# Patient Record
Sex: Female | Born: 1935
Health system: Southern US, Community
[De-identification: ages and names within clinical notes are randomized; demographics above are authoritative.]

## PROBLEM LIST (undated history)

## (undated) DIAGNOSIS — K8020 Calculus of gallbladder without cholecystitis without obstruction: Secondary | ICD-10-CM

## (undated) DIAGNOSIS — I75029 Atheroembolism of unspecified lower extremity: Secondary | ICD-10-CM

## (undated) DIAGNOSIS — F329 Major depressive disorder, single episode, unspecified: Secondary | ICD-10-CM

## (undated) DIAGNOSIS — I639 Cerebral infarction, unspecified: Secondary | ICD-10-CM

## (undated) DIAGNOSIS — E785 Hyperlipidemia, unspecified: Secondary | ICD-10-CM

## (undated) DIAGNOSIS — M1990 Unspecified osteoarthritis, unspecified site: Secondary | ICD-10-CM

## (undated) DIAGNOSIS — C169 Malignant neoplasm of stomach, unspecified: Secondary | ICD-10-CM

## (undated) DIAGNOSIS — I6529 Occlusion and stenosis of unspecified carotid artery: Secondary | ICD-10-CM

## (undated) DIAGNOSIS — E041 Nontoxic single thyroid nodule: Secondary | ICD-10-CM

## (undated) DIAGNOSIS — K449 Diaphragmatic hernia without obstruction or gangrene: Secondary | ICD-10-CM

## (undated) DIAGNOSIS — M79606 Pain in leg, unspecified: Secondary | ICD-10-CM

## (undated) DIAGNOSIS — R011 Cardiac murmur, unspecified: Secondary | ICD-10-CM

## (undated) DIAGNOSIS — I6521 Occlusion and stenosis of right carotid artery: Secondary | ICD-10-CM

## (undated) DIAGNOSIS — F419 Anxiety disorder, unspecified: Secondary | ICD-10-CM

## (undated) DIAGNOSIS — K219 Gastro-esophageal reflux disease without esophagitis: Secondary | ICD-10-CM

## (undated) DIAGNOSIS — H539 Unspecified visual disturbance: Secondary | ICD-10-CM

## (undated) DIAGNOSIS — C801 Malignant (primary) neoplasm, unspecified: Secondary | ICD-10-CM

## (undated) DIAGNOSIS — I10 Essential (primary) hypertension: Secondary | ICD-10-CM

## (undated) DIAGNOSIS — N183 Chronic kidney disease, stage 3 (moderate): Secondary | ICD-10-CM

## (undated) DIAGNOSIS — Z8619 Personal history of other infectious and parasitic diseases: Secondary | ICD-10-CM

## (undated) DIAGNOSIS — IMO0002 Reserved for concepts with insufficient information to code with codable children: Secondary | ICD-10-CM

## (undated) DIAGNOSIS — M8580 Other specified disorders of bone density and structure, unspecified site: Secondary | ICD-10-CM

## (undated) DIAGNOSIS — K649 Unspecified hemorrhoids: Secondary | ICD-10-CM

## (undated) DIAGNOSIS — E119 Type 2 diabetes mellitus without complications: Secondary | ICD-10-CM

## (undated) DIAGNOSIS — R32 Unspecified urinary incontinence: Secondary | ICD-10-CM

## (undated) DIAGNOSIS — E042 Nontoxic multinodular goiter: Secondary | ICD-10-CM

## (undated) DIAGNOSIS — I2510 Atherosclerotic heart disease of native coronary artery without angina pectoris: Secondary | ICD-10-CM

## (undated) DIAGNOSIS — I219 Acute myocardial infarction, unspecified: Secondary | ICD-10-CM

## (undated) DIAGNOSIS — K279 Peptic ulcer, site unspecified, unspecified as acute or chronic, without hemorrhage or perforation: Secondary | ICD-10-CM

## (undated) HISTORY — PX: EYE SURGERY: SHX253

## (undated) HISTORY — PX: CATARACT EXTRACTION: SUR2

## (undated) HISTORY — PX: DILATION AND CURETTAGE OF UTERUS: SHX78

## (undated) HISTORY — DX: Cardiac murmur, unspecified: R01.1

## (undated) HISTORY — DX: Atheroembolism of unspecified lower extremity: I75.029

## (undated) HISTORY — DX: Essential (primary) hypertension: I10

## (undated) HISTORY — PX: SQUAMOUS CELL CARCINOMA EXCISION: SHX2433

## (undated) HISTORY — DX: Type 2 diabetes mellitus without complications: E11.9

## (undated) HISTORY — PX: INCISIONAL HERNIA REPAIR: SHX193

## (undated) HISTORY — DX: Nontoxic single thyroid nodule: E04.1

## (undated) HISTORY — PX: OTHER SURGICAL HISTORY: SHX169

## (undated) HISTORY — DX: Diaphragmatic hernia without obstruction or gangrene: K44.9

## (undated) HISTORY — DX: Malignant neoplasm of stomach, unspecified: C16.9

## (undated) HISTORY — PX: PTCA: SHX146

## (undated) HISTORY — DX: Malignant (primary) neoplasm, unspecified: C80.1

## (undated) HISTORY — PX: APPENDECTOMY: SHX54

## (undated) HISTORY — DX: Peptic ulcer, site unspecified, unspecified as acute or chronic, without hemorrhage or perforation: K27.9

## (undated) HISTORY — PX: TONSILLECTOMY: SUR1361

## (undated) HISTORY — DX: Chronic kidney disease, stage 3 (moderate): N18.3

## (undated) HISTORY — DX: Other specified disorders of bone density and structure, unspecified site: M85.80

## (undated) HISTORY — DX: Gastro-esophageal reflux disease without esophagitis: K21.9

## (undated) HISTORY — PX: INGUINAL HERNIA REPAIR: SUR1180

## (undated) HISTORY — DX: Cerebral infarction, unspecified: I63.9

## (undated) HISTORY — DX: Occlusion and stenosis of right carotid artery: I65.21

## (undated) HISTORY — DX: Occlusion and stenosis of unspecified carotid artery: I65.29

## (undated) HISTORY — DX: Personal history of other infectious and parasitic diseases: Z86.19

## (undated) HISTORY — DX: Unspecified visual disturbance: H53.9

## (undated) HISTORY — PX: HEMORRHOID SURGERY: SHX153

## (undated) HISTORY — DX: Acute myocardial infarction, unspecified: I21.9

## (undated) HISTORY — DX: Atherosclerotic heart disease of native coronary artery without angina pectoris: I25.10

## (undated) HISTORY — DX: Calculus of gallbladder without cholecystitis without obstruction: K80.20

## (undated) HISTORY — DX: Unspecified osteoarthritis, unspecified site: M19.90

## (undated) HISTORY — DX: Reserved for concepts with insufficient information to code with codable children: IMO0002

## (undated) HISTORY — DX: Unspecified hemorrhoids: K64.9

## (undated) HISTORY — PX: ENDOMETRIAL BIOPSY: SHX622

## (undated) HISTORY — DX: Hyperlipidemia, unspecified: E78.5

## (undated) HISTORY — DX: Pain in leg, unspecified: M79.606

## (undated) HISTORY — DX: Major depressive disorder, single episode, unspecified: F32.9

## (undated) HISTORY — DX: Nontoxic multinodular goiter: E04.2

---

## 1995-03-15 HISTORY — PX: CORONARY ANGIOPLASTY: SHX604

## 1997-05-14 ENCOUNTER — Encounter (HOSPITAL_COMMUNITY): Admission: RE | Admit: 1997-05-14 | Discharge: 1997-08-12 | Payer: Self-pay | Admitting: Cardiovascular Disease

## 1997-05-23 ENCOUNTER — Ambulatory Visit (HOSPITAL_COMMUNITY): Admission: RE | Admit: 1997-05-23 | Discharge: 1997-05-23 | Payer: Self-pay

## 1997-09-04 ENCOUNTER — Ambulatory Visit (HOSPITAL_COMMUNITY): Admission: RE | Admit: 1997-09-04 | Discharge: 1997-09-04 | Payer: Self-pay | Admitting: Orthopaedic Surgery

## 1997-09-16 ENCOUNTER — Encounter: Admission: RE | Admit: 1997-09-16 | Discharge: 1997-12-15 | Payer: Self-pay | Admitting: Orthopaedic Surgery

## 1997-10-05 ENCOUNTER — Encounter: Admission: RE | Admit: 1997-10-05 | Discharge: 1998-01-03 | Payer: Self-pay | Admitting: Radiation Oncology

## 1998-02-11 HISTORY — PX: CORONARY STENT PLACEMENT: SHX1402

## 1998-05-02 ENCOUNTER — Ambulatory Visit (HOSPITAL_COMMUNITY): Admission: RE | Admit: 1998-05-02 | Discharge: 1998-05-02 | Payer: Self-pay | Admitting: Obstetrics and Gynecology

## 1998-05-02 ENCOUNTER — Encounter: Payer: Self-pay | Admitting: Obstetrics and Gynecology

## 1998-05-04 ENCOUNTER — Other Ambulatory Visit: Admission: RE | Admit: 1998-05-04 | Discharge: 1998-05-04 | Payer: Self-pay | Admitting: Obstetrics and Gynecology

## 1998-05-15 ENCOUNTER — Observation Stay (HOSPITAL_COMMUNITY): Admission: AD | Admit: 1998-05-15 | Discharge: 1998-05-16 | Payer: Self-pay | Admitting: Cardiology

## 1998-05-15 ENCOUNTER — Ambulatory Visit (HOSPITAL_BASED_OUTPATIENT_CLINIC_OR_DEPARTMENT_OTHER): Admission: RE | Admit: 1998-05-15 | Discharge: 1998-05-15 | Payer: Self-pay | Admitting: Plastic Surgery

## 1998-05-19 ENCOUNTER — Observation Stay (HOSPITAL_COMMUNITY): Admission: AD | Admit: 1998-05-19 | Discharge: 1998-05-20 | Payer: Self-pay | Admitting: Cardiology

## 1998-05-19 HISTORY — PX: CORONARY ANGIOPLASTY WITH STENT PLACEMENT: SHX49

## 1998-05-30 ENCOUNTER — Encounter (HOSPITAL_COMMUNITY): Admission: RE | Admit: 1998-05-30 | Discharge: 1998-08-28 | Payer: Self-pay | Admitting: Cardiology

## 1998-09-12 ENCOUNTER — Encounter (HOSPITAL_COMMUNITY): Admission: RE | Admit: 1998-09-12 | Discharge: 1998-12-11 | Payer: Self-pay | Admitting: Cardiology

## 1998-12-15 ENCOUNTER — Encounter (INDEPENDENT_AMBULATORY_CARE_PROVIDER_SITE_OTHER): Payer: Self-pay

## 1998-12-15 ENCOUNTER — Other Ambulatory Visit: Admission: RE | Admit: 1998-12-15 | Discharge: 1998-12-15 | Payer: Self-pay | Admitting: Obstetrics and Gynecology

## 1999-06-11 ENCOUNTER — Encounter: Payer: Self-pay | Admitting: Obstetrics and Gynecology

## 1999-06-11 ENCOUNTER — Ambulatory Visit (HOSPITAL_COMMUNITY): Admission: RE | Admit: 1999-06-11 | Discharge: 1999-06-11 | Payer: Self-pay | Admitting: Obstetrics and Gynecology

## 1999-06-12 ENCOUNTER — Encounter: Admission: RE | Admit: 1999-06-12 | Discharge: 1999-06-12 | Payer: Self-pay | Admitting: Obstetrics and Gynecology

## 1999-06-12 ENCOUNTER — Encounter: Payer: Self-pay | Admitting: Obstetrics and Gynecology

## 1999-06-20 ENCOUNTER — Encounter: Payer: Self-pay | Admitting: Obstetrics and Gynecology

## 1999-06-20 ENCOUNTER — Encounter: Admission: RE | Admit: 1999-06-20 | Discharge: 1999-06-20 | Payer: Self-pay | Admitting: Obstetrics and Gynecology

## 1999-06-21 ENCOUNTER — Other Ambulatory Visit: Admission: RE | Admit: 1999-06-21 | Discharge: 1999-06-21 | Payer: Self-pay | Admitting: Obstetrics and Gynecology

## 1999-07-02 ENCOUNTER — Encounter: Payer: Self-pay | Admitting: Family Medicine

## 1999-07-02 ENCOUNTER — Ambulatory Visit: Admission: RE | Admit: 1999-07-02 | Discharge: 1999-07-02 | Payer: Self-pay | Admitting: Family Medicine

## 1999-08-24 ENCOUNTER — Encounter: Payer: Self-pay | Admitting: Obstetrics and Gynecology

## 1999-08-28 ENCOUNTER — Ambulatory Visit (HOSPITAL_COMMUNITY): Admission: RE | Admit: 1999-08-28 | Discharge: 1999-08-28 | Payer: Self-pay | Admitting: Obstetrics and Gynecology

## 1999-08-28 ENCOUNTER — Encounter (INDEPENDENT_AMBULATORY_CARE_PROVIDER_SITE_OTHER): Payer: Self-pay | Admitting: Specialist

## 1999-10-27 ENCOUNTER — Ambulatory Visit (HOSPITAL_COMMUNITY): Admission: RE | Admit: 1999-10-27 | Discharge: 1999-10-27 | Payer: Self-pay | Admitting: Family Medicine

## 1999-10-27 ENCOUNTER — Encounter: Payer: Self-pay | Admitting: Family Medicine

## 2000-01-16 ENCOUNTER — Encounter: Admission: RE | Admit: 2000-01-16 | Discharge: 2000-01-16 | Payer: Self-pay | Admitting: Neurosurgery

## 2000-01-16 ENCOUNTER — Encounter: Payer: Self-pay | Admitting: Neurosurgery

## 2000-01-18 ENCOUNTER — Encounter: Payer: Self-pay | Admitting: General Surgery

## 2000-01-18 ENCOUNTER — Ambulatory Visit (HOSPITAL_COMMUNITY): Admission: RE | Admit: 2000-01-18 | Discharge: 2000-01-18 | Payer: Self-pay | Admitting: General Surgery

## 2000-02-27 ENCOUNTER — Ambulatory Visit (HOSPITAL_COMMUNITY): Admission: RE | Admit: 2000-02-27 | Discharge: 2000-02-28 | Payer: Self-pay | Admitting: Surgery

## 2000-05-18 ENCOUNTER — Ambulatory Visit (HOSPITAL_COMMUNITY): Admission: RE | Admit: 2000-05-18 | Discharge: 2000-05-18 | Payer: Self-pay | Admitting: Neurosurgery

## 2000-05-18 ENCOUNTER — Encounter: Payer: Self-pay | Admitting: Neurosurgery

## 2000-06-02 ENCOUNTER — Encounter: Admission: RE | Admit: 2000-06-02 | Discharge: 2000-06-02 | Payer: Self-pay | Admitting: Neurosurgery

## 2000-06-02 ENCOUNTER — Encounter: Payer: Self-pay | Admitting: Neurosurgery

## 2000-06-18 ENCOUNTER — Encounter: Payer: Self-pay | Admitting: Neurosurgery

## 2000-06-18 ENCOUNTER — Encounter: Admission: RE | Admit: 2000-06-18 | Discharge: 2000-06-18 | Payer: Self-pay | Admitting: Neurosurgery

## 2000-07-01 ENCOUNTER — Encounter: Payer: Self-pay | Admitting: Neurosurgery

## 2000-07-01 ENCOUNTER — Encounter: Admission: RE | Admit: 2000-07-01 | Discharge: 2000-07-01 | Payer: Self-pay | Admitting: Neurosurgery

## 2000-07-23 ENCOUNTER — Ambulatory Visit (HOSPITAL_COMMUNITY): Admission: RE | Admit: 2000-07-23 | Discharge: 2000-07-23 | Payer: Self-pay | Admitting: Obstetrics and Gynecology

## 2000-07-23 ENCOUNTER — Encounter: Payer: Self-pay | Admitting: Obstetrics and Gynecology

## 2000-07-24 ENCOUNTER — Other Ambulatory Visit: Admission: RE | Admit: 2000-07-24 | Discharge: 2000-07-24 | Payer: Self-pay | Admitting: Obstetrics and Gynecology

## 2000-09-17 ENCOUNTER — Ambulatory Visit (HOSPITAL_COMMUNITY): Admission: RE | Admit: 2000-09-17 | Discharge: 2000-09-17 | Payer: Self-pay | Admitting: Obstetrics and Gynecology

## 2000-09-17 ENCOUNTER — Encounter: Payer: Self-pay | Admitting: Obstetrics and Gynecology

## 2001-07-27 ENCOUNTER — Ambulatory Visit (HOSPITAL_COMMUNITY): Admission: RE | Admit: 2001-07-27 | Discharge: 2001-07-27 | Payer: Self-pay | Admitting: Obstetrics and Gynecology

## 2001-07-27 ENCOUNTER — Encounter: Payer: Self-pay | Admitting: Obstetrics and Gynecology

## 2001-07-28 ENCOUNTER — Encounter: Payer: Self-pay | Admitting: Obstetrics and Gynecology

## 2001-07-28 ENCOUNTER — Encounter: Admission: RE | Admit: 2001-07-28 | Discharge: 2001-07-28 | Payer: Self-pay | Admitting: Obstetrics and Gynecology

## 2001-08-03 ENCOUNTER — Other Ambulatory Visit: Admission: RE | Admit: 2001-08-03 | Discharge: 2001-08-03 | Payer: Self-pay | Admitting: Obstetrics and Gynecology

## 2001-09-23 ENCOUNTER — Encounter: Payer: Self-pay | Admitting: Surgery

## 2001-09-28 ENCOUNTER — Ambulatory Visit (HOSPITAL_COMMUNITY): Admission: RE | Admit: 2001-09-28 | Discharge: 2001-09-29 | Payer: Self-pay | Admitting: Surgery

## 2001-11-19 ENCOUNTER — Encounter: Payer: Self-pay | Admitting: Gastroenterology

## 2002-04-13 ENCOUNTER — Ambulatory Visit (HOSPITAL_COMMUNITY): Admission: RE | Admit: 2002-04-13 | Discharge: 2002-04-13 | Payer: Self-pay | Admitting: *Deleted

## 2002-04-13 ENCOUNTER — Encounter: Payer: Self-pay | Admitting: *Deleted

## 2002-08-19 ENCOUNTER — Encounter: Payer: Self-pay | Admitting: Obstetrics and Gynecology

## 2002-08-19 ENCOUNTER — Encounter: Admission: RE | Admit: 2002-08-19 | Discharge: 2002-08-19 | Payer: Self-pay | Admitting: Obstetrics and Gynecology

## 2002-08-30 ENCOUNTER — Other Ambulatory Visit: Admission: RE | Admit: 2002-08-30 | Discharge: 2002-08-30 | Payer: Self-pay | Admitting: Obstetrics and Gynecology

## 2002-11-02 ENCOUNTER — Encounter: Payer: Self-pay | Admitting: Family Medicine

## 2002-11-02 ENCOUNTER — Ambulatory Visit (HOSPITAL_COMMUNITY): Admission: RE | Admit: 2002-11-02 | Discharge: 2002-11-02 | Payer: Self-pay | Admitting: Family Medicine

## 2003-04-22 ENCOUNTER — Ambulatory Visit (HOSPITAL_COMMUNITY): Admission: RE | Admit: 2003-04-22 | Discharge: 2003-04-22 | Payer: Self-pay | Admitting: Cardiology

## 2003-04-25 ENCOUNTER — Ambulatory Visit (HOSPITAL_COMMUNITY): Admission: RE | Admit: 2003-04-25 | Discharge: 2003-04-25 | Payer: Self-pay | Admitting: Cardiology

## 2003-04-25 ENCOUNTER — Encounter: Payer: Self-pay | Admitting: Cardiology

## 2003-05-27 ENCOUNTER — Ambulatory Visit (HOSPITAL_COMMUNITY): Admission: RE | Admit: 2003-05-27 | Discharge: 2003-05-27 | Payer: Self-pay | Admitting: *Deleted

## 2003-09-08 ENCOUNTER — Other Ambulatory Visit: Admission: RE | Admit: 2003-09-08 | Discharge: 2003-09-08 | Payer: Self-pay | Admitting: Obstetrics and Gynecology

## 2003-10-26 ENCOUNTER — Encounter: Admission: RE | Admit: 2003-10-26 | Discharge: 2003-10-26 | Payer: Self-pay | Admitting: Obstetrics and Gynecology

## 2003-12-15 ENCOUNTER — Ambulatory Visit: Payer: Self-pay | Admitting: Cardiology

## 2003-12-29 HISTORY — PX: CATARACT EXTRACTION: SUR2

## 2004-03-30 ENCOUNTER — Encounter: Admission: AD | Admit: 2004-03-30 | Discharge: 2004-03-30 | Payer: Self-pay | Admitting: Dentistry

## 2004-03-30 ENCOUNTER — Ambulatory Visit: Payer: Self-pay | Admitting: Dentistry

## 2004-05-14 ENCOUNTER — Ambulatory Visit (HOSPITAL_COMMUNITY): Admission: RE | Admit: 2004-05-14 | Discharge: 2004-05-14 | Payer: Self-pay | Admitting: Family Medicine

## 2004-05-30 ENCOUNTER — Ambulatory Visit: Payer: Self-pay | Admitting: Cardiology

## 2004-06-07 ENCOUNTER — Ambulatory Visit: Payer: Self-pay

## 2004-06-08 ENCOUNTER — Ambulatory Visit: Payer: Self-pay | Admitting: Dentistry

## 2004-07-13 ENCOUNTER — Ambulatory Visit: Payer: Self-pay | Admitting: Cardiology

## 2004-07-27 ENCOUNTER — Ambulatory Visit: Payer: Self-pay | Admitting: Dentistry

## 2004-09-11 ENCOUNTER — Other Ambulatory Visit: Admission: RE | Admit: 2004-09-11 | Discharge: 2004-09-11 | Payer: Self-pay | Admitting: Obstetrics and Gynecology

## 2004-11-12 ENCOUNTER — Encounter: Admission: RE | Admit: 2004-11-12 | Discharge: 2004-11-12 | Payer: Self-pay | Admitting: Obstetrics and Gynecology

## 2004-12-11 ENCOUNTER — Ambulatory Visit: Payer: Self-pay | Admitting: Cardiology

## 2005-01-11 ENCOUNTER — Ambulatory Visit: Payer: Self-pay | Admitting: Dentistry

## 2005-01-18 ENCOUNTER — Ambulatory Visit: Payer: Self-pay | Admitting: Cardiology

## 2005-02-20 ENCOUNTER — Ambulatory Visit: Payer: Self-pay | Admitting: Dentistry

## 2005-03-20 ENCOUNTER — Ambulatory Visit: Payer: Self-pay | Admitting: Dentistry

## 2005-03-25 ENCOUNTER — Ambulatory Visit (HOSPITAL_COMMUNITY): Admission: RE | Admit: 2005-03-25 | Discharge: 2005-03-25 | Payer: Self-pay

## 2005-07-15 ENCOUNTER — Ambulatory Visit: Payer: Self-pay | Admitting: Cardiology

## 2005-08-09 ENCOUNTER — Ambulatory Visit: Payer: Self-pay | Admitting: Dentistry

## 2005-09-25 ENCOUNTER — Other Ambulatory Visit: Admission: RE | Admit: 2005-09-25 | Discharge: 2005-09-25 | Payer: Self-pay | Admitting: Obstetrics and Gynecology

## 2005-09-26 ENCOUNTER — Ambulatory Visit (HOSPITAL_COMMUNITY): Admission: RE | Admit: 2005-09-26 | Discharge: 2005-09-26 | Payer: Self-pay | Admitting: Obstetrics and Gynecology

## 2005-10-07 ENCOUNTER — Ambulatory Visit: Payer: Self-pay | Admitting: Dentistry

## 2005-11-26 ENCOUNTER — Ambulatory Visit (HOSPITAL_COMMUNITY): Admission: RE | Admit: 2005-11-26 | Discharge: 2005-11-26 | Payer: Self-pay | Admitting: Family Medicine

## 2006-01-01 ENCOUNTER — Encounter: Admission: RE | Admit: 2006-01-01 | Discharge: 2006-01-01 | Payer: Self-pay | Admitting: Obstetrics and Gynecology

## 2006-01-20 ENCOUNTER — Ambulatory Visit: Payer: Self-pay | Admitting: Cardiology

## 2006-01-31 ENCOUNTER — Ambulatory Visit: Payer: Self-pay | Admitting: Cardiovascular Disease

## 2006-01-31 ENCOUNTER — Ambulatory Visit: Payer: Self-pay

## 2006-04-15 ENCOUNTER — Ambulatory Visit: Payer: Self-pay | Admitting: Cardiology

## 2006-04-15 ENCOUNTER — Inpatient Hospital Stay (HOSPITAL_COMMUNITY): Admission: AD | Admit: 2006-04-15 | Discharge: 2006-04-18 | Payer: Self-pay | Admitting: Cardiology

## 2006-04-16 ENCOUNTER — Encounter: Payer: Self-pay | Admitting: Cardiology

## 2006-05-05 ENCOUNTER — Ambulatory Visit: Payer: Self-pay | Admitting: Cardiology

## 2006-05-08 ENCOUNTER — Ambulatory Visit: Payer: Self-pay | Admitting: *Deleted

## 2006-06-06 ENCOUNTER — Ambulatory Visit: Payer: Self-pay | Admitting: Dentistry

## 2006-08-06 ENCOUNTER — Ambulatory Visit: Payer: Self-pay | Admitting: Cardiology

## 2006-08-06 LAB — CONVERTED CEMR LAB
CO2: 32 meq/L (ref 19–32)
Calcium: 9.1 mg/dL (ref 8.4–10.5)
Chloride: 105 meq/L (ref 96–112)
Cholesterol: 122 mg/dL (ref 0–200)
Creatinine, Ser: 0.7 mg/dL (ref 0.4–1.2)
GFR calc non Af Amer: 86 mL/min
Glucose, Bld: 78 mg/dL (ref 70–99)
HDL: 47.3 mg/dL (ref >39.0)
LDL Cholesterol: 52 mg/dL (ref 0–99)

## 2006-09-30 ENCOUNTER — Ambulatory Visit: Payer: Self-pay | Admitting: Gastroenterology

## 2006-10-07 ENCOUNTER — Ambulatory Visit (HOSPITAL_COMMUNITY): Admission: RE | Admit: 2006-10-07 | Discharge: 2006-10-07 | Payer: Self-pay | Admitting: Gastroenterology

## 2006-10-14 ENCOUNTER — Ambulatory Visit: Payer: Self-pay | Admitting: Vascular Surgery

## 2006-11-18 ENCOUNTER — Ambulatory Visit: Payer: Self-pay | Admitting: Gastroenterology

## 2006-11-18 DIAGNOSIS — K649 Unspecified hemorrhoids: Secondary | ICD-10-CM | POA: Insufficient documentation

## 2006-12-04 ENCOUNTER — Ambulatory Visit: Payer: Self-pay | Admitting: Cardiology

## 2007-01-07 ENCOUNTER — Encounter: Admission: RE | Admit: 2007-01-07 | Discharge: 2007-01-07 | Payer: Self-pay | Admitting: Obstetrics and Gynecology

## 2007-02-02 ENCOUNTER — Ambulatory Visit: Payer: Self-pay

## 2007-03-18 ENCOUNTER — Ambulatory Visit: Payer: Self-pay | Admitting: Dentistry

## 2007-04-30 ENCOUNTER — Ambulatory Visit: Payer: Self-pay | Admitting: *Deleted

## 2007-05-16 DIAGNOSIS — I10 Essential (primary) hypertension: Secondary | ICD-10-CM | POA: Insufficient documentation

## 2007-05-16 DIAGNOSIS — M949 Disorder of cartilage, unspecified: Secondary | ICD-10-CM

## 2007-05-16 DIAGNOSIS — M899 Disorder of bone, unspecified: Secondary | ICD-10-CM | POA: Insufficient documentation

## 2007-05-16 DIAGNOSIS — I252 Old myocardial infarction: Secondary | ICD-10-CM

## 2007-05-16 DIAGNOSIS — M129 Arthropathy, unspecified: Secondary | ICD-10-CM | POA: Insufficient documentation

## 2007-05-16 DIAGNOSIS — I2510 Atherosclerotic heart disease of native coronary artery without angina pectoris: Secondary | ICD-10-CM | POA: Insufficient documentation

## 2007-05-16 DIAGNOSIS — E119 Type 2 diabetes mellitus without complications: Secondary | ICD-10-CM

## 2007-05-16 DIAGNOSIS — I6529 Occlusion and stenosis of unspecified carotid artery: Secondary | ICD-10-CM | POA: Insufficient documentation

## 2007-05-18 ENCOUNTER — Ambulatory Visit (HOSPITAL_COMMUNITY): Admission: RE | Admit: 2007-05-18 | Discharge: 2007-05-18 | Payer: Self-pay | Admitting: Family Medicine

## 2007-06-03 ENCOUNTER — Ambulatory Visit: Payer: Self-pay | Admitting: Cardiology

## 2007-10-22 ENCOUNTER — Ambulatory Visit: Payer: Self-pay | Admitting: *Deleted

## 2007-12-28 ENCOUNTER — Ambulatory Visit: Payer: Self-pay | Admitting: Cardiology

## 2008-01-13 ENCOUNTER — Ambulatory Visit (HOSPITAL_COMMUNITY): Admission: RE | Admit: 2008-01-13 | Discharge: 2008-01-13 | Payer: Self-pay | Admitting: Family Medicine

## 2008-01-19 ENCOUNTER — Ambulatory Visit: Payer: Self-pay

## 2008-03-08 ENCOUNTER — Encounter: Admission: RE | Admit: 2008-03-08 | Discharge: 2008-03-08 | Payer: Self-pay | Admitting: Obstetrics and Gynecology

## 2008-03-22 ENCOUNTER — Other Ambulatory Visit: Admission: RE | Admit: 2008-03-22 | Discharge: 2008-03-22 | Payer: Self-pay | Admitting: Obstetrics and Gynecology

## 2008-03-30 ENCOUNTER — Ambulatory Visit: Payer: Self-pay | Admitting: Dentistry

## 2008-06-09 ENCOUNTER — Ambulatory Visit: Payer: Self-pay | Admitting: *Deleted

## 2008-06-17 ENCOUNTER — Encounter: Payer: Self-pay | Admitting: Cardiology

## 2008-06-18 DIAGNOSIS — Z9849 Cataract extraction status, unspecified eye: Secondary | ICD-10-CM

## 2008-06-18 DIAGNOSIS — I701 Atherosclerosis of renal artery: Secondary | ICD-10-CM | POA: Insufficient documentation

## 2008-06-18 DIAGNOSIS — Z9861 Coronary angioplasty status: Secondary | ICD-10-CM

## 2008-06-18 DIAGNOSIS — E785 Hyperlipidemia, unspecified: Secondary | ICD-10-CM | POA: Insufficient documentation

## 2008-06-18 DIAGNOSIS — Z8711 Personal history of peptic ulcer disease: Secondary | ICD-10-CM

## 2008-06-18 DIAGNOSIS — K219 Gastro-esophageal reflux disease without esophagitis: Secondary | ICD-10-CM

## 2008-06-18 DIAGNOSIS — Z9889 Other specified postprocedural states: Secondary | ICD-10-CM

## 2008-06-18 DIAGNOSIS — Z9089 Acquired absence of other organs: Secondary | ICD-10-CM

## 2008-06-20 ENCOUNTER — Ambulatory Visit: Payer: Self-pay | Admitting: Cardiology

## 2008-10-04 ENCOUNTER — Encounter: Payer: Self-pay | Admitting: Cardiology

## 2008-10-10 ENCOUNTER — Ambulatory Visit (HOSPITAL_COMMUNITY): Admission: RE | Admit: 2008-10-10 | Discharge: 2008-10-10 | Payer: Self-pay | Admitting: Family Medicine

## 2008-10-10 ENCOUNTER — Encounter: Payer: Self-pay | Admitting: Cardiology

## 2008-11-07 ENCOUNTER — Ambulatory Visit: Payer: Self-pay | Admitting: Surgery

## 2008-11-07 ENCOUNTER — Encounter: Payer: Self-pay | Admitting: Cardiology

## 2008-12-26 ENCOUNTER — Encounter (INDEPENDENT_AMBULATORY_CARE_PROVIDER_SITE_OTHER): Payer: Self-pay | Admitting: *Deleted

## 2009-01-03 ENCOUNTER — Ambulatory Visit: Payer: Self-pay | Admitting: Cardiology

## 2009-01-25 ENCOUNTER — Ambulatory Visit: Payer: Self-pay

## 2009-01-25 ENCOUNTER — Encounter: Payer: Self-pay | Admitting: Cardiology

## 2009-03-20 ENCOUNTER — Encounter: Admission: RE | Admit: 2009-03-20 | Discharge: 2009-03-20 | Payer: Self-pay | Admitting: Obstetrics and Gynecology

## 2009-03-23 ENCOUNTER — Other Ambulatory Visit: Admission: RE | Admit: 2009-03-23 | Discharge: 2009-03-23 | Payer: Self-pay | Admitting: Obstetrics and Gynecology

## 2009-05-01 ENCOUNTER — Ambulatory Visit: Payer: Self-pay | Admitting: Surgery

## 2009-09-14 ENCOUNTER — Ambulatory Visit (HOSPITAL_COMMUNITY): Admission: RE | Admit: 2009-09-14 | Discharge: 2009-09-14 | Payer: Self-pay | Admitting: Legal Medicine

## 2009-09-14 ENCOUNTER — Encounter (INDEPENDENT_AMBULATORY_CARE_PROVIDER_SITE_OTHER): Payer: Self-pay | Admitting: Family Medicine

## 2009-09-14 ENCOUNTER — Ambulatory Visit: Payer: Self-pay | Admitting: Surgery

## 2009-10-02 ENCOUNTER — Telehealth: Payer: Self-pay | Admitting: Cardiology

## 2009-10-03 ENCOUNTER — Ambulatory Visit (HOSPITAL_COMMUNITY): Admission: RE | Admit: 2009-10-03 | Discharge: 2009-10-03 | Payer: Self-pay | Admitting: Legal Medicine

## 2009-10-03 ENCOUNTER — Telehealth (INDEPENDENT_AMBULATORY_CARE_PROVIDER_SITE_OTHER): Payer: Self-pay | Admitting: *Deleted

## 2009-10-19 ENCOUNTER — Ambulatory Visit (HOSPITAL_COMMUNITY)
Admission: RE | Admit: 2009-10-19 | Discharge: 2009-10-19 | Payer: Self-pay | Source: Home / Self Care | Admitting: Legal Medicine

## 2009-10-23 ENCOUNTER — Ambulatory Visit: Payer: Self-pay | Admitting: Cardiology

## 2009-11-27 ENCOUNTER — Ambulatory Visit: Payer: Self-pay | Admitting: Surgery

## 2009-11-27 ENCOUNTER — Encounter: Payer: Self-pay | Admitting: Cardiology

## 2010-02-08 ENCOUNTER — Ambulatory Visit (HOSPITAL_COMMUNITY)
Admission: RE | Admit: 2010-02-08 | Discharge: 2010-02-08 | Payer: Self-pay | Source: Home / Self Care | Attending: Legal Medicine | Admitting: Legal Medicine

## 2010-03-15 NOTE — Assessment & Plan Note (Signed)
Summary: f5m   Visit Type:  6  mo f/u Primary Provider:  Reinaldo Meeker  CC:  edema/ankles...no other complaints today.  History of Present Illness: Still volunteering at cardiac rehab.   Staying active, and feeling well, except energy level is low.  No chest pain, or shortness of breath.  Current Medications (verified): 1)  Ramipril 2.5 Mg Caps (Ramipril) .... Take One Capsule By Mouth Twice Daily. 2)  Metoprolol Succinate 25 Mg Xr24h-Tab (Metoprolol Succinate) .... Take One Tablet By Mouth Daily 3)  Crestor 10 Mg Tabs (Rosuvastatin Calcium) .... Take One Tablet By Mouth Daily. 4)  Hydrochlorothiazide 25 Mg Tabs (Hydrochlorothiazide) .... Take One Half Tablet By Mouth Daily. 5)  Plavix 75 Mg Tabs (Clopidogrel Bisulfate) .Marland Kitchen.. 1 Tab Once Daily 6)  Metformin Hcl 500 Mg Tabs (Metformin Hcl) .... Tke Two Tablets Two Times A Day 7)  Cyclobenzaprine Hcl 5 Mg Tabs (Cyclobenzaprine Hcl) .... Take One Tablet Daily Up To 3 Daily As Needed 8)  Zoloft 25 Mg Tabs (Sertraline Hcl) .... Take 1 Tablet By Mouth Once A Day 9)  Nexium 40 Mg Cpdr (Esomeprazole Magnesium) .... Take 1 Capsule By Mouth Once A Day 10)  Premarin 0.625 Mg/gm Crea (Estrogens, Conjugated) .... Apply Weekly 11)  Coq10 100 Mg Caps (Coenzyme Q10) .... Take 1 Capsule By Mouth Once A Day 12)  Aspirin Ec 325 Mg Tbec (Aspirin) .... Take One Tablet By Mouth Daily 13)  Vitamin B-12 500 Mcg  Tabs (Cyanocobalamin) .... Take 1 Tablet By Mouth Two  Times A Day 14)  Centrum Silver  Chew (Multiple Vitamins-Minerals) .... Once Daily 15)  Nitrostat 0.4 Mg Subl (Nitroglycerin) .Marland Kitchen.. 1 Tablet Under Tongue At Onset of Chest Pain; You May Repeat Every 5 Minutes For Up To 3 Doses. 16)  Ciclopirox 8 % Soln (Ciclopirox) .... As Needed 17)  Vitamin D3 400 Unit Tabs (Cholecalciferol) .... Take 1 and 1/2 Tablet Daily  Allergies: 1)  ! Sulfa 2)  ! * Tranalast Study Drug 3)  ! * Contrast Dye  Past History:  Past Medical History: Last updated:  06/18/2008 Current Problems:  ATHEROEMBOLISM, HX OF "BLUE TOES" (ICD-445.89) MYOCARDIAL INFARCTION, ANTERIOR WALL, HX OF (ICD-410.10) PEPTIC ULCER DISEASE, HX OF (ICD-V12.71) GASTROESOPHAGEAL REFLUX DISEASE (ICD-530.81) HYPERLIPIDEMIA (ICD-272.4) RENAL ARTERY STENOSIS (ICD-440.1) HEMORRHOIDS (ICD-455.6) OSTEOPENIA (ICD-733.90) DM (ICD-250.00) CAROTID ARTERY DISEASE (ICD-433.10) ARTHRITIS (ICD-716.90) MYOCARDIAL INFARCTION, HX OF (ICD-412) CAD (ICD-414.00) HYPERTENSION (ICD-401.9)    Past Surgical History: Last updated: 06/18/2008 CATARACT EXTRACTIONS, BILATERAL, HX OF (ICD-V45.61) TONSILLECTOMY, HX OF (ICD-V45.79) PERCUTANEOUS TRANSLUMINAL CORONARY ANGIOPLASTY, HX OF (ICD-V45.82) HEMORRHOIDECTOMY, HX OF (ICD-V45.89) DILATION AND CURETTAGE, HX OF (ICD-V45.89) INGUINAL HERNIORRHAPHY, RIGHT, HX OF (ICD-V45.89) APPENDECTOMY, HX OF (ICD-V45.79)  Family History: Last updated: 06/18/2008 Family History of Diabetes: father Family History of Hypertension:  paternal side of family  Social History: Last updated: 06/18/2008 Retired  Widowed   Vital Signs:  Patient profile:   77 year old female Height:      64 inches Weight:      146.4 pounds BMI:     25.22 Pulse rate:   71 / minute Pulse rhythm:   irregular BP sitting:   108 / 54  (left arm) Cuff size:   large  Vitals Entered By: Julaine Hua, CMA (October 23, 2009 3:24 PM)  Physical Exam  General:  Well developed, well nourished, in no acute distress. Head:  normocephalic and atraumatic Eyes:  PERRLA/EOM intact; conjunctiva and lids normal. Lungs:  Clear bilaterally to auscultation and percussion. Heart:  PMI non  displaced.  Minimal SEM.  No DM.  No rub or significant gallop. Abdomen:  Bowel sounds positive; abdomen soft and non-tender without masses, organomegaly, or hernias noted. No hepatosplenomegaly. Pulses:  pulses normal in all 4 extremities Extremities:  No evidence of atheroemboli. Neurologic:  Alert and  oriented x 3.   EKG  Procedure date:  10/23/2009  Findings:      NSR with sinus arrhythmia.  Low voltage QRS.  Inferior MI, old.  Anterolateral MI, old.  No acute changes.  Impression & Recommendations:  Problem # 1:  CAD (ICD-414.00) Continues to remain stable. Mild fatigue, but no specific symptoms.  Continue to observe, and encourage activity.  Her updated medication list for this problem includes:    Ramipril 2.5 Mg Caps (Ramipril) .Marland Kitchen... Take one capsule by mouth twice daily.    Metoprolol Succinate 25 Mg Xr24h-tab (Metoprolol succinate) .Marland Kitchen... Take one tablet by mouth daily    Plavix 75 Mg Tabs (Clopidogrel bisulfate) .Marland Kitchen... 1 tab once daily    Aspirin Ec 325 Mg Tbec (Aspirin) .Marland Kitchen... Take one tablet by mouth daily    Nitrostat 0.4 Mg Subl (Nitroglycerin) .Marland Kitchen... 1 tablet under tongue at onset of chest pain; you may repeat every 5 minutes for up to 3 doses.  Problem # 2:  ATHEROEMBOLISM, HX OF "BLUE TOES" (ICD-445.89) No evidence of this on exam at this point.   Problem # 3:  HYPERLIPIDEMIA (B2193296.4) Being followed by Dr. Henrene Pastor at this point in time. Labs recently done.   Her updated medication list for this problem includes:    Crestor 10 Mg Tabs (Rosuvastatin calcium) .Marland Kitchen... Take one tablet by mouth daily.  Problem # 4:  RENAL ARTERY STENOSIS (ICD-440.1) No abdominal bruits.  Will follow up at some point.    Other Orders: EKG w/ Interpretation (93000)  Patient Instructions: 1)  Your physician recommends that you schedule a follow-up appointment in: 6 months with Dr. Lia Foyer 2)  Your physician recommends that you continue on your current medications as directed. Please refer to the Current Medication list given to you today.

## 2010-03-15 NOTE — Progress Notes (Signed)
  Pt Signed ROI, Copied doppler gave to Pt  Acadia [place]  October 03, 2009 12:50 PM

## 2010-03-15 NOTE — Letter (Signed)
Summary: [facility] Office Visit   [facility] Visit   Imported By: Sallee Provencal 12/20/2009 16:06:40  _____________________________________________________________________  External Attachment:    Type:   Image     Comment:   External Document

## 2010-03-15 NOTE — Progress Notes (Signed)
Summary: Crestor  Phone Note Call from Patient Call back at [phone]   Caller: Stacy [name] Summary of Call: Pt daughter call regarding medication Initial call taken by: Delsa Sale,  October 02, 2009 11:43 AM  Follow-up for Phone Call        I spoke with the pt's daughter and the pt c/o aching in left leg on 09/20/09.  The pt stopped Crestor 09/22/09 and her ache went away on 09/26/09.  The pt has tried Zetia, Lescol, Lipitor, Pravachol and Zocor.  The pt is having right leg and hip pain and this is due to spinal stenosis.  The pt is having an arterial doppler next week.  I instructed Stacy [name] to have the pt start Crestor 5mg  next week after her arterial doppler.  If the pt tolerates the 5mg  after 1 WEEK she will increase Crestor to 10mg  daily. The pt's PCP checked her cholesterol in July (chol 131, trig 145, HDL 46, LDL 56).  The pt's daughter will call the office and let us know if her symptoms return after restarting Crestor.  Follow-up by: Theodosia Quay, RN, BSN,  October 02, 2009 3:16 PM

## 2010-04-10 ENCOUNTER — Encounter: Payer: Self-pay | Admitting: Cardiology

## 2010-05-04 ENCOUNTER — Telehealth: Payer: Self-pay | Admitting: Cardiology

## 2010-05-04 NOTE — Telephone Encounter (Signed)
Paged daughter at [phone] and put in our number--will wait until we hear from her--nt

## 2010-05-04 NOTE — Telephone Encounter (Signed)
Pt's daughter calling stating pt experienced pain in right side of chest(breast) radiating to right side of neck into shoulder blade--no SOB, not constant,no change in meds,has NTG but did not use,lasted from Wednesday thru Thursday, and has not comeback--has appoint with [place] 05/29/10--advised to keep appoint--if pain comes back try NTG to see if gets relief and go to nearest ED--DAUGHTER AGREES--NT

## 2010-05-04 NOTE — Telephone Encounter (Addendum)
pls page dtr anita at [phone] , 55p wed pt started having pain in right breast that shot through to right shoulder blade off and on for about 24 hrs, no pain now and has appt 4-14, dtr would like to talk with nurse  Pager not working-pls call [phone]

## 2010-05-10 ENCOUNTER — Other Ambulatory Visit (HOSPITAL_COMMUNITY): Payer: Self-pay | Admitting: Legal Medicine

## 2010-05-10 DIAGNOSIS — R1011 Right upper quadrant pain: Secondary | ICD-10-CM

## 2010-05-16 ENCOUNTER — Ambulatory Visit (HOSPITAL_COMMUNITY)
Admission: RE | Admit: 2010-05-16 | Discharge: 2010-05-16 | Disposition: A | Discharge status: Home / Self Care | Payer: Medicare Other | Source: Ambulatory Visit | Attending: Legal Medicine | Admitting: Legal Medicine

## 2010-05-16 ENCOUNTER — Other Ambulatory Visit (HOSPITAL_COMMUNITY): Payer: Self-pay

## 2010-05-16 DIAGNOSIS — K8020 Calculus of gallbladder without cholecystitis without obstruction: Secondary | ICD-10-CM | POA: Insufficient documentation

## 2010-05-16 DIAGNOSIS — R1011 Right upper quadrant pain: Secondary | ICD-10-CM

## 2010-05-16 DIAGNOSIS — K7689 Other specified diseases of liver: Secondary | ICD-10-CM | POA: Insufficient documentation

## 2010-05-21 ENCOUNTER — Other Ambulatory Visit (HOSPITAL_COMMUNITY): Payer: Self-pay | Admitting: Legal Medicine

## 2010-05-21 DIAGNOSIS — K7689 Other specified diseases of liver: Secondary | ICD-10-CM

## 2010-05-25 ENCOUNTER — Other Ambulatory Visit (HOSPITAL_COMMUNITY): Payer: BLUE CROSS/BLUE SHIELD

## 2010-05-26 ENCOUNTER — Encounter: Payer: Self-pay | Admitting: Cardiology

## 2010-05-29 ENCOUNTER — Ambulatory Visit (INDEPENDENT_AMBULATORY_CARE_PROVIDER_SITE_OTHER): Payer: Medicare Other | Admitting: Cardiology

## 2010-05-29 ENCOUNTER — Encounter: Payer: Self-pay | Admitting: Cardiology

## 2010-05-29 VITALS — BP 92/52 | HR 75 | Ht 64.0 in | Wt 139.0 lb

## 2010-05-29 DIAGNOSIS — I252 Old myocardial infarction: Secondary | ICD-10-CM

## 2010-05-29 DIAGNOSIS — I10 Essential (primary) hypertension: Secondary | ICD-10-CM

## 2010-05-29 DIAGNOSIS — E785 Hyperlipidemia, unspecified: Secondary | ICD-10-CM

## 2010-05-29 DIAGNOSIS — I6529 Occlusion and stenosis of unspecified carotid artery: Secondary | ICD-10-CM

## 2010-05-29 DIAGNOSIS — I2510 Atherosclerotic heart disease of native coronary artery without angina pectoris: Secondary | ICD-10-CM

## 2010-05-29 NOTE — Patient Instructions (Signed)
Your physician recommends that you continue on your current medications as directed. Please refer to the Current Medication list given to you today.  Your physician wants you to follow-up in: 6 MONTHS. You will receive a reminder letter in the mail two months in advance. If you don't receive a letter, please call our office to schedule the follow-up appointment.  

## 2010-05-30 ENCOUNTER — Ambulatory Visit (HOSPITAL_COMMUNITY)
Admission: RE | Admit: 2010-05-30 | Discharge: 2010-05-30 | Disposition: A | Discharge status: Home / Self Care | Payer: Medicare Other | Source: Ambulatory Visit | Attending: Legal Medicine | Admitting: Legal Medicine

## 2010-05-30 DIAGNOSIS — K8020 Calculus of gallbladder without cholecystitis without obstruction: Secondary | ICD-10-CM | POA: Insufficient documentation

## 2010-05-30 DIAGNOSIS — K7689 Other specified diseases of liver: Secondary | ICD-10-CM

## 2010-05-30 MED ORDER — GADOBENATE DIMEGLUMINE 529 MG/ML IV SOLN
15.0000 mL | Freq: Once | INTRAVENOUS | Status: AC | PRN
  Administered 2010-05-30: 15 mL via INTRAVENOUS

## 2010-06-07 ENCOUNTER — Telehealth: Payer: Self-pay | Admitting: Cardiology

## 2010-06-07 MED ORDER — CLOPIDOGREL BISULFATE 75 MG PO TABS: 75.0000 mg | ORAL_TABLET | Freq: Every day | ORAL | Status: DC

## 2010-06-07 NOTE — Assessment & Plan Note (Signed)
Controlled and not elevated.  We have done renal US in the past------for RAS.  BP not markedly elevated, and of note Cr was recently normal.

## 2010-06-07 NOTE — Telephone Encounter (Signed)
Pt calling needs her plavix to be called in. Pt is going out of town.  [facility] [place] # [phone]

## 2010-06-07 NOTE — Assessment & Plan Note (Signed)
No definite new symptoms. Therefore continue the same.

## 2010-06-07 NOTE — Progress Notes (Signed)
HPI:  Stacy [name] is seen today in follow up, accompanied by her daughter.  She is an 77 year old female with known CAD with prior PCI.  She perhaps does not have quite as much energy as in the past, but has not had rest or serious exertional symptoms.  She is scheduled for an MRI tomorrow.  Labs done by Dr. Henrene Pastor were ok.  LDL was at target, and HgA1c was 6.5%.  Current Outpatient Prescriptions  Medication Sig Dispense Refill  . ALPRAZolam (NIRAVAM) 0.25 MG dissolvable tablet Take 0.25 mg by mouth daily.        Marland Kitchen aspirin 325 MG tablet Take 325 mg by mouth daily.        . calcium-vitamin D (OSCAL WITH D) 500-200 MG-UNIT per tablet Take 1 tablet by mouth daily.        . cholecalciferol (VITAMIN D) 400 UNITS TABS Take 200 Units by mouth daily.        . Coenzyme Q10 (COQ-10 PO) 1 tab po qd       . cyanocobalamin 500 MCG tablet Take 500 mcg by mouth 2 (two) times daily.        . cyclobenzaprine (FLEXERIL) 5 MG tablet Take 5 mg by mouth as needed.        Marland Kitchen esomeprazole (NEXIUM) 40 MG capsule Take 40 mg by mouth daily before breakfast.        . estrogens, conjugated, (PREMARIN) 0.625 MG tablet Take 0.625 mg by mouth daily. Take daily for 21 days then do not take for 7 days.       Marland Kitchen FLUTICASONE PROPIONATE, INHAL, IN Inhale into the lungs as directed.        . hydrochlorothiazide 25 MG tablet 1/2 tab po qd       . loratadine (CLARITIN) 10 MG tablet Take 10 mg by mouth daily.        . metFORMIN (GLUCOPHAGE) 500 MG tablet Take 500 mg by mouth 2 (two) times daily with a meal.        . metoprolol succinate (TOPROL-XL) 25 MG 24 hr tablet Take 25 mg by mouth daily.        . Multiple Vitamins-Minerals (CENTRUM SILVER PO) 1 tab po qd       . nitroGLYCERIN (NITROSTAT) 0.4 MG SL tablet Place 0.4 mg under the tongue every 5 (five) minutes as needed.        . ramipril (ALTACE) 2.5 MG capsule Take 2.5 mg by mouth 2 (two) times daily.       . rosuvastatin (CRESTOR) 10 MG tablet Take 10 mg by mouth daily.        Marland Kitchen  DISCONTD: clopidogrel (PLAVIX) 75 MG tablet Take 75 mg by mouth daily.        . clopidogrel (PLAVIX) 75 MG tablet Take 1 tablet (75 mg total) by mouth daily.  30 tablet  11    Allergies  Allergen Reactions  . Iohexol      Desc: HIVES- 13 HR PRE-MEDS ARE REQUIRED-ASM- 03/21/05,SULFA,ADHESIVE TAPE   . Sulfonamide Derivatives     REACTION: hives    Past Medical History  Diagnosis Date  . Atheroembolism   . Myocardial infarction   . Peptic ulcer disease   . GERD (gastroesophageal reflux disease)   . Hyperlipidemia   . Renal artery stenosis   . Hemorrhoids   . Osteopenia   . DM (diabetes mellitus)   . CAD (coronary artery disease)   . Arthritis   .  Myocardial infarction   . CAD (coronary artery disease)   . Hypertension     Past Surgical History  Procedure Date  . Cataract extraction   . Tonsillectomy   . Ptca   . Appendectomy     Family History  Problem Relation Age of Onset  . Diabetes    . Hypertension      History   Social History  . Marital Status: Widowed    Spouse Name: N/A    Number of Children: N/A  . Years of Education: N/A   Occupational History  . Not on file.   Social History Main Topics  . Smoking status: Former Research scientist (life sciences)  . Smokeless tobacco: Not on file  . Alcohol Use: Not on file  . Drug Use: Not on file  . Sexually Active: Not on file   Other Topics Concern  . Not on file   Social History Narrative  . No narrative on file    ROS: Please see the HPI.  All other systems reviewed and negative.  PHYSICAL EXAM:  BP 92/52  Pulse 75  Ht 5\' 4"  (1.626 m)  Wt 139 lb (63.05 kg)  BMI 23.86 kg/m2  General: Well developed, well nourished, in no acute distress. Head:  Normocephalic and atraumatic. Neck: no JVD Lungs: Clear to auscultation and percussion. Heart: Normal S1 and S2.  No murmur, rubs or gallops.  Abdomen:  Normal bowel sounds; soft; non tender; no organomegaly Pulses: Pulses normal in all 4 extremities. Extremities: No  clubbing or cyanosis. No edema.  No atheroembolic. Neurologic: Alert and oriented x 3.  EKG:  NSR.  Inferior MI, old.  Anterior MI, old.  Incomplete RBBB.  ASSESSMENT AND PLAN:

## 2010-06-07 NOTE — Assessment & Plan Note (Signed)
Was seen at [facility].  Annual follow up was warranted for AAA, and also carotid.

## 2010-06-07 NOTE — Assessment & Plan Note (Signed)
At target.

## 2010-06-26 NOTE — Assessment & Plan Note (Signed)
OFFICE VISIT   [name], Stacy [name]  DOB:  March 20, 1926                                       06/09/2008  [id number]   The patient returned to the office today for continued carotid  surveillance.  Carotid Doppler carried out today.  This reveals a right  internal carotid artery stenosis in the 60% to 79% range, in the lower  aspect of this range.  Patent left internal carotid artery without  significant stenosis.   Remains on aspirin and Plavix.  No recent hospitalizations.  Denies  sensory, motor or visual deficit.  No speech problems.   PHYSICAL EXAMINATION:  A well-appearing 77 year old female.  Alert and  oriented.  BP is 113/72, pulse 64, respirations 18 per minute.  No  carotid bruits.  Normal heart sounds without murmurs.  Chest:  Clear.  Cranial nerves intact.  Strength equal bilaterally.  Reflexes 1+.   The patient continues to do well without significant progression of  carotid disease.  We will plan follow-up again in 6 months with carotid  Doppler and abdominal ultrasound.   Stacy [name], M.D.  Electronically Signed   PGH/MEDQ  D:  06/09/2008  T:  06/10/2008  Job:  [id number]   cc:   [name], M.D.

## 2010-06-26 NOTE — Procedures (Signed)
CAROTID DUPLEX EXAM   INDICATION:  Follow up carotid artery disease.   HISTORY:  Diabetes:  Yes.  Cardiac:  MI.  Hypertension:  Yes.  Smoking:  Quit in 2000.  Previous Surgery:  No.  CV History:  No.  Amaurosis Fugax No, Paresthesias No, Hemiparesis No.                                       RIGHT             LEFT  Brachial systolic pressure:         120               118  Brachial Doppler waveforms:         WNL               WNL  Vertebral direction of flow:        Antegrade         Antegrade  DUPLEX VELOCITIES (cm/sec)  CCA peak systolic                   108 mid, 153 distal                 88  ECA peak systolic                   166               91  ICA peak systolic                   365               124 distal  ICA end diastolic                   96                24  PLAQUE MORPHOLOGY:                  Heterogenous      Homogenous  PLAQUE AMOUNT:                      Moderate          Mild  PLAQUE LOCATION:                    ICA, ECA          ICA   IMPRESSION:  1. Right internal carotid artery suggests 60-79% stenosis.  2. Left internal carotid artery suggests 40-59% stenosis.  3. Antegrade flow in bilateral vertebrals.   ___________________________________________  V. Leia Alf, MD   CB/MEDQ  D:  05/01/2009  T:  05/02/2009  Job:  [id number]

## 2010-06-26 NOTE — Assessment & Plan Note (Signed)
[facility] OFFICE NOTE   Stacy [name], Stacy [name]                         MRN:          [id number]  DATE:06/03/2007                            DOB:          1927/01/17    Stacy [name] is in for a follow-up visit.  She is doing quite well.  She  denies any ongoing chest pain.  She has not had any further events since  last seen.  Since I last saw her, she did have a total body scan done  March 19, 2007.  She was thought to have mildly reduced left  ventricular systolic function and mild mitral valve regurgitation.  She  apparently had carotids and thyroid as well, and she has seen Dr. Debria Garret  in the interim.  Dopplers of the lower extremities were normal. Venous  extremities were unremarkable as well.  She even had a lung exam and a  pelvic ultrasound, all of which apparently were unremarkable.  She has  had laboratory studies done by Dr. Debria Garret.  This reveals a cholesterol of  130, HDL of 51, and LDL of 50.  Creatinine clearance has been done, and  most recent laboratory studies have been repeated, showing a glucose of  107.  TSHs were normal.  LDL cholesterol was 60, and HDL of 48.  She has  since seen Dr. Amedeo Plenty, and this was on April 30, 2007.  She was thought  to have a 40%-59% right ICA stenosis, no significant left ICA stenosis,  and carotid disease was felt to remain stable.  A thyroid ultrasound was  ordered by Dr. Debria Garret, and this was dated May 18, 2007.b The followup of  the thyroid scan was felt to lead to a recommendation of repeat thyroid  ultrasound in 6 months under the direction of Dr. Debria Garret.  Her overall  activity level has been relatively good.  She does not really have much  limitation, nor does she have much significant shortness of breath.  She  would like to go to [place].  A last stress Myoview study and in 2006  revealed a gated ejection fraction of 59%, with apical akinesis.  That  is better than the  echo study done.  In December 2008, she had an  abdominal study revealing mild 1%-59% bilateral renal stenoses, not  hemodynamically significant.  Overall, the patient remains stable at the  present time.   MEDICATIONS:  1. Hydrochlorothiazide 25 mg 1/2 tablet daily.  2. Aspirin 325 mg daily.  3. Zetia 10 mg daily.  4. Plavix 75 mg daily.  5. Toprol 25 mg daily.  6. Altace 2.5 mg b.i.d.  7. Actonel 35 mg weekly.  8. Nexium 40 mg daily.  9. CoQ 10, 100 mg daily.  10.Crestor 10 mg daily.  11.Metformin 500 mg 2 q.p.m.  12.Zoloft 25 mg daily.  13.Multivitamin.  14.Citrucel.  15.B12.  16.Premarin 0.625 mg vaginal cream weekly.  17.Ciclopirox topical.  18.Cyclobenzaprine hydrochloride 5 mg every other day.   PHYSICAL EXAMINATION:  GENERAL:  She really is alert and oriented, in no  acute  distress.  VITAL SIGNS:  Blood pressure is 118/60, the pulse is 60 and regular.  LUNG FIELDS:  Clear to auscultation and percussion.  CARDIAC:  Rhythm is regular, with an S4 gallop.  EXTREMITIES:  There is no significant extremity edema.     EKG reveals ventricular rate of 69, sinus rhythm, with premature atrial  contractions, septal inferior infarct of indeterminate age, likely  representing her apical findings in the past.   IMPRESSION:  1. Coronary artery disease, status post two-vessel coronary artery      intervention, with prior angioplasty of the left anterior      descending, and stenting of the right coronary artery, with      continued patency demonstrated April 17, 2006.  2. Mild left ventricular dysfunction, with wall motion abnormality, as      previously noted.  3. History of peripheral petechiae or blanching of uncertain etiology.  4. History of mild bilateral renal artery stenosis, 0%-59%.  5. History of diabetes.   PLAN:  1. Return to clinic in 6 months.  2. Continue current medical regimen.  3. Thorough discussion of all of the findings above.     Stacy [name]. Lia Foyer,  MD, [facility]  Electronically Signed    TDS/MedQ  DD: 06/05/2007  DT: 06/05/2007  Job #: [id number]

## 2010-06-26 NOTE — Assessment & Plan Note (Signed)
OFFICE VISIT   CANDELL, Stacy [name]  DOB:  05/27/1926                                       04/30/2007  [id number]   The patient returns today for regular followup of her carotid disease.  She has been doing generally well all without major complaints.  Remains  on aspirin and Plavix.  No recent hospitalizations.  Some complaints of  loss of memory.  No visual disturbance.  No sensory or motor deficit.  No speech problems.   Medications were reviewed today per the chart.   Carotid Doppler evaluation continues to reveal a 40-59% right ICA  stenosis, no significant left ICA stenosis.  This is an unchanged study.  Antegrade vertebral flow bilaterally.   Abdominal ultrasound was also carried out at this time due to a  fullness, an abdominal pulsatile central fullness.  This reveals no  evidence of aneurysm with a maximal aortic diameter measuring 1.38 cm.   The patient appears generally well, alert and oriented.  BP is 110/69,  pulse 74 per minute and regular, respirations 16 per minute.  Carotids  reveal no bruits.  Cranial nerves intact.  Strength equal bilaterally.  Chest is clear without rales or rhonchi.  Normal heart sounds.  Abdomen  soft and nontender.  Central pulsatile fullness present.  Nontender.   Carotid disease remains quite stable and asymptomatic at this time.  We  will plan followup again in 6 months.  No evidence of abdominal aortic  aneurysm.   [name], M.D.  Electronically Signed   PGH/MEDQ  D:  04/30/2007  T:  05/01/2007  Job:  [id number]   cc:   [name], M.D.

## 2010-06-26 NOTE — Procedures (Signed)
DUPLEX ULTRASOUND OF ABDOMINAL AORTA   INDICATION:  Followup of abdomen/abdominal bruit.   HISTORY:  Diabetes:  Yes.  Cardiac:  MI in 1997.  Hypertension:  Yes.  Smoking:  No.  Connective Tissue Disorder:  Family History:  None.  Previous Surgery:   DUPLEX EXAM:         AP (cm)                   TRANSVERSE (cm)  Proximal             1.25 cm                   1.25 cm  Mid                  1.3 cm                    1.24 cm  Distal               1.25 cm                   1.22 cm  Right Iliac          0.94 cm                   1.07 cm  Left Iliac           1.04 cm                   1.02 cm   PREVIOUS:  Date:  AP:  TRANSVERSE:   IMPRESSION:  No evidence of abdominal aortic aneurysm noted.   ___________________________________________  P. Drucie Opitz, M.D.   MG/MEDQ  D:  04/30/2007  T:  05/01/2007  Job:  [id number]

## 2010-06-26 NOTE — Procedures (Signed)
CAROTID DUPLEX EXAM   INDICATION:  Followup of known carotid artery disease.   HISTORY:  Diabetes:  Yes.  Cardiac:  MI.  Hypertension:  No.  Smoking:  No.  Previous Surgery:  No.  CV History:  No.  Amaurosis Fugax No, Paresthesias No, Hemiparesis No                                       RIGHT             LEFT  Brachial systolic pressure:         118               118  Brachial Doppler waveforms:         Biphasic          Biphasic  Vertebral direction of flow:        Antegrade         Antegrade  DUPLEX VELOCITIES (cm/sec)  CCA peak systolic                   103               94  ECA peak systolic                   168               [id number]  ICA peak systolic                   230               [id number]  ICA end diastolic                   74                86  PLAQUE MORPHOLOGY:                  Calcified         None  PLAQUE AMOUNT:                      Moderate          None  PLAQUE LOCATION:                    Bif/ICA           None   IMPRESSION:  1. 60-79% right internal carotid artery stenosis.  2. Increased velocity of 235 cm/s noted at the right bulb.  3. Patent left internal carotid artery with no evidence of stenosis.   ___________________________________________  P. Drucie Opitz, M.D.   AC/MEDQ  D:  06/09/2008  T:  06/09/2008  Job:  [id number]

## 2010-06-26 NOTE — Assessment & Plan Note (Signed)
OFFICE VISIT   BOSER, Stacy [name]  DOB:  12-Jul-1926                                       10/22/2007  [id number]   The patient returns to the office today for continued routine  surveillance of her carotid disease.  She has no major complaints at  this time.  She remains on aspirin and Plavix.  No recent  hospitalizations.  Denies visual disturbance, sensory motor deficit.  No  problems with her memory.  No speech abnormalities.  No gait problems.   MEDICATIONS:  Reviewed and placed on the chart.   Carotid Doppler evaluation was carried out today and reveals mild  progression of velocities in right internal carotid artery measuring  223/61 cm/sec.  This is in the 60-79% stenosis range.  Mild disease  noted on the left side.   PHYSICAL EXAMINATION:  The patient appears generally well.  BP 104/68 in  the left arm, 123/78 in the right arm, pulse is 75 per minute.  Cranial  nerves intact.  Strength equal bilaterally.  Normal reflexes.  Heart  sounds normal without murmurs.  No carotid bruits.   The patient has stable carotid disease with mild progression noted in  the right side.  No further workup required at this time.  Continue  aspirin and Plavix.  Follow up in 6 months.   [name], M.D.  Electronically Signed   PGH/MEDQ  D:  10/22/2007  T:  10/23/2007  Job:  [id number]   cc:   [name], M.D.

## 2010-06-26 NOTE — Assessment & Plan Note (Signed)
Stacy [name] OFFICE NOTE   Stacy [name], Stacy [name]                         MRN:          [id number]  DATE:08/06/2006                            DOB:          December 24, 1926    Stacy [name] is really in for followup.  In general, she is doing well.  She denies any ongoing chest pain or significant shortness of breath.  She has not had any recurrent blue toe syndrome.  She has tolerated all  of this reasonably well.   Of note, she has mildly increased velocities in her renal arteries  bilaterally but less than 60% stenosis with normal renal sizes.  She is  on an ACE inhibitor but tolerates this quite well.  She has no current  symptomatology.  She did have one mildly elevated transaminase.   Her medications include:  1. Hydrochlorothiazide 25 mg daily.  2. Aspirin 325 mg daily.  3. Zetia 10 mg daily.  4. Plavix 75 mg daily.  5. Toprol-XL 25 mg daily.  6. Altace 2.5 mg p.o. b.i.d.  7. Calcium daily.  8. Actonel weekly.  9. Nexium 40 mg daily.  10.CoQ10 daily.  11.Crestor 10 mg daily.  12.Metformin 500 mg.  13.Zoloft 25 mg daily.  14.Multivitamin daily.  15.Citrucel 250 mg daily.  16.B12.   On physical examination, the blood pressure is 110/68, the pulse is 67,  weight is 141 pounds.  The lung fields are clear and the cardiac rhythm  is regular.   Recent laboratory studies included a potassium of 4.6, glucose of 111,  and SGPT of 44.  Recent 2-D echocardiogram was done at [facility] in March.  At that time, her ejection fraction was 55-60% with  hypokinesis of the distal septoapical wall.  There was grade 1 diastolic  dysfunction.  There was moderate tricuspid valve regurgitation and right  atrial dilatation.   Overall, the patient has done well and has not had any recurrent  dizziness.  She is currently doing well.  We will plan to see her back  in followup in approximately 3 months.  Should she  have any problems in  the interim, she is to call us.  Otherwise, she will stay on her current  medications.  We did discuss in some detail the Zetia today.  She has an  eye twitch which her daughter thinks may be related to this, but it is  very difficult to tell.   ADDENDUM:  EKG reveals normal sinus rhythm with anteroseptal and  inferior infarct of indeterminate age.  The Q's and the septal beats are  basically unchanged.     Loretha Brasil. Lia Foyer, MD, [facility]  Electronically Signed    TDS/MedQ  DD: 08/06/2006  DT: 08/07/2006  Job #: [id number]

## 2010-06-26 NOTE — Letter (Signed)
December 28, 2007    [name]  [address] [place] [address]  [place], [place]  [address]   RE:  Stacy [name], Stacy [name]  MRN:  [id number]  /  DOB:  09-24-1926   Dear Abbe Amsterdam:   I had the pleasure of seeing, [name], in the office today in  followup.  In general, she is doing well.  We discussed her  medications.  We reviewed her recent laboratory studies.  These were  compatible with a hemoglobin A1c is 6.9.  Her cholesterol, however, was  under good control with an LDL of 53.  Her vitamin D was at the lower  limits of normal.  She has had mild glucose elevation.  She has had no  chest pain.  She has had a minor degree of swelling in the left leg,  this has been noted in the past.   Today, on examination, she is a well-appearing female in no distress.  Blood pressure 110/64 and pulse 66.  Lung fields clear.  Cardiac rhythm is regular.   Her electrocardiogram demonstrates a normal sinus rhythm with anterior  infarct of indeterminate age and small inferior infarct as well.   Overall, this nice lady has continued to do extremely well.  I did not  change her to carvedilol as she is only on a very minor dose of  metoprolol, and she has a history of previous depression.  Moreover, we will get a renal ultrasound to reassess her renal arteries  and she is on an ACE inhibitor and does have some degree of bilateral  renal artery disease, although it is not appeared to be hemodynamically  significant.  I will see her back in followup in about 6 months.  Thanks  for allowing me to share in the care.    Sincerely,      Loretha Brasil. Lia Foyer, MD, [facility]  Electronically Signed    TDS/MedQ  DD: 12/28/2007  DT: 12/29/2007  Job #: [id number]

## 2010-06-26 NOTE — Procedures (Signed)
DUPLEX ULTRASOUND OF ABDOMINAL AORTA   INDICATION:  Follow up abdominal aortic bruit.   HISTORY:  Diabetes:  Yes.  Cardiac:  MI.  Hypertension:  Yes.  Smoking:  Previous.  Connective Tissue Disorder:  Family History:  No.  Previous Surgery:  No.   DUPLEX EXAM:         AP (cm)                   TRANSVERSE (cm)  Proximal             1.1 cm                    1.0 cm  Mid                  1.1 cm                    1.0 cm  Distal               1.2 cm                    0.98 cm  Right Iliac          0.6 cm                    0.9 cm  Left Iliac           0.9 cm                    0.98 cm   PREVIOUS:  Date:  AP:  TRANSVERSE:   IMPRESSION:  No evidence of abdominal aortic aneurysm noted.   ___________________________________________  V. Leia Alf, MD   CB/MEDQ  D:  05/01/2009  T:  05/02/2009  Job:  [id number]

## 2010-06-26 NOTE — Procedures (Signed)
CAROTID DUPLEX EXAM   INDICATION:  Followup carotid artery disease.   HISTORY:  Diabetes:  Yes.  Cardiac:  MI in 1997.  Hypertension:  Yes.  Smoking:  No.  Previous Surgery:  No.  CV History:  No.  Amaurosis Fugax No, Paresthesias No, Hemiparesis No                                       RIGHT             LEFT  Brachial systolic pressure:         122               120  Brachial Doppler waveforms:         Biphasic          Biphasic  Vertebral direction of flow:        Antegrade         Antegrade  DUPLEX VELOCITIES (cm/sec)  CCA peak systolic                   78                82  ECA peak systolic                   168               [id number]  ICA peak systolic                   186               73  ICA end diastolic                   56                23  PLAQUE MORPHOLOGY:                  Calcified         None  PLAQUE AMOUNT:                      Moderate          None  PLAQUE LOCATION:                    ICA/CCA           None   IMPRESSION:  1. Right ICA stenosis of 40-59%.  2. Left ICA shows no evidence of stenosis.  3. No significant changes from previous study.   ___________________________________________  P. Drucie Opitz, M.D.   AS/MEDQ  D:  04/30/2007  T:  04/30/2007  Job:  [id number]

## 2010-06-26 NOTE — Procedures (Signed)
CAROTID DUPLEX EXAM   INDICATION:  Followup carotid artery disease.   HISTORY:  Diabetes:  Yes.  Cardiac:  MI.  Hypertension:  Yes.  Smoking:  Quit in 2000.  Previous Surgery:  CV History:                                       RIGHT             LEFT  Brachial systolic pressure:         116               118  Brachial Doppler waveforms:         WNL               WNL  Vertebral direction of flow:        Antegrade         Antegrade  DUPLEX VELOCITIES (cm/sec)  CCA peak systolic                   65                70  ECA peak systolic                   140               78  ICA peak systolic                   176               [id number]  ICA end diastolic                   61                41  PLAQUE MORPHOLOGY:                  Heterogeneous     Homogeneous  PLAQUE AMOUNT:                      Moderate          Mild  PLAQUE LOCATION:                    ICA and ECA       ICA   IMPRESSION:  Bilateral 40%-59% stenosis within internal carotid  arteries.  Right internal carotid artery is high end of range.  Antegrade flow within vertebrals bilaterally.   ___________________________________________  V. Leia Alf, MD   OD/MEDQ  D:  11/27/2009  T:  11/27/2009  Job:  [id number]

## 2010-06-26 NOTE — Assessment & Plan Note (Signed)
OFFICE VISIT   [name], Stacy P  DOB:  [birth year]                                       11/27/2009  [id number]   Patient is an 77 year old female who returns today for follow-up.  She  has been treated for a long time by Dr. Amedeo Plenty, who has been following  her abdominal aneurysm and carotid stenosis.  She comes back today  without symptoms, specifically, no numbness or weakness, slurring of  speech, amaurosis fugax.   The patient is medically managed for hypertension and  hypercholesterolemia.  She does have a history of a heart attack in  1997.  She is a diabetic, beginning at age 72.   SOCIAL HISTORY:  She is widowed, retired with 1 child.  Does not smoke,  quit in 2000.  Does not drink alcohol on a regular basis.  There have  been no changes to her family history.   REVIEW OF SYSTEMS:  GENERAL:  Negative for fever, chills, weight gain,  weight loss.  VASCULAR:  Positive for pain in the legs with walking.  CARDIAC:  Positive for a heart murmur.  GI:  Positive for reflux, hiatal hernia.  NEURO:  Negative.  PULMONARY:  Negative.  HEME:  Negative.  GU:  Negative.  ENT:  Negative.  MUSCULOSKELETAL:  Positive for __________ muscle pain.  PSYCH:  Negative.  SKIN:  Negative.   PHYSICAL EXAMINATION:  Heart rate 72, blood pressure 112/73, respiratory  rate 20.  General:  She is well-appearing in no distress.  HEENT:  Within normal limits.  Lungs are clear bilaterally.  No wheezes or  rhonchi.  Cardiovascular:  Regular rate and rhythm.  No carotid bruits.  Abdomen is soft, nontender.  No pulsatile mass.  Musculoskeletal:  Without major deformity.  Neuro:  Without focal deficits.  Skin without  rash.   DIAGNOSTIC STUDIES:  I have ordered and reviewed her ultrasound.  There  was no aneurysmal degeneration of her abdominal aorta.  Carotid  ultrasound reveals bilateral 40% to 59% stenosis.  The right is at the  high end of the scale.   ASSESSMENT/PLAN:  1. Abdominal aortic aneurysm:  I do not see that there is any need to      follow her aorta looking for aneurysmal changes.  This has not      changed significantly from the past several years, and it is not      aneurysmal at this time.  We will consider reimaging it in      approximately 5 years.  2. Carotid disease:  The patient's disease has been stable for several      years.  I think we can continue to follow this on a yearly basis as      opposed to on a biannual basis, as long as she remains      asymptomatic.     Eldridge Abrahams, MD  Electronically Signed   VWB/MEDQ  D:  11/27/2009  T:  11/28/2009  Job:  [id number]   cc:   Loretha Brasil. Lia Foyer, MD, [facility]  [name], M.D.

## 2010-06-26 NOTE — Assessment & Plan Note (Signed)
[place] OFFICE NOTE   NAME:[name], Stacy GUNNER                         MRN:          [id number]  DATE:09/30/2006                            DOB:          May 14, 1926    REASON FOR CONSULT:  Abnormal CT scan of the abdomen.   HISTORY OF PRESENT ILLNESS:  Stacy [name] is a very nice 77 year old white  female who I have evaluated in the past. She is here today with her  daughter. She underwent endoscopy in October of 2003 for GERD. She is  maintained on Nexium longterm for control of her reflux symptoms. She  previously underwent colonoscopy for small volume hematochezia in  January of 2001 which showed internal and external hemorrhoids. The  internal hemorrhoids were injected. The colon was noted to be tortuous.  She relates a history that her father was diagnosed with colon cancer in  his early 62s. No other family members with colon cancer, colon polyps  or inflammatory bowel disease. She was apparently having worsening  problems with chronic right back pain and occasional right lower  quadrant abdominal pain. She underwent a CT scan of the abdomen and  pelvis in October of 2007 which showed a small sliding hiatal hernia, a  left adrenal mass, as well as several benign appearing liver cysts.  There is a short segment of small bowel intussusception noted in the  left upper abdomen it persists on the delayed images. She has had  intermittent problems with mild constipation associated with small  volume hematochezia. She has no other gastrointestinal complaints at  this time.   PAST MEDICAL HISTORY:  Hypertension, coronary artery disease, status  post myocardial infarction, GERD, arthritis, history of skin cancer,  status post hernia repairs, status post hemorrhoidectomy, status post  angioplasty and stent placement, status post appendectomy, carotid  artery disease, diabetes mellitus, osteopenia, status post  bilateral  cataract surgery.   CURRENT MEDICATIONS:  Listed on the chart - updated and reviewed.   MEDICATION ALLERGIES:  SULFA, INTRAVENOUS CONTRAST DYE, AND TRANALAST -  A STUDY DRUG.   Social history and review of systems per the handwritten form.   PHYSICAL EXAMINATION:  Well-developed, well-nourished white female in no  acute distress. Height 5 feet 4 inches, weight 142 pounds, blood  pressure is 96/54, pulse 60 and regular.  HEENT EXAM: Anicteric sclera, oropharynx clear.  CHEST: Clear to auscultation bilaterally.  CARDIAC: Regular rate and rhythm without murmurs appreciated.  ABDOMEN: Soft, nontender, nondistended. Normoactive bowel sounds. No  palpable organomegaly, masses, or hernias.  RECTAL EXAMINATION: Deferred to time of colonoscopy.  EXTREMITIES: Without cyanosis, clubbing, or edema.  NEUROLOGIC: Alert and oriented x3. Grossly nonfocal.   ASSESSMENT/[name]:  1. Abnormal abdominal CT scan showing small bowel intussusception in      the left upper quadrant. This may be induced by the oral contrast      bolus or may indicate an area of small bowel pathology. Proceed      with a small bowel follow through for further evaluation.  2. First degree relative with colon cancer. Rule out  colorectal      neoplasms. Risk, benefits, and alternatives to colonoscopy with      possible biopsy and possible polypectomy discussed with the      patient, and she consents to proceed. This will be scheduled      electively in October per her request and to allow follow up with      Dr. Drucie [name]. We will need to obtain clearance to hold Plavix for      5 to 7 days from Drs. Stacy [name] and Stacy [name]. She may remain on      an aspirin throughout the time of the colonoscopy.     Pricilla Riffle. Stacy Plan, MD, [facility]  Electronically Signed    MTS/MedQ  DD: 09/30/2006  DT: 10/01/2006  Job #: [id number]   cc:   Stacy [name], M.D.  Stacy [name]. Lia Foyer, MD, [facility]  Stacy [name], M.D.

## 2010-06-26 NOTE — Procedures (Signed)
DUPLEX ULTRASOUND OF ABDOMINAL AORTA   INDICATION:  Followup abdominal aorta bruit.   HISTORY:  Diabetes:  Yes.  Cardiac:  MI.  Hypertension:  Yes.  Smoking:  Previous.  Connective Tissue Disorder:  Family History:  No.  Previous Surgery:  No.   DUPLEX EXAM:         AP (cm)                   TRANSVERSE (cm)  Proximal             1.9 cm                    2.2 x 1.8 cm  Mid                  1.0 cm                    1.2 x 1.0 cm  Distal               1.1 cm                    1.5 x 1.2 cm  Right Iliac          0.70 cm                   0.87 x 0.86 cm  Left Iliac           0.70 cm                   1.0 x 0.80 cm   PREVIOUS:  Date:  AP:  TRANSVERSE:   IMPRESSION:  No evidence of abdominal aortic aneurysm although proximal  internal carotid artery is ectatic.  There is mild to moderate  atherosclerosis noted within the aorta.  Note the proximal measurements  are 2.2 x 1.9 x 1.8 cm which is ectatic.   ___________________________________________  V. Leia Alf, MD   OD/MEDQ  D:  11/27/2009  T:  11/27/2009  Job:  [id number]

## 2010-06-26 NOTE — Assessment & Plan Note (Signed)
[place] OFFICE NOTE   Stacy [name], [place]                         MRN:          [id number]  DATE:12/04/2006                            DOB:          1926/05/24    Stacy [name] is in for a followup visit.  She really is doing pretty well.  She had some back discomfort, as well as discomfort bilaterally in her  legs.  She stopped her Zetia per the recommendation of her daughter, and  with this, ended up having marked improvement in her symptoms.  Whether  this was from the [place] is unclear.  She denies any current chest pain.  She actually says she feels quite well.  She also had some laboratory  studies done in Dr. Vivia Budge office.  Hematocrit was 41.7, BUN and  creatinine were normal, the potassium was normal, as well.  LDL  cholesterol was 36 and the triglycerides were 166.   CURRENT MEDICATIONS INCLUDE:  1. Hydrochlorothiazide 25 mg one half tablet daily.  2. Aspirin 325 mg daily.  3. Plavix 75 mg daily.  4. Toprol 25 mg daily.  5. Altace 2.5 b.i.d.  6. Actonel 35 mg weekly.  7. Nexium 40 mg daily.  8. Co-Q10 100 mg daily.  9. Crestor 10 mg daily.  10.Metformin 500 mg two tablets q.p.m.  11.Zoloft 25 mg daily.  12.A multivitamin.  13.Citrucel.  14.Premarin vaginal cream.   PHYSICAL EXAMINATION:  She is alert and oriented, in no acute distress.  She looks quite well.  The blood pressure today is 104/60, the pulse is 56.  The lung fields are clear.  The cardiac rhythm is regular.  There is not a significant murmur noted.   IMPRESSION:  1. Coronary artery disease, status post percutaneous coronary      intervention with prior anterior infarction.  2. Hypercholesterolemia, on lipid-lowering therapy.  3. Advanced age.  4. History of intermittent ischemia in the digits of uncertain      etiology with extensive workup.  5. Mild bilateral renal artery stenosis.   RECOMMENDATIONS:  1. A repeat renal  artery ultrasound will be obtained in December of      this year.  2. She will return to clinic and see me in six weeks.   ADDENDUM:  EKG reveals inferior infarct of indeterminate age and delayed  R-wave progression, compatible with an anterior wall MI.  There is  really not an interval change from previous tracings.     Loretha Brasil. Stacy Foyer, MD, [facility]  Electronically Signed    TDS/MedQ  DD: 12/04/2006  DT: 12/05/2006  Job #: [id number]

## 2010-06-26 NOTE — Assessment & Plan Note (Signed)
OFFICE VISIT   Stacy [name], Stacy [name]  DOB:  February 10, 1927                                       11/07/2008  [id number]   REASON FOR VISIT:  Follow up carotid.   HISTORY:  This is an 77 year old female former patient of Dr. Amedeo Plenty who  he has been following for right carotid stenosis which has been stable  in the 60-79% range, she continues to be without symptoms.  Specifically  she denies any numbness or weakness in either extremity.  She denies  amaurosis fugax or slurring of speech.   The patient continues to be on aspirin and Plavix.  She has not had any  issues since her last visit.   PHYSICAL EXAMINATION:  Vital signs:  Blood pressure 104/62, pulse is 64.  She is well-appearing, in no distress.  Cardiovascular:  Regular rate  and rhythm.  No murmurs.  Neck is soft with no carotid bruits.   DIAGNOSTIC STUDIES:  The patient's right carotid artery shows evidence  of 60-79% stenosis and the left internal carotid artery is without  significant stenosis.  There are no significant changes from previous  study.   ASSESSMENT/PLAN:  1. Carotid disease:  The patient will continue with a 22-month      surveillance, we discussed the signs and symptoms of TIA/stroke and      she was told that if she experienced any of these symptoms to go      straight to emergency department.  2. Abdominal aorta:  The patient had an ultrasound in March 2009 that      did not show significant dilatation of her abdominal aorta. This      was done secondary to a bruit. Dr. Amedeo Plenty recommended evaluating      this every other year.  I will plan on having this done at her next      visit.  If it is normal, we will discontinue abdominal ultrasounds.   Eldridge Abrahams, MD  Electronically Signed   VWB/MEDQ  D:  11/07/2008  T:  11/08/2008  Job:  [id number]   cc:   [name], M.D.  Dr. Lia Foyer

## 2010-06-26 NOTE — Procedures (Signed)
CAROTID DUPLEX EXAM   INDICATION:  Followup carotid artery disease.   HISTORY:  Diabetes:  Yes.  Cardiac:  MI.  Hypertension:  Yes.  Smoking:  No.  Previous Surgery:  No.  CV History:  No.  Amaurosis Fugax No, Paresthesias No, Hemiparesis No                                       RIGHT             LEFT  Brachial systolic pressure:         116               115  Brachial Doppler waveforms:         Biphasic          Biphasic  Vertebral direction of flow:        Antegrade         Antegrade  DUPLEX VELOCITIES (cm/sec)  CCA peak systolic                   M=101, D=157      94  ECA peak systolic                   155               99  ICA peak systolic                   223               93  ICA end diastolic                   61                33  PLAQUE MORPHOLOGY:                  Calcified         None  PLAQUE AMOUNT:                      Moderate          None  PLAQUE LOCATION:                    ICA/CCA           None   IMPRESSION:  1. Right ICA shows evidence of 60-79% stenosis (low end of range), a      slight increase from previous study.  2. Left ICA shows no evidence of stenosis.   ___________________________________________  P. Drucie Opitz, M.D.   AS/MEDQ  D:  10/22/2007  T:  10/22/2007  Job:  [id number]

## 2010-06-26 NOTE — Procedures (Signed)
CAROTID DUPLEX EXAM   INDICATION:  Follow up carotid artery disease.   HISTORY:  Diabetes:  Yes, on oral medication.  Cardiac:  MI in 1997  Hypertension:  Yes  Smoking:  No  Previous Surgery:  No  CV History:  No  Amaurosis Fugax No, Paresthesias No, Hemiparesis No                                       RIGHT             LEFT  Brachial systolic pressure:         98                94  Brachial Doppler waveforms:         Biphasic          Biphasic  Vertebral direction of flow:        Antegrade         Antegrade  DUPLEX VELOCITIES (cm/sec)  CCA peak systolic                   75                66  ECA peak systolic                   165               89  ICA peak systolic                   181               58  ICA end diastolic                   46                24  PLAQUE MORPHOLOGY:                  Calcified         None  PLAQUE AMOUNT:                      Moderate to severe                  None  PLAQUE LOCATION:                    ICA/ECA           None   IMPRESSION:  Right ECA stenosis.  A 40-59% right ICA stenosis.  Study essentially unchanged from previous exam of May 08, 2006.   ___________________________________________  P. Drucie Opitz, M.D.   DP/MEDQ  D:  10/14/2006  T:  10/15/2006  Job:  [id number]

## 2010-06-26 NOTE — Procedures (Signed)
CAROTID DUPLEX EXAM   INDICATION:  Follow up carotid artery disease.   HISTORY:  Diabetes:  Yes.  Cardiac:  MI.  Hypertension:  Yes.  Smoking:  Previous.  Previous Surgery:  No.  CV History:  Asymptomatic.  Amaurosis Fugax No, Paresthesias No, Hemiparesis No.                                       RIGHT               LEFT  Brachial systolic pressure:         125                 124  Brachial Doppler waveforms:         WNL                 WNL  Vertebral direction of flow:        Antegrade           Antegrade  DUPLEX VELOCITIES (cm/sec)  CCA peak systolic                   M = 87, D = 209     86  ECA peak systolic                   165                 [id number]  ICA peak systolic                   267                 [id number]  ICA end diastolic                   70                  33  PLAQUE MORPHOLOGY:                  Calcified  PLAQUE AMOUNT:                      Moderate            None  PLAQUE LOCATION:                    Bifurcation/ICA/CCA   IMPRESSION:  1. Right internal carotid artery shows evidence of 60-79% stenosis.  2. Right distal common carotid artery/bifurcation stenosis with      velocities of 209 cm/s.  3. Left internal carotid artery shows no evidence of stenosis.  4. No significant changes from previous study.   ___________________________________________  V. Leia Alf, MD   AS/MEDQ  D:  11/07/2008  T:  11/07/2008  Job:  [id number]

## 2010-06-29 NOTE — Cardiovascular Report (Signed)
NAMESAIRY, LEWI                  ACCOUNT NO.:  [id number]   MEDICAL RECORD NO.:  [id number]          PATIENT TYPE:  INP   LOCATION:  83                         FACILITY:  [facility]   PHYSICIAN:  Loretha Brasil. Lia Foyer, MD, FACCDATE OF BIRTH:  Apr 09, 1926   DATE OF PROCEDURE:  DATE OF DISCHARGE:                            CARDIAC CATHETERIZATION   The patient has a history of contrast allergy.  She was pretreated with  steroids, dipheniramine, and famotidine.  There were no periprocedural  contrast dye reactions.      Loretha Brasil. Lia Foyer, MD, [facility]  Electronically Signed     TDS/MEDQ  D:  04/17/2006  T:  04/17/2006  Job:  [id number]

## 2010-06-29 NOTE — H&P (Signed)
[facility]  Patient:    Stacy [name], Stacy [name]                           MRN: [id number] Adm. Date:  08/28/99 Attending:  Olivia Canter. Theda Sers, M.D. CC:         Day Surgery @ [facility]             [facility]                         History and Physical  DATE OF BIRTH:                [birth year] ______  HISTORY OF PRESENT ILLNESS:   The patient is a 77 year old, gravida 1, para 1, white female who presented recently with postmenopausal bleeding.  Pelvic ultrasound was performed in May which showed some fundal thickening of the endometrium.  The patient was urged to undergo a D&C hysteroscopy to rule out endometrial cancer.  Risks of surgery including anesthetic complications, hemorrhage, infection, damage to adjacent structures including bladder, bowel, blood vessels or ureter were discussed with the patient.  She was made aware of the risks of uterine perforation which could result in an overwhelming life threatening hemorrhage requiring emergent hysterectomy or uterine perforation which could result in bowel injury requiring colostomy or resulting in overwhelming life threatening peritonitis.  The patient and her daughter expressed understanding of and acceptance of these risks.  The patient did undergo an endometrial biopsy December 15, 1998, for postmenopausal bleeding which showed benign proliferative endometrium.  She is thus admitted for a D&C hysteroscopy to rule out endometrial cancer.  PAST MEDICAL HISTORY:         Significant for elevated cholesterol, coronary artery stents, reflux, hypertension, and osteoporosis.  Peptic ulcer disease diagnosed in 1980 and MI diagnosed in 1997.  MEDICATIONS:                  Actonel, Zocor, Ecotrin, Prempro 0.625/5 mg, Lotensin, Paxil, Prevacid, Lopressor, HCTZ, vitamin E, calcium and multivitamins.  ALLERGIES:                    SULFA causes hives.  X-RAY DYE causes hives.  SOCIAL HISTORY:                The patient is retired.  PAST SURGICAL HISTORY:        Significant for a D&C in 1964 and hemorrhoid surgery in 1961 and 1980, appendectomy in 1980.  Peptic ulcer disease in 1980.  FAMILY HISTORY:               Significant for hypertension in the paternal side of the family.  History of diabetes mellitus in the patients father.  PHYSICAL EXAMINATION:  HEENT:                        Normal.  NECK:                         Supple without thyromegaly.  LUNGS:                        Clear to auscultation.  CARDIAC:                      Regular rate and rhythm.  ABDOMEN:  Soft, nontender.  No hepatosplenomegaly.  BREASTS:                      Without masses, nodes, dimpling, or retraction.  PELVIC:                       BUS normal. Cervix without lesion.  Pap smear performed Jun 22, 1999, was within normal limits.  Uterus is anteverted, normal, mobile.  No adnexal mass palpated.  RECTAL:                       Confirmatory, no mass.  ASSESSMENT AND PLAN:          The patient is a 77 year old, gravida 1, para 1, with continued postmenopausal bleeding despite endometrial biopsy which showed proliferative endometrium with thickening of the endometrial tissue at the fundus admitted for [facility] hysteroscopy to rule out endometrial hyperplasia or carcinoma.  The risks have been explained to the patient and she expresses understanding of and acceptance of these risks.  We did contact Dr. Lucia Gaskins office and per Margarita Sermons, who is Dr. Kristine Royal.  The patient is okay for surgery.  She has undergone a recent colonoscopy without incident for which similar drugs were used. DD:  08/28/99 TD:  08/28/99 Job: [id number]

## 2010-06-29 NOTE — Op Note (Signed)
[name], [name]                            ACCOUNT NO.:  [id number]   MEDICAL RECORD NO.:  [id number]                   PATIENT TYPE:  OIB   LOCATION:  2899                                 FACILITY:  [facility]   PHYSICIAN:  [name], M.D.                 DATE OF BIRTH:  06-Jan-1927   DATE OF PROCEDURE:  05/27/2003  DATE OF DISCHARGE:  05/27/2003                                 OPERATIVE REPORT   SURGEON:  [name], M.D.   PREOPERATIVE DIAGNOSIS:  Blue toe syndrome, right foot.   POSTOPERATIVE DIAGNOSIS:  Blue toe syndrome, right foot.   PROCEDURE:  Abdominal aortogram with bilateral lower extremity run off  arteriography.   ACCESS:  Right common femoral artery 5 French sheath.   CONTRAST:  135 mL Visipaque.   COMPLICATIONS:  None apparent.   CLINICAL NOTE:  Stacy [name] is a 77 year old female who has suffered  recurrent episodes of apparent atheroemboli to the right foot.  She develops  blue discoloration of her toes.  This has most recently involved the right  first and third toes.  She has previously undergone MR angiography of the  thoracic and abdominal aorta which is unremarkable.  TEE was unremarkable.  She was brought to the cath lab at this time for diagnostic arteriographic  workup.   OPERATIVE PROCEDURE:  The patient was brought to the cath lab in stable  condition.  Placed in a supine position.  Both groins were prepped and  draped in a sterile fashion.  1 mg Versed and 25 mcg of Fentanyl were  administered intravenously.  The skin and subcutaneous tissue in the right  groin was instilled with 1% Xylocaine.  A needle was easily introduced into  the right common femoral artery.  A 0.035 Rosen guide-wire was advanced  through the needle into the abdominal aorta.  The needle was removed and a 5  French sheath was advanced over the guide-wire and the sheath flushed with  saline solution.  A standard pigtail catheter was advanced over the guide-  wire to the  mid abdominal aorta.  AP abdominal aortogram was obtained.  This  revealed single bilateral renal arteries.  Mild stenosis on the right main  renal artery noted at approximately 30-40%.  Left main renal artery widely  patent.  The infrarenal aorta was normal in caliber without significant  plaque disease.  The common iliac, internal and external iliac arteries  bilaterally appeared normal without significant stenosis or plaque disease.  Bilateral oblique pelvic arteriograms were obtained revealing no evidence of  significant iliac occlusive disease.  Lower extremity run off arteriography  was obtained.  This revealed normal flow in the external iliac and common  femoral arteries bilaterally.  The superficial femoral arteries were patent  bilaterally.  Normal popliteal arteries bilaterally.  The tibial peroneal  trunk was normal.  Run off to the foot  was normal bilaterally with intact  three vessel run off.  This completed the arteriogram procedure.  The guide-  wire was reinserted and the pigtail catheter was removed.  The femoral  sheath was removed.  No apparent complications.   FINAL IMPRESSION:  1. Normal aortoiliac segment.  2. Mild right renal artery stenosis.  3. Intact infrainguinal run off bilaterally without significant     atherosclerotic peripheral vascular disease.   DISPOSITION:  The results have been reviewed with the patient and family.  The patient will be referred for a rheumatology evaluation to rule out  vasculitis.                                               [name], M.D.    PGH/MEDQ  D:  05/27/2003  T:  05/27/2003  Job:  [id number]   cc:   [name], M.D.  [address]  [place]  [place] [address]  Fax: [place]. Lia Foyer, M.D. [facility]   Lindaann Slough, M.D.

## 2010-06-29 NOTE — Op Note (Signed)
Stacy [name], Stacy [name]                            ACCOUNT NO.:  [id number]   MEDICAL RECORD NO.:  [id number]                   PATIENT TYPE:  OIB   LOCATION:  NA                                   FACILITY:  [facility]   PHYSICIAN:  Earnstine Regal, M.D.                DATE OF BIRTH:  Jun 08, 1926   DATE OF PROCEDURE:  09/28/2001  DATE OF DISCHARGE:                                 OPERATIVE REPORT   PREOPERATIVE DIAGNOSIS:  Incisional hernia.   POSTOPERATIVE DIAGNOSIS:  Incisional hernia.   PROCEDURE:  Repair, incisional hernia, with Prolene mesh.   SURGEON:  Earnstine Regal, M.D.   ANESTHESIA:  General.   ESTIMATED BLOOD LOSS:  Minimal.   PREPARATION:  Betadine.   COMPLICATIONS:  None.   INDICATIONS:  The patient is a 77 year old white female well-known to my  surgical practice.  The patient sustained a fall in 4/03.  She developed  right-sided abdominal pain and later a bulge in the right abdominal wall.  She was seen by her primary care physician and referred to our practice for  evaluation.  She was found on physical examination to have an incisional  hernia at the site of previous appendectomy.  The patient now comes to  surgery for repair.   DESCRIPTION OF PROCEDURE:  The procedure was done in OR #17 at the [place]  [facility].  The patient was brought to the operating room and  placed in the supine position on the operating room table.  Following  administration of general anesthesia, the patient was prepped and draped in  the usual strict aseptic fashion.  After ascertaining that an adequate level  of anesthesia had been obtained, the patient's previous appendectomy scar  was reopened with a #15 blade.  Dissection was carried down through the  subcutaneous tissues.  The hernia sac is identified.  Hernia sac is  dissected out circumferentially down to the fascial defect.  The fascial  defect measures about 3 cm in length.  The hernia sac contains omentum,  which  is reduced back within the peritoneal cavity.  The hernia sac is  completely excised.  Fascial planes are developed around the defect.  The  defect is closed with interrupted 0 Prolene sutures  Next a sheet of Prolene  mesh is cut to the appropriate dimensions.  It is placed over the closure.  It is secured circumferentially to the external oblique fascia with  interrupted 0 Prolene sutures.  Good hemostasis is noted.  The wound is  irrigated copiously with warm saline, which is evacuated.  Subcutaneous  tissues are closed with interrupted 3-0 Vicryl s sutures.  The skin edges  are anesthetized with local anesthetic and the skin edges are reapproximated  with interrupted 4-0 Vicryl subcuticular sutures.  The wound is  washed and dried and Benzoin and steri-strips are applied.  Sterile gauze  dressings are applied.  The patient is awakened from the anesthesia and  brought to the recovery room in stable condition.  The patient tolerated the  procedure well.                                               Earnstine Regal, M.D.    TMG/MEDQ  D:  09/28/2001  T:  09/29/2001  Job:  [id number]   cc:   Cordie Grice, M.D.   Loretha Brasil. Lia Foyer, M.D. [facility]   P. Drucie Opitz, M.D.

## 2010-06-29 NOTE — Assessment & Plan Note (Signed)
[place]                            CARDIOLOGY OFFICE NOTE   DEVINA, KECKLER                         MRN:          [id number]  DATE:01/31/2006                            DOB:          07-23-1926    Ms. Kassa presents to the [facility] office today for an acute  illness visit, as she has developed a recurrence of a blue toe.  Yesterday she noticed discoloration of her right fourth toe.  She and  her daughter report that it has a similar appearance to other discolored  toes that she has had in the past.  She denies any pain.  She has not  had any recent invasive procedures or catheter manipulation.  She is not  anticoagulated with warfarin.  She otherwise feels well with the  exception of a recent upper restriction tract infection.  She has had  some sinus congestion but no other symptoms.  She specifically denies  fevers, chills, or any constitutional symptoms.  She has had no chest  pain, dyspnea or other recent complaints.   Ms. Lallo first noted blue toes back in 1999.  This recurred again in  2004 and 2005, and initially affected the right great toe.  It has also  occurred on the right third toe and on the left foot as well.  Prior to  today, the last episode was in July of 2007.  These episodes have always  resolved spontaneously.  They have never been associated with  generalized symptoms.  She has had an extensive evaluation by Dr.  Lia Foyer and Dr. Drucie Opitz.  Dr. Amedeo Plenty performed an arteriogram back in  2005 that demonstrated mild renal artery stenosis but really no  significant atherosclerotic disease in the aorta or infrainguinal  arteries.  She has undergone an MRA as well as a transesophageal echo  that were also unremarkable for an etiology of atheroemboli.   MEDICATIONS:  1. Hydrochlorothiazide 25 mg daily.  2. Aspirin 325 mg    daily.  3. Zetia 10 mg daily.  4. Plavix 75 mg daily.  5. Toprol 25 mg daily.  6. Altace  2.5 mg twice daily.   PHYSICAL EXAMINATION:  The patient is alert and oriented.  She is in no  acute distress.  She is a well-appearing female.  Her weight is 142 pounds.  Blood pressure is 118/68.  Heart rate is 80.  Respiratory rate is 12.  Exam of the lower extremities demonstrates intact pedal pulses with 2+  dorsalis pedis and posterior tibial pulses bilaterally.  The feet are  both warm and well-perfused.  There is an area of bluish discoloration  on the dorsal aspect at the base of the right fourth toe.  The nail beds  are all normal.  Capillary refill is normal and there is no tenderness  to palpation over this area.  No other abnormalities are noted.   ASSESSMENT:  Ms. Clack presents with recurrence of a discoloration at  the basal aspect of the dorsum of the right fourth toe.  This is  unassociated with any clear risk  factors for atheroembolic disease such  as anticoagulation therapy or recent catheter manipulation.  The  appearance is not typical for cholesterol emboli syndrome.  However, I  will check a basic metabolic panel just to rule out any renal problems,  which can be a manifestation of this.  I  suspect this will be normal.  I otherwise recommended a period of  observation, and if symptoms progress we will consider further  evaluation.     Juanda Bond. Burt Knack, MD  Electronically Signed    MDC/MedQ  DD: 01/31/2006  DT: 02/01/2006  Job #: [id number]   cc:   Loretha Brasil. Lia Foyer, MD, FACC  P. Drucie Opitz, M.D.

## 2010-06-29 NOTE — Discharge Summary (Signed)
Stacy [name], Stacy [name]                  ACCOUNT NO.:  [id number]   MEDICAL RECORD NO.:  [id number]          PATIENT TYPE:  INP   LOCATION:  46                         FACILITY:  [facility]   PHYSICIAN:  Loretha Brasil. Lia Foyer, MD, FACCDATE OF BIRTH:  1926-04-12   DATE OF ADMISSION:  04/15/2006  DATE OF DISCHARGE:  04/17/2006                               DISCHARGE SUMMARY   PRIMARY CARDIOLOGIST:  Marcello Moores D. Lia Foyer, MD, [facility]   PRINCIPAL DIAGNOSIS:  Chest pain/coronary artery disease.   SECONDARY DIAGNOSES:  1. Dizziness.  2. Bilateral shoulder pain.  3. Hypertension.  4. Hyperlipidemia.  5. Gastroesophageal reflux disease.   ALLERGIES:  IV DYE and SULFA.   PROCEDURE:  Left heart cardiac catheterization.   HISTORY OF PRESENT ILLNESS:  A 77 year old Caucasian female with prior  history of coronary artery disease, status post anterior MI in 1997  treated with PTCA of the LAD and subsequent stenting of the RCA in April  of 2000.  Over the past month she has been experiencing intermittent  bilateral shoulder discomfort now occurring with exertion and  nocturnally.  She was seen in the office on April 15, 2006; and decision  was made to admit her for catheterization and further evaluation as well  as neuro evaluation secondary to complaints of somewhat chronic  dizziness.   HOSPITAL COURSE:  The patient ruled out for MI and underwent 2-D  echocardiogram on March 5 revealing an EF of 55-60% with mild mitral  regurgitation and moderate tricuspid regurgitation.  Catheterization was  performed on March 6 revealing nonobstructive coronary artery disease.  Secondary to a previously documented DYE allergy, the patient was a  prepped with IV Solu-Medrol, Benadryl, and Pepcid prior to  catheterization.  As the catheterization revealed no culprits to explain  her chest pain, a D-dimer was checked; and was normal at 0.27.   Secondary to the patient's complaints of dizziness, an MRI of the brain  was  performed revealing no acute intracranial abnormality with chronic  microvascular disease.  MRA was suggestive of mild intracranial  atherosclerosis.  Neurology was consulted; and felt that the patient was  experiencing mild chronic episodes of positional/orthostatic vertigo and  disequilibrium.  They did not feel that the patient required any medical  or physical therapy at this point.  Stacy [name] has had no recurrent chest  or shoulder discomfort or dizziness.  She is being discharged home today  in satisfactory condition.   DISCHARGE LABS:  Hemoglobin 12.1, hematocrit 35.7, WBC 12.3, platelets  205.  Sodium 142, potassium 2.4, chloride 107, CO2 30, BUN 15,  creatinine 0.77, glucose 117, calcium 8.1.  Cardiac enzymes negative x2.  Total cholesterol 130, triglycerides 41, HDL 58, LDL 64.   DISPOSITION:  The patient is being discharged home today in good  condition.   FOLLOWUP PLANS AND APPOINTMENTS:  She is follow up with Dr. Maren Beach PA  or nurse practitioner on March 24 at 9:15 a.m..   DISCHARGE MEDICATIONS:  1. Aspirin 325 mg daily.  2. Plavix 75 mg daily.  3. Nexium 40 mg daily.  4. Altace  2.5 mg daily.  5. Toprol XL 25 mg daily.  6. HCTZ 12.5 mg daily.  7. Zoloft 25 mg daily.  8. Zetia 10 mg daily.  9. Crestor 10 mg daily.  10.Flexeril 5 mg as previously prescribed.  11.Metformin 500 mg b.i.d. to be resumed April 19, 2006.  12.Premarin cream 0.625 mg weekly.  13.Centrum Silver 1 daily.  14.Coenzyme Q-10 taking 150 mg daily.  15.Citracal 500 mg t.i.d.  16.Nitroglycerin 0.4 mg sublingual p.r.n. chest pain.   Outstanding lab studies none.  Duration discharge encounter 45 minutes  including physician time.      Stacy [name], ANP      Loretha Brasil. Lia Foyer, MD, [facility]  Electronically Signed    CB/MEDQ  D:  04/18/2006  T:  04/18/2006  Job:  [id number]

## 2010-06-29 NOTE — Op Note (Signed)
[facility]  Patient:    Stacy [name], Stacy [name]                         MRN: [id number] Proc. Date: 08/28/99 Adm. Date:  [id number] Attending:  Lovey Newcomer CC:         [facility]             Micheline Chapman, M.D., [facility], [place], [place]                           Operative Report  PREOPERATIVE DIAGNOSIS:       Postmenopausal bleeding.  Normal endometrial biopsy.  POSTOPERATIVE DIAGNOSES:      Postmenopausal bleeding.  Normal endometrial biopsy.  Endometrial polyp.  PROCEDURE:                    Dilatation, curettage and hysteroscopy.  SURGEON:                      Olivia Canter. Theda Sers, M.D.  ANESTHESIA:                   MAC plus 9 cc of 1% lidocaine paracervical block.  ESTIMATED BLOOD LOSS:         Minimal.  FLUIDS:                       800 cc of crystalloid and ampicillin and gentamicin for prophylaxis.  COMPLICATIONS:                None.  DRAINS:                       None.  FINDINGS:                     Endometrial polyp removed in its entirety.  DESCRIPTION OF OPERATION:     Patient was brought to the operating room and identified on the operating room table.  After induction of adequate MAC analgesia, the patient was placed in the dorsal lithotomy position and prepped and draped in the usual sterile fashion.  The bladder was straight-catheterized for approximately 300 cc of clear-yellow urine. Examination under anesthesia revealed the uterus to be slightly retroverted, normal, approximately 6-weeks-size, without any adnexal mass palpated.  The speculum was placed and the posterior lip of the cervix was infiltrated with 1 cc of 1% lidocaine and grasped with a single-tooth tenaculum.  The remaining 8 cc of 1% lidocaine were placed for a paracervical block.  The cervix was very gently dilated up to a #23 Pratt dilator.  The uterus sounded to approximately 7 cm.  The [facility] hysteroscope was placed and a very careful  and thorough hysteroscopic examination was performed.  An endometrial polyp at the fundus was identified.  Both tubal ostia were identified as well.  The hysteroscope was removed and the serrated curette was then placed.  Curettage was performed in a systematic clockwise fashion.  The polyp was removed in its entirety.  The Randall stone forceps were placed and additional portions of polyp were obtained.  The hysteroscope was again placed and the polyp was noted to have been removed in its entirety and the entire endometrium was noted to be completely sampled.  At that point, the procedure was then terminated.  The tenaculum was removed and  there was noted to be no bleeding from the tenaculum site.  There was noted to be minimal bleeding from the uterus.  The patient tolerated the procedure well without apparent complications and was transferred to recovery room in stable condition after all instrument, sponge and needle counts were correct.  The patient was given the postop instruction sheet and urged to return in two to three weeks for a postoperative evaluation.  She also was given a prescription in the office for ampicillin 500 mg p.o. q.6h. to begin six hours after her IV antibiotic dose and to be taken over a 24-hour period.  She is to call for heavy bleeding and temperature greater than 100 or any problem.  She is also to take Tylenol 650 mg p.o. q.6h. p.r.n. pain. DD:  08/28/99 TD:  08/28/99 Job: [id number]

## 2010-06-29 NOTE — Cardiovascular Report (Signed)
NAMECILLA, BOBBITT                  ACCOUNT NO.:  [id number]   MEDICAL RECORD NO.:  [id number]          PATIENT TYPE:  INP   LOCATION:  54                         FACILITY:  [place]   PHYSICIAN:  Loretha Brasil. Lia Foyer, MD, FACCDATE OF BIRTH:  1926/08/17   DATE OF PROCEDURE:  04/17/2006  DATE OF DISCHARGE:                            CARDIAC CATHETERIZATION   INDICATIONS:  Ms. Costantino is a delightful 77 year old who has presented  with back discomfort and with radiation to the left arm.  Symptoms  somewhat similar to her prior infarct.  She had angioplasty of the left  anterior descending artery in 1997 and stenting of the right coronary  artery in 2000.  She is brought back to the laboratory for further  evaluation.   PROCEDURE:  Selective coronary arteriography.   DESCRIPTION OF PROCEDURE:  The procedure was performed from the right  femoral artery using 5-French catheters.  She tolerated the procedure  well.  Ventriculography was not performed.  There were no complications.   HEMODYNAMIC DATA.:  Central aortic pressure 134/64, mean 93.   ANGIOGRAPHIC DATA:  1. The left main coronary is large and free of critical disease.  2. The left anterior descending artery courses to the apex.  The LAD      demonstrates some mild luminal irregularities throughout, and the      vessel itself is calcified, but without obvious critical narrowing.      There is a large diagonal branch placed in the mid-vessel and this      also is without critical narrowing.  There is some narrowing in the      LAD of up to about 30% that represents diffuse luminal      irregularities.  3. The circumflex coronary artery is a moderate-sized vessel.  It      provides 2 marginal branches.  Other than mild luminal      irregularity, no critical stenoses is noted.  4. The right coronary artery has been previously stented.  The      proximal and mid vessel demonstrates a stent that is widely patent      with less than 10%  re-narrowing.  Distal to this, there is about      30% to 40% narrowing in the mid right coronary, but not a critical      stenosis.  The PDA is really quite tortuous, but other than mild      luminal irregularity, does not demonstrate high-grade disease.   CONCLUSIONS:  1. Continued patency of the right coronary artery stent with mild      luminal irregularities of the right coronary artery as noted.  2. Continued patency of the previous left anterior descending      angioplasty site with luminal irregularities as noted.   DISPOSITION:  The patient has multiple symptoms.  The exact etiology  remains yet unclear.  We will do some further testing, but at the  present time, no further invasive evaluation is required.      Loretha Brasil. Lia Foyer, MD, [facility]  Electronically Signed     TDS/MEDQ  D:  04/17/2006  T:  04/17/2006  Job:  [id number]   cc:   Loretha Brasil. Lia Foyer, MD, [facility]  [name], M.D.  [facility] CV Laboratory

## 2010-06-29 NOTE — H&P (Signed)
Stacy [name], Stacy [name]                         MRN:          [id number]  DATE:04/15/2006                            DOB:          01/26/1927    CHIEF COMPLAINT:  Bilateral shoulder pain, arm pain and dizziness.   HISTORY OF PRESENT ILLNESS:  This is a 77 year old white female patient  of Dr. Lia Foyer who has a history of coronary artery disease status post  anterior wall MI treated with PTCA of the LAD in 1997.  This was  followed by stenting of the RCA in April of 2000.  She has done fairly  well since then and her last stress Myoview was in 2006 that showed very  mild peri infarct ischemia at the mid ventricular level and base.  An EF  was 59% with apical hypokinesis, showed extensive prior distal and  anteroapical infarct.   The patient presents today with her daughter, complaining of bilateral  shoulder aching in the back of her shoulders, going down to her left  arm.  This started on February 14.  She noticed it when she went to bed.  She fell asleep without any problem and the aching went away.  Over the  past several weeks, this same pain returns every time she tries to do  her housework or vacuuming.  If she stops it goes away and when she  starts working again, the pain comes back.  She also has pain almost  nightly when she goes to bed.  She has not used any nitroglycerine as it  is short lived.  She denies any chest heaviness but this is similar to  her prior chest pain.  The last time she had the shoulder pain was last  evening before she went to bed.  When she awakened this morning, she was  quite dizzy when she first got up, but she has remained dizzy and still  does not feel right in her head.   ALLERGIES:  IV DYE, SULFA.   CURRENT MEDICATIONS:  1. Hydrochlorothiazide 25 mg a day.  2. Aspirin 325 mg daily.  3. Zetia 10 mg daily.  4. Plavix 75 mg daily.  5. Toprol XL 25 mg  daily.  6. Altace 2.5 mg b.i.d.  7. Citrucel 250 mg daily.  8. Cyclobenzaprine 5 mg p.r.n.  9. Xyzal 5 mg daily.   PAST MEDICAL HISTORY:  Significant for hyperlipidemia, hypertension,  appendectomy, history of right inguinal hernia repair in 2003,  gastroesophageal reflux disease, hemorrhoids.   SOCIAL HISTORY:  She lives alone.  Her daughter is close by and very  involved with her.  She actually volunteers 3 days a week at [facility] and was exercising 3 days a week up until this pain started.   FAMILY HISTORY:  Insignificant.   REVIEW OF SYSTEMS:  Significant for dizziness and presyncopal signs or  symptoms.  No dyspepsia, dysphagia, nausea or vomiting, change in bowels  or melena.  CARDIOPULMONARY:  Please see HPI.   PHYSICAL EXAMINATION:  This is a  pleasant 77 year old white female in no  acute distress.  Blood pressure 110/72.  Dr. Lia Foyer did do orthostatics  on her and she was not orthostatic in the office.  Pulse was 68, weight  140.12.  HEENT:  Normocephalic and atraumatic.  Extraocular movements intact.  Pupils equal and reactive to light and accommodation.  Oral mucosa is  moist without erythema or exudate.  NECK:  Without JVD, H&R, bruit or thyroid enlargement.  LUNGS:  Clear, anterior and posterolateral.  HEART:  Regular rate and rhythm at 70 BPM, normal S1 and S2, positive S4  with 1/6 systolic ejection murmur at the left sternal border.  ABDOMEN:  Soft, without organomegaly, mass, incisions, or abnormal  tenderness.  EXTREMITIES:  Without cyanosis, clubbing or edema.  She has good distal  pulses.   IMPRESSION:  1. Bilateral shoulder pain, rule out anginal equivalent.  2. Coronary artery disease status post anterior wall MI treated with      PTCA of the LAD in 1997.  3. Status post stenting to the RCA in April 2000.  4. Hypertension.  5. Hyperlipidemia.  6. History of blue toes dating back to 1999, unclear whether this is      atheroembolic disease or  not.  Always resolves on its own.  7. Gastroesophageal reflux disease.  8. Dizziness.   PLAN:  Dr. Lia Foyer wants to admit the patient to the hospital today, do  an MRI and MRA of the brain before undertaking cardiac catheterization  to rule out significant progression of her coronary artery disease.  She  does have an IV DYE ALLERGY and will need premedicated prior to cardiac  catheterization.  We tentatively have this scheduled for Thursday or  Friday of this week.      Ermalinda Barrios, PA-C  Electronically Signed      Loretha Brasil. Lia Foyer, MD, [facility]  Electronically Signed   ML/MedQ  DD: 04/15/2006  DT: 04/15/2006  Job #: [id number]

## 2010-06-29 NOTE — Assessment & Plan Note (Signed)
Stacy [name]                            CARDIOLOGY OFFICE NOTE   NAME:Stacy [name], Stacy [name]                         MRN:          [id number]  DATE:05/05/2006                            DOB:          1926-06-11    CARDIOLOGIST:  Dr. Bing Quarry   PRIMARY CARE PHYSICIAN:  Dr. [name]   HISTORY OF PRESENT ILLNESS:  Stacy [name] is a very pleasant 77 year old  female patient, followed by Dr. Lia Foyer with a history of coronary  disease, status post anterior wall myocardial infarction 1997, treated  with PTCA of the LAD and subsequent stenting of the RCA April 2000 who  presented to the office on April 15, 2006 with bilateral shoulder  discomfort with exertion as well as dizziness.  She ruled out for  myocardial infarction and underwent cardiac catheterization.  This  revealed non-obstructive disease and a patent stent in the RCA.  She had  a D-dimer checked which was negative at 0.27.  Due to her dizziness, she  had an MRI, MRA done which showed chronic microvascular disease and  suggestion of mild intracranial atherosclerosis.  She saw Dr. Jacolyn Reedy of  neurology who felt that her dizziness was probably mild chronic episodes  of positional vertigo and disequilibrium.  She did not feel that any  further treatment was warranted.  The patient also had an echocardiogram  performed which revealed normal LV function with an EF of 55% to 60%,  mild mitral regurgitation, mild to moderate tricuspid regurgitation.  There was a question of left pleural effusion.  Her admission chest x-  ray however showed COPD with no acute abnormality.  She returns to the  office today for followup.  She had been taken off of her metformin for  cardiac catheterization.  She was also pretreated for her IV DYE  allergy.  Recent labs by her primary care physician show a potassium of  4.6, BUN of 21, creatinine 0.86, glucose 111 and an ALT of 44.   As noted above, the patient returns to  the office today for post  hospitalization followup.  She is doing well.  She still complains of  interscapular back pain from time to time.  This seems to be more  musculoskeletal in nature.  She denies chest pain.  She denies any  shortness of breath, denies any syncope.  She does have a question about  whether or not she should continue on Altace as she has been told in the  past that she has bilateral renal artery stenosis.   CURRENT MEDICATIONS:  1. Hydrochlorothiazide 25 mg daily.  2. Aspirin 325 mg daily.  3. Zetia 10 mg daily.  4. Plavix 75 mg daily.  5. Toprol XL 25 mg daily.  6. Altace 2.5 mg b.i.d.  7. Nitroglycerin p.r.n.  8. Flexeril p.r.n.  9. Benadryl p.r.n.  10.Actonel weekly.  11.Nexium 40 mg daily.  12.Coenzyme Q10.  13.Crestor 10 mg daily.  14.Metformin 500 mg 3 times daily.  15.Zoloft 25 mg daily.  16.Multivitamin.  17.Citracal.   ALLERGIES:  IV DYE and SULFA.   PHYSICAL  EXAMINATION:  She is a well-nourished, well-developed female in  no acute distress.  Blood pressures 116/70, pulse 71, weight 139 pounds.  HEENT:  Is unremarkable.  NECK:  Without JVD.  CARDIAC:  S1, S2.  Regular rate and rhythm.  LUNGS:  Are clear to auscultation bilaterally without wheezing, rhonchi  or rales.  ABDOMEN:  Soft, nontender.  EXTREMITIES:  Without edema.  Right femoral arteriotomy site without  hematoma or bruit.   IMPRESSION:  1. Bilateral intrascapular and shoulder pain, non-cardiac.  2. Dizziness.      a.     Probable benign positional vertigo and disequilibrium - no       further therapy or workup warranted.  3. Coronary artery disease.      a.     Status post anterior wall myocardial infarction with PTCA of       left anterior descending in 1997.      b.     Status post stenting of the right coronary artery April       2000.      c.     Non-obstructive coronary disease by recent catheterization       April 17, 2006:  left anterior descending 30% diffuse  luminal       irregularities; circumflex with mild luminal irregularities; right       coronary artery proximal and mid stent patent with less than 10%       renarrowing, 30% to 40% mid; posterior descending arteries mild       luminal irregularities.  4. Preserved left ventricular function.  5. Mild mitral regurgitation.  6. Mild to moderate tricuspid regurgitation.  7. Hypertension, controlled.  8. Treated dyslipidemia.  9. History of blue toe syndrome dating back to 1999.      a.     Unclear whether or not this is atheroembolic disease or not       - always resolves on it's own.  10.Gastroesophageal reflux disease.  11.Bilateral renal artery stenosis of 1% to 59% by renal duplex      January 31, 2006.  12.History of cerebrovascular disease with abnormal carotid Doppler in      the past.      a.     Recent MRI revealing unremarkable right internal carotid       artery and unremarkable left internal carotid artery.  13.Diabetes mellitus.  14.Elevated ALT.   PLAN:  The patient presents today for post hospitalization followup.  She is doing well.  She continues to have bilateral shoulder and  intrascapular pain.  I think this is probably musculoskeletal in nature.  She ruled out for myocardial infarction, had non-obstructive disease on  her cardiac catheterization, had a negative D-dimer and normal appearing  chest x-ray. I discussed this with Dr. Lia Foyer.  No further  cardiovascular workup is warranted at this time.  She should continue  with conservative management and discuss further treatment of her back  pain and shoulder pain with her primary care physician.  She was  concerned about continuing on Altace.  I also discussed this with Dr.  Lia Foyer.  Given her normal creatinine of late and well controlled blood  pressure, we will hold off on any further workup of her renal artery  stenosis and continue her on her ACE-inhibitor, especially since she does have a history of  diabetes mellitus.  She should probably have a  follow up liver function test in about 3 months. I will have her see Dr.  Stuckey back in 3 months' time.  If her LFTs have not been done by that  time, we will go ahead and set those up at that appointment.      Richardson Dopp, PA-C  Electronically Signed      Loretha Brasil. Lia Foyer, MD, [facility]  Electronically Signed   SW/MedQ  DD: 05/05/2006  DT: 05/05/2006  Job #: [id number]   cc:   [name], M.D.

## 2010-06-29 NOTE — Assessment & Plan Note (Signed)
[facility] OFFICE NOTE   NAME:[name], Stacy                           MRN:          [id number]  DATE:01/20/2006                            DOB:          11/28/1926    Miss Stacy [name] is in for followup, overall she has been reasonably stable.  Laboratory studies done in June by Dr. Debria [name] revealed total cholesterol  at 112 with an LDL-C of 47, and an HDL of 50. She has not had chest  pain.    Her medications include;  1. Hydrochlorothiazide 25 mg daily.  2. Aspirin 325 mg daily.  3. Zetia 10 mg daily.  4. Plavix 75 mg daily.  5. Toprol 25 mg daily.  6. Altace 2.5 b.i.d.  7. Calcium daily.  8. Vitamin D daily.  9. Actonel once weekly.  10.Nexium 40 mg daily.  11.CoQ10 daily.  12.Crestor 10 mg daily.  13.Metformin 500 mg 1 t.i.d.  14.Zoloft 25 mg daily.  15.Premarin 0.625 mg.   On physical examination she is alert and oriented in no distress. Her  blood pressure is 102/66, pulse 68, cardiac rhythm regular without  significant murmur or rub. No definite abdominal bruits.   IMPRESSION:  1. Coronary artery disease, status post multiple multi vessel      stenting.  2. History of hypercholesterolemia on lipid lowering therapy.  3. History of hypertension.   PLAN:  1. Abdominal ultrasound to rule out renal artery stenosis given drug      therapy.  2. Continued follow up in cardiology clinic in 6 months.  3. Report any new episodes of blue toe syndrome.     Stacy [name]. Stacy Foyer, MD, [facility]  Electronically Signed    TDS/MedQ  DD: 03/24/2006  DT: 03/24/2006  Job #: [id number]

## 2010-06-29 NOTE — Op Note (Signed)
[facility]  Patient:    Stacy [name], Stacy [name]                         MRN: [id number] Proc. Date: 02/26/00 Adm. Date:  [id number] Attending:  Gomez Cleverly CC:         Elizabeth Sauer, M.D.  Varney Baas, M.D.  Loretha Brasil. Lia Foyer, M.D. [facility]   Operative Report  PREOPERATIVE DIAGNOSIS:  Right inguinal hernia.  POSTOPERATIVE DIAGNOSIS:  Right inguinal hernia.  OPERATION:  Repair of right inguinal hernia with Prolene mesh.  SURGEON:  Earnstine Regal, M.D.  ANESTHESIA:  General  ESTIMATED BLOOD LOSS:  Minimal.  PREPARATION:  Betadine.  COMPLICATIONS:  None.  INDICATIONS:  The patient is a 77 year old white female seen at the request of Dr. Glenna Fellows with right groin pain.  The patient was initially seen and evaluated in early December. She underwent a CT scan of the abdomen which showed a small right inguinal hernia containing incarcerated fatty tissue. The patient now comes to surgery for repair of right inguinal hernia.  DESCRIPTION OF PROCEDURE:  Procedure is done in OR #15 at the [facility].  The patient is brought to the operating room, placed in the supine position on the operating room table.  Following administration of general anesthesia, the patient is prepped and draped in the usual strict aseptic fashion.  After ascertaining an adequate level of anesthesia had been obtained, a right groin incision was made with a #10 blade.  Dissection was carried down through the subcutaneous tissues and hemostasis obtained with the electrocautery.  The external oblique fascia was incised in line with its fibers and extended through the external inguinal ring.  An obvious hernia sac extends into the right labia.  The sac is dissected away from the surrounding structures up to the level of the internal inguinal ring.  It is then reduced back within the peritoneal cavity.  The internal inguinal ring is closed with a 3-0 Vicryl  figure-of-eight suture.  Care is taken to preserve the ilioinguinal nerve.  Next, the floor of the inguinal canal is recreated with a sheet of Prolene mesh. The mesh is secured to the pubic tubercle and along the inguinal ligament with a running 2-0 Novofil suture.  Superior margin of the mesh is secured to the transversalis and internal oblique fascia with interrupted 2-0 Novofil sutures.  Local field block is placed with Marcaine. External oblique fascia is closed with interrupted 3-0 Vicryl sutures. Subcutaneous tissues were closed with interrupted 3-0 Vicryl sutures.  Skin edges are anesthetized with local anesthetic.  The skin edges are reapproximated with interrupted 4-0 Vicryl subcuticular sutures.  The wound is washed and dried and Benzoin and Steri-Strips are applied.  Sterile gauze dressings are applied.  The patient is awakened from anesthesia and brought to the recovery room in stable condition.  The patient tolerated the procedure well. DD:  02/27/00 TD:  02/27/00 Job: [id number]

## 2010-06-29 NOTE — Consult Note (Signed)
Stacy [name], Stacy [name]                  ACCOUNT NO.:  [id number]   MEDICAL RECORD NO.:  [id number]          PATIENT TYPE:  INP   LOCATION:  91                         FACILITY:  [place]   PHYSICIAN:  Barnetta Chapel A. Jacolyn Reedy, M.D.DATE OF BIRTH:  1927-01-26   DATE OF CONSULTATION:  DATE OF DISCHARGE:                                 CONSULTATION   DATE OF CONSULTATION:  April 17, 2006.   CHIEF COMPLAINT:  Dizziness and abnormal MRI with small-vessel disease.   HISTORY OF PRESENT ILLNESS:  Ms. Cassada is a 77 year old right-handed  white woman who was admitted with chest pain to rule out MI with a  history of previous coronary artery disease with intervention.  She  happen to mention to her doctor that morning that she had dizzy  episodes.  I am asked to see her for acute dizziness and vascular  disease.  The patient states that she has had these spells for many  years, describes them as happening about once a month where she will get  up in the morning feeling somewhat staggery with disequilibrium.  It is  fairly mild, and she does not fall.  She does not require assistance to  walk.  She goes about her usual business during the day, and it tends to  be worse in the morning and wane throughout the day.  She does not  describe vertigo or lightheadedness.  The episode on the day of  admission was just like all the rest of her episodes.   REVIEW OF SYSTEMS:  Positive for some mild headache.  She gives it a  2/10.  She has gotten some nitroglycerin, it appears from her chart, and  that could be related.  No slurred speech.  No dysphasia.  No numbness.  No weakness.  No incoordination of the upper extremities.  No double  vision.  No facial droop.   PAST MEDICAL HISTORY:  Significant for coronary artery disease status  post MI.  She has had stenting done once and angioplasty once.  She has  hypertension, hyperlipidemia, history of a right inguinal hernia repair,  appendectomy, gastroesophageal  reflux, hemorrhoids, and a known right  carotid stenosis which is mild and followed.   MEDICATIONS:  Her medications include:  1. Aspirin 325 mg daily.  2. Plavix 75 mg daily.  3. Calcium citrate.  4. Colace.  5. Zetia 10 mg daily.  6. Hydrochlorothiazide 12.5 mg daily.  7. Toprol 25 mg daily.  8. Protonix 80 mg daily.  9. Altace 2.5 mg twice daily.  10.Zoloft 25 mg daily.  11.Zocor 40 mg daily.  12.Tylenol 750 mg as needed.  13.Morphine 2-4 mg one hour as needed.  14.Nitrostat on an as needed basis.  15.She has also gotten nitroglycerin 50 mg intravenously continuous      drip.  She does not appear to be getting that anymore.  I do not      know if she received it once during the stay.  16.Senokot.  17.Valium 5 mg orally.  That was just on call for the catheterization  along with Benadryl on call for the catheterization.  She got      Pepcid and prednisone again just during the procedure.   ALLERGIES:  Sulfa and iodine-based dye.   SOCIAL HISTORY:  She is widowed, has one daughter.  She is a remote  smoker.  She is active.   FAMILY HISTORY:  Positive for uncles who had strokes.   OBJECTIVE:  VITAL SIGNS:  On exam, vital signs are temperature 97.5,  pulse 61, respirations 18, blood pressure 117/70, 96% saturations on  room air.  HEENT:  Head:  Normocephalic and atraumatic.  Neck is supple without  bruit on the left.  She does have a soft right bruit which is known and  followed.  HEART:  Regular rate and rhythm.  LUNGS:  Clear to auscultation.  EXTREMITIES:  Without edema.  The right lower extremity is restrained  since she just had her catheterization earlier.  NEUROLOGIC EXAM:  Mental status:  She is awake, alert, and oriented with  normal language and cognition.  Cranial nerves:  Pupils are equal and  reactive.  Visual fields are full.  Extraocular movements are intact.  Facial sensation is normal.  Facial motor activity is intact.  Hearing  is intact.  Palate is  symmetric, and tongue is midline.  On motor exam,  she has normal bulk, tone, and strength with 5/5 strength in both upper  extremities and the left lower extremity.  In the distal right lower  extremity, I cannot check hip flexion or knee flexion because of her  recent catheterization and restraints.  Deep tendon reflexes are 2+ with  downgoing toes.  Coordination:  Finger-to-nose and rapid alternating  movements are normal.  Coordination of finger-to-nose and heel-to-shin  are normal.  Sensation is intact to multiple modalities.   DATA REVIEW:  MRI shows very minimal small vessel disease and mild age-  appropriate atrophy.  Both of these are not at all unusual for her age  and medical history.  No significant posterior fossa lesions.  The MRA  shows mild atherosclerosis.   IMPRESSION:  1. Chronic episodic disequilibrium which suggests mild chronic      positional or orthostatic vertigo and disequilibrium, nothing to      suggest stroke by her current condition.  2. Mild small vessel ischemia, appropriate for her age and also      appropriate for her medical history.   RECOMMENDATIONS:  Would continue aspirin and Plavix for cardiac and  stroke prophylaxis.  Otherwise, I have no recommendations at this time.  Her symptoms are too infrequent to warrant performing the Brandt-Daroff  exercises on a daily basis or to treat with medication.  Please call if  we should see again.  Otherwise, we will sign off.      Catherine A. Jacolyn Reedy, M.D.  Electronically Signed     CAW/MEDQ  D:  04/17/2006  T:  04/18/2006  Job:  [id number]

## 2010-07-12 ENCOUNTER — Other Ambulatory Visit: Payer: Self-pay | Admitting: Dermatology

## 2010-09-20 ENCOUNTER — Other Ambulatory Visit: Payer: Self-pay | Admitting: *Deleted

## 2010-09-20 MED ORDER — ROSUVASTATIN CALCIUM 10 MG PO TABS: 10.0000 mg | ORAL_TABLET | Freq: Every day | ORAL | Status: DC

## 2010-09-20 MED ORDER — RAMIPRIL 2.5 MG PO CAPS: 2.5000 mg | ORAL_CAPSULE | Freq: Two times a day (BID) | ORAL | Status: DC

## 2010-10-10 ENCOUNTER — Other Ambulatory Visit: Payer: Self-pay | Admitting: *Deleted

## 2010-10-10 MED ORDER — HYDROCHLOROTHIAZIDE 25 MG PO TABS: 12.5000 mg | ORAL_TABLET | Freq: Every day | ORAL | Status: DC

## 2010-10-18 ENCOUNTER — Encounter: Payer: Self-pay | Admitting: Surgery

## 2010-11-26 ENCOUNTER — Encounter: Payer: Self-pay | Admitting: Cardiology

## 2010-11-26 ENCOUNTER — Ambulatory Visit (INDEPENDENT_AMBULATORY_CARE_PROVIDER_SITE_OTHER): Payer: Medicare Other | Admitting: Cardiology

## 2010-11-26 DIAGNOSIS — E785 Hyperlipidemia, unspecified: Secondary | ICD-10-CM

## 2010-11-26 DIAGNOSIS — I2510 Atherosclerotic heart disease of native coronary artery without angina pectoris: Secondary | ICD-10-CM

## 2010-11-26 DIAGNOSIS — I701 Atherosclerosis of renal artery: Secondary | ICD-10-CM

## 2010-11-26 DIAGNOSIS — I10 Essential (primary) hypertension: Secondary | ICD-10-CM

## 2010-11-26 MED ORDER — NITROGLYCERIN 0.4 MG SL SUBL: 0.4000 mg | SUBLINGUAL_TABLET | SUBLINGUAL | Status: DC | PRN

## 2010-11-26 MED ORDER — ROSUVASTATIN CALCIUM 10 MG PO TABS: 10.0000 mg | ORAL_TABLET | Freq: Every day | ORAL | Status: DC

## 2010-11-26 MED ORDER — RAMIPRIL 2.5 MG PO CAPS: 2.5000 mg | ORAL_CAPSULE | Freq: Two times a day (BID) | ORAL | Status: DC

## 2010-11-26 MED ORDER — METOPROLOL SUCCINATE ER 25 MG PO TB24: 25.0000 mg | ORAL_TABLET | Freq: Every day | ORAL | Status: DC

## 2010-11-26 NOTE — Assessment & Plan Note (Signed)
Recent labs from Dr. Henrene Pastor office.  LDL 60, Trig 170, and HDL 46.  A1c is 6.8%.

## 2010-11-26 NOTE — Assessment & Plan Note (Signed)
Currently stable.  Not having symptoms.  Continue medical therapy.

## 2010-11-26 NOTE — Patient Instructions (Signed)
Your physician wants you to follow-up in: 6 MONTHS.  You will receive a reminder letter in the mail two months in advance. If you don't receive a letter, please call our office to schedule the follow-up appointment.  Your physician recommends that you continue on your current medications as directed. Please refer to the Current Medication list given to you today.  Your physician has requested that you have a renal artery duplex. During this test, an ultrasound is used to evaluate blood flow to the kidneys. Allow one hour for this exam. Do not eat after midnight the day before and avoid carbonated beverages. Take your medications as you usually do.

## 2010-11-26 NOTE — Progress Notes (Signed)
HPI:  She is doing well.  Her daughter has some concerns that she has slightly reduced GFR, but her serum cr is 0.9.  I went into the details of the formula for calculation, and told her I did not think a renal consult was necessary at this point.  They were understanding.  No chest pain.  No shortness of breath, or progressive cardiac symptoms.    Current Outpatient Prescriptions  Medication Sig Dispense Refill  . aspirin 325 MG tablet Take 325 mg by mouth daily.        . Brompheniramine Maleate (LODRANE 24 PO) Take by mouth daily.        . cholecalciferol (VITAMIN D) 400 UNITS TABS Take 200 Units by mouth daily.        . clopidogrel (PLAVIX) 75 MG tablet Take 1 tablet (75 mg total) by mouth daily.  30 tablet  11  . Coenzyme Q10 (COQ-10 PO) 1 tab po qd       . cyanocobalamin 500 MCG tablet Take 500 mcg by mouth 2 (two) times daily.        Marland Kitchen esomeprazole (NEXIUM) 40 MG capsule Take 40 mg by mouth daily before breakfast.        . estrogens, conjugated, (PREMARIN) 0.625 MG tablet Take 0.625 mg by mouth daily. Take daily for 21 days then do not take for 7 days.       . ferrous sulfate 325 (65 FE) MG EC tablet Take 325 mg by mouth daily with breakfast.        . hydrochlorothiazide 25 MG tablet Take 0.5 tablets (12.5 mg total) by mouth daily.  15 tablet  6  . metoprolol succinate (TOPROL-XL) 25 MG 24 hr tablet Take 25 mg by mouth daily.        . Multiple Vitamins-Minerals (CENTRUM SILVER PO) 1 tab po qd       . nitroGLYCERIN (NITROSTAT) 0.4 MG SL tablet Place 0.4 mg under the tongue every 5 (five) minutes as needed.        . ramipril (ALTACE) 2.5 MG capsule Take 1 capsule (2.5 mg total) by mouth 2 (two) times daily.  60 capsule  5  . rosuvastatin (CRESTOR) 10 MG tablet Take 1 tablet (10 mg total) by mouth daily.  30 tablet  5  . venlafaxine (EFFEXOR) 37.5 MG tablet Take 37.5 mg by mouth. 1/4 tablet dailty         Allergies  Allergen Reactions  . Iohexol      Desc: HIVES- 13 HR PRE-MEDS ARE  REQUIRED-ASM- 03/21/05,SULFA,ADHESIVE TAPE   . Latex   . Sulfonamide Derivatives     REACTION: hives    Past Medical History  Diagnosis Date  . Atheroembolism   . Peptic ulcer disease   . GERD (gastroesophageal reflux disease)   . Hyperlipidemia   . Renal artery stenosis   . Hemorrhoids   . Osteopenia   . DM (diabetes mellitus)   . CAD (coronary artery disease)   . Arthritis   . CAD (coronary artery disease)   . Hypertension   . Myocardial infarction 03/15/95  . Myocardial infarction   . Heart murmur   . Leg pain   . Hiatal hernia     Past Surgical History  Procedure Date  . Cataract extraction   . Tonsillectomy   . Ptca   . Appendectomy   . Hemorrhoid surgery   . Coronary artery bypass graft   . Incisional hernia repair   . Basal cell  cancer removal     forehead    Family History  Problem Relation Age of Onset  . Diabetes    . Hypertension      History   Social History  . Marital Status: Widowed    Spouse Name: N/A    Number of Children: N/A  . Years of Education: N/A   Occupational History  . Not on file.   Social History Main Topics  . Smoking status: Former Smoker    Types: Cigarettes    Quit date: 10/05/1998  . Smokeless tobacco: Not on file  . Alcohol Use: Not on file  . Drug Use: Not on file  . Sexually Active: Not on file   Other Topics Concern  . Not on file   Social History Narrative  . No narrative on file    ROS: Please see the HPI.  All other systems reviewed and negative.  PHYSICAL EXAM:  BP 112/68  Pulse 61  Resp 18  Ht 5\' 4"  (1.626 m)  Wt 145 lb (65.772 kg)  BMI 24.89 kg/m2  General: Well developed, well nourished, in no acute distress. Head:  Normocephalic and atraumatic. Neck: no JVD Lungs: Clear to auscultation and percussion. Heart: Normal S1 and S2.  Minimal SEM, no rubs or gallops. PMI non displaced.   Abdomen:  Normal bowel sounds; soft; non tender; no organomegaly Pulses: Pulses normal in all 4  extremities. Extremities: No clubbing or cyanosis. No edema. Neurologic: Alert and oriented x 3.  EKG:  NSR.  INferior MI, old.  Anterolateral MI, old.    ASSESSMENT AND PLAN:

## 2010-11-26 NOTE — Assessment & Plan Note (Signed)
Due for RA ultrasound.

## 2010-11-26 NOTE — Assessment & Plan Note (Signed)
Stable at present.   

## 2010-11-27 ENCOUNTER — Other Ambulatory Visit (HOSPITAL_COMMUNITY): Payer: Self-pay | Admitting: Legal Medicine

## 2010-11-27 DIAGNOSIS — E041 Nontoxic single thyroid nodule: Secondary | ICD-10-CM

## 2010-11-29 ENCOUNTER — Other Ambulatory Visit (HOSPITAL_COMMUNITY): Payer: Self-pay | Admitting: Legal Medicine

## 2010-11-29 ENCOUNTER — Ambulatory Visit (HOSPITAL_COMMUNITY)
Admission: RE | Admit: 2010-11-29 | Discharge: 2010-11-29 | Disposition: A | Discharge status: Home / Self Care | Payer: Medicare Other | Source: Ambulatory Visit | Attending: Legal Medicine | Admitting: Legal Medicine

## 2010-11-29 DIAGNOSIS — E041 Nontoxic single thyroid nodule: Secondary | ICD-10-CM

## 2010-11-29 DIAGNOSIS — E042 Nontoxic multinodular goiter: Secondary | ICD-10-CM | POA: Insufficient documentation

## 2010-12-03 ENCOUNTER — Ambulatory Visit (INDEPENDENT_AMBULATORY_CARE_PROVIDER_SITE_OTHER): Payer: Medicare Other | Admitting: Surgery

## 2010-12-03 ENCOUNTER — Encounter: Payer: Self-pay | Admitting: Surgery

## 2010-12-03 ENCOUNTER — Other Ambulatory Visit (INDEPENDENT_AMBULATORY_CARE_PROVIDER_SITE_OTHER): Payer: Medicare Other | Admitting: *Deleted

## 2010-12-03 ENCOUNTER — Other Ambulatory Visit: Payer: Self-pay

## 2010-12-03 VITALS — BP 115/72 | HR 60 | Resp 14 | Ht 64.0 in | Wt 144.0 lb

## 2010-12-03 DIAGNOSIS — I6529 Occlusion and stenosis of unspecified carotid artery: Secondary | ICD-10-CM

## 2010-12-03 NOTE — Progress Notes (Signed)
[facility]   Patient name: Stacy [name] MRN: [id number] DOB: 10/04/1926 Sex: female     Chief Complaint  Patient presents with  . Carotid    follow up with labs    HISTORY OF PRESENT ILLNESS: The patient comes back today for followup. We have been monitoring her abdominal aortic aneurysm which is very small by ultrasound as well as her carotid occlusive disease. She is back today for surveillance study. She denies any symptoms. She specifically denies amaurosis fugax third speech numbness or weakness in either extremity. She has a history of a heart attack in 1997 she is diabetic beginning at age 71 she is medically treated for hypertension hypercholesterolemia. Overall though she is a very healthy and active  Past Medical History  Diagnosis Date  . Atheroembolism   . Peptic ulcer disease   . GERD (gastroesophageal reflux disease)   . Hyperlipidemia   . Renal artery stenosis   . Hemorrhoids   . Osteopenia   . DM (diabetes mellitus)   . CAD (coronary artery disease)   . CAD (coronary artery disease)   . Hypertension   . Myocardial infarction 03/15/95  . Myocardial infarction   . Heart murmur   . Leg pain   . Hiatal hernia   . Hiatal hernia   . Arthritis     Cervical DDD C5-6 and C6-7    Past Surgical History  Procedure Date  . Cataract extraction   . Tonsillectomy   . Ptca   . Appendectomy   . Hemorrhoid surgery   . Coronary artery bypass graft   . Incisional hernia repair   . Basal cell cancer removal     forehead    History   Social History  . Marital Status: Widowed    Spouse Name: N/A    Number of Children: N/A  . Years of Education: N/A   Occupational History  . Not on file.   Social History Main Topics  . Smoking status: Former Smoker    Types: Cigarettes    Quit date: 10/05/1998  . Smokeless tobacco: Not on file  . Alcohol Use: No  . Drug Use: No  . Sexually Active: Not on file   Other Topics Concern  . Not on  file   Social History Narrative  . No narrative on file    Family History  Problem Relation Age of Onset  . Diabetes    . Hypertension      Allergies as of 12/03/2010 - Review Complete 12/03/2010  Allergen Reaction Noted  . Iohexol  03/21/2005  . Latex  10/18/2010  . Sulfonamide derivatives  06/20/2008    Current Outpatient Prescriptions on File Prior to Visit  Medication Sig Dispense Refill  . aspirin 325 MG tablet Take 325 mg by mouth daily.        . Brompheniramine Maleate (LODRANE 24 PO) Take by mouth daily.        . cholecalciferol (VITAMIN D) 400 UNITS TABS Take 200 Units by mouth daily.        . clopidogrel (PLAVIX) 75 MG tablet Take 1 tablet (75 mg total) by mouth daily.  30 tablet  11  . Coenzyme Q10 (COQ-10 PO) 1 tab po qd       . cyanocobalamin 500 MCG tablet Take 500 mcg by mouth 2 (two) times daily.        Marland Kitchen esomeprazole (NEXIUM) 40 MG capsule Take 40 mg by mouth daily before breakfast.        .  estrogens, conjugated, (PREMARIN) 0.625 MG tablet Take 0.625 mg by mouth daily. Take daily for 21 days then do not take for 7 days.       . ferrous sulfate 325 (65 FE) MG EC tablet Take 325 mg by mouth daily with breakfast.        . hydrochlorothiazide 25 MG tablet Take 0.5 tablets (12.5 mg total) by mouth daily.  15 tablet  6  . metoprolol succinate (TOPROL-XL) 25 MG 24 hr tablet Take 1 tablet (25 mg total) by mouth daily.  30 tablet  11  . Multiple Vitamins-Minerals (CENTRUM SILVER PO) 1 tab po qd       . nitroGLYCERIN (NITROSTAT) 0.4 MG SL tablet Place 1 tablet (0.4 mg total) under the tongue every 5 (five) minutes as needed.  25 tablet  3  . ramipril (ALTACE) 2.5 MG capsule Take 1 capsule (2.5 mg total) by mouth 2 (two) times daily.  60 capsule  11  . rosuvastatin (CRESTOR) 10 MG tablet Take 1 tablet (10 mg total) by mouth daily.  30 tablet  11  . venlafaxine (EFFEXOR) 37.5 MG tablet Take 37.5 mg by mouth. 1/4 tablet dailty          REVIEW OF SYSTEMS: Negative except  for what is mentioned in the history of present illness PHYSICAL EXAMINATION: General: The patient appears their stated age.  Vital signs are BP 115/72  Pulse 60  Resp 14  Ht 5\' 4"  (1.626 m)  Wt 144 lb (65.318 kg)  BMI 24.72 kg/m2 Pulmonary: There is a good air exchange bilaterally without wheezing or rales.. Musculoskeletal: There are no major deformities.   Neurologic: No focal weakness or paresthesias are detected, Skin: There are no ulcer or rashes noted. Psychiatric: The patient has normal affect. Cardiovascular: There is a regular rate and rhythm without significant murmur appreciated.   Diagnostic Studies Carotid ultrasound today shows a progression of disease compared to prior study she now is in the 60-79% category on the right side the left side is in the 1-39% category  Assessment: Asymptomatic right carotid stenosis, small abdominal aortic aneurysm Plan: The patient will be now surveillance screening every 6 months. She will see the PA in 6 months with an ultrasound. I will plan on seeing her back in one year with a repeat carotid ultrasound. As long as she remains below 80% and asymptomatic I would not recommend any intervention for carotid occlusive disease. We will continue with medical management.  In regards to her abdominal aortic aneurysm, it was imaged last year and found to be very small we will consider reimaging it in the next 3-4 years  V. Leia Alf, M.D. [facility] Office: [phone]

## 2010-12-24 ENCOUNTER — Encounter (INDEPENDENT_AMBULATORY_CARE_PROVIDER_SITE_OTHER): Payer: Medicare Other | Admitting: Cardiology

## 2010-12-24 ENCOUNTER — Encounter (HOSPITAL_COMMUNITY): Payer: Self-pay | Admitting: Dentistry

## 2010-12-24 ENCOUNTER — Ambulatory Visit (HOSPITAL_COMMUNITY): Payer: Medicare Other | Admitting: Dentistry

## 2010-12-24 VITALS — BP 124/62 | HR 63 | Temp 97.3°F

## 2010-12-24 DIAGNOSIS — I2510 Atherosclerotic heart disease of native coronary artery without angina pectoris: Secondary | ICD-10-CM

## 2010-12-24 DIAGNOSIS — K056 Periodontal disease, unspecified: Secondary | ICD-10-CM

## 2010-12-24 DIAGNOSIS — I10 Essential (primary) hypertension: Secondary | ICD-10-CM

## 2010-12-24 DIAGNOSIS — E119 Type 2 diabetes mellitus without complications: Secondary | ICD-10-CM

## 2010-12-24 DIAGNOSIS — I701 Atherosclerosis of renal artery: Secondary | ICD-10-CM

## 2010-12-24 DIAGNOSIS — K036 Deposits [accretions] on teeth: Secondary | ICD-10-CM

## 2010-12-24 NOTE — Procedures (Unsigned)
CAROTID DUPLEX EXAM  INDICATION:  Followup carotid disease.  HISTORY: Diabetes:  Yes. Cardiac: MI. Hypertension:  Yes. Smoking:  Quit 2000. Previous Surgery: CV History:  Currently asymptomatic. Amaurosis Fugax No, Paresthesias No, Hemiparesis No                                      RIGHT             LEFT Brachial systolic pressure:         114               118 Brachial Doppler waveforms:         Normal            Normal Vertebral direction of flow:        Antegrade         Antegrade DUPLEX VELOCITIES (cm/sec) CCA peak systolic                   68                73 ECA peak systolic                   207               92 ICA peak systolic                   272               69 ICA end diastolic                   63                21 PLAQUE MORPHOLOGY:                  Mixed             Homogeneous PLAQUE AMOUNT:                      Moderate          Mild PLAQUE LOCATION:                    Bifurcation, ICA  ICA  IMPRESSION: 1. Right internal carotid artery velocity suggests a 60%-79% stenosis     which is an increase in velocity in comparison to last exam. 2. Left internal carotid artery velocity suggests 1%-39% stenosis. 3. Bilateral antegrade vertebral arteries.  ___________________________________________ V. Leia Alf, MD  EM/MEDQ  D:  12/03/2010  T:  12/03/2010  Job:  [id number]

## 2010-12-24 NOTE — Progress Notes (Signed)
  BP:   124/62                 P:  63                  T:   97.3   Stacy [name] presents for periodic oral exam, dental radiographs, and periodontal therapy. Premedication: None required. Medical Hx Update: No acute medical changes. Allergies/ADR's:  1. Sulfa 2. Xray Contrast Dye 3. Tape 4. Trinilast 5. Penicillin-"blue toe syndrome"   Medications: 1. Nexium 40mg  daily 2. Altace 2.5mg  BID 3. Toprol XL 25mg  daily 4. HCTZ 25mg  1/2 tablet daily 5. Plavix 75mg  daily 6. ASA 325 daily 8. Crestor 10mg  daily 9. Vitamin D3 400 iu 1 1/2 daily 11. NTG SL prn chest pain 12. Ferrous sulfate 325mg  daily 13 Centrum Silver daily 14. Co Enzyme Q 100 mg daily 15. Citracal 500mg  TID 16. Vitamin B12 500 mcg twice daily 17. Premarin Vaginal cream one time per week. 18. Venlafaxine 37.5 daily  DENTAL EXAM General:  Pt is , well-nourished female in no acute distress. Vitals: As above Extra oral exam : No lymphadenopathy. No TMJ S symptoms. Intraoral Exam: Incipient xerostomia.  No soft tissue lesions noted.  Periodontal:  Plaque minimal. Calculus:minimal  [name]: none  -- Periodontium --  Color/Texture: normal-- Architecture: Normal  Attached Gingiva: Normal--Frenum Attachments: Normal  Inflammation: Minimal  Bleeding: Minimal  Pockets: 1-95mm Caries: None. Multiple suboptimal restorations primarily from esthetic standpoint.  Endo: No acute pulpitis Sx's.  C&B: Multiple units. Esthetics of #8 is poor as previously discussed.  Prosth: None Occlusion: Poor occlusal scheme but stable occlusion. Radiographic Interpretation: Orthopantomogram and full series taken. There are missing teeth. There is incipient to moderate bone loss. There are no obvious periapical radiolucencies. There are multiple double restorations that appear to be acceptable.  Procedure:  1. Periodic oral examination  2. Orthopantogram 3. Full Series of dental radiographs (13 periapicals and 3 bite wings) 2. Adult prophy  completed with Kavo scaler, hand curettes.  Fluoride treatment given with hygiene instruction provided. Patient tolerated procedure well. Return to clinic in approximately 6 months. Call if problems arise before then. Dr. Teena Dunk

## 2011-01-01 ENCOUNTER — Telehealth: Payer: Self-pay | Admitting: Cardiology

## 2011-01-01 NOTE — Telephone Encounter (Signed)
FU Call: Pt daughter returning call to Lauren from yesterday. Please return pt daughter call to discuss further.

## 2011-01-01 NOTE — Telephone Encounter (Signed)
I spoke with Rodena Piety and made her aware of the pt's renal duplex results.

## 2011-01-08 ENCOUNTER — Encounter: Payer: Self-pay | Admitting: Cardiology

## 2011-03-04 DIAGNOSIS — E7800 Pure hypercholesterolemia, unspecified: Secondary | ICD-10-CM | POA: Diagnosis not present

## 2011-03-04 DIAGNOSIS — E119 Type 2 diabetes mellitus without complications: Secondary | ICD-10-CM | POA: Diagnosis not present

## 2011-03-04 DIAGNOSIS — Z79899 Other long term (current) drug therapy: Secondary | ICD-10-CM | POA: Diagnosis not present

## 2011-03-04 DIAGNOSIS — I10 Essential (primary) hypertension: Secondary | ICD-10-CM | POA: Diagnosis not present

## 2011-03-06 DIAGNOSIS — I10 Essential (primary) hypertension: Secondary | ICD-10-CM | POA: Diagnosis not present

## 2011-03-06 DIAGNOSIS — E7800 Pure hypercholesterolemia, unspecified: Secondary | ICD-10-CM | POA: Diagnosis not present

## 2011-03-06 DIAGNOSIS — E119 Type 2 diabetes mellitus without complications: Secondary | ICD-10-CM | POA: Diagnosis not present

## 2011-03-11 ENCOUNTER — Telehealth: Payer: Self-pay | Admitting: Cardiology

## 2011-03-11 DIAGNOSIS — M79606 Pain in leg, unspecified: Secondary | ICD-10-CM

## 2011-03-11 NOTE — Telephone Encounter (Signed)
I spoke with the pt's daughter and the pt is complaining of aching and hurting in her legs. The pt walks around the [facility] and has to stop due to leg pain.  The pt's daughter made her aware that the symptoms could be coming from Crestor.  The pt stopped Crestor on Thursday and her symptoms have started to improve.  I made Rodena Piety that the pt can remain off of this medication for 10 days -2 weeks.  She also wanted to know if the pt could get a CK drawn.  The pt will have this lab test checked on 03/12/11.  The pt has taken Zetia, Lescol, Pravachol, Lipitor and Zocor in the past.  Rodena Piety will call the office next week to let us know how the pt is doing off of Crestor.

## 2011-03-11 NOTE — Telephone Encounter (Signed)
New problem Pt's daughter calling about crestor. She said her legs have been aching a lot please call her back

## 2011-03-12 ENCOUNTER — Ambulatory Visit (INDEPENDENT_AMBULATORY_CARE_PROVIDER_SITE_OTHER): Payer: Medicare Other | Admitting: *Deleted

## 2011-03-12 DIAGNOSIS — E042 Nontoxic multinodular goiter: Secondary | ICD-10-CM | POA: Diagnosis not present

## 2011-03-12 DIAGNOSIS — E785 Hyperlipidemia, unspecified: Secondary | ICD-10-CM

## 2011-03-12 DIAGNOSIS — E119 Type 2 diabetes mellitus without complications: Secondary | ICD-10-CM | POA: Diagnosis not present

## 2011-03-18 ENCOUNTER — Other Ambulatory Visit (HOSPITAL_COMMUNITY): Payer: Self-pay | Admitting: Legal Medicine

## 2011-03-18 DIAGNOSIS — S0093XA Contusion of unspecified part of head, initial encounter: Secondary | ICD-10-CM

## 2011-03-18 DIAGNOSIS — M533 Sacrococcygeal disorders, not elsewhere classified: Secondary | ICD-10-CM | POA: Diagnosis not present

## 2011-03-18 DIAGNOSIS — S0190XA Unspecified open wound of unspecified part of head, initial encounter: Secondary | ICD-10-CM | POA: Diagnosis not present

## 2011-03-20 ENCOUNTER — Ambulatory Visit (HOSPITAL_COMMUNITY)
Admission: RE | Admit: 2011-03-20 | Discharge: 2011-03-20 | Disposition: A | Discharge status: Home / Self Care | Payer: Medicare Other | Source: Ambulatory Visit | Attending: Legal Medicine | Admitting: Legal Medicine

## 2011-03-20 ENCOUNTER — Other Ambulatory Visit (HOSPITAL_COMMUNITY): Payer: Self-pay | Admitting: Legal Medicine

## 2011-03-20 ENCOUNTER — Telehealth: Payer: Self-pay

## 2011-03-20 DIAGNOSIS — S0003XA Contusion of scalp, initial encounter: Secondary | ICD-10-CM | POA: Diagnosis not present

## 2011-03-20 DIAGNOSIS — I2510 Atherosclerotic heart disease of native coronary artery without angina pectoris: Secondary | ICD-10-CM

## 2011-03-20 DIAGNOSIS — G319 Degenerative disease of nervous system, unspecified: Secondary | ICD-10-CM | POA: Diagnosis not present

## 2011-03-20 DIAGNOSIS — Q762 Congenital spondylolisthesis: Secondary | ICD-10-CM | POA: Diagnosis not present

## 2011-03-20 DIAGNOSIS — S0093XA Contusion of unspecified part of head, initial encounter: Secondary | ICD-10-CM

## 2011-03-20 DIAGNOSIS — I10 Essential (primary) hypertension: Secondary | ICD-10-CM

## 2011-03-20 DIAGNOSIS — M533 Sacrococcygeal disorders, not elsewhere classified: Secondary | ICD-10-CM | POA: Diagnosis not present

## 2011-03-20 DIAGNOSIS — S0990XA Unspecified injury of head, initial encounter: Secondary | ICD-10-CM | POA: Diagnosis not present

## 2011-03-20 DIAGNOSIS — I672 Cerebral atherosclerosis: Secondary | ICD-10-CM | POA: Diagnosis not present

## 2011-03-20 DIAGNOSIS — E785 Hyperlipidemia, unspecified: Secondary | ICD-10-CM

## 2011-03-20 DIAGNOSIS — Z7902 Long term (current) use of antithrombotics/antiplatelets: Secondary | ICD-10-CM | POA: Diagnosis not present

## 2011-03-20 DIAGNOSIS — Z7982 Long term (current) use of aspirin: Secondary | ICD-10-CM | POA: Diagnosis not present

## 2011-03-20 DIAGNOSIS — W19XXXA Unspecified fall, initial encounter: Secondary | ICD-10-CM | POA: Insufficient documentation

## 2011-03-20 DIAGNOSIS — I701 Atherosclerosis of renal artery: Secondary | ICD-10-CM

## 2011-03-20 NOTE — Telephone Encounter (Signed)
I spoke with the pt's daughter and made her aware of CK results.  The pt has noticed improvement in her leg pain since she stopped Crestor. Rodena Piety does feel like the pt should be on a statin with her history.  I recommended that the pt try taking Crestor 5mg  and see if she can tolerate this dose.  If she can take this dose then Rodena Piety will call the office and schedule the pt to have a lipid and liver profile checked in 6-8 weeks.  Rodena Piety agreed with plan.  If the pt cannot tolerate this dose then we will have Dr Lia Foyer make further recommendations about cholesterol management.

## 2011-04-01 DIAGNOSIS — E119 Type 2 diabetes mellitus without complications: Secondary | ICD-10-CM | POA: Diagnosis not present

## 2011-04-01 DIAGNOSIS — H26499 Other secondary cataract, unspecified eye: Secondary | ICD-10-CM | POA: Diagnosis not present

## 2011-04-08 DIAGNOSIS — Z124 Encounter for screening for malignant neoplasm of cervix: Secondary | ICD-10-CM | POA: Diagnosis not present

## 2011-04-08 DIAGNOSIS — Z01419 Encounter for gynecological examination (general) (routine) without abnormal findings: Secondary | ICD-10-CM | POA: Diagnosis not present

## 2011-04-10 ENCOUNTER — Other Ambulatory Visit: Payer: Self-pay | Admitting: Obstetrics & Gynecology

## 2011-04-10 DIAGNOSIS — Z1231 Encounter for screening mammogram for malignant neoplasm of breast: Secondary | ICD-10-CM

## 2011-04-12 DIAGNOSIS — M1710 Unilateral primary osteoarthritis, unspecified knee: Secondary | ICD-10-CM | POA: Diagnosis not present

## 2011-04-12 DIAGNOSIS — M5430 Sciatica, unspecified side: Secondary | ICD-10-CM | POA: Diagnosis not present

## 2011-04-12 DIAGNOSIS — Q762 Congenital spondylolisthesis: Secondary | ICD-10-CM | POA: Diagnosis not present

## 2011-04-24 ENCOUNTER — Ambulatory Visit: Payer: Medicare Other

## 2011-04-24 ENCOUNTER — Other Ambulatory Visit: Payer: Medicare Other

## 2011-05-01 ENCOUNTER — Ambulatory Visit
Admission: RE | Admit: 2011-05-01 | Discharge: 2011-05-01 | Disposition: A | Discharge status: Home / Self Care | Payer: Medicare Other | Source: Ambulatory Visit | Attending: Obstetrics & Gynecology | Admitting: Obstetrics & Gynecology

## 2011-05-01 DIAGNOSIS — M949 Disorder of cartilage, unspecified: Secondary | ICD-10-CM | POA: Diagnosis not present

## 2011-05-01 DIAGNOSIS — M899 Disorder of bone, unspecified: Secondary | ICD-10-CM | POA: Diagnosis not present

## 2011-05-01 DIAGNOSIS — Z780 Asymptomatic menopausal state: Secondary | ICD-10-CM | POA: Diagnosis not present

## 2011-05-01 DIAGNOSIS — Z1231 Encounter for screening mammogram for malignant neoplasm of breast: Secondary | ICD-10-CM

## 2011-05-09 ENCOUNTER — Other Ambulatory Visit: Payer: Self-pay | Admitting: *Deleted

## 2011-05-09 MED ORDER — HYDROCHLOROTHIAZIDE 25 MG PO TABS: 12.5000 mg | ORAL_TABLET | Freq: Every day | ORAL | Status: DC

## 2011-05-28 DIAGNOSIS — E785 Hyperlipidemia, unspecified: Secondary | ICD-10-CM | POA: Diagnosis not present

## 2011-05-28 DIAGNOSIS — Z79899 Other long term (current) drug therapy: Secondary | ICD-10-CM | POA: Diagnosis not present

## 2011-05-28 DIAGNOSIS — E119 Type 2 diabetes mellitus without complications: Secondary | ICD-10-CM | POA: Diagnosis not present

## 2011-05-30 ENCOUNTER — Other Ambulatory Visit: Payer: Self-pay | Admitting: *Deleted

## 2011-05-30 DIAGNOSIS — S0190XA Unspecified open wound of unspecified part of head, initial encounter: Secondary | ICD-10-CM | POA: Diagnosis not present

## 2011-05-30 DIAGNOSIS — M533 Sacrococcygeal disorders, not elsewhere classified: Secondary | ICD-10-CM | POA: Diagnosis not present

## 2011-05-30 DIAGNOSIS — I6529 Occlusion and stenosis of unspecified carotid artery: Secondary | ICD-10-CM

## 2011-05-30 DIAGNOSIS — E119 Type 2 diabetes mellitus without complications: Secondary | ICD-10-CM | POA: Diagnosis not present

## 2011-05-30 DIAGNOSIS — S062X9A Diffuse traumatic brain injury with loss of consciousness of unspecified duration, initial encounter: Secondary | ICD-10-CM | POA: Diagnosis not present

## 2011-06-04 ENCOUNTER — Encounter: Payer: Self-pay | Admitting: Neurosurgery

## 2011-06-05 ENCOUNTER — Encounter: Payer: Self-pay | Admitting: Neurosurgery

## 2011-06-05 ENCOUNTER — Other Ambulatory Visit (INDEPENDENT_AMBULATORY_CARE_PROVIDER_SITE_OTHER): Payer: Medicare Other | Admitting: *Deleted

## 2011-06-05 ENCOUNTER — Ambulatory Visit (INDEPENDENT_AMBULATORY_CARE_PROVIDER_SITE_OTHER): Payer: Medicare Other | Admitting: Neurosurgery

## 2011-06-05 VITALS — BP 132/72 | HR 65 | Resp 16 | Ht 64.0 in | Wt 147.6 lb

## 2011-06-05 DIAGNOSIS — I6529 Occlusion and stenosis of unspecified carotid artery: Secondary | ICD-10-CM | POA: Diagnosis not present

## 2011-06-05 NOTE — Progress Notes (Signed)
[name] HISTORY AND PHYSICAL   CC: Six-month carotid duplex for surveillance of known carotid stenosis Referring Physician: Brabham  History of Present Illness: 78 year old female patient of Dr. Trula Slade seen for surveillance carotid stenosis. Patient seen with her daughter who states that the patient was told after a fall that she had a TIA per the CT that was done. Her daughter states that her primary care physician looked at the CT and said she had not had a TIA. The patient reports no signs or symptoms of a CVA, TIA, diplopia, dysphagia, amaurosis fugax, or word finding difficulty.  Past Medical History  Diagnosis Date  . Atheroembolism   . Peptic ulcer disease   . GERD (gastroesophageal reflux disease)   . Hyperlipidemia   . Renal artery stenosis   . Hemorrhoids   . Osteopenia   . DM (diabetes mellitus)   . CAD (coronary artery disease)   . CAD (coronary artery disease)   . Hypertension   . Myocardial infarction 03/15/95  . Myocardial infarction   . Heart murmur   . Leg pain   . Hiatal hernia   . Hiatal hernia   . Arthritis     Cervical DDD C5-6 and C6-7    ROS: [x]  Positive   [ ]  Denies    General: [ ]  Weight loss, [ ]  Fever, [ ]  chills Neurologic: [ ]  Dizziness, [ ]  Blackouts, [ ]  Seizure [ ]  Stroke, [ ]  "Mini stroke", [ ]  Slurred speech, [ ]  Temporary blindness; [ ]  weakness in arms or legs, [ ]  Hoarseness Cardiac: [ ]  Chest pain/pressure, [ ]  Shortness of breath at rest [ ]  Shortness of breath with exertion, [ ]  Atrial fibrillation or irregular heartbeat Vascular: [ ]  Pain in legs with walking, [ ]  Pain in legs at rest, [ ]  Pain in legs at night,  [ ]  Non-healing ulcer, [ ]  Blood clot in vein/DVT,   Pulmonary: [ ]  Home oxygen, [ ]  Productive cough, [ ]  Coughing up blood, [ ]  Asthma,  [ ]  Wheezing Musculoskeletal:  [ ]  Arthritis, [ ]  Low back pain, [ ]  Joint pain Hematologic: [ ]  Easy Bruising, [ ]  Anemia; [ ]   Hepatitis Gastrointestinal: [ ]  Blood in stool, [ ]  Gastroesophageal Reflux/heartburn, [ ]  Trouble swallowing Urinary: [ ]  chronic Kidney disease, [ ]  on HD - [ ]  MWF or [ ]  TTHS, [ ]  Burning with urination, [ ]  Difficulty urinating Skin: [ ]  Rashes, [ ]  Wounds Psychological: [ ]  Anxiety, [ ]  Depression   Social History History  Substance Use Topics  . Smoking status: Former Smoker    Types: Cigarettes    Quit date: 10/05/1998  . Smokeless tobacco: Not on file  . Alcohol Use: No    Family History Family History  Problem Relation Age of Onset  . Diabetes    . Hypertension      Allergies  Allergen Reactions  . Iohexol      Desc: HIVES- 13 HR PRE-MEDS ARE REQUIRED-ASM- 03/21/05,SULFA,ADHESIVE TAPE   . Latex   . Sulfonamide Derivatives     REACTION: hives    Current Outpatient Prescriptions  Medication Sig Dispense Refill  . aspirin 325 MG tablet Take 325 mg by mouth daily.        . cholecalciferol (VITAMIN D) 400 UNITS TABS Take 200 Units by mouth daily.        . clopidogrel (PLAVIX) 75 MG tablet Take 1 tablet (75 mg total) by mouth  daily.  30 tablet  11  . Coenzyme Q10 (COQ-10 PO) 1 tab po qd       . cyanocobalamin 500 MCG tablet Take 500 mcg by mouth 2 (two) times daily.        Marland Kitchen esomeprazole (NEXIUM) 40 MG capsule Take 40 mg by mouth daily before breakfast.        . ferrous sulfate 325 (65 FE) MG EC tablet Take 325 mg by mouth daily with breakfast.        . hydrochlorothiazide (HYDRODIURIL) 25 MG tablet Take 0.5 tablets (12.5 mg total) by mouth daily.  15 tablet  6  . meloxicam (MOBIC) 7.5 MG tablet Take 7.5 mg by mouth 2 (two) times daily.      . metoprolol succinate (TOPROL-XL) 25 MG 24 hr tablet Take 1 tablet (25 mg total) by mouth daily.  30 tablet  11  . Multiple Vitamins-Minerals (CENTRUM SILVER PO) 1 tab po qd       . nitroGLYCERIN (NITROSTAT) 0.4 MG SL tablet Place 1 tablet (0.4 mg total) under the tongue every 5 (five) minutes as needed.  25 tablet  3  .  ramipril (ALTACE) 2.5 MG capsule Take 1 capsule (2.5 mg total) by mouth 2 (two) times daily.  60 capsule  11  . rosuvastatin (CRESTOR) 5 MG tablet Take 10 mg by mouth daily.   1 tablet  0  . venlafaxine (EFFEXOR) 37.5 MG tablet Take 37.5 mg by mouth. 1/4 tablet dailty       . zolpidem (AMBIEN) 5 MG tablet Take 2.5 mg by mouth at bedtime as needed.      . Brompheniramine Maleate (LODRANE 24 PO) Take by mouth daily.        Marland Kitchen estrogens, conjugated, (PREMARIN) 0.625 MG tablet Take 0.625 mg by mouth daily. Take daily for 21 days then do not take for 7 days.         Physical Examination  Filed Vitals:   06/05/11 1410  BP: 132/72  Pulse: 65  Resp: 16    Body mass index is 25.34 kg/(m^2).  General:  WDWN in NAD Gait: Normal HEENT: WNL Eyes: Pupils equal Pulmonary: normal non-labored breathing , without Rales, rhonchi,  wheezing Cardiac: RRR, without  Murmurs, rubs or gallops; Abdomen: soft, NT, no masses Skin: no rashes, ulcers noted  Vascular Exam Pulses: Patient has 2+ radial pulses, DP and PT pulses are 1+ bilaterally. Carotid bruits: 2+ carotid pulses to auscultation bilaterally Extremities without ischemic changes, no Gangrene , no cellulitis; no open wounds;  Musculoskeletal: no muscle wasting or atrophy   Neurologic: A&O X 3; Appropriate Affect ; SENSATION: normal; MOTOR FUNCTION:  moving all extremities equally. Speech is fluent/normal  Non-Invasive Vascular Imaging CAROTID DUPLEX 06/05/2011  Right ICA 40 - 59 % stenosis Left ICA 0 - 19% stenosis   ASSESSMENT/PLAN: The patient is asymptomatic for CVA or TIA, there has been some fluctuation in her carotid duplex exams over the last 2 years without significant symptomology. Therefore I feel is appropriate to bring her back in 6 months for repeat carotid duplex, she and her daughter are in agreement. Their questions were encouraged and answered we will follow him up in 6 months in my clinic.  Beatris Ship ANP   Clinic MD:  Scot Dock

## 2011-06-13 ENCOUNTER — Other Ambulatory Visit: Payer: Self-pay

## 2011-06-13 MED ORDER — CLOPIDOGREL BISULFATE 75 MG PO TABS: 75.0000 mg | ORAL_TABLET | Freq: Every day | ORAL | Status: DC

## 2011-06-17 NOTE — Procedures (Unsigned)
CAROTID DUPLEX EXAM  INDICATION:  Carotid disease  HISTORY: Diabetes:  yes Cardiac:  MI Hypertension:  yes Smoking:  Previous. Previous Surgery:  No CV History:  Currently asymptomatic. Amaurosis Fugax No, Paresthesias No, Hemiparesis No                                      RIGHT             LEFT Brachial systolic pressure:         120               114 Brachial Doppler waveforms:         Normal            Normal Vertebral direction of flow:        Antegrade         Antegrade DUPLEX VELOCITIES (cm/sec) CCA peak systolic                   M=69/D=241        73 ECA peak systolic                   131               [id number] ICA peak systolic                   211               65 ICA end diastolic                   44                19 PLAQUE MORPHOLOGY:                  Mixed PLAQUE AMOUNT:                      Moderate          none PLAQUE LOCATION:                    ICA/ECA/distal CCA  IMPRESSION:  Doppler velocities suggest high end 40% to 59% stenosis of the right proximal internal carotid artery, however this appears to be mostly due to significant stenosis of the right distal common carotid artery/bifurcation region. No hemodynamically significant stenosis of the left internal carotid artery. No significant change noted in the maximum velocities of the bilateral carotid arteries.  ___________________________________________ V. Leia Alf, MD  CH/MEDQ  D:  06/07/2011  T:  06/07/2011  Job:  [id number]

## 2011-06-18 ENCOUNTER — Other Ambulatory Visit: Payer: Self-pay

## 2011-06-18 ENCOUNTER — Other Ambulatory Visit: Payer: Medicare Other

## 2011-06-18 DIAGNOSIS — I2510 Atherosclerotic heart disease of native coronary artery without angina pectoris: Secondary | ICD-10-CM

## 2011-06-18 DIAGNOSIS — E7800 Pure hypercholesterolemia, unspecified: Secondary | ICD-10-CM

## 2011-06-20 ENCOUNTER — Ambulatory Visit (INDEPENDENT_AMBULATORY_CARE_PROVIDER_SITE_OTHER): Payer: Medicare Other | Admitting: *Deleted

## 2011-06-20 DIAGNOSIS — E7800 Pure hypercholesterolemia, unspecified: Secondary | ICD-10-CM

## 2011-06-20 DIAGNOSIS — I2510 Atherosclerotic heart disease of native coronary artery without angina pectoris: Secondary | ICD-10-CM

## 2011-06-20 LAB — HEPATIC FUNCTION PANEL
ALT: 26 U/L (ref 0–35)
AST: 26 U/L (ref 0–37)
Albumin: 3.8 g/dL (ref 3.5–5.2)

## 2011-06-20 LAB — LIPID PANEL
Cholesterol: 161 mg/dL (ref 0–200)
HDL: 46 mg/dL (ref >39.00)
Triglycerides: 195 mg/dL — ABNORMAL HIGH (ref 0.0–149.0)

## 2011-06-20 LAB — CARDIAC PANEL: Total CK: 45 U/L (ref 7–177)

## 2011-06-25 ENCOUNTER — Ambulatory Visit (INDEPENDENT_AMBULATORY_CARE_PROVIDER_SITE_OTHER): Payer: Medicare Other | Admitting: Cardiology

## 2011-06-25 ENCOUNTER — Encounter: Payer: Self-pay | Admitting: Cardiology

## 2011-06-25 VITALS — BP 110/68 | HR 69 | Ht 64.0 in | Wt 148.8 lb

## 2011-06-25 DIAGNOSIS — E785 Hyperlipidemia, unspecified: Secondary | ICD-10-CM | POA: Diagnosis not present

## 2011-06-25 DIAGNOSIS — I6529 Occlusion and stenosis of unspecified carotid artery: Secondary | ICD-10-CM

## 2011-06-25 DIAGNOSIS — I2510 Atherosclerotic heart disease of native coronary artery without angina pectoris: Secondary | ICD-10-CM

## 2011-06-25 NOTE — Progress Notes (Signed)
HPI:  The patient is doing well. She's not having major problems. Overall she feels okay. She was on the higher dose of Crestor, she did feel like she had some muscle aches. Her main complaint now is discomfort in the right hip when she walks and also in the left knee she had her left knee injected and this is improved. Her right hip seems to be related to a spondylolisthesis. She denies any chest pain. When she reduced the dose of Crestor 5 mg, she was symptomatically improved. A long discussion today regarding the merits of higher versus lower dose medication we reviewed her laboratory studies as well.  Current Outpatient Prescriptions  Medication Sig Dispense Refill  . aspirin 325 MG tablet Take 325 mg by mouth daily.        . cholecalciferol (VITAMIN D) 400 UNITS TABS Take 400 Units by mouth daily.       . clopidogrel (PLAVIX) 75 MG tablet Take 1 tablet (75 mg total) by mouth daily.  30 tablet  1  . Coenzyme Q10 (COQ-10 PO) 1 tab po qd       . conjugated estrogens (PREMARIN) vaginal cream Place 0.5 g vaginally once a week.      . cyanocobalamin 500 MCG tablet Take 500 mcg by mouth 2 (two) times daily.        Marland Kitchen esomeprazole (NEXIUM) 40 MG capsule Take 40 mg by mouth daily before breakfast.        . ferrous sulfate 325 (65 FE) MG EC tablet Take 325 mg by mouth daily with breakfast.        . hydrochlorothiazide (HYDRODIURIL) 25 MG tablet Take 0.5 tablets (12.5 mg total) by mouth daily.  15 tablet  6  . meloxicam (MOBIC) 7.5 MG tablet Take 7.5 mg by mouth as needed.       . metoprolol succinate (TOPROL-XL) 25 MG 24 hr tablet Take 1 tablet (25 mg total) by mouth daily.  30 tablet  11  . Multiple Vitamins-Minerals (CENTRUM SILVER PO) 1 tab po qd       . nitroGLYCERIN (NITROSTAT) 0.4 MG SL tablet Place 1 tablet (0.4 mg total) under the tongue every 5 (five) minutes as needed.  25 tablet  3  . ramipril (ALTACE) 2.5 MG capsule Take 1 capsule (2.5 mg total) by mouth 2 (two) times daily.  60 capsule   11  . rosuvastatin (CRESTOR) 5 MG tablet Take 5 mg by mouth daily.   1 tablet  0  . venlafaxine (EFFEXOR) 37.5 MG tablet Take 37.5 mg by mouth. 1/4 tablet dailty       . zolpidem (AMBIEN) 5 MG tablet Take 5 mg by mouth at bedtime as needed.         Allergies  Allergen Reactions  . Iohexol      Desc: HIVES- 13 HR PRE-MEDS ARE REQUIRED-ASM- 03/21/05,SULFA,ADHESIVE TAPE   . Latex   . Sulfonamide Derivatives     REACTION: hives    Past Medical History  Diagnosis Date  . Atheroembolism   . Peptic ulcer disease   . GERD (gastroesophageal reflux disease)   . Hyperlipidemia   . Renal artery stenosis   . Hemorrhoids   . Osteopenia   . DM (diabetes mellitus)   . CAD (coronary artery disease)   . CAD (coronary artery disease)   . Hypertension   . Myocardial infarction 03/15/95  . Myocardial infarction   . Heart murmur   . Leg pain   . Hiatal  hernia   . Hiatal hernia   . Arthritis     Cervical DDD C5-6 and C6-7    Past Surgical History  Procedure Date  . Cataract extraction   . Tonsillectomy   . Ptca   . Appendectomy   . Hemorrhoid surgery   . Coronary artery bypass graft   . Incisional hernia repair   . Basal cell cancer removal     forehead    Family History  Problem Relation Age of Onset  . Diabetes    . Hypertension      History   Social History  . Marital Status: Widowed    Spouse Name: N/A    Number of Children: N/A  . Years of Education: N/A   Occupational History  . Not on file.   Social History Main Topics  . Smoking status: Former Smoker    Types: Cigarettes    Quit date: 10/05/1998  . Smokeless tobacco: Not on file  . Alcohol Use: No  . Drug Use: No  . Sexually Active: Not on file   Other Topics Concern  . Not on file   Social History Narrative  . No narrative on file    ROS: Please see the HPI.  All other systems reviewed and negative.  PHYSICAL EXAM:  BP 110/68  Pulse 69  Ht 5\' 4"  (1.626 m)  Wt 148 lb 12.8 oz (67.495 kg)   BMI 25.54 kg/m2  General: Well developed, well nourished, in no acute distress. Head:  Normocephalic and atraumatic. Neck: no JVD.  Right carotid bruit.   Lungs: Clear to auscultation and percussion. Heart: Normal S1 and S2.  No murmur, rubs or gallops.  Abdomen:  Normal bowel sounds; soft; non tender; no organomegaly Pulses: Pulses normal in all 4 extremities. Extremities: No clubbing or cyanosis. No edema. Neurologic: Alert and oriented x 3.  EKG:  NSR.  Anterior wall MI, old.  Inferior MI, old.    ASSESSMENT AND PLAN:

## 2011-06-25 NOTE — Assessment & Plan Note (Signed)
Being followed by [facility].

## 2011-06-25 NOTE — Patient Instructions (Signed)
Your physician wants you to follow-up in: 6 MONTHS.  You will receive a reminder letter in the mail two months in advance. If you don't receive a letter, please call our office to schedule the follow-up appointment.  Your physician recommends that you continue on your current medications as directed. Please refer to the Current Medication list given to you today.  

## 2011-06-25 NOTE — Assessment & Plan Note (Signed)
Discussed options in detail  She could increase Crestor or remain the same based on how she feels.  Ck is normal.

## 2011-06-25 NOTE — Assessment & Plan Note (Signed)
Perfectly stable at the present time.  For now, would not need further evaluation.

## 2011-06-26 DIAGNOSIS — M1710 Unilateral primary osteoarthritis, unspecified knee: Secondary | ICD-10-CM | POA: Diagnosis not present

## 2011-06-26 DIAGNOSIS — M5430 Sciatica, unspecified side: Secondary | ICD-10-CM | POA: Diagnosis not present

## 2011-06-27 DIAGNOSIS — N318 Other neuromuscular dysfunction of bladder: Secondary | ICD-10-CM | POA: Diagnosis not present

## 2011-06-27 DIAGNOSIS — N362 Urethral caruncle: Secondary | ICD-10-CM | POA: Diagnosis not present

## 2011-07-02 ENCOUNTER — Encounter (HOSPITAL_COMMUNITY): Payer: Self-pay | Admitting: Dentistry

## 2011-07-02 ENCOUNTER — Ambulatory Visit (HOSPITAL_COMMUNITY): Payer: Self-pay | Admitting: Dentistry

## 2011-07-02 DIAGNOSIS — K036 Deposits [accretions] on teeth: Secondary | ICD-10-CM

## 2011-07-02 DIAGNOSIS — K0530 Chronic periodontitis, unspecified: Secondary | ICD-10-CM

## 2011-07-02 DIAGNOSIS — I2510 Atherosclerotic heart disease of native coronary artery without angina pectoris: Secondary | ICD-10-CM

## 2011-07-02 NOTE — Progress Notes (Signed)
07/02/2011 Patient:            Stacy [name] Date of Birth:  [birth year] MRN:                [id number]  BP 109/65  Pulse 63  Temp(Src) 97.1 F (36.2 C) (Oral)  Stacy [name] is an 78 year old female that presents for periodic oral exam. Premedication: None required.  Medical Hx Update:  Past Medical History  Diagnosis Date  . Atheroembolism   . Peptic ulcer disease   . GERD (gastroesophageal reflux disease)   . Hyperlipidemia   . Renal artery stenosis   . Hemorrhoids   . Osteopenia   . DM (diabetes mellitus)   . CAD (coronary artery disease)   . CAD (coronary artery disease)   . Hypertension   . Myocardial infarction 03/15/95  . Myocardial infarction   . Heart murmur   . Leg pain   . Hiatal hernia   . Hiatal hernia   . Arthritis     Cervical DDD C5-6 and C6-7  . ALLERGIES/ADVERSE DRUG REACTIONS: Allergies  Allergen Reactions  . Iohexol      Desc: HIVES- 13 HR PRE-MEDS ARE REQUIRED-ASM- 03/21/05,SULFA,ADHESIVE TAPE   . Latex     Adhesive tape and EKG adhesive leads to rash  . Sulfonamide Derivatives     REACTION: hives   MEDICATIONS: Current Outpatient Prescriptions  Medication Sig Dispense Refill  . aspirin 325 MG tablet Take 325 mg by mouth daily.        . cholecalciferol (VITAMIN D) 400 UNITS TABS Take 400 Units by mouth daily.       . clopidogrel (PLAVIX) 75 MG tablet Take 1 tablet (75 mg total) by mouth daily.  30 tablet  1  . Coenzyme Q10 (COQ-10 PO) 1 tab po qd       . conjugated estrogens (PREMARIN) vaginal cream Place 0.5 g vaginally once a week.      . cyanocobalamin 500 MCG tablet Take 500 mcg by mouth 2 (two) times daily.        Marland Kitchen darifenacin (ENABLEX) 7.5 MG 24 hr tablet Take 7.5 mg by mouth daily.      Marland Kitchen esomeprazole (NEXIUM) 40 MG capsule Take 40 mg by mouth daily before breakfast.        . ferrous sulfate 325 (65 FE) MG EC tablet Take 325 mg by mouth daily with breakfast.        . hydrochlorothiazide (HYDRODIURIL) 25 MG tablet Take 0.5 tablets (12.5  mg total) by mouth daily.  15 tablet  6  . metoprolol succinate (TOPROL-XL) 25 MG 24 hr tablet Take 1 tablet (25 mg total) by mouth daily.  30 tablet  11  . Multiple Vitamins-Minerals (CENTRUM SILVER PO) 1 tab po qd       . nitroGLYCERIN (NITROSTAT) 0.4 MG SL tablet Place 1 tablet (0.4 mg total) under the tongue every 5 (five) minutes as needed.  25 tablet  3  . ramipril (ALTACE) 2.5 MG capsule Take 1 capsule (2.5 mg total) by mouth 2 (two) times daily.  60 capsule  11  . rosuvastatin (CRESTOR) 5 MG tablet Take 5 mg by mouth daily.   1 tablet  0  . venlafaxine (EFFEXOR) 37.5 MG tablet Take 37.5 mg by mouth. 1/4 tablet dailty       . meloxicam (MOBIC) 7.5 MG tablet Take 7.5 mg by mouth as needed.       . zolpidem (AMBIEN) 5 MG tablet  Take 5 mg by mouth at bedtime as needed.         C/C: Patient presents for periodic oral examination and dental cleaning.  DENTAL EXAM: General:  Patient is a well-developed, well-nourished female in no acute distress. Vitals: As above. Extraoral Exam: No palpable lymphadenopathy.  No acute TMJ Symptoms. Intraoral  Exam:  Normal Saliva. No Soft tissue lesions. Dentition:  As before. Caries: No caries noted. Endodontic: No history of pulpitits symptoms.  C&B: Stable. Multiple nondistended restorations but acceptable to the patient from functionality standpoint. Prosthodontic: None Occlusion: Stable  Procedures: 1. Periodic oral examination 2. Prophylaxis with sonic scaler and hand curettes. [place] Patient tolerated procedure well.  Plan:  1. Return to clinic in 6 months for an exam and cleaning. 2. Call if problems arise before then.   Lenn Cal 07/02/2011

## 2011-07-15 DIAGNOSIS — M5430 Sciatica, unspecified side: Secondary | ICD-10-CM | POA: Diagnosis not present

## 2011-07-15 DIAGNOSIS — M1710 Unilateral primary osteoarthritis, unspecified knee: Secondary | ICD-10-CM | POA: Diagnosis not present

## 2011-07-19 DIAGNOSIS — M5430 Sciatica, unspecified side: Secondary | ICD-10-CM | POA: Diagnosis not present

## 2011-07-22 DIAGNOSIS — M5430 Sciatica, unspecified side: Secondary | ICD-10-CM | POA: Diagnosis not present

## 2011-07-24 DIAGNOSIS — M5430 Sciatica, unspecified side: Secondary | ICD-10-CM | POA: Diagnosis not present

## 2011-07-29 DIAGNOSIS — M5430 Sciatica, unspecified side: Secondary | ICD-10-CM | POA: Diagnosis not present

## 2011-08-02 DIAGNOSIS — M5430 Sciatica, unspecified side: Secondary | ICD-10-CM | POA: Diagnosis not present

## 2011-08-06 DIAGNOSIS — M5430 Sciatica, unspecified side: Secondary | ICD-10-CM | POA: Diagnosis not present

## 2011-08-09 DIAGNOSIS — M5430 Sciatica, unspecified side: Secondary | ICD-10-CM | POA: Diagnosis not present

## 2011-08-12 DIAGNOSIS — M1710 Unilateral primary osteoarthritis, unspecified knee: Secondary | ICD-10-CM | POA: Diagnosis not present

## 2011-08-12 DIAGNOSIS — M5430 Sciatica, unspecified side: Secondary | ICD-10-CM | POA: Diagnosis not present

## 2011-08-14 DIAGNOSIS — M5430 Sciatica, unspecified side: Secondary | ICD-10-CM | POA: Diagnosis not present

## 2011-08-19 ENCOUNTER — Telehealth: Payer: Self-pay | Admitting: Cardiology

## 2011-08-19 NOTE — Telephone Encounter (Signed)
New Problem:    Patient called in needing a refill of her clopidogrel (PLAVIX) 75 MG tablet.

## 2011-08-20 DIAGNOSIS — M1710 Unilateral primary osteoarthritis, unspecified knee: Secondary | ICD-10-CM | POA: Diagnosis not present

## 2011-08-20 DIAGNOSIS — M5430 Sciatica, unspecified side: Secondary | ICD-10-CM | POA: Diagnosis not present

## 2011-08-20 MED ORDER — CLOPIDOGREL BISULFATE 75 MG PO TABS: 75.0000 mg | ORAL_TABLET | Freq: Every day | ORAL | Status: DC

## 2011-08-30 DIAGNOSIS — M1710 Unilateral primary osteoarthritis, unspecified knee: Secondary | ICD-10-CM | POA: Diagnosis not present

## 2011-09-03 DIAGNOSIS — E785 Hyperlipidemia, unspecified: Secondary | ICD-10-CM | POA: Diagnosis not present

## 2011-09-03 DIAGNOSIS — E119 Type 2 diabetes mellitus without complications: Secondary | ICD-10-CM | POA: Diagnosis not present

## 2011-09-03 DIAGNOSIS — I10 Essential (primary) hypertension: Secondary | ICD-10-CM | POA: Diagnosis not present

## 2011-09-05 DIAGNOSIS — E785 Hyperlipidemia, unspecified: Secondary | ICD-10-CM | POA: Diagnosis not present

## 2011-09-05 DIAGNOSIS — E119 Type 2 diabetes mellitus without complications: Secondary | ICD-10-CM | POA: Diagnosis not present

## 2011-09-05 DIAGNOSIS — I10 Essential (primary) hypertension: Secondary | ICD-10-CM | POA: Diagnosis not present

## 2011-09-10 DIAGNOSIS — E119 Type 2 diabetes mellitus without complications: Secondary | ICD-10-CM | POA: Diagnosis not present

## 2011-09-10 DIAGNOSIS — E042 Nontoxic multinodular goiter: Secondary | ICD-10-CM | POA: Diagnosis not present

## 2011-09-23 DIAGNOSIS — M1710 Unilateral primary osteoarthritis, unspecified knee: Secondary | ICD-10-CM | POA: Diagnosis not present

## 2011-09-30 DIAGNOSIS — M1710 Unilateral primary osteoarthritis, unspecified knee: Secondary | ICD-10-CM | POA: Diagnosis not present

## 2011-10-02 DIAGNOSIS — K921 Melena: Secondary | ICD-10-CM | POA: Diagnosis not present

## 2011-10-02 DIAGNOSIS — R1011 Right upper quadrant pain: Secondary | ICD-10-CM | POA: Diagnosis not present

## 2011-10-07 DIAGNOSIS — M1710 Unilateral primary osteoarthritis, unspecified knee: Secondary | ICD-10-CM | POA: Diagnosis not present

## 2011-10-15 DIAGNOSIS — M1710 Unilateral primary osteoarthritis, unspecified knee: Secondary | ICD-10-CM | POA: Diagnosis not present

## 2011-10-22 DIAGNOSIS — M1710 Unilateral primary osteoarthritis, unspecified knee: Secondary | ICD-10-CM | POA: Diagnosis not present

## 2011-10-30 ENCOUNTER — Ambulatory Visit (INDEPENDENT_AMBULATORY_CARE_PROVIDER_SITE_OTHER): Payer: Medicare Other | Admitting: Gastroenterology

## 2011-10-30 ENCOUNTER — Telehealth: Payer: Self-pay

## 2011-10-30 ENCOUNTER — Encounter: Payer: Self-pay | Admitting: Gastroenterology

## 2011-10-30 VITALS — BP 92/52 | HR 76 | Ht 62.5 in | Wt 148.4 lb

## 2011-10-30 DIAGNOSIS — R195 Other fecal abnormalities: Secondary | ICD-10-CM | POA: Diagnosis not present

## 2011-10-30 DIAGNOSIS — K219 Gastro-esophageal reflux disease without esophagitis: Secondary | ICD-10-CM | POA: Diagnosis not present

## 2011-10-30 DIAGNOSIS — K921 Melena: Secondary | ICD-10-CM

## 2011-10-30 MED ORDER — PEG-KCL-NACL-NASULF-NA ASC-C 100 G PO SOLR: 1.0000 | Freq: Once | ORAL | Status: DC

## 2011-10-30 NOTE — Patient Instructions (Addendum)
You have been scheduled for an endoscopy and colonoscopy with propofol. Please follow the written instructions given to you at your visit today. Please pick up your prep at the pharmacy within the next 1-3 days. If you use inhalers (even only as needed), please bring them with you on the day of your procedure.  You will be contacted by our office prior to your procedure for directions on holding your Plavix.  If you do not hear from our office 1 week prior to your scheduled procedure, please call [phone] to discuss.   cc: Lillard Anes, MD

## 2011-10-30 NOTE — Progress Notes (Signed)
History of Present Illness: This is an 78 year old female here today with her daughter. She relates an episode of small amounts of rectal blood per rectum after an episode of constipation. She was evaluated by her PCP and a DRE showed Hemoccult-positive stool. She was placed on MiraLax for constipation and that has resolved and she's not noted any further bleeding. In addition she has had problems with reflux, regurgitation, nausea and vomiting. These symptoms have all improved but not completely abated since beginning dexlansoprazole. She underwent colonoscopy in 2008 with only findings of internal/external hemorrhoids.  She underwent upper endoscopy in 2008 showing a deformity of the prepyloric area duodenal bulb consistent with prior ulcer disease. Denies weight loss, abdominal pain, constipation, diarrhea, change in stool caliber, melena, dysphagia, chest pain.  Review of Systems: Pertinent positive and negative review of systems were noted in the above HPI section. All other review of systems were otherwise negative.  Current Medications, Allergies, Past Medical History, Past Surgical History, Family History and Social History were reviewed in [employer] record.  Physical Exam: General: Well developed , well nourished, no acute distress Head: Normocephalic and atraumatic Eyes:  sclerae anicteric, EOMI Ears: Normal auditory acuity Mouth: No deformity or lesions Neck: Supple, no masses or thyromegaly Lungs: Clear throughout to auscultation Heart: Regular rate and rhythm; no murmurs, rubs or bruits Abdomen: Soft, non tender and non distended. No masses, hepatosplenomegaly or hernias noted. Normal Bowel sounds Rectal: Recent exam by Dr. Henrene Pastor for Hemoccult-positive stool and no other lesions. Musculoskeletal: Symmetrical with no gross deformities  Skin: No lesions on visible extremities Pulses:  Normal pulses noted Extremities: No clubbing, cyanosis, edema or deformities  noted Neurological: Alert oriented x 4, grossly nonfocal Cervical Nodes:  No significant cervical adenopathy Inguinal Nodes: No significant inguinal adenopathy Psychological:  Alert and cooperative. Normal mood and affect  Assessment and Recommendations:  1. Small volume hematochezia and Hemoccult-positive stool. Symptoms likely secondary to hemorrhoids. Rule out colorectal neoplasms and other disorders. The risks, benefits, and alternatives to colonoscopy with possible biopsy and possible polypectomy were discussed with the patient and they consent to proceed. The risks, benefits and alternatives to a 5 day hold of Plavix were discussed with the patient she consents to proceed. Obtain clearance from Drs. Stuckey and Brabham.  2. Reflux symptoms and intermittent nausea and vomiting. Rule out GERD, ulcer disease and upper gastrointestinal tract neoplasms. Continue dexlansoprazole. She takes aspirin 325 mg daily. The risks, benefits, and alternatives to endoscopy with possible biopsy and possible dilation were discussed with the patient and they consent to proceed.

## 2011-10-30 NOTE — Telephone Encounter (Signed)
  10/30/2011    RE: Stacy [name] DOB: [birth year] MRN: [id number]   Dear Dr. Lia Foyer,    We have scheduled the above patient for an endoscopic procedure. Our records show that she is on anticoagulation therapy.   Please advise as to how long the patient may come off her therapy of Plavix prior to the procedure, which is scheduled for 12/17/11.  Please fax back/ or route the completed form to [facility] at [phone].   Sincerely,  Marlon Pel, CMA  Please respond by 12/02/11 and route back to Marlon Pel, [place]

## 2011-11-04 NOTE — Telephone Encounter (Signed)
The patient can stop plavix 5 days prior to her procedure.  This would be ok.  Please contact the patient.

## 2011-11-04 NOTE — Telephone Encounter (Signed)
Advised patient that per Dr Lia Foyer, she may hold Plavix 5 days prior to procedure. Patient verbalizes understanding.

## 2011-11-14 ENCOUNTER — Other Ambulatory Visit: Payer: Self-pay | Admitting: *Deleted

## 2011-11-14 DIAGNOSIS — I10 Essential (primary) hypertension: Secondary | ICD-10-CM

## 2011-11-14 DIAGNOSIS — I701 Atherosclerosis of renal artery: Secondary | ICD-10-CM

## 2011-11-14 DIAGNOSIS — E785 Hyperlipidemia, unspecified: Secondary | ICD-10-CM

## 2011-11-14 DIAGNOSIS — I2510 Atherosclerotic heart disease of native coronary artery without angina pectoris: Secondary | ICD-10-CM

## 2011-11-14 MED ORDER — METOPROLOL SUCCINATE ER 25 MG PO TB24: 25.0000 mg | ORAL_TABLET | Freq: Every day | ORAL | Status: DC

## 2011-11-25 ENCOUNTER — Other Ambulatory Visit: Payer: Self-pay | Admitting: Internal Medicine

## 2011-11-25 DIAGNOSIS — E049 Nontoxic goiter, unspecified: Secondary | ICD-10-CM

## 2011-12-03 ENCOUNTER — Encounter: Payer: Self-pay | Admitting: Cardiology

## 2011-12-03 DIAGNOSIS — Z79899 Other long term (current) drug therapy: Secondary | ICD-10-CM | POA: Diagnosis not present

## 2011-12-03 DIAGNOSIS — R1013 Epigastric pain: Secondary | ICD-10-CM | POA: Diagnosis not present

## 2011-12-03 DIAGNOSIS — E782 Mixed hyperlipidemia: Secondary | ICD-10-CM | POA: Diagnosis not present

## 2011-12-03 DIAGNOSIS — E049 Nontoxic goiter, unspecified: Secondary | ICD-10-CM | POA: Diagnosis not present

## 2011-12-03 DIAGNOSIS — K921 Melena: Secondary | ICD-10-CM | POA: Diagnosis not present

## 2011-12-03 DIAGNOSIS — I10 Essential (primary) hypertension: Secondary | ICD-10-CM | POA: Diagnosis not present

## 2011-12-03 DIAGNOSIS — R1011 Right upper quadrant pain: Secondary | ICD-10-CM | POA: Diagnosis not present

## 2011-12-03 DIAGNOSIS — E119 Type 2 diabetes mellitus without complications: Secondary | ICD-10-CM | POA: Diagnosis not present

## 2011-12-04 ENCOUNTER — Telehealth: Payer: Self-pay | Admitting: Gastroenterology

## 2011-12-04 ENCOUNTER — Other Ambulatory Visit: Payer: Self-pay

## 2011-12-04 DIAGNOSIS — M1710 Unilateral primary osteoarthritis, unspecified knee: Secondary | ICD-10-CM | POA: Diagnosis not present

## 2011-12-04 NOTE — Telephone Encounter (Signed)
error 

## 2011-12-05 DIAGNOSIS — E785 Hyperlipidemia, unspecified: Secondary | ICD-10-CM | POA: Diagnosis not present

## 2011-12-05 DIAGNOSIS — E119 Type 2 diabetes mellitus without complications: Secondary | ICD-10-CM | POA: Diagnosis not present

## 2011-12-05 DIAGNOSIS — I10 Essential (primary) hypertension: Secondary | ICD-10-CM | POA: Diagnosis not present

## 2011-12-06 ENCOUNTER — Encounter: Payer: Self-pay | Admitting: Surgery

## 2011-12-09 ENCOUNTER — Encounter: Payer: Self-pay | Admitting: Surgery

## 2011-12-09 ENCOUNTER — Ambulatory Visit (INDEPENDENT_AMBULATORY_CARE_PROVIDER_SITE_OTHER): Payer: Medicare Other | Admitting: Surgery

## 2011-12-09 ENCOUNTER — Other Ambulatory Visit (INDEPENDENT_AMBULATORY_CARE_PROVIDER_SITE_OTHER): Payer: Medicare Other | Admitting: *Deleted

## 2011-12-09 VITALS — BP 128/66 | HR 54 | Resp 16 | Ht 64.0 in | Wt 149.4 lb

## 2011-12-09 DIAGNOSIS — I6529 Occlusion and stenosis of unspecified carotid artery: Secondary | ICD-10-CM

## 2011-12-09 DIAGNOSIS — M79609 Pain in unspecified limb: Secondary | ICD-10-CM | POA: Diagnosis not present

## 2011-12-09 NOTE — Addendum Note (Signed)
Addended by: Mena Goes on: 12/09/2011 12:20 PM   Modules accepted: Orders

## 2011-12-09 NOTE — Progress Notes (Signed)
[facility] [place]   Patient name: Stacy [name] MRN: [id number] DOB: [birth year] Sex: female     Chief Complaint  Patient presents with  . Carotid    One year f/up with vascular Lab study today.  CC:  Patient has pain with walking right leg, duration one year on going.    HISTORY OF PRESENT ILLNESS: The patient is back today for her routine visit. I am following her for both carotid occlusive disease and a small aortic aneurysm. She is scheduled to have a colonoscopy and wants clearance. Regarding her carotid occlusive disease, she denies any neurologic symptoms. Specifically she denies numbness or weakness in either extremity. She denies slurred speech. She denies amaurosis fugax. She has no abdominal pain at this time.  Past Medical History  Diagnosis Date  . Atheroembolism   . Peptic ulcer disease   . GERD (gastroesophageal reflux disease)   . Hyperlipidemia   . Renal artery stenosis   . Hemorrhoids   . Osteopenia   . DM (diabetes mellitus)   . CAD (coronary artery disease)   . Chronic kidney disease, stage III (moderate)   . Hypertension   . Myocardial infarction 03/15/95  . Heart murmur   . Leg pain   . Hiatal hernia   . Arthritis     Cervical DDD C5-6 and C6-7  . Depression   . Thyroid nodule   . History of shingles   . DDD (degenerative disc disease)   . Blue toe syndrome   . Hemorrhoid     Past Surgical History  Procedure Date  . Cataract extraction   . Tonsillectomy   . Ptca   . Appendectomy   . Hemorrhoid surgery   . Coronary artery bypass graft   . Incisional hernia repair   . Basal cell cancer removal     forehead  . Dilation and curettage of uterus   . Endometrial biopsy   . Inguinal hernia repair   . Squamous cell carcinoma excision     left lower leg    History   Social History  . Marital Status: Widowed    Spouse Name: N/A    Number of Children: 1  . Years of Education: N/A   Occupational History  . Retired    [employer]   Social History Main Topics  . Smoking status: Former Smoker -- 1.0 packs/day for 54 years    Types: Cigarettes    Quit date: 10/05/1998  . Smokeless tobacco: Never Used  . Alcohol Use: No  . Drug Use: No  . Sexually Active: Not on file   Other Topics Concern  . Not on file   Social History Narrative  . No narrative on file    Family History  Problem Relation Age of Onset  . Diabetes Father   . Prostate cancer Father     meta to colon  . Cancer Father   . Parkinsonism Mother   . Stroke Paternal Uncle     Allergies as of 12/09/2011 - Review Complete 12/09/2011  Allergen Reaction Noted  . Iohexol  03/21/2005  . Latex  10/18/2010  . Sulfonamide derivatives  06/20/2008    Current Outpatient Prescriptions on File Prior to Visit  Medication Sig Dispense Refill  . aspirin 325 MG tablet Take 325 mg by mouth daily.        . cholecalciferol (VITAMIN D) 400 UNITS TABS Take 400 Units by mouth daily.       . clopidogrel (  PLAVIX) 75 MG tablet Take 1 tablet (75 mg total) by mouth daily.  30 tablet  12  . Coenzyme Q10 (COQ-10 PO) Take 100 mg by mouth daily. 1 tab po qd      . conjugated estrogens (PREMARIN) vaginal cream 0.5 g once a week. .625 mg once weekly      . cyanocobalamin 500 MCG tablet Take 500 mcg by mouth 2 (two) times daily.        Marland Kitchen dexlansoprazole (DEXILANT) 60 MG capsule Take 60 mg by mouth daily.      . hydrochlorothiazide (HYDRODIURIL) 25 MG tablet Take 0.5 tablets (12.5 mg total) by mouth daily.  15 tablet  6  . ibuprofen (ADVIL) 200 MG tablet Take 200 mg by mouth as needed.      . metoprolol succinate (TOPROL-XL) 25 MG 24 hr tablet Take 1 tablet (25 mg total) by mouth daily.  30 tablet  3  . Multiple Vitamins-Minerals (CENTRUM SILVER PO) 1 tab po qd       . nitroGLYCERIN (NITROSTAT) 0.4 MG SL tablet Place 1 tablet (0.4 mg total) under the tongue every 5 (five) minutes as needed.  25 tablet  3  . PARoxetine (PAXIL) 10 MG tablet Take 10 mg by mouth  daily.      . peg 3350 powder (MOVIPREP) 100 G SOLR Take 1 kit (100 g total) by mouth once.  1 kit  0  . polyethylene glycol powder (GLYCOLAX/MIRALAX) powder Take 17 g by mouth daily.      . ramipril (ALTACE) 2.5 MG capsule Take 1 capsule (2.5 mg total) by mouth 2 (two) times daily.  60 capsule  11  . rosuvastatin (CRESTOR) 5 MG tablet Take 10 mg by mouth daily.   1 tablet  0     REVIEW OF SYSTEMS: Cardiovascular: No chest pain, chest pressure, palpitations, orthopnea, or dyspnea on exertion. No claudication or rest pain,  No history of DVT or phlebitis. Pulmonary: No productive cough, asthma or wheezing. Neurologic: No weakness, paresthesias, aphasia, or amaurosis. No dizziness. Hematologic: No bleeding problems or clotting disorders. Musculoskeletal: No joint pain or joint swelling. Gastrointestinal: No blood in stool or hematemesis Genitourinary: No dysuria or hematuria. Psychiatric:: No history of major depression. Integumentary: No rashes or ulcers. Constitutional: No fever or chills.  PHYSICAL EXAMINATION:   Vital signs are BP 128/66  Pulse 54  Resp 16  Ht 5\' 4"  (1.626 m)  Wt 149 lb 6.4 oz (67.767 kg)  BMI 25.64 kg/m2  SpO2 100% General: The patient appears their stated age. HEENT:  No gross abnormalities Pulmonary:  Non labored breathing Abdomen: Soft and non-tender. Aorta is not palpable Musculoskeletal: There are no major deformities. Neurologic: No focal weakness or paresthesias are detected, Skin: There are no ulcer or rashes noted. Psychiatric: The patient has normal affect. Cardiovascular: There is a regular rate and rhythm without significant murmur appreciated. No carotid bruits.   Diagnostic Studies Carotid duplex: The patient's right carotid stenosis remains stable in the 60-79% stenosis. There is no evidence of stenosis in the left carotid artery.  Assessment: #1 abdominal aortic aneurysm #2 carotid occlusive disease Plan: Abdominal aortic aneurysm:  When I initially saw her this was very small in size. It measured 2.2 cm. This was in 2011. Had recommended re\re imaging her several years later. I would like to have this evaluated in one year. This will need to be ordered in 6 months when she comes back for her carotid exam. Carotid occlusive disease: The patient  remained asymptomatic. She is at the low end of the 60-79% category. She will come back in 6 months for a repeat study.  The patient is cleared from my perspective to proceed with colonoscopy.  Eldridge Abrahams, M.D. [facility] [place] Office: [phone] Pager:  [phone]

## 2011-12-11 ENCOUNTER — Telehealth: Payer: Self-pay | Admitting: Gastroenterology

## 2011-12-12 ENCOUNTER — Other Ambulatory Visit: Payer: Self-pay | Admitting: Cardiology

## 2011-12-12 NOTE — Telephone Encounter (Signed)
Called daughter and this number is a pager number. Left numeric message.

## 2011-12-12 NOTE — Telephone Encounter (Signed)
Notified patient's daughter that she can hold her Plavix 5 days before her procedure.

## 2011-12-17 ENCOUNTER — Encounter: Payer: Self-pay | Admitting: Gastroenterology

## 2011-12-17 ENCOUNTER — Ambulatory Visit (AMBULATORY_SURGERY_CENTER): Payer: Medicare Other | Admitting: Gastroenterology

## 2011-12-17 VITALS — BP 121/57 | HR 58 | Temp 97.7°F | Resp 62 | Ht 62.0 in | Wt 148.0 lb

## 2011-12-17 DIAGNOSIS — K625 Hemorrhage of anus and rectum: Secondary | ICD-10-CM | POA: Diagnosis not present

## 2011-12-17 DIAGNOSIS — D126 Benign neoplasm of colon, unspecified: Secondary | ICD-10-CM

## 2011-12-17 DIAGNOSIS — K2100 Gastro-esophageal reflux disease with esophagitis, without bleeding: Secondary | ICD-10-CM

## 2011-12-17 DIAGNOSIS — K921 Melena: Secondary | ICD-10-CM

## 2011-12-17 DIAGNOSIS — R195 Other fecal abnormalities: Secondary | ICD-10-CM

## 2011-12-17 DIAGNOSIS — K219 Gastro-esophageal reflux disease without esophagitis: Secondary | ICD-10-CM | POA: Diagnosis not present

## 2011-12-17 MED ORDER — SODIUM CHLORIDE 0.9 % IV SOLN: 500.0000 mL | INTRAVENOUS | Status: DC

## 2011-12-17 NOTE — Progress Notes (Signed)
Patient did not experience any of the following events: a burn prior to discharge; a fall within the facility; wrong site/side/patient/procedure/implant event; or a hospital transfer or hospital admission upon discharge from the facility. (G8907) Patient did not have preoperative order for IV antibiotic SSI prophylaxis. (G8918)  

## 2011-12-17 NOTE — Op Note (Signed)
[facility]  [address] [place], [address]   COLONOSCOPY PROCEDURE REPORT  PATIENT: Stacy [name], Stacy [name]  MR#: [id number] BIRTHDATE: 1926-07-24 , 56  yrs. old GENDER: Female ENDOSCOPIST: Ladene Artist, MD, [facility] PROCEDURE DATE:  12/17/2011 PROCEDURE:   Colonoscopy with snare polypectomy and Colonoscopy with biopsy ASA CLASS:   Class II INDICATIONS:hematochezia and heme-positive stool. MEDICATIONS: MAC sedation, administered by CRNA and propofol (Diprivan) 80mg  IV DESCRIPTION OF PROCEDURE:   After the risks benefits and alternatives of the procedure were thoroughly explained, informed consent was obtained.  A digital rectal exam revealed no abnormalities of the rectum.   The LB [id number]  endoscope was introduced through the anus and advanced to the cecum, which was identified by both the appendix and ileocecal valve. No adverse events experienced.   The quality of the prep was excellent, using MoviPrep  The instrument was then slowly withdrawn as the colon was fully examined.  COLON FINDINGS: A sessile polyp measuring 4 mm in size was found at the hepatic flexure.  A polypectomy was performed with cold forceps.  The resection was complete and the polyp tissue was completely retrieved.   A sessile polyp measuring 5 mm in size was found in the descending colon.  A polypectomy was performed with a cold snare.  The resection was complete and the polyp tissue was completely retrieved.   The colon was otherwise normal.  There was no diverticulosis, inflammation, polyps or cancers unless previously stated.  Retroflexed views revealed small internal hemorrhoids. The time to cecum=3 minutes 05 seconds.  Withdrawal time=9 minutes 53 seconds.  The scope was withdrawn and the procedure completed.  COMPLICATIONS: There were no complications.  ENDOSCOPIC IMPRESSION: 1.   Sessile polyp at the hepatic flexure; polypectomy performed with cold forceps 2.   Sessile  polyp in the descending colon; polypectomy performed with a cold snare 3.   Small internal hemorrhoids  RECOMMENDATIONS: 1.  Await pathology results 2.  Given your age, you will not need another colonoscopy for colon cancer screening or polyp surveillance.  These types of tests usually stop around the age 50.   eSigned:  Ladene Artist, MD, [facility] 12/17/2011 11:13 AM   cc: Reinaldo Meeker, MD

## 2011-12-17 NOTE — Progress Notes (Signed)
NAC-VSS-AWAKE RESPONSIVE NO ANESTHESIA COMPLICATION NOTED

## 2011-12-17 NOTE — Op Note (Addendum)
[place]  [address] [place], [id number]   ENDOSCOPY PROCEDURE REPORT  PATIENT: Stacy, [name]  MR#: [id number] BIRTHDATE: 1926/06/26 , 29  yrs. old GENDER: Female ENDOSCOPIST: Ladene Artist, MD, [facility] PROCEDURE DATE:  12/17/2011 PROCEDURE:  EGD w/ biopsy ASA CLASS:     Class II INDICATIONS:  history of GERD, heme + stool. MEDICATIONS: There was residual sedation effect present from prior procedure, MAC sedation, administered by CRNA, and propofol (Diprivan) 60mg  IV TOPICAL ANESTHETIC: none DESCRIPTION OF PROCEDURE: After the risks benefits and alternatives of the procedure were thoroughly explained, informed consent was obtained.  The LB GIF-H180 [id number] endoscope was introduced through the mouth and advanced to the second portion of the duodenum. Without limitations.  The instrument was slowly withdrawn as the mucosa was fully examined.   ESOPHAGUS: A benign appearing, mild stricture was found at the gastroesophageal junction.  Multiple biopsies were performed.   The esophagus was otherwise normal. STOMACH: Normal mucosa and normal gastric folds were found. Retroflexed views showed a small hiatal hernia. DUONENUM: Normal mucosa and folds in the buld and 2nd duodedum. The scope was then withdrawn from the patient and the procedure completed.  COMPLICATIONS: There were no complications.  ENDOSCOPIC IMPRESSION: 1.   Mild stricture at the gastroesophageal junction; multiple biopsies 2.   Small hiatal hernia  RECOMMENDATIONS: 1.  anti-reflux regimen 2.  await pathology results 3.  continue PPI 4.  resume Plavix today    eSigned:  Ladene Artist, MD, [facility] 12/17/2011 11:29 AM Revised: 12/17/2011 11:29 AM  [id number] Henrene Pastor, MD

## 2011-12-17 NOTE — Patient Instructions (Addendum)
YOU HAD AN ENDOSCOPIC PROCEDURE TODAY AT THE [facility]: Refer to the procedure report that was given to you for any specific questions about what was found during the examination.  If the procedure report does not answer your questions, please call your gastroenterologist to clarify.  If you requested that your care partner not be given the details of your procedure findings, then the procedure report has been included in a sealed envelope for you to review at your convenience later.  YOU SHOULD EXPECT: Some feelings of bloating in the abdomen. Passage of more gas than usual.  Walking can help get rid of the air that was put into your GI tract during the procedure and reduce the bloating. If you had a lower endoscopy (such as a colonoscopy or flexible sigmoidoscopy) you may notice spotting of blood in your stool or on the toilet paper. If you underwent a bowel prep for your procedure, then you may not have a normal bowel movement for a few days.  DIET: Your first meal following the procedure should be a light meal and then it is ok to progress to your normal diet.  A half-sandwich or bowl of soup is an example of a good first meal.  Heavy or fried foods are harder to digest and may make you feel nauseous or bloated.  Likewise meals heavy in dairy and vegetables can cause extra gas to form and this can also increase the bloating.  Drink plenty of fluids but you should avoid alcoholic beverages for 24 hours.  ACTIVITY: Your care partner should take you home directly after the procedure.  You should plan to take it easy, moving slowly for the rest of the day.  You can resume normal activity the day after the procedure however you should NOT DRIVE or use heavy machinery for 24 hours (because of the sedation medicines used during the test).    SYMPTOMS TO REPORT IMMEDIATELY: A gastroenterologist can be reached at any hour.  During normal business hours, 8:30 AM to 5:00 PM Monday through Friday,  call [phone].  After hours and on weekends, please call the GI answering service at [phone] who will take a message and have the physician on call contact you.   Following lower endoscopy (colonoscopy or flexible sigmoidoscopy):  Excessive amounts of blood in the stool  Significant tenderness or worsening of abdominal pains  Swelling of the abdomen that is new, acute  Fever of 100F or higher  Following upper endoscopy (EGD)  Vomiting of blood or coffee ground material  New chest pain or pain under the shoulder blades  Painful or persistently difficult swallowing  New shortness of breath  Fever of 100F or higher  Black, tarry-looking stools  FOLLOW UP: If any biopsies were taken you will be contacted by phone or by letter within the next 1-3 weeks.  Call your gastroenterologist if you have not heard about the biopsies in 3 weeks.  Our staff will call the home number listed on your records the next business day following your procedure to check on you and address any questions or concerns that you may have at that time regarding the information given to you following your procedure. This is a courtesy call and so if there is no answer at the home number and we have not heard from you through the emergency physician on call, we will assume that you have returned to your regular daily activities without incident.  SIGNATURES/CONFIDENTIALITY: You and/or your care   partner have signed paperwork which will be entered into your electronic medical record.  These signatures attest to the fact that that the information above on your After Visit Summary has been reviewed and is understood.  Full responsibility of the confidentiality of this discharge information lies with you and/or your care-partner. YOU HAD AN ENDOSCOPIC PROCEDURE TODAY AT [facility]: Refer to the procedure report that was given to you for any specific questions about what was found during the  examination.  If the procedure report does not answer your questions, please call your gastroenterologist to clarify.  If you requested that your care partner not be given the details of your procedure findings, then the procedure report has been included in a sealed envelope for you to review at your convenience later.  YOU SHOULD EXPECT: Some feelings of bloating in the abdomen. Passage of more gas than usual.  Walking can help get rid of the air that was put into your GI tract during the procedure and reduce the bloating. If you had a lower endoscopy (such as a colonoscopy or flexible sigmoidoscopy) you may notice spotting of blood in your stool or on the toilet paper. If you underwent a bowel prep for your procedure, then you may not have a normal bowel movement for a few days.  DIET: Your first meal following the procedure should be a light meal and then it is ok to progress to your normal diet.  A half-sandwich or bowl of soup is an example of a good first meal.  Heavy or fried foods are harder to digest and may make you feel nauseous or bloated.  Likewise meals heavy in dairy and vegetables can cause extra gas to form and this can also increase the bloating.  Drink plenty of fluids but you should avoid alcoholic beverages for 24 hours.  ACTIVITY: Your care partner should take you home directly after the procedure.  You should plan to take it easy, moving slowly for the rest of the day.  You can resume normal activity the day after the procedure however you should NOT DRIVE or use heavy machinery for 24 hours (because of the sedation medicines used during the test).    SYMPTOMS TO REPORT IMMEDIATELY: A gastroenterologist can be reached at any hour.  During normal business hours, 8:30 AM to 5:00 PM Monday through Friday, call [phone].  After hours and on weekends, please call the GI answering service at [phone] who will take a message and have the physician on call contact  you.   Following lower endoscopy (colonoscopy or flexible sigmoidoscopy):  Excessive amounts of blood in the stool  Significant tenderness or worsening of abdominal pains  Swelling of the abdomen that is new, acute  Fever of 100F or higher  Following upper endoscopy (EGD)  Vomiting of blood or coffee ground material  New chest pain or pain under the shoulder blades  Painful or persistently difficult swallowing  New shortness of breath  Fever of 100F or higher  Black, tarry-looking stools  FOLLOW UP: If any biopsies were taken you will be contacted by phone or by letter within the next 1-3 weeks.  Call your gastroenterologist if you have not heard about the biopsies in 3 weeks.  Our staff will call the home number listed on your records the next business day following your procedure to check on you and address any questions or concerns that you may have at that time regarding the information given to  you following your procedure. This is a courtesy call and so if there is no answer at the home number and we have not heard from you through the emergency physician on call, we will assume that you have returned to your regular daily activities without incident.  SIGNATURES/CONFIDENTIALITY: You and/or your care partner have signed paperwork which will be entered into your electronic medical record.  These signatures attest to the fact that that the information above on your After Visit Summary has been reviewed and is understood.  Full responsibility of the confidentiality of this discharge information lies with you and/or your care-partner.

## 2011-12-18 ENCOUNTER — Telehealth: Payer: Self-pay | Admitting: *Deleted

## 2011-12-18 NOTE — Telephone Encounter (Signed)
  Follow up Call-  Call back number 12/17/2011  Post procedure Call Back phone  # [phone]  Permission to leave phone message Yes    Pt was sleeping; spoke with her daughter.   Patient questions:  Do you have a fever, pain , or abdominal swelling? no Pain Score  0 *  Have you tolerated food without any problems? yes  Have you been able to return to your normal activities? yes  Do you have any questions about your discharge instructions: Diet   no Medications  no Follow up visit  no  Do you have questions or concerns about your Care? no  Actions: * If pain score is 4 or above: No action needed, pain <4.

## 2011-12-23 ENCOUNTER — Ambulatory Visit: Payer: Self-pay | Admitting: Cardiology

## 2011-12-23 ENCOUNTER — Encounter: Payer: Self-pay | Admitting: Gastroenterology

## 2011-12-24 ENCOUNTER — Ambulatory Visit: Payer: Self-pay | Admitting: Cardiology

## 2011-12-28 ENCOUNTER — Emergency Department (INDEPENDENT_AMBULATORY_CARE_PROVIDER_SITE_OTHER)
Admission: EM | Admit: 2011-12-28 | Discharge: 2011-12-28 | Disposition: A | Discharge status: Home / Self Care | Payer: Medicare Other | Source: Home / Self Care | Attending: Emergency Medicine | Admitting: Emergency Medicine

## 2011-12-28 ENCOUNTER — Encounter (HOSPITAL_COMMUNITY): Payer: Self-pay | Admitting: Emergency Medicine

## 2011-12-28 DIAGNOSIS — N390 Urinary tract infection, site not specified: Secondary | ICD-10-CM

## 2011-12-28 DIAGNOSIS — L219 Seborrheic dermatitis, unspecified: Secondary | ICD-10-CM

## 2011-12-28 LAB — POCT URINALYSIS DIP (DEVICE)
Bilirubin Urine: NEGATIVE
Hgb urine dipstick: NEGATIVE
Nitrite: NEGATIVE
Protein, ur: NEGATIVE mg/dL
Urobilinogen, UA: 0.2 mg/dL (ref 0.0–1.0)
pH: 7 (ref 5.0–8.0)

## 2011-12-28 MED ORDER — CEPHALEXIN 500 MG PO CAPS: 500.0000 mg | ORAL_CAPSULE | Freq: Three times a day (TID) | ORAL | Status: DC

## 2011-12-28 MED ORDER — PHENAZOPYRIDINE HCL 200 MG PO TABS: 200.0000 mg | ORAL_TABLET | Freq: Three times a day (TID) | ORAL | Status: DC | PRN

## 2011-12-28 MED ORDER — KETOCONAZOLE 2 % EX CREA: TOPICAL_CREAM | Freq: Every day | CUTANEOUS | Status: DC

## 2011-12-28 NOTE — ED Provider Notes (Signed)
Chief Complaint  Patient presents with  . Urinary Tract Infection    History of Present Illness:   The patient is an 78 year old female who had a colonoscopy on November 5. Ever since then she's had urinary symptoms with pain with urination, chills, low-grade fever of up to 99, has felt generalized achiness and soreness, and slight suprapubic pain. She denies any frequency, urgency, hematuria, or odor to her urine. She's had no nausea, vomiting, or back pain. She denies any prior history of urinary tract infections.  She also notes a long-standing history of itching of both external ear canals. Sometimes these will scab over and developed small sores.  Review of Systems:  Other than noted above, the patient denies any of the following symptoms: General:  No fevers, chills, sweats, aches, or fatigue. GI:  No abdominal pain, back pain, nausea, vomiting, diarrhea, or constipation. GU:  No dysuria, frequency, urgency, hematuria, or incontinence. GYN:  No discharge, itching, vulvar pain or lesions, pelvic pain, or abnormal vaginal bleeding.  [place]:  Past medical history, family history, social history, meds, and allergies were reviewed.  Physical Exam:   Vital signs:  BP 135/77  Pulse 65  Temp 99.1 F (37.3 C) (Oral)  Resp 16  SpO2 95% Gen:  Alert, oriented, in no distress. ENT: Exam of the ears reveals one small sore on the right external ear, otherwise normal. Lungs:  Clear to auscultation, no wheezes, rales or rhonchi. Heart:  Regular rhythm, no gallop or murmer. Abdomen:  Flat and soft. There was slight suprapubic pain to palpation.  No guarding, or rebound.  No hepato-splenomegaly or mass.  Bowel sounds were normally active.  No hernia. Back:  No CVA tenderness.  Skin:  Clear, warm and dry.  Labs:   Results for orders placed during the hospital encounter of 12/28/11  POCT URINALYSIS DIP (DEVICE)      Component Value Range   Glucose, UA NEGATIVE  NEGATIVE mg/dL   Bilirubin Urine  NEGATIVE  NEGATIVE   Ketones, ur NEGATIVE  NEGATIVE mg/dL   Specific Gravity, Urine 1.010  1.005 - 1.030   Hgb urine dipstick NEGATIVE  NEGATIVE   pH 7.0  5.0 - 8.0   Protein, ur NEGATIVE  NEGATIVE mg/dL   Urobilinogen, UA 0.2  0.0 - 1.0 mg/dL   Nitrite NEGATIVE  NEGATIVE   Leukocytes, UA SMALL (*) NEGATIVE     Other Labs Obtained at [facility]:  A urine culture was obtained.  Results are pending at this time and we will call about any positive results.  Assessment: The primary encounter diagnosis was UTI (lower urinary tract infection). A diagnosis of Seborrheic dermatitis was also pertinent to this visit.   Plan:   1.  The following meds were prescribed:   New Prescriptions   CEPHALEXIN (KEFLEX) 500 MG CAPSULE    Take 1 capsule (500 mg total) by mouth 3 (three) times daily.   KETOCONAZOLE (NIZORAL) 2 % CREAM    Apply topically daily.   PHENAZOPYRIDINE (PYRIDIUM) 200 MG TABLET    Take 1 tablet (200 mg total) by mouth 3 (three) times daily as needed for pain.   2.  The patient was instructed in symptomatic care and handouts were given. 3.  The patient was told to return if becoming worse in any way, if no better in 3 or 4 days, and given some red flag symptoms that would indicate earlier return. 4.  The patient was told to avoid intercourse for 10 days, get  extra fluids, and return for a follow up with her primary care doctor at the completion of treatment for a repeat UA and culture.     Harden Mo, MD 12/28/11 [id number]

## 2011-12-28 NOTE — ED Notes (Signed)
Pt c/o UTI x1 day... Sx include: freq urination, hurts to urinate, pelvic pain, body aches, chills, fevers... Denies: vomiting, nauseas, diarrhea, hematuria... Pt is alert w/no signs of distress

## 2011-12-30 LAB — URINE CULTURE

## 2011-12-31 ENCOUNTER — Ambulatory Visit (HOSPITAL_COMMUNITY)
Admission: RE | Admit: 2011-12-31 | Discharge: 2011-12-31 | Disposition: A | Discharge status: Home / Self Care | Payer: Medicare Other | Source: Ambulatory Visit | Attending: Internal Medicine | Admitting: Internal Medicine

## 2011-12-31 DIAGNOSIS — E042 Nontoxic multinodular goiter: Secondary | ICD-10-CM | POA: Diagnosis not present

## 2011-12-31 DIAGNOSIS — E049 Nontoxic goiter, unspecified: Secondary | ICD-10-CM

## 2011-12-31 NOTE — ED Notes (Signed)
Urine culture: 75,000 colonies E. Coli.  Pt. adequately treated with Keflex. Stacy [name] 12/31/2011

## 2012-01-14 ENCOUNTER — Telehealth: Payer: Self-pay | Admitting: Cardiology

## 2012-01-14 DIAGNOSIS — I2510 Atherosclerotic heart disease of native coronary artery without angina pectoris: Secondary | ICD-10-CM

## 2012-01-14 DIAGNOSIS — E7800 Pure hypercholesterolemia, unspecified: Secondary | ICD-10-CM

## 2012-01-14 NOTE — Telephone Encounter (Signed)
plz return call to pt daughter Rodena Piety pager # [phone] regarding lab order for pt.

## 2012-01-14 NOTE — Telephone Encounter (Signed)
Patient's daughter states  Pt has been taken crestor 10 mg once a day for two months. Patient's daughter states Dr. Maren Beach nurse said that pt needed to have blood work 6 to 8 weeks after increasing this medication. Pt's daughter is aware that pt has an appointment with Dr. Lia Foyer next week 01/20/12 pt. would like to have the lab prior appointment. I don't know all the labs needed except for Lipid panel and LFT and BMET. Daughter said the last time pt had also CK-MB. Pt's daughter is aware that to make sure what labs are needed, is better for Dr. Maren Beach nurse  To  call her this week; she would know what labs pt  Needs. Daughter verbalized understanding.

## 2012-01-15 NOTE — Telephone Encounter (Signed)
I spoke with Rodena Piety and the pt will come into the office tomorrow for fasting lab work.

## 2012-01-16 ENCOUNTER — Other Ambulatory Visit (INDEPENDENT_AMBULATORY_CARE_PROVIDER_SITE_OTHER): Payer: Medicare Other

## 2012-01-16 DIAGNOSIS — E7800 Pure hypercholesterolemia, unspecified: Secondary | ICD-10-CM

## 2012-01-16 DIAGNOSIS — I2510 Atherosclerotic heart disease of native coronary artery without angina pectoris: Secondary | ICD-10-CM | POA: Diagnosis not present

## 2012-01-16 LAB — CBC WITH DIFFERENTIAL/PLATELET
Basophils Absolute: 0 10*3/uL (ref 0.0–0.1)
Basophils Relative: 0.6 % (ref 0.0–3.0)
Eosinophils Absolute: 0.3 10*3/uL (ref 0.0–0.7)
HCT: 43.3 % (ref 36.0–46.0)
Hemoglobin: 14.3 g/dL (ref 12.0–15.0)
Lymphs Abs: 2.2 10*3/uL (ref 0.7–4.0)
MCHC: 33 g/dL (ref 30.0–36.0)
Monocytes Relative: 8.8 % (ref 3.0–12.0)
Neutro Abs: 2.9 10*3/uL (ref 1.4–7.7)
RBC: 4.85 Mil/uL (ref 3.87–5.11)
RDW: 13.7 % (ref 11.5–14.6)

## 2012-01-16 LAB — HEPATIC FUNCTION PANEL
ALT: 24 U/L (ref 0–35)
Bilirubin, Direct: 0.1 mg/dL (ref 0.0–0.3)
Total Protein: 6.3 g/dL (ref 6.0–8.3)

## 2012-01-16 LAB — BASIC METABOLIC PANEL
BUN: 18 mg/dL (ref 6–23)
CO2: 30 mEq/L (ref 19–32)
Calcium: 9 mg/dL (ref 8.4–10.5)
Creatinine, Ser: 1 mg/dL (ref 0.4–1.2)

## 2012-01-16 LAB — LIPID PANEL
Cholesterol: 136 mg/dL (ref 0–200)
HDL: 40.6 mg/dL (ref >39.00)
Total CHOL/HDL Ratio: 3
Triglycerides: 154 mg/dL — ABNORMAL HIGH (ref 0.0–149.0)

## 2012-01-20 ENCOUNTER — Ambulatory Visit: Payer: Self-pay | Admitting: Cardiology

## 2012-01-24 ENCOUNTER — Ambulatory Visit (INDEPENDENT_AMBULATORY_CARE_PROVIDER_SITE_OTHER): Payer: Medicare Other | Admitting: Cardiology

## 2012-01-24 ENCOUNTER — Encounter: Payer: Self-pay | Admitting: Cardiology

## 2012-01-24 VITALS — BP 122/70 | HR 62 | Ht 62.0 in | Wt 160.4 lb

## 2012-01-24 DIAGNOSIS — E785 Hyperlipidemia, unspecified: Secondary | ICD-10-CM

## 2012-01-24 DIAGNOSIS — I2510 Atherosclerotic heart disease of native coronary artery without angina pectoris: Secondary | ICD-10-CM

## 2012-01-24 DIAGNOSIS — I6529 Occlusion and stenosis of unspecified carotid artery: Secondary | ICD-10-CM

## 2012-01-24 DIAGNOSIS — R7309 Other abnormal glucose: Secondary | ICD-10-CM

## 2012-01-24 NOTE — Assessment & Plan Note (Signed)
Followed at [facility].

## 2012-01-24 NOTE — Patient Instructions (Addendum)
Your physician has requested that you have a lexiscan myoview. For further information please visit [email or web address]. Please follow instruction sheet, as given.  Your physician recommends that you schedule a follow-up appointment in: 1 MONTH with Dr Lia Foyer  Your physician recommends that you have lab work today: [id number]

## 2012-01-24 NOTE — Progress Notes (Signed)
HPI:  Patient is in for followup today. From a cardiac standpoint she is stable however her activity level has dropped fairly dramatically, and her daughter is particularly concerned as they both think this has been a pretty big drop.  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  She is not having chest pain, but she does have at times non exertional pain in the shoulders.  However, and specifically, there is no exertion related chest pain.    Current Outpatient Prescriptions  Medication Sig Dispense Refill  . aspirin 325 MG tablet Take 325 mg by mouth daily.        . cholecalciferol (VITAMIN D) 400 UNITS TABS Take 400 Units by mouth daily.       . clopidogrel (PLAVIX) 75 MG tablet Take 1 tablet (75 mg total) by mouth daily.  30 tablet  12  . Coenzyme Q10 (COQ-10 PO) Take 100 mg by mouth daily. 1 tab po qd      . conjugated estrogens (PREMARIN) vaginal cream 0.5 g once a week. .625 mg once weekly      . CRESTOR 10 MG tablet TAKE ONE TABLET DAILY  30 tablet  11  . cyanocobalamin 500 MCG tablet Take 500 mcg by mouth 2 (two) times daily.        Marland Kitchen dexlansoprazole (DEXILANT) 60 MG capsule Take 60 mg by mouth daily.      . hydrochlorothiazide (HYDRODIURIL) 25 MG tablet TAKE 1/2 TABLET EVERY DAY  15 tablet  6  . ibuprofen (ADVIL) 200 MG tablet Take 200 mg by mouth as needed.      Marland Kitchen ketoconazole (NIZORAL) 2 % cream Apply topically daily as needed.      . metoprolol succinate (TOPROL-XL) 25 MG 24 hr tablet Take 1 tablet (25 mg total) by mouth daily.  30 tablet  3  . Multiple Vitamins-Minerals (CENTRUM SILVER PO) 1 tab po qd       . nitroGLYCERIN (NITROSTAT) 0.4 MG SL tablet Place 1 tablet (0.4 mg total) under the tongue every 5 (five) minutes as needed.  25 tablet  3  . PARoxetine (PAXIL) 10 MG tablet Take 10 mg by mouth daily.      . polyethylene glycol powder (GLYCOLAX/MIRALAX) powder Take 17 g by mouth daily.      . ramipril (ALTACE) 2.5 MG capsule TAKE ONE  CAPSULE BY MOUTH TWICE A DAY  60 capsule  11  . rosuvastatin (CRESTOR) 5 MG tablet Take 10 mg by mouth daily.   1 tablet  0    Allergies  Allergen Reactions  . Iohexol      Desc: HIVES- 13 HR PRE-MEDS ARE REQUIRED-ASM- 03/21/05,SULFA,ADHESIVE TAPE   . Latex     Adhesive tape and EKG adhesive leads to rash  . Sulfonamide Derivatives     REACTION: hives    Past Medical History  Diagnosis Date  . Atheroembolism   . Peptic ulcer disease   . GERD (gastroesophageal reflux disease)   . Hyperlipidemia   . Renal artery stenosis   . Hemorrhoids   . Osteopenia   . DM (diabetes mellitus)   . CAD (coronary artery disease)   . Chronic kidney disease, stage III (moderate)   . Hypertension   . Myocardial infarction 03/15/95  . Heart murmur   . Leg pain   . Hiatal hernia   . Arthritis     Cervical DDD C5-6 and C6-7  . Depression   .  Thyroid nodule   . History of shingles   . DDD (degenerative disc disease)   . Blue toe syndrome   . Hemorrhoid     Past Surgical History  Procedure Date  . Cataract extraction   . Tonsillectomy   . Ptca   . Appendectomy   . Hemorrhoid surgery   . Coronary artery bypass graft   . Incisional hernia repair   . Basal cell cancer removal     forehead  . Dilation and curettage of uterus   . Endometrial biopsy   . Inguinal hernia repair   . Squamous cell carcinoma excision     left lower leg    Family History  Problem Relation Age of Onset  . Diabetes Father   . Prostate cancer Father     meta to colon  . Cancer Father   . Parkinsonism Mother   . Stroke Paternal Uncle     History   Social History  . Marital Status: Widowed    Spouse Name: N/A    Number of Children: 1  . Years of Education: N/A   Occupational History  . Retired     [employer]   Social History Main Topics  . Smoking status: Former Smoker -- 1.0 packs/day for 54 years    Types: Cigarettes    Quit date: 10/05/1998  . Smokeless tobacco: Never Used  . Alcohol  Use: No  . Drug Use: No  . Sexually Active: Not on file   Other Topics Concern  . Not on file   Social History Narrative  . No narrative on file    ROS: Please see the HPI.  All other systems reviewed and negative.  PHYSICAL EXAM:  BP 122/70  Pulse 62  Ht 5\' 2"  (1.575 m)  Wt 160 lb 6.4 oz (72.757 kg)  BMI 29.34 kg/m2  SpO2 96%  General: Well developed, well nourished, in no acute distress. Head:  Normocephalic and atraumatic. Neck: no JVD Lungs: Clear to auscultation and percussion. Heart: Normal S1 and S2.  No murmur, rubs or gallops.  Abdomen:  Normal bowel sounds; soft; non tender; no organomegaly Pulses: Pulses normal in all 4 extremities. Neurologic: Alert and oriented x 3.  EKG:  NSR.  Anterior MI, indeterminate age.  Inferior MI, indeterminate age.    ASSESSMENT AND PLAN:

## 2012-01-24 NOTE — Assessment & Plan Note (Addendum)
Most recent LDL was 73, then repeat here was 65.  So at target.

## 2012-01-24 NOTE — Assessment & Plan Note (Signed)
The patient has increasing fatigue. She has an or lose now, and this could be a component. Whether progressive coronary disease is another component is unclear. She would be at high risk for repeat cardiac catheterization given her age. Is also been a question of prior distal embolization of thrombus, although this is been highly uncertain. Finally, we discussed the various ways potential evaluate this. I believe towards doing a noninvasive study to see if there is a dramatic change from previous studies. If there is, then consideration of catheterization would not be excluded. However we want her to have a high risk scan prior to the consideration of repeat catheterization. She will return in followup in about 4 weeks in the interim we will get a nuclear scan.

## 2012-01-29 DIAGNOSIS — M5137 Other intervertebral disc degeneration, lumbosacral region: Secondary | ICD-10-CM | POA: Diagnosis not present

## 2012-01-29 DIAGNOSIS — M5430 Sciatica, unspecified side: Secondary | ICD-10-CM | POA: Diagnosis not present

## 2012-01-29 DIAGNOSIS — M999 Biomechanical lesion, unspecified: Secondary | ICD-10-CM | POA: Diagnosis not present

## 2012-01-29 DIAGNOSIS — IMO0002 Reserved for concepts with insufficient information to code with codable children: Secondary | ICD-10-CM | POA: Diagnosis not present

## 2012-01-30 DIAGNOSIS — M5430 Sciatica, unspecified side: Secondary | ICD-10-CM | POA: Diagnosis not present

## 2012-01-30 DIAGNOSIS — M5137 Other intervertebral disc degeneration, lumbosacral region: Secondary | ICD-10-CM | POA: Diagnosis not present

## 2012-01-30 DIAGNOSIS — IMO0002 Reserved for concepts with insufficient information to code with codable children: Secondary | ICD-10-CM | POA: Diagnosis not present

## 2012-01-30 DIAGNOSIS — M999 Biomechanical lesion, unspecified: Secondary | ICD-10-CM | POA: Diagnosis not present

## 2012-02-03 DIAGNOSIS — N3000 Acute cystitis without hematuria: Secondary | ICD-10-CM | POA: Diagnosis not present

## 2012-02-17 DIAGNOSIS — M999 Biomechanical lesion, unspecified: Secondary | ICD-10-CM | POA: Diagnosis not present

## 2012-02-17 DIAGNOSIS — IMO0002 Reserved for concepts with insufficient information to code with codable children: Secondary | ICD-10-CM | POA: Diagnosis not present

## 2012-02-17 DIAGNOSIS — M5137 Other intervertebral disc degeneration, lumbosacral region: Secondary | ICD-10-CM | POA: Diagnosis not present

## 2012-02-17 DIAGNOSIS — M5430 Sciatica, unspecified side: Secondary | ICD-10-CM | POA: Diagnosis not present

## 2012-02-19 ENCOUNTER — Ambulatory Visit (HOSPITAL_COMMUNITY): Payer: Medicare Other | Attending: Cardiology | Admitting: Radiology

## 2012-02-19 VITALS — Ht 64.0 in | Wt 156.0 lb

## 2012-02-19 DIAGNOSIS — I10 Essential (primary) hypertension: Secondary | ICD-10-CM | POA: Insufficient documentation

## 2012-02-19 DIAGNOSIS — I739 Peripheral vascular disease, unspecified: Secondary | ICD-10-CM | POA: Insufficient documentation

## 2012-02-19 DIAGNOSIS — E119 Type 2 diabetes mellitus without complications: Secondary | ICD-10-CM | POA: Diagnosis not present

## 2012-02-19 DIAGNOSIS — I2510 Atherosclerotic heart disease of native coronary artery without angina pectoris: Secondary | ICD-10-CM

## 2012-02-19 DIAGNOSIS — M25519 Pain in unspecified shoulder: Secondary | ICD-10-CM | POA: Diagnosis not present

## 2012-02-19 DIAGNOSIS — R51 Headache: Secondary | ICD-10-CM

## 2012-02-19 DIAGNOSIS — M25559 Pain in unspecified hip: Secondary | ICD-10-CM

## 2012-02-19 DIAGNOSIS — I6529 Occlusion and stenosis of unspecified carotid artery: Secondary | ICD-10-CM

## 2012-02-19 DIAGNOSIS — R079 Chest pain, unspecified: Secondary | ICD-10-CM | POA: Diagnosis not present

## 2012-02-19 DIAGNOSIS — E785 Hyperlipidemia, unspecified: Secondary | ICD-10-CM

## 2012-02-19 MED ORDER — TECHNETIUM TC 99M SESTAMIBI GENERIC - CARDIOLITE
33.0000 | Freq: Once | INTRAVENOUS | Status: AC | PRN
  Administered 2012-02-19: 33 via INTRAVENOUS

## 2012-02-19 MED ORDER — REGADENOSON 0.4 MG/5ML IV SOLN
0.4000 mg | Freq: Once | INTRAVENOUS | Status: AC
  Administered 2012-02-19: 0.4 mg via INTRAVENOUS

## 2012-02-19 MED ORDER — AMINOPHYLLINE 25 MG/ML IV SOLN
75.0000 mg | Freq: Once | INTRAVENOUS | Status: AC
  Administered 2012-02-19: 75 mg via INTRAVENOUS

## 2012-02-19 MED ORDER — TECHNETIUM TC 99M SESTAMIBI GENERIC - CARDIOLITE
11.0000 | Freq: Once | INTRAVENOUS | Status: AC | PRN
  Administered 2012-02-19: 11 via INTRAVENOUS

## 2012-02-19 NOTE — Progress Notes (Signed)
[facility] [place], [place] [address] [phone]    Cardiology Nuclear Med Study  Stacy [name] is a 79 y.o. female     MRN : [id number]     DOB: [birth year]  Procedure Date: 02/19/2012  Nuclear Med Background Indication for Stress Test:  Evaluation for Ischemia, Stent Patency and PTCA Patency History:  '97 AWMI> PTCA LAD, '00 Stent RCA, '06 Myocardial Perfusion Study-Extensive prior mid to distal anterior and apical infarct, very mild peri-infarct ischemia, EF=59%, '08 Echo: EF=55-60%, and '08 Heart Catheterization-patent PTCA site LAD, patent RCA stent, and mild residual nonobstructive CAD  Cardiac Risk Factors: Carotid Disease, History of Smoking, Hypertension, Lipids, NIDDM and PVD  Symptoms: Nonexertional pain between shoulder blades with last occurrence a couple weeks ago, Fatigue and Fatigue with Exertion   Nuclear Pre-Procedure Caffeine/Decaff Intake:  None NPO After: 9:00pm   Lungs:  clear O2 Sat: 98% on room air. IV 0.9% NS with Angio Cath:  22g  IV Site: R Hand  IV Started by:  Crissie Figures, RN  Chest Size (in):  38 Cup Size: B  Height: 5\' 4"  (1.626 m)  Weight:  156 lb (70.761 kg)  BMI:  Body mass index is 26.78 kg/(m^2). Tech Comments:  Aminophylline 75 mg IVP given post recovery due to persistent headache, and bilateral hip pain with quick resolution of symptoms. Irven Baltimore, RN     Nuclear Med Study 1 or 2 day study: 1 day  Stress Test Type:  Carlton Adam  Reading MD: Mertie Moores, MD  Order Authorizing Provider:  Bing Quarry, MD  Resting Radionuclide: Technetium 74m Sestamibi  Resting Radionuclide Dose: 11.0 mCi   Stress Radionuclide:  Technetium 5m Sestamibi  Stress Radionuclide Dose: 33.0 mCi           Stress Protocol Rest HR: 58 Stress HR: 94  Rest BP: 144/67 Stress BP: 144/72  Exercise Time (min): n/a METS: n/a   Predicted Max HR: 137 bpm % Max HR: 68.61 bpm Rate Pressure Product: 13536    Dose of  Adenosine (mg):  n/a Dose of Lexiscan: 0.4 mg  Dose of Atropine (mg): n/a Dose of Dobutamine: n/a mcg/kg/min (at max HR)  Stress Test Technologist: Irven Baltimore, RN  Nuclear Technologist:  Charlton Amor, CNMT     Rest Procedure:  Myocardial perfusion imaging was performed at rest 45 minutes following the intravenous administration of Technetium 19m Sestamibi. Rest ECG: NSR, Q waves anteriorly, inferiorly, and laterally  Stress Procedure:  The patient received IV Lexiscan 0.4 mg over 15-seconds.  Technetium 59m Sestamibi injected at 30-seconds. The patient complained of chest tightness, headache, and bilateral hip pain.  Quantitative spect images were obtained after a 45 minute delay. Stress ECG: No significant change from baseline ECG  QPS Raw Data Images:  Normal; no motion artifact; normal heart/lung ratio. Stress Images:  There is a large severe defect in the mid-distal anterior wall and apex.  The uptake in the other regions is fairly normal.  Rest Images:  There is a large severe defect in the mid-distal anterior wall and apex.  The uptake in the other regions is fairly normal. Subtraction (SDS):  No evidence of ischemia.  There is a large anterior apical scar consistent with a previous MI.  Transient Ischemic Dilatation (Normal <1.22):  1.27 Lung/Heart Ratio (Normal <0.45):  0.44  Quantitative Gated Spect Images QGS EDV:  80 ml QGS ESV:  28 ml  Impression Exercise Capacity:  Lexiscan with no  exercise. BP Response:  Normal blood pressure response. Clinical Symptoms:  No significant symptoms noted. ECG Impression:  No significant ST segment change suggestive of ischemia. Comparison with Prior Nuclear Study: No images to compare  Overall Impression:  Low risk stress nuclear study.  She has had a previous anterior apical MI.  This is not a new finding.   There are no areas of ischemia.  LV Ejection Fraction: 65% by computer calculation.  I suspect the actual EF is lower.  LV Wall  Motion:  There is akinesis of the distal anterior wall and apex.     Thayer Headings, Brooke Bonito., MD, [facility] 02/19/2012, 5:32 PM Office - [phone] Pager [phone]

## 2012-02-21 ENCOUNTER — Ambulatory Visit (INDEPENDENT_AMBULATORY_CARE_PROVIDER_SITE_OTHER): Payer: Medicare Other | Admitting: Cardiology

## 2012-02-21 VITALS — BP 130/68 | HR 64 | Ht 64.0 in | Wt 152.0 lb

## 2012-02-21 DIAGNOSIS — I6529 Occlusion and stenosis of unspecified carotid artery: Secondary | ICD-10-CM | POA: Diagnosis not present

## 2012-02-21 DIAGNOSIS — E785 Hyperlipidemia, unspecified: Secondary | ICD-10-CM | POA: Diagnosis not present

## 2012-02-21 DIAGNOSIS — I10 Essential (primary) hypertension: Secondary | ICD-10-CM

## 2012-02-21 DIAGNOSIS — I2510 Atherosclerotic heart disease of native coronary artery without angina pectoris: Secondary | ICD-10-CM | POA: Diagnosis not present

## 2012-02-21 NOTE — Patient Instructions (Addendum)
Continue taking your medications as directed.  Follow-up with Dr. Lia Foyer in March

## 2012-02-22 NOTE — Progress Notes (Signed)
HPI:  Since her last visit the patient has not been a real change in status. We reviewed in some detail her nuclear imaging study, and this demonstrates persistent scar in the anterior segment. She does not have much in the way of chest discomfort, and she wonders even if the scan has a therapeutic effect as she feels much better in the last couple of days. I told him that this was not likely. The main problem had been energy level, and she feels somewhat more energetic. We discussed metoprolol dosing in some detail today, and I gave she and her daughter some [place] way with regard to reducing the dose of this is a small trial to reassess how she feels. At the present time, we agreed that she would not make major changes, but that if she felt fatigued she might consider reducing the dose as holding her medications occurred prior to the test. She's not had ongoing or progressive chest discomfort.  Current Outpatient Prescriptions  Medication Sig Dispense Refill  . aspirin 325 MG tablet Take 325 mg by mouth daily.        . cholecalciferol (VITAMIN D) 400 UNITS TABS Take 400 Units by mouth daily.       . clopidogrel (PLAVIX) 75 MG tablet Take 1 tablet (75 mg total) by mouth daily.  30 tablet  12  . Coenzyme Q10 (COQ-10 PO) Take 100 mg by mouth daily. 1 tab po qd      . conjugated estrogens (PREMARIN) vaginal cream 0.5 g once a week. .625 mg once weekly      . CRESTOR 10 MG tablet TAKE ONE TABLET DAILY  30 tablet  11  . cyanocobalamin 500 MCG tablet Take 500 mcg by mouth 2 (two) times daily.        Marland Kitchen dexlansoprazole (DEXILANT) 60 MG capsule Take 1 tablet ( Dexilant DR ) by mouth once daily      . hydrochlorothiazide (HYDRODIURIL) 25 MG tablet TAKE 1/2 TABLET EVERY DAY  15 tablet  6  . ibuprofen (ADVIL) 200 MG tablet Take 200 mg by mouth as needed.      Marland Kitchen ketoconazole (NIZORAL) 2 % cream Apply topically daily as needed.      . metoprolol succinate (TOPROL-XL) 25 MG 24 hr tablet Take 1 tablet (25 mg total)  by mouth daily.  30 tablet  3  . Multiple Vitamins-Minerals (CENTRUM SILVER PO) 1 tab po qd       . nitroGLYCERIN (NITROSTAT) 0.4 MG SL tablet Place 1 tablet (0.4 mg total) under the tongue every 5 (five) minutes as needed.  25 tablet  3  . PARoxetine (PAXIL) 10 MG tablet Take 10 mg by mouth daily.      . polyethylene glycol powder (GLYCOLAX/MIRALAX) powder Take 17 g by mouth daily.      . Probiotic Product ([employer] SENIOR PROBIOTIC) CAPS Take 1 capsule daily      . ramipril (ALTACE) 2.5 MG capsule TAKE ONE CAPSULE BY MOUTH TWICE A DAY  60 capsule  11    Allergies  Allergen Reactions  . Iohexol      Desc: HIVES- 13 HR PRE-MEDS ARE REQUIRED-ASM- 03/21/05,SULFA,ADHESIVE TAPE   . Latex     Adhesive tape and EKG adhesive leads to rash  . Sulfonamide Derivatives     REACTION: hives    Past Medical History  Diagnosis Date  . Atheroembolism   . Peptic ulcer disease   . GERD (gastroesophageal reflux disease)   . Hyperlipidemia   .  Renal artery stenosis   . Hemorrhoids   . Osteopenia   . DM (diabetes mellitus)   . CAD (coronary artery disease)   . Chronic kidney disease, stage III (moderate)   . Hypertension   . Myocardial infarction 03/15/95  . Heart murmur   . Leg pain   . Hiatal hernia   . Arthritis     Cervical DDD C5-6 and C6-7  . Depression   . Thyroid nodule   . History of shingles   . DDD (degenerative disc disease)   . Blue toe syndrome   . Hemorrhoid     Past Surgical History  Procedure Date  . Cataract extraction   . Tonsillectomy   . Ptca   . Appendectomy   . Hemorrhoid surgery   . Coronary artery bypass graft   . Incisional hernia repair   . Basal cell cancer removal     forehead  . Dilation and curettage of uterus   . Endometrial biopsy   . Inguinal hernia repair   . Squamous cell carcinoma excision     left lower leg    Family History  Problem Relation Age of Onset  . Diabetes Father   . Prostate cancer Father     meta to colon  . Cancer Father     . Parkinsonism Mother   . Stroke Paternal Uncle     History   Social History  . Marital Status: Widowed    Spouse Name: N/A    Number of Children: 1  . Years of Education: N/A   Occupational History  . Retired     [employer]   Social History Main Topics  . Smoking status: Former Smoker -- 1.0 packs/day for 54 years    Types: Cigarettes    Quit date: 10/05/1998  . Smokeless tobacco: Never Used  . Alcohol Use: No  . Drug Use: No  . Sexually Active: Not on file   Other Topics Concern  . Not on file   Social History Narrative  . No narrative on file    ROS: Please see the HPI.  All other systems reviewed and negative.  PHYSICAL EXAM:  BP 130/68  Pulse 64  Ht 5\' 4"  (1.626 m)  Wt 152 lb (68.947 kg)  BMI 26.09 kg/m2  SpO2 97%  General: Delightful women in no distress.   Head:  Normocephalic and atraumatic. Neck: no JVD.  Right carotid bruit, not prominent.  Lungs: Clear to auscultation and percussion. Heart: Normal S1 and S2.  Minimal SEM.  No DM.   Pulses: Pulses normal in all 4 extremities. Extremities: No clubbing or cyanosis. No edema.  No evidence of thromboembolic changes  (see prior history) Neurologic: Alert and oriented x 3.  EKG: Not done.  Recent Nuclear scan  NUCLEAR Raw Data Images: Normal; no motion artifact; normal heart/lung ratio.  Stress Images: There is a large severe defect in the mid-distal anterior wall and apex. The uptake in the other regions is fairly normal.  Rest Images: There is a large severe defect in the mid-distal anterior wall and apex. The uptake in the other regions is fairly normal.  Subtraction (SDS): No evidence of ischemia. There is a large anterior apical scar consistent with a previous MI.  Transient Ischemic Dilatation (Normal <1.22): 1.27  Lung/Heart Ratio (Normal <0.45): 0.44  Quantitative Gated Spect Images  QGS EDV: 80 ml  QGS ESV: 28 ml  Impression  Exercise Capacity: Lexiscan with no exercise.  BP  Response: Normal  blood pressure response.  Clinical Symptoms: No significant symptoms noted.  ECG Impression: No significant ST segment change suggestive of ischemia.  Comparison with Prior Nuclear Study: No images to compare  Overall Impression: Low risk stress nuclear study. She has had a previous anterior apical MI. This is not a new finding. There are no areas of ischemia.  LV Ejection Fraction: 65% by computer calculation. I suspect the actual EF is lower. LV Wall Motion: There is akinesis of the distal anterior wall and apex.       ASSESSMENT AND PLAN:

## 2012-02-26 DIAGNOSIS — M5430 Sciatica, unspecified side: Secondary | ICD-10-CM | POA: Diagnosis not present

## 2012-02-26 DIAGNOSIS — M999 Biomechanical lesion, unspecified: Secondary | ICD-10-CM | POA: Diagnosis not present

## 2012-02-26 DIAGNOSIS — M5137 Other intervertebral disc degeneration, lumbosacral region: Secondary | ICD-10-CM | POA: Diagnosis not present

## 2012-02-26 DIAGNOSIS — IMO0002 Reserved for concepts with insufficient information to code with codable children: Secondary | ICD-10-CM | POA: Diagnosis not present

## 2012-03-02 DIAGNOSIS — M5137 Other intervertebral disc degeneration, lumbosacral region: Secondary | ICD-10-CM | POA: Diagnosis not present

## 2012-03-02 DIAGNOSIS — M999 Biomechanical lesion, unspecified: Secondary | ICD-10-CM | POA: Diagnosis not present

## 2012-03-02 DIAGNOSIS — IMO0002 Reserved for concepts with insufficient information to code with codable children: Secondary | ICD-10-CM | POA: Diagnosis not present

## 2012-03-02 DIAGNOSIS — M5430 Sciatica, unspecified side: Secondary | ICD-10-CM | POA: Diagnosis not present

## 2012-03-09 DIAGNOSIS — M5430 Sciatica, unspecified side: Secondary | ICD-10-CM | POA: Diagnosis not present

## 2012-03-09 DIAGNOSIS — M5137 Other intervertebral disc degeneration, lumbosacral region: Secondary | ICD-10-CM | POA: Diagnosis not present

## 2012-03-09 DIAGNOSIS — M999 Biomechanical lesion, unspecified: Secondary | ICD-10-CM | POA: Diagnosis not present

## 2012-03-09 DIAGNOSIS — IMO0002 Reserved for concepts with insufficient information to code with codable children: Secondary | ICD-10-CM | POA: Diagnosis not present

## 2012-03-09 NOTE — Assessment & Plan Note (Signed)
Controlled at present.  

## 2012-03-09 NOTE — Assessment & Plan Note (Signed)
See scan.  Followed at [facility].  60-79% on the Right.  Asymptomatic.

## 2012-03-09 NOTE — Assessment & Plan Note (Signed)
On good medical therapy.  Controlled with target LDL, but with new guidelines on adequate dose, especially given age.

## 2012-03-09 NOTE — Assessment & Plan Note (Addendum)
She has modest fatigue, but is better the past few days.  She has moderate risk of recath, and her scan does not suggest progressive ischemia.  I reviewed this with Dr. Acie Fredrickson in detail.  Would lean toward medical therapy at the present time.  No change in status.  Will continue to recommend follow up regularly.   I have given her some leeway with regard to changing metoprolol dose--basically eliminating if she remains fatigued.

## 2012-03-12 DIAGNOSIS — M5430 Sciatica, unspecified side: Secondary | ICD-10-CM | POA: Diagnosis not present

## 2012-03-12 DIAGNOSIS — M5137 Other intervertebral disc degeneration, lumbosacral region: Secondary | ICD-10-CM | POA: Diagnosis not present

## 2012-03-12 DIAGNOSIS — IMO0002 Reserved for concepts with insufficient information to code with codable children: Secondary | ICD-10-CM | POA: Diagnosis not present

## 2012-03-12 DIAGNOSIS — M999 Biomechanical lesion, unspecified: Secondary | ICD-10-CM | POA: Diagnosis not present

## 2012-03-16 DIAGNOSIS — M5137 Other intervertebral disc degeneration, lumbosacral region: Secondary | ICD-10-CM | POA: Diagnosis not present

## 2012-03-16 DIAGNOSIS — IMO0002 Reserved for concepts with insufficient information to code with codable children: Secondary | ICD-10-CM | POA: Diagnosis not present

## 2012-03-16 DIAGNOSIS — M999 Biomechanical lesion, unspecified: Secondary | ICD-10-CM | POA: Diagnosis not present

## 2012-03-16 DIAGNOSIS — M5430 Sciatica, unspecified side: Secondary | ICD-10-CM | POA: Diagnosis not present

## 2012-03-17 DIAGNOSIS — E042 Nontoxic multinodular goiter: Secondary | ICD-10-CM | POA: Diagnosis not present

## 2012-03-17 DIAGNOSIS — E119 Type 2 diabetes mellitus without complications: Secondary | ICD-10-CM | POA: Diagnosis not present

## 2012-03-18 DIAGNOSIS — IMO0002 Reserved for concepts with insufficient information to code with codable children: Secondary | ICD-10-CM | POA: Diagnosis not present

## 2012-03-18 DIAGNOSIS — M999 Biomechanical lesion, unspecified: Secondary | ICD-10-CM | POA: Diagnosis not present

## 2012-03-18 DIAGNOSIS — M5430 Sciatica, unspecified side: Secondary | ICD-10-CM | POA: Diagnosis not present

## 2012-03-18 DIAGNOSIS — M5137 Other intervertebral disc degeneration, lumbosacral region: Secondary | ICD-10-CM | POA: Diagnosis not present

## 2012-03-23 DIAGNOSIS — IMO0002 Reserved for concepts with insufficient information to code with codable children: Secondary | ICD-10-CM | POA: Diagnosis not present

## 2012-03-23 DIAGNOSIS — M5137 Other intervertebral disc degeneration, lumbosacral region: Secondary | ICD-10-CM | POA: Diagnosis not present

## 2012-03-23 DIAGNOSIS — M5430 Sciatica, unspecified side: Secondary | ICD-10-CM | POA: Diagnosis not present

## 2012-03-23 DIAGNOSIS — M999 Biomechanical lesion, unspecified: Secondary | ICD-10-CM | POA: Diagnosis not present

## 2012-03-24 ENCOUNTER — Other Ambulatory Visit: Payer: Self-pay | Admitting: *Deleted

## 2012-03-24 DIAGNOSIS — I10 Essential (primary) hypertension: Secondary | ICD-10-CM

## 2012-03-24 DIAGNOSIS — I701 Atherosclerosis of renal artery: Secondary | ICD-10-CM

## 2012-03-24 DIAGNOSIS — E785 Hyperlipidemia, unspecified: Secondary | ICD-10-CM

## 2012-03-24 DIAGNOSIS — I2510 Atherosclerotic heart disease of native coronary artery without angina pectoris: Secondary | ICD-10-CM

## 2012-03-24 MED ORDER — METOPROLOL SUCCINATE ER 25 MG PO TB24: 25.0000 mg | ORAL_TABLET | Freq: Every day | ORAL | Status: DC

## 2012-03-30 DIAGNOSIS — E119 Type 2 diabetes mellitus without complications: Secondary | ICD-10-CM | POA: Diagnosis not present

## 2012-03-30 DIAGNOSIS — M5137 Other intervertebral disc degeneration, lumbosacral region: Secondary | ICD-10-CM | POA: Diagnosis not present

## 2012-03-30 DIAGNOSIS — M999 Biomechanical lesion, unspecified: Secondary | ICD-10-CM | POA: Diagnosis not present

## 2012-03-30 DIAGNOSIS — IMO0002 Reserved for concepts with insufficient information to code with codable children: Secondary | ICD-10-CM | POA: Diagnosis not present

## 2012-03-30 DIAGNOSIS — M5430 Sciatica, unspecified side: Secondary | ICD-10-CM | POA: Diagnosis not present

## 2012-04-06 DIAGNOSIS — M5430 Sciatica, unspecified side: Secondary | ICD-10-CM | POA: Diagnosis not present

## 2012-04-06 DIAGNOSIS — IMO0002 Reserved for concepts with insufficient information to code with codable children: Secondary | ICD-10-CM | POA: Diagnosis not present

## 2012-04-06 DIAGNOSIS — M999 Biomechanical lesion, unspecified: Secondary | ICD-10-CM | POA: Diagnosis not present

## 2012-04-06 DIAGNOSIS — M5137 Other intervertebral disc degeneration, lumbosacral region: Secondary | ICD-10-CM | POA: Diagnosis not present

## 2012-04-15 DIAGNOSIS — E119 Type 2 diabetes mellitus without complications: Secondary | ICD-10-CM | POA: Diagnosis not present

## 2012-04-15 DIAGNOSIS — I10 Essential (primary) hypertension: Secondary | ICD-10-CM | POA: Diagnosis not present

## 2012-04-15 DIAGNOSIS — M999 Biomechanical lesion, unspecified: Secondary | ICD-10-CM | POA: Diagnosis not present

## 2012-04-15 DIAGNOSIS — E785 Hyperlipidemia, unspecified: Secondary | ICD-10-CM | POA: Diagnosis not present

## 2012-04-15 DIAGNOSIS — M5137 Other intervertebral disc degeneration, lumbosacral region: Secondary | ICD-10-CM | POA: Diagnosis not present

## 2012-04-16 DIAGNOSIS — E785 Hyperlipidemia, unspecified: Secondary | ICD-10-CM | POA: Diagnosis not present

## 2012-04-16 DIAGNOSIS — I10 Essential (primary) hypertension: Secondary | ICD-10-CM | POA: Diagnosis not present

## 2012-04-16 DIAGNOSIS — E119 Type 2 diabetes mellitus without complications: Secondary | ICD-10-CM | POA: Diagnosis not present

## 2012-04-22 DIAGNOSIS — R5381 Other malaise: Secondary | ICD-10-CM | POA: Diagnosis not present

## 2012-04-22 DIAGNOSIS — F341 Dysthymic disorder: Secondary | ICD-10-CM | POA: Diagnosis not present

## 2012-04-22 DIAGNOSIS — R5383 Other fatigue: Secondary | ICD-10-CM | POA: Diagnosis not present

## 2012-04-22 DIAGNOSIS — M999 Biomechanical lesion, unspecified: Secondary | ICD-10-CM | POA: Diagnosis not present

## 2012-04-23 ENCOUNTER — Ambulatory Visit (INDEPENDENT_AMBULATORY_CARE_PROVIDER_SITE_OTHER): Payer: Medicare Other | Admitting: Cardiology

## 2012-04-23 ENCOUNTER — Encounter: Payer: Self-pay | Admitting: Cardiology

## 2012-04-23 VITALS — BP 108/62 | HR 64 | Ht 64.0 in | Wt 155.0 lb

## 2012-04-23 DIAGNOSIS — I2510 Atherosclerotic heart disease of native coronary artery without angina pectoris: Secondary | ICD-10-CM | POA: Diagnosis not present

## 2012-04-23 DIAGNOSIS — I10 Essential (primary) hypertension: Secondary | ICD-10-CM | POA: Diagnosis not present

## 2012-04-23 DIAGNOSIS — G4700 Insomnia, unspecified: Secondary | ICD-10-CM | POA: Diagnosis not present

## 2012-04-23 DIAGNOSIS — I6529 Occlusion and stenosis of unspecified carotid artery: Secondary | ICD-10-CM | POA: Diagnosis not present

## 2012-04-23 NOTE — Patient Instructions (Addendum)
Your physician has recommended you make the following change in your medication: DECREASE Aspirin to 81mg  take one by mouth daily  Your physician wants you to follow-up in: 6 MONTHS with Dr Burt Knack (previous pt of Dr Lia Foyer). You will receive a reminder letter in the mail two months in advance. If you don't receive a letter, please call our office to schedule the follow-up appointment.

## 2012-04-23 NOTE — Progress Notes (Signed)
HPI:  This very nice patient is in for followup. She's never had a TIA. She is not sleeping well at night, and Dr. Henrene Pastor is been making some changes in her medications. Her major complaint is that of chronic exhaustion, but again she is not sleeping at night at all. She denies any chest pain. She was having some shoulder blade discomfort, but has been seeing a chiropractor and that is improved at this point.  Current Outpatient Prescriptions  Medication Sig Dispense Refill  . aspirin 325 MG tablet Take 325 mg by mouth daily.        . cholecalciferol (VITAMIN D) 400 UNITS TABS Take 400 Units by mouth daily.       . clopidogrel (PLAVIX) 75 MG tablet Take 1 tablet (75 mg total) by mouth daily.  30 tablet  12  . Coenzyme Q10 (COQ-10 PO) Take 100 mg by mouth daily. 1 tab po qd      . conjugated estrogens (PREMARIN) vaginal cream 0.5 g once a week. .625 mg once weekly      . CRESTOR 10 MG tablet TAKE ONE TABLET DAILY  30 tablet  11  . cyanocobalamin 500 MCG tablet Take 500 mcg by mouth 2 (two) times daily.        Marland Kitchen dexlansoprazole (DEXILANT) 60 MG capsule Take 1 tablet ( Dexilant DR ) by mouth once daily      . hydrochlorothiazide (HYDRODIURIL) 25 MG tablet TAKE 1/2 TABLET EVERY DAY  15 tablet  6  . ibuprofen (ADVIL) 200 MG tablet Take 200 mg by mouth as needed.      Marland Kitchen ketoconazole (NIZORAL) 2 % cream Apply topically daily as needed.      . metoprolol succinate (TOPROL-XL) 25 MG 24 hr tablet Take 1 tablet (25 mg total) by mouth daily.  30 tablet  6  . Multiple Vitamins-Minerals (CENTRUM SILVER PO) 1 tab po qd       . nitroGLYCERIN (NITROSTAT) 0.4 MG SL tablet Place 1 tablet (0.4 mg total) under the tongue every 5 (five) minutes as needed.  25 tablet  3  . polyethylene glycol powder (GLYCOLAX/MIRALAX) powder Take 17 g by mouth daily.      . ramipril (ALTACE) 2.5 MG capsule TAKE ONE CAPSULE BY MOUTH TWICE A DAY  60 capsule  11  . sertraline (ZOLOFT) 25 MG tablet Take 25 mg by mouth daily.       .  traZODone (DESYREL) 50 MG tablet Take 50 mg by mouth as needed.        No current facility-administered medications for this visit.    Allergies  Allergen Reactions  . Iohexol      Desc: HIVES- 13 HR PRE-MEDS ARE REQUIRED-ASM- 03/21/05,SULFA,ADHESIVE TAPE   . Latex     Adhesive tape and EKG adhesive leads to rash  . Sulfonamide Derivatives     REACTION: hives    Past Medical History  Diagnosis Date  . Atheroembolism   . Peptic ulcer disease   . GERD (gastroesophageal reflux disease)   . Hyperlipidemia   . Renal artery stenosis   . Hemorrhoids   . Osteopenia   . DM (diabetes mellitus)   . CAD (coronary artery disease)   . Chronic kidney disease, stage III (moderate)   . Hypertension   . Myocardial infarction 03/15/95  . Heart murmur   . Leg pain   . Hiatal hernia   . Arthritis     Cervical DDD C5-6 and C6-7  . Depression   .  Thyroid nodule   . History of shingles   . DDD (degenerative disc disease)   . Blue toe syndrome   . Hemorrhoid     Past Surgical History  Procedure Laterality Date  . Cataract extraction    . Tonsillectomy    . Ptca    . Appendectomy    . Hemorrhoid surgery    . Coronary artery bypass graft    . Incisional hernia repair    . Basal cell cancer removal      forehead  . Dilation and curettage of uterus    . Endometrial biopsy    . Inguinal hernia repair    . Squamous cell carcinoma excision      left lower leg    Family History  Problem Relation Age of Onset  . Diabetes Father   . Prostate cancer Father     meta to colon  . Cancer Father   . Parkinsonism Mother   . Stroke Paternal Uncle     History   Social History  . Marital Status: Widowed    Spouse Name: N/A    Number of Children: 1  . Years of Education: N/A   Occupational History  . Retired     [employer]   Social History Main Topics  . Smoking status: Former Smoker -- 1.00 packs/day for 54 years    Types: Cigarettes    Quit date: 10/05/1998  . Smokeless  tobacco: Never Used  . Alcohol Use: No  . Drug Use: No  . Sexually Active: Not on file   Other Topics Concern  . Not on file   Social History Narrative  . No narrative on file    ROS: Please see the HPI.  All other systems reviewed and negative.  PHYSICAL EXAM:  BP 108/62  Pulse 64  Ht 5\' 4"  (1.626 m)  Wt 155 lb (70.308 kg)  BMI 26.59 kg/m2  SpO2 95%  General: Well developed, well nourished, in no acute distress. Head:  Normocephalic and atraumatic. Neck: no JVD.  R carotid bruit.   Lungs: Clear to auscultation and percussion. Heart: Normal S1 and S2.  No murmur, rubs or gallops.  Abdomen:  Normal bowel sounds; soft; non tender; no organomegaly Pulses: Pulses normal in all 4 extremities. Extremities: No clubbing or cyanosis. No edema. Neurologic: Alert and oriented x 3.  EKG:  NSR.  Anterior MI, age indeterminate.  Left axis deviation.  Low voltage qrs.  Small inferior Qs.    ASSESSMENT AND PLAN:  1.  Decrease ASA to 81 mg 2.  RTC 6 months  --- Burt Knack.  3.

## 2012-04-25 NOTE — Assessment & Plan Note (Signed)
Stable BP on low dose meds.

## 2012-04-25 NOTE — Assessment & Plan Note (Signed)
:  LDL is at target.  LFTs are satisfactory.

## 2012-04-25 NOTE — Assessment & Plan Note (Signed)
Followed at [facility].

## 2012-04-25 NOTE — Assessment & Plan Note (Signed)
Significant issue for the patient.  Remains under the care of her primary care MD.

## 2012-04-25 NOTE — Assessment & Plan Note (Addendum)
She has been stable.  Her shoulder pain is improved.  I would take a conservative approach toward recatheterization, but it could be done if necessary.  Seems unlikely that her lack of sleep is related to her CAD.  See my overview of events. I think she can decrease her ASA to 81mg . Data reviewed.

## 2012-04-29 DIAGNOSIS — M5430 Sciatica, unspecified side: Secondary | ICD-10-CM | POA: Diagnosis not present

## 2012-04-29 DIAGNOSIS — IMO0002 Reserved for concepts with insufficient information to code with codable children: Secondary | ICD-10-CM | POA: Diagnosis not present

## 2012-04-29 DIAGNOSIS — M999 Biomechanical lesion, unspecified: Secondary | ICD-10-CM | POA: Diagnosis not present

## 2012-05-06 ENCOUNTER — Ambulatory Visit (INDEPENDENT_AMBULATORY_CARE_PROVIDER_SITE_OTHER): Payer: Medicare Other | Admitting: Obstetrics & Gynecology

## 2012-05-06 ENCOUNTER — Encounter: Payer: Self-pay | Admitting: Obstetrics & Gynecology

## 2012-05-06 VITALS — BP 113/79 | HR 61 | Temp 97.9°F | Ht 62.25 in | Wt 152.0 lb

## 2012-05-06 DIAGNOSIS — M999 Biomechanical lesion, unspecified: Secondary | ICD-10-CM | POA: Diagnosis not present

## 2012-05-06 DIAGNOSIS — IMO0002 Reserved for concepts with insufficient information to code with codable children: Secondary | ICD-10-CM | POA: Diagnosis not present

## 2012-05-06 DIAGNOSIS — Z01419 Encounter for gynecological examination (general) (routine) without abnormal findings: Secondary | ICD-10-CM

## 2012-05-06 DIAGNOSIS — Z124 Encounter for screening for malignant neoplasm of cervix: Secondary | ICD-10-CM | POA: Diagnosis not present

## 2012-05-06 DIAGNOSIS — M5137 Other intervertebral disc degeneration, lumbosacral region: Secondary | ICD-10-CM | POA: Diagnosis not present

## 2012-05-06 DIAGNOSIS — M5430 Sciatica, unspecified side: Secondary | ICD-10-CM | POA: Diagnosis not present

## 2012-05-06 NOTE — Progress Notes (Signed)
Subjective:     Stacy [name] is a 79 y.o. female here for a routine exam.  Current complaints: no concerns.Personal health questionnaire reviewed: no.   Gynecologic History No LMP recorded. Patient is postmenopausal. Contraception: abstinence Last Pap: 03/2011. Results were: normal Last mammogram: 04/2011 Results were: normal Bone Density Scan: 2013: Osteopenia    The following portions of the patient's history were reviewed and updated as appropriate: allergies, current medications, past family history, past medical history, past social history and past surgical history.  Review of Systems Pertinent items are noted in HPI.    Objective:    General appearance: alert Abdomen: soft, non-tender; bowel sounds normal; no masses,  no organomegaly Pelvic: cervix normal in appearance, no adnexal masses or tenderness, rectovaginal septum normal, uterus normal size, shape, and consistency, vagina normal without discharge and urethral caruncle, atrophic changes   Breast: No masses, no discharge Assessment:    Healthy female exam.    Plan:    Follow up in: 1 year. Bone density in 3 - 5 yrs from study 3/13

## 2012-05-08 LAB — PAP IG W/ RFLX HPV ASCU

## 2012-05-11 DIAGNOSIS — F341 Dysthymic disorder: Secondary | ICD-10-CM | POA: Diagnosis not present

## 2012-05-13 DIAGNOSIS — IMO0002 Reserved for concepts with insufficient information to code with codable children: Secondary | ICD-10-CM | POA: Diagnosis not present

## 2012-05-13 DIAGNOSIS — M5137 Other intervertebral disc degeneration, lumbosacral region: Secondary | ICD-10-CM | POA: Diagnosis not present

## 2012-05-13 DIAGNOSIS — M5430 Sciatica, unspecified side: Secondary | ICD-10-CM | POA: Diagnosis not present

## 2012-05-13 DIAGNOSIS — M999 Biomechanical lesion, unspecified: Secondary | ICD-10-CM | POA: Diagnosis not present

## 2012-05-20 DIAGNOSIS — M5430 Sciatica, unspecified side: Secondary | ICD-10-CM | POA: Diagnosis not present

## 2012-05-20 DIAGNOSIS — M5137 Other intervertebral disc degeneration, lumbosacral region: Secondary | ICD-10-CM | POA: Diagnosis not present

## 2012-05-20 DIAGNOSIS — IMO0002 Reserved for concepts with insufficient information to code with codable children: Secondary | ICD-10-CM | POA: Diagnosis not present

## 2012-05-20 DIAGNOSIS — M999 Biomechanical lesion, unspecified: Secondary | ICD-10-CM | POA: Diagnosis not present

## 2012-05-26 ENCOUNTER — Other Ambulatory Visit: Payer: Self-pay | Admitting: *Deleted

## 2012-06-02 DIAGNOSIS — M5430 Sciatica, unspecified side: Secondary | ICD-10-CM | POA: Diagnosis not present

## 2012-06-02 DIAGNOSIS — IMO0002 Reserved for concepts with insufficient information to code with codable children: Secondary | ICD-10-CM | POA: Diagnosis not present

## 2012-06-02 DIAGNOSIS — M5137 Other intervertebral disc degeneration, lumbosacral region: Secondary | ICD-10-CM | POA: Diagnosis not present

## 2012-06-02 DIAGNOSIS — M999 Biomechanical lesion, unspecified: Secondary | ICD-10-CM | POA: Diagnosis not present

## 2012-06-08 ENCOUNTER — Ambulatory Visit: Payer: Self-pay | Admitting: Neurosurgery

## 2012-06-08 ENCOUNTER — Other Ambulatory Visit (INDEPENDENT_AMBULATORY_CARE_PROVIDER_SITE_OTHER): Payer: Medicare Other | Admitting: *Deleted

## 2012-06-08 DIAGNOSIS — I6529 Occlusion and stenosis of unspecified carotid artery: Secondary | ICD-10-CM | POA: Diagnosis not present

## 2012-06-15 ENCOUNTER — Other Ambulatory Visit: Payer: Self-pay

## 2012-06-15 ENCOUNTER — Ambulatory Visit: Payer: Self-pay | Admitting: Neurosurgery

## 2012-06-17 DIAGNOSIS — M5137 Other intervertebral disc degeneration, lumbosacral region: Secondary | ICD-10-CM | POA: Diagnosis not present

## 2012-06-17 DIAGNOSIS — M5430 Sciatica, unspecified side: Secondary | ICD-10-CM | POA: Diagnosis not present

## 2012-06-17 DIAGNOSIS — M999 Biomechanical lesion, unspecified: Secondary | ICD-10-CM | POA: Diagnosis not present

## 2012-06-17 DIAGNOSIS — IMO0002 Reserved for concepts with insufficient information to code with codable children: Secondary | ICD-10-CM | POA: Diagnosis not present

## 2012-06-30 DIAGNOSIS — M5137 Other intervertebral disc degeneration, lumbosacral region: Secondary | ICD-10-CM | POA: Diagnosis not present

## 2012-06-30 DIAGNOSIS — M5430 Sciatica, unspecified side: Secondary | ICD-10-CM | POA: Diagnosis not present

## 2012-06-30 DIAGNOSIS — IMO0002 Reserved for concepts with insufficient information to code with codable children: Secondary | ICD-10-CM | POA: Diagnosis not present

## 2012-06-30 DIAGNOSIS — M999 Biomechanical lesion, unspecified: Secondary | ICD-10-CM | POA: Diagnosis not present

## 2012-07-13 DIAGNOSIS — M5430 Sciatica, unspecified side: Secondary | ICD-10-CM | POA: Diagnosis not present

## 2012-07-13 DIAGNOSIS — M5137 Other intervertebral disc degeneration, lumbosacral region: Secondary | ICD-10-CM | POA: Diagnosis not present

## 2012-07-13 DIAGNOSIS — M999 Biomechanical lesion, unspecified: Secondary | ICD-10-CM | POA: Diagnosis not present

## 2012-07-13 DIAGNOSIS — IMO0002 Reserved for concepts with insufficient information to code with codable children: Secondary | ICD-10-CM | POA: Diagnosis not present

## 2012-07-22 ENCOUNTER — Other Ambulatory Visit: Payer: Self-pay

## 2012-07-22 MED ORDER — HYDROCHLOROTHIAZIDE 25 MG PO TABS: ORAL_TABLET | ORAL | Status: DC

## 2012-07-29 ENCOUNTER — Other Ambulatory Visit: Payer: Self-pay

## 2012-07-29 DIAGNOSIS — M5137 Other intervertebral disc degeneration, lumbosacral region: Secondary | ICD-10-CM | POA: Diagnosis not present

## 2012-07-29 DIAGNOSIS — IMO0002 Reserved for concepts with insufficient information to code with codable children: Secondary | ICD-10-CM | POA: Diagnosis not present

## 2012-07-29 DIAGNOSIS — M999 Biomechanical lesion, unspecified: Secondary | ICD-10-CM | POA: Diagnosis not present

## 2012-07-29 DIAGNOSIS — Z1231 Encounter for screening mammogram for malignant neoplasm of breast: Secondary | ICD-10-CM

## 2012-07-29 DIAGNOSIS — M5430 Sciatica, unspecified side: Secondary | ICD-10-CM | POA: Diagnosis not present

## 2012-07-31 ENCOUNTER — Encounter: Payer: Self-pay | Admitting: Surgery

## 2012-08-03 ENCOUNTER — Encounter: Payer: Self-pay | Admitting: Surgery

## 2012-08-03 ENCOUNTER — Ambulatory Visit (INDEPENDENT_AMBULATORY_CARE_PROVIDER_SITE_OTHER): Payer: Medicare Other | Admitting: Surgery

## 2012-08-03 ENCOUNTER — Ambulatory Visit: Payer: Self-pay

## 2012-08-03 DIAGNOSIS — I6529 Occlusion and stenosis of unspecified carotid artery: Secondary | ICD-10-CM | POA: Diagnosis not present

## 2012-08-03 DIAGNOSIS — Z48812 Encounter for surgical aftercare following surgery on the circulatory system: Secondary | ICD-10-CM

## 2012-08-03 NOTE — Progress Notes (Signed)
[facility] [place]   Patient name: Stacy [name] MRN: [id number] DOB: 1926-07-01 Sex: female     Chief Complaint  Patient presents with  . Re-evaluation    discuss finding on carotid duplex 06/08/2012    HISTORY OF PRESENT ILLNESS: The patient comes back today for followup of her carotid artery occlusive disease and small dome aortic aneurysm. She continues to be asymptomatic. Specifically, she denies numbness or weakness in either extremity. She denies slurred speech. She denies amaurosis fugax. She does not complain of any abdominal pain.  Past Medical History  Diagnosis Date  . Atheroembolism   . Peptic ulcer disease   . GERD (gastroesophageal reflux disease)   . Hyperlipidemia   . Renal artery stenosis   . Hemorrhoids   . Osteopenia   . DM (diabetes mellitus)   . CAD (coronary artery disease)   . Chronic kidney disease, stage III (moderate)   . Hypertension   . Myocardial infarction 03/15/95  . Heart murmur   . Leg pain   . Hiatal hernia   . Arthritis     Cervical DDD C5-6 and C6-7  . Depression   . History of shingles   . DDD (degenerative disc disease)   . Blue toe syndrome   . Hemorrhoid   . Thyroid nodule   . Cancer     skin    Past Surgical History  Procedure Laterality Date  . Cataract extraction    . Tonsillectomy    . Ptca    . Appendectomy    . Hemorrhoid surgery    . Incisional hernia repair    . Basal cell cancer removal      forehead  . Dilation and curettage of uterus    . Endometrial biopsy    . Inguinal hernia repair    . Squamous cell carcinoma excision      left lower leg  . Coronary stent placement  2000    History   Social History  . Marital Status: Widowed    Spouse Name: N/A    Number of Children: 1  . Years of Education: N/A   Occupational History  . Retired     [employer]   Social History Main Topics  . Smoking status: Former Smoker -- 1.00 packs/day for 54 years    Types: Cigarettes    Quit  date: 10/05/1998  . Smokeless tobacco: Never Used  . Alcohol Use: No  . Drug Use: No  . Sexually Active: No   Other Topics Concern  . Not on file   Social History Narrative  . No narrative on file    Family History  Problem Relation Age of Onset  . Diabetes Father   . Prostate cancer Father     meta to colon  . Cancer Father   . Parkinsonism Mother   . Stroke Paternal Uncle     Allergies as of 08/03/2012 - Review Complete 08/03/2012  Allergen Reaction Noted  . Iohexol  03/21/2005  . Latex  10/18/2010  . Sulfonamide derivatives  06/20/2008  . Tranilast  08/03/2012    Current Outpatient Prescriptions on File Prior to Visit  Medication Sig Dispense Refill  . aspirin 81 MG tablet Take 325 mg by mouth daily.       . cholecalciferol (VITAMIN D) 400 UNITS TABS Take 400 Units by mouth daily.       . clopidogrel (PLAVIX) 75 MG tablet Take 1 tablet (75 mg total) by mouth daily.  30 tablet  12  . Coenzyme Q10 (COQ-10 PO) Take 100 mg by mouth daily. 1 tab po qd      . conjugated estrogens (PREMARIN) vaginal cream 0.5 g once a week. .625 mg once weekly      . CRESTOR 10 MG tablet TAKE ONE TABLET DAILY  30 tablet  11  . cyanocobalamin 500 MCG tablet Take 500 mcg by mouth 2 (two) times daily.        Marland Kitchen dexlansoprazole (DEXILANT) 60 MG capsule Take 1 tablet ( Dexilant DR ) by mouth once daily      . hydrochlorothiazide (HYDRODIURIL) 25 MG tablet TAKE 1/2 TABLET EVERY DAY  15 tablet  6  . ibuprofen (ADVIL) 200 MG tablet Take 200 mg by mouth as needed.      Marland Kitchen ketoconazole (NIZORAL) 2 % cream Apply topically daily as needed.      . metoprolol succinate (TOPROL-XL) 25 MG 24 hr tablet Take 1 tablet (25 mg total) by mouth daily.  30 tablet  6  . Multiple Vitamins-Minerals (CENTRUM SILVER PO) 1 tab po qd       . nitroGLYCERIN (NITROSTAT) 0.4 MG SL tablet Place 1 tablet (0.4 mg total) under the tongue every 5 (five) minutes as needed.  25 tablet  3  . polyethylene glycol powder  (GLYCOLAX/MIRALAX) powder Take 17 g by mouth daily.      . ramipril (ALTACE) 2.5 MG capsule TAKE ONE CAPSULE BY MOUTH TWICE A DAY  60 capsule  11  . sertraline (ZOLOFT) 25 MG tablet Take 25 mg by mouth daily.       . diphenhydrAMINE (SOMINEX) 25 MG tablet Take 25 mg by mouth at bedtime as needed for sleep.       No current facility-administered medications on file prior to visit.     REVIEW OF SYSTEMS: No changes from prior visit  PHYSICAL EXAMINATION:   Vital signs are BP 120/74  Pulse 60  Ht 5' 2.5" (1.588 m)  Wt 148 lb 8 oz (67.359 kg)  BMI 26.71 kg/m2  SpO2 96% General: The patient appears their stated age. HEENT:  No gross abnormalities Pulmonary:  Non labored breathing Abdomen: Soft and non-tender Musculoskeletal: There are no major deformities. Neurologic: No focal weakness or paresthesias are detected, Skin: There are no ulcer or rashes noted. Psychiatric: The patient has normal affect. Cardiovascular: There is a regular rate and rhythm without significant murmur appreciated. No carotid bruit   Diagnostic Studies I have reviewed her ultrasound from April 2014. This shows 40-59% stenosis of the right internal carotid artery and no significant left carotid artery stenosis. Previously, she had been in the 60-79% stenosis category in the right internal carotid artery.  Assessment: #1: Carotid stenosis #2: Abdominal aortic aneurysm Plan: #1: The patient is now in the 40-59% stenosis category, which is a slight decrease from her prior study. However her end diastolic velocity is similar as is her systolic velocity. I explained this to the patient. I do not think there has been a significant change or that there is anything abnormal about her prior ultrasound studies. She will continue with ultrasound surveillance. The next day will be in one year.  #2: Abdominal aortic aneurysm. She is scheduled for a abdominal ultrasound in one year with her next visit.  Eldridge Abrahams, M.D. [facility] [place] Office: [phone] Pager:  [phone]

## 2012-08-04 ENCOUNTER — Ambulatory Visit
Admission: RE | Admit: 2012-08-04 | Discharge: 2012-08-04 | Disposition: A | Discharge status: Home / Self Care | Payer: Medicare Other | Source: Ambulatory Visit

## 2012-08-04 ENCOUNTER — Encounter: Payer: Self-pay | Admitting: Surgery

## 2012-08-04 DIAGNOSIS — Z1231 Encounter for screening mammogram for malignant neoplasm of breast: Secondary | ICD-10-CM

## 2012-08-04 NOTE — Addendum Note (Signed)
Addended by: Dorthula Rue L on: 08/04/2012 03:07 PM   Modules accepted: Orders

## 2012-08-06 DIAGNOSIS — M5137 Other intervertebral disc degeneration, lumbosacral region: Secondary | ICD-10-CM | POA: Diagnosis not present

## 2012-08-06 DIAGNOSIS — M999 Biomechanical lesion, unspecified: Secondary | ICD-10-CM | POA: Diagnosis not present

## 2012-08-06 DIAGNOSIS — M5430 Sciatica, unspecified side: Secondary | ICD-10-CM | POA: Diagnosis not present

## 2012-08-06 DIAGNOSIS — IMO0002 Reserved for concepts with insufficient information to code with codable children: Secondary | ICD-10-CM | POA: Diagnosis not present

## 2012-08-17 DIAGNOSIS — L821 Other seborrheic keratosis: Secondary | ICD-10-CM | POA: Diagnosis not present

## 2012-08-17 DIAGNOSIS — L820 Inflamed seborrheic keratosis: Secondary | ICD-10-CM | POA: Diagnosis not present

## 2012-08-17 DIAGNOSIS — D239 Other benign neoplasm of skin, unspecified: Secondary | ICD-10-CM | POA: Diagnosis not present

## 2012-08-17 DIAGNOSIS — Z85828 Personal history of other malignant neoplasm of skin: Secondary | ICD-10-CM | POA: Diagnosis not present

## 2012-08-17 DIAGNOSIS — L570 Actinic keratosis: Secondary | ICD-10-CM | POA: Diagnosis not present

## 2012-08-17 DIAGNOSIS — L723 Sebaceous cyst: Secondary | ICD-10-CM | POA: Diagnosis not present

## 2012-08-20 DIAGNOSIS — IMO0002 Reserved for concepts with insufficient information to code with codable children: Secondary | ICD-10-CM | POA: Diagnosis not present

## 2012-08-20 DIAGNOSIS — M5137 Other intervertebral disc degeneration, lumbosacral region: Secondary | ICD-10-CM | POA: Diagnosis not present

## 2012-08-20 DIAGNOSIS — M999 Biomechanical lesion, unspecified: Secondary | ICD-10-CM | POA: Diagnosis not present

## 2012-08-20 DIAGNOSIS — M5430 Sciatica, unspecified side: Secondary | ICD-10-CM | POA: Diagnosis not present

## 2012-08-31 DIAGNOSIS — N318 Other neuromuscular dysfunction of bladder: Secondary | ICD-10-CM | POA: Diagnosis not present

## 2012-08-31 DIAGNOSIS — N362 Urethral caruncle: Secondary | ICD-10-CM | POA: Diagnosis not present

## 2012-09-08 ENCOUNTER — Encounter: Payer: Self-pay | Admitting: Cardiovascular Disease

## 2012-09-08 DIAGNOSIS — E785 Hyperlipidemia, unspecified: Secondary | ICD-10-CM | POA: Diagnosis not present

## 2012-09-08 DIAGNOSIS — I10 Essential (primary) hypertension: Secondary | ICD-10-CM | POA: Diagnosis not present

## 2012-09-08 DIAGNOSIS — D518 Other vitamin B12 deficiency anemias: Secondary | ICD-10-CM | POA: Diagnosis not present

## 2012-09-08 DIAGNOSIS — E119 Type 2 diabetes mellitus without complications: Secondary | ICD-10-CM | POA: Diagnosis not present

## 2012-09-09 ENCOUNTER — Ambulatory Visit (HOSPITAL_COMMUNITY): Payer: Self-pay | Admitting: Dentistry

## 2012-09-09 ENCOUNTER — Encounter (HOSPITAL_COMMUNITY): Payer: Self-pay | Admitting: Dentistry

## 2012-09-09 VITALS — BP 107/64 | HR 60 | Temp 97.7°F

## 2012-09-09 DIAGNOSIS — M999 Biomechanical lesion, unspecified: Secondary | ICD-10-CM | POA: Diagnosis not present

## 2012-09-09 DIAGNOSIS — K036 Deposits [accretions] on teeth: Secondary | ICD-10-CM

## 2012-09-09 DIAGNOSIS — M5137 Other intervertebral disc degeneration, lumbosacral region: Secondary | ICD-10-CM | POA: Diagnosis not present

## 2012-09-09 DIAGNOSIS — K0530 Chronic periodontitis, unspecified: Secondary | ICD-10-CM

## 2012-09-09 DIAGNOSIS — IMO0002 Reserved for concepts with insufficient information to code with codable children: Secondary | ICD-10-CM | POA: Diagnosis not present

## 2012-09-09 DIAGNOSIS — M5430 Sciatica, unspecified side: Secondary | ICD-10-CM | POA: Diagnosis not present

## 2012-09-09 DIAGNOSIS — K056 Periodontal disease, unspecified: Secondary | ICD-10-CM

## 2012-09-09 NOTE — Progress Notes (Signed)
09/09/2012  Patient:            Stacy [name] Date of Birth:  Oct 10, 1926 MRN:                [id number]  BP 107/64  Pulse 60  Temp(Src) 97.7 F (36.5 C) (Oral)  Stacy [name] is an 79 year old [name] that presents for periodic oral exam.  Premedication: None required.  Medical Hx Update:  Past Medical History  Diagnosis Date  . Atheroembolism   . Peptic ulcer disease   . GERD (gastroesophageal reflux disease)   . Hyperlipidemia   . Renal artery stenosis   . Hemorrhoids   . Osteopenia   . DM (diabetes mellitus)   . CAD (coronary artery disease)   . Chronic kidney disease, stage III (moderate)   . Hypertension   . Myocardial infarction 03/15/95  . Heart murmur   . Leg pain   . Hiatal hernia   . Arthritis     Cervical DDD C5-6 and C6-7  . Depression   . History of shingles   . DDD (degenerative disc disease)   . Blue toe syndrome   . Hemorrhoid   . Thyroid nodule   . Cancer     skin  . ALLERGIES/ADVERSE DRUG REACTIONS: Allergies  Allergen Reactions  . Iohexol      Desc: HIVES- 13 HR PRE-MEDS ARE REQUIRED-ASM- 03/21/05,SULFA,ADHESIVE TAPE   . Latex     Adhesive tape and EKG adhesive leads to rash  . Sulfonamide Derivatives     REACTION: hives  . Tranilast    MEDICATIONS: Current Outpatient Prescriptions  Medication Sig Dispense Refill  . aspirin 81 MG tablet Take 325 mg by mouth daily.       . cholecalciferol (VITAMIN D) 400 UNITS TABS Take 400 Units by mouth daily.       . clopidogrel (PLAVIX) 75 MG tablet Take 1 tablet (75 mg total) by mouth daily.  30 tablet  12  . Coenzyme Q10 (COQ-10 PO) Take 100 mg by mouth daily. 1 tab po qd      . conjugated estrogens (PREMARIN) vaginal cream 0.5 g once a week. .625 mg once weekly      . CRESTOR 10 MG tablet TAKE ONE TABLET DAILY  30 tablet  11  . cyanocobalamin 500 MCG tablet Take 500 mcg by mouth 2 (two) times daily.        Marland Kitchen dexlansoprazole (DEXILANT) 60 MG capsule Take 1 tablet ( Dexilant DR ) by mouth once daily       . diphenhydrAMINE (SOMINEX) 25 MG tablet Take 25 mg by mouth at bedtime as needed for sleep.      . hydrochlorothiazide (HYDRODIURIL) 25 MG tablet TAKE 1/2 TABLET EVERY DAY  15 tablet  6  . ibuprofen (ADVIL) 200 MG tablet Take 200 mg by mouth as needed.      Marland Kitchen ketoconazole (NIZORAL) 2 % cream Apply topically daily as needed.      . metoprolol succinate (TOPROL-XL) 25 MG 24 hr tablet Take 1 tablet (25 mg total) by mouth daily.  30 tablet  6  . Multiple Vitamins-Minerals (CENTRUM SILVER PO) 1 tab po qd       . nitroGLYCERIN (NITROSTAT) 0.4 MG SL tablet Place 1 tablet (0.4 mg total) under the tongue every 5 (five) minutes as needed.  25 tablet  3  . polyethylene glycol powder (GLYCOLAX/MIRALAX) powder Take 17 g by mouth daily.      . ramipril (  ALTACE) 2.5 MG capsule TAKE ONE CAPSULE BY MOUTH TWICE A DAY  60 capsule  11  . sertraline (ZOLOFT) 25 MG tablet Take 25 mg by mouth daily.        No current facility-administered medications for this visit.    C/C: Patient presents for periodic oral examination and dental cleaning.  HPI: Stacy [name]  is an 79 year old [name] with multiple medical problems that his now seen for periodic oral examination and periodontal recall. She was last seen in May of 2013 for an exam and cleaning. Patient denies having any acute dental problems at this time.      DENTAL EXAM: General:  Patient is a well-developed, well-nourished [name] in no acute distress. Vitals: BP 107/64  Pulse 60  Temp(Src) 97.7 F (36.5 C) (Oral) Extraoral Exam: No palpable lymphadenopathy.  No acute TMJ Symptoms. Intraoral  Exam:  Normal Saliva. No Soft tissue lesions noted. Dentition:  As before. Periodontal: Patient with minimal plaque accumulations, selective areas gingival recession and no significant tooth mobility. Patient with incipient to moderate bone loss. Caries: No caries noted. Bitewings were reviewed with some less than perfect margins of the amalgam restorations.   Endodontic: No history of pulpitits symptoms.  C&B: Stable. Multiple crown and bridge restorations that are acceptable to the patient from functionality standpoint. Patient understands that they are less than ideal from aesthetic standpoint. Patient also is not interested in proceeding with other crown restorations at this time. Prosthodontic: None Occlusion: Stable Radiographic Interpretation: The four bitewings reveal multiple amalgam and crown restorations. Some of the margins are less than ideal, especially with the amalgam restorations. No obvious dental caries are noted.   Procedures: 1. Periodic oral examination 2. Prophylaxis with sonic scaler and hand curettes. [place] Fluoride therapy for 4 minutes. 3. Four Bitewings  Patient tolerated procedure well.  Plan:  1. Return to clinic in 6-12 months for an exam and cleaning. 2. Call if problems arise before then.   Stacy [name] 09/09/2012

## 2012-09-09 NOTE — Patient Instructions (Signed)
Patient is to brush after meals and at bedtime and floss at bedtime. Patient to return to clinic as scheduled for periodontal recall and exam. Patient to call if acute problems arise before then. Lenn Cal, DDS

## 2012-09-10 DIAGNOSIS — E785 Hyperlipidemia, unspecified: Secondary | ICD-10-CM | POA: Diagnosis not present

## 2012-09-10 DIAGNOSIS — F341 Dysthymic disorder: Secondary | ICD-10-CM | POA: Diagnosis not present

## 2012-09-10 DIAGNOSIS — I10 Essential (primary) hypertension: Secondary | ICD-10-CM | POA: Diagnosis not present

## 2012-09-10 DIAGNOSIS — E119 Type 2 diabetes mellitus without complications: Secondary | ICD-10-CM | POA: Diagnosis not present

## 2012-09-10 DIAGNOSIS — N390 Urinary tract infection, site not specified: Secondary | ICD-10-CM | POA: Diagnosis not present

## 2012-09-14 ENCOUNTER — Other Ambulatory Visit: Payer: Self-pay | Admitting: *Deleted

## 2012-09-14 MED ORDER — CLOPIDOGREL BISULFATE 75 MG PO TABS: 75.0000 mg | ORAL_TABLET | Freq: Every day | ORAL | Status: DC

## 2012-09-15 DIAGNOSIS — E119 Type 2 diabetes mellitus without complications: Secondary | ICD-10-CM | POA: Diagnosis not present

## 2012-09-15 DIAGNOSIS — E042 Nontoxic multinodular goiter: Secondary | ICD-10-CM | POA: Diagnosis not present

## 2012-10-01 DIAGNOSIS — M5430 Sciatica, unspecified side: Secondary | ICD-10-CM | POA: Diagnosis not present

## 2012-10-01 DIAGNOSIS — M999 Biomechanical lesion, unspecified: Secondary | ICD-10-CM | POA: Diagnosis not present

## 2012-10-01 DIAGNOSIS — M5137 Other intervertebral disc degeneration, lumbosacral region: Secondary | ICD-10-CM | POA: Diagnosis not present

## 2012-10-01 DIAGNOSIS — IMO0002 Reserved for concepts with insufficient information to code with codable children: Secondary | ICD-10-CM | POA: Diagnosis not present

## 2012-10-13 ENCOUNTER — Other Ambulatory Visit: Payer: Self-pay | Admitting: *Deleted

## 2012-10-13 DIAGNOSIS — I701 Atherosclerosis of renal artery: Secondary | ICD-10-CM

## 2012-10-13 DIAGNOSIS — E785 Hyperlipidemia, unspecified: Secondary | ICD-10-CM

## 2012-10-13 DIAGNOSIS — I10 Essential (primary) hypertension: Secondary | ICD-10-CM

## 2012-10-13 DIAGNOSIS — I2510 Atherosclerotic heart disease of native coronary artery without angina pectoris: Secondary | ICD-10-CM

## 2012-10-13 MED ORDER — METOPROLOL SUCCINATE ER 25 MG PO TB24: 25.0000 mg | ORAL_TABLET | Freq: Every day | ORAL | Status: DC

## 2012-10-19 DIAGNOSIS — M5430 Sciatica, unspecified side: Secondary | ICD-10-CM | POA: Diagnosis not present

## 2012-10-19 DIAGNOSIS — M999 Biomechanical lesion, unspecified: Secondary | ICD-10-CM | POA: Diagnosis not present

## 2012-10-19 DIAGNOSIS — M5137 Other intervertebral disc degeneration, lumbosacral region: Secondary | ICD-10-CM | POA: Diagnosis not present

## 2012-10-19 DIAGNOSIS — IMO0002 Reserved for concepts with insufficient information to code with codable children: Secondary | ICD-10-CM | POA: Diagnosis not present

## 2012-11-02 DIAGNOSIS — IMO0002 Reserved for concepts with insufficient information to code with codable children: Secondary | ICD-10-CM | POA: Diagnosis not present

## 2012-11-02 DIAGNOSIS — M999 Biomechanical lesion, unspecified: Secondary | ICD-10-CM | POA: Diagnosis not present

## 2012-11-02 DIAGNOSIS — M5430 Sciatica, unspecified side: Secondary | ICD-10-CM | POA: Diagnosis not present

## 2012-11-02 DIAGNOSIS — M5137 Other intervertebral disc degeneration, lumbosacral region: Secondary | ICD-10-CM | POA: Diagnosis not present

## 2012-11-04 ENCOUNTER — Encounter: Payer: Self-pay | Admitting: Cardiovascular Disease

## 2012-11-04 ENCOUNTER — Ambulatory Visit (INDEPENDENT_AMBULATORY_CARE_PROVIDER_SITE_OTHER): Payer: Medicare Other | Admitting: Cardiovascular Disease

## 2012-11-04 VITALS — BP 120/84 | HR 63 | Wt 154.0 lb

## 2012-11-04 DIAGNOSIS — I10 Essential (primary) hypertension: Secondary | ICD-10-CM

## 2012-11-04 DIAGNOSIS — I701 Atherosclerosis of renal artery: Secondary | ICD-10-CM | POA: Diagnosis not present

## 2012-11-04 DIAGNOSIS — I2510 Atherosclerotic heart disease of native coronary artery without angina pectoris: Secondary | ICD-10-CM | POA: Diagnosis not present

## 2012-11-04 MED ORDER — ASPIRIN 81 MG PO TABS: 81.0000 mg | ORAL_TABLET | Freq: Every day | ORAL | Status: DC

## 2012-11-04 MED ORDER — RAMIPRIL 2.5 MG PO CAPS: 2.5000 mg | ORAL_CAPSULE | Freq: Every day | ORAL | Status: DC

## 2012-11-04 NOTE — Patient Instructions (Addendum)
Your physician has recommended you make the following change in your medication: STOP HCTZ, CHANGE Altace (ramipril) to once at bedtime, DECREASE Aspirin to 81mg  once a day  Your physician wants you to follow-up in: 6 MONTHS with Dr Burt Knack.  You will receive a reminder letter in the mail two months in advance. If you don't receive a letter, please call our office to schedule the follow-up appointment.

## 2012-11-04 NOTE — Progress Notes (Signed)
HPI:  79 year old woman presenting for followup evaluation. She is followed for coronary artery disease and initially presented in 1997 with an anterior MI treated with balloon angioplasty of the LAD. She subsequently underwent stenting of the right coronary artery in 2000. Because of some persistent symptoms, she underwent repeat heart catheterization in 2008 and this demonstrated patency of her previous angioplasty and stent sites. The patient has been followed by Dr. Lia Foyer. She presents today for followup and I will be seeing her from here forward.  Overall she is doing well. She has some chronic fatigue. She complains of lightheadedness. She denies chest pain or pressure, dyspnea, edema, or palpitations. 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  Outpatient Encounter Prescriptions as of 11/04/2012  Medication Sig Dispense Refill  . aspirin 81 MG tablet Take 325 mg by mouth daily.       . cholecalciferol (VITAMIN D) 400 UNITS TABS Take 400 Units by mouth daily.       . clopidogrel (PLAVIX) 75 MG tablet Take 1 tablet (75 mg total) by mouth daily.  30 tablet  2  . Coenzyme Q10 (COQ-10 PO) Take 100 mg by mouth daily. 1 tab po qd      . conjugated estrogens (PREMARIN) vaginal cream 0.5 g once a week. .625 mg once weekly      . CRESTOR 10 MG tablet TAKE ONE TABLET DAILY  30 tablet  11  . cyanocobalamin 500 MCG tablet Take 500 mcg by mouth 2 (two) times daily.        Marland Kitchen dexlansoprazole (DEXILANT) 60 MG capsule Take 1 tablet ( Dexilant DR ) by mouth once daily      . diphenhydrAMINE (SOMINEX) 25 MG tablet Take 25 mg by mouth at bedtime as needed for sleep.      . hydrochlorothiazide (HYDRODIURIL) 25 MG tablet TAKE 1/2 TABLET EVERY DAY  15 tablet  6  . ibuprofen (ADVIL) 200 MG tablet Take 200 mg by mouth as needed.      Marland Kitchen ketoconazole (NIZORAL) 2 % cream Apply topically daily as needed.      . metoprolol  succinate (TOPROL-XL) 25 MG 24 hr tablet Take 1 tablet (25 mg total) by mouth daily.  30 tablet  1  . Multiple Vitamins-Minerals (CENTRUM SILVER PO) 1 tab po qd       . nitroGLYCERIN (NITROSTAT) 0.4 MG SL tablet Place 1 tablet (0.4 mg total) under the tongue every 5 (five) minutes as needed.  25 tablet  3  . polyethylene glycol powder (GLYCOLAX/MIRALAX) powder Take 17 g by mouth daily.      . ramipril (ALTACE) 2.5 MG capsule TAKE ONE CAPSULE BY MOUTH TWICE A DAY  60 capsule  11  . sertraline (ZOLOFT) 25 MG tablet Take 25 mg by mouth daily.        No facility-administered encounter medications on file as of 11/04/2012.    Allergies  Allergen Reactions  . Iohexol      Desc: HIVES- 13 HR PRE-MEDS ARE REQUIRED-ASM- 03/21/05,SULFA,ADHESIVE TAPE   . Latex     Adhesive tape and EKG adhesive leads to rash  . Sulfonamide Derivatives     REACTION: hives  . Tranilast     Past Medical History  Diagnosis Date  . Atheroembolism   . Peptic ulcer disease   . GERD (gastroesophageal reflux disease)   . Hyperlipidemia   . Renal artery  stenosis   . Hemorrhoids   . Osteopenia   . DM (diabetes mellitus)   . CAD (coronary artery disease)   . Chronic kidney disease, stage III (moderate)   . Hypertension   . Myocardial infarction 03/15/95  . Heart murmur   . Leg pain   . Hiatal hernia   . Arthritis     Cervical DDD C5-6 and C6-7  . Depression   . History of shingles   . DDD (degenerative disc disease)   . Blue toe syndrome   . Hemorrhoid   . Thyroid nodule   . Cancer     skin    ROS: Negative except as per HPI  BP 120/84  Pulse 63  Wt 154 lb (69.854 kg)  BMI 27.7 kg/m2  PHYSICAL EXAM: Pt is alert and oriented, pleasant elderly woman in NAD HEENT: normal Neck: JVP - normal, carotids 2+= with bilateral bruits Lungs: CTA bilaterally CV: RRR without murmur or gallop Abd: soft, NT, Positive BS, no hepatomegaly Ext: no C/C/E, distal pulses intact and equal Skin: warm/dry no  rash  EKG:  Sinus rhythm 63 beats per minute, sinus arrhythmia noted, inferior infarct age undetermined, anterolateral infarct age undetermined.  ASSESSMENT AND PLAN: 1. Coronary artery disease, native vessel. The patient is stable without anginal symptoms. She will remain on dual antiplatelet therapy with aspirin and Plavix which had been continued long term. She will be seen back in followup in 6 months.  2. Essential hypertension. She appears to be symptomatic with generalized fatigue and lightheadedness. I think her blood pressure may be overtreated. Recommended discontinuation of hydrochlorothiazide and reduction of her rramipril dose to 2.5 mg at bedtime only. She will continue to follow her blood pressure at cardiac rehabilitation.  3. Hyperlipidemia. The patient takes Crestor 10 mg. She brought a copy of her lipids from 09/10/2012. Cholesterol is 138, triglycerides 171, HDL 45, and LDL 59.  For followup I will see her back in 6 months.  Sherren Mocha 11/04/2012 6:21 PM

## 2012-11-16 DIAGNOSIS — IMO0002 Reserved for concepts with insufficient information to code with codable children: Secondary | ICD-10-CM | POA: Diagnosis not present

## 2012-11-16 DIAGNOSIS — M999 Biomechanical lesion, unspecified: Secondary | ICD-10-CM | POA: Diagnosis not present

## 2012-11-16 DIAGNOSIS — M5137 Other intervertebral disc degeneration, lumbosacral region: Secondary | ICD-10-CM | POA: Diagnosis not present

## 2012-11-16 DIAGNOSIS — M5430 Sciatica, unspecified side: Secondary | ICD-10-CM | POA: Diagnosis not present

## 2012-11-18 DIAGNOSIS — IMO0002 Reserved for concepts with insufficient information to code with codable children: Secondary | ICD-10-CM | POA: Diagnosis not present

## 2012-11-18 DIAGNOSIS — M5137 Other intervertebral disc degeneration, lumbosacral region: Secondary | ICD-10-CM | POA: Diagnosis not present

## 2012-11-18 DIAGNOSIS — M5430 Sciatica, unspecified side: Secondary | ICD-10-CM | POA: Diagnosis not present

## 2012-11-18 DIAGNOSIS — M999 Biomechanical lesion, unspecified: Secondary | ICD-10-CM | POA: Diagnosis not present

## 2012-12-14 DIAGNOSIS — M5137 Other intervertebral disc degeneration, lumbosacral region: Secondary | ICD-10-CM | POA: Diagnosis not present

## 2012-12-14 DIAGNOSIS — IMO0002 Reserved for concepts with insufficient information to code with codable children: Secondary | ICD-10-CM | POA: Diagnosis not present

## 2012-12-14 DIAGNOSIS — M999 Biomechanical lesion, unspecified: Secondary | ICD-10-CM | POA: Diagnosis not present

## 2012-12-14 DIAGNOSIS — M5430 Sciatica, unspecified side: Secondary | ICD-10-CM | POA: Diagnosis not present

## 2012-12-18 ENCOUNTER — Other Ambulatory Visit: Payer: Self-pay | Admitting: Physician Assistant

## 2012-12-18 ENCOUNTER — Other Ambulatory Visit: Payer: Self-pay | Admitting: Cardiology

## 2012-12-21 ENCOUNTER — Other Ambulatory Visit: Payer: Self-pay | Admitting: *Deleted

## 2012-12-21 MED ORDER — CLOPIDOGREL BISULFATE 75 MG PO TABS: 75.0000 mg | ORAL_TABLET | Freq: Every day | ORAL | Status: DC

## 2012-12-29 DIAGNOSIS — E785 Hyperlipidemia, unspecified: Secondary | ICD-10-CM | POA: Diagnosis not present

## 2012-12-29 DIAGNOSIS — E041 Nontoxic single thyroid nodule: Secondary | ICD-10-CM | POA: Diagnosis not present

## 2012-12-29 DIAGNOSIS — E119 Type 2 diabetes mellitus without complications: Secondary | ICD-10-CM | POA: Diagnosis not present

## 2012-12-29 DIAGNOSIS — I10 Essential (primary) hypertension: Secondary | ICD-10-CM | POA: Diagnosis not present

## 2012-12-31 ENCOUNTER — Other Ambulatory Visit (HOSPITAL_COMMUNITY): Payer: Self-pay | Admitting: Legal Medicine

## 2012-12-31 DIAGNOSIS — I10 Essential (primary) hypertension: Secondary | ICD-10-CM | POA: Diagnosis not present

## 2012-12-31 DIAGNOSIS — E119 Type 2 diabetes mellitus without complications: Secondary | ICD-10-CM | POA: Diagnosis not present

## 2012-12-31 DIAGNOSIS — E041 Nontoxic single thyroid nodule: Secondary | ICD-10-CM

## 2012-12-31 DIAGNOSIS — E785 Hyperlipidemia, unspecified: Secondary | ICD-10-CM | POA: Diagnosis not present

## 2012-12-31 DIAGNOSIS — F341 Dysthymic disorder: Secondary | ICD-10-CM | POA: Diagnosis not present

## 2013-01-03 ENCOUNTER — Other Ambulatory Visit: Payer: Self-pay | Admitting: Cardiology

## 2013-01-12 ENCOUNTER — Ambulatory Visit (HOSPITAL_COMMUNITY)
Admission: RE | Admit: 2013-01-12 | Discharge: 2013-01-12 | Disposition: A | Discharge status: Home / Self Care | Payer: Medicare Other | Source: Ambulatory Visit | Attending: Legal Medicine | Admitting: Legal Medicine

## 2013-01-12 DIAGNOSIS — E042 Nontoxic multinodular goiter: Secondary | ICD-10-CM | POA: Diagnosis not present

## 2013-01-12 DIAGNOSIS — M5137 Other intervertebral disc degeneration, lumbosacral region: Secondary | ICD-10-CM | POA: Diagnosis not present

## 2013-01-12 DIAGNOSIS — M5430 Sciatica, unspecified side: Secondary | ICD-10-CM | POA: Diagnosis not present

## 2013-01-12 DIAGNOSIS — E041 Nontoxic single thyroid nodule: Secondary | ICD-10-CM

## 2013-01-12 DIAGNOSIS — M999 Biomechanical lesion, unspecified: Secondary | ICD-10-CM | POA: Diagnosis not present

## 2013-01-12 DIAGNOSIS — IMO0002 Reserved for concepts with insufficient information to code with codable children: Secondary | ICD-10-CM | POA: Diagnosis not present

## 2013-02-01 DIAGNOSIS — M5137 Other intervertebral disc degeneration, lumbosacral region: Secondary | ICD-10-CM | POA: Diagnosis not present

## 2013-02-01 DIAGNOSIS — M999 Biomechanical lesion, unspecified: Secondary | ICD-10-CM | POA: Diagnosis not present

## 2013-02-01 DIAGNOSIS — M5430 Sciatica, unspecified side: Secondary | ICD-10-CM | POA: Diagnosis not present

## 2013-02-01 DIAGNOSIS — IMO0002 Reserved for concepts with insufficient information to code with codable children: Secondary | ICD-10-CM | POA: Diagnosis not present

## 2013-02-10 ENCOUNTER — Other Ambulatory Visit: Payer: Self-pay | Admitting: Cardiology

## 2013-02-22 DIAGNOSIS — M5137 Other intervertebral disc degeneration, lumbosacral region: Secondary | ICD-10-CM | POA: Diagnosis not present

## 2013-02-22 DIAGNOSIS — M999 Biomechanical lesion, unspecified: Secondary | ICD-10-CM | POA: Diagnosis not present

## 2013-02-22 DIAGNOSIS — IMO0002 Reserved for concepts with insufficient information to code with codable children: Secondary | ICD-10-CM | POA: Diagnosis not present

## 2013-02-22 DIAGNOSIS — M5430 Sciatica, unspecified side: Secondary | ICD-10-CM | POA: Diagnosis not present

## 2013-03-17 DIAGNOSIS — E042 Nontoxic multinodular goiter: Secondary | ICD-10-CM | POA: Diagnosis not present

## 2013-03-17 DIAGNOSIS — E119 Type 2 diabetes mellitus without complications: Secondary | ICD-10-CM | POA: Diagnosis not present

## 2013-03-26 ENCOUNTER — Other Ambulatory Visit: Payer: Self-pay | Admitting: Surgery

## 2013-03-26 DIAGNOSIS — I714 Abdominal aortic aneurysm, without rupture, unspecified: Secondary | ICD-10-CM

## 2013-03-26 DIAGNOSIS — I6529 Occlusion and stenosis of unspecified carotid artery: Secondary | ICD-10-CM

## 2013-04-15 ENCOUNTER — Ambulatory Visit (INDEPENDENT_AMBULATORY_CARE_PROVIDER_SITE_OTHER): Payer: Medicare Other | Admitting: Cardiovascular Disease

## 2013-04-15 ENCOUNTER — Encounter: Payer: Self-pay | Admitting: Cardiovascular Disease

## 2013-04-15 VITALS — BP 120/70 | HR 62 | Ht 62.5 in | Wt 154.0 lb

## 2013-04-15 DIAGNOSIS — I6529 Occlusion and stenosis of unspecified carotid artery: Secondary | ICD-10-CM

## 2013-04-15 DIAGNOSIS — I2510 Atherosclerotic heart disease of native coronary artery without angina pectoris: Secondary | ICD-10-CM

## 2013-04-15 DIAGNOSIS — I252 Old myocardial infarction: Secondary | ICD-10-CM | POA: Diagnosis not present

## 2013-04-15 DIAGNOSIS — E785 Hyperlipidemia, unspecified: Secondary | ICD-10-CM

## 2013-04-15 DIAGNOSIS — I10 Essential (primary) hypertension: Secondary | ICD-10-CM

## 2013-04-15 DIAGNOSIS — I701 Atherosclerosis of renal artery: Secondary | ICD-10-CM

## 2013-04-15 MED ORDER — RAMIPRIL 2.5 MG PO CAPS: 2.5000 mg | ORAL_CAPSULE | Freq: Two times a day (BID) | ORAL | Status: DC

## 2013-04-15 MED ORDER — HYDROCHLOROTHIAZIDE 12.5 MG PO CAPS: 12.5000 mg | ORAL_CAPSULE | Freq: Every day | ORAL | Status: DC

## 2013-04-15 NOTE — Patient Instructions (Addendum)
Your physician has recommended you make the following change in your medication:  INCREASE Ramipril to 2.5 mg twice daily TAKE HCTZ 12.5 mg daily (patient reports she has these at home from previous Rx)  Your physician wants you to follow-up in: 6 months with Dr. Burt Knack. You will receive a reminder letter in the mail two months in advance. If you don't receive a letter, please call our office to schedule the follow-up appointment.

## 2013-04-19 ENCOUNTER — Encounter: Payer: Self-pay | Admitting: Cardiovascular Disease

## 2013-04-20 ENCOUNTER — Encounter: Payer: Self-pay | Admitting: Cardiovascular Disease

## 2013-04-20 NOTE — Progress Notes (Signed)
HPI:   80 year old woman presenting for followup evaluation. She is followed for coronary artery disease and initially presented in 1997 with an anterior MI treated with balloon angioplasty of the LAD. She subsequently underwent stenting of the right coronary artery in 2000. Because of some persistent symptoms, she underwent repeat heart catheterization in 2008 and this demonstrated patency of her previous angioplasty and stent sites.   Overall the patient is doing well. When I last saw her she had some low blood pressures and her medications were reduced. She noticed leg swelling since she stopped HCTZ and has started it back on her own. She's also had some elevated BP readings in the afternoons. Otherwise she has no complaints and specifically denies chest pain or pressure. She denies orthopnea, PND, or palipitations.   Recent labs show a creatinine of 0.86, cholesterol 146, LDL 64, and HDL 52.    Outpatient Encounter Prescriptions as of 04/15/2013  Medication Sig  . aspirin 81 MG tablet Take 1 tablet (81 mg total) by mouth daily.  . cholecalciferol (VITAMIN D) 400 UNITS TABS Take 400 Units by mouth daily.   . clopidogrel (PLAVIX) 75 MG tablet Take 1 tablet (75 mg total) by mouth daily.  . Coenzyme Q10 (COQ-10 PO) Take 100 mg by mouth daily. 1 tab po qd  . conjugated estrogens (PREMARIN) vaginal cream 0.5 g once a week. .625 mg once weekly  . CRESTOR 10 MG tablet TAKE 1 TABLET EVERY DAY  . cyanocobalamin 500 MCG tablet Take 500 mcg by mouth 2 (two) times daily.    Marland Kitchen dexlansoprazole (DEXILANT) 60 MG capsule Take 1 tablet ( Dexilant DR ) by mouth once daily  . diphenhydrAMINE (SOMINEX) 25 MG tablet Take 25 mg by mouth at bedtime as needed for sleep.  . hydrochlorothiazide (MICROZIDE) 12.5 MG capsule Take 1 capsule (12.5 mg total) by mouth daily.  Marland Kitchen ibuprofen (ADVIL) 200 MG tablet Take 200 mg by mouth as needed.  Marland Kitchen ketoconazole (NIZORAL) 2 % cream Apply topically daily as needed.  .  metoprolol succinate (TOPROL-XL) 25 MG 24 hr tablet TAKE 1 TABLET BY MOUTH EVERY DAY  . Multiple Vitamins-Minerals (CENTRUM SILVER PO) 1 tab po qd   . nitroGLYCERIN (NITROSTAT) 0.4 MG SL tablet Place 1 tablet (0.4 mg total) under the tongue every 5 (five) minutes as needed.  . polyethylene glycol powder (GLYCOLAX/MIRALAX) powder Take 17 g by mouth daily.  . ramipril (ALTACE) 2.5 MG capsule Take 1 capsule (2.5 mg total) by mouth 2 (two) times daily.  . sertraline (ZOLOFT) 25 MG tablet Take 25 mg by mouth daily.   . [DISCONTINUED] ramipril (ALTACE) 2.5 MG capsule Take 1 capsule (2.5 mg total) by mouth at bedtime.    Allergies  Allergen Reactions  . Iohexol      Desc: HIVES- 13 HR PRE-MEDS ARE REQUIRED-ASM- 03/21/05,SULFA,ADHESIVE TAPE   . Latex     Adhesive tape and EKG adhesive leads to rash  . Sulfonamide Derivatives     REACTION: hives  . Tranilast     Past Medical History  Diagnosis Date  . Atheroembolism   . Peptic ulcer disease   . GERD (gastroesophageal reflux disease)   . Hyperlipidemia   . Renal artery stenosis   . Hemorrhoids   . Osteopenia   . DM (diabetes mellitus)   . CAD (coronary artery disease)   . Chronic kidney disease, stage III (moderate)   . Hypertension   . Myocardial infarction 03/15/95  . Heart murmur   .  Leg pain   . Hiatal hernia   . Arthritis     Cervical DDD C5-6 and C6-7  . Depression   . History of shingles   . DDD (degenerative disc disease)   . Blue toe syndrome   . Hemorrhoid   . Thyroid nodule   . Cancer     skin    ROS: Negative except as per HPI  BP 120/70  Pulse 62  Ht 5' 2.5" (1.588 m)  Wt 154 lb (69.854 kg)  BMI 27.70 kg/m2  PHYSICAL EXAM: Pt is alert and oriented, NAD HEENT: normal Neck: JVP - normal, carotids 2+= without bruits Lungs: CTA bilaterally CV: RRR without murmur or gallop Abd: soft, NT, Positive BS, no hepatomegaly Ext: no C/C/E, distal pulses intact and equal Skin: warm/dry no rash  EKG:  Sinus rhythm  62 bpm. Low voltage. Anterolateral MI age undetermined, inferior MI age undetermined.   ASSESSMENT AND PLAN: 1. CAD, native vessel. No anginal sx's. EKG unchanged. Continue ASA and plavix. F/U in 6 months.  2. HTN - resume HCTZ and ramipril at previous doses.   3. Hyperlipidemia - lipids reviewed and pt will continue crestor. Managed by her PCP.   Sherren Mocha 04/20/2013 12:44 AM

## 2013-05-04 DIAGNOSIS — I10 Essential (primary) hypertension: Secondary | ICD-10-CM | POA: Diagnosis not present

## 2013-05-04 DIAGNOSIS — E119 Type 2 diabetes mellitus without complications: Secondary | ICD-10-CM | POA: Diagnosis not present

## 2013-05-04 DIAGNOSIS — E785 Hyperlipidemia, unspecified: Secondary | ICD-10-CM | POA: Diagnosis not present

## 2013-05-06 DIAGNOSIS — E1129 Type 2 diabetes mellitus with other diabetic kidney complication: Secondary | ICD-10-CM | POA: Diagnosis not present

## 2013-05-06 DIAGNOSIS — N183 Chronic kidney disease, stage 3 unspecified: Secondary | ICD-10-CM | POA: Diagnosis not present

## 2013-05-06 DIAGNOSIS — E785 Hyperlipidemia, unspecified: Secondary | ICD-10-CM | POA: Diagnosis not present

## 2013-05-06 DIAGNOSIS — I701 Atherosclerosis of renal artery: Secondary | ICD-10-CM | POA: Diagnosis not present

## 2013-05-13 DIAGNOSIS — E119 Type 2 diabetes mellitus without complications: Secondary | ICD-10-CM | POA: Diagnosis not present

## 2013-05-13 DIAGNOSIS — H26499 Other secondary cataract, unspecified eye: Secondary | ICD-10-CM | POA: Diagnosis not present

## 2013-06-12 ENCOUNTER — Other Ambulatory Visit: Payer: Self-pay | Admitting: Cardiovascular Disease

## 2013-07-07 DIAGNOSIS — J069 Acute upper respiratory infection, unspecified: Secondary | ICD-10-CM | POA: Diagnosis not present

## 2013-07-07 DIAGNOSIS — J029 Acute pharyngitis, unspecified: Secondary | ICD-10-CM | POA: Diagnosis not present

## 2013-07-11 ENCOUNTER — Other Ambulatory Visit: Payer: Self-pay | Admitting: Cardiovascular Disease

## 2013-08-04 ENCOUNTER — Telehealth: Payer: Self-pay | Admitting: Cardiovascular Disease

## 2013-08-04 NOTE — Telephone Encounter (Signed)
Spoke with daughter and she does not want PA/NP appointment, is just follow up for September. Advised would send message to [place] RN but she is out of the office and not in until next week.

## 2013-08-04 NOTE — Telephone Encounter (Signed)
Follow up     Pt's daughter called to make pt's recall appt, offered PA, pt wants to see Dr.

## 2013-08-06 ENCOUNTER — Encounter: Payer: Self-pay | Admitting: Family

## 2013-08-09 ENCOUNTER — Ambulatory Visit (HOSPITAL_COMMUNITY)
Admission: RE | Admit: 2013-08-09 | Discharge: 2013-08-09 | Disposition: A | Discharge status: Home / Self Care | Payer: Medicare Other | Source: Ambulatory Visit | Attending: Family | Admitting: Family

## 2013-08-09 ENCOUNTER — Ambulatory Visit (INDEPENDENT_AMBULATORY_CARE_PROVIDER_SITE_OTHER): Payer: Medicare Other | Admitting: Family

## 2013-08-09 ENCOUNTER — Ambulatory Visit (INDEPENDENT_AMBULATORY_CARE_PROVIDER_SITE_OTHER)
Admission: RE | Admit: 2013-08-09 | Discharge: 2013-08-09 | Disposition: A | Discharge status: Home / Self Care | Payer: Medicare Other | Source: Ambulatory Visit | Attending: Surgery | Admitting: Surgery

## 2013-08-09 ENCOUNTER — Encounter: Payer: Self-pay | Admitting: Family

## 2013-08-09 VITALS — BP 116/73 | HR 56 | Resp 14 | Ht 63.0 in | Wt 148.0 lb

## 2013-08-09 DIAGNOSIS — I6529 Occlusion and stenosis of unspecified carotid artery: Secondary | ICD-10-CM

## 2013-08-09 DIAGNOSIS — I6523 Occlusion and stenosis of bilateral carotid arteries: Secondary | ICD-10-CM

## 2013-08-09 DIAGNOSIS — I714 Abdominal aortic aneurysm, without rupture, unspecified: Secondary | ICD-10-CM | POA: Diagnosis not present

## 2013-08-09 NOTE — Progress Notes (Signed)
[facility] [place] HISTORY AND PHYSICAL   MRN : [id number]  History of Present Illness:   Stacy [name] is a 80 y.o. female patient of Dr. Trula Slade. The patient comes back today for followup of her carotid artery occlusive disease and small aortic aneurysm.  She has right sciatic pain, taking acupuncture for this, states this has not helped. She denies claudication symptoms with walking, denies non healing wounds. She denies any history of stroke or TIA. She denies any new back or abdominal pain.  She has an MI in 1997, angioplasty, had a cardiac stent placed in 2000.  Pt Diabetic: Yes, diet controlled Pt smoker: former smoker, quit in 2000  Pt meds include: Statin :Yes Betablocker: Yes ASA: Yes Other anticoagulants/antiplatelets: Plavix  Current Outpatient Prescriptions  Medication Sig Dispense Refill  . aspirin 81 MG tablet Take 1 tablet (81 mg total) by mouth daily.  1 tablet    . bacitracin-neomycin-polymyxin-hydrocortisone (CORTISPORIN) 1 % ointment Apply 1 application topically continuous as needed.      . cholecalciferol (VITAMIN D) 400 UNITS TABS Take 400 Units by mouth daily.       . clopidogrel (PLAVIX) 75 MG tablet TAKE 1 TABLET BY MOUTH DAILY.  30 tablet  5  . Coenzyme Q10 (COQ-10 PO) Take 100 mg by mouth daily. 1 tab po qd      . conjugated estrogens (PREMARIN) vaginal cream 0.5 g once a week. .625 mg once weekly      . CRESTOR 10 MG tablet TAKE 1 TABLET EVERY DAY  30 tablet  11  . dexlansoprazole (DEXILANT) 60 MG capsule Take 1 tablet ( Dexilant DR ) by mouth once daily      . diphenhydrAMINE (SOMINEX) 25 MG tablet Take 25 mg by mouth at bedtime as needed for sleep.      . hydrochlorothiazide (MICROZIDE) 12.5 MG capsule Take 1 capsule (12.5 mg total) by mouth daily.  90 capsule  3  . ibuprofen (ADVIL) 200 MG tablet Take 200 mg by mouth as needed.      . metoprolol succinate (TOPROL-XL) 25 MG 24 hr tablet TAKE 1 TABLET BY MOUTH EVERY DAY  30  tablet  6  . Multiple Vitamins-Minerals (CENTRUM SILVER PO) 1 tab po qd       . nitroGLYCERIN (NITROSTAT) 0.4 MG SL tablet Place 1 tablet (0.4 mg total) under the tongue every 5 (five) minutes as needed.  25 tablet  3  . polyethylene glycol powder (GLYCOLAX/MIRALAX) powder Take 17 g by mouth daily.      . ramipril (ALTACE) 2.5 MG capsule Take 1 capsule (2.5 mg total) by mouth 2 (two) times daily.  60 capsule  11  . sertraline (ZOLOFT) 25 MG tablet Take 25 mg by mouth daily.       . cyanocobalamin 500 MCG tablet Take 500 mcg by mouth 2 (two) times daily.        Marland Kitchen ketoconazole (NIZORAL) 2 % cream Apply topically daily as needed.       No current facility-administered medications for this visit.    Past Medical History  Diagnosis Date  . Atheroembolism   . Peptic ulcer disease   . GERD (gastroesophageal reflux disease)   . Hyperlipidemia   . Renal artery stenosis   . Hemorrhoids   . Osteopenia   . DM (diabetes mellitus)   . CAD (coronary artery disease)   . Chronic kidney disease, stage III (moderate)   . Hypertension   . Myocardial  infarction 03/15/95  . Heart murmur   . Leg pain   . Hiatal hernia   . Arthritis     Cervical DDD C5-6 and C6-7  . Depression   . History of shingles   . DDD (degenerative disc disease)   . Blue toe syndrome   . Hemorrhoid   . Thyroid nodule   . Cancer     skin BCC  Forehead and  SCC Left Lower Leg    Past Surgical History  Procedure Laterality Date  . Cataract extraction    . Tonsillectomy    . Ptca    . Appendectomy    . Hemorrhoid surgery    . Incisional hernia repair    . Basal cell cancer removal      forehead  . Dilation and curettage of uterus    . Endometrial biopsy    . Inguinal hernia repair    . Squamous cell carcinoma excision      left lower leg  . Coronary stent placement  2000  . Eye surgery      Social History History  Substance Use Topics  . Smoking status: Former Smoker -- 1.00 packs/day for 54 years    Types:  Cigarettes    Quit date: 10/05/1998  . Smokeless tobacco: Never Used  . Alcohol Use: No    Family History Family History  Problem Relation Age of Onset  . Diabetes Father   . Prostate cancer Father     meta to colon  . Cancer Father     Prostate  . Parkinsonism Mother   . Stroke Paternal Uncle    Allergies  Allergen Reactions  . Tranilast Hives    Investigational medication  . Iohexol      Desc: HIVES- 13 HR PRE-MEDS ARE REQUIRED-ASM- 03/21/05,SULFA,ADHESIVE TAPE   . Latex     Adhesive tape and EKG adhesive leads to rash  . Sulfonamide Derivatives     REACTION: hives     REVIEW OF SYSTEMS: See HPI for pertinent positives and negatives.  Physical Examination Filed Vitals:   08/09/13 1058 08/09/13 1104  BP: 114/64 116/73  Pulse: 57 56  Resp:  14  Height:  5' 3"  (1.6 m)  Weight:  148 lb (67.132 kg)  SpO2:  96%   Body mass index is 26.22 kg/(m^2).  General:  WDWN in NAD Gait: Normal HENT: WNL Eyes: Pupils equal Pulmonary: normal non-labored breathing , without Rales, rhonchi,  wheezing Cardiac: RRR, no murmurs detected  Abdomen: soft, NT, no masses Skin: no rashes, ulcers noted;  no Gangrene , no cellulitis; no open wounds;   VASCULAR EXAM  Carotid Bruits Left Right   Negative Negative                             VASCULAR EXAM: Extremities without ischemic changes  without Gangrene; without open wounds.  LE Pulses LEFT RIGHT       FEMORAL  2+ palpable  1+ palpable        POPLITEAL  not palpable   not palpable       POSTERIOR TIBIAL  not palpable   not palpable        DORSALIS PEDIS      ANTERIOR TIBIAL 1+ palpable  1+ palpable     Musculoskeletal: no muscle wasting or atrophy; no edema  Neurologic: A&O X 3; Appropriate Affect ;  SENSATION: normal; MOTOR FUNCTION: 5/5 Symmetric, CN 2-12 intact Speech is fluent/normal   Non-Invasive  Vascular Imaging (08/09/2013):  ABDOMINAL AORTA DUPLEX EVALUATION    INDICATION: Abdominal aortic aneurysm    PREVIOUS INTERVENTION(S):     DUPLEX EXAM:     LOCATION DIAMETER AP (cm) DIAMETER TRANSVERSE (cm) VELOCITIES (cm/sec)  Aorta Proximal 2.3 2.4 48  Aorta Mid 1.6 1.7 51  Aorta Distal 1.3 1.4 68  Right Common Iliac Artery 1.1 1.0 142  Left Common Iliac Artery 0.9 1.0 154    Previous max aortic diameter:  2.2cm Date: 11/27/09     ADDITIONAL FINDINGS:   Decreased visualization of the abdominal vasculature due to overlying bowel gas.   Diffuse, non-hemodynamically significant atherosclerosis noted in the abdominal aorta.      IMPRESSION: No aneurysmal dilatation or hemodynamically significant stenosis of the abdominal aorta or proximal common iliac arteries.     Compared to the previous exam:  No significant change in the abdominal aorta diameters when compared to the previous exam.       CEREBROVASCULAR DUPLEX EVALUATION    INDICATION: Carotid disease    PREVIOUS INTERVENTION(S):     DUPLEX EXAM:     RIGHT  LEFT  Peak Systolic Velocities (cm/s) End Diastolic Velocities (cm/s) Plaque LOCATION Peak Systolic Velocities (cm/s) End Diastolic Velocities (cm/s) Plaque  77 22  CCA PROXIMAL 85 11   60 15  CCA MID 86 17   119 30 CP CCA DISTAL 74 16   164 17 HT ECA 80 4   265 76 CP ICA PROXIMAL 64 17   80 25  ICA MID 75 24   75 25  ICA DISTAL 81 28     Not Calculated ICA / CCA Ratio (PSV) 0.86  Antegrade Vertebral Flow Antegrade  902 Brachial Systolic Pressure (mmHg) 111  Multiphasic (subclavian artery) Brachial Artery Waveforms Multiphasic (subclavian artery)    Plaque Morphology:  HM = Homogeneous, HT = Heterogeneous, CP = Calcific Plaque, SP = Smooth Plaque, IP = Irregular Plaque     ADDITIONAL FINDINGS:   No significant stenosis of the left external or bilateral common carotid arteries.   The right external carotid artery stenosis.    IMPRESSION: Doppler  velocities suggest a 60-79% stenosis of the right proximal internal carotid artery and no stenosis of the left internal carotid artery.    Compared to the previous exam:  Significant increase in velocity of the right proximal internal carotid artery noted when compared to the previous exam on 06/08/12 with the left carotid arteries remaining stable.      ASSESSMENT:  Stacy [name] is a 80 y.o. female who returns for followup of her carotid artery occlusive disease and small aortic aneurysm.  She has right sciatic pain, taking acupuncture for this, states this has not helped. She denies claudication symptoms with walking, denies non healing wounds. She denies any history of stroke or TIA. She denies any new back or abdominal pain.  Doppler velocities suggest a 60-79% stenosis of the right proximal internal carotid artery and no stenosis of the left internal carotid artery. Significant increase in velocity of the right proximal internal carotid artery noted when compared to the previous exam on 06/08/12 with the left carotid arteries remaining stable.   No aneurysmal dilatation or hemodynamically significant stenosis of the abdominal aorta or proximal common iliac arteries. No significant change in the abdominal aorta diameters when compared to the previous exam.  Diminished pulses in feet with no evidence of ischemia, no claudication symptoms, but will check ABI's on her returns in 6 months.   PLAN:   Based on today's exam and non-invasive vascular lab results, the patient will follow up in 6 months with the following tests carotid Duplex and ABI's, and in 1 year for AAA Duplex. I discussed in depth with the patient the nature of atherosclerosis, and emphasized the importance of maximal medical management including strict control of blood pressure, blood glucose, and lipid levels, obtaining regular exercise, and cessation of smoking.  The patient is aware that without maximal medical  management the underlying atherosclerotic disease process will progress, limiting the benefit of any interventions. The patient was given information about stroke prevention and what symptoms should prompt the patient to seek immediate medical care. The patient was given information about AAA including signs, symptoms, treatment,  what symptoms should prompt the patient to seek immediate medical care, and how to minimize the risk of enlargement and rupture of aneurysms.  Thank you for allowing [place] to participate in this patient's care.  Clemon Chambers, RN, MSN, FNP-C [facility] Office: [phone]  Clinic MD: Trula Slade 08/09/2013 11:13 AM

## 2013-08-09 NOTE — Patient Instructions (Signed)
Stroke Prevention Some medical conditions and behaviors are associated with an increased chance of having a stroke. You may prevent a stroke by making healthy choices and managing medical conditions. HOW CAN I REDUCE MY RISK OF HAVING A STROKE?   Stay physically active. Get at least 30 minutes of activity on most or all days.  Do not smoke. It may also be helpful to avoid exposure to secondhand smoke.  Limit alcohol use. Moderate alcohol use is considered to be:  No more than 2 drinks per day for men.  No more than 1 drink per day for nonpregnant women.  Eat healthy foods. This involves  Eating 5 or more servings of fruits and vegetables a day.  Following a diet that addresses high blood pressure (hypertension), high cholesterol, diabetes, or obesity.  Manage your cholesterol levels.  A diet low in saturated fat, trans fat, and cholesterol and high in fiber may control cholesterol levels.  Take any prescribed medicines to control cholesterol as directed by your health care provider.  Manage your diabetes.  A controlled-carbohydrate, controlled-sugar diet is recommended to manage diabetes.  Take any prescribed medicines to control diabetes as directed by your health care provider.  Control your hypertension.  A low-salt (sodium), low-saturated fat, low-trans fat, and low-cholesterol diet is recommended to manage hypertension.  Take any prescribed medicines to control hypertension as directed by your health care provider.  Maintain a healthy weight.  A reduced-calorie, low-sodium, low-saturated fat, low-trans fat, low-cholesterol diet is recommended to manage weight.  Stop drug abuse.  Avoid taking birth control pills.  Talk to your health care provider about the risks of taking birth control pills if you are over 35 years old, smoke, get migraines, or have ever had a blood clot.  Get evaluated for sleep disorders (sleep apnea).  Talk to your health care provider about  getting a sleep evaluation if you snore a lot or have excessive sleepiness.  Take medicines as directed by your health care provider.  For some people, aspirin or blood thinners (anticoagulants) are helpful in reducing the risk of forming abnormal blood clots that can lead to stroke. If you have the irregular heart rhythm of atrial fibrillation, you should be on a blood thinner unless there is a good reason you cannot take them.  Understand all your medicine instructions.  Make sure that other other conditions (such as anemia or atherosclerosis) are addressed. SEEK IMMEDIATE MEDICAL CARE IF:   You have sudden weakness or numbness of the face, arm, or leg, especially on one side of the body.  Your face or eyelid droops to one side.  You have sudden confusion.  You have trouble speaking (aphasia) or understanding.  You have sudden trouble seeing in one or both eyes.  You have sudden trouble walking.  You have dizziness.  You have a loss of balance or coordination.  You have a sudden, severe headache with no known cause.  You have new chest pain or an irregular heartbeat. Any of these symptoms may represent a serious problem that is an emergency. Do not wait to see if the symptoms will go away. Get medical help at once. Call your local emergency services  (911 in [place] Do not drive yourself to the hospital. Document Released: 03/07/2004 Document Revised: 11/18/2012 Document Reviewed: 07/31/2012 Exit[employer] Patient Information 2015 [employer]. This information is not intended to replace advice given to you by your health care provider. Make sure you discuss any questions you have with your health   care provider.  

## 2013-08-16 ENCOUNTER — Ambulatory Visit: Payer: Self-pay | Admitting: Surgery

## 2013-08-16 ENCOUNTER — Other Ambulatory Visit: Payer: Self-pay

## 2013-08-19 NOTE — Telephone Encounter (Signed)
Left message for dtr, made aware no available appts until oct and that schedule is not available yet. We will give them a call once schedule is open. She is to call with questions.

## 2013-08-24 DIAGNOSIS — I781 Nevus, non-neoplastic: Secondary | ICD-10-CM | POA: Diagnosis not present

## 2013-08-24 DIAGNOSIS — D239 Other benign neoplasm of skin, unspecified: Secondary | ICD-10-CM | POA: Diagnosis not present

## 2013-08-24 DIAGNOSIS — L821 Other seborrheic keratosis: Secondary | ICD-10-CM | POA: Diagnosis not present

## 2013-08-24 DIAGNOSIS — Z85828 Personal history of other malignant neoplasm of skin: Secondary | ICD-10-CM | POA: Diagnosis not present

## 2013-08-24 DIAGNOSIS — D234 Other benign neoplasm of skin of scalp and neck: Secondary | ICD-10-CM | POA: Diagnosis not present

## 2013-08-24 DIAGNOSIS — L570 Actinic keratosis: Secondary | ICD-10-CM | POA: Diagnosis not present

## 2013-08-26 ENCOUNTER — Other Ambulatory Visit: Payer: Self-pay | Admitting: Cardiovascular Disease

## 2013-09-07 DIAGNOSIS — E1129 Type 2 diabetes mellitus with other diabetic kidney complication: Secondary | ICD-10-CM | POA: Diagnosis not present

## 2013-09-07 DIAGNOSIS — E119 Type 2 diabetes mellitus without complications: Secondary | ICD-10-CM | POA: Diagnosis not present

## 2013-09-07 DIAGNOSIS — E785 Hyperlipidemia, unspecified: Secondary | ICD-10-CM | POA: Diagnosis not present

## 2013-09-09 DIAGNOSIS — I10 Essential (primary) hypertension: Secondary | ICD-10-CM | POA: Diagnosis not present

## 2013-09-09 DIAGNOSIS — N183 Chronic kidney disease, stage 3 unspecified: Secondary | ICD-10-CM | POA: Diagnosis not present

## 2013-09-09 DIAGNOSIS — E1129 Type 2 diabetes mellitus with other diabetic kidney complication: Secondary | ICD-10-CM | POA: Diagnosis not present

## 2013-09-09 DIAGNOSIS — E785 Hyperlipidemia, unspecified: Secondary | ICD-10-CM | POA: Diagnosis not present

## 2013-09-09 DIAGNOSIS — Z6826 Body mass index (BMI) 26.0-26.9, adult: Secondary | ICD-10-CM | POA: Diagnosis not present

## 2013-09-10 ENCOUNTER — Other Ambulatory Visit: Payer: Self-pay | Admitting: *Deleted

## 2013-09-10 MED ORDER — HYDROCHLOROTHIAZIDE 12.5 MG PO CAPS: 12.5000 mg | ORAL_CAPSULE | Freq: Every day | ORAL | Status: DC

## 2013-09-16 DIAGNOSIS — N362 Urethral caruncle: Secondary | ICD-10-CM | POA: Diagnosis not present

## 2013-09-16 DIAGNOSIS — N318 Other neuromuscular dysfunction of bladder: Secondary | ICD-10-CM | POA: Diagnosis not present

## 2013-09-22 DIAGNOSIS — Z13820 Encounter for screening for osteoporosis: Secondary | ICD-10-CM | POA: Diagnosis not present

## 2013-09-22 DIAGNOSIS — E119 Type 2 diabetes mellitus without complications: Secondary | ICD-10-CM | POA: Diagnosis not present

## 2013-09-22 DIAGNOSIS — Z23 Encounter for immunization: Secondary | ICD-10-CM | POA: Diagnosis not present

## 2013-09-22 DIAGNOSIS — E663 Overweight: Secondary | ICD-10-CM | POA: Diagnosis not present

## 2013-09-22 DIAGNOSIS — Z6826 Body mass index (BMI) 26.0-26.9, adult: Secondary | ICD-10-CM | POA: Diagnosis not present

## 2013-09-22 DIAGNOSIS — E042 Nontoxic multinodular goiter: Secondary | ICD-10-CM | POA: Diagnosis not present

## 2013-09-27 ENCOUNTER — Ambulatory Visit (HOSPITAL_COMMUNITY): Payer: Self-pay | Admitting: Dentistry

## 2013-09-27 ENCOUNTER — Encounter (HOSPITAL_COMMUNITY): Payer: Self-pay | Admitting: Dentistry

## 2013-09-27 ENCOUNTER — Encounter (INDEPENDENT_AMBULATORY_CARE_PROVIDER_SITE_OTHER): Payer: Self-pay

## 2013-09-27 VITALS — BP 111/44 | HR 52 | Temp 97.7°F

## 2013-09-27 DIAGNOSIS — K036 Deposits [accretions] on teeth: Secondary | ICD-10-CM

## 2013-09-27 DIAGNOSIS — K056 Periodontal disease, unspecified: Secondary | ICD-10-CM

## 2013-09-27 DIAGNOSIS — K089 Disorder of teeth and supporting structures, unspecified: Secondary | ICD-10-CM

## 2013-09-27 DIAGNOSIS — K069 Disorder of gingiva and edentulous alveolar ridge, unspecified: Secondary | ICD-10-CM

## 2013-09-27 NOTE — Patient Instructions (Signed)
Brush after meals and at bedtime. Floss at bedtime. Return to clinic as scheduled. Call if problems arise before then. Lenn Cal, DDS

## 2013-09-27 NOTE — Progress Notes (Signed)
09/27/2013  Patient:            Stacy [name] Date of Birth:  06-01-1926 MRN:                [id number]  BP 111/44  Pulse 52  Temp(Src) 97.7 F (36.5 C) (Oral)   Stacy [name] is an 80 year old female that presents for periodic oral exam and dental cleaning.  Premedication: None required.  Medical Hx Update:  Past Medical History  Diagnosis Date  . Atheroembolism   . Peptic ulcer disease   . GERD (gastroesophageal reflux disease)   . Hyperlipidemia   . Renal artery stenosis   . Hemorrhoids   . Osteopenia   . DM (diabetes mellitus)   . CAD (coronary artery disease)   . Chronic kidney disease, stage III (moderate)   . Hypertension   . Myocardial infarction 03/15/95  . Heart murmur   . Leg pain   . Hiatal hernia   . Arthritis     Cervical DDD C5-6 and C6-7  . Depression   . History of shingles   . DDD (degenerative disc disease)   . Blue toe syndrome   . Hemorrhoid   . Thyroid nodule   . Cancer     skin BCC  Forehead and  SCC Left Lower Leg   Past Surgical History  Procedure Laterality Date  . Cataract extraction    . Tonsillectomy    . Ptca    . Appendectomy    . Hemorrhoid surgery    . Incisional hernia repair    . Basal cell cancer removal      forehead  . Dilation and curettage of uterus    . Endometrial biopsy    . Inguinal hernia repair    . Squamous cell carcinoma excision      left lower leg  . Coronary stent placement  2000  . Eye surgery     . ALLERGIES/ADVERSE DRUG REACTIONS: Allergies  Allergen Reactions  . Tranilast Hives    Investigational medication  . Iohexol      Desc: HIVES- 13 HR PRE-MEDS ARE REQUIRED-ASM- 03/21/05,SULFA,ADHESIVE TAPE   . Latex     Adhesive tape and EKG adhesive leads to rash  . Sulfonamide Derivatives     REACTION: hives   MEDICATIONS: Current Outpatient Prescriptions  Medication Sig Dispense Refill  . aspirin 81 MG tablet Take 1 tablet (81 mg total) by mouth daily.  1 tablet    .  bacitracin-neomycin-polymyxin-hydrocortisone (CORTISPORIN) 1 % ointment Apply 1 application topically continuous as needed.      . cholecalciferol (VITAMIN D) 400 UNITS TABS Take 400 Units by mouth daily.       . clopidogrel (PLAVIX) 75 MG tablet TAKE 1 TABLET BY MOUTH DAILY.  30 tablet  5  . Coenzyme Q10 (COQ-10 PO) Take 100 mg by mouth daily. 1 tab po qd      . conjugated estrogens (PREMARIN) vaginal cream 0.5 g once a week. .625 mg once weekly      . CRESTOR 10 MG tablet TAKE 1 TABLET EVERY DAY  30 tablet  11  . cyanocobalamin 500 MCG tablet Take 500 mcg by mouth 2 (two) times daily.        Marland Kitchen dexlansoprazole (DEXILANT) 60 MG capsule Take 1 tablet ( Dexilant DR ) by mouth once daily      . diphenhydrAMINE (SOMINEX) 25 MG tablet Take 25 mg by mouth at bedtime as needed for sleep.      Marland Kitchen  hydrochlorothiazide (MICROZIDE) 12.5 MG capsule Take 1 capsule (12.5 mg total) by mouth daily.  90 capsule  0  . ibuprofen (ADVIL) 200 MG tablet Take 200 mg by mouth as needed.      Marland Kitchen ketoconazole (NIZORAL) 2 % cream Apply topically daily as needed.      . metoprolol succinate (TOPROL-XL) 25 MG 24 hr tablet TAKE 1 TABLET BY MOUTH EVERY DAY  30 tablet  6  . Multiple Vitamins-Minerals (CENTRUM SILVER PO) 1 tab po qd       . nitroGLYCERIN (NITROSTAT) 0.4 MG SL tablet Place 1 tablet (0.4 mg total) under the tongue every 5 (five) minutes as needed.  25 tablet  3  . polyethylene glycol powder (GLYCOLAX/MIRALAX) powder Take 17 g by mouth daily.      . ramipril (ALTACE) 2.5 MG capsule Take 1 capsule (2.5 mg total) by mouth 2 (two) times daily.  60 capsule  11  . sertraline (ZOLOFT) 25 MG tablet Take 25 mg by mouth daily.        No current facility-administered medications for this visit.    C/C: Patient presents for periodic oral examination and dental cleaning.  HPI: Stacy [name]  is an 80 year old female with multiple medical problems that his now seen for periodic oral examination and periodontal recall. She  was last seen in July of 2014 for an exam and cleaning. Patient denies having any acute dental problems at this time.      DENTAL EXAM: General:  Patient is a well-developed, well-nourished female in no acute distress. Vitals: BP 111/44  Pulse 52  Temp(Src) 97.7 F (36.5 C) (Oral) Extraoral Exam: No palpable lymphadenopathy.  No acute TMJ Symptoms. Intraoral  Exam:  Normal Saliva. No Soft tissue lesions noted. Dentition:  As before. Periodontal: Patient with minimal plaque accumulations, selective areas gingival recession and no significant tooth mobility. Patient with incipient to moderate bone loss. Caries: No caries noted. Bitewings were reviewed with some less than perfect margins of the amalgam restorations.  Endodontic: No history of pulpitits symptoms.  C&B: Stable. Multiple crown and bridge restorations that are acceptable to the patient from functionality standpoint. Patient understands that they are less than ideal from aesthetic standpoint. Patient also is not interested in referral for crown restorations at this time. Prosthodontic: None Occlusion: Stable Radiographic Interpretation: The four bitewings reveal multiple amalgam and crown restorations. Some of the margins are less than ideal, especially with the amalgam restorations. No obvious dental caries are noted.   Procedures: 1. Periodic oral examination 2. Prophylaxis with sonic scaler and hand curettes. [place] Fluoride varnish. 3. Four Bitewings  Patient tolerated procedure well.  Plan:  1. Return to clinic in 6 months for an exam and cleaning. 2. Call if problems arise before then.   Lenn Cal, DDS

## 2013-10-06 DIAGNOSIS — M949 Disorder of cartilage, unspecified: Secondary | ICD-10-CM | POA: Diagnosis not present

## 2013-10-06 DIAGNOSIS — M899 Disorder of bone, unspecified: Secondary | ICD-10-CM | POA: Diagnosis not present

## 2013-10-26 ENCOUNTER — Encounter: Payer: Self-pay | Admitting: Cardiovascular Disease

## 2013-10-26 ENCOUNTER — Ambulatory Visit (INDEPENDENT_AMBULATORY_CARE_PROVIDER_SITE_OTHER): Payer: Medicare Other | Admitting: Cardiovascular Disease

## 2013-10-26 VITALS — BP 115/58 | HR 60 | Ht 63.0 in | Wt 147.0 lb

## 2013-10-26 DIAGNOSIS — I701 Atherosclerosis of renal artery: Secondary | ICD-10-CM

## 2013-10-26 DIAGNOSIS — I2510 Atherosclerotic heart disease of native coronary artery without angina pectoris: Secondary | ICD-10-CM | POA: Diagnosis not present

## 2013-10-26 DIAGNOSIS — E785 Hyperlipidemia, unspecified: Secondary | ICD-10-CM | POA: Diagnosis not present

## 2013-10-26 DIAGNOSIS — I10 Essential (primary) hypertension: Secondary | ICD-10-CM | POA: Diagnosis not present

## 2013-10-26 MED ORDER — HYDROCHLOROTHIAZIDE 12.5 MG PO CAPS: 12.5000 mg | ORAL_CAPSULE | Freq: Every day | ORAL | Status: DC

## 2013-10-26 MED ORDER — ROSUVASTATIN CALCIUM 10 MG PO TABS: ORAL_TABLET | ORAL | Status: DC

## 2013-10-26 MED ORDER — CLOPIDOGREL BISULFATE 75 MG PO TABS: ORAL_TABLET | ORAL | Status: DC

## 2013-10-26 MED ORDER — NITROGLYCERIN 0.4 MG SL SUBL: 0.4000 mg | SUBLINGUAL_TABLET | SUBLINGUAL | Status: DC | PRN

## 2013-10-26 NOTE — Progress Notes (Signed)
HPI:  80 year old woman presenting for followup evaluation. She is followed for coronary artery disease and initially presented in 1997 with an anterior MI treated with balloon angioplasty of the LAD. She subsequently underwent stenting of the right coronary artery in 2000. Because of recurrent symptoms, she underwent repeat heart catheterization in 2008 and this demonstrated patency of her previous angioplasty and stent sites. The patient continues to do very well. 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 She denies any symptoms of chest pain, shortness of breath, edema, or heart palpitations. Her medications have not changed. She brings in recent lab work from 09/08/2013. This shows a normal hemoglobin of 14.2, platelets 222,000, creatinine 1.02, BUN 13, potassium 4.0, cholesterol 136, triglycerides 125, HDL cholesterol 50, and LDL cholesterol 61.   Outpatient Encounter Prescriptions as of 10/26/2013  Medication Sig  . aspirin 81 MG tablet Take 1 tablet (81 mg total) by mouth daily.  . cholecalciferol (VITAMIN D) 400 UNITS TABS Take 400 Units by mouth daily.   . clopidogrel (PLAVIX) 75 MG tablet TAKE 1 TABLET BY MOUTH DAILY.  Marland Kitchen Coenzyme Q10 (COQ-10 PO) Take 100 mg by mouth daily. 1 tab po qd  . conjugated estrogens (PREMARIN) vaginal cream 0.5 g once a week. .625 mg once weekly  . CRESTOR 10 MG tablet TAKE 1 TABLET EVERY DAY  . dexlansoprazole (DEXILANT) 60 MG capsule Take 1 tablet ( Dexilant DR ) by mouth once daily  . diphenhydrAMINE (SOMINEX) 25 MG tablet Take 25 mg by mouth at bedtime as needed for sleep.  . hydrochlorothiazide (MICROZIDE) 12.5 MG capsule Take 1 capsule (12.5 mg total) by mouth daily.  Marland Kitchen ibuprofen (ADVIL) 200 MG tablet Take 200 mg by mouth as needed.  . Melatonin 5 MG CAPS Take 1 capsule by mouth at bedtime.  . metoprolol succinate (TOPROL-XL) 25 MG 24 hr tablet TAKE 1 TABLET BY MOUTH EVERY DAY  . Multiple Vitamins-Minerals (CENTRUM SILVER PO) 1 tab po qd    . nitroGLYCERIN (NITROSTAT) 0.4 MG SL tablet Place 1 tablet (0.4 mg total) under the tongue every 5 (five) minutes as needed.  . polyethylene glycol powder (GLYCOLAX/MIRALAX) powder Take 17 g by mouth daily.  . ramipril (ALTACE) 2.5 MG capsule Take 1 capsule (2.5 mg total) by mouth 2 (two) times daily.  . sertraline (ZOLOFT) 25 MG tablet Take 25 mg by mouth daily.   . [DISCONTINUED] bacitracin-neomycin-polymyxin-hydrocortisone (CORTISPORIN) 1 % ointment Apply 1 application topically continuous as needed.  . [DISCONTINUED] cyanocobalamin 500 MCG tablet Take 500 mcg by mouth 2 (two) times daily.    . [DISCONTINUED] ketoconazole (NIZORAL) 2 % cream Apply topically daily as needed.    Allergies  Allergen Reactions  . Tranilast Hives    Investigational medication  . Iohexol      Desc: HIVES- 13 HR PRE-MEDS ARE REQUIRED-ASM- 03/21/05,SULFA,ADHESIVE TAPE   . Latex     Adhesive tape and EKG adhesive leads to rash  . Sulfonamide Derivatives     REACTION: hives    Past Medical History  Diagnosis Date  . Atheroembolism   . Peptic ulcer disease   . GERD (gastroesophageal reflux disease)   . Hyperlipidemia   . Renal artery stenosis   . Hemorrhoids   . Osteopenia   . DM (diabetes mellitus)   . CAD (coronary artery disease)   . Chronic kidney disease, stage III (moderate)   . Hypertension   . Myocardial infarction 03/15/95  . Heart murmur   . Leg pain   .  Hiatal hernia   . Arthritis     Cervical DDD C5-6 and C6-7  . Depression   . History of shingles   . DDD (degenerative disc disease)   . Blue toe syndrome   . Hemorrhoid   . Thyroid nodule   . Cancer     skin BCC  Forehead and  SCC Left Lower Leg    ROS: Negative except as per HPI  BP 115/58  Pulse 60  Ht 5' 3"  (1.6 m)  Wt 147 lb (66.679 kg)  BMI 26.05 kg/m2  PHYSICAL EXAM: Pt is alert and oriented, pleasant elderly woman in NAD HEENT: normal Neck: JVP - normal, carotids 2+= without bruits Lungs: CTA bilaterally CV:  RRR without murmur or gallop Abd: soft, NT, Positive BS, no hepatomegaly Ext: no C/C/E, distal pulses intact and equal Skin: warm/dry no rash  EKG:  Normal sinus rhythm 60 beats per minute, anterolateral MI age undetermined, inferior MI age undetermined. No change from previous tracing.  ASSESSMENT AND PLAN: 1. CAD, native vessel. No anginal sx's. EKG unchanged. Continue ASA and plavix. We discussed considerations regarding long-term dual antiplatelet therapy. The patient has had multiple PCI procedures and has been remarkably stable on aspirin and Plavix for many years. She's had no bleeding problems. Since her current therapy is clinically effective and she is experiencing no side effects, we are going to continue the same and I will see her back in one year for followup evaluation.   2. HTN - continue HCTZ and ramipril as blood pressure is well controlled. Lab work reviewed.   3. Hyperlipidemia - lipids reviewed and pt will continue crestor. Managed by her PCP.   Sherren Mocha MD 10/26/2013 11:36 AM

## 2013-10-26 NOTE — Patient Instructions (Addendum)
Your physician wants you to follow-up in: 6 MONTHS with Dr Cooper.  You will receive a reminder letter in the mail two months in advance. If you don't receive a letter, please call our office to schedule the follow-up appointment.  Your physician recommends that you continue on your current medications as directed. Please refer to the Current Medication list given to you today.  

## 2013-11-08 ENCOUNTER — Ambulatory Visit: Payer: Self-pay | Admitting: Cardiovascular Disease

## 2013-11-11 ENCOUNTER — Encounter: Payer: Self-pay | Admitting: Gastroenterology

## 2013-12-14 ENCOUNTER — Other Ambulatory Visit: Payer: Self-pay | Admitting: Cardiovascular Disease

## 2013-12-17 ENCOUNTER — Encounter (HOSPITAL_COMMUNITY): Payer: Self-pay | Admitting: Dentistry

## 2013-12-27 DIAGNOSIS — Z85828 Personal history of other malignant neoplasm of skin: Secondary | ICD-10-CM | POA: Diagnosis not present

## 2013-12-27 DIAGNOSIS — L820 Inflamed seborrheic keratosis: Secondary | ICD-10-CM | POA: Diagnosis not present

## 2013-12-27 DIAGNOSIS — L304 Erythema intertrigo: Secondary | ICD-10-CM | POA: Diagnosis not present

## 2013-12-28 ENCOUNTER — Encounter (INDEPENDENT_AMBULATORY_CARE_PROVIDER_SITE_OTHER): Payer: Self-pay

## 2013-12-28 ENCOUNTER — Ambulatory Visit (HOSPITAL_COMMUNITY): Payer: Self-pay | Admitting: Dentistry

## 2013-12-28 ENCOUNTER — Encounter (HOSPITAL_COMMUNITY): Payer: Self-pay | Admitting: Dentistry

## 2013-12-28 VITALS — BP 108/55 | HR 54 | Temp 97.4°F

## 2013-12-28 DIAGNOSIS — I2510 Atherosclerotic heart disease of native coronary artery without angina pectoris: Secondary | ICD-10-CM

## 2013-12-28 DIAGNOSIS — K029 Dental caries, unspecified: Secondary | ICD-10-CM

## 2013-12-28 DIAGNOSIS — S025XXA Fracture of tooth (traumatic), initial encounter for closed fracture: Secondary | ICD-10-CM

## 2013-12-28 NOTE — Progress Notes (Signed)
12/28/2013  Patient:            Stacy [name] Date of Birth:  [birth year] MRN:                [id number]   BP 108/55 mmHg  Pulse 54  Temp(Src) 97.4 F (36.3 C) (Oral)   Stacy [name] is an 80 year old female that presents for evaluation of a "chipped tooth". Patient relates breaking the corner of the tooth off 1-2 weeks ago. Patient denies having any sensitivity associated with this fractured tooth. Patient has been eating softer foods and avoiding this area while chewing.  Exam: Evaluation of the mandibular anterior dentition reveals tooth #24 has a fractured distal incisal edge. I discussed the risks, benefits, complications of various treatment options with the patient. Patient agrees to a dental resin restoration today. No local anesthetic was needed. [id number] DIL/Resin AE,Primer,DBA, Tetric Ceram A2.0. Adjust occlusion and polish. Patient accepts results.  Patient to return to clinic for an exam and cleaning as scheduled. Patient to call if problems arise before then.  Lenn Cal, DDS

## 2013-12-28 NOTE — Patient Instructions (Signed)
Return to clinic as scheduled for an exam and cleaning. Call if problems arise before then. Lenn Cal, DDS

## 2014-01-25 DIAGNOSIS — N138 Other obstructive and reflux uropathy: Secondary | ICD-10-CM | POA: Diagnosis not present

## 2014-01-25 DIAGNOSIS — I10 Essential (primary) hypertension: Secondary | ICD-10-CM | POA: Diagnosis not present

## 2014-01-25 DIAGNOSIS — E785 Hyperlipidemia, unspecified: Secondary | ICD-10-CM | POA: Diagnosis not present

## 2014-01-25 DIAGNOSIS — E119 Type 2 diabetes mellitus without complications: Secondary | ICD-10-CM | POA: Diagnosis not present

## 2014-01-25 DIAGNOSIS — N183 Chronic kidney disease, stage 3 (moderate): Secondary | ICD-10-CM | POA: Diagnosis not present

## 2014-01-25 DIAGNOSIS — E042 Nontoxic multinodular goiter: Secondary | ICD-10-CM | POA: Diagnosis not present

## 2014-01-27 ENCOUNTER — Other Ambulatory Visit (HOSPITAL_COMMUNITY): Payer: Self-pay | Admitting: Legal Medicine

## 2014-01-27 DIAGNOSIS — E041 Nontoxic single thyroid nodule: Secondary | ICD-10-CM

## 2014-01-27 DIAGNOSIS — I701 Atherosclerosis of renal artery: Secondary | ICD-10-CM | POA: Diagnosis not present

## 2014-01-27 DIAGNOSIS — Z6826 Body mass index (BMI) 26.0-26.9, adult: Secondary | ICD-10-CM | POA: Diagnosis not present

## 2014-01-27 DIAGNOSIS — I10 Essential (primary) hypertension: Secondary | ICD-10-CM | POA: Diagnosis not present

## 2014-01-27 DIAGNOSIS — I2510 Atherosclerotic heart disease of native coronary artery without angina pectoris: Secondary | ICD-10-CM | POA: Diagnosis not present

## 2014-01-27 DIAGNOSIS — E786 Lipoprotein deficiency: Secondary | ICD-10-CM | POA: Diagnosis not present

## 2014-01-27 DIAGNOSIS — E1129 Type 2 diabetes mellitus with other diabetic kidney complication: Secondary | ICD-10-CM | POA: Diagnosis not present

## 2014-02-08 ENCOUNTER — Encounter: Payer: Self-pay | Admitting: Family

## 2014-02-12 ENCOUNTER — Other Ambulatory Visit: Payer: Self-pay | Admitting: Cardiovascular Disease

## 2014-02-14 ENCOUNTER — Ambulatory Visit (INDEPENDENT_AMBULATORY_CARE_PROVIDER_SITE_OTHER)
Admission: RE | Admit: 2014-02-14 | Discharge: 2014-02-14 | Disposition: A | Discharge status: Home / Self Care | Payer: Medicare Other | Source: Ambulatory Visit | Attending: Family | Admitting: Family

## 2014-02-14 ENCOUNTER — Ambulatory Visit (HOSPITAL_COMMUNITY)
Admission: RE | Admit: 2014-02-14 | Discharge: 2014-02-14 | Disposition: A | Discharge status: Home / Self Care | Payer: Medicare Other | Source: Ambulatory Visit | Attending: Family | Admitting: Family

## 2014-02-14 ENCOUNTER — Encounter: Payer: Self-pay | Admitting: Family

## 2014-02-14 ENCOUNTER — Other Ambulatory Visit: Payer: Self-pay | Admitting: Family

## 2014-02-14 ENCOUNTER — Ambulatory Visit (INDEPENDENT_AMBULATORY_CARE_PROVIDER_SITE_OTHER): Payer: Medicare Other | Admitting: Family

## 2014-02-14 VITALS — BP 119/74 | HR 56 | Resp 14 | Ht 63.0 in | Wt 152.0 lb

## 2014-02-14 DIAGNOSIS — I6523 Occlusion and stenosis of bilateral carotid arteries: Secondary | ICD-10-CM | POA: Insufficient documentation

## 2014-02-14 DIAGNOSIS — I714 Abdominal aortic aneurysm, without rupture, unspecified: Secondary | ICD-10-CM

## 2014-02-14 DIAGNOSIS — R0989 Other specified symptoms and signs involving the circulatory and respiratory systems: Secondary | ICD-10-CM

## 2014-02-14 NOTE — Progress Notes (Signed)
Established Carotid Patient   History of Present Illness  Stacy [name] is a 81 y.o. female  patient of Dr. Trula Slade. The patient comes back today for followup of her carotid artery occlusive disease, history of small aortic aneurysm, and ABI's for non palpable pedal pulses with no claudication symptoms, no ischemic changes.  She has right sciatic pain, taking acupuncture for this, states this has not helped. She denies claudication symptoms with walking, denies non healing wounds. She denies any history of stroke or TIA. She denies any new back or abdominal pain.  She had an MI in 1997, angioplasty, had a cardiac stent placed in 2000. She walks on treadmill 3 days/week for 10-20 minutes.  Pt Diabetic: Yes, diet controlled Pt smoker: former smoker, quit in 2000  Pt meds include: Statin :Yes Betablocker: Yes ASA: Yes Other anticoagulants/antiplatelets: Plavix  Past Medical History  Diagnosis Date  . Atheroembolism   . Peptic ulcer disease   . GERD (gastroesophageal reflux disease)   . Hyperlipidemia   . Renal artery stenosis   . Hemorrhoids   . Osteopenia   . DM (diabetes mellitus)   . CAD (coronary artery disease)   . Chronic kidney disease, stage III (moderate)   . Hypertension   . Myocardial infarction 03/15/95  . Heart murmur   . Leg pain   . Hiatal hernia   . Arthritis     Cervical DDD C5-6 and C6-7  . Depression   . History of shingles   . DDD (degenerative disc disease)   . Blue toe syndrome   . Hemorrhoid   . Thyroid nodule   . Cancer     skin BCC  Forehead and  SCC Left Lower Leg    Social History History  Substance Use Topics  . Smoking status: Former Smoker -- 1.00 packs/day for 54 years    Types: Cigarettes    Quit date: 10/05/1998  . Smokeless tobacco: Never Used  . Alcohol Use: No    Family History Family History  Problem Relation Age of Onset  . Diabetes Father   . Prostate cancer Father     meta to colon  . Cancer Father     Prostate   . Parkinsonism Mother   . Stroke Paternal Uncle     Surgical History Past Surgical History  Procedure Laterality Date  . Cataract extraction    . Tonsillectomy    . Ptca    . Appendectomy    . Hemorrhoid surgery    . Incisional hernia repair    . Basal cell cancer removal      forehead  . Dilation and curettage of uterus    . Endometrial biopsy    . Inguinal hernia repair    . Squamous cell carcinoma excision      left lower leg  . Coronary stent placement  2000  . Eye surgery      Allergies  Allergen Reactions  . Tranilast Hives    Investigational medication  . Iohexol      Desc: HIVES- 13 HR PRE-MEDS ARE REQUIRED-ASM- 03/21/05,SULFA,ADHESIVE TAPE   . Latex     Adhesive tape and EKG adhesive leads to rash  . Sulfonamide Derivatives     REACTION: hives    Current Outpatient Prescriptions  Medication Sig Dispense Refill  . aspirin 81 MG tablet Take 1 tablet (81 mg total) by mouth daily. 1 tablet   . cholecalciferol (VITAMIN D) 400 UNITS TABS Take 400 Units by mouth daily.     Marland Kitchen  clopidogrel (PLAVIX) 75 MG tablet TAKE 1 TABLET BY MOUTH DAILY. 30 tablet 11  . Coenzyme Q10 (COQ-10 PO) Take 100 mg by mouth daily. 1 tab po qd    . conjugated estrogens (PREMARIN) vaginal cream 0.5 g once a week. .625 mg once weekly    . dexlansoprazole (DEXILANT) 60 MG capsule Take 1 tablet ( Dexilant DR ) by mouth once daily    . diphenhydrAMINE (SOMINEX) 25 MG tablet Take 25 mg by mouth at bedtime as needed for sleep.    . hydrochlorothiazide (MICROZIDE) 12.5 MG capsule Take 1 capsule (12.5 mg total) by mouth daily. 30 capsule 11  . ibuprofen (ADVIL) 200 MG tablet Take 200 mg by mouth as needed.    . Melatonin 5 MG CAPS Take 1 capsule by mouth at bedtime.    . metoprolol succinate (TOPROL-XL) 25 MG 24 hr tablet TAKE 1 TABLET BY MOUTH EVERY DAY 30 tablet 3  . Multiple Vitamins-Minerals (CENTRUM SILVER PO) 1 tab po qd     . nitroGLYCERIN (NITROSTAT) 0.4 MG SL tablet Place 1 tablet (0.4 mg  total) under the tongue every 5 (five) minutes as needed. 25 tablet 3  . polyethylene glycol powder (GLYCOLAX/MIRALAX) powder Take 17 g by mouth daily.    . ramipril (ALTACE) 2.5 MG capsule Take 1 capsule (2.5 mg total) by mouth 2 (two) times daily. 60 capsule 11  . rosuvastatin (CRESTOR) 10 MG tablet TAKE 1 TABLET EVERY DAY 30 tablet 11  . sertraline (ZOLOFT) 25 MG tablet Take 25 mg by mouth daily.      No current facility-administered medications for this visit.    Review of Systems : See HPI for pertinent positives and negatives.  Physical Examination   General: WDWN in NAD Gait: Normal HENT: WNL Eyes: Pupils equal Pulmonary: normal non-labored breathing , without Rales, rhonchi,or  wheezing Cardiac: RRR, no murmurs detected  Abdomen: soft, NT, no palpable masses Skin: no rashes, no ulcers; no Gangrene , no cellulitis; no open wounds.   VASCULAR EXAM  Carotid Bruits Left Right   Negative Negative  Radial pulses: 2+ palpable and = Aorta is not palpable   VASCULAR EXAM: Extremities without ischemic changes  without Gangrene; without open wounds.     LE Pulses LEFT RIGHT   FEMORAL 2+ palpable 1+ palpable    POPLITEAL not palpable  not palpable   POSTERIOR TIBIAL not palpable  not palpable    DORSALIS PEDIS  ANTERIOR TIBIAL not palpable  not palpable     Musculoskeletal: no muscle wasting or atrophy; no peripheral edema Neurologic: A&O X 3; Appropriate Affect ;  SENSATION: normal; MOTOR FUNCTION: 5/5 Symmetric, CN 2-12 intact Speech is fluent/normal   Non-Invasive Vascular Imaging CAROTID DUPLEX 02/14/2014   Right ICA: 60 - 79 % stenosis. Left ICA: minimal stenosis.  Right ICA stenosis has increased compared to six months prior  Duplex.    Assessment: Stacy [name] is a 81 y.o. female who returns for followup of her carotid artery occlusive disease and history of small aortic aneurysm.  She presents with asymptomatic60 - 79 % right ICA stenosis and minimal left ICA stenosis. The  Right ICA stenosis has increased compared to previous exam.  June 2015 AAA Duplex showed no aneurysmal dilatation or hemodynamically significant stenosis of the abdominal aorta or proximal common iliac arteries. No significant change in the abdominal aorta diameters when compared to the previous exam.  Plan: Follow-up in 6 months with Carotid Duplex and AAA Duplex.   I  discussed in depth with the patient the nature of atherosclerosis, and emphasized the importance of maximal medical management including strict control of blood pressure, blood glucose, and lipid levels, obtaining regular exercise, and continued cessation of smoking.  The patient is aware that without maximal medical management the underlying atherosclerotic disease process will progress, limiting the benefit of any interventions. The patient was given information about stroke prevention and what symptoms should prompt the patient to seek immediate medical care. Thank you for allowing [place] to participate in this patient's care.  Clemon Chambers, RN, MSN, FNP-C [facility] Office: [facility] Physician: Trula Slade  02/14/2014 3:31 PM

## 2014-02-14 NOTE — Patient Instructions (Signed)
Stroke Prevention Some medical conditions and behaviors are associated with an increased chance of having a stroke. You may prevent a stroke by making healthy choices and managing medical conditions. HOW CAN I REDUCE MY RISK OF HAVING A STROKE?   Stay physically active. Get at least 30 minutes of activity on most or all days.  Do not smoke. It may also be helpful to avoid exposure to secondhand smoke.  Limit alcohol use. Moderate alcohol use is considered to be:  No more than 2 drinks per day for men.  No more than 1 drink per day for nonpregnant women.  Eat healthy foods. This involves:  Eating 5 or more servings of fruits and vegetables a day.  Making dietary changes that address high blood pressure (hypertension), high cholesterol, diabetes, or obesity.  Manage your cholesterol levels.  Making food choices that are high in fiber and low in saturated fat, trans fat, and cholesterol may control cholesterol levels.  Take any prescribed medicines to control cholesterol as directed by your health care provider.  Manage your diabetes.  Controlling your carbohydrate and sugar intake is recommended to manage diabetes.  Take any prescribed medicines to control diabetes as directed by your health care provider.  Control your hypertension.  Making food choices that are low in salt (sodium), saturated fat, trans fat, and cholesterol is recommended to manage hypertension.  Take any prescribed medicines to control hypertension as directed by your health care provider.  Maintain a healthy weight.  Reducing calorie intake and making food choices that are low in sodium, saturated fat, trans fat, and cholesterol are recommended to manage weight.  Stop drug abuse.  Avoid taking birth control pills.  Talk to your health care provider about the risks of taking birth control pills if you are over 35 years old, smoke, get migraines, or have ever had a blood clot.  Get evaluated for sleep  disorders (sleep apnea).  Talk to your health care provider about getting a sleep evaluation if you snore a lot or have excessive sleepiness.  Take medicines only as directed by your health care provider.  For some people, aspirin or blood thinners (anticoagulants) are helpful in reducing the risk of forming abnormal blood clots that can lead to stroke. If you have the irregular heart rhythm of atrial fibrillation, you should be on a blood thinner unless there is a good reason you cannot take them.  Understand all your medicine instructions.  Make sure that other conditions (such as anemia or atherosclerosis) are addressed. SEEK IMMEDIATE MEDICAL CARE IF:   You have sudden weakness or numbness of the face, arm, or leg, especially on one side of the body.  Your face or eyelid droops to one side.  You have sudden confusion.  You have trouble speaking (aphasia) or understanding.  You have sudden trouble seeing in one or both eyes.  You have sudden trouble walking.  You have dizziness.  You have a loss of balance or coordination.  You have a sudden, severe headache with no known cause.  You have new chest pain or an irregular heartbeat. Any of these symptoms may represent a serious problem that is an emergency. Do not wait to see if the symptoms will go away. Get medical help at once. Call your local emergency services (911 in [place] Do not drive yourself to the hospital. Document Released: 03/07/2004 Document Revised: 06/14/2013 Document Reviewed: 07/31/2012 [employer] Patient Information 2015 [employer]. This information is not intended to replace advice given   to you by your health care provider. Make sure you discuss any questions you have with your health care provider.  

## 2014-03-02 ENCOUNTER — Ambulatory Visit (HOSPITAL_COMMUNITY): Payer: Medicare Other

## 2014-03-08 ENCOUNTER — Ambulatory Visit (HOSPITAL_COMMUNITY)
Admission: RE | Admit: 2014-03-08 | Discharge: 2014-03-08 | Disposition: A | Discharge status: Home / Self Care | Payer: Medicare Other | Source: Ambulatory Visit | Attending: Legal Medicine | Admitting: Legal Medicine

## 2014-03-08 DIAGNOSIS — E042 Nontoxic multinodular goiter: Secondary | ICD-10-CM | POA: Insufficient documentation

## 2014-03-08 DIAGNOSIS — E049 Nontoxic goiter, unspecified: Secondary | ICD-10-CM | POA: Diagnosis not present

## 2014-03-08 DIAGNOSIS — E041 Nontoxic single thyroid nodule: Secondary | ICD-10-CM

## 2014-03-15 ENCOUNTER — Other Ambulatory Visit: Payer: Self-pay | Admitting: Dermatology

## 2014-03-15 DIAGNOSIS — L304 Erythema intertrigo: Secondary | ICD-10-CM | POA: Diagnosis not present

## 2014-03-15 DIAGNOSIS — L821 Other seborrheic keratosis: Secondary | ICD-10-CM | POA: Diagnosis not present

## 2014-03-15 DIAGNOSIS — Z85828 Personal history of other malignant neoplasm of skin: Secondary | ICD-10-CM | POA: Diagnosis not present

## 2014-03-15 DIAGNOSIS — L570 Actinic keratosis: Secondary | ICD-10-CM | POA: Diagnosis not present

## 2014-03-15 DIAGNOSIS — D485 Neoplasm of uncertain behavior of skin: Secondary | ICD-10-CM | POA: Diagnosis not present

## 2014-03-21 DIAGNOSIS — Z6826 Body mass index (BMI) 26.0-26.9, adult: Secondary | ICD-10-CM | POA: Diagnosis not present

## 2014-03-21 DIAGNOSIS — J019 Acute sinusitis, unspecified: Secondary | ICD-10-CM | POA: Diagnosis not present

## 2014-03-28 ENCOUNTER — Encounter (HOSPITAL_COMMUNITY): Payer: Self-pay | Admitting: Dentistry

## 2014-03-29 DIAGNOSIS — M8580 Other specified disorders of bone density and structure, unspecified site: Secondary | ICD-10-CM | POA: Diagnosis not present

## 2014-03-29 DIAGNOSIS — E042 Nontoxic multinodular goiter: Secondary | ICD-10-CM | POA: Diagnosis not present

## 2014-03-29 DIAGNOSIS — E119 Type 2 diabetes mellitus without complications: Secondary | ICD-10-CM | POA: Diagnosis not present

## 2014-04-12 ENCOUNTER — Other Ambulatory Visit: Payer: Self-pay | Admitting: Cardiovascular Disease

## 2014-04-14 ENCOUNTER — Other Ambulatory Visit: Payer: Self-pay

## 2014-04-14 DIAGNOSIS — I10 Essential (primary) hypertension: Secondary | ICD-10-CM

## 2014-04-14 MED ORDER — RAMIPRIL 2.5 MG PO CAPS: 2.5000 mg | ORAL_CAPSULE | Freq: Two times a day (BID) | ORAL | Status: DC

## 2014-04-15 ENCOUNTER — Ambulatory Visit (INDEPENDENT_AMBULATORY_CARE_PROVIDER_SITE_OTHER): Payer: Medicare Other | Admitting: Cardiovascular Disease

## 2014-04-15 ENCOUNTER — Encounter: Payer: Self-pay | Admitting: Cardiovascular Disease

## 2014-04-15 VITALS — BP 98/64 | HR 63 | Ht 63.0 in | Wt 151.0 lb

## 2014-04-15 DIAGNOSIS — I6523 Occlusion and stenosis of bilateral carotid arteries: Secondary | ICD-10-CM | POA: Diagnosis not present

## 2014-04-15 DIAGNOSIS — I2510 Atherosclerotic heart disease of native coronary artery without angina pectoris: Secondary | ICD-10-CM | POA: Diagnosis not present

## 2014-04-15 LAB — CK: CK TOTAL: 41 U/L (ref 7–177)

## 2014-04-15 MED ORDER — RAMIPRIL 2.5 MG PO CAPS
ORAL_CAPSULE | ORAL | Status: DC
Start: 2014-04-15 — End: 2014-06-10

## 2014-04-15 NOTE — Patient Instructions (Signed)
Your physician recommends that you return for lab work: TODAY (CK)  Your physician recommends that you continue on your current medications as directed. Please refer to the Current Medication list given to you today.  Your physician wants you to follow-up in: 6 month with Dr. Burt Knack. You will receive a reminder letter in the mail two months in advance. If you don't receive a letter, please call our office to schedule the follow-up appointment.

## 2014-04-15 NOTE — Progress Notes (Signed)
Cardiology Office Note   Date:  04/17/2014   ID:  Stacy, [name] 1926-02-22, MRN [id number]  PCP:  Lillard Anes, MD  Cardiologist:  Sherren Mocha, MD    Chief Complaint  Patient presents with  . Coronary Artery Disease     History of Present Illness: Stacy [name] is a 81 y.o. female who presents for follow-up of coronary artery disease. She initially presented in 1997 with an anterior MI treated with balloon angioplasty of the LAD. She subsequently underwent stenting of the right coronary artery in 2000. Because of recurrent symptoms, she underwent repeat heart catheterization in 2008 and this demonstrated patency of her previous angioplasty and stent sites.  The patient continues to do well from a cardiac perspective. She denies chest pain, shortness of breath, or leg swelling. She remains active. 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 She reports no medication changes and has no complaints today.  Past Medical History  Diagnosis Date  . Atheroembolism   . Peptic ulcer disease   . GERD (gastroesophageal reflux disease)   . Hyperlipidemia   . Renal artery stenosis   . Hemorrhoids   . Osteopenia   . DM (diabetes mellitus)   . CAD (coronary artery disease)   . Chronic kidney disease, stage III (moderate)   . Hypertension   . Myocardial infarction 03/15/95  . Heart murmur   . Leg pain   . Hiatal hernia   . Arthritis     Cervical DDD C5-6 and C6-7  . Depression   . History of shingles   . DDD (degenerative disc disease)   . Blue toe syndrome   . Hemorrhoid   . Thyroid nodule   . Carotid artery occlusion   . Cancer     skin BCC  Forehead and  SCC Left Lower Leg    Past Surgical History  Procedure Laterality Date  . Cataract extraction    . Tonsillectomy    . Ptca    . Appendectomy    . Hemorrhoid surgery    . Incisional hernia repair    . Basal cell cancer removal      forehead  . Dilation and curettage of uterus    .  Endometrial biopsy    . Inguinal hernia repair    . Squamous cell carcinoma excision      left lower leg  . Coronary stent placement  2000  . Eye surgery      Current Outpatient Prescriptions  Medication Sig Dispense Refill  . ACCU-CHEK SMARTVIEW test strip 1 each by Other route as needed for other (blood sugar test strips).   11  . aspirin 81 MG tablet Take 1 tablet (81 mg total) by mouth daily. 1 tablet   . cholecalciferol (VITAMIN D) 400 UNITS TABS Take 400 Units by mouth daily.     . clopidogrel (PLAVIX) 75 MG tablet TAKE 1 TABLET BY MOUTH DAILY. 30 tablet 11  . Coenzyme Q10 (COQ-10 PO) Take 100 mg by mouth daily. 1 tab po qd    . conjugated estrogens (PREMARIN) vaginal cream 0.5 g once a week. .625 mg once weekly    . dexlansoprazole (DEXILANT) 60 MG capsule Take 1 tablet ( Dexilant DR ) by mouth once daily    . diphenhydrAMINE (BENADRYL) 25 mg capsule Take 25 mg by mouth as needed.    . hydrochlorothiazide (MICROZIDE) 12.5 MG capsule Take 1 capsule (12.5 mg total) by mouth daily. 30 capsule 11  . hydrocortisone  2.5 % cream Apply 1 application topically 2 (two) times daily.   1  . ibuprofen (ADVIL) 200 MG tablet Take 200 mg by mouth as needed.    Marland Kitchen ketoconazole (NIZORAL) 2 % cream Apply topically 2 (two) times daily.  1  . Melatonin 5 MG CAPS Take 1 capsule by mouth at bedtime.    . metoprolol succinate (TOPROL-XL) 25 MG 24 hr tablet TAKE 1 TABLET BY MOUTH EVERY DAY 30 tablet 3  . Multiple Vitamins-Minerals (CENTRUM SILVER PO) 1 tab po qd     . NEOMYCIN-POLYMYXIN-HYDROCORTISONE (CORTISPORIN) 1 % SOLN otic solution Place 3 drops into both ears as needed (place two drops into each ear as needed for itching).   3  . nitroGLYCERIN (NITROSTAT) 0.4 MG SL tablet Place 1 tablet (0.4 mg total) under the tongue every 5 (five) minutes as needed. 25 tablet 3  . polyethylene glycol powder (GLYCOLAX/MIRALAX) powder Take 17 g by mouth daily.    . rosuvastatin (CRESTOR) 10 MG tablet TAKE 1 TABLET  EVERY DAY 30 tablet 11  . sertraline (ZOLOFT) 25 MG tablet Take 25 mg by mouth daily.     . ramipril (ALTACE) 2.5 MG capsule TAKE 1 CAPSULE (2.5 MG TOTAL) BY MOUTH 2 (TWO) TIMES DAILY. 60 capsule 11   No current facility-administered medications for this visit.    Allergies:   Iohexol; Sulfonamide derivatives; Latex; and Tranilast   Social History:  The patient  reports that she quit smoking about 15 years ago. Her smoking use included Cigarettes. She has a 54 pack-year smoking history. She has never used smokeless tobacco. She reports that she does not drink alcohol or use illicit drugs.   Family History:  The patient's  family history includes Cancer in her father; Diabetes in her father; Parkinsonism in her mother; Prostate cancer in her father; Stroke in her paternal uncle.    ROS:  Please see the history of present illness.  Otherwise, review of systems is positive for leg pain.  All other systems are reviewed and negative.    PHYSICAL EXAM: VS:  BP 98/64 mmHg  Pulse 63  Ht 5\' 3"  (1.6 m)  Wt 151 lb (68.493 kg)  BMI 26.76 kg/m2  SpO2 97% , BMI Body mass index is 26.76 kg/(m^2). GEN: Well nourished, well developed, in no acute distress HEENT: normal Neck: no JVD, no masses, no carotid bruits Cardiac: RRR without murmur or gallop                Respiratory:  clear to auscultation bilaterally, normal work of breathing GI: soft, nontender, nondistended, + BS MS: no deformity or atrophy Ext: no pretibial edema Skin: warm and dry, no rash Neuro:  Strength and sensation are intact Psych: euthymic mood, full affect  EKG:  EKG is not ordered today.  Recent Labs: No results found for requested labs within last 365 days.  Outside labs reviewed: Creatinine 0.95, AST 27, ALT 28, cholesterol 148, HDL 47, LDL 62, hemoglobin A1c 6.8, TSH 1.94  Lipid Panel     Component Value Date/Time   CHOL 136 01/16/2012 0958   TRIG 154.0* 01/16/2012 0958   HDL 40.60 01/16/2012 0958   CHOLHDL 3  01/16/2012 0958   VLDL 30.8 01/16/2012 0958   LDLCALC 65 01/16/2012 0958      Wt Readings from Last 3 Encounters:  04/15/14 151 lb (68.493 kg)  02/14/14 152 lb (68.947 kg)  10/26/13 147 lb (66.679 kg)    ASSESSMENT AND PLAN: 1.  CAD,  native vessel: Stable with no symptoms of angina. Tolerating aspirin, Plavix, and a statin drug. We'll follow-up in 6 months.  2. Hyperlipidemia: The patient does have some leg pain, but this does not appear to be significantly changed. She continues on coenzyme Q10 and Crestor 10 mg daily. Will check a CK level.  3. Hypertension: Tolerating ramipril and metoprolol succinate. Blood pressure is under ideal control.   Current medicines are reviewed with the patient today.  The patient does not have concerns regarding medicines.  The following changes have been made:  no change  Labs/ tests ordered today include:   Orders Placed This Encounter  Procedures  . CK (Creatine Kinase)    Disposition:   FU 6 months  Signed, Sherren Mocha, MD  04/17/2014 10:13 PM    [facility] [address], [place], [place]  [address] Phone: [phone]; Fax: [phone]

## 2014-04-17 ENCOUNTER — Encounter: Payer: Self-pay | Admitting: Cardiovascular Disease

## 2014-04-28 ENCOUNTER — Ambulatory Visit: Payer: Self-pay | Admitting: Cardiovascular Disease

## 2014-05-10 DIAGNOSIS — E785 Hyperlipidemia, unspecified: Secondary | ICD-10-CM | POA: Diagnosis not present

## 2014-05-10 DIAGNOSIS — N183 Chronic kidney disease, stage 3 (moderate): Secondary | ICD-10-CM | POA: Diagnosis not present

## 2014-05-10 DIAGNOSIS — I2510 Atherosclerotic heart disease of native coronary artery without angina pectoris: Secondary | ICD-10-CM | POA: Diagnosis not present

## 2014-05-10 DIAGNOSIS — E1129 Type 2 diabetes mellitus with other diabetic kidney complication: Secondary | ICD-10-CM | POA: Diagnosis not present

## 2014-05-10 DIAGNOSIS — I10 Essential (primary) hypertension: Secondary | ICD-10-CM | POA: Diagnosis not present

## 2014-05-12 DIAGNOSIS — E1129 Type 2 diabetes mellitus with other diabetic kidney complication: Secondary | ICD-10-CM | POA: Diagnosis not present

## 2014-05-12 DIAGNOSIS — I701 Atherosclerosis of renal artery: Secondary | ICD-10-CM | POA: Diagnosis not present

## 2014-05-12 DIAGNOSIS — I2510 Atherosclerotic heart disease of native coronary artery without angina pectoris: Secondary | ICD-10-CM | POA: Diagnosis not present

## 2014-05-12 DIAGNOSIS — I10 Essential (primary) hypertension: Secondary | ICD-10-CM | POA: Diagnosis not present

## 2014-05-12 DIAGNOSIS — E785 Hyperlipidemia, unspecified: Secondary | ICD-10-CM | POA: Diagnosis not present

## 2014-05-12 DIAGNOSIS — N183 Chronic kidney disease, stage 3 (moderate): Secondary | ICD-10-CM | POA: Diagnosis not present

## 2014-05-18 ENCOUNTER — Other Ambulatory Visit: Payer: Self-pay | Admitting: Legal Medicine

## 2014-05-18 DIAGNOSIS — I701 Atherosclerosis of renal artery: Secondary | ICD-10-CM

## 2014-05-26 ENCOUNTER — Other Ambulatory Visit: Payer: Self-pay

## 2014-05-27 ENCOUNTER — Other Ambulatory Visit: Payer: Self-pay

## 2014-05-31 ENCOUNTER — Ambulatory Visit
Admission: RE | Admit: 2014-05-31 | Discharge: 2014-05-31 | Disposition: A | Discharge status: Home / Self Care | Payer: Medicare Other | Source: Ambulatory Visit | Attending: Legal Medicine | Admitting: Legal Medicine

## 2014-05-31 DIAGNOSIS — E119 Type 2 diabetes mellitus without complications: Secondary | ICD-10-CM | POA: Diagnosis not present

## 2014-05-31 DIAGNOSIS — I701 Atherosclerosis of renal artery: Secondary | ICD-10-CM

## 2014-05-31 DIAGNOSIS — N289 Disorder of kidney and ureter, unspecified: Secondary | ICD-10-CM | POA: Diagnosis not present

## 2014-05-31 DIAGNOSIS — I10 Essential (primary) hypertension: Secondary | ICD-10-CM | POA: Diagnosis not present

## 2014-06-07 DIAGNOSIS — Z85828 Personal history of other malignant neoplasm of skin: Secondary | ICD-10-CM | POA: Diagnosis not present

## 2014-06-07 DIAGNOSIS — L438 Other lichen planus: Secondary | ICD-10-CM | POA: Diagnosis not present

## 2014-06-07 DIAGNOSIS — L821 Other seborrheic keratosis: Secondary | ICD-10-CM | POA: Diagnosis not present

## 2014-06-07 DIAGNOSIS — D1721 Benign lipomatous neoplasm of skin and subcutaneous tissue of right arm: Secondary | ICD-10-CM | POA: Diagnosis not present

## 2014-06-10 ENCOUNTER — Other Ambulatory Visit: Payer: Self-pay

## 2014-06-10 MED ORDER — RAMIPRIL 2.5 MG PO CAPS: ORAL_CAPSULE | ORAL | Status: DC

## 2014-06-10 MED ORDER — CLOPIDOGREL BISULFATE 75 MG PO TABS: 75.0000 mg | ORAL_TABLET | Freq: Every day | ORAL | Status: DC

## 2014-06-10 MED ORDER — HYDROCHLOROTHIAZIDE 12.5 MG PO CAPS: 12.5000 mg | ORAL_CAPSULE | Freq: Every day | ORAL | Status: DC

## 2014-06-10 MED ORDER — METOPROLOL SUCCINATE ER 25 MG PO TB24
25.0000 mg | ORAL_TABLET | Freq: Every day | ORAL | Status: DC
Start: 2014-06-10 — End: 2014-11-24

## 2014-06-23 DIAGNOSIS — M9905 Segmental and somatic dysfunction of pelvic region: Secondary | ICD-10-CM | POA: Diagnosis not present

## 2014-06-23 DIAGNOSIS — M9904 Segmental and somatic dysfunction of sacral region: Secondary | ICD-10-CM | POA: Diagnosis not present

## 2014-06-23 DIAGNOSIS — M5431 Sciatica, right side: Secondary | ICD-10-CM | POA: Diagnosis not present

## 2014-06-23 DIAGNOSIS — M5416 Radiculopathy, lumbar region: Secondary | ICD-10-CM | POA: Diagnosis not present

## 2014-06-23 DIAGNOSIS — M25551 Pain in right hip: Secondary | ICD-10-CM | POA: Diagnosis not present

## 2014-06-23 DIAGNOSIS — M545 Low back pain: Secondary | ICD-10-CM | POA: Diagnosis not present

## 2014-06-23 DIAGNOSIS — M9903 Segmental and somatic dysfunction of lumbar region: Secondary | ICD-10-CM | POA: Diagnosis not present

## 2014-06-28 DIAGNOSIS — M9904 Segmental and somatic dysfunction of sacral region: Secondary | ICD-10-CM | POA: Diagnosis not present

## 2014-06-28 DIAGNOSIS — M5431 Sciatica, right side: Secondary | ICD-10-CM | POA: Diagnosis not present

## 2014-06-28 DIAGNOSIS — M9903 Segmental and somatic dysfunction of lumbar region: Secondary | ICD-10-CM | POA: Diagnosis not present

## 2014-06-28 DIAGNOSIS — M5416 Radiculopathy, lumbar region: Secondary | ICD-10-CM | POA: Diagnosis not present

## 2014-06-28 DIAGNOSIS — M545 Low back pain: Secondary | ICD-10-CM | POA: Diagnosis not present

## 2014-06-28 DIAGNOSIS — M25551 Pain in right hip: Secondary | ICD-10-CM | POA: Diagnosis not present

## 2014-06-28 DIAGNOSIS — M9905 Segmental and somatic dysfunction of pelvic region: Secondary | ICD-10-CM | POA: Diagnosis not present

## 2014-06-30 DIAGNOSIS — M9904 Segmental and somatic dysfunction of sacral region: Secondary | ICD-10-CM | POA: Diagnosis not present

## 2014-06-30 DIAGNOSIS — M5431 Sciatica, right side: Secondary | ICD-10-CM | POA: Diagnosis not present

## 2014-06-30 DIAGNOSIS — M9903 Segmental and somatic dysfunction of lumbar region: Secondary | ICD-10-CM | POA: Diagnosis not present

## 2014-06-30 DIAGNOSIS — M545 Low back pain: Secondary | ICD-10-CM | POA: Diagnosis not present

## 2014-06-30 DIAGNOSIS — M25551 Pain in right hip: Secondary | ICD-10-CM | POA: Diagnosis not present

## 2014-06-30 DIAGNOSIS — M5416 Radiculopathy, lumbar region: Secondary | ICD-10-CM | POA: Diagnosis not present

## 2014-06-30 DIAGNOSIS — M9905 Segmental and somatic dysfunction of pelvic region: Secondary | ICD-10-CM | POA: Diagnosis not present

## 2014-07-04 DIAGNOSIS — M5431 Sciatica, right side: Secondary | ICD-10-CM | POA: Diagnosis not present

## 2014-07-04 DIAGNOSIS — M545 Low back pain: Secondary | ICD-10-CM | POA: Diagnosis not present

## 2014-07-04 DIAGNOSIS — M25551 Pain in right hip: Secondary | ICD-10-CM | POA: Diagnosis not present

## 2014-07-04 DIAGNOSIS — Q762 Congenital spondylolisthesis: Secondary | ICD-10-CM | POA: Diagnosis not present

## 2014-07-04 DIAGNOSIS — M5416 Radiculopathy, lumbar region: Secondary | ICD-10-CM | POA: Diagnosis not present

## 2014-07-04 DIAGNOSIS — M9904 Segmental and somatic dysfunction of sacral region: Secondary | ICD-10-CM | POA: Diagnosis not present

## 2014-07-04 DIAGNOSIS — M9905 Segmental and somatic dysfunction of pelvic region: Secondary | ICD-10-CM | POA: Diagnosis not present

## 2014-07-04 DIAGNOSIS — M9903 Segmental and somatic dysfunction of lumbar region: Secondary | ICD-10-CM | POA: Diagnosis not present

## 2014-07-05 DIAGNOSIS — M545 Low back pain: Secondary | ICD-10-CM | POA: Diagnosis not present

## 2014-07-05 DIAGNOSIS — M25551 Pain in right hip: Secondary | ICD-10-CM | POA: Diagnosis not present

## 2014-07-05 DIAGNOSIS — M5416 Radiculopathy, lumbar region: Secondary | ICD-10-CM | POA: Diagnosis not present

## 2014-07-05 DIAGNOSIS — Q762 Congenital spondylolisthesis: Secondary | ICD-10-CM | POA: Diagnosis not present

## 2014-07-05 DIAGNOSIS — M9905 Segmental and somatic dysfunction of pelvic region: Secondary | ICD-10-CM | POA: Diagnosis not present

## 2014-07-05 DIAGNOSIS — M9903 Segmental and somatic dysfunction of lumbar region: Secondary | ICD-10-CM | POA: Diagnosis not present

## 2014-07-05 DIAGNOSIS — M5431 Sciatica, right side: Secondary | ICD-10-CM | POA: Diagnosis not present

## 2014-07-05 DIAGNOSIS — M9904 Segmental and somatic dysfunction of sacral region: Secondary | ICD-10-CM | POA: Diagnosis not present

## 2014-07-06 DIAGNOSIS — M5431 Sciatica, right side: Secondary | ICD-10-CM | POA: Diagnosis not present

## 2014-07-06 DIAGNOSIS — Q762 Congenital spondylolisthesis: Secondary | ICD-10-CM | POA: Diagnosis not present

## 2014-07-06 DIAGNOSIS — M545 Low back pain: Secondary | ICD-10-CM | POA: Diagnosis not present

## 2014-07-06 DIAGNOSIS — M9905 Segmental and somatic dysfunction of pelvic region: Secondary | ICD-10-CM | POA: Diagnosis not present

## 2014-07-06 DIAGNOSIS — M9903 Segmental and somatic dysfunction of lumbar region: Secondary | ICD-10-CM | POA: Diagnosis not present

## 2014-07-06 DIAGNOSIS — M5416 Radiculopathy, lumbar region: Secondary | ICD-10-CM | POA: Diagnosis not present

## 2014-07-06 DIAGNOSIS — M9904 Segmental and somatic dysfunction of sacral region: Secondary | ICD-10-CM | POA: Diagnosis not present

## 2014-07-06 DIAGNOSIS — M25551 Pain in right hip: Secondary | ICD-10-CM | POA: Diagnosis not present

## 2014-07-12 DIAGNOSIS — M5431 Sciatica, right side: Secondary | ICD-10-CM | POA: Diagnosis not present

## 2014-07-12 DIAGNOSIS — M5416 Radiculopathy, lumbar region: Secondary | ICD-10-CM | POA: Diagnosis not present

## 2014-07-12 DIAGNOSIS — M9903 Segmental and somatic dysfunction of lumbar region: Secondary | ICD-10-CM | POA: Diagnosis not present

## 2014-07-12 DIAGNOSIS — M9904 Segmental and somatic dysfunction of sacral region: Secondary | ICD-10-CM | POA: Diagnosis not present

## 2014-07-12 DIAGNOSIS — M545 Low back pain: Secondary | ICD-10-CM | POA: Diagnosis not present

## 2014-07-12 DIAGNOSIS — M25551 Pain in right hip: Secondary | ICD-10-CM | POA: Diagnosis not present

## 2014-07-12 DIAGNOSIS — M9905 Segmental and somatic dysfunction of pelvic region: Secondary | ICD-10-CM | POA: Diagnosis not present

## 2014-07-12 DIAGNOSIS — Q762 Congenital spondylolisthesis: Secondary | ICD-10-CM | POA: Diagnosis not present

## 2014-07-13 DIAGNOSIS — M545 Low back pain: Secondary | ICD-10-CM | POA: Diagnosis not present

## 2014-07-13 DIAGNOSIS — M9905 Segmental and somatic dysfunction of pelvic region: Secondary | ICD-10-CM | POA: Diagnosis not present

## 2014-07-13 DIAGNOSIS — M5431 Sciatica, right side: Secondary | ICD-10-CM | POA: Diagnosis not present

## 2014-07-13 DIAGNOSIS — M9904 Segmental and somatic dysfunction of sacral region: Secondary | ICD-10-CM | POA: Diagnosis not present

## 2014-07-13 DIAGNOSIS — M25551 Pain in right hip: Secondary | ICD-10-CM | POA: Diagnosis not present

## 2014-07-13 DIAGNOSIS — Q762 Congenital spondylolisthesis: Secondary | ICD-10-CM | POA: Diagnosis not present

## 2014-07-13 DIAGNOSIS — M5416 Radiculopathy, lumbar region: Secondary | ICD-10-CM | POA: Diagnosis not present

## 2014-07-13 DIAGNOSIS — M9903 Segmental and somatic dysfunction of lumbar region: Secondary | ICD-10-CM | POA: Diagnosis not present

## 2014-07-14 DIAGNOSIS — Q762 Congenital spondylolisthesis: Secondary | ICD-10-CM | POA: Diagnosis not present

## 2014-07-14 DIAGNOSIS — M9904 Segmental and somatic dysfunction of sacral region: Secondary | ICD-10-CM | POA: Diagnosis not present

## 2014-07-14 DIAGNOSIS — M5416 Radiculopathy, lumbar region: Secondary | ICD-10-CM | POA: Diagnosis not present

## 2014-07-14 DIAGNOSIS — M9903 Segmental and somatic dysfunction of lumbar region: Secondary | ICD-10-CM | POA: Diagnosis not present

## 2014-07-14 DIAGNOSIS — M5431 Sciatica, right side: Secondary | ICD-10-CM | POA: Diagnosis not present

## 2014-07-14 DIAGNOSIS — M545 Low back pain: Secondary | ICD-10-CM | POA: Diagnosis not present

## 2014-07-14 DIAGNOSIS — M9905 Segmental and somatic dysfunction of pelvic region: Secondary | ICD-10-CM | POA: Diagnosis not present

## 2014-07-14 DIAGNOSIS — M25551 Pain in right hip: Secondary | ICD-10-CM | POA: Diagnosis not present

## 2014-07-15 DIAGNOSIS — Z6826 Body mass index (BMI) 26.0-26.9, adult: Secondary | ICD-10-CM | POA: Diagnosis not present

## 2014-07-15 DIAGNOSIS — S80811A Abrasion, right lower leg, initial encounter: Secondary | ICD-10-CM | POA: Diagnosis not present

## 2014-07-18 DIAGNOSIS — M25551 Pain in right hip: Secondary | ICD-10-CM | POA: Diagnosis not present

## 2014-07-18 DIAGNOSIS — M5431 Sciatica, right side: Secondary | ICD-10-CM | POA: Diagnosis not present

## 2014-07-18 DIAGNOSIS — M545 Low back pain: Secondary | ICD-10-CM | POA: Diagnosis not present

## 2014-07-18 DIAGNOSIS — M5416 Radiculopathy, lumbar region: Secondary | ICD-10-CM | POA: Diagnosis not present

## 2014-07-18 DIAGNOSIS — M9905 Segmental and somatic dysfunction of pelvic region: Secondary | ICD-10-CM | POA: Diagnosis not present

## 2014-07-18 DIAGNOSIS — Q762 Congenital spondylolisthesis: Secondary | ICD-10-CM | POA: Diagnosis not present

## 2014-07-18 DIAGNOSIS — M9904 Segmental and somatic dysfunction of sacral region: Secondary | ICD-10-CM | POA: Diagnosis not present

## 2014-07-18 DIAGNOSIS — M9903 Segmental and somatic dysfunction of lumbar region: Secondary | ICD-10-CM | POA: Diagnosis not present

## 2014-07-19 DIAGNOSIS — M545 Low back pain: Secondary | ICD-10-CM | POA: Diagnosis not present

## 2014-07-19 DIAGNOSIS — Q762 Congenital spondylolisthesis: Secondary | ICD-10-CM | POA: Diagnosis not present

## 2014-07-19 DIAGNOSIS — M5431 Sciatica, right side: Secondary | ICD-10-CM | POA: Diagnosis not present

## 2014-07-19 DIAGNOSIS — M9905 Segmental and somatic dysfunction of pelvic region: Secondary | ICD-10-CM | POA: Diagnosis not present

## 2014-07-19 DIAGNOSIS — M25551 Pain in right hip: Secondary | ICD-10-CM | POA: Diagnosis not present

## 2014-07-19 DIAGNOSIS — M9904 Segmental and somatic dysfunction of sacral region: Secondary | ICD-10-CM | POA: Diagnosis not present

## 2014-07-19 DIAGNOSIS — M9903 Segmental and somatic dysfunction of lumbar region: Secondary | ICD-10-CM | POA: Diagnosis not present

## 2014-07-19 DIAGNOSIS — M5416 Radiculopathy, lumbar region: Secondary | ICD-10-CM | POA: Diagnosis not present

## 2014-07-21 DIAGNOSIS — Q762 Congenital spondylolisthesis: Secondary | ICD-10-CM | POA: Diagnosis not present

## 2014-07-21 DIAGNOSIS — M9904 Segmental and somatic dysfunction of sacral region: Secondary | ICD-10-CM | POA: Diagnosis not present

## 2014-07-21 DIAGNOSIS — M5416 Radiculopathy, lumbar region: Secondary | ICD-10-CM | POA: Diagnosis not present

## 2014-07-21 DIAGNOSIS — M5431 Sciatica, right side: Secondary | ICD-10-CM | POA: Diagnosis not present

## 2014-07-21 DIAGNOSIS — M9903 Segmental and somatic dysfunction of lumbar region: Secondary | ICD-10-CM | POA: Diagnosis not present

## 2014-07-21 DIAGNOSIS — M9905 Segmental and somatic dysfunction of pelvic region: Secondary | ICD-10-CM | POA: Diagnosis not present

## 2014-07-21 DIAGNOSIS — M25551 Pain in right hip: Secondary | ICD-10-CM | POA: Diagnosis not present

## 2014-07-21 DIAGNOSIS — M545 Low back pain: Secondary | ICD-10-CM | POA: Diagnosis not present

## 2014-07-25 DIAGNOSIS — M9903 Segmental and somatic dysfunction of lumbar region: Secondary | ICD-10-CM | POA: Diagnosis not present

## 2014-07-25 DIAGNOSIS — M9905 Segmental and somatic dysfunction of pelvic region: Secondary | ICD-10-CM | POA: Diagnosis not present

## 2014-07-25 DIAGNOSIS — M545 Low back pain: Secondary | ICD-10-CM | POA: Diagnosis not present

## 2014-07-25 DIAGNOSIS — M5431 Sciatica, right side: Secondary | ICD-10-CM | POA: Diagnosis not present

## 2014-07-25 DIAGNOSIS — Q762 Congenital spondylolisthesis: Secondary | ICD-10-CM | POA: Diagnosis not present

## 2014-07-25 DIAGNOSIS — M25551 Pain in right hip: Secondary | ICD-10-CM | POA: Diagnosis not present

## 2014-07-25 DIAGNOSIS — M9904 Segmental and somatic dysfunction of sacral region: Secondary | ICD-10-CM | POA: Diagnosis not present

## 2014-07-25 DIAGNOSIS — M5416 Radiculopathy, lumbar region: Secondary | ICD-10-CM | POA: Diagnosis not present

## 2014-07-27 DIAGNOSIS — M5416 Radiculopathy, lumbar region: Secondary | ICD-10-CM | POA: Diagnosis not present

## 2014-07-27 DIAGNOSIS — M25551 Pain in right hip: Secondary | ICD-10-CM | POA: Diagnosis not present

## 2014-07-27 DIAGNOSIS — M9905 Segmental and somatic dysfunction of pelvic region: Secondary | ICD-10-CM | POA: Diagnosis not present

## 2014-07-27 DIAGNOSIS — M9903 Segmental and somatic dysfunction of lumbar region: Secondary | ICD-10-CM | POA: Diagnosis not present

## 2014-07-27 DIAGNOSIS — Q762 Congenital spondylolisthesis: Secondary | ICD-10-CM | POA: Diagnosis not present

## 2014-07-27 DIAGNOSIS — M5431 Sciatica, right side: Secondary | ICD-10-CM | POA: Diagnosis not present

## 2014-07-27 DIAGNOSIS — M545 Low back pain: Secondary | ICD-10-CM | POA: Diagnosis not present

## 2014-07-27 DIAGNOSIS — M9904 Segmental and somatic dysfunction of sacral region: Secondary | ICD-10-CM | POA: Diagnosis not present

## 2014-07-28 DIAGNOSIS — S80811D Abrasion, right lower leg, subsequent encounter: Secondary | ICD-10-CM | POA: Diagnosis not present

## 2014-07-28 DIAGNOSIS — Z6826 Body mass index (BMI) 26.0-26.9, adult: Secondary | ICD-10-CM | POA: Diagnosis not present

## 2014-07-28 DIAGNOSIS — H905 Unspecified sensorineural hearing loss: Secondary | ICD-10-CM | POA: Diagnosis not present

## 2014-08-01 DIAGNOSIS — M9905 Segmental and somatic dysfunction of pelvic region: Secondary | ICD-10-CM | POA: Diagnosis not present

## 2014-08-01 DIAGNOSIS — M9904 Segmental and somatic dysfunction of sacral region: Secondary | ICD-10-CM | POA: Diagnosis not present

## 2014-08-01 DIAGNOSIS — Q762 Congenital spondylolisthesis: Secondary | ICD-10-CM | POA: Diagnosis not present

## 2014-08-01 DIAGNOSIS — M25551 Pain in right hip: Secondary | ICD-10-CM | POA: Diagnosis not present

## 2014-08-01 DIAGNOSIS — M5431 Sciatica, right side: Secondary | ICD-10-CM | POA: Diagnosis not present

## 2014-08-01 DIAGNOSIS — M5416 Radiculopathy, lumbar region: Secondary | ICD-10-CM | POA: Diagnosis not present

## 2014-08-01 DIAGNOSIS — M9903 Segmental and somatic dysfunction of lumbar region: Secondary | ICD-10-CM | POA: Diagnosis not present

## 2014-08-01 DIAGNOSIS — M545 Low back pain: Secondary | ICD-10-CM | POA: Diagnosis not present

## 2014-08-03 DIAGNOSIS — M9905 Segmental and somatic dysfunction of pelvic region: Secondary | ICD-10-CM | POA: Diagnosis not present

## 2014-08-03 DIAGNOSIS — M545 Low back pain: Secondary | ICD-10-CM | POA: Diagnosis not present

## 2014-08-03 DIAGNOSIS — M9903 Segmental and somatic dysfunction of lumbar region: Secondary | ICD-10-CM | POA: Diagnosis not present

## 2014-08-03 DIAGNOSIS — M5416 Radiculopathy, lumbar region: Secondary | ICD-10-CM | POA: Diagnosis not present

## 2014-08-03 DIAGNOSIS — M9904 Segmental and somatic dysfunction of sacral region: Secondary | ICD-10-CM | POA: Diagnosis not present

## 2014-08-03 DIAGNOSIS — Q762 Congenital spondylolisthesis: Secondary | ICD-10-CM | POA: Diagnosis not present

## 2014-08-03 DIAGNOSIS — M5431 Sciatica, right side: Secondary | ICD-10-CM | POA: Diagnosis not present

## 2014-08-03 DIAGNOSIS — M25551 Pain in right hip: Secondary | ICD-10-CM | POA: Diagnosis not present

## 2014-08-05 DIAGNOSIS — Z6826 Body mass index (BMI) 26.0-26.9, adult: Secondary | ICD-10-CM | POA: Diagnosis not present

## 2014-08-05 DIAGNOSIS — S8011XD Contusion of right lower leg, subsequent encounter: Secondary | ICD-10-CM | POA: Diagnosis not present

## 2014-08-08 ENCOUNTER — Ambulatory Visit: Payer: Self-pay | Admitting: Family

## 2014-08-08 ENCOUNTER — Other Ambulatory Visit (HOSPITAL_COMMUNITY): Payer: Self-pay

## 2014-08-08 DIAGNOSIS — M9903 Segmental and somatic dysfunction of lumbar region: Secondary | ICD-10-CM | POA: Diagnosis not present

## 2014-08-08 DIAGNOSIS — M545 Low back pain: Secondary | ICD-10-CM | POA: Diagnosis not present

## 2014-08-08 DIAGNOSIS — M9904 Segmental and somatic dysfunction of sacral region: Secondary | ICD-10-CM | POA: Diagnosis not present

## 2014-08-08 DIAGNOSIS — M5416 Radiculopathy, lumbar region: Secondary | ICD-10-CM | POA: Diagnosis not present

## 2014-08-08 DIAGNOSIS — M25551 Pain in right hip: Secondary | ICD-10-CM | POA: Diagnosis not present

## 2014-08-08 DIAGNOSIS — M5431 Sciatica, right side: Secondary | ICD-10-CM | POA: Diagnosis not present

## 2014-08-08 DIAGNOSIS — M9905 Segmental and somatic dysfunction of pelvic region: Secondary | ICD-10-CM | POA: Diagnosis not present

## 2014-08-08 DIAGNOSIS — Q762 Congenital spondylolisthesis: Secondary | ICD-10-CM | POA: Diagnosis not present

## 2014-08-10 DIAGNOSIS — M545 Low back pain: Secondary | ICD-10-CM | POA: Diagnosis not present

## 2014-08-10 DIAGNOSIS — Q762 Congenital spondylolisthesis: Secondary | ICD-10-CM | POA: Diagnosis not present

## 2014-08-10 DIAGNOSIS — M9904 Segmental and somatic dysfunction of sacral region: Secondary | ICD-10-CM | POA: Diagnosis not present

## 2014-08-10 DIAGNOSIS — M25551 Pain in right hip: Secondary | ICD-10-CM | POA: Diagnosis not present

## 2014-08-10 DIAGNOSIS — M5416 Radiculopathy, lumbar region: Secondary | ICD-10-CM | POA: Diagnosis not present

## 2014-08-10 DIAGNOSIS — M9905 Segmental and somatic dysfunction of pelvic region: Secondary | ICD-10-CM | POA: Diagnosis not present

## 2014-08-10 DIAGNOSIS — M5431 Sciatica, right side: Secondary | ICD-10-CM | POA: Diagnosis not present

## 2014-08-10 DIAGNOSIS — M9903 Segmental and somatic dysfunction of lumbar region: Secondary | ICD-10-CM | POA: Diagnosis not present

## 2014-08-16 DIAGNOSIS — M5416 Radiculopathy, lumbar region: Secondary | ICD-10-CM | POA: Diagnosis not present

## 2014-08-16 DIAGNOSIS — M545 Low back pain: Secondary | ICD-10-CM | POA: Diagnosis not present

## 2014-08-16 DIAGNOSIS — M5431 Sciatica, right side: Secondary | ICD-10-CM | POA: Diagnosis not present

## 2014-08-16 DIAGNOSIS — M25551 Pain in right hip: Secondary | ICD-10-CM | POA: Diagnosis not present

## 2014-08-16 DIAGNOSIS — M9905 Segmental and somatic dysfunction of pelvic region: Secondary | ICD-10-CM | POA: Diagnosis not present

## 2014-08-16 DIAGNOSIS — Q762 Congenital spondylolisthesis: Secondary | ICD-10-CM | POA: Diagnosis not present

## 2014-08-16 DIAGNOSIS — M9903 Segmental and somatic dysfunction of lumbar region: Secondary | ICD-10-CM | POA: Diagnosis not present

## 2014-08-16 DIAGNOSIS — M9904 Segmental and somatic dysfunction of sacral region: Secondary | ICD-10-CM | POA: Diagnosis not present

## 2014-08-19 DIAGNOSIS — M25551 Pain in right hip: Secondary | ICD-10-CM | POA: Diagnosis not present

## 2014-08-19 DIAGNOSIS — M9903 Segmental and somatic dysfunction of lumbar region: Secondary | ICD-10-CM | POA: Diagnosis not present

## 2014-08-19 DIAGNOSIS — Q762 Congenital spondylolisthesis: Secondary | ICD-10-CM | POA: Diagnosis not present

## 2014-08-19 DIAGNOSIS — M9905 Segmental and somatic dysfunction of pelvic region: Secondary | ICD-10-CM | POA: Diagnosis not present

## 2014-08-19 DIAGNOSIS — M5416 Radiculopathy, lumbar region: Secondary | ICD-10-CM | POA: Diagnosis not present

## 2014-08-19 DIAGNOSIS — M5431 Sciatica, right side: Secondary | ICD-10-CM | POA: Diagnosis not present

## 2014-08-19 DIAGNOSIS — M545 Low back pain: Secondary | ICD-10-CM | POA: Diagnosis not present

## 2014-08-19 DIAGNOSIS — M9904 Segmental and somatic dysfunction of sacral region: Secondary | ICD-10-CM | POA: Diagnosis not present

## 2014-08-22 DIAGNOSIS — M9905 Segmental and somatic dysfunction of pelvic region: Secondary | ICD-10-CM | POA: Diagnosis not present

## 2014-08-22 DIAGNOSIS — M5416 Radiculopathy, lumbar region: Secondary | ICD-10-CM | POA: Diagnosis not present

## 2014-08-22 DIAGNOSIS — Q762 Congenital spondylolisthesis: Secondary | ICD-10-CM | POA: Diagnosis not present

## 2014-08-22 DIAGNOSIS — M545 Low back pain: Secondary | ICD-10-CM | POA: Diagnosis not present

## 2014-08-22 DIAGNOSIS — M9903 Segmental and somatic dysfunction of lumbar region: Secondary | ICD-10-CM | POA: Diagnosis not present

## 2014-08-22 DIAGNOSIS — M5431 Sciatica, right side: Secondary | ICD-10-CM | POA: Diagnosis not present

## 2014-08-22 DIAGNOSIS — M9904 Segmental and somatic dysfunction of sacral region: Secondary | ICD-10-CM | POA: Diagnosis not present

## 2014-08-22 DIAGNOSIS — M25551 Pain in right hip: Secondary | ICD-10-CM | POA: Diagnosis not present

## 2014-08-25 ENCOUNTER — Encounter: Payer: Self-pay | Admitting: Family

## 2014-08-25 DIAGNOSIS — E119 Type 2 diabetes mellitus without complications: Secondary | ICD-10-CM | POA: Diagnosis not present

## 2014-08-25 DIAGNOSIS — H524 Presbyopia: Secondary | ICD-10-CM | POA: Diagnosis not present

## 2014-08-29 ENCOUNTER — Ambulatory Visit (HOSPITAL_COMMUNITY)
Admission: RE | Admit: 2014-08-29 | Discharge: 2014-08-29 | Disposition: A | Discharge status: Home / Self Care | Payer: Medicare Other | Source: Ambulatory Visit | Attending: Family | Admitting: Family

## 2014-08-29 ENCOUNTER — Ambulatory Visit (INDEPENDENT_AMBULATORY_CARE_PROVIDER_SITE_OTHER): Payer: Medicare Other | Admitting: Family

## 2014-08-29 ENCOUNTER — Other Ambulatory Visit: Payer: Self-pay | Admitting: Family

## 2014-08-29 ENCOUNTER — Ambulatory Visit (INDEPENDENT_AMBULATORY_CARE_PROVIDER_SITE_OTHER)
Admission: RE | Admit: 2014-08-29 | Discharge: 2014-08-29 | Disposition: A | Discharge status: Home / Self Care | Payer: Medicare Other | Source: Ambulatory Visit | Attending: Family | Admitting: Family

## 2014-08-29 ENCOUNTER — Encounter: Payer: Self-pay | Admitting: Family

## 2014-08-29 VITALS — BP 115/69 | HR 54 | Temp 97.0°F | Resp 14 | Ht 63.5 in | Wt 147.0 lb

## 2014-08-29 DIAGNOSIS — I6523 Occlusion and stenosis of bilateral carotid arteries: Secondary | ICD-10-CM

## 2014-08-29 DIAGNOSIS — I714 Abdominal aortic aneurysm, without rupture, unspecified: Secondary | ICD-10-CM

## 2014-08-29 DIAGNOSIS — R0989 Other specified symptoms and signs involving the circulatory and respiratory systems: Secondary | ICD-10-CM | POA: Diagnosis not present

## 2014-08-29 DIAGNOSIS — M9903 Segmental and somatic dysfunction of lumbar region: Secondary | ICD-10-CM | POA: Diagnosis not present

## 2014-08-29 DIAGNOSIS — M545 Low back pain: Secondary | ICD-10-CM | POA: Diagnosis not present

## 2014-08-29 DIAGNOSIS — M9904 Segmental and somatic dysfunction of sacral region: Secondary | ICD-10-CM | POA: Diagnosis not present

## 2014-08-29 DIAGNOSIS — M9905 Segmental and somatic dysfunction of pelvic region: Secondary | ICD-10-CM | POA: Diagnosis not present

## 2014-08-29 DIAGNOSIS — M5431 Sciatica, right side: Secondary | ICD-10-CM | POA: Diagnosis not present

## 2014-08-29 DIAGNOSIS — M5416 Radiculopathy, lumbar region: Secondary | ICD-10-CM | POA: Diagnosis not present

## 2014-08-29 DIAGNOSIS — Q762 Congenital spondylolisthesis: Secondary | ICD-10-CM | POA: Diagnosis not present

## 2014-08-29 DIAGNOSIS — I701 Atherosclerosis of renal artery: Secondary | ICD-10-CM | POA: Diagnosis not present

## 2014-08-29 DIAGNOSIS — M25551 Pain in right hip: Secondary | ICD-10-CM | POA: Diagnosis not present

## 2014-08-29 NOTE — Addendum Note (Signed)
Addended by: Dorthula Rue L on: 08/29/2014 12:06 PM   Modules accepted: Orders

## 2014-08-29 NOTE — Progress Notes (Signed)
[facility] [place] HISTORY AND PHYSICAL   MRN : [id number]  History of Present Illness:   Stacy [name] is a 81 y.o. female patient of Dr. Trula Slade. The patient comes back today for followup of her carotid artery occlusive disease and history of small aortic aneurysm. Her pedal pulses are not palpable, she has no claudication symptoms, no non healing wounds.  Her ABI's in January 2016 were normal with all triphasic waveforms, TBI's were below normal.  She has right sciatic pain, taking acupuncture for this, states this has not helped.  She denies any history of stroke or TIA. She denies any new back or abdominal pain.  She had an MI in 1997, angioplasty, had a cardiac stent placed in 2000. Dr. Sherren Mocha is her cardiologist.  Her walking is limited by her right sciatic pain.   Pt Diabetic: Yes, diet controlled Pt smoker: former smoker, quit in 2000  Pt meds include: Statin :Yes Betablocker: Yes ASA: Yes Other anticoagulants/antiplatelets: Plavix    Current Outpatient Prescriptions  Medication Sig Dispense Refill  . ACCU-CHEK SMARTVIEW test strip 1 each by Other route as needed for other (blood sugar test strips).   11  . aspirin 81 MG tablet Take 1 tablet (81 mg total) by mouth daily. 1 tablet   . cholecalciferol (VITAMIN D) 400 UNITS TABS Take 400 Units by mouth daily.     . clopidogrel (PLAVIX) 75 MG tablet Take 1 tablet (75 mg total) by mouth daily. 90 tablet 1  . Coenzyme Q10 (COQ-10 PO) Take 100 mg by mouth daily. 1 tab po qd    . conjugated estrogens (PREMARIN) vaginal cream 0.5 g once a week. .625 mg once weekly    . dexlansoprazole (DEXILANT) 60 MG capsule Take 1 tablet ( Dexilant DR ) by mouth once daily    . diphenhydrAMINE (BENADRYL) 25 mg capsule Take 25 mg by mouth as needed.    . hydrochlorothiazide (MICROZIDE) 12.5 MG capsule Take 1 capsule (12.5 mg total) by mouth daily. 90 capsule 3  . hydrocortisone 2.5 % cream Apply 1 application  topically 2 (two) times daily.   1  . ibuprofen (ADVIL) 200 MG tablet Take 200 mg by mouth as needed.    Marland Kitchen ketoconazole (NIZORAL) 2 % cream Apply topically 2 (two) times daily.  1  . Melatonin 5 MG CAPS Take 1 capsule by mouth at bedtime.    . metoprolol succinate (TOPROL-XL) 25 MG 24 hr tablet Take 1 tablet (25 mg total) by mouth daily. 90 tablet 6  . Multiple Vitamins-Minerals (CENTRUM SILVER PO) 1 tab po qd     . NEOMYCIN-POLYMYXIN-HYDROCORTISONE (CORTISPORIN) 1 % SOLN otic solution Place 3 drops into both ears as needed (place two drops into each ear as needed for itching).   3  . nitroGLYCERIN (NITROSTAT) 0.4 MG SL tablet Place 1 tablet (0.4 mg total) under the tongue every 5 (five) minutes as needed. 25 tablet 3  . polyethylene glycol powder (GLYCOLAX/MIRALAX) powder Take 17 g by mouth daily.    . ramipril (ALTACE) 2.5 MG capsule TAKE 1 CAPSULE (2.5 MG TOTAL) BY MOUTH 2 (TWO) TIMES DAILY. 180 capsule 6  . rosuvastatin (CRESTOR) 10 MG tablet TAKE 1 TABLET EVERY DAY 30 tablet 11  . sertraline (ZOLOFT) 25 MG tablet Take 25 mg by mouth daily.      No current facility-administered medications for this visit.    Past Medical History  Diagnosis Date  . Atheroembolism   . Peptic  ulcer disease   . GERD (gastroesophageal reflux disease)   . Hyperlipidemia   . Renal artery stenosis   . Hemorrhoids   . Osteopenia   . DM (diabetes mellitus)   . CAD (coronary artery disease)   . Chronic kidney disease, stage III (moderate)   . Hypertension   . Myocardial infarction 03/15/95  . Heart murmur   . Leg pain   . Hiatal hernia   . Arthritis     Cervical DDD C5-6 and C6-7  . Depression   . History of shingles   . DDD (degenerative disc disease)   . Blue toe syndrome   . Hemorrhoid   . Thyroid nodule   . Carotid artery occlusion   . Cancer     skin BCC  Forehead and  SCC Left Lower Leg    Social History History  Substance Use Topics  . Smoking status: Former Smoker -- 1.00 packs/day for  54 years    Types: Cigarettes    Quit date: 10/05/1998  . Smokeless tobacco: Never Used  . Alcohol Use: No    Family History Family History  Problem Relation Age of Onset  . Diabetes Father   . Prostate cancer Father     meta to colon  . Cancer Father     Prostate  . Parkinsonism Mother   . Stroke Paternal Uncle     Surgical History Past Surgical History  Procedure Laterality Date  . Cataract extraction    . Tonsillectomy    . Ptca    . Appendectomy    . Hemorrhoid surgery    . Incisional hernia repair    . Basal cell cancer removal      forehead  . Dilation and curettage of uterus    . Endometrial biopsy    . Inguinal hernia repair    . Squamous cell carcinoma excision      left lower leg  . Coronary stent placement  2000  . Eye surgery      Allergies  Allergen Reactions  . Iohexol Other (See Comments)     Desc: HIVES- 13 HR PRE-MEDS ARE REQUIRED-ASM- 03/21/05,SULFA,ADHESIVE TAPE   . Sulfonamide Derivatives Hives    REACTION: hives  . Latex Other (See Comments)    Adhesive tape and EKG adhesive leads to rash  . Tranilast Hives    Investigational medication    Current Outpatient Prescriptions  Medication Sig Dispense Refill  . ACCU-CHEK SMARTVIEW test strip 1 each by Other route as needed for other (blood sugar test strips).   11  . aspirin 81 MG tablet Take 1 tablet (81 mg total) by mouth daily. 1 tablet   . cholecalciferol (VITAMIN D) 400 UNITS TABS Take 400 Units by mouth daily.     . clopidogrel (PLAVIX) 75 MG tablet Take 1 tablet (75 mg total) by mouth daily. 90 tablet 1  . Coenzyme Q10 (COQ-10 PO) Take 100 mg by mouth daily. 1 tab po qd    . conjugated estrogens (PREMARIN) vaginal cream 0.5 g once a week. .625 mg once weekly    . dexlansoprazole (DEXILANT) 60 MG capsule Take 1 tablet ( Dexilant DR ) by mouth once daily    . diphenhydrAMINE (BENADRYL) 25 mg capsule Take 25 mg by mouth as needed.    . hydrochlorothiazide (MICROZIDE) 12.5 MG capsule  Take 1 capsule (12.5 mg total) by mouth daily. 90 capsule 3  . hydrocortisone 2.5 % cream Apply 1 application topically 2 (two) times daily.  1  . ibuprofen (ADVIL) 200 MG tablet Take 200 mg by mouth as needed.    Marland Kitchen ketoconazole (NIZORAL) 2 % cream Apply topically 2 (two) times daily.  1  . Melatonin 5 MG CAPS Take 1 capsule by mouth at bedtime.    . metoprolol succinate (TOPROL-XL) 25 MG 24 hr tablet Take 1 tablet (25 mg total) by mouth daily. 90 tablet 6  . Multiple Vitamins-Minerals (CENTRUM SILVER PO) 1 tab po qd     . NEOMYCIN-POLYMYXIN-HYDROCORTISONE (CORTISPORIN) 1 % SOLN otic solution Place 3 drops into both ears as needed (place two drops into each ear as needed for itching).   3  . nitroGLYCERIN (NITROSTAT) 0.4 MG SL tablet Place 1 tablet (0.4 mg total) under the tongue every 5 (five) minutes as needed. 25 tablet 3  . polyethylene glycol powder (GLYCOLAX/MIRALAX) powder Take 17 g by mouth daily.    . ramipril (ALTACE) 2.5 MG capsule TAKE 1 CAPSULE (2.5 MG TOTAL) BY MOUTH 2 (TWO) TIMES DAILY. 180 capsule 6  . rosuvastatin (CRESTOR) 10 MG tablet TAKE 1 TABLET EVERY DAY 30 tablet 11  . sertraline (ZOLOFT) 25 MG tablet Take 25 mg by mouth daily.      No current facility-administered medications for this visit.     REVIEW OF SYSTEMS: See HPI for pertinent positives and negatives.  Physical Examination Filed Vitals:   08/29/14 1033  BP: 118/73  Pulse: 60   There is no weight on file to calculate BMI.  General: WDWN in NAD Gait: Normal HENT: WNL Eyes: Pupils equal Pulmonary: normal non-labored breathing , without Rales, rhonchi,or wheezing Cardiac: regular rate and rhythm with occasional to frequent premature contractions, no murmurs detected  Abdomen: soft, NT, no palpable masses Skin: no rashes, no ulcers; no Gangrene , no cellulitis; no open wounds.   VASCULAR EXAM  Carotid Bruits Left Right   Negative Negative  Radial pulses: 2+ palpable and  = Aorta is not palpable   VASCULAR EXAM: Extremities without ischemic changes  without Gangrene; without open wounds.     LE Pulses LEFT RIGHT   FEMORAL not palpable not palpable    POPLITEAL not palpable  not palpable   POSTERIOR TIBIAL not palpable  not palpable    DORSALIS PEDIS  ANTERIOR TIBIAL not palpable  not palpable     Musculoskeletal: no muscle wasting or atrophy; no peripheral edema Neurologic: A&O X 3; Appropriate Affect ;  SENSATION: normal; MOTOR FUNCTION: 5/5 Symmetric, CN 2-12 intact Speech is fluent/normal         Non-Invasive Vascular Imaging (08/29/2014):  CEREBROVASCULAR DUPLEX EVALUATION    INDICATION: Carotid artery disease    PREVIOUS INTERVENTION(S): None    DUPLEX EXAM: Carotid duplex    RIGHT  LEFT  Peak Systolic Velocities (cm/s) End Diastolic Velocities (cm/s) Plaque LOCATION Peak Systolic Velocities (cm/s) End Diastolic Velocities (cm/s) Plaque  71 13 - CCA PROXIMAL 123 24 -  99 19 - CCA MID 101 19 -  73 16 HT CCA DISTAL 108 25 -  194 30 - ECA 93 6 -  380 103 HT ICA PROXIMAL 91 24 -  126 33 - ICA MID 77 25 -  77 20 - ICA DISTAL 125 41 -    3.8 ICA / CCA Ratio (PSV) 1.2  Antegrade Vertebral Flow Antegrade  938 Brachial Systolic Pressure (mmHg) 101  Triphasic Brachial Artery Waveforms Triphasic    Plaque Morphology:  HM = Homogeneous, HT = Heterogeneous, CP = Calcific Plaque, SP = Smooth  Plaque, IP = Irregular Plaque  ADDITIONAL FINDINGS:     IMPRESSION: 1. 60 - 79% right internal carotid artery stenosis 2. Less than 40% left internal carotid artery stenosis.    Compared to the previous exam:  Increased velocity on the right since prior exam      ABDOMINAL AORTA DUPLEX EVALUATION    INDICATION: Abdominal  aortic aneurysm    PREVIOUS INTERVENTION(S): None    DUPLEX EXAM:     LOCATION DIAMETER AP (cm) DIAMETER TRANSVERSE (cm) VELOCITIES (cm/sec)  Aorta Proximal 2.3 2.3 61  Aorta Mid 1.4 1.5 58  Aorta Distal 1.1 1.0 86  Right Common Iliac Artery 1.0 1.0 123  Left Common Iliac Artery .98 .98 149    Previous max aortic diameter:  2.3 x 2.4 Date: 08/09/2013  ADDITIONAL FINDINGS:     IMPRESSION: No aneurysm or dilatation noted in the abdominal aorta or proximal common iliac arteries.    Compared to the previous exam:  No change from prior exams.     ASSESSMENT:  Stacy [name] is a 81 y.o. female who has not history of stroke or TIA. Today's carotid Duplex suggests 60 - 79% right internal carotid artery stenosis and less than 40% left internal carotid artery stenosis. Increased stenosis in the right ICA since prior exam.  Today's AAA Duplex suggests no aneurysm or dilatation noted in the abdominal aorta or proximal common iliac arteries which is no change from prior Duplex.  Her femoral pulses were palpable six months ago, are not palpable today; popliteal and pedal pulses are also not palpable.  ABI's in January 2016 were normal, will recheck in 6 months.  Cardiac ascultation: SR with occasional to frequent premature contractions not noted at her visit six months ago. Pt advised to notify Dr. Antionette Char office of this. I will fax my progress notes from today to his office. She denies dizziness, denies chest pain, denies dyspnea.   Face to face time with patient was 25 minutes. Over 50% of this time was spent on counseling and coordination of care.   PLAN:   Based on today's exam and non-invasive vascular lab results, the patient will follow up in 6 months with the following tests: carotid Duplex and ABI's. Will reassess for AAA in 1 year. I discussed in depth with the patient the nature of atherosclerosis, and emphasized the importance of maximal medical management including strict  control of blood pressure, blood glucose, and lipid levels, obtaining regular exercise, and cessation of smoking.  The patient is aware that without maximal medical management the underlying atherosclerotic disease process will progress, limiting the benefit of any interventions. Consideration for repair of AAA would be made when the size is 5.5 cm, growth > 1 cm/yr, and symptomatic status. The patient was given information about stroke prevention and what symptoms should prompt the patient to seek immediate medical care. The patient was given information about AAA including signs, symptoms, treatment,  what symptoms should prompt the patient to seek immediate medical care, and how to minimize the risk of enlargement and rupture of aneurysms. . Thank you for allowing [place] to participate in this patient's care.  Clemon Chambers, RN, MSN, FNP-C [facility] Office: [phone]  Clinic MD: Trula Slade  08/29/2014 10:37 AM

## 2014-08-29 NOTE — Patient Instructions (Addendum)
Stroke Prevention Some medical conditions and behaviors are associated with an increased chance of having a stroke. You may prevent a stroke by making healthy choices and managing medical conditions. HOW CAN I REDUCE MY RISK OF HAVING A STROKE?   Stay physically active. Get at least 30 minutes of activity on most or all days.  Do not smoke. It may also be helpful to avoid exposure to secondhand smoke.  Limit alcohol use. Moderate alcohol use is considered to be:  No more than 2 drinks per day for men.  No more than 1 drink per day for nonpregnant women.  Eat healthy foods. This involves:  Eating 5 or more servings of fruits and vegetables a day.  Making dietary changes that address high blood pressure (hypertension), high cholesterol, diabetes, or obesity.  Manage your cholesterol levels.  Making food choices that are high in fiber and low in saturated fat, trans fat, and cholesterol may control cholesterol levels.  Take any prescribed medicines to control cholesterol as directed by your health care provider.  Manage your diabetes.  Controlling your carbohydrate and sugar intake is recommended to manage diabetes.  Take any prescribed medicines to control diabetes as directed by your health care provider.  Control your hypertension.  Making food choices that are low in salt (sodium), saturated fat, trans fat, and cholesterol is recommended to manage hypertension.  Take any prescribed medicines to control hypertension as directed by your health care provider.  Maintain a healthy weight.  Reducing calorie intake and making food choices that are low in sodium, saturated fat, trans fat, and cholesterol are recommended to manage weight.  Stop drug abuse.  Avoid taking birth control pills.  Talk to your health care provider about the risks of taking birth control pills if you are over 35 years old, smoke, get migraines, or have ever had a blood clot.  Get evaluated for sleep  disorders (sleep apnea).  Talk to your health care provider about getting a sleep evaluation if you snore a lot or have excessive sleepiness.  Take medicines only as directed by your health care provider.  For some people, aspirin or blood thinners (anticoagulants) are helpful in reducing the risk of forming abnormal blood clots that can lead to stroke. If you have the irregular heart rhythm of atrial fibrillation, you should be on a blood thinner unless there is a good reason you cannot take them.  Understand all your medicine instructions.  Make sure that other conditions (such as anemia or atherosclerosis) are addressed. SEEK IMMEDIATE MEDICAL CARE IF:   You have sudden weakness or numbness of the face, arm, or leg, especially on one side of the body.  Your face or eyelid droops to one side.  You have sudden confusion.  You have trouble speaking (aphasia) or understanding.  You have sudden trouble seeing in one or both eyes.  You have sudden trouble walking.  You have dizziness.  You have a loss of balance or coordination.  You have a sudden, severe headache with no known cause.  You have new chest pain or an irregular heartbeat. Any of these symptoms may represent a serious problem that is an emergency. Do not wait to see if the symptoms will go away. Get medical help at once. Call your local emergency services (911 in [place] Do not drive yourself to the hospital. Document Released: 03/07/2004 Document Revised: 06/14/2013 Document Reviewed: 07/31/2012 [employer] Patient Information 2015 [employer]. This information is not intended to replace advice given   to you by your health care provider. Make sure you discuss any questions you have with your health care provider.  

## 2014-08-31 DIAGNOSIS — L570 Actinic keratosis: Secondary | ICD-10-CM | POA: Diagnosis not present

## 2014-08-31 DIAGNOSIS — L821 Other seborrheic keratosis: Secondary | ICD-10-CM | POA: Diagnosis not present

## 2014-08-31 DIAGNOSIS — Z85828 Personal history of other malignant neoplasm of skin: Secondary | ICD-10-CM | POA: Diagnosis not present

## 2014-09-01 ENCOUNTER — Telehealth: Payer: Self-pay | Admitting: Cardiovascular Disease

## 2014-09-01 NOTE — Telephone Encounter (Signed)
I spoke with Stacy [name] and made her aware that the pt does have a history of PACs per EKG review.  The pt is doing well and I made Stacy [name] aware that an appointment is not needed at this time.

## 2014-09-01 NOTE — Telephone Encounter (Signed)
New Message  Pt daughter called states the pt had a 6 month follow up with PCP for 6 month exam. They found early heart beats. Pt's daughter request a call back to determine if an earlier appt is needed. Please call

## 2014-09-12 DIAGNOSIS — E1129 Type 2 diabetes mellitus with other diabetic kidney complication: Secondary | ICD-10-CM | POA: Diagnosis not present

## 2014-09-12 DIAGNOSIS — N183 Chronic kidney disease, stage 3 (moderate): Secondary | ICD-10-CM | POA: Diagnosis not present

## 2014-09-12 DIAGNOSIS — I10 Essential (primary) hypertension: Secondary | ICD-10-CM | POA: Diagnosis not present

## 2014-09-12 DIAGNOSIS — E785 Hyperlipidemia, unspecified: Secondary | ICD-10-CM | POA: Diagnosis not present

## 2014-09-12 DIAGNOSIS — I2510 Atherosclerotic heart disease of native coronary artery without angina pectoris: Secondary | ICD-10-CM | POA: Diagnosis not present

## 2014-09-14 DIAGNOSIS — E1129 Type 2 diabetes mellitus with other diabetic kidney complication: Secondary | ICD-10-CM | POA: Diagnosis not present

## 2014-09-14 DIAGNOSIS — E785 Hyperlipidemia, unspecified: Secondary | ICD-10-CM | POA: Diagnosis not present

## 2014-09-14 DIAGNOSIS — M9904 Segmental and somatic dysfunction of sacral region: Secondary | ICD-10-CM | POA: Diagnosis not present

## 2014-09-14 DIAGNOSIS — Z6826 Body mass index (BMI) 26.0-26.9, adult: Secondary | ICD-10-CM | POA: Diagnosis not present

## 2014-09-14 DIAGNOSIS — M5431 Sciatica, right side: Secondary | ICD-10-CM | POA: Diagnosis not present

## 2014-09-14 DIAGNOSIS — M5416 Radiculopathy, lumbar region: Secondary | ICD-10-CM | POA: Diagnosis not present

## 2014-09-14 DIAGNOSIS — M9903 Segmental and somatic dysfunction of lumbar region: Secondary | ICD-10-CM | POA: Diagnosis not present

## 2014-09-14 DIAGNOSIS — N183 Chronic kidney disease, stage 3 (moderate): Secondary | ICD-10-CM | POA: Diagnosis not present

## 2014-09-14 DIAGNOSIS — M9905 Segmental and somatic dysfunction of pelvic region: Secondary | ICD-10-CM | POA: Diagnosis not present

## 2014-09-14 DIAGNOSIS — M25551 Pain in right hip: Secondary | ICD-10-CM | POA: Diagnosis not present

## 2014-09-14 DIAGNOSIS — I10 Essential (primary) hypertension: Secondary | ICD-10-CM | POA: Diagnosis not present

## 2014-09-14 DIAGNOSIS — Q762 Congenital spondylolisthesis: Secondary | ICD-10-CM | POA: Diagnosis not present

## 2014-09-14 DIAGNOSIS — M545 Low back pain: Secondary | ICD-10-CM | POA: Diagnosis not present

## 2014-09-28 DIAGNOSIS — M9905 Segmental and somatic dysfunction of pelvic region: Secondary | ICD-10-CM | POA: Diagnosis not present

## 2014-09-28 DIAGNOSIS — M9904 Segmental and somatic dysfunction of sacral region: Secondary | ICD-10-CM | POA: Diagnosis not present

## 2014-09-28 DIAGNOSIS — M25551 Pain in right hip: Secondary | ICD-10-CM | POA: Diagnosis not present

## 2014-09-28 DIAGNOSIS — Q762 Congenital spondylolisthesis: Secondary | ICD-10-CM | POA: Diagnosis not present

## 2014-09-28 DIAGNOSIS — M545 Low back pain: Secondary | ICD-10-CM | POA: Diagnosis not present

## 2014-09-28 DIAGNOSIS — M5416 Radiculopathy, lumbar region: Secondary | ICD-10-CM | POA: Diagnosis not present

## 2014-09-28 DIAGNOSIS — M5431 Sciatica, right side: Secondary | ICD-10-CM | POA: Diagnosis not present

## 2014-09-28 DIAGNOSIS — M9903 Segmental and somatic dysfunction of lumbar region: Secondary | ICD-10-CM | POA: Diagnosis not present

## 2014-10-04 DIAGNOSIS — E119 Type 2 diabetes mellitus without complications: Secondary | ICD-10-CM | POA: Diagnosis not present

## 2014-10-04 DIAGNOSIS — M8580 Other specified disorders of bone density and structure, unspecified site: Secondary | ICD-10-CM | POA: Diagnosis not present

## 2014-10-04 DIAGNOSIS — E1165 Type 2 diabetes mellitus with hyperglycemia: Secondary | ICD-10-CM | POA: Diagnosis not present

## 2014-10-04 DIAGNOSIS — E042 Nontoxic multinodular goiter: Secondary | ICD-10-CM | POA: Diagnosis not present

## 2014-10-07 DIAGNOSIS — N362 Urethral caruncle: Secondary | ICD-10-CM | POA: Diagnosis not present

## 2014-10-10 DIAGNOSIS — Q762 Congenital spondylolisthesis: Secondary | ICD-10-CM | POA: Diagnosis not present

## 2014-10-10 DIAGNOSIS — M5431 Sciatica, right side: Secondary | ICD-10-CM | POA: Diagnosis not present

## 2014-10-10 DIAGNOSIS — M9904 Segmental and somatic dysfunction of sacral region: Secondary | ICD-10-CM | POA: Diagnosis not present

## 2014-10-10 DIAGNOSIS — M9903 Segmental and somatic dysfunction of lumbar region: Secondary | ICD-10-CM | POA: Diagnosis not present

## 2014-10-10 DIAGNOSIS — M25551 Pain in right hip: Secondary | ICD-10-CM | POA: Diagnosis not present

## 2014-10-10 DIAGNOSIS — M545 Low back pain: Secondary | ICD-10-CM | POA: Diagnosis not present

## 2014-10-10 DIAGNOSIS — M9905 Segmental and somatic dysfunction of pelvic region: Secondary | ICD-10-CM | POA: Diagnosis not present

## 2014-10-10 DIAGNOSIS — M5416 Radiculopathy, lumbar region: Secondary | ICD-10-CM | POA: Diagnosis not present

## 2014-11-02 DIAGNOSIS — Z6826 Body mass index (BMI) 26.0-26.9, adult: Secondary | ICD-10-CM | POA: Diagnosis not present

## 2014-11-02 DIAGNOSIS — J0100 Acute maxillary sinusitis, unspecified: Secondary | ICD-10-CM | POA: Diagnosis not present

## 2014-11-14 ENCOUNTER — Other Ambulatory Visit: Payer: Self-pay | Admitting: Cardiovascular Disease

## 2014-11-17 DIAGNOSIS — Z23 Encounter for immunization: Secondary | ICD-10-CM | POA: Diagnosis not present

## 2014-11-17 DIAGNOSIS — Z6826 Body mass index (BMI) 26.0-26.9, adult: Secondary | ICD-10-CM | POA: Diagnosis not present

## 2014-11-17 DIAGNOSIS — Z0000 Encounter for general adult medical examination without abnormal findings: Secondary | ICD-10-CM | POA: Diagnosis not present

## 2014-11-21 DIAGNOSIS — Z1211 Encounter for screening for malignant neoplasm of colon: Secondary | ICD-10-CM | POA: Diagnosis not present

## 2014-11-24 ENCOUNTER — Ambulatory Visit (INDEPENDENT_AMBULATORY_CARE_PROVIDER_SITE_OTHER): Payer: Medicare Other | Admitting: Cardiovascular Disease

## 2014-11-24 ENCOUNTER — Encounter: Payer: Self-pay | Admitting: Cardiovascular Disease

## 2014-11-24 VITALS — BP 100/62 | HR 72 | Ht 63.5 in | Wt 148.0 lb

## 2014-11-24 DIAGNOSIS — E785 Hyperlipidemia, unspecified: Secondary | ICD-10-CM

## 2014-11-24 DIAGNOSIS — I701 Atherosclerosis of renal artery: Secondary | ICD-10-CM | POA: Diagnosis not present

## 2014-11-24 DIAGNOSIS — I2510 Atherosclerotic heart disease of native coronary artery without angina pectoris: Secondary | ICD-10-CM

## 2014-11-24 MED ORDER — CLOPIDOGREL BISULFATE 75 MG PO TABS: 75.0000 mg | ORAL_TABLET | Freq: Every day | ORAL | Status: DC

## 2014-11-24 MED ORDER — ROSUVASTATIN CALCIUM 10 MG PO TABS: 10.0000 mg | ORAL_TABLET | Freq: Every day | ORAL | Status: DC

## 2014-11-24 MED ORDER — METOPROLOL SUCCINATE ER 25 MG PO TB24: 25.0000 mg | ORAL_TABLET | Freq: Every day | ORAL | Status: DC

## 2014-11-24 MED ORDER — RAMIPRIL 2.5 MG PO CAPS: ORAL_CAPSULE | ORAL | Status: DC

## 2014-11-24 MED ORDER — HYDROCHLOROTHIAZIDE 12.5 MG PO CAPS: 12.5000 mg | ORAL_CAPSULE | Freq: Every day | ORAL | Status: DC

## 2014-11-24 NOTE — Patient Instructions (Signed)
Medication Instructions:  Your physician recommends that you continue on your current medications as directed. Please refer to the Current Medication list given to you today.  Labwork: No new orders.   Testing/Procedures: No new orders.   Follow-Up: Your physician wants you to follow-up in: 6 MONTHS with Dr Burt Knack.  You will receive a reminder letter in the mail two months in advance. If you don't receive a letter, please call our office to schedule the follow-up appointment.   Any Other Special Instructions Will Be Listed Below (If Applicable).

## 2014-11-24 NOTE — Progress Notes (Signed)
Cardiology Office Note Date:  11/24/2014   ID:  TONNIE WALTON, DOB 1926-07-24, MRN [id number]  PCP:  Lillard Anes, MD  Cardiologist:  Sherren Mocha, MD    No chief complaint on file.  History of Present Illness: Stacy [name] is a 81 y.o. female who presents for follow-up evaluation. She initially presented in 1997 with an anterior MI treated with balloon angioplasty of the LAD. She subsequently underwent stenting of the right coronary artery in 2000. Because of recurrent symptoms, she underwent repeat heart catheterization in 2008 and this demonstrated patency of her previous angioplasty and stent sites.  The patient is doing well. 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 No chest pain or pressure, dyspnea, edema, orthopnea, PND, or edema. She is here with her daughter today. Brings in labs for review. Chol 124, LDL 46, HDL 51, and triglycerides 134. LFT's normal.   Past Medical History  Diagnosis Date  . Atheroembolism   . Peptic ulcer disease   . GERD (gastroesophageal reflux disease)   . Hyperlipidemia   . Renal artery stenosis ([place])   . Hemorrhoids   . Osteopenia   . DM (diabetes mellitus) ([place])   . CAD (coronary artery disease)   . Chronic kidney disease, stage III (moderate)   . Hypertension   . Myocardial infarction ([place]) 03/15/95  . Heart murmur   . Leg pain   . Hiatal hernia   . Arthritis     Cervical DDD C5-6 and C6-7  . Depression   . History of shingles   . DDD (degenerative disc disease)   . Blue toe syndrome ([place])   . Hemorrhoid   . Thyroid nodule   . Carotid artery occlusion   . Cancer ([place])     skin BCC  Forehead and  SCC Left Lower Leg    Past Surgical History  Procedure Laterality Date  . Cataract extraction    . Tonsillectomy    . Ptca    . Appendectomy    . Hemorrhoid surgery    . Incisional hernia repair    . Basal cell cancer removal      forehead  . Dilation and curettage of uterus    . Endometrial biopsy      . Inguinal hernia repair    . Squamous cell carcinoma excision      left lower leg  . Coronary stent placement  2000  . Eye surgery      Current Outpatient Prescriptions  Medication Sig Dispense Refill  . ACCU-CHEK SMARTVIEW test strip 1 each by Other route as needed for other (blood sugar test strips).   11  . aspirin 81 MG tablet Take 1 tablet (81 mg total) by mouth daily. 1 tablet   . cholecalciferol (VITAMIN D) 400 UNITS TABS Take 400 Units by mouth daily.     . clopidogrel (PLAVIX) 75 MG tablet Take 1 tablet (75 mg total) by mouth daily. 90 tablet 1  . Coenzyme Q10 (COQ-10 PO) Take 100 mg by mouth daily. 1 tab po qd    . conjugated estrogens (PREMARIN) vaginal cream 0.5 g once a week. .625 mg once weekly    . CRESTOR 10 MG tablet TAKE 1 TABLET EVERY DAY 30 tablet 3  . dexlansoprazole (DEXILANT) 60 MG capsule Take 1 tablet ( Dexilant DR ) by mouth once daily    . diphenhydrAMINE (BENADRYL) 25 mg capsule Take 25 mg by mouth as needed for allergies.     Marland Kitchen  hydrochlorothiazide (MICROZIDE) 12.5 MG capsule Take 1 capsule (12.5 mg total) by mouth daily. 90 capsule 3  . hydrocortisone 2.5 % cream Apply 1 application topically 2 (two) times daily.   1  . ibuprofen (ADVIL) 200 MG tablet Take 200 mg by mouth as needed for mild pain.     Marland Kitchen ketoconazole (NIZORAL) 2 % cream Apply topically 2 (two) times daily.  1  . Melatonin 5 MG CAPS Take 1 capsule by mouth at bedtime.    . metoprolol succinate (TOPROL-XL) 25 MG 24 hr tablet Take 1 tablet (25 mg total) by mouth daily. 90 tablet 6  . Multiple Vitamins-Minerals (CENTRUM SILVER PO) 1 tab po qd     . NEOMYCIN-POLYMYXIN-HYDROCORTISONE (CORTISPORIN) 1 % SOLN otic solution Place 3 drops into both ears as needed (place two drops into each ear as needed for itching).   3  . nitroGLYCERIN (NITROSTAT) 0.4 MG SL tablet Place 0.4 mg under the tongue every 5 (five) minutes as needed for chest pain.    . polyethylene glycol powder (GLYCOLAX/MIRALAX) powder  Take 17 g by mouth daily.    . ramipril (ALTACE) 2.5 MG capsule TAKE 1 CAPSULE (2.5 MG TOTAL) BY MOUTH 2 (TWO) TIMES DAILY. 180 capsule 6  . sertraline (ZOLOFT) 25 MG tablet Take 25 mg by mouth daily.      No current facility-administered medications for this visit.    Allergies:   Iohexol; Sulfonamide derivatives; Latex; and Tranilast   Social History:  The patient  reports that she quit smoking about 16 years ago. Her smoking use included Cigarettes. She has a 54 pack-year smoking history. She has never used smokeless tobacco. She reports that she does not drink alcohol or use illicit drugs.   Family History:  The patient's  family history includes Cancer in her father; Diabetes in her father; Hypertension in her paternal uncle; Parkinsonism in her mother; Prostate cancer in her father; Stroke in her paternal uncle. There is no history of Heart attack.    ROS:  Please see the history of present illness.  Otherwise, review of systems is positive for leg pain.  All other systems are reviewed and negative.    PHYSICAL EXAM: VS:  BP 100/62 mmHg  Pulse 72  Ht 5' 3.5" (1.613 m)  Wt 148 lb (67.132 kg)  BMI 25.80 kg/m2 , BMI Body mass index is 25.8 kg/(m^2). GEN: Well nourished, well developed, in no acute distress HEENT: normal Neck: no JVD, no masses. Right carotid bruit Cardiac: RRR without murmur or gallop                Respiratory:  clear to auscultation bilaterally, normal work of breathing GI: soft, nontender, nondistended, + BS MS: no deformity or atrophy Ext: no pretibial edema Skin: warm and dry, no rash Neuro:  Strength and sensation are intact Psych: euthymic mood, full affect  EKG:  EKG is ordered today. The ekg ordered today shows NSR 72 bpm, PAC's and PVC's present, age indeterminate inferior infarct, age-indeterminate anterolateral infarct  Recent Labs: No results found for requested labs within last 365 days.   Lipid Panel     Component Value Date/Time   CHOL  136 01/16/2012 0958   TRIG 154.0* 01/16/2012 0958   HDL 40.60 01/16/2012 0958   CHOLHDL 3 01/16/2012 0958   VLDL 30.8 01/16/2012 0958   LDLCALC 65 01/16/2012 0958      Wt Readings from Last 3 Encounters:  11/24/14 148 lb (67.132 kg)  08/29/14 147 lb (  66.679 kg)  04/15/14 151 lb (68.493 kg)     ASSESSMENT AND PLAN: 1.  CAD, native vessel: doing well without symptoms. Tolerating her medical program without side effects. No changes made today.  2. Hyperlipidemia: at goal as above on statin Rx  3. HTN: BP in ideal range. No lightheadedness or dizziness. Renal fxn stable. Renal arterial duplex reviewed and shows no evidence of renal artery stenosis.   Current medicines are reviewed with the patient today.  The patient does not have concerns regarding medicines.  Labs/ tests ordered today include:  Orders Placed This Encounter  Procedures  . EKG 12-Lead    Disposition:   FU 6 months  Signed, Sherren Mocha, MD  11/24/2014 4:17 PM    [facility] [place] [place], [place], [place]  [address] Phone: [phone]; Fax: [phone]

## 2014-12-09 ENCOUNTER — Other Ambulatory Visit: Payer: Self-pay | Admitting: Cardiovascular Disease

## 2014-12-16 DIAGNOSIS — J019 Acute sinusitis, unspecified: Secondary | ICD-10-CM | POA: Diagnosis not present

## 2014-12-29 DIAGNOSIS — Z124 Encounter for screening for malignant neoplasm of cervix: Secondary | ICD-10-CM | POA: Diagnosis not present

## 2014-12-29 DIAGNOSIS — Z01419 Encounter for gynecological examination (general) (routine) without abnormal findings: Secondary | ICD-10-CM | POA: Diagnosis not present

## 2015-02-28 DIAGNOSIS — E1129 Type 2 diabetes mellitus with other diabetic kidney complication: Secondary | ICD-10-CM | POA: Diagnosis not present

## 2015-02-28 DIAGNOSIS — I2510 Atherosclerotic heart disease of native coronary artery without angina pectoris: Secondary | ICD-10-CM | POA: Diagnosis not present

## 2015-02-28 DIAGNOSIS — I10 Essential (primary) hypertension: Secondary | ICD-10-CM | POA: Diagnosis not present

## 2015-02-28 DIAGNOSIS — E785 Hyperlipidemia, unspecified: Secondary | ICD-10-CM | POA: Diagnosis not present

## 2015-02-28 DIAGNOSIS — N183 Chronic kidney disease, stage 3 (moderate): Secondary | ICD-10-CM | POA: Diagnosis not present

## 2015-02-28 DIAGNOSIS — E119 Type 2 diabetes mellitus without complications: Secondary | ICD-10-CM | POA: Diagnosis not present

## 2015-03-02 DIAGNOSIS — Z6826 Body mass index (BMI) 26.0-26.9, adult: Secondary | ICD-10-CM | POA: Diagnosis not present

## 2015-03-02 DIAGNOSIS — I10 Essential (primary) hypertension: Secondary | ICD-10-CM | POA: Diagnosis not present

## 2015-03-02 DIAGNOSIS — E1129 Type 2 diabetes mellitus with other diabetic kidney complication: Secondary | ICD-10-CM | POA: Diagnosis not present

## 2015-03-02 DIAGNOSIS — E785 Hyperlipidemia, unspecified: Secondary | ICD-10-CM | POA: Diagnosis not present

## 2015-03-02 DIAGNOSIS — N183 Chronic kidney disease, stage 3 (moderate): Secondary | ICD-10-CM | POA: Diagnosis not present

## 2015-03-07 ENCOUNTER — Ambulatory Visit: Payer: Self-pay | Admitting: Family

## 2015-03-07 ENCOUNTER — Encounter (HOSPITAL_COMMUNITY): Payer: Self-pay

## 2015-03-17 ENCOUNTER — Encounter: Payer: Self-pay | Admitting: Family

## 2015-03-24 ENCOUNTER — Ambulatory Visit (HOSPITAL_COMMUNITY)
Admission: RE | Admit: 2015-03-24 | Discharge: 2015-03-24 | Disposition: A | Discharge status: Home / Self Care | Payer: Medicare Other | Source: Ambulatory Visit | Attending: Family | Admitting: Family

## 2015-03-24 ENCOUNTER — Ambulatory Visit (INDEPENDENT_AMBULATORY_CARE_PROVIDER_SITE_OTHER)
Admission: RE | Admit: 2015-03-24 | Discharge: 2015-03-24 | Disposition: A | Discharge status: Home / Self Care | Payer: Medicare Other | Source: Ambulatory Visit | Attending: Family | Admitting: Family

## 2015-03-24 ENCOUNTER — Encounter: Payer: Self-pay | Admitting: Family

## 2015-03-24 ENCOUNTER — Ambulatory Visit (INDEPENDENT_AMBULATORY_CARE_PROVIDER_SITE_OTHER): Payer: Medicare Other | Admitting: Family

## 2015-03-24 VITALS — BP 105/58 | HR 59 | Temp 97.0°F | Resp 14 | Ht 63.5 in | Wt 147.0 lb

## 2015-03-24 DIAGNOSIS — I10 Essential (primary) hypertension: Secondary | ICD-10-CM | POA: Insufficient documentation

## 2015-03-24 DIAGNOSIS — R0989 Other specified symptoms and signs involving the circulatory and respiratory systems: Secondary | ICD-10-CM

## 2015-03-24 DIAGNOSIS — I6523 Occlusion and stenosis of bilateral carotid arteries: Secondary | ICD-10-CM | POA: Diagnosis not present

## 2015-03-24 DIAGNOSIS — E785 Hyperlipidemia, unspecified: Secondary | ICD-10-CM | POA: Diagnosis not present

## 2015-03-24 DIAGNOSIS — I714 Abdominal aortic aneurysm, without rupture, unspecified: Secondary | ICD-10-CM

## 2015-03-24 DIAGNOSIS — Z87891 Personal history of nicotine dependence: Secondary | ICD-10-CM | POA: Insufficient documentation

## 2015-03-24 DIAGNOSIS — E119 Type 2 diabetes mellitus without complications: Secondary | ICD-10-CM | POA: Diagnosis not present

## 2015-03-24 NOTE — Patient Instructions (Signed)
Stroke Prevention Some medical conditions and behaviors are associated with an increased chance of having a stroke. You may prevent a stroke by making healthy choices and managing medical conditions. HOW CAN I REDUCE MY RISK OF HAVING A STROKE?   Stay physically active. Get at least 30 minutes of activity on most or all days.  Do not smoke. It may also be helpful to avoid exposure to secondhand smoke.  Limit alcohol use. Moderate alcohol use is considered to be:  No more than 2 drinks per day for men.  No more than 1 drink per day for nonpregnant women.  Eat healthy foods. This involves:  Eating 5 or more servings of fruits and vegetables a day.  Making dietary changes that address high blood pressure (hypertension), high cholesterol, diabetes, or obesity.  Manage your cholesterol levels.  Making food choices that are high in fiber and low in saturated fat, trans fat, and cholesterol may control cholesterol levels.  Take any prescribed medicines to control cholesterol as directed by your health care provider.  Manage your diabetes.  Controlling your carbohydrate and sugar intake is recommended to manage diabetes.  Take any prescribed medicines to control diabetes as directed by your health care provider.  Control your hypertension.  Making food choices that are low in salt (sodium), saturated fat, trans fat, and cholesterol is recommended to manage hypertension.  Ask your health care provider if you need treatment to lower your blood pressure. Take any prescribed medicines to control hypertension as directed by your health care provider.  If you are 18-39 years of age, have your blood pressure checked every 3-5 years. If you are 40 years of age or older, have your blood pressure checked every year.  Maintain a healthy weight.  Reducing calorie intake and making food choices that are low in sodium, saturated fat, trans fat, and cholesterol are recommended to manage  weight.  Stop drug abuse.  Avoid taking birth control pills.  Talk to your health care provider about the risks of taking birth control pills if you are over 35 years old, smoke, get migraines, or have ever had a blood clot.  Get evaluated for sleep disorders (sleep apnea).  Talk to your health care provider about getting a sleep evaluation if you snore a lot or have excessive sleepiness.  Take medicines only as directed by your health care provider.  For some people, aspirin or blood thinners (anticoagulants) are helpful in reducing the risk of forming abnormal blood clots that can lead to stroke. If you have the irregular heart rhythm of atrial fibrillation, you should be on a blood thinner unless there is a good reason you cannot take them.  Understand all your medicine instructions.  Make sure that other conditions (such as anemia or atherosclerosis) are addressed. SEEK IMMEDIATE MEDICAL CARE IF:   You have sudden weakness or numbness of the face, arm, or leg, especially on one side of the body.  Your face or eyelid droops to one side.  You have sudden confusion.  You have trouble speaking (aphasia) or understanding.  You have sudden trouble seeing in one or both eyes.  You have sudden trouble walking.  You have dizziness.  You have a loss of balance or coordination.  You have a sudden, severe headache with no known cause.  You have new chest pain or an irregular heartbeat. Any of these symptoms may represent a serious problem that is an emergency. Do not wait to see if the symptoms will   go away. Get medical help at once. Call your local emergency services (911 in [place] Do not drive yourself to the hospital.   This information is not intended to replace advice given to you by your health care provider. Make sure you discuss any questions you have with your health care provider.   Document Released: 03/07/2004 Document Revised: 02/18/2014 Document Reviewed:  07/31/2012 Elsevier Interactive Patient Education 2016 Elsevier Inc.  

## 2015-03-24 NOTE — Progress Notes (Signed)
[facility] [place] HISTORY AND PHYSICAL   MRN : [id number]  History of Present Illness:   Stacy [name] is a 82 y.o. female patient of Dr. Trula Slade. The patient comes back today for followup of her carotid artery occlusive disease, history of small aortic aneurysm, and decreased pedal pulses. Her pedal pulses were not palpable at her last visit, she has no claudication symptoms, no non healing wounds.  Her ABI's in January 2016 were normal with all triphasic waveforms, TBI's were below normal.  She has right sciatic pain, was taking acupuncture for this, states this has not helped.  She denies any history of stroke or TIA. She denies any new back or abdominal pain.  She had an MI in 1997, angioplasty, had a cardiac stent placed in 2000. Dr. Sherren Mocha is her cardiologist.  Her walking is limited by her right sciatic pain.   Pt Diabetic: Yes, A1C in January 2017 was 7.1, does have DM Pt smoker: former smoker, quit in 2000, denies exposure to second hand smoke  Pt meds include: Statin :Yes Betablocker: Yes ASA: Yes Other anticoagulants/antiplatelets: Plavix   Current Outpatient Prescriptions  Medication Sig Dispense Refill  . ACCU-CHEK SMARTVIEW test strip 1 each by Other route as needed for other (blood sugar test strips).   11  . aspirin 81 MG tablet Take 1 tablet (81 mg total) by mouth daily. 1 tablet   . cholecalciferol (VITAMIN D) 400 UNITS TABS Take 400 Units by mouth daily.     . clopidogrel (PLAVIX) 75 MG tablet Take 1 tablet (75 mg total) by mouth daily. 90 tablet 3  . Coenzyme Q10 (COQ-10 PO) Take 100 mg by mouth daily. 1 tab po qd    . conjugated estrogens (PREMARIN) vaginal cream 0.5 g once a week. .625 mg once weekly    . dexlansoprazole (DEXILANT) 60 MG capsule Take 1 tablet ( Dexilant DR ) by mouth once daily    . diphenhydrAMINE (BENADRYL) 25 mg capsule Take 25 mg by mouth as needed for allergies.     . hydrochlorothiazide (MICROZIDE)  12.5 MG capsule Take 1 capsule (12.5 mg total) by mouth daily. 90 capsule 3  . hydrocortisone 2.5 % cream Apply 1 application topically 2 (two) times daily.   1  . ibuprofen (ADVIL) 200 MG tablet Take 200 mg by mouth as needed for mild pain.     Marland Kitchen ketoconazole (NIZORAL) 2 % cream Apply topically 2 (two) times daily.  1  . Melatonin 5 MG CAPS Take 1 capsule by mouth at bedtime.    . metoprolol succinate (TOPROL-XL) 25 MG 24 hr tablet Take 1 tablet (25 mg total) by mouth daily. 90 tablet 3  . Multiple Vitamins-Minerals (CENTRUM SILVER PO) 1 tab po qd     . NEOMYCIN-POLYMYXIN-HYDROCORTISONE (CORTISPORIN) 1 % SOLN otic solution Place 3 drops into both ears as needed (place two drops into each ear as needed for itching).   3  . nitroGLYCERIN (NITROSTAT) 0.4 MG SL tablet Place 0.4 mg under the tongue every 5 (five) minutes as needed for chest pain.    . polyethylene glycol powder (GLYCOLAX/MIRALAX) powder Take 17 g by mouth daily.    . ramipril (ALTACE) 2.5 MG capsule TAKE 1 CAPSULE (2.5 MG TOTAL) BY MOUTH 2 (TWO) TIMES DAILY. 180 capsule 3  . rosuvastatin (CRESTOR) 10 MG tablet Take 1 tablet (10 mg total) by mouth daily. 90 tablet 3  . sertraline (ZOLOFT) 25 MG tablet Take 25 mg by  mouth daily.      No current facility-administered medications for this visit.    Past Medical History  Diagnosis Date  . Atheroembolism   . Peptic ulcer disease   . GERD (gastroesophageal reflux disease)   . Hyperlipidemia   . Renal artery stenosis ([place])   . Hemorrhoids   . Osteopenia   . DM (diabetes mellitus) ([place])   . CAD (coronary artery disease)   . Chronic kidney disease, stage III (moderate)   . Hypertension   . Myocardial infarction ([place]) 03/15/95  . Heart murmur   . Leg pain   . Hiatal hernia   . Arthritis     Cervical DDD C5-6 and C6-7  . Depression   . History of shingles   . DDD (degenerative disc disease)   . Blue toe syndrome ([place])   . Hemorrhoid   . Thyroid nodule   . Carotid artery  occlusion   . Cancer ([place])     skin BCC  Forehead and  SCC Left Lower Leg    Social History Social History  Substance Use Topics  . Smoking status: Former Smoker -- 1.00 packs/day for 54 years    Types: Cigarettes    Quit date: 10/05/1998  . Smokeless tobacco: Never Used  . Alcohol Use: No    Family History Family History  Problem Relation Age of Onset  . Diabetes Father   . Prostate cancer Father     meta to colon  . Cancer Father     Prostate  . Parkinsonism Mother   . Stroke Paternal Uncle   . Heart attack Neg Hx   . Hypertension Paternal Uncle     Surgical History Past Surgical History  Procedure Laterality Date  . Cataract extraction    . Tonsillectomy    . Ptca    . Appendectomy    . Hemorrhoid surgery    . Incisional hernia repair    . Basal cell cancer removal      forehead  . Dilation and curettage of uterus    . Endometrial biopsy    . Inguinal hernia repair    . Squamous cell carcinoma excision      left lower leg  . Coronary stent placement  2000  . Eye surgery      Allergies  Allergen Reactions  . Iohexol Other (See Comments)     Desc: HIVES- 13 HR PRE-MEDS ARE REQUIRED-ASM- 03/21/05,SULFA,ADHESIVE TAPE   . Sulfonamide Derivatives Hives    REACTION: hives  . Latex Other (See Comments)    Adhesive tape and EKG adhesive leads to rash  . Tranilast Hives    Investigational medication    Current Outpatient Prescriptions  Medication Sig Dispense Refill  . ACCU-CHEK SMARTVIEW test strip 1 each by Other route as needed for other (blood sugar test strips).   11  . aspirin 81 MG tablet Take 1 tablet (81 mg total) by mouth daily. 1 tablet   . cholecalciferol (VITAMIN D) 400 UNITS TABS Take 400 Units by mouth daily.     . clopidogrel (PLAVIX) 75 MG tablet Take 1 tablet (75 mg total) by mouth daily. 90 tablet 3  . Coenzyme Q10 (COQ-10 PO) Take 100 mg by mouth daily. 1 tab po qd    . conjugated estrogens (PREMARIN) vaginal cream 0.5 g once a week.  .625 mg once weekly    . dexlansoprazole (DEXILANT) 60 MG capsule Take 1 tablet ( Dexilant DR ) by mouth once daily    .  diphenhydrAMINE (BENADRYL) 25 mg capsule Take 25 mg by mouth as needed for allergies.     . hydrochlorothiazide (MICROZIDE) 12.5 MG capsule Take 1 capsule (12.5 mg total) by mouth daily. 90 capsule 3  . hydrocortisone 2.5 % cream Apply 1 application topically 2 (two) times daily.   1  . ibuprofen (ADVIL) 200 MG tablet Take 200 mg by mouth as needed for mild pain.     . ketoconazole (NIZORAL) 2 % cream Apply topically 2 (two) times daily.  1  . Melatonin 5 MG CAPS Take 1 capsule by mouth at bedtime.    . metoprolol succinate (TOPROL-XL) 25 MG 24 hr tablet Take 1 tablet (25 mg total) by mouth daily. 90 tablet 3  . Multiple Vitamins-Minerals (CENTRUM SILVER PO) 1 tab po qd     . NEOMYCIN-POLYMYXIN-HYDROCORTISONE (CORTISPORIN) 1 % SOLN otic solution Place 3 drops into both ears as needed (place two drops into each ear as needed for itching).   3  . nitroGLYCERIN (NITROSTAT) 0.4 MG SL tablet Place 0.4 mg under the tongue every 5 (five) minutes as needed for chest pain.    . polyethylene glycol powder (GLYCOLAX/MIRALAX) powder Take 17 g by mouth daily.    . ramipril (ALTACE) 2.5 MG capsule TAKE 1 CAPSULE (2.5 MG TOTAL) BY MOUTH 2 (TWO) TIMES DAILY. 180 capsule 3  . rosuvastatin (CRESTOR) 10 MG tablet Take 1 tablet (10 mg total) by mouth daily. 90 tablet 3  . sertraline (ZOLOFT) 25 MG tablet Take 25 mg by mouth daily.      No current facility-administered medications for this visit.     REVIEW OF SYSTEMS: See HPI for pertinent positives and negatives.  Physical Examination Filed Vitals:   03/24/15 1509 03/24/15 1512  BP: 116/66 105/58  Pulse: 56 59  Temp:  97 F (36.1 C)  TempSrc:  Oral  Resp:  14  Height:  5' 3.5" (1.613 m)  Weight:  147 lb (66.679 kg)  SpO2:  97%   Body mass index is 25.63 kg/(m^2).   General: WDWN in NAD Gait: Normal HENT: WNL Eyes: Pupils  equal Pulmonary: normal non-labored breathing , CTAB Cardiac: regular rate and rhythm, no murmur detected  Abdomen: soft, NT, no palpable masses Skin: no rashes, no ulcers, no cellulitis..   VASCULAR EXAM  Carotid Bruits Left Right   Negative Negative  Radial pulses: 2+ palpable and = Aorta is not palpable   VASCULAR EXAM: Extremities without ischemic changes  without Gangrene; without open wounds.     LE Pulses LEFT RIGHT   FEMORAL 2+ palpable not palpable    POPLITEAL not palpable  not palpable   POSTERIOR TIBIAL not palpable  not palpable    DORSALIS PEDIS  ANTERIOR TIBIAL  palpable  palpable     Musculoskeletal: no muscle wasting or atrophy; no peripheral edema Neurologic: A&O X 3; Appropriate Affect ;  SENSATION: normal; MOTOR FUNCTION: 5/5 Symmetric, CN 2-12 intact Speech is fluent/normal                Non-Invasive Vascular Imaging (03/24/2015):  CEREBROVASCULAR DUPLEX EVALUATION    INDICATION: Carotid artery disease    PREVIOUS INTERVENTION(S): None    DUPLEX EXAM: Carotid duplex    RIGHT  LEFT  Peak Systolic Velocities (cm/s) End Diastolic Velocities (cm/s) Plaque LOCATION Peak Systolic Velocities (cm/s) End Diastolic Velocities (cm/s) Plaque  83 14  CCA PROXIMAL 88 14   70 18  CCA MID 76 17   63 16 HT CCA DISTAL   79 19   219 44 HT ECA 86 8   489 116 HT ICA PROXIMAL 84 17   109 35  ICA MID 87 28   68 19  ICA DISTAL 106 25     6.9 ICA / CCA Ratio (PSV) 1.1  Antegrade Vertebral Flow Antegrade  485 Brachial Systolic Pressure (mmHg) 462  Triphasic Brachial Artery Waveforms Triphasic    Plaque Morphology:  HM = Homogeneous, HT = Heterogeneous, CP = Calcific Plaque, SP = Smooth Plaque, IP =  Irregular Plaque     ADDITIONAL FINDINGS: Multiphasic subclavian arteries    IMPRESSION: 1.80 - 99%  right internal carotid artery stenosis. 2. Less than 40% left internal carotid artery stenosis     Compared to the previous exam:  Increased velocity in the right internal carotid artery    ABI (Date: 03/24/2015)  R: 1.0 (1.2, 02/14/14), DP: triphasic, PT: triphasic, TBI: 0.56             L: 1.1 (1.2), DP: triphasic, PT: triphasic, TBI: 0.47     ASSESSMENT:  Stacy [name] is a 82 y.o. female who has no history of stroke or TIA. Today's carotid Duplex suggests 80 - 99% right internal carotid artery stenosis and less than 40% left internal carotid artery stenosis. Increased stenosis in the right ICA since prior exam. I discussed asymptomatic results with Dr. Trula Slade, agreed that pt needs cardiac risk stratification from Dr. Burt Knack prior to scheduling a right CEA.  08/29/14 AAA Duplex suggested no aneurysm or dilatation in the abdominal aorta or proximal common iliac arteries which was no change from prior Duplex.  Her femoral pulses and pedal pulses were not palpable six months ago, are somewhat palpable today. ABI's in January 2016 were normal. ABI's today remain normal with all triphasic waveforms, both TBI's are below normal.  Her atherosclerotic risk factors include DM (A1C of 7.1, January 2017) and former smoker (quit in 2000). She is on a statin, ASA, and Plavix.    PLAN:   Based on today's exam and non-invasive vascular lab results, the patient will follow up with Dr. Trula Slade at his first available office appointment to discuss possible right CEA and see her cardiologist, Dr. Burt Knack, for cardiac risk stratification for right carotid endarterectomy.  I discussed in depth with the patient the nature of atherosclerosis, and emphasized the importance of maximal medical management including strict control of blood pressure, blood glucose, and lipid levels, obtaining regular exercise,  and continued cessation of smoking.  The patient is aware that without maximal medical management the underlying atherosclerotic disease process will progress, limiting the benefit of any interventions.  The patient was given information about stroke prevention and what symptoms should prompt the patient to seek immediate medical care.  Thank you for allowing [place] to participate in this patient's care.  Clemon Chambers, RN, MSN, FNP-C [facility] Office: [phone]  Clinic MD: Bridgett Larsson  03/24/2015 2:26 PM

## 2015-03-27 ENCOUNTER — Telehealth: Payer: Self-pay | Admitting: Cardiovascular Disease

## 2015-03-27 ENCOUNTER — Other Ambulatory Visit: Payer: Self-pay

## 2015-03-27 ENCOUNTER — Ambulatory Visit (INDEPENDENT_AMBULATORY_CARE_PROVIDER_SITE_OTHER): Payer: Medicare Other | Admitting: Surgery

## 2015-03-27 ENCOUNTER — Encounter: Payer: Self-pay | Admitting: Surgery

## 2015-03-27 ENCOUNTER — Telehealth: Payer: Self-pay | Admitting: Surgery

## 2015-03-27 VITALS — BP 122/70 | HR 67 | Temp 98.2°F | Resp 16 | Ht 63.0 in | Wt 149.0 lb

## 2015-03-27 DIAGNOSIS — I6523 Occlusion and stenosis of bilateral carotid arteries: Secondary | ICD-10-CM

## 2015-03-27 NOTE — Progress Notes (Signed)
Patient name: Stacy [name] MRN: [id number] DOB: 1926/10/12 Sex: female     Chief Complaint  Patient presents with  . Re-evaluation    Pt here for Lab results  03-23-14  Carotid and ABI's    HISTORY OF PRESENT ILLNESS: The patient is back for follow-up of her carotid occlusive disease.  She is asymptomatic.  Specifically, she denies numbness or weakness in either extremity.  She denies slurred speech.  She denies amaurosis fugax.  She does complain of some pain over her right eye.  Her most recent ultrasound showed a progression of disease.  She is now greater than 80% on the right.  The patient has a history of coronary artery disease.  She has undergone percutaneous intervention in the past for MI.  She has diet-controlled diabetes.  She is on dual agent antiplatelet therapy with aspirin and Plavix.  Her hypercholesterolemia is managed with a statin.  Past Medical History  Diagnosis Date  . Atheroembolism   . Peptic ulcer disease   . GERD (gastroesophageal reflux disease)   . Hyperlipidemia   . Renal artery stenosis ([place])   . Hemorrhoids   . Osteopenia   . DM (diabetes mellitus) ([place])   . CAD (coronary artery disease)   . Chronic kidney disease, stage III (moderate)   . Hypertension   . Myocardial infarction ([place]) 03/15/95  . Heart murmur   . Leg pain   . Hiatal hernia   . Arthritis     Cervical DDD C5-6 and C6-7  . Depression   . History of shingles   . DDD (degenerative disc disease)   . Blue toe syndrome ([place])   . Hemorrhoid   . Thyroid nodule   . Carotid artery occlusion   . Cancer ([place])     skin BCC  Forehead and  SCC Left Lower Leg    Past Surgical History  Procedure Laterality Date  . Cataract extraction    . Tonsillectomy    . Ptca    . Appendectomy    . Hemorrhoid surgery    . Incisional hernia repair    . Basal cell cancer removal      forehead  . Dilation and curettage of uterus    . Endometrial biopsy    . Inguinal hernia repair    . Squamous  cell carcinoma excision      left lower leg  . Coronary stent placement  2000  . Eye surgery      Social History   Social History  . Marital Status: Widowed    Spouse Name: N/A  . Number of Children: 1  . Years of Education: N/A   Occupational History  . Retired     [employer]   Social History Main Topics  . Smoking status: Former Smoker -- 1.00 packs/day for 54 years    Types: Cigarettes    Quit date: 10/05/1998  . Smokeless tobacco: Never Used  . Alcohol Use: No  . Drug Use: No  . Sexual Activity: No   Other Topics Concern  . Not on file   Social History Narrative    Family History  Problem Relation Age of Onset  . Diabetes Father   . Prostate cancer Father     meta to colon  . Cancer Father     Prostate  . Parkinsonism Mother   . Stroke Paternal Uncle   . Heart attack Neg Hx   . Hypertension Paternal Uncle  Allergies as of 03/27/2015 - Review Complete 03/27/2015  Allergen Reaction Noted  . Iohexol Other (See Comments) 03/21/2005  . Sulfonamide derivatives Hives 06/20/2008  . Latex Other (See Comments) 10/18/2010  . Tranilast Hives 08/03/2012    Current Outpatient Prescriptions on File Prior to Visit  Medication Sig Dispense Refill  . ACCU-CHEK SMARTVIEW test strip 1 each by Other route as needed for other (blood sugar test strips).   11  . aspirin 81 MG tablet Take 1 tablet (81 mg total) by mouth daily. 1 tablet   . cholecalciferol (VITAMIN D) 400 UNITS TABS Take 400 Units by mouth daily.     . clopidogrel (PLAVIX) 75 MG tablet Take 1 tablet (75 mg total) by mouth daily. 90 tablet 3  . Coenzyme Q10 (COQ-10 PO) Take 100 mg by mouth daily. 1 tab po qd    . conjugated estrogens (PREMARIN) vaginal cream 0.5 g once a week. .625 mg once weekly    . dexlansoprazole (DEXILANT) 60 MG capsule Take 1 tablet ( Dexilant DR ) by mouth once daily    . diphenhydrAMINE (BENADRYL) 25 mg capsule Take 25 mg by mouth as needed for allergies.     .  hydrochlorothiazide (MICROZIDE) 12.5 MG capsule Take 1 capsule (12.5 mg total) by mouth daily. 90 capsule 3  . hydrocortisone 2.5 % cream Apply 1 application topically 2 (two) times daily.   1  . ibuprofen (ADVIL) 200 MG tablet Take 200 mg by mouth as needed for mild pain. Reported on 03/24/2015    . ketoconazole (NIZORAL) 2 % cream Apply topically 2 (two) times daily.  1  . Melatonin 5 MG CAPS Take 1 capsule by mouth at bedtime.    . metoprolol succinate (TOPROL-XL) 25 MG 24 hr tablet Take 1 tablet (25 mg total) by mouth daily. 90 tablet 3  . miconazole (ZEASORB-AF) 2 % powder Apply topically as needed for itching.    . Multiple Vitamins-Minerals (CENTRUM SILVER PO) 1 tab po qd     . NEOMYCIN-POLYMYXIN-HYDROCORTISONE (CORTISPORIN) 1 % SOLN otic solution Place 3 drops into both ears as needed (place two drops into each ear as needed for itching).   3  . nitroGLYCERIN (NITROSTAT) 0.4 MG SL tablet Place 0.4 mg under the tongue every 5 (five) minutes as needed for chest pain.    . polyethylene glycol powder (GLYCOLAX/MIRALAX) powder Take 17 g by mouth daily.    . ramipril (ALTACE) 2.5 MG capsule TAKE 1 CAPSULE (2.5 MG TOTAL) BY MOUTH 2 (TWO) TIMES DAILY. 180 capsule 3  . rosuvastatin (CRESTOR) 10 MG tablet Take 1 tablet (10 mg total) by mouth daily. 90 tablet 3  . sertraline (ZOLOFT) 25 MG tablet Take 25 mg by mouth daily.      No current facility-administered medications on file prior to visit.     REVIEW OF SYSTEMS: Cardiovascular: No chest pain, chest pressure, palpitations, orthopnea, or dyspnea on exertion. No claudication or rest pain,  No history of DVT or phlebitis. Pulmonary: No productive cough, asthma or wheezing. Neurologic: No weakness, paresthesias, aphasia, or amaurosis. No dizziness.  Right eye pain Hematologic: No bleeding problems or clotting disorders. Musculoskeletal: No joint pain or joint swelling. Gastrointestinal: No blood in stool or hematemesis Genitourinary: No  dysuria or hematuria. Psychiatric:: No history of major depression. Integumentary: No rashes or ulcers. Constitutional: No fever or chills.   PHYSICAL EXAMINATION:   Vital signs are  Filed Vitals:   03/27/15 1156 03/27/15 1158 03/27/15 1159  BP: 123/64 123/64  122/70  Pulse: 61 61 67  Temp:   98.2 F (36.8 C)  TempSrc:   Oral  Resp:   16  Height:   5\' 3"  (1.6 m)  Weight:   149 lb (67.586 kg)  SpO2:   97%   Body mass index is 26.4 kg/(m^2). General: The patient appears their stated age. HEENT:  No gross abnormalities Pulmonary:  Non labored breathing Musculoskeletal: There are no major deformities. Neurologic: No focal weakness or paresthesias are detected, Skin: There are no ulcer or rashes noted. Psychiatric: The patient has normal affect. Cardiovascular: There is a regular rate and rhythm without significant murmur appreciated.  Faint carotid bruit, right   Diagnostic Studies I have reviewed her carotid duplex which shows 80-99 percent right carotid stenosis and less than 40% left carotid stenosis.  The bifurcation was high  Assessment: Asymptomatic right carotid stenosis Plan: We discussed the options of intervention versus medical therapy.  The patient has elected to proceed with intervention.  Because of her age, I feel she would do better with carotid endarterectomy.  We discussed the risks and benefits of the operation including but not limited to the risk of stroke, nerve injury, and bleeding.  Because ultrasound indicated that the bifurcation was high, I am going to get a CT angiogram of the neck to make sure that the lesion is surgically accessible.  I placed on the operating room schedule for Thursday, March 2.  I'm going to stop her Plavix 5 days prior to her operation.  I will make sure Dr. Burt Knack is okay with taking her to the operating room from a cardiology perspective.  Eldridge Abrahams, M.D. [facility] Office:  [phone] Pager:  [phone]

## 2015-03-27 NOTE — Telephone Encounter (Signed)
Request for surgical clearance:  What type of surgery is being performed? RT CEA When is this surgery scheduled? UNKNOWN Are there any medications that need to be held prior to surgery and how long? NO Name of physician performing surgery? Dr.Brabham 1. What is your office phone and fax number? Fax [phone]   Juliann Pulse, wants to know if the last office visit can be used as cardiac clearance and can you place it in Epic  Pt is coming in at 12:00 and if it can be done by then

## 2015-03-27 NOTE — Telephone Encounter (Signed)
I spoke with the pt's daughter and arranged appointment tomorrow with Dr Burt Knack.

## 2015-03-27 NOTE — Addendum Note (Signed)
Addended by: Dorthula Rue L on: 03/27/2015 01:40 PM   Modules accepted: Orders

## 2015-03-27 NOTE — Telephone Encounter (Signed)
Stacy Mocha, MD  Barkley Boards, RN           Looks like [facility] would like her seen for preop risk stratification for CEA. Can you arrange with me or APP? thx

## 2015-03-27 NOTE — Telephone Encounter (Signed)
Spoke with pt. CTA 03/30/15 11:40 am No solids after 8am. Allergy med called into [facility]. Pt has appt with Dr. Burt Knack tomorrow afternoon. Pt verbalized understanding.

## 2015-03-28 ENCOUNTER — Encounter: Payer: Self-pay | Admitting: Cardiovascular Disease

## 2015-03-28 ENCOUNTER — Ambulatory Visit (INDEPENDENT_AMBULATORY_CARE_PROVIDER_SITE_OTHER): Payer: Medicare Other | Admitting: Cardiovascular Disease

## 2015-03-28 VITALS — BP 120/56 | HR 60 | Ht 63.0 in | Wt 150.0 lb

## 2015-03-28 DIAGNOSIS — I2510 Atherosclerotic heart disease of native coronary artery without angina pectoris: Secondary | ICD-10-CM

## 2015-03-28 DIAGNOSIS — I6523 Occlusion and stenosis of bilateral carotid arteries: Secondary | ICD-10-CM

## 2015-03-28 MED ORDER — NITROGLYCERIN 0.4 MG SL SUBL: 0.4000 mg | SUBLINGUAL_TABLET | SUBLINGUAL | Status: DC | PRN

## 2015-03-28 NOTE — Progress Notes (Signed)
Cardiology Office Note Date:  03/28/2015   ID:  Stacy [name], DOB 03/13/1926, MRN [id number]  PCP:  Stacy Anes, MD  Cardiologist:  Stacy Mocha, MD    Chief Complaint  Patient presents with  . Pre-op Exam    History of Present Illness: Stacy [name] is a 82 y.o. female who presents for preoperative cardiac evaluation. She initially presented in 1997 with an anterior MI treated with balloon angioplasty of the LAD. She subsequently underwent stenting of the right coronary artery in 2000. Because of recurrent symptoms, she underwent repeat heart catheterization in 2008 and this demonstrated patency of her previous angioplasty and stent sites.   the patient has been diagnosed with severe right internal carotid artery stenosis. She is asymptomatic but has undergone surveillance duplex ultrasonography for known disease. She is tentatively scheduled for right carotid endarterectomy with Dr. Trula [name]. She has a CT angiogram scheduled later this week to make certain that her anatomy is suitable. She presents today for preoperative evaluation.   the patient has no new cardiac complaints. She specifically denies chest pain, chest pressure, dyspnea, lightheadedness, heart palpitations, edema , orthopnea, or PND. She is compliant with her medications 10 denies any recent changes. She exercises at cardiac rehabilitation without symptoms.   Past Medical History  Diagnosis Date  . Atheroembolism   . Peptic ulcer disease   . GERD (gastroesophageal reflux disease)   . Hyperlipidemia   . Renal artery stenosis ([place])   . Hemorrhoids   . Osteopenia   . DM (diabetes mellitus) ([place])   . CAD (coronary artery disease)   . Chronic kidney disease, stage III (moderate)   . Hypertension   . Myocardial infarction ([place]) 03/15/95  . Heart murmur   . Leg pain   . Hiatal hernia   . Arthritis     Cervical DDD C5-6 and C6-7  . Depression   . History of shingles   . DDD (degenerative disc disease)   .  Blue toe syndrome ([place])   . Hemorrhoid   . Thyroid nodule   . Carotid artery occlusion   . Cancer ([place])     skin BCC  Forehead and  SCC Left Lower Leg    Past Surgical History  Procedure Laterality Date  . Cataract extraction    . Tonsillectomy    . Ptca    . Appendectomy    . Hemorrhoid surgery    . Incisional hernia repair    . Basal cell cancer removal      forehead  . Dilation and curettage of uterus    . Endometrial biopsy    . Inguinal hernia repair    . Squamous cell carcinoma excision      left lower leg  . Coronary stent placement  2000  . Eye surgery      Current Outpatient Prescriptions  Medication Sig Dispense Refill  . ACCU-CHEK SMARTVIEW test strip 1 each by Other route as needed for other (blood sugar test strips).   11  . aspirin 81 MG tablet Take 1 tablet (81 mg total) by mouth daily. 1 tablet   . cholecalciferol (VITAMIN D) 400 UNITS TABS Take 400 Units by mouth daily.     . clopidogrel (PLAVIX) 75 MG tablet Take 1 tablet (75 mg total) by mouth daily. 90 tablet 3  . Coenzyme Q10 (COQ-10 PO) Take 100 mg by mouth daily. 1 tab po qd    . conjugated estrogens (PREMARIN) vaginal cream 0.5 g once a week. Marland Kitchen  625 mg once weekly    . dexlansoprazole (DEXILANT) 60 MG capsule Take 1 tablet ( Dexilant DR ) by mouth once daily    . diphenhydrAMINE (BENADRYL) 25 mg capsule Take 25 mg by mouth as needed for allergies.     . diphenhydramine-acetaminophen (TYLENOL PM) 25-500 MG TABS tablet Take 1 tablet by mouth at bedtime as needed.    . hydrochlorothiazide (MICROZIDE) 12.5 MG capsule Take 1 capsule (12.5 mg total) by mouth daily. 90 capsule 3  . hydrocortisone 2.5 % cream Apply 1 application topically 2 (two) times daily.   1  . ibuprofen (ADVIL) 200 MG tablet Take 200 mg by mouth as needed for mild pain. Reported on 03/24/2015    . ketoconazole (NIZORAL) 2 % cream Apply topically 2 (two) times daily.  1  . Melatonin 5 MG CAPS Take 1 capsule by mouth at bedtime.    .  metoprolol succinate (TOPROL-XL) 25 MG 24 hr tablet Take 1 tablet (25 mg total) by mouth daily. 90 tablet 3  . miconazole (ZEASORB-AF) 2 % powder Apply topically as needed for itching.    . Multiple Vitamins-Minerals (CENTRUM SILVER PO) 1 tab po qd     . NEOMYCIN-POLYMYXIN-HYDROCORTISONE (CORTISPORIN) 1 % SOLN otic solution Place 3 drops into both ears as needed (place two drops into each ear as needed for itching).   3  . nitroGLYCERIN (NITROSTAT) 0.4 MG SL tablet Place 1 tablet (0.4 mg total) under the tongue every 5 (five) minutes as needed for chest pain. 25 tablet 2  . polyethylene glycol powder (GLYCOLAX/MIRALAX) powder Take 17 g by mouth daily.    . ramipril (ALTACE) 2.5 MG capsule TAKE 1 CAPSULE (2.5 MG TOTAL) BY MOUTH 2 (TWO) TIMES DAILY. 180 capsule 3  . rosuvastatin (CRESTOR) 10 MG tablet Take 1 tablet (10 mg total) by mouth daily. 90 tablet 3  . sertraline (ZOLOFT) 25 MG tablet Take 25 mg by mouth daily.      No current facility-administered medications for this visit.    Allergies:   Iohexol; Sulfonamide derivatives; Latex; and Tranilast   Social History:  The patient  reports that she quit smoking about 16 years ago. Her smoking use included Cigarettes. She has a 54 pack-year smoking history. She has never used smokeless tobacco. She reports that she does not drink alcohol or use illicit drugs.   Family History:  The patient's  family history includes Cancer in her father; Diabetes in her father; Hypertension in her paternal uncle; Parkinsonism in her mother; Prostate cancer in her father; Stroke in her paternal uncle. There is no history of Heart attack.    ROS:  Please see the history of present illness.  All other systems are reviewed and negative.    PHYSICAL EXAM: VS:  BP 120/56 mmHg  Pulse 60  Ht 5\' 3"  (1.6 m)  Wt 68.04 kg (150 lb)  BMI 26.58 kg/m2 , BMI Body mass index is 26.58 kg/(m^2). GEN: Well nourished, well developed, pleasant elderly woman in no acute  distress HEENT: normal Neck: no JVD, no masses. Right carotid bruit Cardiac: RRR without murmur or gallop                Respiratory:  clear to auscultation bilaterally, normal work of breathing GI: soft, nontender, nondistended, + BS MS: no deformity or atrophy Ext: no pretibial edema, pedal pulses 2+= bilaterally Skin: warm and dry, no rash Neuro:  Strength and sensation are intact Psych: euthymic mood, full affect  EKG:  EKG is ordered today. The ekg ordered today shows  Normal sinus rhythm 60 bpm , occasional PAC, right bundle branch block , inferior infarct age undetermined, anterolateral infarct age undetermined, no change from previous tracing 11/25/2014.  Recent Labs: No results found for requested labs within last 365 days.   Lipid Panel     Component Value Date/Time   CHOL 136 01/16/2012 0958   TRIG 154.0* 01/16/2012 0958   HDL 40.60 01/16/2012 0958   CHOLHDL 3 01/16/2012 0958   VLDL 30.8 01/16/2012 0958   LDLCALC 65 01/16/2012 0958      Wt Readings from Last 3 Encounters:  03/28/15 68.04 kg (150 lb)  03/27/15 67.586 kg (149 lb)  03/24/15 66.679 kg (147 lb)     Cardiac Studies Reviewed: Nuclear stress test 02/19/2012: QPS Raw Data Images: Normal; no motion artifact; normal heart/lung ratio. Stress Images: There is a large severe defect in the mid-distal anterior wall and apex. The uptake in the other regions is fairly normal.  Rest Images: There is a large severe defect in the mid-distal anterior wall and apex. The uptake in the other regions is fairly normal. Subtraction (SDS): No evidence of ischemia. There is a large anterior apical scar consistent with a previous MI.  Transient Ischemic Dilatation (Normal <1.22): 1.27 Lung/Heart Ratio (Normal <0.45): 0.44  Quantitative Gated Spect Images QGS EDV: 80 ml QGS ESV: 28 ml  Impression Exercise Capacity: Lexiscan with no exercise. BP Response: Normal blood pressure response. Clinical  Symptoms: No significant symptoms noted. ECG Impression: No significant ST segment change suggestive of ischemia. Comparison with Prior Nuclear Study: No images to compare  Overall Impression: Low risk stress nuclear study. She has had a previous anterior apical MI. This is not a new finding. There are no areas of ischemia.  LV Ejection Fraction: 65% by computer calculation. I suspect the actual EF is lower. LV Wall Motion: There is akinesis of the distal anterior wall and apex.  ASSESSMENT AND PLAN: 1.   CAD, native vessel, without symptoms of angina: patient is doing well and will continue her current medical program which is reviewed today.  2. Hyperlipidemia: she is tolerating Crestor 10 mg daily. Lipids have been followed closely and have been at goal with most recent LDL cholesterol 46.   3. Essential hypertension: blood pressure remains in an excellent range on hydrochlorothiazide , metoprolol succinate, and ramipril.  4. Preoperative cardiovascular evaluation: The patient can easily achieve greater than 4 mets without the cardiopulmonary symptoms. She has no signs or symptoms of heart failure. She has no angina. She is at low risk of proceeding with surgery without further testing indicated at this time. It is acceptable for her to hold Plavix 5-7 days prior to surgery if needed. She should continue on low-dose aspirin in the perioperative period.  Current medicines are reviewed with the patient today.  The patient does not have concerns regarding medicines.  Labs/ tests ordered today include:   Orders Placed This Encounter  Procedures  . EKG 12-Lead    Disposition:   FU 6 months  Signed, Stacy Mocha, MD  03/28/2015 5:04 PM    [facility] [place], [place],   [address] Phone: [phone]; Fax: [phone]

## 2015-03-28 NOTE — Patient Instructions (Signed)
Medication Instructions:  Your physician recommends that you continue on your current medications as directed. Please refer to the Current Medication list given to you today.  Labwork: No new orders.   Testing/Procedures: No new orders.   Follow-Up: Your physician wants you to follow-up in: 6 MONTHS with Dr Burt Knack.  You will receive a reminder letter in the mail two months in advance. If you don't receive a letter, please call our office to schedule the follow-up appointment.  You are cleared for vascular surgery.   Any Other Special Instructions Will Be Listed Below (If Applicable).     If you need a refill on your cardiac medications before your next appointment, please call your pharmacy.

## 2015-03-30 ENCOUNTER — Ambulatory Visit
Admission: RE | Admit: 2015-03-30 | Discharge: 2015-03-30 | Disposition: A | Discharge status: Home / Self Care | Payer: Medicare Other | Source: Ambulatory Visit | Attending: Surgery | Admitting: Surgery

## 2015-03-30 DIAGNOSIS — I6523 Occlusion and stenosis of bilateral carotid arteries: Secondary | ICD-10-CM | POA: Diagnosis not present

## 2015-03-30 MED ORDER — IOPAMIDOL (ISOVUE-370) INJECTION 76%: 100.0000 mL | Freq: Once | INTRAVENOUS | Status: DC | PRN

## 2015-04-04 NOTE — Pre-Procedure Instructions (Signed)
Stacy [name]  04/04/2015      [facility] [place], [place] [address] Phone: [phone] Fax: [phone]  [id number] [place], [address] [address] Phone: [phone] Fax: [phone]    Your procedure is scheduled on 04-13-2015  Thursday   Report to [facility] Admitting at 5:30 A.M.   Call this number if you have problems the morning of surgery:  [phone]   Remember:  Do not eat food or drink liquids after midnight.   Take these medicines the morning of surgery with A SIP OF WATER dexilant if needed,metoprolol(Toprol),rosuvastatin(Crestor),Sertraline(Zoloft)     Follow MD instructions for plavix             How to Manage Your Diabetes Before Surgery   Why is it important to control my blood sugar before and after surgery?   Improving blood sugar levels before and after surgery helps healing and can limit problems.  A way of improving blood sugar control is eating a healthy diet by:  - Eating less sugar and carbohydrates  - Increasing activity/exercise  - Talk with your doctor about reaching your blood sugar goals  High blood sugars (greater than 180 mg/dL) can raise your risk of infections and slow down your recovery so you will need to focus on controlling your diabetes during the weeks before surgery.  Make sure that the doctor who takes care of your diabetes knows about your planned surgery including the date and location.  How do I manage my blood sugars before surgery?   Check your blood sugar at least 4 times a day, 2 days before surgery to make sure that they are not too high or low.   Check your blood sugar the morning of your surgery when you wake up and every 2 hours until you get to the Short-Stay unit.  If your blood sugar is less than 70 mg/dL, you will need to treat for low blood  sugar by:  Treat a low blood sugar (less than 70 mg/dL) with 1/2 cup of clear juice (cranberry or apple), 4 glucose tablets, OR glucose gel.  Recheck blood sugar in 15 minutes after treatment (to make sure it is greater than 70 mg/dL).  If blood sugar is not greater than 70 mg/dL on re-check, call [phone] for further instructions.   Report your blood sugar to the Short-Stay nurse when you get to Short-Stay.  References:  [facility], 2007 "How to Manage your Diabetes Before and After Surgery".  What do I do about my diabetes medications?   Do not take oral diabetes medicines (pills) the morning of surgery                Do not wear jewelry, make-up or nail polish.  Do not wear lotions, powders, or perfumes.  You may not wear deodorant.  Do not shave 48 hours prior to surgery.  .  Do not bring valuables to the hospital.  [facility] is not responsible for any belongings or valuables.  Contacts, dentures or bridgework may not be worn into surgery.  Leave your suitcase in the car.  After surgery it may be brought to your room.  For patients admitted to the hospital, discharge time will be determined by your treatment team.  Patients discharged the day of surgery will  not be allowed to drive home.    Special instructions:  See attached Sheet for instructions on CHG showers  Please read over the following fact sheets that you were given. Pain Booklet, Blood Transfusion Information and Surgical Site Infection Prevention

## 2015-04-05 ENCOUNTER — Encounter (HOSPITAL_COMMUNITY)
Admission: RE | Admit: 2015-04-05 | Discharge: 2015-04-05 | Disposition: A | Discharge status: Home / Self Care | Payer: Medicare Other | Source: Ambulatory Visit | Attending: Surgery | Admitting: Surgery

## 2015-04-05 ENCOUNTER — Telehealth: Payer: Self-pay

## 2015-04-05 ENCOUNTER — Encounter (HOSPITAL_COMMUNITY): Payer: Self-pay

## 2015-04-05 DIAGNOSIS — Z0183 Encounter for blood typing: Secondary | ICD-10-CM | POA: Insufficient documentation

## 2015-04-05 DIAGNOSIS — Z7982 Long term (current) use of aspirin: Secondary | ICD-10-CM | POA: Diagnosis not present

## 2015-04-05 DIAGNOSIS — Z79899 Other long term (current) drug therapy: Secondary | ICD-10-CM | POA: Diagnosis not present

## 2015-04-05 DIAGNOSIS — Z01812 Encounter for preprocedural laboratory examination: Secondary | ICD-10-CM | POA: Diagnosis not present

## 2015-04-05 DIAGNOSIS — I6521 Occlusion and stenosis of right carotid artery: Secondary | ICD-10-CM | POA: Diagnosis not present

## 2015-04-05 HISTORY — DX: Unspecified urinary incontinence: R32

## 2015-04-05 HISTORY — DX: Calculus of gallbladder without cholecystitis without obstruction: K80.20

## 2015-04-05 HISTORY — DX: Anxiety disorder, unspecified: F41.9

## 2015-04-05 HISTORY — DX: Nontoxic multinodular goiter: E04.2

## 2015-04-05 LAB — COMPREHENSIVE METABOLIC PANEL
ALBUMIN: 4.1 g/dL (ref 3.5–5.0)
ALK PHOS: 55 U/L (ref 38–126)
ALT: 28 U/L (ref 14–54)
ANION GAP: 11 (ref 5–15)
AST: 31 U/L (ref 15–41)
BILIRUBIN TOTAL: 1 mg/dL (ref 0.3–1.2)
BUN: 11 mg/dL (ref 6–20)
CALCIUM: 9.4 mg/dL (ref 8.9–10.3)
CO2: 26 mmol/L (ref 22–32)
Chloride: 101 mmol/L (ref 101–111)
Creatinine, Ser: 0.88 mg/dL (ref 0.44–1.00)
GFR calc Af Amer: >60 mL/min (ref >60)
GFR calc non Af Amer: 58 mL/min — ABNORMAL LOW (ref >60)
GLUCOSE: 128 mg/dL — AB (ref 65–99)
POTASSIUM: 4 mmol/L (ref 3.5–5.1)
SODIUM: 138 mmol/L (ref 135–145)
Total Protein: 6.8 g/dL (ref 6.5–8.1)

## 2015-04-05 LAB — TYPE AND SCREEN
ABO/RH(D): O POS
ANTIBODY SCREEN: NEGATIVE

## 2015-04-05 LAB — CBC
HEMATOCRIT: 44.7 % (ref 36.0–46.0)
HEMOGLOBIN: 14.2 g/dL (ref 12.0–15.0)
MCH: 27.9 pg (ref 26.0–34.0)
MCHC: 31.8 g/dL (ref 30.0–36.0)
MCV: 87.8 fL (ref 78.0–100.0)
Platelets: 260 10*3/uL (ref 150–400)
RBC: 5.09 MIL/uL (ref 3.87–5.11)
RDW: 13.8 % (ref 11.5–15.5)
WBC: 8.7 10*3/uL (ref 4.0–10.5)

## 2015-04-05 LAB — PROTIME-INR
INR: 1.06 (ref 0.00–1.49)
Prothrombin Time: 14 seconds (ref 11.6–15.2)

## 2015-04-05 LAB — URINALYSIS, ROUTINE W REFLEX MICROSCOPIC
BILIRUBIN URINE: NEGATIVE
Glucose, UA: NEGATIVE mg/dL
HGB URINE DIPSTICK: NEGATIVE
Ketones, ur: NEGATIVE mg/dL
Leukocytes, UA: NEGATIVE
NITRITE: NEGATIVE
PROTEIN: NEGATIVE mg/dL
Specific Gravity, Urine: 1.004 — ABNORMAL LOW (ref 1.005–1.030)
pH: 6 (ref 5.0–8.0)

## 2015-04-05 LAB — GLUCOSE, CAPILLARY: Glucose-Capillary: 137 mg/dL — ABNORMAL HIGH (ref 65–99)

## 2015-04-05 LAB — APTT: APTT: 29 s (ref 24–37)

## 2015-04-05 LAB — SURGICAL PCR SCREEN
MRSA, PCR: NEGATIVE
STAPHYLOCOCCUS AUREUS: NEGATIVE

## 2015-04-05 LAB — ABO/RH: ABO/RH(D): O POS

## 2015-04-05 NOTE — Telephone Encounter (Signed)
-----   Message from Serafina Mitchell, MD sent at 04/04/2015  8:12 PM EST ----- Regarding: RE: Juluis Rainier to review CTA Neck from 03/30/15 I tried to call her but got her voice mail.  Can we let her know that her CT scan is ok to proceed with CEA.  Thanks ----- Message -----    From: Denman George, RN    Sent: 04/04/2015  12:14 PM      To: Serafina Mitchell, MD Subject: Juluis Rainier to review CTA Neck from 03/30/15            Her daughter called and said you wanted to be reminded to review the CTA neck on this pt.  She is scheduled for (R) CEA on 04/13/15.

## 2015-04-05 NOTE — Telephone Encounter (Signed)
Phone call to pt's. Daughter, Rodena Piety.  Advised that Dr. Trula Slade has reviewed the patient's CTA, and recommended to proceed with the (R) CEA on 04/13/15.  Pt's daughter verb. understanding.

## 2015-04-11 ENCOUNTER — Encounter (HOSPITAL_COMMUNITY): Payer: Self-pay | Admitting: Dentistry

## 2015-04-11 ENCOUNTER — Ambulatory Visit (HOSPITAL_COMMUNITY): Payer: Self-pay | Admitting: Dentistry

## 2015-04-11 VITALS — BP 103/55 | HR 59 | Temp 98.1°F

## 2015-04-11 DIAGNOSIS — K029 Dental caries, unspecified: Secondary | ICD-10-CM

## 2015-04-11 DIAGNOSIS — K0850 Unsatisfactory restoration of tooth, unspecified: Secondary | ICD-10-CM

## 2015-04-11 NOTE — Patient Instructions (Addendum)
Plan: 1. Patient to avoid eating until her local anesthetic wears off. 2. Patient to use salt water rinses as needed around the gum line as needed for discomfort. 3. Patient and daughter are aware of the potential need for a crown in the future if this tooth fractures or if the restoration needs to be replaced again. 4. Patient is currently cleared for carotid surgery with Dr. Trula Slade from a dental standpoint. Patient was dismissed to the care of her daughter in stable condition.  Lenn Cal, DDS

## 2015-04-11 NOTE — Progress Notes (Signed)
04/11/2015  Patient:            Stacy [name] Date of Birth:  09/30/1926 MRN:                [id number]   BP 103/55 mmHg  Pulse 59  Temp(Src) 98.1 F (36.7 C) (Oral)   Patient Active Problem List   Diagnosis Date Noted  . Insomnia 04/25/2012  . Pain in limb 12/09/2011  . HYPERLIPIDEMIA 06/18/2008  . RENAL ARTERY STENOSIS 06/18/2008  . GASTROESOPHAGEAL REFLUX DISEASE 06/18/2008  . PEPTIC ULCER DISEASE, HX OF 06/18/2008  . CATARACT EXTRACTIONS, BILATERAL, HX OF 06/18/2008  . Other acquired absence of organ 06/18/2008  . PERCUTANEOUS TRANSLUMINAL CORONARY ANGIOPLASTY, HX OF 06/18/2008  . Other postprocedural status(V45.89) 06/18/2008  . DM 05/16/2007  . HYPERTENSION 05/16/2007  . MYOCARDIAL INFARCTION, HX OF 05/16/2007  . Coronary atherosclerosis 05/16/2007  . CAROTID ARTERY DISEASE 05/16/2007  . ARTHRITIS 05/16/2007  . OSTEOPENIA 05/16/2007  . HEMORRHOIDS 11/18/2006   Past Medical History  Diagnosis Date  . Atheroembolism   . Peptic ulcer disease   . GERD (gastroesophageal reflux disease)   . Hyperlipidemia   . Hemorrhoids   . Osteopenia   . DM (diabetes mellitus) ([place])   . CAD (coronary artery disease)   . Hypertension   . Myocardial infarction ([place]) 03/15/95  . Heart murmur   . Leg pain   . Hiatal hernia   . Arthritis     Cervical DDD C5-6 and C6-7  . Depression   . History of shingles   . DDD (degenerative disc disease)   . Hemorrhoid   . Thyroid nodule   . Cancer ([place])     skin BCC  Forehead and  SCC Left Lower Leg  . Blue toe syndrome ([place])   . Carotid artery occlusion   . Multiple thyroid nodules     checked every year  . Anxiety   . Chronic kidney disease, stage III (moderate)   . Mild incontinence   . Gallstones    Past Surgical History  Procedure Laterality Date  . Cataract extraction    . Tonsillectomy    . Ptca    . Appendectomy    . Hemorrhoid surgery    . Incisional hernia repair    . Basal cell cancer removal      forehead  . Dilation  and curettage of uterus    . Endometrial biopsy    . Inguinal hernia repair    . Squamous cell carcinoma excision      left lower leg  . Coronary stent placement  2000  . Eye surgery    . Coronary angioplasty  03-15-1995  . Coronary angioplasty with stent placement  05-19-1998    Allergies  Allergen Reactions  . Iohexol Other (See Comments)     Desc: HIVES- 13 HR PRE-MEDS ARE REQUIRED-ASM- 03/21/05,SULFA,ADHESIVE TAPE   . Sulfonamide Derivatives Hives    REACTION: hives  . Latex Other (See Comments)    Adhesive tape and EKG adhesive leads to rash  . Tranilast Hives    Investigational medication   Current Outpatient Prescriptions  Medication Sig Dispense Refill  . ACCU-CHEK SMARTVIEW test strip 1 each by Other route as needed for other (blood sugar test strips).   11  . acetaminophen (TYLENOL) 325 MG tablet Take 325 mg by mouth every 6 (six) hours as needed for mild pain or moderate pain.    Marland Kitchen aspirin 81 MG tablet Take 1 tablet (81 mg total)  by mouth daily. 1 tablet   . cholecalciferol (VITAMIN D) 400 UNITS TABS Take 400 Units by mouth daily.     . clopidogrel (PLAVIX) 75 MG tablet Take 1 tablet (75 mg total) by mouth daily. (Patient not taking: Reported on 04/11/2015) 90 tablet 3  . Coenzyme Q10 (COQ-10 PO) Take 50 mg by mouth daily. 1 tab po qd    . conjugated estrogens (PREMARIN) vaginal cream 0.5 g once a week. .625 mg once weekly    . dexlansoprazole (DEXILANT) 60 MG capsule Take 60 mg by mouth daily as needed (for acid reflux). Take 1 tablet ( Dexilant DR ) by mouth once daily    . diphenhydrAMINE (BENADRYL) 25 mg capsule Take 25 mg by mouth as needed for allergies.     . diphenhydramine-acetaminophen (TYLENOL PM) 25-500 MG TABS tablet Take 1 tablet by mouth at bedtime as needed (for sleep).     . hydrochlorothiazide (MICROZIDE) 12.5 MG capsule Take 1 capsule (12.5 mg total) by mouth daily. 90 capsule 3  . hydrocortisone 2.5 % cream Apply 1 application topically daily as  needed (for itching).   1  . Melatonin 5 MG CAPS Take 1 capsule by mouth at bedtime as needed.     . metoprolol succinate (TOPROL-XL) 25 MG 24 hr tablet Take 1 tablet (25 mg total) by mouth daily. 90 tablet 3  . Multiple Vitamins-Minerals (CENTRUM SILVER PO) 1 tab po qd     . NEOMYCIN-POLYMYXIN-HYDROCORTISONE (CORTISPORIN) 1 % SOLN otic solution Place 3 drops into both ears as needed (place two drops into each ear as needed for itching).   3  . nitroGLYCERIN (NITROSTAT) 0.4 MG SL tablet Place 1 tablet (0.4 mg total) under the tongue every 5 (five) minutes as needed for chest pain. 25 tablet 2  . Polyethylene Glycol 400 (BLINK TEARS OP) Place 1 drop into both eyes daily as needed (for).    . polyethylene glycol powder (GLYCOLAX/MIRALAX) powder Take 17 g by mouth daily.    . ramipril (ALTACE) 2.5 MG capsule TAKE 1 CAPSULE (2.5 MG TOTAL) BY MOUTH 2 (TWO) TIMES DAILY. 180 capsule 3  . rosuvastatin (CRESTOR) 10 MG tablet Take 1 tablet (10 mg total) by mouth daily. 90 tablet 3  . sertraline (ZOLOFT) 25 MG tablet Take 25 mg by mouth daily.      No current facility-administered medications for this visit.   Stacy [name] is an 82 year old female presents with a history of dental caries and lost restoration from maxillary anterior tooth. Patient currently denies acute toothache symptoms. Patient is interested in having this tooth restored prior to and anticipated carotid surgery with Dr. Trula Slade on Thursday. Patient and daughter indicate that Dr. Trula Slade wished to have this dental procedure done before the surgery.  Exam: Constitutional: Patient is a well-developed, well-nourished female in no acute distress. Head and neck: There is no palpable submandibular lymphadenopathy. The patient denies acute TMJ symptoms.  Intraoral exam: The patient has normal saliva. There is no evidence of oral abscess formation. Patient is relatively good oral hygiene. Dentition: Tooth #7 has a lost restoration involving the  MIFL portion of the tooth. Periapical radiograph of tooth numbers 7 was obtained with no obvious periapical pathology or nerve involvement. Endodontic: No history of acute pulpitis symptoms. No obvious pedicle pathology or radiolucency. Occlusion: The occlusion is acceptable in this area.  I discussed the risks, benefits, and complication of various treatment options for the patient. Patient and daughter agree to dental restoration-today with evaluation  for a crown in the future if so desired.  Procedure: Infiltration area #7 with 18 mg of lidocaine with 0.009 mg of epinephrine. Cotton roll isolation was used. Dental caries and previous restoration were removed. Resin restoration was placed. Tooth #7 MIFL/Resin  AE, Primer, BA, Tetric Ceram Evo A2.0. [place] Occlusion is acceptable.  The patient accepts the aesthetics and function of this restoration. The patient tolerated the procedure well.  Plan: 1. Patient to avoid eating until her local anesthetic wears off. 2. Patient to use salt water rinses as needed around the gum line as needed for discomfort. 3. Patient and daughter are aware of the potential need for a crown in the future if this tooth fractures or if the restoration needs to be replaced again. 4. Patient is currently cleared for carotid surgery with Dr. Trula Slade from a dental standpoint. Patient was dismissed to the care of her daughter in stable condition.  Lenn Cal, DDS

## 2015-04-12 MED ORDER — DEXTROSE 5 % IV SOLN
1.5000 g | INTRAVENOUS | Status: AC
  Administered 2015-04-13: 1.5 g via INTRAVENOUS
  Filled 2015-04-12: qty 1.5

## 2015-04-12 MED ORDER — SODIUM CHLORIDE 0.9 % IV SOLN
INTRAVENOUS | Status: DC
Start: 2015-04-13 — End: 2015-04-13
  Administered 2015-04-13: 16:00:00 via INTRAVENOUS

## 2015-04-13 ENCOUNTER — Inpatient Hospital Stay (HOSPITAL_COMMUNITY)
Admission: RE | Admit: 2015-04-13 | Discharge: 2015-04-14 | DRG: 039 | Disposition: A | Discharge status: Home / Self Care | Payer: Medicare Other | Source: Ambulatory Visit | Attending: Surgery | Admitting: Surgery

## 2015-04-13 ENCOUNTER — Inpatient Hospital Stay (HOSPITAL_COMMUNITY): Payer: Medicare Other | Admitting: Certified Registered Nurse Anesthetist

## 2015-04-13 ENCOUNTER — Encounter (HOSPITAL_COMMUNITY): Payer: Self-pay | Admitting: Surgery

## 2015-04-13 ENCOUNTER — Encounter (HOSPITAL_COMMUNITY)
Admission: RE | Disposition: A | Discharge status: Home / Self Care | Payer: Self-pay | Source: Ambulatory Visit | Attending: Surgery

## 2015-04-13 DIAGNOSIS — Z87891 Personal history of nicotine dependence: Secondary | ICD-10-CM | POA: Diagnosis not present

## 2015-04-13 DIAGNOSIS — I6521 Occlusion and stenosis of right carotid artery: Secondary | ICD-10-CM | POA: Diagnosis not present

## 2015-04-13 DIAGNOSIS — I129 Hypertensive chronic kidney disease with stage 1 through stage 4 chronic kidney disease, or unspecified chronic kidney disease: Secondary | ICD-10-CM | POA: Diagnosis present

## 2015-04-13 DIAGNOSIS — R251 Tremor, unspecified: Secondary | ICD-10-CM | POA: Diagnosis not present

## 2015-04-13 DIAGNOSIS — Z7982 Long term (current) use of aspirin: Secondary | ICD-10-CM | POA: Diagnosis not present

## 2015-04-13 DIAGNOSIS — Z955 Presence of coronary angioplasty implant and graft: Secondary | ICD-10-CM | POA: Diagnosis not present

## 2015-04-13 DIAGNOSIS — E1122 Type 2 diabetes mellitus with diabetic chronic kidney disease: Secondary | ICD-10-CM | POA: Diagnosis present

## 2015-04-13 DIAGNOSIS — T50905A Adverse effect of unspecified drugs, medicaments and biological substances, initial encounter: Secondary | ICD-10-CM | POA: Diagnosis not present

## 2015-04-13 DIAGNOSIS — Z833 Family history of diabetes mellitus: Secondary | ICD-10-CM

## 2015-04-13 DIAGNOSIS — F329 Major depressive disorder, single episode, unspecified: Secondary | ICD-10-CM | POA: Diagnosis present

## 2015-04-13 DIAGNOSIS — Y92239 Unspecified place in hospital as the place of occurrence of the external cause: Secondary | ICD-10-CM | POA: Diagnosis not present

## 2015-04-13 DIAGNOSIS — Z79899 Other long term (current) drug therapy: Secondary | ICD-10-CM | POA: Diagnosis not present

## 2015-04-13 DIAGNOSIS — E785 Hyperlipidemia, unspecified: Secondary | ICD-10-CM | POA: Diagnosis present

## 2015-04-13 DIAGNOSIS — Z91041 Radiographic dye allergy status: Secondary | ICD-10-CM

## 2015-04-13 DIAGNOSIS — Z888 Allergy status to other drugs, medicaments and biological substances status: Secondary | ICD-10-CM

## 2015-04-13 DIAGNOSIS — Z91040 Latex allergy status: Secondary | ICD-10-CM | POA: Diagnosis not present

## 2015-04-13 DIAGNOSIS — Z9849 Cataract extraction status, unspecified eye: Secondary | ICD-10-CM

## 2015-04-13 DIAGNOSIS — I6529 Occlusion and stenosis of unspecified carotid artery: Secondary | ICD-10-CM | POA: Diagnosis present

## 2015-04-13 DIAGNOSIS — Z882 Allergy status to sulfonamides status: Secondary | ICD-10-CM | POA: Diagnosis not present

## 2015-04-13 DIAGNOSIS — N183 Chronic kidney disease, stage 3 (moderate): Secondary | ICD-10-CM | POA: Diagnosis present

## 2015-04-13 DIAGNOSIS — Z7902 Long term (current) use of antithrombotics/antiplatelets: Secondary | ICD-10-CM | POA: Diagnosis not present

## 2015-04-13 DIAGNOSIS — I2510 Atherosclerotic heart disease of native coronary artery without angina pectoris: Secondary | ICD-10-CM | POA: Diagnosis present

## 2015-04-13 DIAGNOSIS — M1990 Unspecified osteoarthritis, unspecified site: Secondary | ICD-10-CM | POA: Diagnosis not present

## 2015-04-13 DIAGNOSIS — I252 Old myocardial infarction: Secondary | ICD-10-CM

## 2015-04-13 HISTORY — PX: PATCH ANGIOPLASTY: SHX6230

## 2015-04-13 HISTORY — PX: ENDARTERECTOMY: SHX5162

## 2015-04-13 LAB — GLUCOSE, CAPILLARY
GLUCOSE-CAPILLARY: 164 mg/dL — AB (ref 65–99)
Glucose-Capillary: 153 mg/dL — ABNORMAL HIGH (ref 65–99)
Glucose-Capillary: 156 mg/dL — ABNORMAL HIGH (ref 65–99)

## 2015-04-13 SURGERY — ENDARTERECTOMY, CAROTID
Anesthesia: General | Site: Neck | Laterality: Right

## 2015-04-13 MED ORDER — GLYCOPYRROLATE 0.2 MG/ML IJ SOLN
INTRAMUSCULAR | Status: DC | PRN
  Administered 2015-04-13: 0.4 mg via INTRAVENOUS

## 2015-04-13 MED ORDER — SODIUM CHLORIDE 0.9 % IV SOLN
500.0000 mL | Freq: Once | INTRAVENOUS | Status: AC | PRN
  Administered 2015-04-13: 500 mL via INTRAVENOUS

## 2015-04-13 MED ORDER — POTASSIUM CHLORIDE CRYS ER 20 MEQ PO TBCR: 20.0000 meq | EXTENDED_RELEASE_TABLET | Freq: Every day | ORAL | Status: DC | PRN

## 2015-04-13 MED ORDER — SODIUM CHLORIDE 0.9 % IV SOLN
0.0125 ug/kg/min | INTRAVENOUS | Status: DC
  Administered 2015-04-13: .05 ug/kg/min via INTRAVENOUS
  Filled 2015-04-13: qty 2000

## 2015-04-13 MED ORDER — BISACODYL 10 MG RE SUPP: 10.0000 mg | Freq: Every day | RECTAL | Status: DC | PRN

## 2015-04-13 MED ORDER — ONDANSETRON HCL 4 MG/2ML IJ SOLN: 4.0000 mg | Freq: Four times a day (QID) | INTRAMUSCULAR | Status: DC | PRN

## 2015-04-13 MED ORDER — FENTANYL CITRATE (PF) 100 MCG/2ML IJ SOLN
25.0000 ug | INTRAMUSCULAR | Status: DC | PRN
  Administered 2015-04-13 (×4): 25 ug via INTRAVENOUS

## 2015-04-13 MED ORDER — ROCURONIUM BROMIDE 50 MG/5ML IV SOLN
INTRAVENOUS | Status: AC
  Filled 2015-04-13: qty 1

## 2015-04-13 MED ORDER — PANTOPRAZOLE SODIUM 40 MG PO TBEC
40.0000 mg | DELAYED_RELEASE_TABLET | Freq: Every day | ORAL | Status: DC
  Administered 2015-04-14: 40 mg via ORAL
  Filled 2015-04-13: qty 1

## 2015-04-13 MED ORDER — LACTATED RINGERS IV SOLN
INTRAVENOUS | Status: DC | PRN
  Administered 2015-04-13 (×2): via INTRAVENOUS

## 2015-04-13 MED ORDER — METOPROLOL SUCCINATE ER 25 MG PO TB24
25.0000 mg | ORAL_TABLET | Freq: Every day | ORAL | Status: DC
  Filled 2015-04-13: qty 1

## 2015-04-13 MED ORDER — CHLORHEXIDINE GLUCONATE CLOTH 2 % EX PADS: 6.0000 | MEDICATED_PAD | Freq: Once | CUTANEOUS | Status: DC

## 2015-04-13 MED ORDER — HYDRALAZINE HCL 20 MG/ML IJ SOLN: 5.0000 mg | INTRAMUSCULAR | Status: DC | PRN

## 2015-04-13 MED ORDER — LABETALOL HCL 5 MG/ML IV SOLN: 10.0000 mg | INTRAVENOUS | Status: DC | PRN

## 2015-04-13 MED ORDER — ROSUVASTATIN CALCIUM 10 MG PO TABS
10.0000 mg | ORAL_TABLET | Freq: Every day | ORAL | Status: DC
  Administered 2015-04-14: 10 mg via ORAL
  Filled 2015-04-13: qty 1

## 2015-04-13 MED ORDER — NEOSTIGMINE METHYLSULFATE 10 MG/10ML IV SOLN
INTRAVENOUS | Status: DC | PRN
  Administered 2015-04-13: 3 mg via INTRAVENOUS

## 2015-04-13 MED ORDER — SODIUM CHLORIDE 0.9 % IJ SOLN
INTRAMUSCULAR | Status: AC
  Filled 2015-04-13: qty 10

## 2015-04-13 MED ORDER — PHENYLEPHRINE HCL 10 MG/ML IJ SOLN
10.0000 mg | INTRAVENOUS | Status: DC | PRN
  Administered 2015-04-13: 20 ug/min via INTRAVENOUS

## 2015-04-13 MED ORDER — SUCCINYLCHOLINE CHLORIDE 20 MG/ML IJ SOLN
INTRAMUSCULAR | Status: AC
Start: 2015-04-13 — End: 2015-04-13
  Filled 2015-04-13: qty 1

## 2015-04-13 MED ORDER — LIDOCAINE HCL (CARDIAC) 20 MG/ML IV SOLN
INTRAVENOUS | Status: AC
  Filled 2015-04-13: qty 5

## 2015-04-13 MED ORDER — ASPIRIN EC 81 MG PO TBEC
81.0000 mg | DELAYED_RELEASE_TABLET | Freq: Every day | ORAL | Status: DC
  Administered 2015-04-14: 81 mg via ORAL
  Filled 2015-04-13: qty 1

## 2015-04-13 MED ORDER — GUAIFENESIN-DM 100-10 MG/5ML PO SYRP: 15.0000 mL | ORAL_SOLUTION | ORAL | Status: DC | PRN

## 2015-04-13 MED ORDER — ONDANSETRON HCL 4 MG/2ML IJ SOLN
INTRAMUSCULAR | Status: AC
  Filled 2015-04-13: qty 2

## 2015-04-13 MED ORDER — HYDROCORTISONE 2.5 % EX CREA: 1.0000 "application " | TOPICAL_CREAM | Freq: Every day | CUTANEOUS | Status: DC | PRN

## 2015-04-13 MED ORDER — HYDROCHLOROTHIAZIDE 12.5 MG PO CAPS
12.5000 mg | ORAL_CAPSULE | Freq: Every day | ORAL | Status: DC
  Administered 2015-04-13: 12.5 mg via ORAL
  Filled 2015-04-13 (×2): qty 1

## 2015-04-13 MED ORDER — STERILE WATER FOR INJECTION IJ SOLN
INTRAMUSCULAR | Status: AC
  Filled 2015-04-13: qty 10

## 2015-04-13 MED ORDER — FENTANYL CITRATE (PF) 100 MCG/2ML IJ SOLN
INTRAMUSCULAR | Status: DC | PRN
  Administered 2015-04-13: 100 ug via INTRAVENOUS
  Administered 2015-04-13: 50 ug via INTRAVENOUS

## 2015-04-13 MED ORDER — OXYCODONE HCL 5 MG PO TABS
5.0000 mg | ORAL_TABLET | Freq: Once | ORAL | Status: AC | PRN
  Administered 2015-04-13: 5 mg via ORAL

## 2015-04-13 MED ORDER — POLYETHYLENE GLYCOL 3350 17 G PO PACK
17.0000 g | PACK | Freq: Every day | ORAL | Status: DC
  Administered 2015-04-13 – 2015-04-14 (×2): 17 g via ORAL
  Filled 2015-04-13 (×2): qty 1

## 2015-04-13 MED ORDER — CLOPIDOGREL BISULFATE 75 MG PO TABS
75.0000 mg | ORAL_TABLET | Freq: Every day | ORAL | Status: DC
  Administered 2015-04-14: 75 mg via ORAL
  Filled 2015-04-13: qty 1

## 2015-04-13 MED ORDER — METOPROLOL TARTRATE 1 MG/ML IV SOLN: 2.0000 mg | INTRAVENOUS | Status: DC | PRN

## 2015-04-13 MED ORDER — TRAMADOL HCL 50 MG PO TABS: 50.0000 mg | ORAL_TABLET | Freq: Four times a day (QID) | ORAL | Status: DC | PRN

## 2015-04-13 MED ORDER — NITROGLYCERIN 0.4 MG SL SUBL: 0.4000 mg | SUBLINGUAL_TABLET | SUBLINGUAL | Status: DC | PRN

## 2015-04-13 MED ORDER — PROTAMINE SULFATE 10 MG/ML IV SOLN
INTRAVENOUS | Status: DC | PRN
Start: 2015-04-13 — End: 2015-04-13
  Administered 2015-04-13: 50 mg via INTRAVENOUS

## 2015-04-13 MED ORDER — DEXTROSE 5 % IV SOLN
1.5000 g | Freq: Two times a day (BID) | INTRAVENOUS | Status: AC
  Administered 2015-04-13 – 2015-04-14 (×2): 1.5 g via INTRAVENOUS
  Filled 2015-04-13 (×2): qty 1.5

## 2015-04-13 MED ORDER — EPHEDRINE SULFATE 50 MG/ML IJ SOLN
INTRAMUSCULAR | Status: AC
  Filled 2015-04-13: qty 1

## 2015-04-13 MED ORDER — FENTANYL CITRATE (PF) 100 MCG/2ML IJ SOLN
INTRAMUSCULAR | Status: AC
  Filled 2015-04-13: qty 2

## 2015-04-13 MED ORDER — OXYCODONE HCL 5 MG PO TABS
ORAL_TABLET | ORAL | Status: AC
  Filled 2015-04-13: qty 1

## 2015-04-13 MED ORDER — VECURONIUM BROMIDE 10 MG IV SOLR
INTRAVENOUS | Status: DC | PRN
  Administered 2015-04-13: 1 mg via INTRAVENOUS
  Administered 2015-04-13: 5 mg via INTRAVENOUS

## 2015-04-13 MED ORDER — POLYETHYLENE GLYCOL 3350 17 GM/SCOOP PO POWD
17.0000 g | Freq: Every day | ORAL | Status: DC
  Filled 2015-04-13: qty 255

## 2015-04-13 MED ORDER — FENTANYL CITRATE (PF) 250 MCG/5ML IJ SOLN
INTRAMUSCULAR | Status: AC
  Filled 2015-04-13: qty 5

## 2015-04-13 MED ORDER — SODIUM CHLORIDE 0.9 % IV SOLN
INTRAVENOUS | Status: DC | PRN
  Administered 2015-04-13: 09:00:00

## 2015-04-13 MED ORDER — RAMIPRIL 2.5 MG PO CAPS
2.5000 mg | ORAL_CAPSULE | Freq: Two times a day (BID) | ORAL | Status: DC
  Administered 2015-04-13 – 2015-04-14 (×2): 2.5 mg via ORAL
  Filled 2015-04-13 (×3): qty 1

## 2015-04-13 MED ORDER — MELATONIN 3 MG PO TABS
1.0000 | ORAL_TABLET | Freq: Every evening | ORAL | Status: DC | PRN
  Filled 2015-04-13: qty 1

## 2015-04-13 MED ORDER — PHENOL 1.4 % MT LIQD: 1.0000 | OROMUCOSAL | Status: DC | PRN

## 2015-04-13 MED ORDER — TRAMADOL HCL 50 MG PO TABS: 50.0000 mg | ORAL_TABLET | Freq: Three times a day (TID) | ORAL | Status: DC | PRN

## 2015-04-13 MED ORDER — INSULIN ASPART 100 UNIT/ML ~~LOC~~ SOLN
0.0000 [IU] | Freq: Three times a day (TID) | SUBCUTANEOUS | Status: DC
  Administered 2015-04-14: 1 [IU] via SUBCUTANEOUS

## 2015-04-13 MED ORDER — SODIUM CHLORIDE 0.9 % IV SOLN
INTRAVENOUS | Status: DC
  Administered 2015-04-13: 23:00:00 via INTRAVENOUS

## 2015-04-13 MED ORDER — PROTAMINE SULFATE 10 MG/ML IV SOLN
INTRAVENOUS | Status: AC
  Filled 2015-04-13: qty 5

## 2015-04-13 MED ORDER — DEXAMETHASONE SODIUM PHOSPHATE 10 MG/ML IJ SOLN
INTRAMUSCULAR | Status: DC | PRN
  Administered 2015-04-13: 4 mg via INTRAVENOUS

## 2015-04-13 MED ORDER — HEMOSTATIC AGENTS (NO CHARGE) OPTIME
TOPICAL | Status: DC | PRN
  Administered 2015-04-13: 1 via TOPICAL

## 2015-04-13 MED ORDER — CHOLECALCIFEROL 10 MCG (400 UNIT) PO TABS
400.0000 [IU] | ORAL_TABLET | Freq: Every day | ORAL | Status: DC
  Administered 2015-04-14: 400 [IU] via ORAL
  Filled 2015-04-13 (×2): qty 1

## 2015-04-13 MED ORDER — PROPOFOL 10 MG/ML IV BOLUS
INTRAVENOUS | Status: AC
  Filled 2015-04-13: qty 20

## 2015-04-13 MED ORDER — NEOMYCIN-POLYMYXIN-HC 1 % OT SOLN: 3.0000 [drp] | OTIC | Status: DC | PRN

## 2015-04-13 MED ORDER — SERTRALINE HCL 25 MG PO TABS
25.0000 mg | ORAL_TABLET | Freq: Every day | ORAL | Status: DC
Start: 2015-04-14 — End: 2015-04-14
  Administered 2015-04-14: 25 mg via ORAL
  Filled 2015-04-13: qty 1

## 2015-04-13 MED ORDER — HEPARIN SODIUM (PORCINE) 1000 UNIT/ML IJ SOLN
INTRAMUSCULAR | Status: DC | PRN
  Administered 2015-04-13: 7000 [IU] via INTRAVENOUS

## 2015-04-13 MED ORDER — DOCUSATE SODIUM 100 MG PO CAPS
100.0000 mg | ORAL_CAPSULE | Freq: Every day | ORAL | Status: DC
  Administered 2015-04-14: 100 mg via ORAL
  Filled 2015-04-13 (×2): qty 1

## 2015-04-13 MED ORDER — LIDOCAINE HCL (CARDIAC) 20 MG/ML IV SOLN
INTRAVENOUS | Status: DC | PRN
  Administered 2015-04-13: 50 mg via INTRAVENOUS

## 2015-04-13 MED ORDER — 0.9 % SODIUM CHLORIDE (POUR BTL) OPTIME
TOPICAL | Status: DC | PRN
  Administered 2015-04-13: 1000 mL

## 2015-04-13 MED ORDER — ALUM & MAG HYDROXIDE-SIMETH 200-200-20 MG/5ML PO SUSP: 15.0000 mL | ORAL | Status: DC | PRN

## 2015-04-13 MED ORDER — DIPHENHYDRAMINE HCL 25 MG PO CAPS: 25.0000 mg | ORAL_CAPSULE | ORAL | Status: DC | PRN

## 2015-04-13 MED ORDER — OXYCODONE HCL 5 MG/5ML PO SOLN: 5.0000 mg | Freq: Once | ORAL | Status: AC | PRN

## 2015-04-13 MED ORDER — LIDOCAINE HCL (PF) 1 % IJ SOLN
INTRAMUSCULAR | Status: AC
  Filled 2015-04-13: qty 30

## 2015-04-13 MED ORDER — ONDANSETRON HCL 4 MG/2ML IJ SOLN
INTRAMUSCULAR | Status: DC | PRN
  Administered 2015-04-13: 4 mg via INTRAVENOUS

## 2015-04-13 MED ORDER — PROPOFOL 10 MG/ML IV BOLUS
INTRAVENOUS | Status: DC | PRN
  Administered 2015-04-13: 100 mg via INTRAVENOUS

## 2015-04-13 MED ORDER — ACETAMINOPHEN 325 MG PO TABS: 325.0000 mg | ORAL_TABLET | Freq: Four times a day (QID) | ORAL | Status: DC | PRN

## 2015-04-13 SURGICAL SUPPLY — 52 items
CANISTER SUCTION 2500CC (MISCELLANEOUS) ×3 IMPLANT
CATH ROBINSON RED A/P 18FR (CATHETERS) ×1 IMPLANT
CATH SILICONE 5CC 18FR (INSTRUMENTS) ×2 IMPLANT
CATH SUCT 10FR WHISTLE TIP (CATHETERS) ×1 IMPLANT
CLIP TI MEDIUM 6 (CLIP) ×3 IMPLANT
CLIP TI WIDE RED SMALL 6 (CLIP) ×3 IMPLANT
CRADLE DONUT ADULT HEAD (MISCELLANEOUS) ×3 IMPLANT
DRAIN CHANNEL 15F RND FF W/TCR (WOUND CARE) IMPLANT
ELECT REM PT RETURN 9FT ADLT (ELECTROSURGICAL) ×3
ELECTRODE REM PT RTRN 9FT ADLT (ELECTROSURGICAL) ×1 IMPLANT
EVACUATOR SILICONE 100CC (DRAIN) IMPLANT
GAUZE SPONGE 4X4 12PLY STRL (GAUZE/BANDAGES/DRESSINGS) ×3 IMPLANT
GLOVE BIOGEL PI IND STRL 6 (GLOVE) IMPLANT
GLOVE BIOGEL PI IND STRL 6.5 (GLOVE) IMPLANT
GLOVE BIOGEL PI IND STRL 7.5 (GLOVE) ×1 IMPLANT
GLOVE BIOGEL PI INDICATOR 6 (GLOVE) ×4
GLOVE BIOGEL PI INDICATOR 6.5 (GLOVE) ×10
GLOVE BIOGEL PI INDICATOR 7.5 (GLOVE) ×2
GLOVE SS N UNI LF 6.5 STRL (GLOVE) ×2 IMPLANT
GLOVE SURG SS PI 6.0 STRL IVOR (GLOVE) ×4 IMPLANT
GLOVE SURG SS PI 6.5 STRL IVOR (GLOVE) ×8 IMPLANT
GLOVE SURG SS PI 7.5 STRL IVOR (GLOVE) ×3 IMPLANT
GOWN STRL REUS W/ TWL LRG LVL3 (GOWN DISPOSABLE) ×2 IMPLANT
GOWN STRL REUS W/ TWL XL LVL3 (GOWN DISPOSABLE) ×1 IMPLANT
GOWN STRL REUS W/TWL LRG LVL3 (GOWN DISPOSABLE) ×18
GOWN STRL REUS W/TWL XL LVL3 (GOWN DISPOSABLE) ×3
HEMOSTAT SNOW SURGICEL 2X4 (HEMOSTASIS) IMPLANT
INSERT FOGARTY SM (MISCELLANEOUS) IMPLANT
KIT BASIN OR (CUSTOM PROCEDURE TRAY) ×3 IMPLANT
KIT ROOM TURNOVER OR (KITS) ×3 IMPLANT
LIQUID BAND (GAUZE/BANDAGES/DRESSINGS) ×3 IMPLANT
NDL HYPO 25GX1X1/2 BEV (NEEDLE) IMPLANT
NEEDLE HYPO 25GX1X1/2 BEV (NEEDLE) IMPLANT
NS IRRIG 1000ML POUR BTL (IV SOLUTION) ×9 IMPLANT
PACK CAROTID (CUSTOM PROCEDURE TRAY) ×3 IMPLANT
PAD ARMBOARD 7.5X6 YLW CONV (MISCELLANEOUS) ×6 IMPLANT
PATCH VASC XENOSURE 1CMX6CM (Vascular Products) ×3 IMPLANT
PATCH VASC XENOSURE 1X6 (Vascular Products) IMPLANT
SHUNT CAROTID BYPASS 10 (VASCULAR PRODUCTS) IMPLANT
SHUNT CAROTID BYPASS 12FRX15.5 (VASCULAR PRODUCTS) IMPLANT
SPONGE INTESTINAL PEANUT (DISPOSABLE) ×3 IMPLANT
SUT ETHILON 3 0 PS 1 (SUTURE) IMPLANT
SUT PROLENE 6 0 BV (SUTURE) ×5 IMPLANT
SUT PROLENE 7 0 BV 1 (SUTURE) ×2 IMPLANT
SUT PROLENE 7 0 BV1 MDA (SUTURE) ×2 IMPLANT
SUT SILK 3 0 TIES 17X18 (SUTURE)
SUT SILK 3-0 18XBRD TIE BLK (SUTURE) IMPLANT
SUT VIC AB 3-0 SH 27 (SUTURE) ×6
SUT VIC AB 3-0 SH 27X BRD (SUTURE) ×2 IMPLANT
SUT VICRYL 4-0 PS2 18IN ABS (SUTURE) ×3 IMPLANT
SYR CONTROL 10ML LL (SYRINGE) IMPLANT
WATER STERILE IRR 1000ML POUR (IV SOLUTION) ×3 IMPLANT

## 2015-04-13 NOTE — Anesthesia Procedure Notes (Signed)
Procedure Name: Intubation Date/Time: 04/13/2015 7:47 AM Performed by: Rejeana Brock L Pre-anesthesia Checklist: Patient identified, Timeout performed, Emergency Drugs available, Suction available and Patient being monitored Patient Re-evaluated:Patient Re-evaluated prior to inductionOxygen Delivery Method: Circle system utilized Preoxygenation: Pre-oxygenation with 100% oxygen Intubation Type: IV induction Ventilation: Mask ventilation without difficulty Laryngoscope Size: Mac and 3 Grade View: Grade I Tube type: Oral Tube size: 7.0 mm Number of attempts: 1 Airway Equipment and Method: Stylet Placement Confirmation: ETT inserted through vocal cords under direct vision,  positive ETCO2 and breath sounds checked- equal and bilateral Secured at: 21 cm Tube secured with: Tape Dental Injury: Teeth and Oropharynx as per pre-operative assessment

## 2015-04-13 NOTE — Op Note (Signed)
Patient name: Stacy [name] MRN: [id number] DOB: 1926-04-03 Sex: female  04/13/2015 Pre-operative Diagnosis: Asymptomatic   right carotid stenosis Post-operative diagnosis:  Same Surgeon:  [name] Assistants:  S. Rhyne Procedure:    right carotid Endarterectomy with bovine pericardial  patch angioplasty Anesthesia:  General Blood Loss:  See anesthesia record Specimens:  Carotid Plaque to pathology  Findings:  80 %stenosis; Thrombus:  none  Indications:  82 year old with progressive asymptomatic right carotid stenosis, diagnosed with u/s and CTA  Procedure:  The patient was identified in the holding area and taken to [place] 11  The patient was then placed supine on the table.   General endotrachial anesthesia was administered.  The patient was prepped and draped in the usual sterile fashion.  A time out was called and antibiotics were administered.  The incision was made along the anterior border of the right sternocleidomastoid muscle.  Cautery was used to dissect through the subcutaneous tissue.  The platysma muscle was divided with cautery.  The internal jugular vein was exposed along its anterior medial border.  The common facial vein was exposed and then divided between 2-0 silk ties and metal clips.  The common carotid artery was then circumferentially exposed and encircled with an umbilical tape.  The vagus nerve was identified and protected.  Next sharp dissection was used to expose the external carotid artery and the superior thyroid artery.  The were encircled with a blue vessel loop and a 2-0 silk tie respectively.  Finally, the internal carotid was carefully dissected free.  An umbilical tape was placed around the internal carotid artery distal to the diseased segment.  The hypoglossal nerve was visualized throughout and protected.  The patient was given systemic heparinization.  A bovine carotid patch was selected and prepared on the back table.  A 10 french shunt was also  prepared.  After blood pressure readings were appropriate and the heparin had been given time to circulate, the internal carotid artery was occluded with a baby Gregory clamp.  The external and common carotid arteries were then occluded with vascular clamps and the 2-0 tie tightened on the superior thyroid artery.  A #11 blade was used to make an arteriotomy in the common carotid artery.  This was extended with Potts scissors along the anterior and lateral border of the common and internal carotid artery.  Approximately 80% stenosis was identified.  There was no thrombus identified.  The 10 french shunt was not placed because there was excellent back-bleeding from the internal carotid artery..  A kleiner kuntz elevator was used to perform endarterectomy.  An eversion endarterectomy was performed in the external carotid artery.  A good distal endpoint was obtained in the internal carotid artery.  The specimen was removed and sent to pathology.  Heparinized saline was used to irrigate the endarterectomized field.  All potential embolic debris was removed.  Bovine pericardial patch angioplasty was then performed using a running 6-0 Prolene.  The common internal and external carotid arteries were all appropriately flushed. The artery was again irrigated with heparin saline.  The anastomosis was then secured. The clamp was first released on the external carotid artery followed by the common carotid artery approximately 30 seconds later, bloodflow was reestablish through the internal carotid artery.  Next, a hand-held  Doppler was used to evaluate the signals in the common, external, and internal  carotid arteries, all of which had appropriate signals. I then administered  50 mg protamine. The  wound was then irrigated.  After hemostasis was achieved, the carotid sheath was reapproximated with 3-0 Vicryl. The  platysma muscle was reapproximated with running 3-0 Vicryl. The skin  was closed with 4-0 Vicryl. Dermabond  was placed on the skin. The  patient was then successfully extubated. Her neurologic exam was  similar to his preprocedural exam. The patient was then taken to recovery room  in stable condition. There were no complications.     Disposition:  To PACU in stable condition.  Relevant Operative Details:  Bifurcation in mid neck.  Facial vein did not require ligation.  No shunt required.  Theotis Burrow, M.D. [facility] Office: [phone] Pager:  [phone]

## 2015-04-13 NOTE — Progress Notes (Signed)
S.Rhyne PA at bedside to see pt. Slight mout pull to left unchanged. Art line SBP 86-90, cuff 90-100, NSR 60's. Additional 500cc NS bolus given as per PA. Will cont to monitor.

## 2015-04-13 NOTE — H&P (View-Only) (Signed)
Patient name: Stacy [name] MRN: [id number] DOB: [birth year] Sex: female     Chief Complaint  Patient presents with  . Re-evaluation    Pt here for Lab results  03-23-14  Carotid and ABI's    HISTORY OF PRESENT ILLNESS: The patient is back for follow-up of her carotid occlusive disease.  She is asymptomatic.  Specifically, she denies numbness or weakness in either extremity.  She denies slurred speech.  She denies amaurosis fugax.  She does complain of some pain over her right eye.  Her most recent ultrasound showed a progression of disease.  She is now greater than 80% on the right.  The patient has a history of coronary artery disease.  She has undergone percutaneous intervention in the past for MI.  She has diet-controlled diabetes.  She is on dual agent antiplatelet therapy with aspirin and Plavix.  Her hypercholesterolemia is managed with a statin.  Past Medical History  Diagnosis Date  . Atheroembolism   . Peptic ulcer disease   . GERD (gastroesophageal reflux disease)   . Hyperlipidemia   . Renal artery stenosis ([place])   . Hemorrhoids   . Osteopenia   . DM (diabetes mellitus) ([place])   . CAD (coronary artery disease)   . Chronic kidney disease, stage III (moderate)   . Hypertension   . Myocardial infarction ([place]) 03/15/95  . Heart murmur   . Leg pain   . Hiatal hernia   . Arthritis     Cervical DDD C5-6 and C6-7  . Depression   . History of shingles   . DDD (degenerative disc disease)   . Blue toe syndrome ([place])   . Hemorrhoid   . Thyroid nodule   . Carotid artery occlusion   . Cancer ([place])     skin BCC  Forehead and  SCC Left Lower Leg    Past Surgical History  Procedure Laterality Date  . Cataract extraction    . Tonsillectomy    . Ptca    . Appendectomy    . Hemorrhoid surgery    . Incisional hernia repair    . Basal cell cancer removal      forehead  . Dilation and curettage of uterus    . Endometrial biopsy    . Inguinal hernia repair    . Squamous  cell carcinoma excision      left lower leg  . Coronary stent placement  2000  . Eye surgery      Social History   Social History  . Marital Status: Widowed    Spouse Name: N/A  . Number of Children: 1  . Years of Education: N/A   Occupational History  . Retired     [employer]   Social History Main Topics  . Smoking status: Former Smoker -- 1.00 packs/day for 54 years    Types: Cigarettes    Quit date: 10/05/1998  . Smokeless tobacco: Never Used  . Alcohol Use: No  . Drug Use: No  . Sexual Activity: No   Other Topics Concern  . Not on file   Social History Narrative    Family History  Problem Relation Age of Onset  . Diabetes Father   . Prostate cancer Father     meta to colon  . Cancer Father     Prostate  . Parkinsonism Mother   . Stroke Paternal Uncle   . Heart attack Neg Hx   . Hypertension Paternal Uncle  Allergies as of 03/27/2015 - Review Complete 03/27/2015  Allergen Reaction Noted  . Iohexol Other (See Comments) 03/21/2005  . Sulfonamide derivatives Hives 06/20/2008  . Latex Other (See Comments) 10/18/2010  . Tranilast Hives 08/03/2012    Current Outpatient Prescriptions on File Prior to Visit  Medication Sig Dispense Refill  . ACCU-CHEK SMARTVIEW test strip 1 each by Other route as needed for other (blood sugar test strips).   11  . aspirin 81 MG tablet Take 1 tablet (81 mg total) by mouth daily. 1 tablet   . cholecalciferol (VITAMIN D) 400 UNITS TABS Take 400 Units by mouth daily.     . clopidogrel (PLAVIX) 75 MG tablet Take 1 tablet (75 mg total) by mouth daily. 90 tablet 3  . Coenzyme Q10 (COQ-10 PO) Take 100 mg by mouth daily. 1 tab po qd    . conjugated estrogens (PREMARIN) vaginal cream 0.5 g once a week. .625 mg once weekly    . dexlansoprazole (DEXILANT) 60 MG capsule Take 1 tablet ( Dexilant DR ) by mouth once daily    . diphenhydrAMINE (BENADRYL) 25 mg capsule Take 25 mg by mouth as needed for allergies.     .  hydrochlorothiazide (MICROZIDE) 12.5 MG capsule Take 1 capsule (12.5 mg total) by mouth daily. 90 capsule 3  . hydrocortisone 2.5 % cream Apply 1 application topically 2 (two) times daily.   1  . ibuprofen (ADVIL) 200 MG tablet Take 200 mg by mouth as needed for mild pain. Reported on 03/24/2015    . ketoconazole (NIZORAL) 2 % cream Apply topically 2 (two) times daily.  1  . Melatonin 5 MG CAPS Take 1 capsule by mouth at bedtime.    . metoprolol succinate (TOPROL-XL) 25 MG 24 hr tablet Take 1 tablet (25 mg total) by mouth daily. 90 tablet 3  . miconazole (ZEASORB-AF) 2 % powder Apply topically as needed for itching.    . Multiple Vitamins-Minerals (CENTRUM SILVER PO) 1 tab po qd     . NEOMYCIN-POLYMYXIN-HYDROCORTISONE (CORTISPORIN) 1 % SOLN otic solution Place 3 drops into both ears as needed (place two drops into each ear as needed for itching).   3  . nitroGLYCERIN (NITROSTAT) 0.4 MG SL tablet Place 0.4 mg under the tongue every 5 (five) minutes as needed for chest pain.    . polyethylene glycol powder (GLYCOLAX/MIRALAX) powder Take 17 g by mouth daily.    . ramipril (ALTACE) 2.5 MG capsule TAKE 1 CAPSULE (2.5 MG TOTAL) BY MOUTH 2 (TWO) TIMES DAILY. 180 capsule 3  . rosuvastatin (CRESTOR) 10 MG tablet Take 1 tablet (10 mg total) by mouth daily. 90 tablet 3  . sertraline (ZOLOFT) 25 MG tablet Take 25 mg by mouth daily.      No current facility-administered medications on file prior to visit.     REVIEW OF SYSTEMS: Cardiovascular: No chest pain, chest pressure, palpitations, orthopnea, or dyspnea on exertion. No claudication or rest pain,  No history of DVT or phlebitis. Pulmonary: No productive cough, asthma or wheezing. Neurologic: No weakness, paresthesias, aphasia, or amaurosis. No dizziness.  Right eye pain Hematologic: No bleeding problems or clotting disorders. Musculoskeletal: No joint pain or joint swelling. Gastrointestinal: No blood in stool or hematemesis Genitourinary: No  dysuria or hematuria. Psychiatric:: No history of major depression. Integumentary: No rashes or ulcers. Constitutional: No fever or chills.   PHYSICAL EXAMINATION:   Vital signs are  Filed Vitals:   03/27/15 1156 03/27/15 1158 03/27/15 1159  BP: 123/64 123/64  122/70  Pulse: 61 61 67  Temp:   98.2 F (36.8 C)  TempSrc:   Oral  Resp:   16  Height:   5\' 3"  (1.6 m)  Weight:   149 lb (67.586 kg)  SpO2:   97%   Body mass index is 26.4 kg/(m^2). General: The patient appears their stated age. HEENT:  No gross abnormalities Pulmonary:  Non labored breathing Musculoskeletal: There are no major deformities. Neurologic: No focal weakness or paresthesias are detected, Skin: There are no ulcer or rashes noted. Psychiatric: The patient has normal affect. Cardiovascular: There is a regular rate and rhythm without significant murmur appreciated.  Faint carotid bruit, right   Diagnostic Studies I have reviewed her carotid duplex which shows 80-99 percent right carotid stenosis and less than 40% left carotid stenosis.  The bifurcation was high  Assessment: Asymptomatic right carotid stenosis Plan: We discussed the options of intervention versus medical therapy.  The patient has elected to proceed with intervention.  Because of her age, I feel she would do better with carotid endarterectomy.  We discussed the risks and benefits of the operation including but not limited to the risk of stroke, nerve injury, and bleeding.  Because ultrasound indicated that the bifurcation was high, I am going to get a CT angiogram of the neck to make sure that the lesion is surgically accessible.  I placed on the operating room schedule for Thursday, March 2.  I'm going to stop her Plavix 5 days prior to her operation.  I will make sure Dr. Burt Knack is okay with taking her to the operating room from a cardiology perspective.  Eldridge Abrahams, M.D. [facility] Office:  [phone] Pager:  [phone]

## 2015-04-13 NOTE — Progress Notes (Signed)
Pt neuro. intact, mentating clearly, equal strength in all extremities, tongue midline, has sl. pull of mouth to left with grin. Zoila Shutter PA in OR with Dr Trula Slade updated, no new orders. Will cont. to monitor.

## 2015-04-13 NOTE — Interval H&P Note (Signed)
History and Physical Interval Note:  04/13/2015 7:24 AM  Stacy [name]  has presented today for surgery, with the diagnosis of Right carotid artery stenosis I65.21  The various methods of treatment have been discussed with the patient and family. After consideration of risks, benefits and other options for treatment, the patient has consented to  Procedure(s): ENDARTERECTOMY CAROTID (Right) as a surgical intervention .  The patient's history has been reviewed, patient examined, no change in status, stable for surgery.  I have reviewed the patient's chart and labs.  Questions were answered to the patient's satisfaction.     [name]

## 2015-04-13 NOTE — Progress Notes (Signed)
Lunch relief by Hillery Hunter RN

## 2015-04-13 NOTE — Transfer of Care (Signed)
Immediate Anesthesia Transfer of Care Note  Patient: Stacy [name]  Procedure(s) Performed: Procedure(s): ENDARTERECTOMY CAROTID (Right) PATCH ANGIOPLASTY (Right)  Patient Location: PACU  Anesthesia Type:General  Level of Consciousness: awake and alert   Airway & Oxygen Therapy: Patient Spontanous Breathing and Patient connected to nasal cannula oxygen  Post-op Assessment: Report given to RN, Post -op Vital signs reviewed and stable, Patient moving all extremities X 4 and Patient able to stick tongue midline  Post vital signs: stable  Last Vitals:  Filed Vitals:   04/13/15 0559  BP: 104/51  Pulse: 97  Temp: 36.2 C  Resp: 18    Complications: No apparent anesthesia complications

## 2015-04-13 NOTE — Progress Notes (Signed)
  Day of Surgery Note    Subjective:  No complaints-awake and alert  Filed Vitals:   04/13/15 1502 04/13/15 1515  BP: 90/49   Pulse:  71  Temp:    Resp:  16    Incisions:   Clean and dry without hematoma Extremities:  Moving all extremities equally Lungs:  Non labored Neuro:  In tact; tongue is midline; smile symmetric    Assessment/Plan:  This is a 82 y.o. female who is s/p  right carotid Endarterectomy with bovine pericardial patch angioplasty  -pt doing well this afternoon and is neurologically in tact -her BP is soft and running around 90 systolic.  She has received a 500cc fluid bolus.  Will give another 37cc bolus-her creatinine is okay and no hx of CHF. -to [place] when bed available -anticipate discharge tomorrow if she has uneventful evening.   Leontine Locket, PA-C 04/13/2015 3:32 PM

## 2015-04-13 NOTE — Anesthesia Preprocedure Evaluation (Signed)
Anesthesia Evaluation  Patient identified by MRN, date of birth, ID band Patient awake    Reviewed: Allergy & Precautions, NPO status , Patient's Chart, lab work & pertinent test results  Airway Mallampati: II   Neck ROM: full    Dental   Pulmonary former smoker,    breath sounds clear to auscultation       Cardiovascular hypertension, + CAD, + Past MI, + Cardiac Stents and + Peripheral Vascular Disease   Rhythm:regular Rate:Normal  Cleared by cardiology.   Neuro/Psych Anxiety Depression  Neuromuscular disease    GI/Hepatic hiatal hernia, PUD, GERD  ,  Endo/Other  diabetes, Type 2  Renal/GU Renal disease     Musculoskeletal  (+) Arthritis ,   Abdominal   Peds  Hematology   Anesthesia Other Findings   Reproductive/Obstetrics                             Anesthesia Physical Anesthesia Plan  ASA: III  Anesthesia Plan: General   Post-op Pain Management:    Induction: Intravenous  Airway Management Planned: Oral ETT  Additional Equipment: Arterial line  Intra-op Plan:   Post-operative Plan: Extubation in OR  Informed Consent: I have reviewed the patients History and Physical, chart, labs and discussed the procedure including the risks, benefits and alternatives for the proposed anesthesia with the patient or authorized representative who has indicated his/her understanding and acceptance.     Plan Discussed with: CRNA, Anesthesiologist and Surgeon  Anesthesia Plan Comments:         Anesthesia Quick Evaluation

## 2015-04-14 ENCOUNTER — Encounter (HOSPITAL_COMMUNITY): Payer: Self-pay | Admitting: Surgery

## 2015-04-14 LAB — CBC
HEMATOCRIT: 35.6 % — AB (ref 36.0–46.0)
HEMOGLOBIN: 11.2 g/dL — AB (ref 12.0–15.0)
MCH: 27.7 pg (ref 26.0–34.0)
MCHC: 31.5 g/dL (ref 30.0–36.0)
MCV: 88.1 fL (ref 78.0–100.0)
Platelets: 171 10*3/uL (ref 150–400)
RBC: 4.04 MIL/uL (ref 3.87–5.11)
RDW: 14.1 % (ref 11.5–15.5)
WBC: 11.2 10*3/uL — ABNORMAL HIGH (ref 4.0–10.5)

## 2015-04-14 LAB — BASIC METABOLIC PANEL
Anion gap: 9 (ref 5–15)
BUN: 12 mg/dL (ref 6–20)
CHLORIDE: 108 mmol/L (ref 101–111)
CO2: 24 mmol/L (ref 22–32)
CREATININE: 0.96 mg/dL (ref 0.44–1.00)
Calcium: 8.2 mg/dL — ABNORMAL LOW (ref 8.9–10.3)
GFR calc Af Amer: >60 mL/min (ref >60)
GFR calc non Af Amer: 52 mL/min — ABNORMAL LOW (ref >60)
GLUCOSE: 136 mg/dL — AB (ref 65–99)
POTASSIUM: 4 mmol/L (ref 3.5–5.1)
Sodium: 141 mmol/L (ref 135–145)

## 2015-04-14 LAB — GLUCOSE, CAPILLARY: GLUCOSE-CAPILLARY: 131 mg/dL — AB (ref 65–99)

## 2015-04-14 NOTE — Progress Notes (Signed)
  Progress Note    04/14/2015 7:25 AM 1 Day Post-Op  Subjective:  No complaints; ready to go home  afebrile HR 60's-70's NSR [id number] systolic  Filed Vitals:   04/14/15 0400 04/14/15 0600  BP: 98/51 106/57  Pulse: 64 63  Temp: 98.5 F (36.9 C)   Resp: 19 15     Physical Exam: Neuro:  In tact; tongue is midline Lungs:  Non labored Incision:  Clean and dry without hematoma  CBC    Component Value Date/Time   WBC 11.2* 04/14/2015 0430   RBC 4.04 04/14/2015 0430   HGB 11.2* 04/14/2015 0430   HCT 35.6* 04/14/2015 0430   PLT 171 04/14/2015 0430   MCV 88.1 04/14/2015 0430   MCH 27.7 04/14/2015 0430   MCHC 31.5 04/14/2015 0430   RDW 14.1 04/14/2015 0430   LYMPHSABS 2.2 01/16/2012 0958   MONOABS 0.5 01/16/2012 0958   EOSABS 0.3 01/16/2012 0958   BASOSABS 0.0 01/16/2012 0958    BMET    Component Value Date/Time   NA 141 04/14/2015 0430   K 4.0 04/14/2015 0430   CL 108 04/14/2015 0430   CO2 24 04/14/2015 0430   GLUCOSE 136* 04/14/2015 0430   BUN 12 04/14/2015 0430   CREATININE 0.96 04/14/2015 0430   CALCIUM 8.2* 04/14/2015 0430   GFRNONAA 52* 04/14/2015 0430   GFRAA >60 04/14/2015 0430     Intake/Output Summary (Last 24 hours) at 04/14/15 0725 Last data filed at 04/14/15 0622  Gross per 24 hour  Intake 4136.25 ml  Output   1290 ml  Net 2846.25 ml     Assessment/Plan:  This is a 82 y.o. female who is s/p right CEA 1 Day Post-Op  -pt is doing well this am. -she did c/o a tremor in bilateral hands, which is most likely medication related and not related to her endarterectomy given it was bilateral. -BP is soft running [id number] systolic, however, this is at baseline when she was admitted yesterday. -pt neuro exam is in tact -pt has ambulated -pt has voided -f/u with Dr. Trula Slade in 2 weeks.   Leontine Locket, PA-C [facility] [phone]      I agree with the above.  She is postoperative day 1 for right carotid endarterectomy.   She is neurologically intact.  She has minimal pain.  Her incision is clean.  She is stable for discharge.  I'll see her back in the office in 2 weeks.  [name]

## 2015-04-14 NOTE — Progress Notes (Signed)
DC instructions given to pt at this time.  Verbalized understanding of all instructions.  No ss of any acute distress.  Denies pain. IVs dc'd.  Tele dc'd. Central monitoring notified.  Waiting on ride.

## 2015-04-14 NOTE — Discharge Summary (Signed)
Discharge Summary     Stacy [name] Jul 13, 1926 82 y.o. female  [id number]  Admission Date: 04/13/2015  Discharge Date: 04/14/15  Physician: Serafina Mitchell, MD  Admission Diagnosis: Right carotid artery stenosis I65.21   HPI:   This is a 82 y.o. female patient is back for follow-up of her carotid occlusive disease. She is asymptomatic. Specifically, she denies numbness or weakness in either extremity. She denies slurred speech. She denies amaurosis fugax. She does complain of some pain over her right eye. Her most recent ultrasound showed a progression of disease. She is now greater than 80% on the right.  The patient has a history of coronary artery disease. She has undergone percutaneous intervention in the past for MI. She has diet-controlled diabetes. She is on dual agent antiplatelet therapy with aspirin and Plavix. Her hypercholesterolemia is managed with a statin.  Hospital Course:  The patient was admitted to the hospital and taken to the operating room on 04/13/2015 and underwent right carotid endarterectomy.  The pt tolerated the procedure well and was transported to the PACU in good condition.  In PACU, her BP was soft and did require two 500cc fluid boluses.  Her blood pressure at discharge was still soft with [id number] systolic, however, this was her baseline when she was admitted.   By POD 1, the pt neuro status was in tact.  She did have a slight tremor of both hands, which is most likely from anesthesia or medication and not from the endarterectomy given it is bilaterally.  The remainder of the hospital course consisted of increasing mobilization and increasing intake of solids without difficulty.    Recent Labs  04/14/15 0430  NA 141  K 4.0  CL 108  CO2 24  GLUCOSE 136*  BUN 12  CALCIUM 8.2*    Recent Labs  04/14/15 0430  WBC 11.2*  HGB 11.2*  HCT 35.6*  PLT 171   No results for input(s): INR in the last 72 hours.   Discharge Instructions      CAROTID [place]: Call MD for difficulty swallowing or speaking; weakness in arms or legs that is a new symtom; severe headache.  If you have increased swelling in the neck and/or  are having difficulty breathing, CALL 911    Complete by:  As directed      Call MD for:  redness, tenderness, or signs of infection (pain, swelling, bleeding, redness, odor or green/yellow discharge around incision site)    Complete by:  As directed      Call MD for:  severe or increased pain, loss or decreased feeling  in affected limb(s)    Complete by:  As directed      Call MD for:  temperature >100.5    Complete by:  As directed      Discharge wound care:    Complete by:  As directed   Shower daily with soap and water starting 04/15/15     Driving Restrictions    Complete by:  As directed   No driving for 2 weeks     Lifting restrictions    Complete by:  As directed   No lifting for 2 weeks     Resume previous diet    Complete by:  As directed            Discharge Diagnosis:  Right carotid artery stenosis I65.21  Secondary Diagnosis: Patient Active Problem List   Diagnosis Date Noted  . Carotid artery stenosis 04/13/2015  .  Insomnia 04/25/2012  . Pain in limb 12/09/2011  . HYPERLIPIDEMIA 06/18/2008  . RENAL ARTERY STENOSIS 06/18/2008  . GASTROESOPHAGEAL REFLUX DISEASE 06/18/2008  . PEPTIC ULCER DISEASE, HX OF 06/18/2008  . CATARACT EXTRACTIONS, BILATERAL, HX OF 06/18/2008  . Other acquired absence of organ 06/18/2008  . PERCUTANEOUS TRANSLUMINAL CORONARY ANGIOPLASTY, HX OF 06/18/2008  . Other postprocedural status(V45.89) 06/18/2008  . DM 05/16/2007  . HYPERTENSION 05/16/2007  . MYOCARDIAL INFARCTION, HX OF 05/16/2007  . Coronary atherosclerosis 05/16/2007  . CAROTID ARTERY DISEASE 05/16/2007  . ARTHRITIS 05/16/2007  . OSTEOPENIA 05/16/2007  . HEMORRHOIDS 11/18/2006   Past Medical History  Diagnosis Date  . Atheroembolism   . Peptic ulcer disease   . GERD (gastroesophageal reflux  disease)   . Hyperlipidemia   . Hemorrhoids   . Osteopenia   . DM (diabetes mellitus) ([place])   . CAD (coronary artery disease)   . Hypertension   . Myocardial infarction ([place]) 03/15/95  . Heart murmur   . Leg pain   . Hiatal hernia   . Arthritis     Cervical DDD C5-6 and C6-7  . Depression   . History of shingles   . DDD (degenerative disc disease)   . Hemorrhoid   . Thyroid nodule   . Cancer ([place])     skin BCC  Forehead and  SCC Left Lower Leg  . Blue toe syndrome ([place])   . Carotid artery occlusion   . Multiple thyroid nodules     checked every year  . Anxiety   . Chronic kidney disease, stage III (moderate)   . Mild incontinence   . Gallstones       Medication List    TAKE these medications        ACCU-CHEK SMARTVIEW test strip  Generic drug:  glucose blood  1 each by Other route as needed for other (blood sugar test strips).     acetaminophen 325 MG tablet  Commonly known as:  TYLENOL  Take 325 mg by mouth every 6 (six) hours as needed for mild pain or moderate pain.     aspirin 81 MG tablet  Take 1 tablet (81 mg total) by mouth daily.     BLINK TEARS OP  Place 1 drop into both eyes daily as needed (for).     CENTRUM SILVER PO  1 tab po qd     cholecalciferol 400 units Tabs tablet  Commonly known as:  VITAMIN D  Take 400 Units by mouth daily.     clopidogrel 75 MG tablet  Commonly known as:  PLAVIX  Take 1 tablet (75 mg total) by mouth daily.     conjugated estrogens vaginal cream  Commonly known as:  PREMARIN  0.5 g once a week. .625 mg once weekly     COQ-10 PO  Take 50 mg by mouth daily. 1 tab po qd     dexlansoprazole 60 MG capsule  Commonly known as:  DEXILANT  Take 60 mg by mouth daily as needed (for acid reflux). Take 1 tablet ( Dexilant DR ) by mouth once daily     diphenhydrAMINE 25 mg capsule  Commonly known as:  BENADRYL  Take 25 mg by mouth as needed for allergies.     diphenhydramine-acetaminophen 25-500 MG Tabs tablet  Commonly  known as:  TYLENOL PM  Take 1 tablet by mouth at bedtime as needed (for sleep).     hydrochlorothiazide 12.5 MG capsule  Commonly known as:  MICROZIDE  Take 1  capsule (12.5 mg total) by mouth daily.     hydrocortisone 2.5 % cream  Apply 1 application topically daily as needed (for itching).     Melatonin 5 MG Caps  Take 1 capsule by mouth at bedtime as needed.     metoprolol succinate 25 MG 24 hr tablet  Commonly known as:  TOPROL-XL  Take 1 tablet (25 mg total) by mouth daily.     NEOMYCIN-POLYMYXIN-HYDROCORTISONE 1 % Soln otic solution  Commonly known as:  CORTISPORIN  Place 3 drops into both ears as needed (place two drops into each ear as needed for itching).     nitroGLYCERIN 0.4 MG SL tablet  Commonly known as:  NITROSTAT  Place 1 tablet (0.4 mg total) under the tongue every 5 (five) minutes as needed for chest pain.     polyethylene glycol powder powder  Commonly known as:  GLYCOLAX/MIRALAX  Take 17 g by mouth daily.     ramipril 2.5 MG capsule  Commonly known as:  ALTACE  TAKE 1 CAPSULE (2.5 MG TOTAL) BY MOUTH 2 (TWO) TIMES DAILY.     rosuvastatin 10 MG tablet  Commonly known as:  CRESTOR  Take 1 tablet (10 mg total) by mouth daily.     sertraline 25 MG tablet  Commonly known as:  ZOLOFT  Take 25 mg by mouth daily.     traMADol 50 MG tablet  Commonly known as:  ULTRAM  Take 1 tablet (50 mg total) by mouth every 8 (eight) hours as needed.        Prescriptions given: Tramadol #20 No Refill  Instructions: 1.  Shower daily with soap and water starting daily  Disposition: home  Patient's condition: is Good  Follow up: 1. Dr. Trula Slade in 2 weeks.   Leontine Locket, PA-C [facility] [phone]  --- For [facility] use --- Instructions: Press F2 to tab through selections.  Delete question if not applicable.   Modified Rankin score at D/C (0-6): 0  IV medication needed for:  1. Hypertension: No 2. Hypotension: No  Post-op  Complications: No  1. Post-op CVA or TIA: No  If yes: Event classification (right eye, left eye, right cortical, left cortical, verterobasilar, other): n/a3  If yes: Timing of event (intra-op, <6 hrs post-op, >=6 hrs post-op, unknown): n/a  2. CN injury: No  If yes: CN n/a injuried   3. Myocardial infarction: No  If yes: Dx by (EKG or clinical, Troponin): n/a  4.  CHF: No  5.  Dysrhythmia (new): No  6. Wound infection: No  7. Reperfusion symptoms: No  8. Return to OR: No  If yes: return to OR for (bleeding, neurologic, other CEA incision, other): n/a  Discharge medications: Statin use:  Yes If No: [ ]  For Medical reasons, [ ]  Non-compliant, [ ]  Not-indicated ASA use:  Yes  If No: [ ]  For Medical reasons, [ ]  Non-compliant, [ ]  Not-indicated Beta blocker use:  Yes If No: [ ]  For Medical reasons, [ ]  Non-compliant, [ ]  Not-indicated ACE-Inhibitor use:  No If No: [ ]  For Medical reasons, [ ]  Non-compliant, [ ]  Not-indicated ARB use:  No P2Y12 Antagonist use: Yes, [x ] Plavix, [ ]  Plasugrel, [ ]  Ticlopinine, [ ]  Ticagrelor, [ ]  Other, [ ]  No for medical reason, [ ]  Non-compliant, [ ]  Not-indicated Anti-coagulant use:  No, [ ]  Warfarin, [ ]  Rivaroxaban, [ ]  Dabigatran, [ ]  Other, [ ]  No for medical reason, [ ]  Non-compliant, [ ]   Not-indicated

## 2015-04-14 NOTE — Care Management Note (Signed)
Case Management Note  Patient Details  Name: Stacy [name] MRN: [id number] Date of Birth: 1926/12/16  Subjective/Objective:   Patient is from home, indep pta, no needs.                 Action/Plan:   Expected Discharge Date:                  Expected Discharge Plan:  Home/Self Care  In-House Referral:     Discharge planning Services  CM Consult  Post Acute Care Choice:    Choice offered to:     DME Arranged:    DME Agency:     HH Arranged:    [place] Agency:     Status of Service:  Completed, signed off  Medicare Important Message Given:    Date Medicare IM Given:    Medicare IM give by:    Date Additional Medicare IM Given:    Additional Medicare Important Message give by:     If discussed at [place] of Stay Meetings, dates discussed:    Additional Comments:  Zenon Mayo, RN 04/14/2015, 11:06 AM

## 2015-04-14 NOTE — Anesthesia Postprocedure Evaluation (Signed)
Anesthesia Post Note  Patient: Stacy [name]  Procedure(s) Performed: Procedure(s) (LRB): ENDARTERECTOMY CAROTID (Right) PATCH ANGIOPLASTY (Right)  Patient location during evaluation: PACU Anesthesia Type: General Level of consciousness: awake and alert and patient cooperative Pain management: pain level controlled Vital Signs Assessment: post-procedure vital signs reviewed and stable Respiratory status: spontaneous breathing and respiratory function stable Cardiovascular status: stable Anesthetic complications: no    Last Vitals:  Filed Vitals:   04/14/15 0754 04/14/15 0800  BP: 99/52   Pulse: 62 61  Temp: 36.7 C   Resp: 19 18    Last Pain:  Filed Vitals:   04/14/15 0801  PainSc: 0-No pain                 Demontae Antunes S

## 2015-04-18 ENCOUNTER — Telehealth: Payer: Self-pay | Admitting: Surgery

## 2015-04-18 NOTE — Telephone Encounter (Signed)
-----   Message from Denman George, RN sent at 04/13/2015 10:00 AM EST ----- Regarding: needs 2 wk. f/u with [facility]   ----- Message -----    From: Gabriel Earing, PA-C    Sent: 04/13/2015   9:46 AM      To: [facility] Charge Pool  S/p right CEA 04/13/15.  F/u with Dr. Trula Slade in 2 weeks.  Thanks, Aldona Bar

## 2015-04-18 NOTE — Telephone Encounter (Signed)
LM for pt with appt information, dpm °

## 2015-05-01 ENCOUNTER — Encounter: Payer: Self-pay | Admitting: Surgery

## 2015-05-05 ENCOUNTER — Encounter: Payer: Self-pay | Admitting: *Deleted

## 2015-05-05 ENCOUNTER — Ambulatory Visit (INDEPENDENT_AMBULATORY_CARE_PROVIDER_SITE_OTHER): Payer: Medicare Other | Admitting: Surgery

## 2015-05-05 ENCOUNTER — Other Ambulatory Visit: Payer: Self-pay | Admitting: *Deleted

## 2015-05-05 ENCOUNTER — Encounter: Payer: Self-pay | Admitting: Surgery

## 2015-05-05 VITALS — BP 124/75 | HR 60 | Temp 97.2°F | Resp 16 | Ht 63.5 in | Wt 144.0 lb

## 2015-05-05 DIAGNOSIS — I6523 Occlusion and stenosis of bilateral carotid arteries: Secondary | ICD-10-CM

## 2015-05-05 NOTE — Progress Notes (Signed)
Patient name: Stacy [name] MRN: [id number] DOB: January 22, 1927 Sex: female     Chief Complaint  Patient presents with  . Carotid    3 week f/u    HISTORY OF PRESENT ILLNESS: This is the patient's first postoperative follow-up.  On 04/13/2015, she underwent right carotid endarterectomy with patch angioplasty for asymptomatic right carotid stenosis.  Intraoperative findings included an 80% stenosis.  Her postoperative course was uncomplicated and she was discharged to home on postoperative day 1.  She did complain of a sore throat following her operation but that has resolved.  She has also noticed a slight drooping of the corner of her right mouth but that is improving.  She has not had any postoperative pain at the incision site.  Past Medical History  Diagnosis Date  . Atheroembolism   . Peptic ulcer disease   . GERD (gastroesophageal reflux disease)   . Hyperlipidemia   . Hemorrhoids   . Osteopenia   . DM (diabetes mellitus) ([place])   . CAD (coronary artery disease)   . Hypertension   . Myocardial infarction ([place]) 03/15/95  . Heart murmur   . Leg pain   . Hiatal hernia   . Arthritis     Cervical DDD C5-6 and C6-7  . Depression   . History of shingles   . DDD (degenerative disc disease)   . Hemorrhoid   . Thyroid nodule   . Cancer ([place])     skin BCC  Forehead and  SCC Left Lower Leg  . Blue toe syndrome ([place])   . Carotid artery occlusion   . Multiple thyroid nodules     checked every year  . Anxiety   . Chronic kidney disease, stage III (moderate)   . Mild incontinence   . Gallstones     Past Surgical History  Procedure Laterality Date  . Cataract extraction    . Tonsillectomy    . Ptca    . Appendectomy    . Hemorrhoid surgery    . Incisional hernia repair    . Basal cell cancer removal      forehead  . Dilation and curettage of uterus    . Endometrial biopsy    . Inguinal hernia repair    . Squamous cell carcinoma excision      left lower leg  . Coronary  stent placement  2000  . Eye surgery    . Coronary angioplasty  03-15-1995  . Coronary angioplasty with stent placement  05-19-1998  . Endarterectomy Right 04/13/2015    Procedure: ENDARTERECTOMY CAROTID;  Surgeon: Serafina Mitchell, MD;  Location: [facility];  Service: Vascular;  Laterality: Right;  . Patch angioplasty Right 04/13/2015    Procedure: PATCH ANGIOPLASTY;  Surgeon: Serafina Mitchell, MD;  Location: [facility] OR;  Service: Vascular;  Laterality: Right;    Social History   Social History  . Marital Status: Widowed    Spouse Name: N/A  . Number of Children: 1  . Years of Education: N/A   Occupational History  . Retired     [employer]   Social History Main Topics  . Smoking status: Former Smoker -- 1.00 packs/day for 54 years    Types: Cigarettes    Quit date: 10/05/1998  . Smokeless tobacco: Never Used  . Alcohol Use: No  . Drug Use: No  . Sexual Activity: No   Other Topics Concern  . Not on file   Social History Narrative    Family  History  Problem Relation Age of Onset  . Diabetes Father   . Prostate cancer Father     meta to colon  . Cancer Father     Prostate  . Parkinsonism Mother   . Stroke Paternal Uncle   . Heart attack Neg Hx   . Hypertension Paternal Uncle     Allergies as of 05/05/2015 - Review Complete 05/05/2015  Allergen Reaction Noted  . Iohexol Other (See Comments) 03/21/2005  . Sulfonamide derivatives Hives 06/20/2008  . Latex Other (See Comments) 10/18/2010  . Tranilast Hives 08/03/2012    Current Outpatient Prescriptions on File Prior to Visit  Medication Sig Dispense Refill  . ACCU-CHEK SMARTVIEW test strip 1 each by Other route as needed for other (blood sugar test strips).   11  . acetaminophen (TYLENOL) 325 MG tablet Take 325 mg by mouth every 6 (six) hours as needed for mild pain or moderate pain.    Marland Kitchen aspirin 81 MG tablet Take 1 tablet (81 mg total) by mouth daily. 1 tablet   . cholecalciferol (VITAMIN D) 400 UNITS TABS Take 400 Units  by mouth daily.     . clopidogrel (PLAVIX) 75 MG tablet Take 1 tablet (75 mg total) by mouth daily. 90 tablet 3  . Coenzyme Q10 (COQ-10 PO) Take 50 mg by mouth daily. 1 tab po qd    . conjugated estrogens (PREMARIN) vaginal cream 0.5 g once a week. .625 mg once weekly    . dexlansoprazole (DEXILANT) 60 MG capsule Take 60 mg by mouth daily as needed (for acid reflux). Take 1 tablet ( Dexilant DR ) by mouth once daily    . diphenhydrAMINE (BENADRYL) 25 mg capsule Take 25 mg by mouth as needed for allergies.     . diphenhydramine-acetaminophen (TYLENOL PM) 25-500 MG TABS tablet Take 1 tablet by mouth at bedtime as needed (for sleep).     . hydrochlorothiazide (MICROZIDE) 12.5 MG capsule Take 1 capsule (12.5 mg total) by mouth daily. 90 capsule 3  . hydrocortisone 2.5 % cream Apply 1 application topically daily as needed (for itching).   1  . Melatonin 5 MG CAPS Take 1 capsule by mouth at bedtime as needed.     . metoprolol succinate (TOPROL-XL) 25 MG 24 hr tablet Take 1 tablet (25 mg total) by mouth daily. 90 tablet 3  . Multiple Vitamins-Minerals (CENTRUM SILVER PO) 1 tab po qd     . NEOMYCIN-POLYMYXIN-HYDROCORTISONE (CORTISPORIN) 1 % SOLN otic solution Place 3 drops into both ears as needed (place two drops into each ear as needed for itching).   3  . nitroGLYCERIN (NITROSTAT) 0.4 MG SL tablet Place 1 tablet (0.4 mg total) under the tongue every 5 (five) minutes as needed for chest pain. 25 tablet 2  . polyethylene glycol powder (GLYCOLAX/MIRALAX) powder Take 17 g by mouth daily.    . ramipril (ALTACE) 2.5 MG capsule TAKE 1 CAPSULE (2.5 MG TOTAL) BY MOUTH 2 (TWO) TIMES DAILY. 180 capsule 3  . rosuvastatin (CRESTOR) 10 MG tablet Take 1 tablet (10 mg total) by mouth daily. 90 tablet 3  . sertraline (ZOLOFT) 25 MG tablet Take 25 mg by mouth daily.     . Polyethylene Glycol 400 (BLINK TEARS OP) Place 1 drop into both eyes daily as needed (for). Reported on 05/05/2015    . traMADol (ULTRAM) 50 MG tablet  Take 1 tablet (50 mg total) by mouth every 8 (eight) hours as needed. (Patient not taking: Reported on 05/05/2015) 20 tablet  0   No current facility-administered medications on file prior to visit.     REVIEW OF SYSTEMS: See history of present illness  PHYSICAL EXAMINATION:   Vital signs are  Filed Vitals:   05/05/15 0832 05/05/15 0836  BP: 126/73 124/75  Pulse: 55 60  Temp: 97.2 F (36.2 C)   Resp: 16   Height: 5' 3.5" (1.613 m)   Weight: 144 lb (65.318 kg)   SpO2: 97%    Body mass index is 25.11 kg/(m^2).  Neurologically intact Incision has healed nicely Slight right marginal mandibular neurapraxia   Diagnostic Studies None  Assessment: Status post right carotid endarterectomy Plan: The patient has done very well recovering from her operation.  She remains neurologically intact.  She does have a slight marginal mandibular neurapraxia which I have reassured her will improve and resolve over time.  She can resume her normal activities.  I will have her follow up in 8 months with a repeat carotid ultrasound.  Eldridge Abrahams, M.D. [facility] [place] Office: [phone] Pager:  [phone]

## 2015-05-15 DIAGNOSIS — M4316 Spondylolisthesis, lumbar region: Secondary | ICD-10-CM | POA: Diagnosis not present

## 2015-05-15 DIAGNOSIS — Z6825 Body mass index (BMI) 25.0-25.9, adult: Secondary | ICD-10-CM | POA: Diagnosis not present

## 2015-05-24 ENCOUNTER — Other Ambulatory Visit (HOSPITAL_COMMUNITY): Payer: Self-pay | Admitting: *Deleted

## 2015-05-30 DIAGNOSIS — M4307 Spondylolysis, lumbosacral region: Secondary | ICD-10-CM | POA: Diagnosis not present

## 2015-05-31 ENCOUNTER — Encounter (HOSPITAL_COMMUNITY): Payer: Self-pay | Admitting: *Deleted

## 2015-06-05 ENCOUNTER — Other Ambulatory Visit: Payer: Self-pay

## 2015-06-05 DIAGNOSIS — I2510 Atherosclerotic heart disease of native coronary artery without angina pectoris: Secondary | ICD-10-CM

## 2015-06-12 ENCOUNTER — Encounter (HOSPITAL_COMMUNITY)
Admission: RE | Admit: 2015-06-12 | Discharge: 2015-06-12 | Disposition: A | Discharge status: Home / Self Care | Payer: Self-pay | Source: Ambulatory Visit | Attending: Cardiovascular Disease | Admitting: Cardiovascular Disease

## 2015-06-12 DIAGNOSIS — I2510 Atherosclerotic heart disease of native coronary artery without angina pectoris: Secondary | ICD-10-CM | POA: Insufficient documentation

## 2015-06-12 DIAGNOSIS — Z955 Presence of coronary angioplasty implant and graft: Secondary | ICD-10-CM | POA: Insufficient documentation

## 2015-06-14 ENCOUNTER — Encounter (HOSPITAL_COMMUNITY)
Admission: RE | Admit: 2015-06-14 | Discharge: 2015-06-14 | Disposition: A | Discharge status: Home / Self Care | Payer: Self-pay | Source: Ambulatory Visit | Attending: Cardiovascular Disease | Admitting: Cardiovascular Disease

## 2015-06-16 ENCOUNTER — Encounter (HOSPITAL_COMMUNITY)
Admission: RE | Admit: 2015-06-16 | Discharge: 2015-06-16 | Disposition: A | Discharge status: Home / Self Care | Payer: Self-pay | Source: Ambulatory Visit | Attending: Cardiovascular Disease | Admitting: Cardiovascular Disease

## 2015-06-19 ENCOUNTER — Encounter (HOSPITAL_COMMUNITY)
Admission: RE | Admit: 2015-06-19 | Discharge: 2015-06-19 | Disposition: A | Discharge status: Home / Self Care | Payer: Self-pay | Source: Ambulatory Visit | Attending: Cardiovascular Disease | Admitting: Cardiovascular Disease

## 2015-06-20 DIAGNOSIS — Z6826 Body mass index (BMI) 26.0-26.9, adult: Secondary | ICD-10-CM | POA: Diagnosis not present

## 2015-06-20 DIAGNOSIS — J019 Acute sinusitis, unspecified: Secondary | ICD-10-CM | POA: Diagnosis not present

## 2015-06-20 DIAGNOSIS — J029 Acute pharyngitis, unspecified: Secondary | ICD-10-CM | POA: Diagnosis not present

## 2015-06-21 ENCOUNTER — Encounter (HOSPITAL_COMMUNITY): Payer: Self-pay

## 2015-06-23 ENCOUNTER — Encounter (HOSPITAL_COMMUNITY)
Admission: RE | Admit: 2015-06-23 | Discharge: 2015-06-23 | Disposition: A | Discharge status: Home / Self Care | Payer: Self-pay | Source: Ambulatory Visit | Attending: Cardiovascular Disease | Admitting: Cardiovascular Disease

## 2015-06-26 ENCOUNTER — Encounter (HOSPITAL_COMMUNITY): Payer: Self-pay

## 2015-06-28 ENCOUNTER — Encounter (HOSPITAL_COMMUNITY): Payer: Self-pay

## 2015-06-30 ENCOUNTER — Encounter (HOSPITAL_COMMUNITY): Payer: Self-pay

## 2015-07-03 ENCOUNTER — Encounter (HOSPITAL_COMMUNITY)
Admission: RE | Admit: 2015-07-03 | Discharge: 2015-07-03 | Disposition: A | Discharge status: Home / Self Care | Payer: Self-pay | Source: Ambulatory Visit | Attending: Cardiovascular Disease | Admitting: Cardiovascular Disease

## 2015-07-05 ENCOUNTER — Encounter (HOSPITAL_COMMUNITY)
Admission: RE | Admit: 2015-07-05 | Discharge: 2015-07-05 | Disposition: A | Discharge status: Home / Self Care | Payer: Self-pay | Source: Ambulatory Visit | Attending: Cardiovascular Disease | Admitting: Cardiovascular Disease

## 2015-07-07 ENCOUNTER — Encounter (HOSPITAL_COMMUNITY): Payer: Self-pay

## 2015-07-12 ENCOUNTER — Encounter (HOSPITAL_COMMUNITY): Payer: Self-pay

## 2015-07-14 ENCOUNTER — Encounter (HOSPITAL_COMMUNITY)
Admission: RE | Admit: 2015-07-14 | Discharge: 2015-07-14 | Disposition: A | Discharge status: Home / Self Care | Payer: Self-pay | Source: Ambulatory Visit | Attending: Cardiovascular Disease | Admitting: Cardiovascular Disease

## 2015-07-14 DIAGNOSIS — Z955 Presence of coronary angioplasty implant and graft: Secondary | ICD-10-CM | POA: Insufficient documentation

## 2015-07-14 DIAGNOSIS — I2510 Atherosclerotic heart disease of native coronary artery without angina pectoris: Secondary | ICD-10-CM | POA: Insufficient documentation

## 2015-07-17 ENCOUNTER — Encounter (HOSPITAL_COMMUNITY)
Admission: RE | Admit: 2015-07-17 | Discharge: 2015-07-17 | Disposition: A | Discharge status: Home / Self Care | Payer: Self-pay | Source: Ambulatory Visit | Attending: Cardiovascular Disease | Admitting: Cardiovascular Disease

## 2015-07-18 DIAGNOSIS — E785 Hyperlipidemia, unspecified: Secondary | ICD-10-CM | POA: Diagnosis not present

## 2015-07-18 DIAGNOSIS — I10 Essential (primary) hypertension: Secondary | ICD-10-CM | POA: Diagnosis not present

## 2015-07-18 DIAGNOSIS — E782 Mixed hyperlipidemia: Secondary | ICD-10-CM | POA: Diagnosis not present

## 2015-07-18 DIAGNOSIS — E1129 Type 2 diabetes mellitus with other diabetic kidney complication: Secondary | ICD-10-CM | POA: Diagnosis not present

## 2015-07-18 DIAGNOSIS — N183 Chronic kidney disease, stage 3 (moderate): Secondary | ICD-10-CM | POA: Diagnosis not present

## 2015-07-18 DIAGNOSIS — E119 Type 2 diabetes mellitus without complications: Secondary | ICD-10-CM | POA: Diagnosis not present

## 2015-07-19 ENCOUNTER — Encounter (HOSPITAL_COMMUNITY)
Admission: RE | Admit: 2015-07-19 | Discharge: 2015-07-19 | Disposition: A | Discharge status: Home / Self Care | Payer: Self-pay | Source: Ambulatory Visit | Attending: Cardiovascular Disease | Admitting: Cardiovascular Disease

## 2015-07-20 DIAGNOSIS — Z6825 Body mass index (BMI) 25.0-25.9, adult: Secondary | ICD-10-CM | POA: Diagnosis not present

## 2015-07-20 DIAGNOSIS — E1129 Type 2 diabetes mellitus with other diabetic kidney complication: Secondary | ICD-10-CM | POA: Diagnosis not present

## 2015-07-20 DIAGNOSIS — I2510 Atherosclerotic heart disease of native coronary artery without angina pectoris: Secondary | ICD-10-CM | POA: Diagnosis not present

## 2015-07-20 DIAGNOSIS — N183 Chronic kidney disease, stage 3 (moderate): Secondary | ICD-10-CM | POA: Diagnosis not present

## 2015-07-20 DIAGNOSIS — I10 Essential (primary) hypertension: Secondary | ICD-10-CM | POA: Diagnosis not present

## 2015-07-20 DIAGNOSIS — E785 Hyperlipidemia, unspecified: Secondary | ICD-10-CM | POA: Diagnosis not present

## 2015-07-21 ENCOUNTER — Encounter (HOSPITAL_COMMUNITY): Payer: Self-pay

## 2015-07-24 ENCOUNTER — Encounter (HOSPITAL_COMMUNITY)
Admission: RE | Admit: 2015-07-24 | Discharge: 2015-07-24 | Disposition: A | Discharge status: Home / Self Care | Payer: Self-pay | Source: Ambulatory Visit | Attending: Cardiovascular Disease | Admitting: Cardiovascular Disease

## 2015-07-26 ENCOUNTER — Ambulatory Visit: Payer: Self-pay | Admitting: Cardiovascular Disease

## 2015-07-26 ENCOUNTER — Encounter (HOSPITAL_COMMUNITY): Payer: Self-pay

## 2015-07-28 ENCOUNTER — Encounter (HOSPITAL_COMMUNITY): Payer: Self-pay

## 2015-07-31 ENCOUNTER — Encounter (HOSPITAL_COMMUNITY)
Admission: RE | Admit: 2015-07-31 | Discharge: 2015-07-31 | Disposition: A | Discharge status: Home / Self Care | Payer: Medicare Other | Source: Ambulatory Visit | Attending: Cardiovascular Disease | Admitting: Cardiovascular Disease

## 2015-08-02 ENCOUNTER — Encounter (HOSPITAL_COMMUNITY)
Admission: RE | Admit: 2015-08-02 | Discharge: 2015-08-02 | Disposition: A | Discharge status: Home / Self Care | Payer: Self-pay | Source: Ambulatory Visit | Attending: Cardiovascular Disease | Admitting: Cardiovascular Disease

## 2015-08-04 ENCOUNTER — Encounter (HOSPITAL_COMMUNITY)
Admission: RE | Admit: 2015-08-04 | Discharge: 2015-08-04 | Disposition: A | Discharge status: Home / Self Care | Payer: Self-pay | Source: Ambulatory Visit | Attending: Cardiovascular Disease | Admitting: Cardiovascular Disease

## 2015-08-07 ENCOUNTER — Encounter (HOSPITAL_COMMUNITY)
Admission: RE | Admit: 2015-08-07 | Discharge: 2015-08-07 | Disposition: A | Discharge status: Home / Self Care | Payer: Medicare Other | Source: Ambulatory Visit | Attending: Cardiovascular Disease | Admitting: Cardiovascular Disease

## 2015-08-09 ENCOUNTER — Encounter (HOSPITAL_COMMUNITY)
Admission: RE | Admit: 2015-08-09 | Discharge: 2015-08-09 | Disposition: A | Discharge status: Home / Self Care | Payer: Self-pay | Source: Ambulatory Visit | Attending: Cardiovascular Disease | Admitting: Cardiovascular Disease

## 2015-08-11 ENCOUNTER — Encounter (HOSPITAL_COMMUNITY)
Admission: RE | Admit: 2015-08-11 | Discharge: 2015-08-11 | Disposition: A | Discharge status: Home / Self Care | Payer: Medicare Other | Source: Ambulatory Visit | Attending: Cardiovascular Disease | Admitting: Cardiovascular Disease

## 2015-08-14 ENCOUNTER — Encounter (HOSPITAL_COMMUNITY)
Admission: RE | Admit: 2015-08-14 | Discharge: 2015-08-14 | Disposition: A | Discharge status: Home / Self Care | Payer: Self-pay | Source: Ambulatory Visit | Attending: Cardiovascular Disease | Admitting: Cardiovascular Disease

## 2015-08-14 DIAGNOSIS — Z955 Presence of coronary angioplasty implant and graft: Secondary | ICD-10-CM | POA: Insufficient documentation

## 2015-08-14 DIAGNOSIS — I2510 Atherosclerotic heart disease of native coronary artery without angina pectoris: Secondary | ICD-10-CM | POA: Insufficient documentation

## 2015-08-16 ENCOUNTER — Encounter (HOSPITAL_COMMUNITY)
Admission: RE | Admit: 2015-08-16 | Discharge: 2015-08-16 | Disposition: A | Discharge status: Home / Self Care | Payer: Self-pay | Source: Ambulatory Visit | Attending: Cardiovascular Disease | Admitting: Cardiovascular Disease

## 2015-08-18 ENCOUNTER — Encounter (HOSPITAL_COMMUNITY)
Admission: RE | Admit: 2015-08-18 | Discharge: 2015-08-18 | Disposition: A | Discharge status: Home / Self Care | Payer: Self-pay | Source: Ambulatory Visit | Attending: Cardiovascular Disease | Admitting: Cardiovascular Disease

## 2015-08-21 ENCOUNTER — Encounter (HOSPITAL_COMMUNITY)
Admission: RE | Admit: 2015-08-21 | Discharge: 2015-08-21 | Disposition: A | Discharge status: Home / Self Care | Payer: Self-pay | Source: Ambulatory Visit | Attending: Cardiovascular Disease | Admitting: Cardiovascular Disease

## 2015-08-23 ENCOUNTER — Encounter (HOSPITAL_COMMUNITY)
Admission: RE | Admit: 2015-08-23 | Discharge: 2015-08-23 | Disposition: A | Discharge status: Home / Self Care | Payer: Medicare Other | Source: Ambulatory Visit | Attending: Cardiovascular Disease | Admitting: Cardiovascular Disease

## 2015-08-25 ENCOUNTER — Encounter (HOSPITAL_COMMUNITY): Payer: Self-pay

## 2015-08-28 ENCOUNTER — Encounter (HOSPITAL_COMMUNITY)
Admission: RE | Admit: 2015-08-28 | Discharge: 2015-08-28 | Disposition: A | Discharge status: Home / Self Care | Payer: Self-pay | Source: Ambulatory Visit | Attending: Cardiovascular Disease | Admitting: Cardiovascular Disease

## 2015-08-30 ENCOUNTER — Encounter (HOSPITAL_COMMUNITY)
Admission: RE | Admit: 2015-08-30 | Discharge: 2015-08-30 | Disposition: A | Discharge status: Home / Self Care | Payer: Self-pay | Source: Ambulatory Visit | Attending: Cardiovascular Disease | Admitting: Cardiovascular Disease

## 2015-08-31 ENCOUNTER — Encounter: Payer: Self-pay | Admitting: Family

## 2015-09-01 ENCOUNTER — Encounter (HOSPITAL_COMMUNITY)
Admission: RE | Admit: 2015-09-01 | Discharge: 2015-09-01 | Disposition: A | Discharge status: Home / Self Care | Payer: Self-pay | Source: Ambulatory Visit | Attending: Cardiovascular Disease | Admitting: Cardiovascular Disease

## 2015-09-04 ENCOUNTER — Ambulatory Visit (HOSPITAL_COMMUNITY)
Admission: RE | Admit: 2015-09-04 | Discharge: 2015-09-04 | Disposition: A | Discharge status: Home / Self Care | Payer: Medicare Other | Source: Ambulatory Visit | Attending: Family | Admitting: Family

## 2015-09-04 ENCOUNTER — Encounter: Payer: Self-pay | Admitting: Family

## 2015-09-04 ENCOUNTER — Encounter (HOSPITAL_COMMUNITY): Payer: Self-pay

## 2015-09-04 ENCOUNTER — Ambulatory Visit (INDEPENDENT_AMBULATORY_CARE_PROVIDER_SITE_OTHER): Payer: Medicare Other | Admitting: Family

## 2015-09-04 ENCOUNTER — Other Ambulatory Visit: Payer: Self-pay | Admitting: Surgery

## 2015-09-04 VITALS — BP 127/67 | HR 55 | Temp 96.4°F | Resp 18 | Ht 62.75 in | Wt 143.8 lb

## 2015-09-04 DIAGNOSIS — I714 Abdominal aortic aneurysm, without rupture, unspecified: Secondary | ICD-10-CM

## 2015-09-04 DIAGNOSIS — I6523 Occlusion and stenosis of bilateral carotid arteries: Secondary | ICD-10-CM | POA: Diagnosis not present

## 2015-09-04 DIAGNOSIS — Z9889 Other specified postprocedural states: Secondary | ICD-10-CM | POA: Diagnosis not present

## 2015-09-04 DIAGNOSIS — N183 Chronic kidney disease, stage 3 (moderate): Secondary | ICD-10-CM | POA: Insufficient documentation

## 2015-09-04 DIAGNOSIS — I129 Hypertensive chronic kidney disease with stage 1 through stage 4 chronic kidney disease, or unspecified chronic kidney disease: Secondary | ICD-10-CM | POA: Insufficient documentation

## 2015-09-04 DIAGNOSIS — R0989 Other specified symptoms and signs involving the circulatory and respiratory systems: Secondary | ICD-10-CM

## 2015-09-04 DIAGNOSIS — E785 Hyperlipidemia, unspecified: Secondary | ICD-10-CM | POA: Diagnosis not present

## 2015-09-04 DIAGNOSIS — K219 Gastro-esophageal reflux disease without esophagitis: Secondary | ICD-10-CM | POA: Diagnosis not present

## 2015-09-04 DIAGNOSIS — I6529 Occlusion and stenosis of unspecified carotid artery: Secondary | ICD-10-CM | POA: Insufficient documentation

## 2015-09-04 DIAGNOSIS — F419 Anxiety disorder, unspecified: Secondary | ICD-10-CM | POA: Diagnosis not present

## 2015-09-04 DIAGNOSIS — E1122 Type 2 diabetes mellitus with diabetic chronic kidney disease: Secondary | ICD-10-CM | POA: Insufficient documentation

## 2015-09-04 DIAGNOSIS — I6521 Occlusion and stenosis of right carotid artery: Secondary | ICD-10-CM | POA: Diagnosis not present

## 2015-09-04 DIAGNOSIS — I2510 Atherosclerotic heart disease of native coronary artery without angina pectoris: Secondary | ICD-10-CM | POA: Insufficient documentation

## 2015-09-04 DIAGNOSIS — F329 Major depressive disorder, single episode, unspecified: Secondary | ICD-10-CM | POA: Diagnosis not present

## 2015-09-04 NOTE — Progress Notes (Signed)
Chief Complaint: Follow up Extracranial Carotid Artery Stenosis   History of Present Illness  Kaysea Raya Feltner is a 82 y.o. female patient of Dr. Trula Slade who is s/p right carotid endarterectomy with patch angioplasty for asymptomatic right carotid stenosis on 04/13/2015.  Intraoperative findings included an 80% stenosis.  Her postoperative course was uncomplicated and she was discharged to home on postoperative day 1.  She did complain of a sore throat following her operation but that has resolved.  She has also noticed a slight drooping of the corner of her right mouth but that is improving.  She has not had any postoperative pain at the incision site.who has known carotid stenosis.  The patient comes back today for followup of her carotid artery occlusive disease, history of small aortic aneurysm, and decreased pedal pulses. Her pedal pulses were not palpable at a previous visit, she has no claudication symptoms, no non healing wounds.  Her ABI's in January 2016 were normal with all triphasic waveforms, TBI's were below normal.  She has right sciatic pain, was taking acupuncture for this, states this has not helped.  She denies any history of stroke or TIA. She denies any new back or abdominal pain.  She had an MI in 1997, angioplasty, had a cardiac stent placed in 2000. Dr. Sherren Mocha is her cardiologist.  Her walking is limited by her right sciatic pain.   Pt Diabetic: Yes, A1C in January 2017 was 7.1, does have DM Pt smoker: former smoker, quit in 2000, denies exposure to second hand smoke  Pt meds include: Statin :Yes Betablocker: Yes ASA: Yes Other anticoagulants/antiplatelets: Plavix  Past Medical History:  Diagnosis Date  . Anxiety   . Arthritis    Cervical DDD C5-6 and C6-7  . Atheroembolism   . Blue toe syndrome ([place])   . CAD (coronary artery disease)   . Cancer ([place])    skin BCC  Forehead and  SCC Left Lower Leg  . Carotid artery occlusion   . Chronic  kidney disease, stage III (moderate)   . DDD (degenerative disc disease)   . Depression   . DM (diabetes mellitus) ([place])   . Gallstones   . GERD (gastroesophageal reflux disease)   . Heart murmur   . Hemorrhoid   . Hemorrhoids   . Hiatal hernia   . History of shingles   . Hyperlipidemia   . Hypertension   . Leg pain   . Mild incontinence   . Multiple thyroid nodules    checked every year  . Myocardial infarction ([place]) 03/15/95  . Osteopenia   . Peptic ulcer disease   . Thyroid nodule     Social History Social History  Substance Use Topics  . Smoking status: Former Smoker    Packs/day: 1.00    Years: 54.00    Types: Cigarettes    Quit date: 10/05/1998  . Smokeless tobacco: Never Used  . Alcohol use No    Family History Family History  Problem Relation Age of Onset  . Diabetes Father   . Prostate cancer Father     meta to colon  . Cancer Father     Prostate  . Parkinsonism Mother   . Stroke Paternal Uncle   . Heart attack Neg Hx   . Hypertension Paternal Uncle     Surgical History Past Surgical History:  Procedure Laterality Date  . APPENDECTOMY    . Basal Cell Cancer Removal     forehead  . CATARACT EXTRACTION    .  CORONARY ANGIOPLASTY  03-15-1995  . CORONARY ANGIOPLASTY WITH STENT PLACEMENT  05-19-1998  . CORONARY STENT PLACEMENT  2000  . DILATION AND CURETTAGE OF UTERUS    . ENDARTERECTOMY Right 04/13/2015   Procedure: ENDARTERECTOMY CAROTID;  Surgeon: Serafina Mitchell, MD;  Location: [facility] OR;  Service: Vascular;  Laterality: Right;  . ENDOMETRIAL BIOPSY    . EYE SURGERY    . HEMORRHOID SURGERY    . INCISIONAL HERNIA REPAIR    . INGUINAL HERNIA REPAIR    . PATCH ANGIOPLASTY Right 04/13/2015   Procedure: PATCH ANGIOPLASTY;  Surgeon: Serafina Mitchell, MD;  Location: [facility];  Service: Vascular;  Laterality: Right;  . PTCA    . SQUAMOUS CELL CARCINOMA EXCISION     left lower leg  . TONSILLECTOMY      Allergies  Allergen Reactions  . Iohexol Other (See  Comments)     Desc: HIVES- 13 HR PRE-MEDS ARE REQUIRED-ASM- 03/21/05,SULFA,ADHESIVE TAPE   . Sulfonamide Derivatives Hives    REACTION: hives  . Latex Other (See Comments)    Adhesive tape and EKG adhesive leads to rash  . Tranilast Hives    Investigational medication    Current Outpatient Prescriptions  Medication Sig Dispense Refill  . ACCU-CHEK SMARTVIEW test strip 1 each by Other route as needed for other (blood sugar test strips).   11  . acetaminophen (TYLENOL) 325 MG tablet Take 325 mg by mouth every 6 (six) hours as needed for mild pain or moderate pain.    Marland Kitchen aspirin 81 MG tablet Take 1 tablet (81 mg total) by mouth daily. 1 tablet   . cholecalciferol (VITAMIN D) 400 UNITS TABS Take 400 Units by mouth daily.     . clopidogrel (PLAVIX) 75 MG tablet Take 1 tablet (75 mg total) by mouth daily. 90 tablet 3  . Coenzyme Q10 (COQ-10 PO) Take 50 mg by mouth daily. 1 tab po qd    . conjugated estrogens (PREMARIN) vaginal cream 0.5 g once a week. .625 mg once weekly    . dexlansoprazole (DEXILANT) 60 MG capsule Take 60 mg by mouth daily as needed (for acid reflux). Take 1 tablet ( Dexilant DR ) by mouth once daily    . diphenhydrAMINE (BENADRYL) 25 mg capsule Take 25 mg by mouth as needed for allergies.     . diphenhydramine-acetaminophen (TYLENOL PM) 25-500 MG TABS tablet Take 1 tablet by mouth at bedtime as needed (for sleep).     . hydrochlorothiazide (MICROZIDE) 12.5 MG capsule Take 1 capsule (12.5 mg total) by mouth daily. 90 capsule 3  . hydrocortisone 2.5 % cream Apply 1 application topically daily as needed (for itching).   1  . Melatonin 5 MG CAPS Take 1 capsule by mouth at bedtime as needed.     . metoprolol succinate (TOPROL-XL) 25 MG 24 hr tablet Take 1 tablet (25 mg total) by mouth daily. 90 tablet 3  . Multiple Vitamins-Minerals (CENTRUM SILVER PO) 1 tab po qd     . NEOMYCIN-POLYMYXIN-HYDROCORTISONE (CORTISPORIN) 1 % SOLN otic solution Place 3 drops into both ears as needed  (place two drops into each ear as needed for itching).   3  . nitroGLYCERIN (NITROSTAT) 0.4 MG SL tablet Place 1 tablet (0.4 mg total) under the tongue every 5 (five) minutes as needed for chest pain. 25 tablet 2  . Polyethylene Glycol 400 (BLINK TEARS OP) Place 1 drop into both eyes daily as needed (for). Reported on 05/05/2015    . polyethylene  glycol powder (GLYCOLAX/MIRALAX) powder Take 17 g by mouth daily. Reported on 06/12/2015    . ramipril (ALTACE) 2.5 MG capsule TAKE 1 CAPSULE (2.5 MG TOTAL) BY MOUTH 2 (TWO) TIMES DAILY. 180 capsule 3  . rosuvastatin (CRESTOR) 10 MG tablet Take 1 tablet (10 mg total) by mouth daily. 90 tablet 3  . sertraline (ZOLOFT) 25 MG tablet Take 25 mg by mouth daily.     Marland Kitchen trolamine salicylate (ASPERCREME) 10 % cream Apply 1 application topically as needed for muscle pain.    . traMADol (ULTRAM) 50 MG tablet Take 1 tablet (50 mg total) by mouth every 8 (eight) hours as needed. (Patient not taking: Reported on 05/05/2015) 20 tablet 0   No current facility-administered medications for this visit.     Review of Systems : See HPI for pertinent positives and negatives.  Physical Examination  Vitals:   09/04/15 0934  BP: 127/67  Pulse: (!) 55  Resp: 18  Temp: (!) 96.4 F (35.8 C)  TempSrc: Oral  SpO2: 95%  Weight: 143 lb 12.8 oz (65.2 kg)  Height: 5' 2.75" (1.594 m)   Body mass index is 25.68 kg/m.  General: WDWN in NAD Gait: Normal HENT: WNL Eyes: Pupils equal Pulmonary: normal non-labored breathing , CTAB Cardiac: regular rate and rhythm, no murmur detected  Abdomen: soft, NT, no palpable masses Skin: no rashes, no ulcers, no cellulitis.  VASCULAR EXAM  Carotid Bruits Left Right   Negative Negative  Radial pulses: 2+ palpable and = Aorta is not palpable   VASCULAR EXAM: Extremities without ischemic changes  without Gangrene; without open wounds.      LE Pulses LEFT RIGHT   FEMORAL 2+ palpable not palpable    POPLITEAL not palpable  not palpable   POSTERIOR TIBIAL not palpable  not palpable    DORSALIS PEDIS  ANTERIOR TIBIAL  palpable  palpable     Musculoskeletal: no muscle wasting or atrophy; no peripheral edema Neurologic: A&O X 3; Appropriate Affect ;  SENSATION: normal; MOTOR FUNCTION: 5/5 Symmetric, CN 2-12 intact Speech is fluent/normal    Assessment: ELLINORE MERCED is a 82 y.o. female who is s/p right carotid endarterectomy with patch angioplasty for asymptomatic right carotid stenosis on 04/13/2015.  Today's carotid duplex suggests right ICA, CEA site, with no restenosis. No left ICA stenosis. Significant improvement of the right ICA, stable left ICA, compared to exam of 03/24/15.  Pt had some imaging in the past which indicated a small AAA and was scheduled for recheck of this today; but 4 serial abdominal duplex have indicated no evidence of AAA. Will no longer need to monitor for AAA. Carotid duplex was performed instead.   Plan: Follow-up in November 2017 with Carotid Duplex scan,see Dr. Trula Slade afterward, as already scheduled.   I discussed in depth with the patient the nature of atherosclerosis, and emphasized the importance of maximal medical management including strict control of blood pressure, blood glucose, and lipid levels, obtaining regular exercise, and continued cessation of smoking.  The patient is aware that without maximal medical management the underlying atherosclerotic disease process will progress, limiting the benefit of any interventions. The patient was given information about stroke prevention and what symptoms should prompt the patient to seek immediate medical care. Thank you for allowing [place] to participate in this  patient's care.  Clemon Chambers, RN, MSN, FNP-C [facility] Office: [facility] Physician: Trula Slade  09/04/15 9:43 AM

## 2015-09-04 NOTE — Patient Instructions (Signed)
Stroke Prevention Some medical conditions and behaviors are associated with an increased chance of having a stroke. You may prevent a stroke by making healthy choices and managing medical conditions. HOW CAN I REDUCE MY RISK OF HAVING A STROKE?   Stay physically active. Get at least 30 minutes of activity on most or all days.  Do not smoke. It may also be helpful to avoid exposure to secondhand smoke.  Limit alcohol use. Moderate alcohol use is considered to be:  No more than 2 drinks per day for men.  No more than 1 drink per day for nonpregnant women.  Eat healthy foods. This involves:  Eating 5 or more servings of fruits and vegetables a day.  Making dietary changes that address high blood pressure (hypertension), high cholesterol, diabetes, or obesity.  Manage your cholesterol levels.  Making food choices that are high in fiber and low in saturated fat, trans fat, and cholesterol may control cholesterol levels.  Take any prescribed medicines to control cholesterol as directed by your health care provider.  Manage your diabetes.  Controlling your carbohydrate and sugar intake is recommended to manage diabetes.  Take any prescribed medicines to control diabetes as directed by your health care provider.  Control your hypertension.  Making food choices that are low in salt (sodium), saturated fat, trans fat, and cholesterol is recommended to manage hypertension.  Ask your health care provider if you need treatment to lower your blood pressure. Take any prescribed medicines to control hypertension as directed by your health care provider.  If you are 18-39 years of age, have your blood pressure checked every 3-5 years. If you are 40 years of age or older, have your blood pressure checked every year.  Maintain a healthy weight.  Reducing calorie intake and making food choices that are low in sodium, saturated fat, trans fat, and cholesterol are recommended to manage  weight.  Stop drug abuse.  Avoid taking birth control pills.  Talk to your health care provider about the risks of taking birth control pills if you are over 35 years old, smoke, get migraines, or have ever had a blood clot.  Get evaluated for sleep disorders (sleep apnea).  Talk to your health care provider about getting a sleep evaluation if you snore a lot or have excessive sleepiness.  Take medicines only as directed by your health care provider.  For some people, aspirin or blood thinners (anticoagulants) are helpful in reducing the risk of forming abnormal blood clots that can lead to stroke. If you have the irregular heart rhythm of atrial fibrillation, you should be on a blood thinner unless there is a good reason you cannot take them.  Understand all your medicine instructions.  Make sure that other conditions (such as anemia or atherosclerosis) are addressed. SEEK IMMEDIATE MEDICAL CARE IF:   You have sudden weakness or numbness of the face, arm, or leg, especially on one side of the body.  Your face or eyelid droops to one side.  You have sudden confusion.  You have trouble speaking (aphasia) or understanding.  You have sudden trouble seeing in one or both eyes.  You have sudden trouble walking.  You have dizziness.  You have a loss of balance or coordination.  You have a sudden, severe headache with no known cause.  You have new chest pain or an irregular heartbeat. Any of these symptoms may represent a serious problem that is an emergency. Do not wait to see if the symptoms will   go away. Get medical help at once. Call your local emergency services (911 in [place] Do not drive yourself to the hospital.   This information is not intended to replace advice given to you by your health care provider. Make sure you discuss any questions you have with your health care provider.   Document Released: 03/07/2004 Document Revised: 02/18/2014 Document Reviewed:  07/31/2012 Elsevier Interactive Patient Education 2016 Elsevier Inc.  

## 2015-09-06 ENCOUNTER — Encounter (HOSPITAL_COMMUNITY)
Admission: RE | Admit: 2015-09-06 | Discharge: 2015-09-06 | Disposition: A | Discharge status: Home / Self Care | Payer: Self-pay | Source: Ambulatory Visit | Attending: Cardiovascular Disease | Admitting: Cardiovascular Disease

## 2015-09-08 ENCOUNTER — Encounter (HOSPITAL_COMMUNITY)
Admission: RE | Admit: 2015-09-08 | Discharge: 2015-09-08 | Disposition: A | Discharge status: Home / Self Care | Payer: Self-pay | Source: Ambulatory Visit | Attending: Cardiovascular Disease | Admitting: Cardiovascular Disease

## 2015-09-08 ENCOUNTER — Telehealth: Payer: Self-pay | Admitting: Surgery

## 2015-09-08 NOTE — Telephone Encounter (Addendum)
-----   Message from Viann Fish, NP sent at 09/01/2015  1:44 PM EDT ----- Regarding: no need for AAA follow up? Dr. Trula Slade, Pt is scheduled to return this Monday, 7/24, with AAA duplex. She recently had a CEA performed by you, so we are continuing to monitor her carotid artery stenosis. Pt is scheduled to return in November 2017 for 8 month carotid duplex and see you.  Juliann Pulse Comfort said pt had no evidence of AAA on the last 4 duplex.  My last note re this:  08/29/14 AAA Duplex suggested no aneurysm or dilatation in the abdominal aorta or proximal common iliac arteries which was no change from prior Duplex.  May we cancel the AAA duplex and visit for this Monday?  Thank you, Vinnie Level   Obviously this patient kept her appt for 08/29/15, Not sure what happened with the staff message. annette

## 2015-09-11 ENCOUNTER — Encounter (HOSPITAL_COMMUNITY)
Admission: RE | Admit: 2015-09-11 | Discharge: 2015-09-11 | Disposition: A | Discharge status: Home / Self Care | Payer: Medicare Other | Source: Ambulatory Visit | Attending: Cardiovascular Disease | Admitting: Cardiovascular Disease

## 2015-09-13 ENCOUNTER — Encounter (HOSPITAL_COMMUNITY)
Admission: RE | Admit: 2015-09-13 | Discharge: 2015-09-13 | Disposition: A | Discharge status: Home / Self Care | Payer: Self-pay | Source: Ambulatory Visit | Attending: Cardiovascular Disease | Admitting: Cardiovascular Disease

## 2015-09-13 DIAGNOSIS — Z955 Presence of coronary angioplasty implant and graft: Secondary | ICD-10-CM | POA: Insufficient documentation

## 2015-09-13 DIAGNOSIS — I2510 Atherosclerotic heart disease of native coronary artery without angina pectoris: Secondary | ICD-10-CM | POA: Insufficient documentation

## 2015-09-15 ENCOUNTER — Encounter (HOSPITAL_COMMUNITY)
Admission: RE | Admit: 2015-09-15 | Discharge: 2015-09-15 | Disposition: A | Discharge status: Home / Self Care | Payer: Self-pay | Source: Ambulatory Visit | Attending: Cardiovascular Disease | Admitting: Cardiovascular Disease

## 2015-09-18 ENCOUNTER — Encounter (HOSPITAL_COMMUNITY)
Admission: RE | Admit: 2015-09-18 | Discharge: 2015-09-18 | Disposition: A | Discharge status: Home / Self Care | Payer: Medicare Other | Source: Ambulatory Visit | Attending: Cardiovascular Disease | Admitting: Cardiovascular Disease

## 2015-09-20 ENCOUNTER — Encounter (HOSPITAL_COMMUNITY): Payer: Self-pay

## 2015-09-20 DIAGNOSIS — D1801 Hemangioma of skin and subcutaneous tissue: Secondary | ICD-10-CM | POA: Diagnosis not present

## 2015-09-20 DIAGNOSIS — L218 Other seborrheic dermatitis: Secondary | ICD-10-CM | POA: Diagnosis not present

## 2015-09-20 DIAGNOSIS — D2239 Melanocytic nevi of other parts of face: Secondary | ICD-10-CM | POA: Diagnosis not present

## 2015-09-20 DIAGNOSIS — Z85828 Personal history of other malignant neoplasm of skin: Secondary | ICD-10-CM | POA: Diagnosis not present

## 2015-09-20 DIAGNOSIS — L821 Other seborrheic keratosis: Secondary | ICD-10-CM | POA: Diagnosis not present

## 2015-09-22 ENCOUNTER — Encounter (HOSPITAL_COMMUNITY)
Admission: RE | Admit: 2015-09-22 | Discharge: 2015-09-22 | Disposition: A | Discharge status: Home / Self Care | Payer: Self-pay | Source: Ambulatory Visit | Attending: Cardiovascular Disease | Admitting: Cardiovascular Disease

## 2015-09-25 ENCOUNTER — Encounter (HOSPITAL_COMMUNITY)
Admission: RE | Admit: 2015-09-25 | Discharge: 2015-09-25 | Disposition: A | Discharge status: Home / Self Care | Payer: Self-pay | Source: Ambulatory Visit | Attending: Cardiovascular Disease | Admitting: Cardiovascular Disease

## 2015-09-27 ENCOUNTER — Encounter (HOSPITAL_COMMUNITY)
Admission: RE | Admit: 2015-09-27 | Discharge: 2015-09-27 | Disposition: A | Discharge status: Home / Self Care | Payer: Self-pay | Source: Ambulatory Visit | Attending: Cardiovascular Disease | Admitting: Cardiovascular Disease

## 2015-09-29 ENCOUNTER — Encounter (HOSPITAL_COMMUNITY)
Admission: RE | Admit: 2015-09-29 | Discharge: 2015-09-29 | Disposition: A | Discharge status: Home / Self Care | Payer: Self-pay | Source: Ambulatory Visit | Attending: Cardiovascular Disease | Admitting: Cardiovascular Disease

## 2015-10-02 ENCOUNTER — Encounter (HOSPITAL_COMMUNITY)
Admission: RE | Admit: 2015-10-02 | Discharge: 2015-10-02 | Disposition: A | Discharge status: Home / Self Care | Payer: Self-pay | Source: Ambulatory Visit | Attending: Cardiovascular Disease | Admitting: Cardiovascular Disease

## 2015-10-04 ENCOUNTER — Encounter (HOSPITAL_COMMUNITY)
Admission: RE | Admit: 2015-10-04 | Discharge: 2015-10-04 | Disposition: A | Discharge status: Home / Self Care | Payer: Self-pay | Source: Ambulatory Visit | Attending: Cardiovascular Disease | Admitting: Cardiovascular Disease

## 2015-10-06 ENCOUNTER — Encounter (HOSPITAL_COMMUNITY)
Admission: RE | Admit: 2015-10-06 | Discharge: 2015-10-06 | Disposition: A | Discharge status: Home / Self Care | Payer: Self-pay | Source: Ambulatory Visit | Attending: Cardiovascular Disease | Admitting: Cardiovascular Disease

## 2015-10-09 ENCOUNTER — Encounter (HOSPITAL_COMMUNITY)
Admission: RE | Admit: 2015-10-09 | Discharge: 2015-10-09 | Disposition: A | Discharge status: Home / Self Care | Payer: Self-pay | Source: Ambulatory Visit | Attending: Cardiovascular Disease | Admitting: Cardiovascular Disease

## 2015-10-11 ENCOUNTER — Encounter (HOSPITAL_COMMUNITY): Payer: Self-pay

## 2015-10-11 ENCOUNTER — Other Ambulatory Visit: Payer: Self-pay

## 2015-10-13 ENCOUNTER — Encounter: Payer: Self-pay | Admitting: Cardiovascular Disease

## 2015-10-13 ENCOUNTER — Encounter (HOSPITAL_COMMUNITY): Payer: Self-pay

## 2015-10-13 DIAGNOSIS — Z955 Presence of coronary angioplasty implant and graft: Secondary | ICD-10-CM | POA: Insufficient documentation

## 2015-10-13 DIAGNOSIS — I2510 Atherosclerotic heart disease of native coronary artery without angina pectoris: Secondary | ICD-10-CM | POA: Insufficient documentation

## 2015-10-18 ENCOUNTER — Encounter (HOSPITAL_COMMUNITY)
Admission: RE | Admit: 2015-10-18 | Discharge: 2015-10-18 | Disposition: A | Discharge status: Home / Self Care | Payer: Self-pay | Source: Ambulatory Visit | Attending: Cardiovascular Disease | Admitting: Cardiovascular Disease

## 2015-10-20 ENCOUNTER — Encounter (HOSPITAL_COMMUNITY): Payer: Self-pay

## 2015-10-20 DIAGNOSIS — M859 Disorder of bone density and structure, unspecified: Secondary | ICD-10-CM | POA: Diagnosis not present

## 2015-10-20 DIAGNOSIS — N183 Chronic kidney disease, stage 3 (moderate): Secondary | ICD-10-CM | POA: Diagnosis not present

## 2015-10-20 DIAGNOSIS — E042 Nontoxic multinodular goiter: Secondary | ICD-10-CM | POA: Diagnosis not present

## 2015-10-20 DIAGNOSIS — Z7984 Long term (current) use of oral hypoglycemic drugs: Secondary | ICD-10-CM | POA: Diagnosis not present

## 2015-10-20 DIAGNOSIS — M8588 Other specified disorders of bone density and structure, other site: Secondary | ICD-10-CM | POA: Diagnosis not present

## 2015-10-20 DIAGNOSIS — E119 Type 2 diabetes mellitus without complications: Secondary | ICD-10-CM | POA: Diagnosis not present

## 2015-10-20 DIAGNOSIS — E785 Hyperlipidemia, unspecified: Secondary | ICD-10-CM | POA: Diagnosis not present

## 2015-10-23 ENCOUNTER — Encounter (HOSPITAL_COMMUNITY)
Admission: RE | Admit: 2015-10-23 | Discharge: 2015-10-23 | Disposition: A | Discharge status: Home / Self Care | Payer: Medicare Other | Source: Ambulatory Visit | Attending: Cardiovascular Disease | Admitting: Cardiovascular Disease

## 2015-10-25 ENCOUNTER — Encounter (HOSPITAL_COMMUNITY): Payer: Self-pay

## 2015-10-25 DIAGNOSIS — M8580 Other specified disorders of bone density and structure, unspecified site: Secondary | ICD-10-CM | POA: Diagnosis not present

## 2015-10-25 DIAGNOSIS — E042 Nontoxic multinodular goiter: Secondary | ICD-10-CM | POA: Diagnosis not present

## 2015-10-25 DIAGNOSIS — I2510 Atherosclerotic heart disease of native coronary artery without angina pectoris: Secondary | ICD-10-CM | POA: Diagnosis not present

## 2015-10-25 DIAGNOSIS — Z5181 Encounter for therapeutic drug level monitoring: Secondary | ICD-10-CM | POA: Diagnosis not present

## 2015-10-25 DIAGNOSIS — E119 Type 2 diabetes mellitus without complications: Secondary | ICD-10-CM | POA: Diagnosis not present

## 2015-10-27 ENCOUNTER — Encounter (HOSPITAL_COMMUNITY)
Admission: RE | Admit: 2015-10-27 | Discharge: 2015-10-27 | Disposition: A | Discharge status: Home / Self Care | Payer: Medicare Other | Source: Ambulatory Visit | Attending: Cardiovascular Disease | Admitting: Cardiovascular Disease

## 2015-10-30 ENCOUNTER — Encounter (HOSPITAL_COMMUNITY): Payer: Self-pay

## 2015-10-30 ENCOUNTER — Ambulatory Visit (INDEPENDENT_AMBULATORY_CARE_PROVIDER_SITE_OTHER): Payer: Medicare Other | Admitting: Cardiovascular Disease

## 2015-10-30 ENCOUNTER — Encounter: Payer: Self-pay | Admitting: Cardiovascular Disease

## 2015-10-30 VITALS — BP 116/80 | HR 62 | Ht 64.5 in | Wt 149.0 lb

## 2015-10-30 DIAGNOSIS — I6523 Occlusion and stenosis of bilateral carotid arteries: Secondary | ICD-10-CM | POA: Diagnosis not present

## 2015-10-30 DIAGNOSIS — E785 Hyperlipidemia, unspecified: Secondary | ICD-10-CM

## 2015-10-30 DIAGNOSIS — I2510 Atherosclerotic heart disease of native coronary artery without angina pectoris: Secondary | ICD-10-CM | POA: Diagnosis not present

## 2015-10-30 MED ORDER — CLOPIDOGREL BISULFATE 75 MG PO TABS: 75.0000 mg | ORAL_TABLET | Freq: Every day | ORAL | 3 refills | Status: DC

## 2015-10-30 MED ORDER — METOPROLOL SUCCINATE ER 25 MG PO TB24: 25.0000 mg | ORAL_TABLET | Freq: Every day | ORAL | 3 refills | Status: DC

## 2015-10-30 MED ORDER — ROSUVASTATIN CALCIUM 10 MG PO TABS: 10.0000 mg | ORAL_TABLET | Freq: Every day | ORAL | 3 refills | Status: DC

## 2015-10-30 MED ORDER — RAMIPRIL 2.5 MG PO CAPS: ORAL_CAPSULE | ORAL | 3 refills | Status: DC

## 2015-10-30 MED ORDER — HYDROCHLOROTHIAZIDE 12.5 MG PO CAPS: 12.5000 mg | ORAL_CAPSULE | Freq: Every day | ORAL | 3 refills | Status: DC

## 2015-10-30 NOTE — Patient Instructions (Signed)

## 2015-10-30 NOTE — Progress Notes (Signed)
Cardiology Office Note Date:  10/30/2015   ID:  Stacy [name], DOB Feb 15, 1926, MRN [id number]  PCP:  Lillard Anes, MD  Cardiologist:  Sherren Mocha, MD    Chief Complaint  Patient presents with  . Carotid Artery Disease   History of Present Illness: Stacy [name] is a 82 y.o. female who presents for follow-up evaluation. The patient is followed for coronary artery disease.  She initially presented in 1997 with an anterior MI treated with balloon angioplasty of the LAD. She subsequently underwent stenting of the right coronary artery in 2000. Because of recurrent symptoms, she underwent repeat heart catheterization in 2008 and this demonstrated patency of her previous angioplasty and stent sites.  The patient also has carotid artery stenosis and she underwent right carotid endarterectomy earlier this year by Dr. Trula Slade. Her surgery was uncomplicated. She presents today for scheduled follow-up evaluation. She continues to participating regularly in the maintenance phase of cardiac rehabilitation.  The patient is doing well from a cardiac perspective. She's had a mild droop of the right side of her mouth since her carotid surgery but this is improving. She had an uncomplicated surgery and was discharged on hospital day #1. She continues to participating in cardiac rehabilitation without exertional symptoms. She admits to an occasional pain in the interscapular area unrelated to physical activity. Today, she denies symptoms of palpitations, chest pain, shortness of breath, orthopnea, PND, lower extremity edema, dizziness, or syncope.  She was recently seen by Dr. Buddy Duty. Hemoglobin A1c is 7.2. Cholesterol is 131, LDL 51, HDL 48, triglycerides 159. Initiation of Jardiance was recommended - she is reluctant to change medicines as she feels very well and has been on the same medical program for some time.    Past Medical History:  Diagnosis Date  . Anxiety   . Arthritis    Cervical DDD  C5-6 and C6-7  . Atheroembolism   . Blue toe syndrome ([place])   . CAD (coronary artery disease)   . Cancer ([place])    skin BCC  Forehead and  SCC Left Lower Leg  . Carotid artery occlusion   . Chronic kidney disease, stage III (moderate)   . DDD (degenerative disc disease)   . Depression   . DM (diabetes mellitus) ([place])   . Gallstones   . GERD (gastroesophageal reflux disease)   . Heart murmur   . Hemorrhoid   . Hemorrhoids   . Hiatal hernia   . History of shingles   . Hyperlipidemia   . Hypertension   . Leg pain   . Mild incontinence   . Multiple thyroid nodules    checked every year  . Myocardial infarction ([place]) 03/15/95  . Osteopenia   . Peptic ulcer disease   . Thyroid nodule     Past Surgical History:  Procedure Laterality Date  . APPENDECTOMY    . Basal Cell Cancer Removal     forehead  . CATARACT EXTRACTION    . CORONARY ANGIOPLASTY  03-15-1995  . CORONARY ANGIOPLASTY WITH STENT PLACEMENT  05-19-1998  . CORONARY STENT PLACEMENT  2000  . DILATION AND CURETTAGE OF UTERUS    . ENDARTERECTOMY Right 04/13/2015   Procedure: ENDARTERECTOMY CAROTID;  Surgeon: Serafina Mitchell, MD;  Location: [facility] OR;  Service: Vascular;  Laterality: Right;  . ENDOMETRIAL BIOPSY    . EYE SURGERY    . HEMORRHOID SURGERY    . INCISIONAL HERNIA REPAIR    . INGUINAL HERNIA REPAIR    . PATCH  ANGIOPLASTY Right 04/13/2015   Procedure: PATCH ANGIOPLASTY;  Surgeon: Serafina Mitchell, MD;  Location: [facility];  Service: Vascular;  Laterality: Right;  . PTCA    . SQUAMOUS CELL CARCINOMA EXCISION     left lower leg  . TONSILLECTOMY      Current Outpatient Prescriptions  Medication Sig Dispense Refill  . ACCU-CHEK SMARTVIEW test strip 1 each by Other route as needed for other (blood sugar test strips).   11  . acetaminophen (TYLENOL) 325 MG tablet Take 325 mg by mouth every 6 (six) hours as needed for mild pain or moderate pain.    Marland Kitchen aspirin 81 MG tablet Take 1 tablet (81 mg total) by mouth daily. 1 tablet    . cholecalciferol (VITAMIN D) 400 UNITS TABS Take 400 Units by mouth daily.     . clopidogrel (PLAVIX) 75 MG tablet Take 1 tablet (75 mg total) by mouth daily. 90 tablet 3  . Coenzyme Q10 (COQ-10 PO) Take 50 mg by mouth daily. 1 tab po qd    . conjugated estrogens (PREMARIN) vaginal cream 0.5 g once a week. .625 mg once weekly    . dexlansoprazole (DEXILANT) 60 MG capsule Take 60 mg by mouth daily as needed (for acid reflux). Take 1 tablet ( Dexilant DR ) by mouth once daily    . diphenhydrAMINE (BENADRYL) 25 mg capsule Take 25 mg by mouth as needed for allergies.     . diphenhydramine-acetaminophen (TYLENOL PM) 25-500 MG TABS tablet Take 1 tablet by mouth at bedtime as needed (for sleep).     . hydrochlorothiazide (MICROZIDE) 12.5 MG capsule Take 1 capsule (12.5 mg total) by mouth daily. 90 capsule 3  . hydrocortisone 2.5 % cream Apply 1 application topically daily as needed (for itching).   1  . Melatonin 5 MG CAPS Take 1 capsule by mouth at bedtime as needed.     . metoprolol succinate (TOPROL-XL) 25 MG 24 hr tablet Take 1 tablet (25 mg total) by mouth daily. 90 tablet 3  . Multiple Vitamins-Minerals (CENTRUM SILVER PO) Take 1 tablet by mouth daily. 1 tab po qd     . NEOMYCIN-POLYMYXIN-HYDROCORTISONE (CORTISPORIN) 1 % SOLN otic solution Place 3 drops into both ears as needed (place two drops into each ear as needed for itching).   3  . nitroGLYCERIN (NITROSTAT) 0.4 MG SL tablet Place 1 tablet (0.4 mg total) under the tongue every 5 (five) minutes as needed for chest pain. 25 tablet 2  . Polyethylene Glycol 400 (BLINK TEARS OP) Place 1 drop into both eyes daily as needed (for). Reported on 05/05/2015    . polyethylene glycol powder (GLYCOLAX/MIRALAX) powder Take 17 g by mouth daily. Reported on 06/12/2015    . ramipril (ALTACE) 2.5 MG capsule TAKE 1 CAPSULE (2.5 MG TOTAL) BY MOUTH 2 (TWO) TIMES DAILY. 180 capsule 3  . rosuvastatin (CRESTOR) 10 MG tablet Take 1 tablet (10 mg total) by mouth daily.  90 tablet 3  . sertraline (ZOLOFT) 25 MG tablet Take 25 mg by mouth daily.     Marland Kitchen trolamine salicylate (ASPERCREME) 10 % cream Apply 1 application topically as needed for muscle pain.     No current facility-administered medications for this visit.     Allergies:   Iohexol; Sulfonamide derivatives; Latex; and Tranilast   Social History:  The patient  reports that she quit smoking about 17 years ago. Her smoking use included Cigarettes. She has a 54.00 pack-year smoking history. She has never used smokeless  tobacco. She reports that she does not drink alcohol or use drugs.   Family History:  The patient's  family history includes Cancer in her father; Diabetes in her father; Hypertension in her paternal uncle; Parkinsonism in her mother; Prostate cancer in her father; Stroke in her paternal uncle.    ROS:  Please see the history of present illness.  Otherwise, review of systems is positive for back pain.  All other systems are reviewed and negative.    PHYSICAL EXAM: VS:  BP 116/80   Pulse 62   Ht 5' 4.5" (1.638 m)   Wt 67.6 kg (149 lb)   BMI 25.18 kg/m  , BMI Body mass index is 25.18 kg/m. GEN: Well nourished, well developed, in no acute distress  HEENT: normal  Neck: no JVD, no masses. No carotid bruits Cardiac: RRR without murmur or gallop                Respiratory:  clear to auscultation bilaterally, normal work of breathing GI: soft, nontender, nondistended, + BS MS: no deformity or atrophy  Ext: no pretibial edema, pedal pulses 2+= bilaterally Skin: warm and dry, no rash Neuro:  Strength and sensation are intact Psych: euthymic mood, full affect  EKG:  EKG is ordered today. The ekg ordered today shows normal sinus rhythm with right bundle branch block, age indeterminate inferior infarct, no change from previous tracings  Recent Labs: 04/05/2015: ALT 28 04/14/2015: BUN 12; Creatinine, Ser 0.96; Hemoglobin 11.2; Platelets 171; Potassium 4.0; Sodium 141   Lipid Panel       Component Value Date/Time   CHOL 136 01/16/2012 0958   TRIG 154.0 (H) 01/16/2012 0958   HDL 40.60 01/16/2012 0958   CHOLHDL 3 01/16/2012 0958   VLDL 30.8 01/16/2012 0958   LDLCALC 65 01/16/2012 0958      Wt Readings from Last 3 Encounters:  10/30/15 67.6 kg (149 lb)  09/04/15 65.2 kg (143 lb 12.8 oz)  05/05/15 65.3 kg (144 lb)    ASSESSMENT AND PLAN: 1.  Coronary artery disease, native vessel, without symptoms of angina:The patient is doing well on her current medical program. She remains on dual antiplatelet therapy with aspirin and Plavix. Her risk reduction program also includes metoprolol, ramipril, and Crestor. No changes recommended today.  2. Hyperlipidemia: Treated with Crestor 10 mg daily. Lipids reviewed as above from Dr. Cindra Eves office and LDL is at goal of less than 70 mg/dL.  3. Essential hypertension: Blood pressure is well controlled on current medical regimen. She does not wish to make any medication changes and will continue on her current regimen which includes hydrochlorothiazide, metoprolol succinate, and ramipril.  4. Carotid artery disease: The patient has done very well with right carotid endarterectomy. Her incision appears to be well-healed. Symptoms affecting the right side of her mouth are improving. She has scheduled follow-up with Dr. Trula Slade.  5. Type 2 diabetes: Note of Dr. Buddy Duty reviewed reviewed. Hemoglobin A1c is 7.2 mg/dL.  Current medicines are reviewed with the patient today.  The patient does not have concerns regarding medicines.  Labs/ tests ordered today include:  No orders of the defined types were placed in this encounter.   Disposition:   FU 6 months  Signed, Sherren Mocha, MD  10/30/2015 8:48 AM    [address], [place], [place]  [address] Phone: [phone]; Fax: [phone]

## 2015-11-01 ENCOUNTER — Encounter (HOSPITAL_COMMUNITY)
Admission: RE | Admit: 2015-11-01 | Discharge: 2015-11-01 | Disposition: A | Discharge status: Home / Self Care | Payer: Self-pay | Source: Ambulatory Visit | Attending: Cardiovascular Disease | Admitting: Cardiovascular Disease

## 2015-11-03 ENCOUNTER — Encounter (HOSPITAL_COMMUNITY): Payer: Self-pay

## 2015-11-06 ENCOUNTER — Encounter (HOSPITAL_COMMUNITY): Payer: Self-pay

## 2015-11-08 ENCOUNTER — Encounter (HOSPITAL_COMMUNITY): Payer: Self-pay

## 2015-11-10 ENCOUNTER — Encounter (HOSPITAL_COMMUNITY): Payer: Self-pay

## 2015-11-13 ENCOUNTER — Encounter (HOSPITAL_COMMUNITY): Payer: Self-pay

## 2015-11-13 DIAGNOSIS — Z955 Presence of coronary angioplasty implant and graft: Secondary | ICD-10-CM | POA: Insufficient documentation

## 2015-11-13 DIAGNOSIS — I2510 Atherosclerotic heart disease of native coronary artery without angina pectoris: Secondary | ICD-10-CM | POA: Insufficient documentation

## 2015-11-15 ENCOUNTER — Encounter (HOSPITAL_COMMUNITY)
Admission: RE | Admit: 2015-11-15 | Discharge: 2015-11-15 | Disposition: A | Discharge status: Home / Self Care | Payer: Self-pay | Source: Ambulatory Visit | Attending: Cardiovascular Disease | Admitting: Cardiovascular Disease

## 2015-11-17 ENCOUNTER — Encounter (HOSPITAL_COMMUNITY)
Admission: RE | Admit: 2015-11-17 | Discharge: 2015-11-17 | Disposition: A | Discharge status: Home / Self Care | Payer: Self-pay | Source: Ambulatory Visit | Attending: Cardiovascular Disease | Admitting: Cardiovascular Disease

## 2015-11-20 ENCOUNTER — Encounter (HOSPITAL_COMMUNITY)
Admission: RE | Admit: 2015-11-20 | Discharge: 2015-11-20 | Disposition: A | Discharge status: Home / Self Care | Payer: Self-pay | Source: Ambulatory Visit | Attending: Cardiovascular Disease | Admitting: Cardiovascular Disease

## 2015-11-22 ENCOUNTER — Encounter (HOSPITAL_COMMUNITY)
Admission: RE | Admit: 2015-11-22 | Discharge: 2015-11-22 | Disposition: A | Discharge status: Home / Self Care | Payer: Medicare Other | Source: Ambulatory Visit | Attending: Cardiovascular Disease | Admitting: Cardiovascular Disease

## 2015-11-24 ENCOUNTER — Encounter (HOSPITAL_COMMUNITY)
Admission: RE | Admit: 2015-11-24 | Discharge: 2015-11-24 | Disposition: A | Discharge status: Home / Self Care | Payer: Self-pay | Source: Ambulatory Visit | Attending: Cardiovascular Disease | Admitting: Cardiovascular Disease

## 2015-11-24 DIAGNOSIS — Z23 Encounter for immunization: Secondary | ICD-10-CM | POA: Diagnosis not present

## 2015-11-27 ENCOUNTER — Encounter (HOSPITAL_COMMUNITY)
Admission: RE | Admit: 2015-11-27 | Discharge: 2015-11-27 | Disposition: A | Discharge status: Home / Self Care | Payer: Self-pay | Source: Ambulatory Visit | Attending: Cardiovascular Disease | Admitting: Cardiovascular Disease

## 2015-11-29 ENCOUNTER — Encounter (HOSPITAL_COMMUNITY)
Admission: RE | Admit: 2015-11-29 | Discharge: 2015-11-29 | Disposition: A | Discharge status: Home / Self Care | Payer: Self-pay | Source: Ambulatory Visit | Attending: Cardiovascular Disease | Admitting: Cardiovascular Disease

## 2015-12-01 ENCOUNTER — Encounter (HOSPITAL_COMMUNITY)
Admission: RE | Admit: 2015-12-01 | Discharge: 2015-12-01 | Disposition: A | Discharge status: Home / Self Care | Payer: Self-pay | Source: Ambulatory Visit | Attending: Cardiovascular Disease | Admitting: Cardiovascular Disease

## 2015-12-04 ENCOUNTER — Encounter (HOSPITAL_COMMUNITY): Payer: Self-pay

## 2015-12-06 ENCOUNTER — Encounter (HOSPITAL_COMMUNITY): Payer: Self-pay

## 2015-12-08 ENCOUNTER — Encounter (HOSPITAL_COMMUNITY): Payer: Self-pay

## 2015-12-11 ENCOUNTER — Encounter (HOSPITAL_COMMUNITY)
Admission: RE | Admit: 2015-12-11 | Discharge: 2015-12-11 | Disposition: A | Discharge status: Home / Self Care | Payer: Medicare Other | Source: Ambulatory Visit | Attending: Cardiovascular Disease | Admitting: Cardiovascular Disease

## 2015-12-12 DIAGNOSIS — E782 Mixed hyperlipidemia: Secondary | ICD-10-CM | POA: Diagnosis not present

## 2015-12-12 DIAGNOSIS — H35373 Puckering of macula, bilateral: Secondary | ICD-10-CM | POA: Diagnosis not present

## 2015-12-12 DIAGNOSIS — H26492 Other secondary cataract, left eye: Secondary | ICD-10-CM | POA: Diagnosis not present

## 2015-12-12 DIAGNOSIS — H524 Presbyopia: Secondary | ICD-10-CM | POA: Diagnosis not present

## 2015-12-12 DIAGNOSIS — E119 Type 2 diabetes mellitus without complications: Secondary | ICD-10-CM | POA: Diagnosis not present

## 2015-12-13 ENCOUNTER — Encounter (HOSPITAL_COMMUNITY): Payer: Self-pay

## 2015-12-13 DIAGNOSIS — Z955 Presence of coronary angioplasty implant and graft: Secondary | ICD-10-CM | POA: Insufficient documentation

## 2015-12-13 DIAGNOSIS — I2510 Atherosclerotic heart disease of native coronary artery without angina pectoris: Secondary | ICD-10-CM | POA: Insufficient documentation

## 2015-12-14 DIAGNOSIS — E1129 Type 2 diabetes mellitus with other diabetic kidney complication: Secondary | ICD-10-CM | POA: Diagnosis not present

## 2015-12-14 DIAGNOSIS — E785 Hyperlipidemia, unspecified: Secondary | ICD-10-CM | POA: Diagnosis not present

## 2015-12-14 DIAGNOSIS — I2510 Atherosclerotic heart disease of native coronary artery without angina pectoris: Secondary | ICD-10-CM | POA: Diagnosis not present

## 2015-12-14 DIAGNOSIS — I10 Essential (primary) hypertension: Secondary | ICD-10-CM | POA: Diagnosis not present

## 2015-12-14 DIAGNOSIS — N183 Chronic kidney disease, stage 3 (moderate): Secondary | ICD-10-CM | POA: Diagnosis not present

## 2015-12-15 ENCOUNTER — Encounter (HOSPITAL_COMMUNITY)
Admission: RE | Admit: 2015-12-15 | Discharge: 2015-12-15 | Disposition: A | Discharge status: Home / Self Care | Payer: Self-pay | Source: Ambulatory Visit | Attending: Cardiovascular Disease | Admitting: Cardiovascular Disease

## 2015-12-18 ENCOUNTER — Encounter (HOSPITAL_COMMUNITY)
Admission: RE | Admit: 2015-12-18 | Discharge: 2015-12-18 | Disposition: A | Discharge status: Home / Self Care | Payer: Self-pay | Source: Ambulatory Visit | Attending: Cardiovascular Disease | Admitting: Cardiovascular Disease

## 2015-12-22 ENCOUNTER — Encounter (HOSPITAL_COMMUNITY)
Admission: RE | Admit: 2015-12-22 | Discharge: 2015-12-22 | Disposition: A | Discharge status: Home / Self Care | Payer: Self-pay | Source: Ambulatory Visit | Attending: Cardiovascular Disease | Admitting: Cardiovascular Disease

## 2015-12-27 ENCOUNTER — Encounter (HOSPITAL_COMMUNITY)
Admission: RE | Admit: 2015-12-27 | Discharge: 2015-12-27 | Disposition: A | Discharge status: Home / Self Care | Payer: Self-pay | Source: Ambulatory Visit | Attending: Cardiovascular Disease | Admitting: Cardiovascular Disease

## 2015-12-27 ENCOUNTER — Encounter: Payer: Self-pay | Admitting: Surgery

## 2015-12-28 DIAGNOSIS — N390 Urinary tract infection, site not specified: Secondary | ICD-10-CM | POA: Diagnosis not present

## 2015-12-29 ENCOUNTER — Encounter (HOSPITAL_COMMUNITY)
Admission: RE | Admit: 2015-12-29 | Discharge: 2015-12-29 | Disposition: A | Discharge status: Home / Self Care | Payer: Self-pay | Source: Ambulatory Visit | Attending: Cardiovascular Disease | Admitting: Cardiovascular Disease

## 2016-01-01 ENCOUNTER — Encounter (HOSPITAL_COMMUNITY)
Admission: RE | Admit: 2016-01-01 | Discharge: 2016-01-01 | Disposition: A | Discharge status: Home / Self Care | Payer: Self-pay | Source: Ambulatory Visit | Attending: Cardiovascular Disease | Admitting: Cardiovascular Disease

## 2016-01-03 ENCOUNTER — Encounter (HOSPITAL_COMMUNITY)
Admission: RE | Admit: 2016-01-03 | Discharge: 2016-01-03 | Disposition: A | Discharge status: Home / Self Care | Payer: Self-pay | Source: Ambulatory Visit | Attending: Cardiovascular Disease | Admitting: Cardiovascular Disease

## 2016-01-03 ENCOUNTER — Ambulatory Visit (HOSPITAL_COMMUNITY): Payer: Medicare Other

## 2016-01-08 ENCOUNTER — Encounter (HOSPITAL_COMMUNITY)
Admission: RE | Admit: 2016-01-08 | Discharge: 2016-01-08 | Disposition: A | Discharge status: Home / Self Care | Payer: Self-pay | Source: Ambulatory Visit | Attending: Cardiovascular Disease | Admitting: Cardiovascular Disease

## 2016-01-08 ENCOUNTER — Encounter (HOSPITAL_COMMUNITY): Payer: Self-pay

## 2016-01-08 ENCOUNTER — Ambulatory Visit: Payer: Self-pay | Admitting: Surgery

## 2016-01-10 ENCOUNTER — Encounter (HOSPITAL_COMMUNITY)
Admission: RE | Admit: 2016-01-10 | Discharge: 2016-01-10 | Disposition: A | Discharge status: Home / Self Care | Payer: Self-pay | Source: Ambulatory Visit | Attending: Cardiovascular Disease | Admitting: Cardiovascular Disease

## 2016-01-12 ENCOUNTER — Encounter (HOSPITAL_COMMUNITY)
Admission: RE | Admit: 2016-01-12 | Discharge: 2016-01-12 | Disposition: A | Discharge status: Home / Self Care | Payer: Self-pay | Source: Ambulatory Visit | Attending: Cardiovascular Disease | Admitting: Cardiovascular Disease

## 2016-01-12 DIAGNOSIS — I2510 Atherosclerotic heart disease of native coronary artery without angina pectoris: Secondary | ICD-10-CM | POA: Insufficient documentation

## 2016-01-15 ENCOUNTER — Encounter (HOSPITAL_COMMUNITY)
Admission: RE | Admit: 2016-01-15 | Discharge: 2016-01-15 | Disposition: A | Discharge status: Home / Self Care | Payer: Self-pay | Source: Ambulatory Visit | Attending: Cardiovascular Disease | Admitting: Cardiovascular Disease

## 2016-01-17 ENCOUNTER — Encounter: Payer: Self-pay | Admitting: Surgery

## 2016-01-17 ENCOUNTER — Encounter (HOSPITAL_COMMUNITY)
Admission: RE | Admit: 2016-01-17 | Discharge: 2016-01-17 | Disposition: A | Discharge status: Home / Self Care | Payer: Self-pay | Source: Ambulatory Visit | Attending: Cardiovascular Disease | Admitting: Cardiovascular Disease

## 2016-01-19 ENCOUNTER — Encounter (HOSPITAL_COMMUNITY): Payer: Self-pay

## 2016-01-22 ENCOUNTER — Encounter (HOSPITAL_COMMUNITY): Payer: Self-pay

## 2016-01-24 ENCOUNTER — Encounter (HOSPITAL_COMMUNITY)
Admission: RE | Admit: 2016-01-24 | Discharge: 2016-01-24 | Disposition: A | Discharge status: Home / Self Care | Payer: Self-pay | Source: Ambulatory Visit | Attending: Cardiovascular Disease | Admitting: Cardiovascular Disease

## 2016-01-26 ENCOUNTER — Ambulatory Visit (INDEPENDENT_AMBULATORY_CARE_PROVIDER_SITE_OTHER): Payer: Medicare Other | Admitting: Surgery

## 2016-01-26 ENCOUNTER — Ambulatory Visit (HOSPITAL_COMMUNITY)
Admission: RE | Admit: 2016-01-26 | Discharge: 2016-01-26 | Disposition: A | Discharge status: Home / Self Care | Payer: Medicare Other | Source: Ambulatory Visit | Attending: Surgery | Admitting: Surgery

## 2016-01-26 ENCOUNTER — Encounter: Payer: Self-pay | Admitting: Surgery

## 2016-01-26 VITALS — BP 128/69 | HR 59 | Temp 98.4°F | Resp 16 | Ht 64.5 in | Wt 146.0 lb

## 2016-01-26 DIAGNOSIS — I6523 Occlusion and stenosis of bilateral carotid arteries: Secondary | ICD-10-CM

## 2016-01-26 DIAGNOSIS — I6521 Occlusion and stenosis of right carotid artery: Secondary | ICD-10-CM

## 2016-01-26 DIAGNOSIS — Z9889 Other specified postprocedural states: Secondary | ICD-10-CM | POA: Diagnosis not present

## 2016-01-26 LAB — VAS US CAROTID
LCCADDIAS: 13 cm/s
LCCAPSYS: 95 cm/s
LEFT ECA DIAS: 10 cm/s
LICADDIAS: -20 cm/s
LICADSYS: -71 cm/s
LICAPDIAS: 14 cm/s
LICAPSYS: 62 cm/s
Left CCA dist sys: 62 cm/s
Left CCA prox dias: 16 cm/s
RCCAPSYS: -80 cm/s
RIGHT CCA MID DIAS: 14 cm/s
RIGHT ECA DIAS: -19 cm/s
Right CCA prox dias: -9 cm/s
Right cca dist sys: -70 cm/s

## 2016-01-26 NOTE — Progress Notes (Signed)
HISTORY AND PHYSICAL     CC:  Follow up visit with carotid ultrasound Referring Provider:  Lillard Anes,*  HPI: This is a 82 y.o. female who is s/p right carotid endarterectomy with patch angioplasty for asymptomatic carotid artery stenosis on 04/13/15.  She returns today for follow up.  She states that she is doing very well.  She states the drooping of her lip has resolved and she isn't biting her lip anymore.  She states she has not had any temporary blindness, speech difficulties or numbness or weakness of any extremity.  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    She is on a statin for cholesterol management.  She is on aspirin and Plavix daily.  She is on a beta blocker and ACEI for blood pressure management.    Past Medical History:  Diagnosis Date  . Anxiety   . Arthritis    Cervical DDD C5-6 and C6-7  . Atheroembolism   . Blue toe syndrome ([place])   . CAD (coronary artery disease)   . Cancer ([place])    skin BCC  Forehead and  SCC Left Lower Leg  . Carotid artery occlusion   . Chronic kidney disease, stage III (moderate)   . DDD (degenerative disc disease)   . Depression   . DM (diabetes mellitus) ([place])   . Gallstones   . GERD (gastroesophageal reflux disease)   . Heart murmur   . Hemorrhoid   . Hemorrhoids   . Hiatal hernia   . History of shingles   . Hyperlipidemia   . Hypertension   . Leg pain   . Mild incontinence   . Multiple thyroid nodules    checked every year  . Myocardial infarction 03/15/95  . Osteopenia   . Peptic ulcer disease   . Thyroid nodule     Past Surgical History:  Procedure Laterality Date  . APPENDECTOMY    . Basal Cell Cancer Removal     forehead  . CATARACT EXTRACTION    . CORONARY ANGIOPLASTY  03-15-1995  . CORONARY ANGIOPLASTY WITH STENT PLACEMENT  05-19-1998  . CORONARY STENT PLACEMENT  2000  . DILATION AND CURETTAGE OF UTERUS    . ENDARTERECTOMY Right 04/13/2015   Procedure: ENDARTERECTOMY CAROTID;   Surgeon: Serafina Mitchell, MD;  Location: [facility] OR;  Service: Vascular;  Laterality: Right;  . ENDOMETRIAL BIOPSY    . EYE SURGERY    . HEMORRHOID SURGERY    . INCISIONAL HERNIA REPAIR    . INGUINAL HERNIA REPAIR    . PATCH ANGIOPLASTY Right 04/13/2015   Procedure: PATCH ANGIOPLASTY;  Surgeon: Serafina Mitchell, MD;  Location: [place];  Service: Vascular;  Laterality: Right;  . PTCA    . SQUAMOUS CELL CARCINOMA EXCISION     left lower leg  . TONSILLECTOMY      Allergies  Allergen Reactions  . Iohexol Other (See Comments)     Desc: HIVES- 13 HR PRE-MEDS ARE REQUIRED-ASM- 03/21/05,SULFA,ADHESIVE TAPE   . Sulfonamide Derivatives Hives    REACTION: hives  . Latex Other (See Comments)    Adhesive tape and EKG adhesive leads to rash  . Tranilast Hives    Investigational medication    Current Outpatient Prescriptions  Medication Sig Dispense Refill  . ACCU-CHEK SMARTVIEW test strip 1 each by Other route as needed for other (blood sugar test strips).   11  . acetaminophen (TYLENOL) 325 MG tablet Take 325 mg by mouth every 6 (six)  hours as needed for mild pain or moderate pain.    Marland Kitchen aspirin 81 MG tablet Take 1 tablet (81 mg total) by mouth daily. 1 tablet   . Calcium Carbonate Antacid (TUMS PO) Take by mouth.    . Cetirizine HCl (ZYRTEC PO) Take by mouth.    . cholecalciferol (VITAMIN D) 400 UNITS TABS Take 400 Units by mouth daily.     . clopidogrel (PLAVIX) 75 MG tablet Take 1 tablet (75 mg total) by mouth daily. 90 tablet 3  . Coenzyme Q10 (COQ-10 PO) Take 50 mg by mouth daily. 1 tab po qd    . conjugated estrogens (PREMARIN) vaginal cream 0.5 g once a week. .625 mg once weekly    . dexlansoprazole (DEXILANT) 60 MG capsule Take 60 mg by mouth daily as needed (for acid reflux). Take 1 tablet ( Dexilant DR ) by mouth once daily    . diphenhydramine-acetaminophen (TYLENOL PM) 25-500 MG TABS tablet Take 1 tablet by mouth at bedtime as needed (for sleep).     . hydrochlorothiazide (MICROZIDE) 12.5  MG capsule Take 1 capsule (12.5 mg total) by mouth daily. 90 capsule 3  . Melatonin 5 MG CAPS Take 1 capsule by mouth at bedtime as needed.     . metoprolol succinate (TOPROL-XL) 25 MG 24 hr tablet Take 1 tablet (25 mg total) by mouth daily. 90 tablet 3  . Multiple Vitamins-Minerals (CENTRUM SILVER PO) Take 1 tablet by mouth daily. 1 tab po qd     . NEOMYCIN-POLYMYXIN-HYDROCORTISONE (CORTISPORIN) 1 % SOLN otic solution Place 3 drops into both ears as needed (place two drops into each ear as needed for itching).   3  . nitroGLYCERIN (NITROSTAT) 0.4 MG SL tablet Place 1 tablet (0.4 mg total) under the tongue every 5 (five) minutes as needed for chest pain. 25 tablet 2  . Polyethylene Glycol 400 (BLINK TEARS OP) Place 1 drop into both eyes daily as needed (for). Reported on 05/05/2015    . polyethylene glycol powder (GLYCOLAX/MIRALAX) powder Take 17 g by mouth daily. Reported on 06/12/2015    . ramipril (ALTACE) 2.5 MG capsule TAKE 1 CAPSULE (2.5 MG TOTAL) BY MOUTH 2 (TWO) TIMES DAILY. 180 capsule 3  . rosuvastatin (CRESTOR) 10 MG tablet Take 1 tablet (10 mg total) by mouth daily. 90 tablet 3  . sertraline (ZOLOFT) 25 MG tablet Take 25 mg by mouth daily.     Marland Kitchen trolamine salicylate (ASPERCREME) 10 % cream Apply 1 application topically as needed for muscle pain.    . diphenhydrAMINE (BENADRYL) 25 mg capsule Take 25 mg by mouth as needed for allergies.     . hydrocortisone 2.5 % cream Apply 1 application topically daily as needed (for itching).   1   No current facility-administered medications for this visit.     Family History  Problem Relation Age of Onset  . Diabetes Father   . Prostate cancer Father     meta to colon  . Cancer Father     Prostate  . Parkinsonism Mother   . Stroke Paternal Uncle   . Hypertension Paternal Uncle   . Heart attack Neg Hx     Social History   Social History  . Marital status: Widowed    Spouse name: N/A  . Number of children: 1  . Years of education: N/A     Occupational History  . Retired Retired    [employer]   Social History Main Topics  . Smoking status: Former  Smoker    Packs/day: 1.00    Years: 54.00    Types: Cigarettes    Quit date: 10/05/1998  . Smokeless tobacco: Never Used  . Alcohol use No  . Drug use: No  . Sexual activity: No   Other Topics Concern  . Not on file   Social History Narrative  . No narrative on file     REVIEW OF SYSTEMS:   _0  denotes positive finding, _1  denotes negative finding Cardiac  Comments:  Chest pain or chest pressure:    Shortness of breath upon exertion:    Short of breath when lying flat:    Irregular heart rhythm:        Vascular    Pain in calf, thigh, or hip brought on by ambulation: x Hx sciatica-right  Pain in feet at night that wakes you up from your sleep:     Blood clot in your veins:    Leg swelling:         Pulmonary    Oxygen at home:    Productive cough:     Wheezing:         Neurologic    Sudden weakness in arms or legs:     Sudden numbness in arms or legs:     Sudden onset of difficulty speaking or slurred speech:    Temporary loss of vision in one eye:     Problems with dizziness:         Gastrointestinal    Blood in stool:     Vomited blood:         Genitourinary    Burning when urinating:     Blood in urine:        Psychiatric    Major depression:         Hematologic    Bleeding problems:    Problems with blood clotting too easily:        Skin    Rashes or ulcers:        Constitutional    Fever or chills:      PHYSICAL EXAMINATION:  Vitals:   01/26/16 1547 01/26/16 1548  BP: 126/79 128/69  Pulse: (!) 59   Resp: 16   Temp: 98.4 F (36.9 C)    Body mass index is 24.67 kg/m.  General:  WDWN in NAD; vital signs documented above Gait: Not observed HENT: WNL, normocephalic Pulmonary: normal non-labored breathing , without Rales, rhonchi,  wheezing Cardiac: regular HR, without  Murmurs, rubs or gallops; without carotid  bruits Abdomen: soft, NT, no masses Skin: without rashes; well healed scar right neck Vascular Exam/Pulses:  Right Left  Radial 2+ (normal) 2+ (normal)   Extremities: without ischemic changes, without Gangrene , without cellulitis; without open wounds;  Musculoskeletal: no muscle wasting or atrophy  Neurologic: A&O X 3;  No focal weakness or paresthesias are detected Psychiatric:  The pt has Normal affect.   Non-Invasive Vascular Imaging:   Carotid duplex evaluation 01/26/16: -Patent right carotid endarterectomy site with no bilateral proximal internal carotid artery stenoses. -no significant change from previous exam 09/04/15  Pt meds includes: Statin:  Yes.   Beta Blocker:  Yes.   Aspirin:  Yes.   ACEI:  Yes.   ARB:  No. CCB use:  No Other Antiplatelet/Anticoagulant:  Yes Plavix   ASSESSMENT/PLAN:: 82 y.o. female s/p right carotid endarterectomy 04/13/15 for asymptomatic carotid artery stenosis by Dr. Trula Slade who returns today for follow up.   -pt is doing quite  well and remains asymptomatic. -her marginal mandibular palsy has resolved -she will return in one year with carotid duplex - she will call sooner if needed. -she is on a statin/asprin   Leontine Locket, PA-C [facility] [phone]  Clinic MD:  Pt seen and examined in conjunction with Dr. Trula Slade  VASCULAR QUALITY INITIATIVE FOLLOW UP DATA:  Current smoker: [  ] yes  [ x ] no  Living status: [ x ]  Home  [  ] Nursing home  [  ] Homeless    MEDS:  ASA [ x ] yes  [  ] no- [  ] medical reason  [  ] non compliant  STATIN  [ x ] yes  [  ] no- [  ] medical reason  [  ] non compliant  Beta blocker [x  ] yes  [  ] no- [  ] medical reason  [  ] non compliant  ACE inhibitor [ x ] yes  [  ] no- [  ] medical reason  [  ] non compliant  P2Y12 Antagonist [  ] none  [  x] clopidogrel-Plavix  [  ] ticlopidine-Ticlid   [  ] prasugrel-Effient  [  ] ticagrelor- Brilinta    Anticoagulant [  x] None   [  ] warfarin  [  ] rivaroxaban-Xarelto [  ] dabigatran- Pradaxa  Neurologic event since D/C:  [ x ] no  [  ] yes: [  ] eye event  [  ] cortical event  [  ] VB event  [  ] non specific event  [  ] right  [  ] left  [  ] TIA  [  ] stroke  Date:   Modified Rankin Score: 0  MI since D/C: [  x] no  [  ] troponin only  [  ] EKG or clinical  Cranial nerve injury: [ x ] none  [  ] resolved  [  ] persistent  Duplex CEA site: [  ] no  [  ] yes - PSV= 103  EDV= 23   Stenosis= [  x] <40% [  ] 40-59% [  ] 60-79%  [  ] > 80%  [  ]  Occluded  CEA site re-operation:  [ x ] no   [  ] yes- date of re-op:  CEA site PCI:   [  x] no   [  ] yes- date of PCI:   I agree with the above.  I have seen and evaluated the patient.  She is s/p right carotid endarterectomy with patch angioplasty for asymptomatic carotid artery stenosis on 04/13/15.  She initially had symptoms with a marginal mandibular neurapraxia which have resolved.  She is back at normal activity level without complaints.  She will be placed on surveillance protocol in follow-up in one year  [facility] Sybol Morre

## 2016-01-29 ENCOUNTER — Encounter (HOSPITAL_COMMUNITY)
Admission: RE | Admit: 2016-01-29 | Discharge: 2016-01-29 | Disposition: A | Discharge status: Home / Self Care | Payer: Self-pay | Source: Ambulatory Visit | Attending: Cardiovascular Disease | Admitting: Cardiovascular Disease

## 2016-01-30 NOTE — Addendum Note (Signed)
Addended by: Lianne Cure A on: 01/30/2016 11:53 AM   Modules accepted: Orders

## 2016-01-31 ENCOUNTER — Encounter (HOSPITAL_COMMUNITY)
Admission: RE | Admit: 2016-01-31 | Discharge: 2016-01-31 | Disposition: A | Discharge status: Home / Self Care | Payer: Self-pay | Source: Ambulatory Visit | Attending: Cardiovascular Disease | Admitting: Cardiovascular Disease

## 2016-02-02 ENCOUNTER — Encounter (HOSPITAL_COMMUNITY)
Admission: RE | Admit: 2016-02-02 | Discharge: 2016-02-02 | Disposition: A | Discharge status: Home / Self Care | Payer: Self-pay | Source: Ambulatory Visit | Attending: Cardiovascular Disease | Admitting: Cardiovascular Disease

## 2016-02-07 ENCOUNTER — Encounter (HOSPITAL_COMMUNITY)
Admission: RE | Admit: 2016-02-07 | Discharge: 2016-02-07 | Disposition: A | Discharge status: Home / Self Care | Payer: Self-pay | Source: Ambulatory Visit | Attending: Cardiovascular Disease | Admitting: Cardiovascular Disease

## 2016-02-09 ENCOUNTER — Encounter (HOSPITAL_COMMUNITY): Payer: Self-pay

## 2016-02-14 ENCOUNTER — Encounter (HOSPITAL_COMMUNITY)
Admission: RE | Admit: 2016-02-14 | Discharge: 2016-02-14 | Disposition: A | Discharge status: Home / Self Care | Payer: Self-pay | Source: Ambulatory Visit | Attending: Cardiovascular Disease | Admitting: Cardiovascular Disease

## 2016-02-14 DIAGNOSIS — I2510 Atherosclerotic heart disease of native coronary artery without angina pectoris: Secondary | ICD-10-CM | POA: Insufficient documentation

## 2016-02-16 ENCOUNTER — Encounter (HOSPITAL_COMMUNITY): Payer: Self-pay

## 2016-02-19 ENCOUNTER — Encounter (HOSPITAL_COMMUNITY): Payer: Self-pay

## 2016-02-21 ENCOUNTER — Encounter (HOSPITAL_COMMUNITY)
Admission: RE | Admit: 2016-02-21 | Discharge: 2016-02-21 | Disposition: A | Discharge status: Home / Self Care | Payer: Self-pay | Source: Ambulatory Visit | Attending: Cardiovascular Disease | Admitting: Cardiovascular Disease

## 2016-02-21 ENCOUNTER — Encounter: Payer: Self-pay | Admitting: Cardiovascular Disease

## 2016-02-21 NOTE — Telephone Encounter (Signed)
New Message  Pts daughter voiced wanting to see Burt Knack in March and not a PA/APP.  Pt would like for nurse to give her a call regarding pt seeing MD-Cooper in March if possible.  Please f/u

## 2016-02-21 NOTE — Telephone Encounter (Signed)
This encounter was created in error - please disregard.

## 2016-02-23 ENCOUNTER — Encounter (HOSPITAL_COMMUNITY)
Admission: RE | Admit: 2016-02-23 | Discharge: 2016-02-23 | Disposition: A | Discharge status: Home / Self Care | Payer: Self-pay | Source: Ambulatory Visit | Attending: Cardiovascular Disease | Admitting: Cardiovascular Disease

## 2016-02-26 ENCOUNTER — Encounter (HOSPITAL_COMMUNITY)
Admission: RE | Admit: 2016-02-26 | Discharge: 2016-02-26 | Disposition: A | Discharge status: Home / Self Care | Payer: Self-pay | Source: Ambulatory Visit | Attending: Cardiovascular Disease | Admitting: Cardiovascular Disease

## 2016-02-28 ENCOUNTER — Encounter (HOSPITAL_COMMUNITY): Payer: Self-pay

## 2016-03-01 ENCOUNTER — Encounter (HOSPITAL_COMMUNITY): Payer: Self-pay

## 2016-03-02 ENCOUNTER — Emergency Department (INDEPENDENT_AMBULATORY_CARE_PROVIDER_SITE_OTHER)
Admission: EM | Admit: 2016-03-02 | Discharge: 2016-03-02 | Disposition: A | Discharge status: Home / Self Care | Payer: Medicare Other | Source: Home / Self Care | Attending: Family Medicine | Admitting: Family Medicine

## 2016-03-02 ENCOUNTER — Encounter: Payer: Self-pay | Admitting: Emergency Medicine

## 2016-03-02 ENCOUNTER — Emergency Department (HOSPITAL_BASED_OUTPATIENT_CLINIC_OR_DEPARTMENT_OTHER)
Admission: EM | Admit: 2016-03-02 | Discharge: 2016-03-02 | Disposition: A | Discharge status: Home / Self Care | Payer: Medicare Other | Attending: Dermatology | Admitting: Dermatology

## 2016-03-02 ENCOUNTER — Encounter (HOSPITAL_BASED_OUTPATIENT_CLINIC_OR_DEPARTMENT_OTHER): Payer: Self-pay | Admitting: Emergency Medicine

## 2016-03-02 ENCOUNTER — Emergency Department (HOSPITAL_BASED_OUTPATIENT_CLINIC_OR_DEPARTMENT_OTHER): Payer: Medicare Other

## 2016-03-02 DIAGNOSIS — Z87891 Personal history of nicotine dependence: Secondary | ICD-10-CM | POA: Insufficient documentation

## 2016-03-02 DIAGNOSIS — I2510 Atherosclerotic heart disease of native coronary artery without angina pectoris: Secondary | ICD-10-CM | POA: Diagnosis not present

## 2016-03-02 DIAGNOSIS — I129 Hypertensive chronic kidney disease with stage 1 through stage 4 chronic kidney disease, or unspecified chronic kidney disease: Secondary | ICD-10-CM | POA: Insufficient documentation

## 2016-03-02 DIAGNOSIS — N183 Chronic kidney disease, stage 3 (moderate): Secondary | ICD-10-CM | POA: Insufficient documentation

## 2016-03-02 DIAGNOSIS — R062 Wheezing: Secondary | ICD-10-CM

## 2016-03-02 DIAGNOSIS — E1122 Type 2 diabetes mellitus with diabetic chronic kidney disease: Secondary | ICD-10-CM | POA: Diagnosis not present

## 2016-03-02 DIAGNOSIS — J069 Acute upper respiratory infection, unspecified: Secondary | ICD-10-CM | POA: Diagnosis not present

## 2016-03-02 DIAGNOSIS — Z5321 Procedure and treatment not carried out due to patient leaving prior to being seen by health care provider: Secondary | ICD-10-CM | POA: Diagnosis not present

## 2016-03-02 DIAGNOSIS — R05 Cough: Secondary | ICD-10-CM | POA: Insufficient documentation

## 2016-03-02 DIAGNOSIS — Z7982 Long term (current) use of aspirin: Secondary | ICD-10-CM | POA: Diagnosis not present

## 2016-03-02 MED ORDER — ALBUTEROL SULFATE HFA 108 (90 BASE) MCG/ACT IN AERS: 1.0000 | INHALATION_SPRAY | Freq: Four times a day (QID) | RESPIRATORY_TRACT | 0 refills | Status: DC | PRN

## 2016-03-02 MED ORDER — AEROCHAMBER PLUS W/MASK MISC: 2 refills | Status: DC

## 2016-03-02 MED ORDER — AZITHROMYCIN 250 MG PO TABS: 250.0000 mg | ORAL_TABLET | Freq: Every day | ORAL | 0 refills | Status: DC

## 2016-03-02 MED ORDER — PREDNISONE 20 MG PO TABS: ORAL_TABLET | ORAL | 0 refills | Status: DC

## 2016-03-02 MED ORDER — IPRATROPIUM-ALBUTEROL 0.5-2.5 (3) MG/3ML IN SOLN
3.0000 mL | Freq: Once | RESPIRATORY_TRACT | Status: AC
  Administered 2016-03-02: 3 mL via RESPIRATORY_TRACT

## 2016-03-02 NOTE — ED Triage Notes (Signed)
Reports cough with some wheezing and general aches starting 2 days ago; no known fever.

## 2016-03-02 NOTE — ED Notes (Signed)
Patient is currently at [facility] urgent care. D/c'd from this facility

## 2016-03-02 NOTE — ED Provider Notes (Signed)
CSN: [id number]     Arrival date & time 03/02/16  1715 History   First MD Initiated Contact with Patient 03/02/16 1735     Chief Complaint  Patient presents with  . Cough  . Generalized Body Aches  . Wheezing   (Consider location/radiation/quality/duration/timing/severity/associated sxs/prior Treatment) HPI Stacy [name] is a 83 y.o. female presenting to UC with c/o gradually worsening dry cough that became productive today, wheeze, and chest soreness from cough.  Symptoms started 2 days ago. No known sick contacts.  Denies fever, chills, n/v/d. She did get the flu vaccine this year. Denies hx of asthma or COPD.  She has not needed to use an inhaler in the past.    Pt initially wen to [place] and did get a chest x-ray taken but was still in waiting room when she decided to come to [facility] due to the long wait.   Past Medical History:  Diagnosis Date  . Anxiety   . Arthritis    Cervical DDD C5-6 and C6-7  . Atheroembolism   . Blue toe syndrome ([place])   . CAD (coronary artery disease)   . Cancer ([place])    skin BCC  Forehead and  SCC Left Lower Leg  . Carotid artery occlusion   . Chronic kidney disease, stage III (moderate)   . DDD (degenerative disc disease)   . Depression   . DM (diabetes mellitus) ([place])   . Gallstones   . GERD (gastroesophageal reflux disease)   . Heart murmur   . Hemorrhoid   . Hemorrhoids   . Hiatal hernia   . History of shingles   . Hyperlipidemia   . Hypertension   . Leg pain   . Mild incontinence   . Multiple thyroid nodules    checked every year  . Myocardial infarction 03/15/95  . Osteopenia   . Peptic ulcer disease   . Thyroid nodule    Past Surgical History:  Procedure Laterality Date  . APPENDECTOMY    . Basal Cell Cancer Removal     forehead  . CATARACT EXTRACTION    . CORONARY ANGIOPLASTY  03-15-1995  . CORONARY ANGIOPLASTY WITH STENT PLACEMENT  05-19-1998  . CORONARY STENT PLACEMENT  2000  . DILATION AND  CURETTAGE OF UTERUS    . ENDARTERECTOMY Right 04/13/2015   Procedure: ENDARTERECTOMY CAROTID;  Surgeon: Serafina Mitchell, MD;  Location: [facility] OR;  Service: Vascular;  Laterality: Right;  . ENDOMETRIAL BIOPSY    . EYE SURGERY    . HEMORRHOID SURGERY    . INCISIONAL HERNIA REPAIR    . INGUINAL HERNIA REPAIR    . PATCH ANGIOPLASTY Right 04/13/2015   Procedure: PATCH ANGIOPLASTY;  Surgeon: Serafina Mitchell, MD;  Location: [place];  Service: Vascular;  Laterality: Right;  . PTCA    . SQUAMOUS CELL CARCINOMA EXCISION     left lower leg  . TONSILLECTOMY     Family History  Problem Relation Age of Onset  . Diabetes Father   . Prostate cancer Father     meta to colon  . Cancer Father     Prostate  . Parkinsonism Mother   . Stroke Paternal Uncle   . Hypertension Paternal Uncle   . Heart attack Neg Hx    Social History  Substance Use Topics  . Smoking status: Former Smoker    Packs/day: 1.00    Years: 54.00    Types: Cigarettes    Quit date: 10/05/1998  .  Smokeless tobacco: Never Used  . Alcohol use No   OB History    No data available     Review of Systems  Constitutional: Negative for chills and fever.  HENT: Positive for congestion and rhinorrhea. Negative for ear pain, sore throat, trouble swallowing and voice change.   Respiratory: Positive for cough and wheezing. Negative for shortness of breath.   Cardiovascular: Negative for chest pain and palpitations.  Gastrointestinal: Negative for abdominal pain, diarrhea, nausea and vomiting.  Musculoskeletal: Negative for arthralgias, back pain and myalgias.  Skin: Negative for rash.    Allergies  Iohexol; Sulfonamide derivatives; Latex; and Tranilast  Home Medications   Prior to Admission medications   Medication Sig Start Date End Date Taking? Authorizing Provider  ACCU-CHEK SMARTVIEW test strip 1 each by Other route as needed for other (blood sugar test strips).  03/29/14   Historical Provider, MD  acetaminophen (TYLENOL) 325 MG  tablet Take 325 mg by mouth every 6 (six) hours as needed for mild pain or moderate pain.    Historical Provider, MD  albuterol (PROVENTIL HFA;VENTOLIN HFA) 108 (90 Base) MCG/ACT inhaler Inhale 1-2 puffs into the lungs every 6 (six) hours as needed for wheezing or shortness of breath. 03/02/16   Noland Fordyce, PA-C  aspirin 81 MG tablet Take 1 tablet (81 mg total) by mouth daily. 11/04/12   Sherren Mocha, MD  azithromycin (ZITHROMAX) 250 MG tablet Take 1 tablet (250 mg total) by mouth daily. Take first 2 tablets together, then 1 every day until finished. 03/02/16   Noland Fordyce, PA-C  Calcium Carbonate Antacid (TUMS PO) Take by mouth.    Historical Provider, MD  Cetirizine HCl (ZYRTEC PO) Take by mouth.    Historical Provider, MD  cholecalciferol (VITAMIN D) 400 UNITS TABS Take 400 Units by mouth daily.     Historical Provider, MD  clopidogrel (PLAVIX) 75 MG tablet Take 1 tablet (75 mg total) by mouth daily. 10/30/15   Sherren Mocha, MD  Coenzyme Q10 (COQ-10 PO) Take 50 mg by mouth daily. 1 tab po qd    Historical Provider, MD  conjugated estrogens (PREMARIN) vaginal cream 0.5 g once a week. .625 mg once weekly    Historical Provider, MD  dexlansoprazole (DEXILANT) 60 MG capsule Take 60 mg by mouth daily as needed (for acid reflux). Take 1 tablet ( Dexilant DR ) by mouth once daily    Historical Provider, MD  diphenhydrAMINE (BENADRYL) 25 mg capsule Take 25 mg by mouth as needed for allergies.     Historical Provider, MD  diphenhydramine-acetaminophen (TYLENOL PM) 25-500 MG TABS tablet Take 1 tablet by mouth at bedtime as needed (for sleep).     Historical Provider, MD  hydrochlorothiazide (MICROZIDE) 12.5 MG capsule Take 1 capsule (12.5 mg total) by mouth daily. 10/30/15   Sherren Mocha, MD  hydrocortisone 2.5 % cream Apply 1 application topically daily as needed (for itching).  12/28/13   Historical Provider, MD  Melatonin 5 MG CAPS Take 1 capsule by mouth at bedtime as needed.     Historical  Provider, MD  metoprolol succinate (TOPROL-XL) 25 MG 24 hr tablet Take 1 tablet (25 mg total) by mouth daily. 10/30/15   Sherren Mocha, MD  Multiple Vitamins-Minerals (CENTRUM SILVER PO) Take 1 tablet by mouth daily. 1 tab po qd     Historical Provider, MD  NEOMYCIN-POLYMYXIN-HYDROCORTISONE (CORTISPORIN) 1 % SOLN otic solution Place 3 drops into both ears as needed (place two drops into each ear as needed for itching).  01/27/14   Historical Provider, MD  nitroGLYCERIN (NITROSTAT) 0.4 MG SL tablet Place 1 tablet (0.4 mg total) under the tongue every 5 (five) minutes as needed for chest pain. 03/28/15   Sherren Mocha, MD  Polyethylene Glycol 400 (BLINK TEARS OP) Place 1 drop into both eyes daily as needed (for). Reported on 05/05/2015    Historical Provider, MD  polyethylene glycol powder (GLYCOLAX/MIRALAX) powder Take 17 g by mouth daily. Reported on 06/12/2015    Historical Provider, MD  predniSONE (DELTASONE) 20 MG tablet 3 tabs po day one, then 2 po daily x 4 days 03/02/16   Noland Fordyce, PA-C  ramipril (ALTACE) 2.5 MG capsule TAKE 1 CAPSULE (2.5 MG TOTAL) BY MOUTH 2 (TWO) TIMES DAILY. 10/30/15   Sherren Mocha, MD  rosuvastatin (CRESTOR) 10 MG tablet Take 1 tablet (10 mg total) by mouth daily. 10/30/15   Sherren Mocha, MD  sertraline (ZOLOFT) 25 MG tablet Take 25 mg by mouth daily.  04/22/12   Historical Provider, MD  Spacer/Aero-Holding Chambers (AEROCHAMBER PLUS WITH MASK) inhaler Use as instructed 03/02/16   Noland Fordyce, PA-C  trolamine salicylate (ASPERCREME) 10 % cream Apply 1 application topically as needed for muscle pain.    Historical Provider, MD   Meds Ordered and Administered this Visit   Medications  ipratropium-albuterol (DUONEB) 0.5-2.5 (3) MG/3ML nebulizer solution 3 mL (3 mLs Nebulization Given 03/02/16 1747)    BP 142/77 (BP Location: Left Arm)   Pulse (!) 58   Temp 97.5 F (36.4 C) (Oral)   Resp 16   Ht 5\' 3"  (1.6 m)   Wt 140 lb (63.5 kg)   SpO2 96%   BMI 24.80 kg/m   No data found.   Physical Exam  Constitutional: She is oriented to person, place, and time. She appears well-developed and well-nourished. No distress.  HENT:  Head: Normocephalic and atraumatic.  Right Ear: Tympanic membrane normal.  Left Ear: Tympanic membrane normal.  Nose: Nose normal.  Mouth/Throat: Uvula is midline, oropharynx is clear and moist and mucous membranes are normal.  Eyes: EOM are normal.  Neck: Normal range of motion. Neck supple.  Cardiovascular: Normal rate and regular rhythm.   Pulmonary/Chest: Effort normal. No stridor. No respiratory distress. She has wheezes. She has no rales.  Musculoskeletal: Normal range of motion.  Lymphadenopathy:    She has no cervical adenopathy.  Neurological: She is alert and oriented to person, place, and time.  Skin: Skin is warm and dry. She is not diaphoretic.  Psychiatric: She has a normal mood and affect. Her behavior is normal.  Nursing note and vitals reviewed.   Urgent Care Course     Procedures (including critical care time)  Labs Review Labs Reviewed - No data to display  Imaging Review Dg Chest 2 View  Result Date: 03/02/2016 CLINICAL DATA:  Cough since Thursday. EXAM: CHEST  2 VIEW COMPARISON:  April 15, 2006 FINDINGS: The cardiomediastinal silhouette is normal. No pneumothorax. Minimal opacity in the left base favored represent atelectasis. No other abnormalities. IMPRESSION: Minimal opacity in left lung base is similar in the interval and favored to represent scar or atelectasis. No convincing evidence of pneumonia on today's study. Electronically Signed   By: Dorise Bullion III M.D   On: 03/02/2016 14:28    MDM   1. Upper respiratory tract infection, unspecified type   2. Wheeze    Pt c/o URI symptoms that started 2 days ago, associated wheeze.  CXR: c/w atelectasis w/o convincing evidence of pneumonia.  Wheeze noted on exam. O2 Sat 96% on RA  duoneb given in UC, pt notes she feels slight  improvement. Lung sounds did improve.  Rx: Prednisone, Albuterol, Azithromycin F/u with PCP in 1 week if not improving, sooner if worsening. Patient and caregiver verbalized understanding and agreement with treatment plan.     Noland Fordyce, PA-C 03/02/16 [phone]

## 2016-03-02 NOTE — ED Notes (Signed)
Triage informed by registration, pt has left the ED.

## 2016-03-02 NOTE — ED Triage Notes (Signed)
Patient reports that she has had a cough since Thursday. The patient has intermittent "wheezing" that she can hear when she is laying down. Patient denies any SOB at this time

## 2016-03-02 NOTE — Discharge Instructions (Signed)
°  It is recommended you take all your prescriptions as prescribed, get plenty of rest, and stay well hydrated.  If you are still feeling bad by Wednesday, it is recommended you follow up with your primary care provider or return to urgent care for recheck of symptoms. 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    If you develop chest pain or difficulty breathing, please call 911 or go to the closest emergency department for further evaluation and treatment.

## 2016-03-04 ENCOUNTER — Encounter (HOSPITAL_COMMUNITY): Payer: Self-pay

## 2016-03-05 ENCOUNTER — Encounter (HOSPITAL_COMMUNITY): Payer: Self-pay | Admitting: Dentistry

## 2016-03-06 ENCOUNTER — Encounter (HOSPITAL_COMMUNITY): Payer: Self-pay

## 2016-03-06 ENCOUNTER — Encounter (HOSPITAL_COMMUNITY): Payer: Self-pay | Admitting: Dentistry

## 2016-03-08 ENCOUNTER — Encounter (HOSPITAL_COMMUNITY): Payer: Self-pay

## 2016-03-11 ENCOUNTER — Encounter (HOSPITAL_COMMUNITY): Payer: Self-pay

## 2016-03-11 DIAGNOSIS — J180 Bronchopneumonia, unspecified organism: Secondary | ICD-10-CM | POA: Diagnosis not present

## 2016-03-13 ENCOUNTER — Encounter (HOSPITAL_COMMUNITY): Payer: Self-pay

## 2016-03-15 ENCOUNTER — Encounter (HOSPITAL_COMMUNITY): Payer: Medicare Other

## 2016-03-15 DIAGNOSIS — I2510 Atherosclerotic heart disease of native coronary artery without angina pectoris: Secondary | ICD-10-CM | POA: Insufficient documentation

## 2016-03-18 ENCOUNTER — Encounter (HOSPITAL_COMMUNITY): Admission: RE | Admit: 2016-03-18 | Payer: Medicare Other | Source: Ambulatory Visit

## 2016-03-20 ENCOUNTER — Encounter (HOSPITAL_COMMUNITY): Admission: RE | Admit: 2016-03-20 | Payer: Medicare Other | Source: Ambulatory Visit

## 2016-03-22 ENCOUNTER — Encounter (HOSPITAL_COMMUNITY): Payer: Medicare Other

## 2016-03-22 DIAGNOSIS — E119 Type 2 diabetes mellitus without complications: Secondary | ICD-10-CM | POA: Diagnosis not present

## 2016-03-22 DIAGNOSIS — Z5181 Encounter for therapeutic drug level monitoring: Secondary | ICD-10-CM | POA: Diagnosis not present

## 2016-03-22 DIAGNOSIS — I2510 Atherosclerotic heart disease of native coronary artery without angina pectoris: Secondary | ICD-10-CM | POA: Diagnosis not present

## 2016-03-22 DIAGNOSIS — E042 Nontoxic multinodular goiter: Secondary | ICD-10-CM | POA: Diagnosis not present

## 2016-03-22 DIAGNOSIS — M8580 Other specified disorders of bone density and structure, unspecified site: Secondary | ICD-10-CM | POA: Diagnosis not present

## 2016-03-25 ENCOUNTER — Encounter (HOSPITAL_COMMUNITY)
Admission: RE | Admit: 2016-03-25 | Discharge: 2016-03-25 | Disposition: A | Discharge status: Home / Self Care | Payer: Medicare Other | Source: Ambulatory Visit | Attending: Cardiovascular Disease | Admitting: Cardiovascular Disease

## 2016-03-27 ENCOUNTER — Encounter (HOSPITAL_COMMUNITY)
Admission: RE | Admit: 2016-03-27 | Discharge: 2016-03-27 | Disposition: A | Discharge status: Home / Self Care | Payer: Medicare Other | Source: Ambulatory Visit | Attending: Cardiovascular Disease | Admitting: Cardiovascular Disease

## 2016-03-29 ENCOUNTER — Encounter (HOSPITAL_COMMUNITY)
Admission: RE | Admit: 2016-03-29 | Discharge: 2016-03-29 | Disposition: A | Discharge status: Home / Self Care | Payer: Medicare Other | Source: Ambulatory Visit | Attending: Cardiovascular Disease | Admitting: Cardiovascular Disease

## 2016-04-01 ENCOUNTER — Encounter (HOSPITAL_COMMUNITY)
Admission: RE | Admit: 2016-04-01 | Discharge: 2016-04-01 | Disposition: A | Discharge status: Home / Self Care | Payer: Medicare Other | Source: Ambulatory Visit | Attending: Cardiovascular Disease | Admitting: Cardiovascular Disease

## 2016-04-03 ENCOUNTER — Encounter (HOSPITAL_COMMUNITY)
Admission: RE | Admit: 2016-04-03 | Discharge: 2016-04-03 | Disposition: A | Discharge status: Home / Self Care | Payer: Medicare Other | Source: Ambulatory Visit | Attending: Cardiovascular Disease | Admitting: Cardiovascular Disease

## 2016-04-05 ENCOUNTER — Encounter (HOSPITAL_COMMUNITY)
Admission: RE | Admit: 2016-04-05 | Discharge: 2016-04-05 | Disposition: A | Discharge status: Home / Self Care | Payer: Medicare Other | Source: Ambulatory Visit | Attending: Cardiovascular Disease | Admitting: Cardiovascular Disease

## 2016-04-08 ENCOUNTER — Encounter (HOSPITAL_COMMUNITY)
Admission: RE | Admit: 2016-04-08 | Discharge: 2016-04-08 | Disposition: A | Discharge status: Home / Self Care | Payer: Medicare Other | Source: Ambulatory Visit | Attending: Cardiovascular Disease | Admitting: Cardiovascular Disease

## 2016-04-09 DIAGNOSIS — I10 Essential (primary) hypertension: Secondary | ICD-10-CM | POA: Diagnosis not present

## 2016-04-09 DIAGNOSIS — E119 Type 2 diabetes mellitus without complications: Secondary | ICD-10-CM | POA: Diagnosis not present

## 2016-04-10 ENCOUNTER — Encounter (HOSPITAL_COMMUNITY)
Admission: RE | Admit: 2016-04-10 | Discharge: 2016-04-10 | Disposition: A | Discharge status: Home / Self Care | Payer: Medicare Other | Source: Ambulatory Visit | Attending: Cardiovascular Disease | Admitting: Cardiovascular Disease

## 2016-04-11 DIAGNOSIS — H906 Mixed conductive and sensorineural hearing loss, bilateral: Secondary | ICD-10-CM | POA: Diagnosis not present

## 2016-04-11 DIAGNOSIS — E119 Type 2 diabetes mellitus without complications: Secondary | ICD-10-CM | POA: Diagnosis not present

## 2016-04-11 DIAGNOSIS — F329 Major depressive disorder, single episode, unspecified: Secondary | ICD-10-CM | POA: Diagnosis not present

## 2016-04-11 DIAGNOSIS — I10 Essential (primary) hypertension: Secondary | ICD-10-CM | POA: Diagnosis not present

## 2016-04-11 DIAGNOSIS — E782 Mixed hyperlipidemia: Secondary | ICD-10-CM | POA: Diagnosis not present

## 2016-04-12 ENCOUNTER — Encounter (HOSPITAL_COMMUNITY): Payer: Medicare Other

## 2016-04-12 DIAGNOSIS — I2510 Atherosclerotic heart disease of native coronary artery without angina pectoris: Secondary | ICD-10-CM | POA: Insufficient documentation

## 2016-04-15 ENCOUNTER — Encounter (HOSPITAL_COMMUNITY)
Admission: RE | Admit: 2016-04-15 | Discharge: 2016-04-15 | Disposition: A | Discharge status: Home / Self Care | Payer: Medicare Other | Source: Ambulatory Visit | Attending: Cardiovascular Disease | Admitting: Cardiovascular Disease

## 2016-04-17 ENCOUNTER — Encounter (HOSPITAL_COMMUNITY)
Admission: RE | Admit: 2016-04-17 | Discharge: 2016-04-17 | Disposition: A | Discharge status: Home / Self Care | Payer: Medicare Other | Source: Ambulatory Visit | Attending: Cardiovascular Disease | Admitting: Cardiovascular Disease

## 2016-04-19 ENCOUNTER — Encounter (HOSPITAL_COMMUNITY): Payer: Medicare Other

## 2016-04-22 ENCOUNTER — Encounter (HOSPITAL_COMMUNITY): Payer: Medicare Other

## 2016-04-24 ENCOUNTER — Encounter (HOSPITAL_COMMUNITY)
Admission: RE | Admit: 2016-04-24 | Discharge: 2016-04-24 | Disposition: A | Discharge status: Home / Self Care | Payer: Medicare Other | Source: Ambulatory Visit | Attending: Cardiovascular Disease | Admitting: Cardiovascular Disease

## 2016-04-26 ENCOUNTER — Encounter (HOSPITAL_COMMUNITY)
Admission: RE | Admit: 2016-04-26 | Discharge: 2016-04-26 | Disposition: A | Discharge status: Home / Self Care | Payer: Medicare Other | Source: Ambulatory Visit | Attending: Cardiovascular Disease | Admitting: Cardiovascular Disease

## 2016-04-29 ENCOUNTER — Encounter (HOSPITAL_COMMUNITY)
Admission: RE | Admit: 2016-04-29 | Discharge: 2016-04-29 | Disposition: A | Discharge status: Home / Self Care | Payer: Medicare Other | Source: Ambulatory Visit | Attending: Cardiovascular Disease | Admitting: Cardiovascular Disease

## 2016-05-01 ENCOUNTER — Encounter (HOSPITAL_COMMUNITY): Payer: Medicare Other

## 2016-05-03 ENCOUNTER — Encounter (HOSPITAL_COMMUNITY)
Admission: RE | Admit: 2016-05-03 | Discharge: 2016-05-03 | Disposition: A | Discharge status: Home / Self Care | Payer: Medicare Other | Source: Ambulatory Visit | Attending: Cardiovascular Disease | Admitting: Cardiovascular Disease

## 2016-05-06 ENCOUNTER — Encounter (HOSPITAL_COMMUNITY)
Admission: RE | Admit: 2016-05-06 | Discharge: 2016-05-06 | Disposition: A | Discharge status: Home / Self Care | Payer: Medicare Other | Source: Ambulatory Visit | Attending: Cardiovascular Disease | Admitting: Cardiovascular Disease

## 2016-05-08 ENCOUNTER — Encounter: Payer: Self-pay | Admitting: Cardiovascular Disease

## 2016-05-08 ENCOUNTER — Encounter (HOSPITAL_COMMUNITY)
Admission: RE | Admit: 2016-05-08 | Discharge: 2016-05-08 | Disposition: A | Discharge status: Home / Self Care | Payer: Medicare Other | Source: Ambulatory Visit | Attending: Cardiovascular Disease | Admitting: Cardiovascular Disease

## 2016-05-10 ENCOUNTER — Encounter (HOSPITAL_COMMUNITY)
Admission: RE | Admit: 2016-05-10 | Discharge: 2016-05-10 | Disposition: A | Discharge status: Home / Self Care | Payer: Medicare Other | Source: Ambulatory Visit | Attending: Cardiovascular Disease | Admitting: Cardiovascular Disease

## 2016-05-13 ENCOUNTER — Encounter (HOSPITAL_COMMUNITY): Payer: Medicare Other

## 2016-05-13 DIAGNOSIS — I2510 Atherosclerotic heart disease of native coronary artery without angina pectoris: Secondary | ICD-10-CM | POA: Insufficient documentation

## 2016-05-15 ENCOUNTER — Encounter (HOSPITAL_COMMUNITY)
Admission: RE | Admit: 2016-05-15 | Discharge: 2016-05-15 | Disposition: A | Discharge status: Home / Self Care | Payer: Medicare Other | Source: Ambulatory Visit | Attending: Cardiovascular Disease | Admitting: Cardiovascular Disease

## 2016-05-17 ENCOUNTER — Encounter (HOSPITAL_COMMUNITY)
Admission: RE | Admit: 2016-05-17 | Discharge: 2016-05-17 | Disposition: A | Discharge status: Home / Self Care | Payer: Medicare Other | Source: Ambulatory Visit | Attending: Cardiovascular Disease | Admitting: Cardiovascular Disease

## 2016-05-20 ENCOUNTER — Encounter (HOSPITAL_COMMUNITY)
Admission: RE | Admit: 2016-05-20 | Discharge: 2016-05-20 | Disposition: A | Discharge status: Home / Self Care | Payer: Medicare Other | Source: Ambulatory Visit | Attending: Cardiovascular Disease | Admitting: Cardiovascular Disease

## 2016-05-22 ENCOUNTER — Encounter (HOSPITAL_COMMUNITY): Payer: Medicare Other

## 2016-05-22 ENCOUNTER — Encounter: Payer: Self-pay | Admitting: Cardiovascular Disease

## 2016-05-22 ENCOUNTER — Ambulatory Visit (INDEPENDENT_AMBULATORY_CARE_PROVIDER_SITE_OTHER): Payer: Medicare Other | Admitting: Cardiovascular Disease

## 2016-05-22 VITALS — BP 122/60 | HR 60 | Ht 63.0 in | Wt 150.4 lb

## 2016-05-22 DIAGNOSIS — I2510 Atherosclerotic heart disease of native coronary artery without angina pectoris: Secondary | ICD-10-CM | POA: Diagnosis not present

## 2016-05-22 DIAGNOSIS — E119 Type 2 diabetes mellitus without complications: Secondary | ICD-10-CM | POA: Diagnosis not present

## 2016-05-22 DIAGNOSIS — E785 Hyperlipidemia, unspecified: Secondary | ICD-10-CM

## 2016-05-22 NOTE — Progress Notes (Signed)
Cardiology Office Note Date:  05/24/2016   ID:  ANELLE PARLOW, DOB 16-Jun-1926, MRN [id number]  PCP:  Lillard Anes, MD  Cardiologist:  Sherren Mocha, MD    Chief Complaint  Patient presents with  . Coronary Artery Disease     History of Present Illness: Stacy [name] is a 83 y.o. female who presents for follow-up evaluation. The patient is followed for coronary artery disease.  She initially presented in 1997 with an anterior MI treated with balloon angioplasty of the LAD. She subsequently underwent stenting of the right coronary artery in 2000. Because of recurrent symptoms, she underwent repeat heart catheterization in 2008 and this demonstrated patency of her previous angioplasty and stent sites.  The patient also has carotid artery stenosis and she underwent right carotid endarterectomy in 2017 by Dr. Trula Slade. Her surgery was uncomplicated. She presents today for scheduled follow-up evaluation. She continues to participating regularly in the maintenance phase of cardiac rehabilitation.  Here with her daughter today. Doing well. Today, she denies symptoms of palpitations, chest pain, shortness of breath, orthopnea, PND, lower extremity edema, dizziness, or syncope.  Notes glycemic control hasn't been as good as in past. Was intolerant to higher dose of metformin.   Past Medical History:  Diagnosis Date  . Anxiety   . Arthritis    Cervical DDD C5-6 and C6-7  . Atheroembolism   . Blue toe syndrome ([place])   . CAD (coronary artery disease)   . Cancer ([place])    skin BCC  Forehead and  SCC Left Lower Leg  . Carotid artery occlusion   . Chronic kidney disease, stage III (moderate)   . DDD (degenerative disc disease)   . Depression   . DM (diabetes mellitus) ([place])   . Gallstones   . GERD (gastroesophageal reflux disease)   . Heart murmur   . Hemorrhoid   . Hemorrhoids   . Hiatal hernia   . History of shingles   . Hyperlipidemia   . Hypertension   . Leg pain   . Mild  incontinence   . Multiple thyroid nodules    checked every year  . Myocardial infarction 03/15/95  . Osteopenia   . Peptic ulcer disease   . Thyroid nodule     Past Surgical History:  Procedure Laterality Date  . APPENDECTOMY    . Basal Cell Cancer Removal     forehead  . CATARACT EXTRACTION    . CORONARY ANGIOPLASTY  03-15-1995  . CORONARY ANGIOPLASTY WITH STENT PLACEMENT  05-19-1998  . CORONARY STENT PLACEMENT  2000  . DILATION AND CURETTAGE OF UTERUS    . ENDARTERECTOMY Right 04/13/2015   Procedure: ENDARTERECTOMY CAROTID;  Surgeon: Serafina Mitchell, MD;  Location: [facility] OR;  Service: Vascular;  Laterality: Right;  . ENDOMETRIAL BIOPSY    . EYE SURGERY    . HEMORRHOID SURGERY    . INCISIONAL HERNIA REPAIR    . INGUINAL HERNIA REPAIR    . PATCH ANGIOPLASTY Right 04/13/2015   Procedure: PATCH ANGIOPLASTY;  Surgeon: Serafina Mitchell, MD;  Location: [facility];  Service: Vascular;  Laterality: Right;  . PTCA    . SQUAMOUS CELL CARCINOMA EXCISION     left lower leg  . TONSILLECTOMY      Current Outpatient Prescriptions  Medication Sig Dispense Refill  . ACCU-CHEK SMARTVIEW test strip 1 each by Other route as needed for other (blood sugar test strips).   11  . acetaminophen (TYLENOL) 325 MG tablet Take 325 mg  by mouth every 6 (six) hours as needed for mild pain or moderate pain.    Marland Kitchen albuterol (PROVENTIL HFA;VENTOLIN HFA) 108 (90 Base) MCG/ACT inhaler Inhale 1-2 puffs into the lungs every 6 (six) hours as needed for wheezing or shortness of breath. 1 Inhaler 0  . aspirin 81 MG tablet Take 1 tablet (81 mg total) by mouth daily. 1 tablet   . benzonatate (TESSALON) 100 MG capsule Take 100 mg by mouth 3 (three) times daily as needed for cough.  2  . Calcium Carbonate Antacid (TUMS PO) Take 1 Dose by mouth as directed.     . cholecalciferol (VITAMIN D) 400 UNITS TABS Take 400 Units by mouth daily.     . clopidogrel (PLAVIX) 75 MG tablet Take 1 tablet (75 mg total) by mouth daily. 90 tablet 3  .  Coenzyme Q10 (COQ-10 PO) Take 50 mg by mouth daily. 1 tab po qd    . conjugated estrogens (PREMARIN) vaginal cream Place 1 g vaginally once a week. .625 mg once weekly    . dexlansoprazole (DEXILANT) 60 MG capsule Take 60 mg by mouth daily as needed (for acid reflux). Take 1 tablet ( Dexilant DR ) by mouth once daily    . diphenhydrAMINE (BENADRYL) 25 mg capsule Take 25 mg by mouth as needed for allergies.     Marland Kitchen gabapentin (NEURONTIN) 100 MG capsule Take 100 mg by mouth 3 (three) times daily.    . hydrochlorothiazide (MICROZIDE) 12.5 MG capsule Take 1 capsule (12.5 mg total) by mouth daily. 90 capsule 3  . hydrocortisone 2.5 % cream Apply 1 application topically daily as needed (for itching).   1  . Melatonin 5 MG CAPS Take 1 capsule by mouth at bedtime as needed (sleep).     . metoprolol succinate (TOPROL-XL) 25 MG 24 hr tablet Take 1 tablet (25 mg total) by mouth daily. 90 tablet 3  . Multiple Vitamins-Minerals (CENTRUM SILVER PO) Take 1 tablet by mouth daily. 1 tab po qd     . NEOMYCIN-POLYMYXIN-HYDROCORTISONE (CORTISPORIN) 1 % SOLN otic solution Place 2 drops into both ears as directed.   3  . nitroGLYCERIN (NITROSTAT) 0.4 MG SL tablet Place 1 tablet (0.4 mg total) under the tongue every 5 (five) minutes as needed for chest pain. 25 tablet 2  . Polyethylene Glycol 400 (BLINK TEARS OP) Place 1 drop into both eyes daily as needed (for dry eye). Reported on 05/05/2015    . polyethylene glycol powder (GLYCOLAX/MIRALAX) powder Take 17 g by mouth daily. Reported on 06/12/2015    . ramipril (ALTACE) 2.5 MG capsule TAKE 1 CAPSULE (2.5 MG TOTAL) BY MOUTH 2 (TWO) TIMES DAILY. 180 capsule 3  . rosuvastatin (CRESTOR) 10 MG tablet Take 1 tablet (10 mg total) by mouth daily. 90 tablet 3  . sertraline (ZOLOFT) 25 MG tablet Take 25 mg by mouth daily.     Marland Kitchen Spacer/Aero-Holding Chambers (AEROCHAMBER PLUS WITH MASK) inhaler Use as instructed 1 each 2  . trolamine salicylate (ASPERCREME) 10 % cream Apply 1  application topically as needed for muscle pain.     No current facility-administered medications for this visit.     Allergies:   Iohexol; Sulfonamide derivatives; Latex; and Tranilast   Social History:  The patient  reports that she quit smoking about 17 years ago. Her smoking use included Cigarettes. She has a 54.00 pack-year smoking history. She has never used smokeless tobacco. She reports that she does not drink alcohol or use drugs.  Family History:  The patient's  family history includes Cancer in her father; Diabetes in her father; Hypertension in her paternal uncle; Parkinsonism in her mother; Prostate cancer in her father; Stroke in her paternal uncle.    ROS:  Please see the history of present illness.  All other systems are reviewed and negative.    PHYSICAL EXAM: VS:  BP 122/60   Pulse 60   Ht 5\' 3"  (1.6 m)   Wt 150 lb 6.4 oz (68.2 kg)   BMI 26.64 kg/m  , BMI Body mass index is 26.64 kg/m. GEN: Well nourished, well developed, pleasant elderly woman in no acute distress  HEENT: normal  Neck: no JVD, no masses. No carotid bruits Cardiac: RRR without murmur or gallop                Respiratory:  clear to auscultation bilaterally, normal work of breathing GI: soft, nontender, nondistended, + BS MS: no deformity or atrophy  Ext: no pretibial edema, pedal pulses 2+= bilaterally Skin: warm and dry, no rash Neuro:  Strength and sensation are intact Psych: euthymic mood, full affect  EKG:  EKG is ordered today. The ekg ordered today shows NSR with RBBB, age indeterminate inferior infarct, possible age-indeterminate anterior infarct. No change from previous  Recent Labs: No results found for requested labs within last 8760 hours.   Lipid Panel     Component Value Date/Time   CHOL 136 01/16/2012 0958   TRIG 154.0 (H) 01/16/2012 0958   HDL 40.60 01/16/2012 0958   CHOLHDL 3 01/16/2012 0958   VLDL 30.8 01/16/2012 0958   LDLCALC 65 01/16/2012 0958      Wt Readings  from Last 3 Encounters:  05/22/16 150 lb 6.4 oz (68.2 kg)  03/02/16 140 lb (63.5 kg)  03/02/16 140 lb (63.5 kg)    ASSESSMENT AND PLAN: 1.  Coronary artery disease, native vessel, without symptoms of angina: medical program reviewed and will be continued. On long-term DAPT.   2. Hyperlipidemia: Treated with Crestor 10 mg daily. Labs 04/09/16 reviewed with chol 130, LDL 46, HDL 48, trig 180. Discussed mild elevation triglycerides likely related to glycemic control/diabetes. Lifestyle modification discussed.   3. Essential hypertension: BP well controlled on current Rx.   4. Carotid artery disease: s/p CEA. Followed by vascular surgery. On appropriate medical Rx.   5. Type 2 diabetes: Note of Dr. Buddy Duty reviewed reviewed. Hemoglobin A1c is 7.7 mg/dL. Apparently has tolerated low dose metformin per daughter. Might be reasonable to try this again - advised them to touch base with Dr Buddy Duty.  Current medicines are reviewed with the patient today.  The patient does not have concerns regarding medicines.  Labs/ tests ordered today include:   Orders Placed This Encounter  Procedures  . EKG 12-Lead    Disposition:   FU 6 months  Signed, Sherren Mocha, MD  05/24/2016 6:10 AM    [facility] [place], [place], [place]  [address] Phone: [phone]; Fax: [phone]

## 2016-05-22 NOTE — Patient Instructions (Signed)

## 2016-05-24 ENCOUNTER — Encounter (HOSPITAL_COMMUNITY): Payer: Medicare Other

## 2016-05-27 ENCOUNTER — Encounter (HOSPITAL_COMMUNITY): Payer: Medicare Other

## 2016-05-29 ENCOUNTER — Encounter (HOSPITAL_COMMUNITY)
Admission: RE | Admit: 2016-05-29 | Discharge: 2016-05-29 | Disposition: A | Discharge status: Home / Self Care | Payer: Medicare Other | Source: Ambulatory Visit | Attending: Cardiovascular Disease | Admitting: Cardiovascular Disease

## 2016-05-31 ENCOUNTER — Encounter (HOSPITAL_COMMUNITY): Payer: Medicare Other

## 2016-06-03 ENCOUNTER — Encounter (HOSPITAL_COMMUNITY)
Admission: RE | Admit: 2016-06-03 | Discharge: 2016-06-03 | Disposition: A | Discharge status: Home / Self Care | Payer: Medicare Other | Source: Ambulatory Visit | Attending: Cardiovascular Disease | Admitting: Cardiovascular Disease

## 2016-06-05 ENCOUNTER — Encounter (HOSPITAL_COMMUNITY)
Admission: RE | Admit: 2016-06-05 | Discharge: 2016-06-05 | Disposition: A | Discharge status: Home / Self Care | Payer: Medicare Other | Source: Ambulatory Visit | Attending: Cardiovascular Disease | Admitting: Cardiovascular Disease

## 2016-06-07 ENCOUNTER — Encounter (HOSPITAL_COMMUNITY)
Admission: RE | Admit: 2016-06-07 | Discharge: 2016-06-07 | Disposition: A | Discharge status: Home / Self Care | Payer: Medicare Other | Source: Ambulatory Visit | Attending: Cardiovascular Disease | Admitting: Cardiovascular Disease

## 2016-06-10 ENCOUNTER — Encounter (HOSPITAL_COMMUNITY)
Admission: RE | Admit: 2016-06-10 | Discharge: 2016-06-10 | Disposition: A | Discharge status: Home / Self Care | Payer: Medicare Other | Source: Ambulatory Visit | Attending: Cardiovascular Disease | Admitting: Cardiovascular Disease

## 2016-06-11 DIAGNOSIS — H35373 Puckering of macula, bilateral: Secondary | ICD-10-CM | POA: Diagnosis not present

## 2016-06-11 DIAGNOSIS — E119 Type 2 diabetes mellitus without complications: Secondary | ICD-10-CM | POA: Diagnosis not present

## 2016-06-11 DIAGNOSIS — H524 Presbyopia: Secondary | ICD-10-CM | POA: Diagnosis not present

## 2016-06-11 DIAGNOSIS — H26492 Other secondary cataract, left eye: Secondary | ICD-10-CM | POA: Diagnosis not present

## 2016-06-11 DIAGNOSIS — H353132 Nonexudative age-related macular degeneration, bilateral, intermediate dry stage: Secondary | ICD-10-CM | POA: Diagnosis not present

## 2016-06-12 ENCOUNTER — Encounter (HOSPITAL_COMMUNITY)
Admission: RE | Admit: 2016-06-12 | Discharge: 2016-06-12 | Disposition: A | Discharge status: Home / Self Care | Payer: Self-pay | Source: Ambulatory Visit | Attending: Cardiovascular Disease | Admitting: Cardiovascular Disease

## 2016-06-12 DIAGNOSIS — I2510 Atherosclerotic heart disease of native coronary artery without angina pectoris: Secondary | ICD-10-CM | POA: Insufficient documentation

## 2016-06-14 ENCOUNTER — Encounter (HOSPITAL_COMMUNITY): Payer: Self-pay

## 2016-06-17 ENCOUNTER — Encounter (HOSPITAL_COMMUNITY): Payer: Self-pay

## 2016-06-19 ENCOUNTER — Encounter (HOSPITAL_COMMUNITY): Payer: Self-pay

## 2016-06-21 ENCOUNTER — Encounter (HOSPITAL_COMMUNITY): Payer: Self-pay

## 2016-06-22 ENCOUNTER — Encounter (HOSPITAL_COMMUNITY): Payer: Self-pay | Admitting: Emergency Medicine

## 2016-06-22 ENCOUNTER — Inpatient Hospital Stay (HOSPITAL_COMMUNITY)
Admission: EM | Admit: 2016-06-22 | Discharge: 2016-06-24 | DRG: 042 | Disposition: A | Discharge status: Home / Self Care | Payer: Medicare Other | Attending: Internal Medicine | Admitting: Internal Medicine

## 2016-06-22 DIAGNOSIS — Z8619 Personal history of other infectious and parasitic diseases: Secondary | ICD-10-CM

## 2016-06-22 DIAGNOSIS — Z6825 Body mass index (BMI) 25.0-25.9, adult: Secondary | ICD-10-CM

## 2016-06-22 DIAGNOSIS — Z823 Family history of stroke: Secondary | ICD-10-CM

## 2016-06-22 DIAGNOSIS — I63421 Cerebral infarction due to embolism of right anterior cerebral artery: Principal | ICD-10-CM | POA: Diagnosis present

## 2016-06-22 DIAGNOSIS — Z85828 Personal history of other malignant neoplasm of skin: Secondary | ICD-10-CM

## 2016-06-22 DIAGNOSIS — Z8711 Personal history of peptic ulcer disease: Secondary | ICD-10-CM

## 2016-06-22 DIAGNOSIS — Z955 Presence of coronary angioplasty implant and graft: Secondary | ICD-10-CM | POA: Diagnosis not present

## 2016-06-22 DIAGNOSIS — Z91041 Radiographic dye allergy status: Secondary | ICD-10-CM

## 2016-06-22 DIAGNOSIS — T148XXA Other injury of unspecified body region, initial encounter: Secondary | ICD-10-CM | POA: Diagnosis not present

## 2016-06-22 DIAGNOSIS — I129 Hypertensive chronic kidney disease with stage 1 through stage 4 chronic kidney disease, or unspecified chronic kidney disease: Secondary | ICD-10-CM | POA: Diagnosis present

## 2016-06-22 DIAGNOSIS — E1151 Type 2 diabetes mellitus with diabetic peripheral angiopathy without gangrene: Secondary | ICD-10-CM | POA: Diagnosis present

## 2016-06-22 DIAGNOSIS — Z87891 Personal history of nicotine dependence: Secondary | ICD-10-CM

## 2016-06-22 DIAGNOSIS — Z833 Family history of diabetes mellitus: Secondary | ICD-10-CM

## 2016-06-22 DIAGNOSIS — I2510 Atherosclerotic heart disease of native coronary artery without angina pectoris: Secondary | ICD-10-CM | POA: Diagnosis present

## 2016-06-22 DIAGNOSIS — G8314 Monoplegia of lower limb affecting left nondominant side: Secondary | ICD-10-CM | POA: Diagnosis present

## 2016-06-22 DIAGNOSIS — R29700 NIHSS score 0: Secondary | ICD-10-CM | POA: Diagnosis not present

## 2016-06-22 DIAGNOSIS — E663 Overweight: Secondary | ICD-10-CM | POA: Diagnosis present

## 2016-06-22 DIAGNOSIS — I6529 Occlusion and stenosis of unspecified carotid artery: Secondary | ICD-10-CM | POA: Diagnosis present

## 2016-06-22 DIAGNOSIS — M79605 Pain in left leg: Secondary | ICD-10-CM | POA: Diagnosis not present

## 2016-06-22 DIAGNOSIS — I252 Old myocardial infarction: Secondary | ICD-10-CM | POA: Diagnosis not present

## 2016-06-22 DIAGNOSIS — E1122 Type 2 diabetes mellitus with diabetic chronic kidney disease: Secondary | ICD-10-CM | POA: Diagnosis present

## 2016-06-22 DIAGNOSIS — I639 Cerebral infarction, unspecified: Secondary | ICD-10-CM | POA: Diagnosis not present

## 2016-06-22 DIAGNOSIS — Q283 Other malformations of cerebral vessels: Secondary | ICD-10-CM | POA: Diagnosis not present

## 2016-06-22 DIAGNOSIS — Z79890 Hormone replacement therapy (postmenopausal): Secondary | ICD-10-CM

## 2016-06-22 DIAGNOSIS — Z888 Allergy status to other drugs, medicaments and biological substances status: Secondary | ICD-10-CM

## 2016-06-22 DIAGNOSIS — I10 Essential (primary) hypertension: Secondary | ICD-10-CM | POA: Diagnosis present

## 2016-06-22 DIAGNOSIS — E785 Hyperlipidemia, unspecified: Secondary | ICD-10-CM | POA: Diagnosis present

## 2016-06-22 DIAGNOSIS — Z91040 Latex allergy status: Secondary | ICD-10-CM

## 2016-06-22 DIAGNOSIS — G252 Other specified forms of tremor: Secondary | ICD-10-CM | POA: Diagnosis present

## 2016-06-22 DIAGNOSIS — Z8042 Family history of malignant neoplasm of prostate: Secondary | ICD-10-CM

## 2016-06-22 DIAGNOSIS — I071 Rheumatic tricuspid insufficiency: Secondary | ICD-10-CM | POA: Diagnosis present

## 2016-06-22 DIAGNOSIS — K219 Gastro-esophageal reflux disease without esophagitis: Secondary | ICD-10-CM | POA: Diagnosis present

## 2016-06-22 DIAGNOSIS — N182 Chronic kidney disease, stage 2 (mild): Secondary | ICD-10-CM | POA: Diagnosis present

## 2016-06-22 DIAGNOSIS — Z7902 Long term (current) use of antithrombotics/antiplatelets: Secondary | ICD-10-CM

## 2016-06-22 DIAGNOSIS — I4510 Unspecified right bundle-branch block: Secondary | ICD-10-CM | POA: Diagnosis present

## 2016-06-22 DIAGNOSIS — Z8249 Family history of ischemic heart disease and other diseases of the circulatory system: Secondary | ICD-10-CM

## 2016-06-22 DIAGNOSIS — Z882 Allergy status to sulfonamides status: Secondary | ICD-10-CM

## 2016-06-22 DIAGNOSIS — Z7982 Long term (current) use of aspirin: Secondary | ICD-10-CM

## 2016-06-22 LAB — CBC WITH DIFFERENTIAL/PLATELET
BASOS ABS: 0 10*3/uL (ref 0.0–0.1)
Basophils Relative: 0 %
EOS ABS: 0.3 10*3/uL (ref 0.0–0.7)
Eosinophils Relative: 3 %
HEMATOCRIT: 41.3 % (ref 36.0–46.0)
HEMOGLOBIN: 13.4 g/dL (ref 12.0–15.0)
Lymphocytes Relative: 36 %
Lymphs Abs: 3.2 10*3/uL (ref 0.7–4.0)
MCH: 29.1 pg (ref 26.0–34.0)
MCHC: 32.4 g/dL (ref 30.0–36.0)
MCV: 89.6 fL (ref 78.0–100.0)
MONO ABS: 1 10*3/uL (ref 0.1–1.0)
MONOS PCT: 11 %
NEUTROS ABS: 4.3 10*3/uL (ref 1.7–7.7)
NEUTROS PCT: 50 %
Platelets: 179 10*3/uL (ref 150–400)
RBC: 4.61 MIL/uL (ref 3.87–5.11)
RDW: 14.2 % (ref 11.5–15.5)
WBC: 8.8 10*3/uL (ref 4.0–10.5)

## 2016-06-22 NOTE — ED Provider Notes (Signed)
[facility] DEPT Provider Note   CSN: [id number] Arrival date & time: 06/22/16  2310  By signing my name below, I, Levester Fresh, attest that this documentation has been prepared under the direction and in the presence of [place], Gwenyth Allegra Electronically Signed: Levester Fresh, Scribe. 06/23/2016. 3:13 AM.  History   Chief Complaint Chief Complaint  Patient presents with  . Fall   Stacy [name] is a 83 y.o. female with a PMHx significant for sciatica with right sided BLE radiation, who presents to the Emergency Department s/p a fall PTA. No LOC or head trauma. Pt reports that her left knee felt like it "gave out" while she was standing, and she was unable to stand back up. Pt with no unilateral numbness or weakness and is able to ambulate at her baseline in the room. She has had intermittent left knee weakness with no notable triggers. Presently, she reports normal sensation in her left knee.  Per EMS, pt crawled to the door and was able to bear weight with assistance.   Here, pt has no complaints and denies experiencing any other acute sx, including weakness, numbness, nausea, vomiting, diarrhea, fever, back pain, muscle pain or headache.  The history is provided by the patient, the EMS personnel and a relative.    Past Medical History:  Diagnosis Date  . Anxiety   . Arthritis    Cervical DDD C5-6 and C6-7  . Atheroembolism   . Blue toe syndrome ([place])   . CAD (coronary artery disease)   . Cancer ([place])    skin BCC  Forehead and  SCC Left Lower Leg  . Carotid artery occlusion   . Chronic kidney disease, stage III (moderate)   . DDD (degenerative disc disease)   . Depression   . DM (diabetes mellitus) ([place])   . Gallstones   . GERD (gastroesophageal reflux disease)   . Heart murmur   . Hemorrhoid   . Hemorrhoids   . Hiatal hernia   . History of shingles   . Hyperlipidemia   . Hypertension   . Leg pain   . Mild incontinence   . Multiple thyroid nodules    checked every year  . Myocardial infarction ([place]) 03/15/95  . Osteopenia   . Peptic ulcer disease   . Thyroid nodule     Patient Active Problem List   Diagnosis Date Noted  . Carotid artery stenosis 04/13/2015  . Insomnia 04/25/2012  . Pain in limb 12/09/2011  . HYPERLIPIDEMIA 06/18/2008  . RENAL ARTERY STENOSIS 06/18/2008  . GASTROESOPHAGEAL REFLUX DISEASE 06/18/2008  . PEPTIC ULCER DISEASE, HX OF 06/18/2008  . CATARACT EXTRACTIONS, BILATERAL, HX OF 06/18/2008  . Other acquired absence of organ 06/18/2008  . PERCUTANEOUS TRANSLUMINAL CORONARY ANGIOPLASTY, HX OF 06/18/2008  . Other postprocedural status(V45.89) 06/18/2008  . DM 05/16/2007  . HYPERTENSION 05/16/2007  . MYOCARDIAL INFARCTION, HX OF 05/16/2007  . Coronary atherosclerosis 05/16/2007  . CAROTID ARTERY DISEASE 05/16/2007  . ARTHRITIS 05/16/2007  . OSTEOPENIA 05/16/2007  . HEMORRHOIDS 11/18/2006    Past Surgical History:  Procedure Laterality Date  . APPENDECTOMY    . Basal Cell Cancer Removal     forehead  . CATARACT EXTRACTION    . CORONARY ANGIOPLASTY  03-15-1995  . CORONARY ANGIOPLASTY WITH STENT PLACEMENT  05-19-1998  . CORONARY STENT PLACEMENT  2000  . DILATION AND CURETTAGE OF UTERUS    . ENDARTERECTOMY Right 04/13/2015   Procedure: ENDARTERECTOMY CAROTID;  Surgeon: Serafina Mitchell, MD;  Location: [facility] OR;  Service: Vascular;  Laterality: Right;  . ENDOMETRIAL BIOPSY    . EYE SURGERY    . HEMORRHOID SURGERY    . INCISIONAL HERNIA REPAIR    . INGUINAL HERNIA REPAIR    . PATCH ANGIOPLASTY Right 04/13/2015   Procedure: PATCH ANGIOPLASTY;  Surgeon: Serafina Mitchell, MD;  Location: [facility];  Service: Vascular;  Laterality: Right;  . PTCA    . SQUAMOUS CELL CARCINOMA EXCISION     left lower leg  . TONSILLECTOMY      OB History    No data available       Home Medications    Prior to Admission medications   Medication Sig Start Date End Date Taking? Authorizing Provider  ACCU-CHEK SMARTVIEW test strip 1  each by Other route as needed for other (blood sugar test strips).  03/29/14   [provider]  acetaminophen (TYLENOL) 325 MG tablet Take 325 mg by mouth every 6 (six) hours as needed for mild pain or moderate pain.    [provider]  albuterol (PROVENTIL HFA;VENTOLIN HFA) 108 (90 Base) MCG/ACT inhaler Inhale 1-2 puffs into the lungs every 6 (six) hours as needed for wheezing or shortness of breath. 03/02/16   Noland Fordyce, PA-C  aspirin 81 MG tablet Take 1 tablet (81 mg total) by mouth daily. 11/04/12   Sherren Mocha, MD  benzonatate (TESSALON) 100 MG capsule Take 100 mg by mouth 3 (three) times daily as needed for cough. 03/11/16   [provider]  Calcium Carbonate Antacid (TUMS PO) Take 1 Dose by mouth as directed.     [provider]  cholecalciferol (VITAMIN D) 400 UNITS TABS Take 400 Units by mouth daily.     [provider]  clopidogrel (PLAVIX) 75 MG tablet Take 1 tablet (75 mg total) by mouth daily. 10/30/15   Sherren Mocha, MD  Coenzyme Q10 (COQ-10 PO) Take 50 mg by mouth daily. 1 tab po qd    [provider]  conjugated estrogens (PREMARIN) vaginal cream Place 1 g vaginally once a week. .625 mg once weekly    [provider]  dexlansoprazole (DEXILANT) 60 MG capsule Take 60 mg by mouth daily as needed (for acid reflux). Take 1 tablet ( Dexilant DR ) by mouth once daily    [provider]  diphenhydrAMINE (BENADRYL) 25 mg capsule Take 25 mg by mouth as needed for allergies.     [provider]  gabapentin (NEURONTIN) 100 MG capsule Take 100 mg by mouth 3 (three) times daily. 05/08/16   [provider]  hydrochlorothiazide (MICROZIDE) 12.5 MG capsule Take 1 capsule (12.5 mg total) by mouth daily. 10/30/15   Sherren Mocha, MD  hydrocortisone 2.5 % cream Apply 1 application topically daily as needed (for itching).  12/28/13   [provider]  Melatonin 5 MG CAPS Take 1 capsule by mouth at  bedtime as needed (sleep).     [provider]  metoprolol succinate (TOPROL-XL) 25 MG 24 hr tablet Take 1 tablet (25 mg total) by mouth daily. 10/30/15   Sherren Mocha, MD  Multiple Vitamins-Minerals (CENTRUM SILVER PO) Take 1 tablet by mouth daily. 1 tab po qd     [provider]  NEOMYCIN-POLYMYXIN-HYDROCORTISONE (CORTISPORIN) 1 % SOLN otic solution Place 2 drops into both ears as directed.  01/27/14   [provider]  nitroGLYCERIN (NITROSTAT) 0.4 MG SL tablet Place 1 tablet (0.4 mg total) under the tongue every 5 (five) minutes as  needed for chest pain. 03/28/15   Sherren Mocha, MD  Polyethylene Glycol 400 (BLINK TEARS OP) Place 1 drop into both eyes daily as needed (for dry eye). Reported on 05/05/2015    [provider]  polyethylene glycol powder (GLYCOLAX/MIRALAX) powder Take 17 g by mouth daily. Reported on 06/12/2015    [provider]  ramipril (ALTACE) 2.5 MG capsule TAKE 1 CAPSULE (2.5 MG TOTAL) BY MOUTH 2 (TWO) TIMES DAILY. 10/30/15   Sherren Mocha, MD  rosuvastatin (CRESTOR) 10 MG tablet Take 1 tablet (10 mg total) by mouth daily. 10/30/15   Sherren Mocha, MD  sertraline (ZOLOFT) 25 MG tablet Take 25 mg by mouth daily.  04/22/12   [provider]  Spacer/Aero-Holding Chambers (AEROCHAMBER PLUS WITH MASK) inhaler Use as instructed 03/02/16   Noland Fordyce, PA-C  trolamine salicylate (ASPERCREME) 10 % cream Apply 1 application topically as needed for muscle pain.    [provider]    Family History Family History  Problem Relation Age of Onset  . Diabetes Father   . Prostate cancer Father        meta to colon  . Cancer Father        Prostate  . Parkinsonism Mother   . Stroke Paternal Uncle   . Hypertension Paternal Uncle   . Heart attack Neg Hx     Social History Social History  Substance Use Topics  . Smoking status: Former Smoker    Packs/day: 1.00    Years: 54.00    Types: Cigarettes    Quit date:  10/05/1998  . Smokeless tobacco: Never Used  . Alcohol use No     Allergies   Iohexol; Sulfonamide derivatives; Latex; and Tranilast   Review of Systems Review of Systems  Constitutional: Negative for fever.  Cardiovascular: Negative for leg swelling.  Gastrointestinal: Negative for abdominal pain, nausea and vomiting.  Musculoskeletal: Negative for back pain, gait problem, myalgias and neck pain.  Neurological: Negative for dizziness, syncope, facial asymmetry, speech difficulty, weakness, numbness and headaches.  All other systems reviewed and are negative.    Physical Exam Updated Vital Signs BP (!) 107/92   Pulse (!) 56   Temp 97.9 F (36.6 C) (Oral)   Resp (!) 22   Ht 5\' 3"  (1.6 m)   Wt 149 lb (67.6 kg)   SpO2 97%   BMI 26.39 kg/m   Physical Exam  Constitutional: She is oriented to person, place, and time. She appears well-developed and well-nourished. No distress.  HENT:  Head: Normocephalic and atraumatic.  Right Ear: Hearing normal.  Left Ear: Hearing normal.  Nose: Nose normal.  Mouth/Throat: Oropharynx is clear and moist and mucous membranes are normal.  Eyes: Conjunctivae and EOM are normal. Pupils are equal, round, and reactive to light.  Neck: Normal range of motion. Neck supple.  Cardiovascular: Regular rhythm, S1 normal and S2 normal.  Exam reveals no gallop and no friction rub.   No murmur heard. Pulmonary/Chest: Effort normal and breath sounds normal. No respiratory distress. She exhibits no tenderness.  Abdominal: Soft. Normal appearance and bowel sounds are normal. There is no hepatosplenomegaly. There is no tenderness. There is no rebound, no guarding, no tenderness at McBurney's point and negative Murphy's sign. No hernia.  Musculoskeletal: Normal range of motion.  Neurological: She is alert and oriented to person, place, and time. She has normal strength. No cranial nerve deficit or sensory deficit. Coordination normal. GCS eye subscore is 4. GCS  verbal subscore is 5. GCS  motor subscore is 6.  Extraocular muscle movement: normal No visual field cut Pupils: equal and reactive both direct and consensual response is normal No nystagmus present    Sensory function is intact to light touch, pinprick Proprioception intact  Grip strength 5/5 symmetric in upper extremities No pronator drift Normal finger to nose bilaterally  Lower extremity strength 5/5 against gravity Normal heel to shin bilaterally  Gait: normal   Skin: Skin is warm, dry and intact. No rash noted. No cyanosis.  Psychiatric: She has a normal mood and affect. Her speech is normal and behavior is normal. Thought content normal.  Nursing note and vitals reviewed.    ED Treatments / Results  DIAGNOSTIC STUDIES: Oxygen Saturation is 95% on RA, adequate by my interpretation.    COORDINATION OF CARE: 11:36 PM Discussed treatment plan with pt and family at bedside. They agreed to plan.  1:14 AM Briefly discussed presentation of sx with Dr. Leonel Ramsay from Neurology. He believes that pt can be d/c home. No suspicion for TIA. Not opposed to pt getting an outpatient MRI, but no emergent need for one at this time.   Labs (all labs ordered are listed, but only abnormal results are displayed) Labs Reviewed  BASIC METABOLIC PANEL - Abnormal; Notable for the following:       Result Value   Glucose, Bld 121 (*)    Creatinine, Ser 1.14 (*)    GFR calc non Af Amer 42 (*)    GFR calc Af Amer 48 (*)    All other components within normal limits  URINALYSIS, ROUTINE W REFLEX MICROSCOPIC - Abnormal; Notable for the following:    Leukocytes, UA TRACE (*)    All other components within normal limits  CBC WITH DIFFERENTIAL/PLATELET    EKG  EKG Interpretation  Date/Time:  Saturday Jun 22 2016 23:46:07 EDT Ventricular Rate:  65 PR Interval:    QRS Duration: 146 QT Interval:  464 QTC Calculation: 483 R Axis:   -8 Text Interpretation:  Sinus rhythm Right bundle branch  block Anterolateral infarct, age indeterminate Confirmed by POLLINA  MD, CHRISTOPHER [phone]) on 06/23/2016 12:58:31 AM       Radiology Mr Brain Wo Contrast  Result Date: 06/23/2016 CLINICAL DATA:  83 y/o  F; left leg weakness. EXAM: MRI HEAD WITHOUT CONTRAST TECHNIQUE: Multiplanar, multiecho pulse sequences of the brain and surrounding structures were obtained without intravenous contrast. COMPARISON:  04/15/2006 MRI of the brain. FINDINGS: Brain: Small focus of reduced diffusion in the right anterior body of corpus callosum extending into right frontal periventricular white matter (series 400, image 34) compatible with acute/early subacute infarction. No evidence for acute hemorrhage. There are multiple scattered foci of T2 FLAIR hyperintense signal abnormality in subcortical and periventricular white matter that are nonspecific but compatible with mild chronic microvascular ischemic changes for age and mildly progressed from 2008. Additionally there is mild progression of parenchymal volume loss. No hydrocephalus or extra-axial collection. No focal mass effect. Vascular: Normal flow voids. Skull and upper cervical spine: Normal marrow signal. Sinuses/Orbits: Trace right mastoid effusion. Mild ethmoid sinus mucosal thickening. Other: None. IMPRESSION: 1. Small acute/early subacute infarction centered in the anterior body of corpus callosum extending into right frontal periventricular white matter. No acute hemorrhage identified. 2. Mild progression of chronic microvascular ischemic changes and parenchymal volume loss of the brain from 2008. 3. Trace right mastoid effusion. These results will be called to the ordering clinician or representative by the Radiologist Assistant, and communication documented in the PACS  or zVision Dashboard. Electronically Signed   By: Kristine Garbe M.D.   On: 06/23/2016 03:24    Procedures Procedures (including critical care time)  Medications Ordered in  ED Medications - No data to display   Initial Impression / Assessment and Plan / ED Course  I have reviewed the triage vital signs and the nursing notes.  Pertinent labs & imaging results that were available during my care of the patient were reviewed by me and considered in my medical decision making (see chart for details).     Patient presented to the ER for evaluation of fall. Patient reports that she was experiencing intermittent clumsiness of the left leg, felt like it kept giving out. This was the cause of her fall. She reports that she fell to the left side, no injury. At time of my evaluation she reports that she was not experiencing any neurologic symptoms, that the symptoms were intermittent at home. The first time she felt any abnormality of the left leg was around 5:30 PM, approximately 6 hours before arrival in the ER. Examination was essentially negative for any weakness or sensory deficit. Patient was not felt to be a candidate for code stroke secondary to lack of findings on examination as well as timing of onset.  MRI of brain has been performed. There is evidence of small acute or early subacute infarction centered in the anterior body of the corpus callosum extending into right frontal periventricular white matter.  I personally performed the services described in this documentation, which was scribed in my presence. The recorded information has been reviewed and is accurate.  Final Clinical Impressions(s) / ED Diagnoses   Final diagnoses:  Cerebrovascular accident (CVA), unspecified mechanism [facility])    New Prescriptions New Prescriptions   No medications on file     Orpah Greek, MD 06/23/16 [id number]

## 2016-06-22 NOTE — ED Triage Notes (Signed)
Brought by [facility] from home. Reports right knee gave out on her while walking.  Denies and LOC or pain.  Reports she did not hit her head or injure herself.  Per ems patient crawled to door to let them in but was able to bear weight with help getting up.

## 2016-06-22 NOTE — ED Notes (Signed)
Nurse drawing labs. 

## 2016-06-23 ENCOUNTER — Inpatient Hospital Stay (HOSPITAL_COMMUNITY): Payer: Medicare Other

## 2016-06-23 ENCOUNTER — Encounter (HOSPITAL_COMMUNITY): Payer: Self-pay

## 2016-06-23 ENCOUNTER — Encounter (HOSPITAL_COMMUNITY): Payer: Self-pay | Admitting: Family Medicine

## 2016-06-23 ENCOUNTER — Emergency Department (HOSPITAL_COMMUNITY): Payer: Medicare Other

## 2016-06-23 DIAGNOSIS — Z888 Allergy status to other drugs, medicaments and biological substances status: Secondary | ICD-10-CM | POA: Diagnosis not present

## 2016-06-23 DIAGNOSIS — I252 Old myocardial infarction: Secondary | ICD-10-CM | POA: Diagnosis not present

## 2016-06-23 DIAGNOSIS — E785 Hyperlipidemia, unspecified: Secondary | ICD-10-CM | POA: Diagnosis present

## 2016-06-23 DIAGNOSIS — N182 Chronic kidney disease, stage 2 (mild): Secondary | ICD-10-CM | POA: Diagnosis not present

## 2016-06-23 DIAGNOSIS — I10 Essential (primary) hypertension: Secondary | ICD-10-CM | POA: Diagnosis not present

## 2016-06-23 DIAGNOSIS — I2510 Atherosclerotic heart disease of native coronary artery without angina pectoris: Secondary | ICD-10-CM

## 2016-06-23 DIAGNOSIS — I340 Nonrheumatic mitral (valve) insufficiency: Secondary | ICD-10-CM | POA: Diagnosis not present

## 2016-06-23 DIAGNOSIS — Z833 Family history of diabetes mellitus: Secondary | ICD-10-CM | POA: Diagnosis not present

## 2016-06-23 DIAGNOSIS — I6523 Occlusion and stenosis of bilateral carotid arteries: Secondary | ICD-10-CM

## 2016-06-23 DIAGNOSIS — I639 Cerebral infarction, unspecified: Secondary | ICD-10-CM

## 2016-06-23 DIAGNOSIS — Z7982 Long term (current) use of aspirin: Secondary | ICD-10-CM | POA: Diagnosis not present

## 2016-06-23 DIAGNOSIS — Z823 Family history of stroke: Secondary | ICD-10-CM | POA: Diagnosis not present

## 2016-06-23 DIAGNOSIS — I4510 Unspecified right bundle-branch block: Secondary | ICD-10-CM | POA: Diagnosis present

## 2016-06-23 DIAGNOSIS — Z87891 Personal history of nicotine dependence: Secondary | ICD-10-CM | POA: Diagnosis not present

## 2016-06-23 DIAGNOSIS — I129 Hypertensive chronic kidney disease with stage 1 through stage 4 chronic kidney disease, or unspecified chronic kidney disease: Secondary | ICD-10-CM | POA: Diagnosis present

## 2016-06-23 DIAGNOSIS — I638 Other cerebral infarction: Secondary | ICD-10-CM | POA: Diagnosis not present

## 2016-06-23 DIAGNOSIS — I63421 Cerebral infarction due to embolism of right anterior cerebral artery: Secondary | ICD-10-CM | POA: Diagnosis present

## 2016-06-23 DIAGNOSIS — Z8619 Personal history of other infectious and parasitic diseases: Secondary | ICD-10-CM | POA: Diagnosis not present

## 2016-06-23 DIAGNOSIS — I071 Rheumatic tricuspid insufficiency: Secondary | ICD-10-CM | POA: Diagnosis present

## 2016-06-23 DIAGNOSIS — Z955 Presence of coronary angioplasty implant and graft: Secondary | ICD-10-CM | POA: Diagnosis not present

## 2016-06-23 DIAGNOSIS — G8314 Monoplegia of lower limb affecting left nondominant side: Secondary | ICD-10-CM | POA: Diagnosis present

## 2016-06-23 DIAGNOSIS — K219 Gastro-esophageal reflux disease without esophagitis: Secondary | ICD-10-CM | POA: Diagnosis present

## 2016-06-23 DIAGNOSIS — Q283 Other malformations of cerebral vessels: Secondary | ICD-10-CM | POA: Diagnosis not present

## 2016-06-23 DIAGNOSIS — R29700 NIHSS score 0: Secondary | ICD-10-CM | POA: Diagnosis present

## 2016-06-23 DIAGNOSIS — I255 Ischemic cardiomyopathy: Secondary | ICD-10-CM | POA: Diagnosis not present

## 2016-06-23 DIAGNOSIS — E1122 Type 2 diabetes mellitus with diabetic chronic kidney disease: Secondary | ICD-10-CM | POA: Diagnosis present

## 2016-06-23 DIAGNOSIS — E1151 Type 2 diabetes mellitus with diabetic peripheral angiopathy without gangrene: Secondary | ICD-10-CM | POA: Diagnosis present

## 2016-06-23 DIAGNOSIS — Z8249 Family history of ischemic heart disease and other diseases of the circulatory system: Secondary | ICD-10-CM | POA: Diagnosis not present

## 2016-06-23 DIAGNOSIS — Z91040 Latex allergy status: Secondary | ICD-10-CM | POA: Diagnosis not present

## 2016-06-23 DIAGNOSIS — Z882 Allergy status to sulfonamides status: Secondary | ICD-10-CM | POA: Diagnosis not present

## 2016-06-23 DIAGNOSIS — Z8711 Personal history of peptic ulcer disease: Secondary | ICD-10-CM | POA: Diagnosis not present

## 2016-06-23 LAB — URINALYSIS, ROUTINE W REFLEX MICROSCOPIC
BILIRUBIN URINE: NEGATIVE
Bacteria, UA: NONE SEEN
Glucose, UA: NEGATIVE mg/dL
HGB URINE DIPSTICK: NEGATIVE
Ketones, ur: NEGATIVE mg/dL
Nitrite: NEGATIVE
Protein, ur: NEGATIVE mg/dL
SPECIFIC GRAVITY, URINE: 1.005 (ref 1.005–1.030)
Squamous Epithelial / LPF: NONE SEEN
pH: 5 (ref 5.0–8.0)

## 2016-06-23 LAB — ECHOCARDIOGRAM COMPLETE
CHL CUP MV DEC (S): 264
E decel time: 264 msec
E/e' ratio: 15.32
FS: 40 % (ref 28–44)
Height: 64 in
IV/PV OW: 0.74
LA ID, A-P, ES: 37 mm
LADIAMINDEX: 2.13 cm/m2
LAVOL: 50.3 mL
LAVOLA4C: 42 mL
LAVOLIN: 28.9 mL/m2
LDCA: 2.54 cm2
LEFT ATRIUM END SYS DIAM: 37 mm
LV E/e' medial: 15.32
LV PW d: 12.2 mm — AB (ref 0.6–1.1)
LV TDI E'LATERAL: 9.4
LVEEAVG: 15.32
LVELAT: 9.4 cm/s
LVOTD: 18 mm
MV Peak grad: 8 mmHg
MV pk A vel: 73.7 m/s
MVPKEVEL: 144 m/s
RV TAPSE: 27.4 mm
TDI e' medial: 4.9
Weight: 2419.2 oz

## 2016-06-23 LAB — BASIC METABOLIC PANEL
ANION GAP: 10 (ref 5–15)
BUN: 18 mg/dL (ref 6–20)
CALCIUM: 8.9 mg/dL (ref 8.9–10.3)
CO2: 24 mmol/L (ref 22–32)
Chloride: 104 mmol/L (ref 101–111)
Creatinine, Ser: 1.14 mg/dL — ABNORMAL HIGH (ref 0.44–1.00)
GFR calc Af Amer: 48 mL/min — ABNORMAL LOW (ref >60)
GFR, EST NON AFRICAN AMERICAN: 42 mL/min — AB (ref >60)
Glucose, Bld: 121 mg/dL — ABNORMAL HIGH (ref 65–99)
POTASSIUM: 3.5 mmol/L (ref 3.5–5.1)
SODIUM: 138 mmol/L (ref 135–145)

## 2016-06-23 LAB — LIPID PANEL
Cholesterol: 102 mg/dL (ref 0–200)
HDL: 42 mg/dL (ref >40)
LDL CALC: 37 mg/dL (ref 0–99)
TRIGLYCERIDES: 116 mg/dL (ref <150)
Total CHOL/HDL Ratio: 2.4 RATIO
VLDL: 23 mg/dL (ref 0–40)

## 2016-06-23 MED ORDER — BENZONATATE 100 MG PO CAPS
100.0000 mg | ORAL_CAPSULE | Freq: Three times a day (TID) | ORAL | Status: DC | PRN
  Filled 2016-06-23: qty 1

## 2016-06-23 MED ORDER — ACETAMINOPHEN 160 MG/5ML PO SOLN: 650.0000 mg | ORAL | Status: DC | PRN

## 2016-06-23 MED ORDER — ROSUVASTATIN CALCIUM 5 MG PO TABS
10.0000 mg | ORAL_TABLET | Freq: Every day | ORAL | Status: DC
  Administered 2016-06-23 – 2016-06-24 (×2): 10 mg via ORAL
  Filled 2016-06-23: qty 2
  Filled 2016-06-23 (×2): qty 1

## 2016-06-23 MED ORDER — PANTOPRAZOLE SODIUM 40 MG PO TBEC: 40.0000 mg | DELAYED_RELEASE_TABLET | Freq: Every day | ORAL | Status: DC | PRN

## 2016-06-23 MED ORDER — POLYVINYL ALCOHOL 1.4 % OP SOLN
1.0000 [drp] | Freq: Every day | OPHTHALMIC | Status: DC | PRN
  Filled 2016-06-23: qty 15

## 2016-06-23 MED ORDER — ENOXAPARIN SODIUM 40 MG/0.4ML ~~LOC~~ SOLN
40.0000 mg | SUBCUTANEOUS | Status: DC
  Administered 2016-06-23 – 2016-06-24 (×2): 40 mg via SUBCUTANEOUS
  Filled 2016-06-23 (×3): qty 0.4

## 2016-06-23 MED ORDER — ACETAMINOPHEN 650 MG RE SUPP: 650.0000 mg | RECTAL | Status: DC | PRN

## 2016-06-23 MED ORDER — POLYETHYLENE GLYCOL 3350 17 GM/SCOOP PO POWD
17.0000 g | Freq: Every day | ORAL | Status: DC
  Filled 2016-06-23: qty 255

## 2016-06-23 MED ORDER — CENTRUM SILVER PO TABS: 1.0000 | ORAL_TABLET | Freq: Every day | ORAL | Status: DC

## 2016-06-23 MED ORDER — POLYETHYLENE GLYCOL 3350 17 G PO PACK: 17.0000 g | PACK | Freq: Every day | ORAL | Status: DC

## 2016-06-23 MED ORDER — LABETALOL HCL 5 MG/ML IV SOLN: 5.0000 mg | INTRAVENOUS | Status: DC | PRN

## 2016-06-23 MED ORDER — ALBUTEROL SULFATE (2.5 MG/3ML) 0.083% IN NEBU
3.0000 mL | INHALATION_SOLUTION | Freq: Four times a day (QID) | RESPIRATORY_TRACT | Status: DC | PRN
Start: 2016-06-23 — End: 2016-06-24

## 2016-06-23 MED ORDER — DIPHENHYDRAMINE HCL 25 MG PO CAPS: 25.0000 mg | ORAL_CAPSULE | ORAL | Status: DC | PRN

## 2016-06-23 MED ORDER — ACETAMINOPHEN 325 MG PO TABS: 650.0000 mg | ORAL_TABLET | ORAL | Status: DC | PRN

## 2016-06-23 MED ORDER — SODIUM CHLORIDE 0.9 % IV BOLUS (SEPSIS)
500.0000 mL | Freq: Once | INTRAVENOUS | Status: AC
  Administered 2016-06-23: 500 mL via INTRAVENOUS

## 2016-06-23 MED ORDER — ASPIRIN EC 81 MG PO TBEC
81.0000 mg | DELAYED_RELEASE_TABLET | Freq: Every day | ORAL | Status: DC
  Administered 2016-06-23 – 2016-06-24 (×2): 81 mg via ORAL
  Filled 2016-06-23 (×3): qty 1

## 2016-06-23 MED ORDER — GABAPENTIN 100 MG PO CAPS
100.0000 mg | ORAL_CAPSULE | Freq: Three times a day (TID) | ORAL | Status: DC
  Administered 2016-06-23: 100 mg via ORAL
  Filled 2016-06-23: qty 1

## 2016-06-23 MED ORDER — SODIUM CHLORIDE 0.9 % IV SOLN
INTRAVENOUS | Status: AC
  Administered 2016-06-23: 06:00:00 via INTRAVENOUS

## 2016-06-23 MED ORDER — GABAPENTIN 100 MG PO CAPS
100.0000 mg | ORAL_CAPSULE | Freq: Two times a day (BID) | ORAL | Status: DC
  Administered 2016-06-24: 100 mg via ORAL
  Filled 2016-06-23: qty 1

## 2016-06-23 MED ORDER — ADULT MULTIVITAMIN W/MINERALS CH
1.0000 | ORAL_TABLET | Freq: Every day | ORAL | Status: DC
  Administered 2016-06-23 – 2016-06-24 (×2): 1 via ORAL
  Filled 2016-06-23 (×2): qty 1

## 2016-06-23 MED ORDER — STROKE: EARLY STAGES OF RECOVERY BOOK
Freq: Once | Status: AC
  Administered 2016-06-23: 06:00:00
  Filled 2016-06-23: qty 1

## 2016-06-23 MED ORDER — CLOPIDOGREL BISULFATE 75 MG PO TABS
75.0000 mg | ORAL_TABLET | Freq: Every day | ORAL | Status: DC
  Administered 2016-06-23 – 2016-06-24 (×2): 75 mg via ORAL
  Filled 2016-06-23 (×2): qty 1

## 2016-06-23 MED ORDER — SERTRALINE HCL 50 MG PO TABS
25.0000 mg | ORAL_TABLET | Freq: Every day | ORAL | Status: DC
  Administered 2016-06-23 – 2016-06-24 (×2): 25 mg via ORAL
  Filled 2016-06-23 (×3): qty 1

## 2016-06-23 MED ORDER — CHOLECALCIFEROL 10 MCG (400 UNIT) PO TABS
400.0000 [IU] | ORAL_TABLET | Freq: Every day | ORAL | Status: DC
  Administered 2016-06-23 – 2016-06-24 (×2): 400 [IU] via ORAL
  Filled 2016-06-23 (×2): qty 1

## 2016-06-23 NOTE — H&P (Signed)
History and Physical    Stacy [name] VWU:[id number] DOB: 06/06/1926 DOA: 06/22/2016  PCP: Lillard Anes, MD   Patient coming from: Home  Chief Complaint: Left leg weakness  HPI: Stacy [name] is a 83 y.o. female with medical history significant for hypertension, diet-controlled diabetes mellitus, chronic kidney disease stage II, coronary artery disease with stent, and carotid artery stenosis status post right CEA, now presenting to the emergency department for evaluation of left leg weakness and resulting fall. Patient reports that she had been in her usual state of health until the afternoon of 06/22/2016 when she developed a transient weakness involving her left leg. This seemed to resolve and she suspected that it was secondary to her degenerative lumbar disease as she has experienced similar symptoms involving the right leg which has been attributed to sciatica. She had a recurrence in left leg weakness overnight, describing that the "leg gave out from under me." This resulted in a fall onto her left side without head strike or loss of consciousness. There was no pain after the fall, but the leg was too weak for her to stand. EMS was called out to the house and she had to crawl to the door to let them in. She was later able to bear weight when helped up by the medics. She denies any recent headache, change in vision or hearing, or focal numbness. She denies chest pain or palpitations and denies fevers or chills.   ED Course: Upon arrival to the ED, patient is found to be afebrile, saturating well on room air, and with vital signs stable. EKG features a sinus rhythm with chronic right bundle-branch block. Chemistry panels notable for a creatinine of 1.14, up from an apparent baseline of 0.9. CBC is unremarkable and urinalysis is also unremarkable. MRI brain was performed and reveals a small acute/early subacute infarct in the anterior body of the corpus callosum, as well as mild progression of  her microvascular ischemic changes. Neurology was consulted by the ED physician and advised for medical admission. Patient has remained hemodynamically stable in the ED and in no apparent respiratory distress and will be admitted to the telemetry unit for ongoing evaluation and management of acute ischemic CVA.  Review of Systems:  All other systems reviewed and apart from HPI, are negative.  Past Medical History:  Diagnosis Date  . Anxiety   . Arthritis    Cervical DDD C5-6 and C6-7  . Atheroembolism   . Blue toe syndrome ([place])   . CAD (coronary artery disease)   . Cancer ([place])    skin BCC  Forehead and  SCC Left Lower Leg  . Carotid artery occlusion   . Chronic kidney disease, stage III (moderate)   . DDD (degenerative disc disease)   . Depression   . DM (diabetes mellitus) ([place])   . Gallstones   . GERD (gastroesophageal reflux disease)   . Heart murmur   . Hemorrhoid   . Hemorrhoids   . Hiatal hernia   . History of shingles   . Hyperlipidemia   . Hypertension   . Leg pain   . Mild incontinence   . Multiple thyroid nodules    checked every year  . Myocardial infarction ([place]) 03/15/95  . Osteopenia   . Peptic ulcer disease   . Thyroid nodule     Past Surgical History:  Procedure Laterality Date  . APPENDECTOMY    . Basal Cell Cancer Removal     forehead  . CATARACT  EXTRACTION    . CORONARY ANGIOPLASTY  03-15-1995  . CORONARY ANGIOPLASTY WITH STENT PLACEMENT  05-19-1998  . CORONARY STENT PLACEMENT  2000  . DILATION AND CURETTAGE OF UTERUS    . ENDARTERECTOMY Right 04/13/2015   Procedure: ENDARTERECTOMY CAROTID;  Surgeon: Serafina Mitchell, MD;  Location: [facility];  Service: Vascular;  Laterality: Right;  . ENDOMETRIAL BIOPSY    . EYE SURGERY    . HEMORRHOID SURGERY    . INCISIONAL HERNIA REPAIR    . INGUINAL HERNIA REPAIR    . PATCH ANGIOPLASTY Right 04/13/2015   Procedure: PATCH ANGIOPLASTY;  Surgeon: Serafina Mitchell, MD;  Location: [place];  Service: Vascular;   Laterality: Right;  . PTCA    . SQUAMOUS CELL CARCINOMA EXCISION     left lower leg  . TONSILLECTOMY       reports that she quit smoking about 17 years ago. Her smoking use included Cigarettes. She has a 54.00 pack-year smoking history. She has never used smokeless tobacco. She reports that she does not drink alcohol or use drugs.  Allergies  Allergen Reactions  . Iohexol Other (See Comments)     Desc: HIVES- 13 HR PRE-MEDS ARE REQUIRED-ASM- 03/21/05,SULFA,ADHESIVE TAPE   . Sulfonamide Derivatives Hives    REACTION: hives  . Latex Other (See Comments)    Adhesive tape and EKG adhesive leads to rash  . Tranilast Hives    Investigational medication    Family History  Problem Relation Age of Onset  . Diabetes Father   . Prostate cancer Father        meta to colon  . Cancer Father        Prostate  . Parkinsonism Mother   . Stroke Paternal Uncle   . Hypertension Paternal Uncle   . Heart attack Neg Hx      Prior to Admission medications   Medication Sig Start Date End Date Taking? Authorizing Provider  ACCU-CHEK SMARTVIEW test strip 1 each by Other route as needed for other (blood sugar test strips).  03/29/14   [provider]  acetaminophen (TYLENOL) 325 MG tablet Take 325 mg by mouth every 6 (six) hours as needed for mild pain or moderate pain.    [provider]  albuterol (PROVENTIL HFA;VENTOLIN HFA) 108 (90 Base) MCG/ACT inhaler Inhale 1-2 puffs into the lungs every 6 (six) hours as needed for wheezing or shortness of breath. 03/02/16   Noland Fordyce, PA-C  aspirin 81 MG tablet Take 1 tablet (81 mg total) by mouth daily. 11/04/12   Sherren Mocha, MD  benzonatate (TESSALON) 100 MG capsule Take 100 mg by mouth 3 (three) times daily as needed for cough. 03/11/16   [provider]  Calcium Carbonate Antacid (TUMS PO) Take 1 Dose by mouth as directed.     [provider]  cholecalciferol (VITAMIN D) 400 UNITS TABS Take 400 Units by mouth  daily.     [provider]  clopidogrel (PLAVIX) 75 MG tablet Take 1 tablet (75 mg total) by mouth daily. 10/30/15   Sherren Mocha, MD  Coenzyme Q10 (COQ-10 PO) Take 50 mg by mouth daily. 1 tab po qd    [provider]  conjugated estrogens (PREMARIN) vaginal cream Place 1 g vaginally once a week. .625 mg once weekly    [provider]  dexlansoprazole (DEXILANT) 60 MG capsule Take 60 mg by mouth daily as needed (for acid reflux). Take 1 tablet ( Dexilant DR ) by mouth once daily  [provider]  diphenhydrAMINE (BENADRYL) 25 mg capsule Take 25 mg by mouth as needed for allergies.     [provider]  gabapentin (NEURONTIN) 100 MG capsule Take 100 mg by mouth 3 (three) times daily. 05/08/16   [provider]  hydrochlorothiazide (MICROZIDE) 12.5 MG capsule Take 1 capsule (12.5 mg total) by mouth daily. 10/30/15   Sherren Mocha, MD  hydrocortisone 2.5 % cream Apply 1 application topically daily as needed (for itching).  12/28/13   [provider]  Melatonin 5 MG CAPS Take 1 capsule by mouth at bedtime as needed (sleep).     [provider]  metoprolol succinate (TOPROL-XL) 25 MG 24 hr tablet Take 1 tablet (25 mg total) by mouth daily. 10/30/15   Sherren Mocha, MD  Multiple Vitamins-Minerals (CENTRUM SILVER PO) Take 1 tablet by mouth daily. 1 tab po qd     [provider]  NEOMYCIN-POLYMYXIN-HYDROCORTISONE (CORTISPORIN) 1 % SOLN otic solution Place 2 drops into both ears as directed.  01/27/14   [provider]  nitroGLYCERIN (NITROSTAT) 0.4 MG SL tablet Place 1 tablet (0.4 mg total) under the tongue every 5 (five) minutes as needed for chest pain. 03/28/15   Sherren Mocha, MD  Polyethylene Glycol 400 (BLINK TEARS OP) Place 1 drop into both eyes daily as needed (for dry eye). Reported on 05/05/2015    [provider]  polyethylene glycol powder (GLYCOLAX/MIRALAX) powder Take 17 g by mouth daily.  Reported on 06/12/2015    [provider]  ramipril (ALTACE) 2.5 MG capsule TAKE 1 CAPSULE (2.5 MG TOTAL) BY MOUTH 2 (TWO) TIMES DAILY. 10/30/15   Sherren Mocha, MD  rosuvastatin (CRESTOR) 10 MG tablet Take 1 tablet (10 mg total) by mouth daily. 10/30/15   Sherren Mocha, MD  sertraline (ZOLOFT) 25 MG tablet Take 25 mg by mouth daily.  04/22/12   [provider]  Spacer/Aero-Holding Chambers (AEROCHAMBER PLUS WITH MASK) inhaler Use as instructed 03/02/16   Noland Fordyce, PA-C  trolamine salicylate (ASPERCREME) 10 % cream Apply 1 application topically as needed for muscle pain.    [provider]    Physical Exam: Vitals:   06/23/16 0315 06/23/16 0400 06/23/16 0401 06/23/16 0402  BP: 118/67 (!) 123/57    Pulse: (!) 55 (!) 54    Resp: 14 19    Temp:   97.7 F (36.5 C) 97.7 F (36.5 C)  TempSrc:   Oral   SpO2: 99% 97%    Weight:      Height:          Constitutional: NAD, calm, comfortable Eyes: PERTLA, lids and conjunctivae normal ENMT: Mucous membranes are moist. Posterior pharynx clear of any exudate or lesions.   Neck: normal, supple, no masses, no thyromegaly Respiratory: clear to auscultation bilaterally, no wheezing, no crackles. Normal respiratory effort.  Cardiovascular: S1 & S2 heard, regular rate and rhythm. No significant JVD. Abdomen: No distension, no tenderness, no masses palpated. Bowel sounds normal.  Musculoskeletal: no clubbing / cyanosis. No joint deformity upper and lower extremities.   Skin: no significant rashes, lesions, ulcers. Warm, dry, well-perfused. Neurologic: CN 2-12 grossly intact. Sensation intact, DTR normal. Strength 5/5 in all 4 limbs.  Psychiatric: Alert and oriented x 3. Pleasant and cooperative.     Labs on Admission: I have personally reviewed following labs and imaging studies  CBC:  Recent Labs Lab 06/22/16 2345  WBC 8.8  NEUTROABS 4.3  HGB 13.4  HCT 41.3  MCV 89.6  PLT  381   Basic Metabolic  Panel:  Recent Labs Lab 06/22/16 2345  NA 138  K 3.5  CL 104  CO2 24  GLUCOSE 121*  BUN 18  CREATININE 1.14*  CALCIUM 8.9   GFR: Estimated Creatinine Clearance: 31.5 mL/min (A) (by C-G formula based on SCr of 1.14 mg/dL (H)). Liver Function Tests: No results for input(s): AST, ALT, ALKPHOS, BILITOT, PROT, ALBUMIN in the last 168 hours. No results for input(s): LIPASE, AMYLASE in the last 168 hours. No results for input(s): AMMONIA in the last 168 hours. Coagulation Profile: No results for input(s): INR, PROTIME in the last 168 hours. Cardiac Enzymes: No results for input(s): CKTOTAL, CKMB, CKMBINDEX, TROPONINI in the last 168 hours. BNP (last 3 results) No results for input(s): PROBNP in the last 8760 hours. HbA1C: No results for input(s): HGBA1C in the last 72 hours. CBG: No results for input(s): GLUCAP in the last 168 hours. Lipid Profile: No results for input(s): CHOL, HDL, LDLCALC, TRIG, CHOLHDL, LDLDIRECT in the last 72 hours. Thyroid Function Tests: No results for input(s): TSH, T4TOTAL, FREET4, T3FREE, THYROIDAB in the last 72 hours. Anemia Panel: No results for input(s): VITAMINB12, FOLATE, FERRITIN, TIBC, IRON, RETICCTPCT in the last 72 hours. Urine analysis:    Component Value Date/Time   COLORURINE YELLOW 06/22/2016 0015   APPEARANCEUR CLEAR 06/22/2016 0015   LABSPEC 1.005 06/22/2016 0015   PHURINE 5.0 06/22/2016 0015   GLUCOSEU NEGATIVE 06/22/2016 0015   HGBUR NEGATIVE 06/22/2016 0015   BILIRUBINUR NEGATIVE 06/22/2016 0015   KETONESUR NEGATIVE 06/22/2016 0015   PROTEINUR NEGATIVE 06/22/2016 0015   UROBILINOGEN 0.2 12/28/2011 1557   NITRITE NEGATIVE 06/22/2016 0015   LEUKOCYTESUR TRACE (A) 06/22/2016 0015   Sepsis Labs: @LABRCNTIP (procalcitonin:4,lacticidven:4) )No results found for this or any previous visit (from the past 240 hour(s)).   Radiological Exams on Admission: Mr Brain Wo Contrast  Result Date: 06/23/2016 CLINICAL DATA:  83 y/o  F;  left leg weakness. EXAM: MRI HEAD WITHOUT CONTRAST TECHNIQUE: Multiplanar, multiecho pulse sequences of the brain and surrounding structures were obtained without intravenous contrast. COMPARISON:  04/15/2006 MRI of the brain. FINDINGS: Brain: Small focus of reduced diffusion in the right anterior body of corpus callosum extending into right frontal periventricular white matter (series 400, image 34) compatible with acute/early subacute infarction. No evidence for acute hemorrhage. There are multiple scattered foci of T2 FLAIR hyperintense signal abnormality in subcortical and periventricular white matter that are nonspecific but compatible with mild chronic microvascular ischemic changes for age and mildly progressed from 2008. Additionally there is mild progression of parenchymal volume loss. No hydrocephalus or extra-axial collection. No focal mass effect. Vascular: Normal flow voids. Skull and upper cervical spine: Normal marrow signal. Sinuses/Orbits: Trace right mastoid effusion. Mild ethmoid sinus mucosal thickening. Other: None. IMPRESSION: 1. Small acute/early subacute infarction centered in the anterior body of corpus callosum extending into right frontal periventricular white matter. No acute hemorrhage identified. 2. Mild progression of chronic microvascular ischemic changes and parenchymal volume loss of the brain from 2008. 3. Trace right mastoid effusion. These results will be called to the ordering clinician or representative by the Radiologist Assistant, and communication documented in the PACS or zVision Dashboard. Electronically Signed   By: Kristine Garbe M.D.   On: 06/23/2016 03:24    EKG: Independently reviewed. Sinus rhythm, RBBB, no significant change from prior.   Assessment/Plan  1. Acute ischemic stroke  - Pt presents with transient left leg weakness, MRI reveals small acute/early subacute infarct in anterior  body of corpus collosum  - Neurology is consulting and much  appreciated  - She has passed swallow screen in ED  - Plan for cardiac monitoring, frequent neuro checks, PT and OT consultation, MRA head, carotid dopplers, echocardiogram, fasting lipid panel, and A1c  - Maintain normothermia, euglycemia, euvolemia; permit HTN to 327/556 in acute phase  - Continue Plavix and ASA 81   2. Carotid artery stenosis  - She underwent right CEA in 2017  - Carotid US ordered as above  - Continue ASA 81, Plavix, statin   3. Hypertension  - BP soft in ED - Managed at home with HCTZ, ramipril, and Toprol  - Antihypertensives held on admission in light of acute ischemic CVA  - Treat with labetalol IVP prn SBP >220 or DBP >120 in acute phase    4. CAD - No anginal complaints, EKG unchanged  - Underwent balloon angioplasty on LAD in 1997, RCA stented in 2000, and cath in 2008 demonstrated patency of these sites  - Continue Plavix, ASA 81, statin; Toprol and ramipril held intitally as above   5. CKD stage II  - SCr is 1.14 on admission, slightly up from priors  - She was given a 500 cc NS bolus in ED and is continued on a gentle IVF hydration  - Hold ramipril as above     DVT prophylaxis: sq Lovenox Code Status: Full  Family Communication: Daughter updated at bedside Disposition Plan: Admit to telemetry Consults called: Neurology Admission status: Inpatient    Vianne Bulls, MD [facility] Pager [phone]  If 7PM-7AM, please contact night-coverage [email or web address] Password [email or web address]  06/23/2016, 4:18 AM

## 2016-06-23 NOTE — Consult Note (Signed)
Neurology Consultation Reason for Consult: Left leg weakness Referring Physician: Pollina, C  CC: Left leg weakness  History is obtained from: Patient  HPI: Stacy [name] is a 83 y.o. female with a history of right ICA stenosis who presents with left leg weakness. He states that it is coming gone over the course of the evening, for scoring around 6:30 PM. She states that most recently, it happened even while she was here after she got up to go the bathroom. Does not present when laying down.   LKW: 6:30 PM tpa given?: no, symptoms resolved, mild symptoms   ROS: A 14 point ROS was performed and is negative except as noted in the HPI.   Past Medical History:  Diagnosis Date  . Anxiety   . Arthritis    Cervical DDD C5-6 and C6-7  . Atheroembolism   . Blue toe syndrome ([place])   . CAD (coronary artery disease)   . Cancer ([place])    skin BCC  Forehead and  SCC Left Lower Leg  . Carotid artery occlusion   . Chronic kidney disease, stage III (moderate)   . DDD (degenerative disc disease)   . Depression   . DM (diabetes mellitus) ([place])   . Gallstones   . GERD (gastroesophageal reflux disease)   . Heart murmur   . Hemorrhoid   . Hemorrhoids   . Hiatal hernia   . History of shingles   . Hyperlipidemia   . Hypertension   . Leg pain   . Mild incontinence   . Multiple thyroid nodules    checked every year  . Myocardial infarction ([place]) 03/15/95  . Osteopenia   . Peptic ulcer disease   . Thyroid nodule      Family History  Problem Relation Age of Onset  . Diabetes Father   . Prostate cancer Father        meta to colon  . Cancer Father        Prostate  . Parkinsonism Mother   . Stroke Paternal Uncle   . Hypertension Paternal Uncle   . Heart attack Neg Hx      Social History:  reports that she quit smoking about 17 years ago. Her smoking use included Cigarettes. She has a 54.00 pack-year smoking history. She has never used smokeless tobacco. She reports that she does not  drink alcohol or use drugs.   Exam: Current vital signs: BP (!) 107/92   Pulse (!) 56   Temp 97.7 F (36.5 C) (Oral)   Resp (!) 22   Ht 5\' 3"  (1.6 m)   Wt 67.6 kg (149 lb)   SpO2 97%   BMI 26.39 kg/m  Vital signs in last 24 hours: Temp:  [97.7 F (36.5 C)-97.9 F (36.6 C)] 97.7 F (36.5 C) (05/13 0401) Pulse Rate:  [55-64] 56 (05/13 0145) Resp:  [14-22] 22 (05/13 0145) BP: (107-137)/(38-92) 107/92 (05/13 0145) SpO2:  [94 %-98 %] 97 % (05/13 0145) Weight:  [67.6 kg (149 lb)] 67.6 kg (149 lb) (05/12 2314)   Physical Exam  Constitutional: Appears well-developed and well-nourished.  Psych: Affect appropriate to situation Eyes: No scleral injection HENT: No OP obstrucion Head: Normocephalic.  Cardiovascular: Normal rate and regular rhythm.  Respiratory: Effort normal and breath sounds normal to anterior ascultation GI: Soft.  No distension. There is no tenderness.  Skin: WDI  Neuro: Mental Status: Patient is awake, alert, oriented to person, place, month, year, and situation. Patient is able to give a clear  and coherent history. No signs of aphasia or neglect Cranial Nerves: II: Visual Fields are full. Pupils are equal, round, and reactive to light.   III,IV, VI: EOMI without ptosis or diploplia.  V: Facial sensation is symmetric to temperature VII: Facial movement is symmetric.  VIII: hearing is intact to voice X: Uvula elevates symmetrically XI: Shoulder shrug is symmetric. XII: tongue is midline without atrophy or fasciculations.  Motor: Tone is normal. Bulk is normal. 5/5 strength was present in all four extremities.  Sensory: Sensation is symmetric to light touch and temperature in the arms and legs. Deep Tendon Reflexes: 2+ and symmetric in the biceps and patellae.  Plantars: Toes are downgoing bilaterally.  Cerebellar: FNF with some intentional tremor bilaterally.   I have reviewed labs in epic and the results pertinent to this consultation  are: BMP-borderline creatinine  I have reviewed the images obtained: MRI brain-ACA territory infarct in the right  Impression: 83 year old female with what I suspect to be an embolic ACA territory infarct. Artery to artery or cardioembolic with both possibilities. Large vessel atherosclerosis resulting in local thrombosis would also be a possibility. She is artery on dual antiplatelet therapy. She seemed to be perfusing even in the 70H systolic when I was in there, but I would be concerned about a pressure sensitive stroke given that it seems to be present when she stands up in therefore I have ordered a normal saline bolus.  Recommendations: 1. HgbA1c, fasting lipid panel 2. MRA  of the brain without contrast 3. Frequent neuro checks 4. Echocardiogram 5. Carotid dopplers 6. Prophylactic therapy-Antiplatelet med: Aspirin - 81mg   and Plavix 75 mg 7. Risk factor modification 8. Telemetry monitoring 9. PT consult, OT consult, Speech consult 10. please page stroke NP  Or  PA  Or MD  from 8am -4 pm as this patient will be followed by the stroke team at this point.   You can look them up on [email or web address]   11. Permissive hypertension to 220/120, would hold antihypertensives.   Roland Rack, MD [facility] [phone]  If 7pm- 7am, please page neurology on call as listed in [place].

## 2016-06-23 NOTE — Progress Notes (Addendum)
*  PRELIMINARY RESULTS* Vascular Ultrasound Carotid Duplex (Doppler) has been completed.  Findings suggest 1-39% internal carotid artery stenosis bilaterally. Vertebral arteries are patent with antegrade flow.   Bilateral lower extremity venous duplex completed. Bilateral lower extremities are negative for deep vein thrombosis. There is no evidence of Baker's cyst bilaterally.  06/23/2016 2:57 PM Maudry Mayhew, BS, RVT, RDCS, RDMS

## 2016-06-23 NOTE — Progress Notes (Signed)
Report received from the ED at 0430 and pt arrived to the unit at 0540. Pt A&O x4; ambulated to the BR with 1 assist to void; pt placed on telemetry and verified with CCMD; NT called to second verify. Pt NIH 0; neuro check wnl; denies any pain, skin clean, dry and intact with no open wounds or pressure ulcer noted. Pt oriented to the unit and room; fall/safety precaution and prevention education completed with pt and daughter at bedside; both voices understanding. Bed alarm on; call light within reach. VSS; will closely monitor and report off to oncoming RN. Delia Heady RN

## 2016-06-23 NOTE — Progress Notes (Signed)
  Echocardiogram 2D Echocardiogram has been performed.  Stacy [name] 06/23/2016, 2:54 PM

## 2016-06-23 NOTE — Progress Notes (Signed)
Patient seen and examined  83 y.o. female with medical history significant for hypertension, diet-controlled diabetes mellitus, chronic kidney disease stage II, coronary artery disease with stent, and carotid artery stenosis status post right CEA, now presenting to the emergency department for evaluation of left leg weakness and resulting fall. MRI on 5/13 showed small acute/early subacute infarction in the anterior corpus callosum and right frontal periventricular white matter  Stroke workup pending,suspect embolic CVA , will need TEE ,ILR 2-D echo, carotid Doppler pending Neurochecks stable Blood pressure soft-holding metoprolol, Altace, will hydrate UA chest x-ray negative

## 2016-06-23 NOTE — Evaluation (Signed)
Physical Therapy Evaluation Patient Details Name: Stacy [name] MRN: [id number] DOB: August 01, 1926 Today's Date: 06/23/2016   History of Present Illness  Patient is an 83 y.o. female admitted 06/22/16 for evaluation of left leg weakness and resulting fall. MRI on 5/13 showed small acute/early subacute infarction in the anterior corpus callosum and right frontal periventricular white matter.     PMH:  hypertension, diet-controlled diabetes mellitus, chronic kidney disease stage II, coronary artery disease with stent, and carotid artery stenosis status post right CEA     Clinical Impression  Patient is functioning at Mod I to Supervision level with all mobility and gait.  Good balance with gait.  Noted only slight weakness in LLE, and patient fearful of knee buckling.  Instructed patient in exercises to strengthen LLE.  Patient also depends on her vision for balance.  Instructed her to make sure to use night lights and avoid dark places.  Patient with no further PT needs - PT will sign off.    Follow Up Recommendations No PT follow up;Supervision - Intermittent    Equipment Recommendations  None recommended by PT    Recommendations for Other Services       Precautions / Restrictions Precautions Precautions: None Restrictions Weight Bearing Restrictions: No      Mobility  Bed Mobility Overal bed mobility: Modified Independent             General bed mobility comments: Increased time  Transfers Overall transfer level: Needs assistance Equipment used: None Transfers: Sit to/from Stand Sit to Stand: Supervision         General transfer comment: for safety, no physical assist needed.  Instructed patient to stand for several seconds prior to gait  Ambulation/Gait Ambulation/Gait assistance: Supervision Ambulation Distance (Feet): 200 Feet Assistive device: None Gait Pattern/deviations: Step-through pattern;Decreased stride length Gait velocity: decreased Gait velocity  interpretation: Below normal speed for age/gender General Gait Details: Patient with good gait pattern and balance.  Decreased speed - patient fearful of Lt knee buckling.  Had patient increase gait velocity without difficulty.  Stairs            Wheelchair Mobility    Modified Rankin (Stroke Patients Only) Modified Rankin (Stroke Patients Only) Pre-Morbid Rankin Score: No symptoms Modified Rankin: Moderately severe disability     Balance Overall balance assessment: Needs assistance Sitting-balance support: Feet unsupported;No upper extremity supported Sitting balance-Leahy Scale: Normal     Standing balance support: No upper extremity supported;During functional activity Standing balance-Leahy Scale: Good           Rhomberg - Eyes Opened: 30 Rhomberg - Eyes Closed: 12 High level balance activites: Direction changes;Turns;Sudden stops;Head turns High Level Balance Comments: No loss of balance during high level activities             Pertinent Vitals/Pain Pain Assessment: No/denies pain    Home Living Family/patient expects to be discharged to:: Private residence Living Arrangements: Alone Available Help at Discharge: Family;Available PRN/intermittently Type of Home: House Home Access: Ramped entrance     Home Layout: One level Home Equipment: Cane - single point;Shower seat;Grab bars - tub/shower      Prior Function Level of Independence: Independent         Comments: drives, volunteers 3x per week at [facility]     Hand Dominance   Dominant Hand: Right    Extremity/Trunk Assessment   Upper Extremity Assessment Upper Extremity Assessment: Defer to OT evaluation    Lower Extremity Assessment Lower Extremity Assessment:  Overall WFL for tasks assessed (LLE at 4+/5;  RLE at 5/5)       Communication   Communication: HOH  Cognition Arousal/Alertness: Awake/alert Behavior During Therapy: WFL for tasks assessed/performed Overall  Cognitive Status: Within Functional Limits for tasks assessed                                        General Comments      Exercises Other Exercises Other Exercises: Shallow squats with feet even - 10 reps while holding onto steady object Other Exercises: Shallow squats with  LLE posterior - 5 reps while holding onto steady object   Assessment/Plan    PT Assessment Patent does not need any further PT services  PT Problem List         PT Treatment Interventions      PT Goals (Current goals can be found in the Care Plan section)  Acute Rehab PT Goals Patient Stated Goal: go home PT Goal Formulation: All assessment and education complete, DC therapy    Frequency     Barriers to discharge        Co-evaluation               AM-PAC PT "6 Clicks" Daily Activity  Outcome Measure Difficulty turning over in bed (including adjusting bedclothes, sheets and blankets)?: None Difficulty moving from lying on back to sitting on the side of the bed? : A Little Difficulty sitting down on and standing up from a chair with arms (e.g., wheelchair, bedside commode, etc,.)?: None Help needed moving to and from a bed to chair (including a wheelchair)?: None Help needed walking in hospital room?: None Help needed climbing 3-5 steps with a railing? : A Little 6 Click Score: 22    End of Session   Activity Tolerance: Patient tolerated treatment well Patient left: in bed;with call bell/phone within reach;with family/visitor present Nurse Communication: Mobility status (No PT needs) PT Visit Diagnosis: Muscle weakness (generalized) (M62.81);Difficulty in walking, not elsewhere classified (R26.2)    Time: 3295-1884 PT Time Calculation (min) (ACUTE ONLY): 24 min   Charges:   PT Evaluation $PT Eval Moderate Complexity: 1 Procedure PT Treatments $Gait Training: 8-22 mins   PT G Codes:        Carita Pian. Sanjuana Kava, [facility] Acute Rehab Services Pager [place] 06/23/2016, 8:40 PM

## 2016-06-23 NOTE — ED Notes (Signed)
Attempted report at this time.  Nurse to call back when available. 

## 2016-06-23 NOTE — Evaluation (Signed)
Occupational Therapy Evaluation and Discharge  Patient Details Name: Stacy [name] MRN: [id number] DOB: 23-Aug-1926 Today's Date: 06/23/2016    History of Present Illness 83 y.o. female with medical history significant for hypertension, diet-controlled diabetes mellitus, chronic kidney disease stage II, coronary artery disease with stent, and carotid artery stenosis status post right CEA, now presenting to the emergency department for evaluation of left leg weakness and resulting fall. MRI on 5/13 showed small acute/early subacute infarction in the anterior corpus callosum and right frontal periventricular white matter.    Clinical Impression   Pt reports she was mod I-independent with ADL PTA; pt still drives and volunteers with cardiac rehab. Currently pt overall supervision for ADL and functional mobility. Pt feels she has mostly returned to baseline with slight residual LLE weakness. Pt and daughter report pt with hx of 2 falls recently, one directly PTA. Educated pt and daughter on signs and symptoms of CVA; they verbalized understanding. Pt planning to d/c home alone with intermittent family supervision. No further acute OT needs identified; signing off at this time. Please re-consult if needs change. Thank you for this referral.    Follow Up Recommendations  No OT follow up;Supervision - Intermittent    Equipment Recommendations  None recommended by OT    Recommendations for Other Services PT consult     Precautions / Restrictions Precautions Precautions: Fall Restrictions Weight Bearing Restrictions: No      Mobility Bed Mobility Overal bed mobility: Modified Independent             General bed mobility comments: HOB elevated with increased time. No physical assist  Transfers Overall transfer level: Needs assistance Equipment used: None Transfers: Sit to/from Stand Sit to Stand: Supervision         General transfer comment: for safety, no physical assist  needed    Balance Overall balance assessment: Needs assistance Sitting-balance support: Feet unsupported;No upper extremity supported Sitting balance-Leahy Scale: Normal     Standing balance support: No upper extremity supported;During functional activity Standing balance-Leahy Scale: Good                             ADL either performed or assessed with clinical judgement   ADL Overall ADL's : Needs assistance/impaired Eating/Feeding: Independent;Sitting   Grooming: Supervision/safety;Standing;Wash/dry hands   Upper Body Bathing: Sitting;Set up   Lower Body Bathing: Supervison/ safety;Sit to/from stand   Upper Body Dressing : Set up;Sitting   Lower Body Dressing: Supervision/safety;Sit to/from stand   Toilet Transfer: Supervision/safety;Ambulation;Regular Toilet   Toileting- Water quality scientist and Hygiene: Supervision/safety;Sit to/from stand       Functional mobility during ADLs: Supervision/safety General ADL Comments: Educated pt and daughter on signs and symptoms of CVA and importance of returning to hospital if experiencing sings/symptoms.     Vision Baseline Vision/History: Wears glasses Wears Glasses:  (for driving only) Patient Visual Report: No change from baseline Vision Assessment?: No apparent visual deficits     Perception     Praxis      Pertinent Vitals/Pain Pain Assessment: No/denies pain     Hand Dominance Right   Extremity/Trunk Assessment Upper Extremity Assessment Upper Extremity Assessment: Overall WFL for tasks assessed   Lower Extremity Assessment Lower Extremity Assessment: Defer to PT evaluation   Cervical / Trunk Assessment Cervical / Trunk Assessment: Normal   Communication Communication Communication: HOH   Cognition Arousal/Alertness: Awake/alert Behavior During Therapy: WFL for tasks assessed/performed Overall Cognitive Status: Within Functional  Limits for tasks assessed                                      General Comments       Exercises     Shoulder Instructions      Home Living Family/patient expects to be discharged to:: Private residence Living Arrangements: Alone Available Help at Discharge: Family;Available PRN/intermittently Type of Home: House Home Access: Ramped entrance     Home Layout: One level     Bathroom Shower/Tub: Occupational psychologist: Standard     Home Equipment: Cane - single point;Shower seat;Grab bars - tub/shower   Additional Comments: pt reports hx of 2 falls recently; one with stroke symptoms directly PTA.      Prior Functioning/Environment Level of Independence: Independent        Comments: drives, volunteers 3x per week at [facility]        OT Problem List:        OT Treatment/Interventions:      OT Goals(Current goals can be found in the care plan section) Acute Rehab OT Goals Patient Stated Goal: get back home and be independent OT Goal Formulation: All assessment and education complete, DC therapy  OT Frequency:     Barriers to D/C:            Co-evaluation              AM-PAC PT "6 Clicks" Daily Activity     Outcome Measure Help from another person eating meals?: None Help from another person taking care of personal grooming?: None Help from another person toileting, which includes using toliet, bedpan, or urinal?: A Little Help from another person bathing (including washing, rinsing, drying)?: A Little Help from another person to put on and taking off regular upper body clothing?: None Help from another person to put on and taking off regular lower body clothing?: A Little 6 Click Score: 21   End of Session Nurse Communication: Mobility status  Activity Tolerance: Patient tolerated treatment well Patient left: in chair;with call bell/phone within reach;with family/visitor present  OT Visit Diagnosis: Unsteadiness on feet (R26.81);History of falling (Z91.81);Muscle  weakness (generalized) (M62.81)                Time: 0973-5329 OT Time Calculation (min): 22 min Charges:  OT General Charges $OT Visit: 1 Procedure OT Evaluation $OT Eval Moderate Complexity: 1 Procedure G-Codes:     Myking Sar A. Ulice Brilliant, M.S., OTR/L Pager: [place] 06/23/2016, 8:57 AM

## 2016-06-23 NOTE — Progress Notes (Signed)
STROKE TEAM PROGRESS NOTE   HISTORY OF PRESENT ILLNESS (per record) Stacy [name] is a 83 y.o. female with a history of right ICA stenosis who presents with left leg weakness. She states that it  Has come and gone over the course of the evening, first starting around 6:30 PM. She states that most recently, it happened even while she was here after she got up to go the bathroom. Does not present when laying down.   LKW: 6:30 PM tpa given?: no, symptoms resolved, mild symptoms   SUBJECTIVE (INTERVAL HISTORY) Her  daughter is at the bedside.  She states her leg weakness is significantly improved but not back to baseline yet.   OBJECTIVE Temp:  [97.7 F (36.5 C)-98.1 F (36.7 C)] 98.1 F (36.7 C) (05/13 1200) Pulse Rate:  [52-65] 65 (05/13 1200) Cardiac Rhythm: Sinus bradycardia;Bundle branch block (05/13 1200) Resp:  [11-25] 18 (05/13 1200) BP: (106-137)/(38-92) 111/49 (05/13 1200) SpO2:  [94 %-99 %] 96 % (05/13 1200) Weight:  [67.6 kg (149 lb)-68.6 kg (151 lb 3.2 oz)] 68.6 kg (151 lb 3.2 oz) (05/13 0542)  CBC:   Recent Labs Lab 06/22/16 2345  WBC 8.8  NEUTROABS 4.3  HGB 13.4  HCT 41.3  MCV 89.6  PLT 195    Basic Metabolic Panel:   Recent Labs Lab 06/22/16 2345  NA 138  K 3.5  CL 104  CO2 24  GLUCOSE 121*  BUN 18  CREATININE 1.14*  CALCIUM 8.9    Lipid Panel:     Component Value Date/Time   CHOL 102 06/23/2016 0441   TRIG 116 06/23/2016 0441   HDL 42 06/23/2016 0441   CHOLHDL 2.4 06/23/2016 0441   VLDL 23 06/23/2016 0441   LDLCALC 37 06/23/2016 0441   HgbA1c:  Lab Results  Component Value Date   HGBA1C 7.5 (H) 01/24/2012   Urine Drug Screen: No results found for: LABOPIA, COCAINSCRNUR, LABBENZ, AMPHETMU, THCU, LABBARB  Alcohol Level No results found for: [facility]  IMAGING  Mr Jodene Nam Head Wo Contrast 06/23/2016 Patent circle of Willis. No large vessel occlusion, aneurysm, or significant stenosis is identified.     Mr Brain Wo Contrast 06/23/2016 1.  Small acute/early subacute infarction centered in the anterior body of corpus callosum extending into right frontal periventricular white matter. No acute hemorrhage identified.  2. Mild progression of chronic microvascular ischemic changes and parenchymal volume loss of the brain from 2008.  3. Trace right mastoid effusion.     PHYSICAL EXAM Pleasant elderly Caucasian lady not in distress. . Afebrile. Head is nontraumatic. Neck is supple without bruit.    Cardiac exam no murmur or gallop. Lungs are clear to auscultation. Distal pulses are well felt.  Neurological Exam :  Awake alert oriented x3. Normal speech and language. Vision acuity and fields appear normal. Fundi not visualized. Face is symmetric. Tongue midline. Normal strength in upper extremities bilaterally. Mild weakness of the left hip flexors and ankle dorsiflexors only. Reflexes are symmetric. Plantars are downgoing. Touch pinprick sensation are preser bilaterally. Coordination is accurate. Gait was not tested.   ASSESSMENT/PLAN [name] is a 83 y.o. female with history of coronary artery disease with MI, hypertension, hyperlipidemia, diabetes mellitus, carotid artery disease, athero embolism with blue toe syndrome, and anxiety presenting with left leg weakness. She did not receive IV t-PA due to minimal transient deficits.  Stroke: Small infarction centered in the anterior body of corpus callosum - possibly embolic - source unknown.  Resultant  Mild  left leg monoparesis  MRI - Small acute/early subacute infarction centered in the anterior body of corpus callosum   MRA - unremarkable  Carotid Doppler - 1-39% internal carotid artery stenosis bilaterally. Vertebral arteries are patent with antegrade flow.  2D Echo - pending  Bilateral LE duplex - negative for DVT  LDL - 37  HgbA1c pending  VTE prophylaxis - Lovenox Diet Heart Room service appropriate? Yes; Fluid consistency: Thin Diet NPO time  specified  aspirin 81 mg daily and clopidogrel 75 mg daily prior to admission, now on aspirin 81 mg daily and clopidogrel 75 mg daily  Patient counseled to be compliant with her antithrombotic medications  Ongoing aggressive stroke risk factor management  Therapy recommendations:  No OT follow-up recommended. PT evaluation pending  Disposition:  Pending  Hypertension  Stable  Permissive hypertension (OK if < 220/120) but gradually normalize in 5-7 days  Long-term BP goal normotensive  Hyperlipidemia  Home meds:  Crestor 10 mg daily resumed in hospital  LDL 37, goal < 70  Continue statin at discharge  Diabetes  HgbA1c pending, goal < 7.0  Unc / Controlled  Other Stroke Risk Factors  Advanced age  The patient quit smoking 17 years ago  Overweight, Body mass index is 25.95 kg/m., recommend weight loss, diet and exercise as appropriate   Family hx stroke (paternal uncle)  Coronary artery disease   Other Active Problems  Creatinine - 1.14   PLAN  TEE and possible loop tomorrow - cardiology notified. NPO order written.  Hospital day # 0  I have personally examined this patient, reviewed notes, independently viewed imaging studies, participated in medical decision making and plan of care.ROS completed by me personally and pertinent positives fully documented  I have made any additions or clarifications directly to the above note. She presented with left leg weakness do you to embolic right anterior cerebral artery infarct and needs ongoing stroke evaluation. Continue aspirin and Plavix given the recent cardiac stents. Check TEE and loop recorder  . Discussed with patient and daughter and uncertain questions. Greater than 50% time during this 35 minute visit to was spent on counseling and coordination of care about her embolic strokes and answering questions.  Antony Contras, MD Medical Director [place] Pager: [phone] 06/23/2016 5:02  PM   To contact Stroke Continuity provider, please refer to [email or web address] After hours, contact General Neurology

## 2016-06-24 ENCOUNTER — Encounter (HOSPITAL_COMMUNITY)
Admission: EM | Disposition: A | Discharge status: Home / Self Care | Payer: Self-pay | Source: Home / Self Care | Attending: Internal Medicine

## 2016-06-24 ENCOUNTER — Inpatient Hospital Stay (HOSPITAL_COMMUNITY): Payer: Medicare Other

## 2016-06-24 ENCOUNTER — Encounter (HOSPITAL_COMMUNITY): Payer: Self-pay

## 2016-06-24 ENCOUNTER — Telehealth: Payer: Self-pay | Admitting: Cardiovascular Disease

## 2016-06-24 DIAGNOSIS — I340 Nonrheumatic mitral (valve) insufficiency: Secondary | ICD-10-CM

## 2016-06-24 DIAGNOSIS — I639 Cerebral infarction, unspecified: Secondary | ICD-10-CM

## 2016-06-24 DIAGNOSIS — I255 Ischemic cardiomyopathy: Secondary | ICD-10-CM

## 2016-06-24 HISTORY — PX: TEE WITHOUT CARDIOVERSION: SHX5443

## 2016-06-24 HISTORY — PX: LOOP RECORDER INSERTION: EP1214

## 2016-06-24 LAB — CBC
HCT: 39.9 % (ref 36.0–46.0)
Hemoglobin: 12.9 g/dL (ref 12.0–15.0)
MCH: 29 pg (ref 26.0–34.0)
MCHC: 32.3 g/dL (ref 30.0–36.0)
MCV: 89.7 fL (ref 78.0–100.0)
PLATELETS: 169 10*3/uL (ref 150–400)
RBC: 4.45 MIL/uL (ref 3.87–5.11)
RDW: 14.3 % (ref 11.5–15.5)
WBC: 7.1 10*3/uL (ref 4.0–10.5)

## 2016-06-24 LAB — VAS US CAROTID
LCCADDIAS: 11 cm/s
LCCADSYS: 74 cm/s
LCCAPDIAS: 16 cm/s
LCCAPSYS: 74 cm/s
LEFT ECA DIAS: -9 cm/s
LEFT VERTEBRAL DIAS: 8 cm/s
LICADDIAS: -26 cm/s
Left ICA dist sys: -83 cm/s
Left ICA prox dias: -16 cm/s
Left ICA prox sys: -61 cm/s
RCCAPDIAS: 12 cm/s
RCCAPSYS: 57 cm/s
RIGHT ECA DIAS: -9 cm/s
RIGHT VERTEBRAL DIAS: 16 cm/s
Right cca dist sys: -78 cm/s

## 2016-06-24 LAB — HEMOGLOBIN A1C
Hgb A1c MFr Bld: 7.2 % — ABNORMAL HIGH (ref 4.8–5.6)
Mean Plasma Glucose: 160 mg/dL

## 2016-06-24 LAB — COMPREHENSIVE METABOLIC PANEL
ALBUMIN: 3.2 g/dL — AB (ref 3.5–5.0)
ALK PHOS: 53 U/L (ref 38–126)
ALT: 26 U/L (ref 14–54)
ANION GAP: 7 (ref 5–15)
AST: 27 U/L (ref 15–41)
BUN: 15 mg/dL (ref 6–20)
CALCIUM: 8.6 mg/dL — AB (ref 8.9–10.3)
CHLORIDE: 108 mmol/L (ref 101–111)
CO2: 29 mmol/L (ref 22–32)
CREATININE: 1.01 mg/dL — AB (ref 0.44–1.00)
GFR, EST AFRICAN AMERICAN: 56 mL/min — AB (ref >60)
GFR, EST NON AFRICAN AMERICAN: 48 mL/min — AB (ref >60)
Glucose, Bld: 128 mg/dL — ABNORMAL HIGH (ref 65–99)
Potassium: 4.3 mmol/L (ref 3.5–5.1)
SODIUM: 144 mmol/L (ref 135–145)
Total Bilirubin: 0.6 mg/dL (ref 0.3–1.2)
Total Protein: 5.6 g/dL — ABNORMAL LOW (ref 6.5–8.1)

## 2016-06-24 SURGERY — ECHOCARDIOGRAM, TRANSESOPHAGEAL
Anesthesia: Moderate Sedation

## 2016-06-24 SURGERY — LOOP RECORDER INSERTION

## 2016-06-24 MED ORDER — BUTAMBEN-TETRACAINE-BENZOCAINE 2-2-14 % EX AERO
INHALATION_SPRAY | CUTANEOUS | Status: DC | PRN
  Administered 2016-06-24: 2 via TOPICAL

## 2016-06-24 MED ORDER — MIDAZOLAM HCL 5 MG/ML IJ SOLN
INTRAMUSCULAR | Status: AC
  Filled 2016-06-24: qty 1

## 2016-06-24 MED ORDER — FENTANYL CITRATE (PF) 100 MCG/2ML IJ SOLN
INTRAMUSCULAR | Status: DC | PRN
  Administered 2016-06-24 (×2): 12.5 ug via INTRAVENOUS

## 2016-06-24 MED ORDER — MIDAZOLAM HCL 10 MG/2ML IJ SOLN
INTRAMUSCULAR | Status: DC | PRN
  Administered 2016-06-24 (×2): 1 mg via INTRAVENOUS

## 2016-06-24 MED ORDER — RAMIPRIL 2.5 MG PO CAPS: ORAL_CAPSULE | ORAL | 3 refills | Status: DC

## 2016-06-24 MED ORDER — PERFLUTREN LIPID MICROSPHERE
INTRAVENOUS | Status: AC
  Filled 2016-06-24: qty 10

## 2016-06-24 MED ORDER — LIDOCAINE-EPINEPHRINE 1 %-1:100000 IJ SOLN
INTRAMUSCULAR | Status: AC
  Filled 2016-06-24: qty 1

## 2016-06-24 MED ORDER — FENTANYL CITRATE (PF) 100 MCG/2ML IJ SOLN
INTRAMUSCULAR | Status: AC
  Filled 2016-06-24: qty 2

## 2016-06-24 MED ORDER — HYDROCHLOROTHIAZIDE 12.5 MG PO CAPS
12.5000 mg | ORAL_CAPSULE | Freq: Every day | ORAL | 3 refills | Status: DC
Start: 2016-07-01 — End: 2016-12-06

## 2016-06-24 SURGICAL SUPPLY — 2 items
LOOP REVEAL LINQSYS (Prosthesis & Implant Heart) ×2 IMPLANT
PACK LOOP INSERTION (CUSTOM PROCEDURE TRAY) ×3 IMPLANT

## 2016-06-24 NOTE — Progress Notes (Signed)
  Echocardiogram Echocardiogram Transesophageal has been performed.  Stacy [name] 06/24/2016, 1:05 PM

## 2016-06-24 NOTE — Telephone Encounter (Signed)
Thanks for letting me know!

## 2016-06-24 NOTE — Discharge Instructions (Signed)
Keep incision clean and dry for 5 days.  You can remove outer dressing tomorrow. Leave steri-strips (little pieces of tape) on until seen in the office for wound check appointment. Call the office [phone]) for redness, drainage, swelling, or fever.

## 2016-06-24 NOTE — Discharge Summary (Signed)
Physician Discharge Summary  Stacy [name] MRN: [id number] DOB/AGE: [birth year] 83 y.o.  PCP: Lillard Anes, MD   Admit date: 06/22/2016 Discharge date: 06/24/2016  Discharge Diagnoses:    Principal Problem:   Acute ischemic stroke [facility]) Active Problems:   Essential hypertension   Coronary atherosclerosis   PEPTIC ULCER DISEASE, HX OF   Carotid artery stenosis   CKD (chronic kidney disease), stage II    Follow-up recommendations Follow-up with PCP in 3-5 days , including all  additional recommended appointments as below Follow-up CBC, CMP in 3-5 days Follow-up with neurology Garvin Fila, MD has recommended Patient to follow-up with Dr. Burt Knack her regular cardiologist as scheduled, he is aware of patient's admission    Current Discharge Medication List    CONTINUE these medications which have CHANGED   Details  hydrochlorothiazide (MICROZIDE) 12.5 MG capsule Take 1 capsule (12.5 mg total) by mouth daily. Qty: 90 capsule, Refills: 3   Associated Diagnoses: Coronary artery disease involving native coronary artery of native heart without angina pectoris    ramipril (ALTACE) 2.5 MG capsule TAKE 1 CAPSULE (2.5 MG TOTAL) BY MOUTH 2 (TWO) TIMES DAILY. Qty: 180 capsule, Refills: 3   Associated Diagnoses: Coronary artery disease involving native coronary artery of native heart without angina pectoris      CONTINUE these medications which have NOT CHANGED   Details  ACCU-CHEK SMARTVIEW test strip 1 each by Other route as needed for other (blood sugar test strips).  Refills: 11    aspirin 81 MG tablet Take 1 tablet (81 mg total) by mouth daily. Qty: 1 tablet   Associated Diagnoses: Coronary atherosclerosis of unspecified type of vessel, native or graft    cetirizine (ZYRTEC) 10 MG tablet Take 10 mg by mouth daily as needed for allergies.    cholecalciferol (VITAMIN D) 400 UNITS TABS Take 400 Units by mouth daily.     clopidogrel (PLAVIX) 75 MG tablet Take 1 tablet  (75 mg total) by mouth daily. Qty: 90 tablet, Refills: 3   Associated Diagnoses: Coronary artery disease involving native coronary artery of native heart without angina pectoris    Coenzyme Q10 (COQ-10 PO) Take 50 mg by mouth daily. 1 tab po qd    conjugated estrogens (PREMARIN) vaginal cream Place 1 g vaginally once a week. .625 mg once weekly    gabapentin (NEURONTIN) 100 MG capsule Take 100 mg by mouth 2 (two) times daily.     hydrocortisone 2.5 % cream Apply 1 application topically daily as needed (for itching).  Refills: 1    Melatonin 5 MG CAPS Take 1 capsule by mouth at bedtime as needed (sleep).     menthol-zinc oxide (GOLD BOND) powder Apply 1 application topically 2 (two) times daily as needed (freshness.).    metoprolol succinate (TOPROL-XL) 25 MG 24 hr tablet Take 1 tablet (25 mg total) by mouth daily. Qty: 90 tablet, Refills: 3   Associated Diagnoses: Coronary artery disease involving native coronary artery of native heart without angina pectoris    NEOMYCIN-POLYMYXIN-HYDROCORTISONE (CORTISPORIN) 1 % SOLN otic solution Place 2 drops into both ears as directed.  Refills: 3    rosuvastatin (CRESTOR) 10 MG tablet Take 1 tablet (10 mg total) by mouth daily. Qty: 90 tablet, Refills: 3   Associated Diagnoses: Hyperlipidemia; Coronary artery disease involving native coronary artery of native heart without angina pectoris    sertraline (ZOLOFT) 25 MG tablet Take 25 mg by mouth daily.     acetaminophen (TYLENOL) 325 MG tablet  Take 325 mg by mouth every 6 (six) hours as needed for mild pain or moderate pain.    albuterol (PROVENTIL HFA;VENTOLIN HFA) 108 (90 Base) MCG/ACT inhaler Inhale 1-2 puffs into the lungs every 6 (six) hours as needed for wheezing or shortness of breath. Qty: 1 Inhaler, Refills: 0    benzonatate (TESSALON) 100 MG capsule Take 100 mg by mouth 3 (three) times daily as needed for cough. Refills: 2    Calcium Carbonate Antacid (TUMS PO) Take 1 Dose by mouth  as directed.     dexlansoprazole (DEXILANT) 60 MG capsule Take 60 mg by mouth daily as needed (for acid reflux). Take 1 tablet ( Dexilant DR ) by mouth once daily    diphenhydrAMINE (BENADRYL) 25 mg capsule Take 25 mg by mouth as needed for allergies.     nitroGLYCERIN (NITROSTAT) 0.4 MG SL tablet Place 1 tablet (0.4 mg total) under the tongue every 5 (five) minutes as needed for chest pain. Qty: 25 tablet, Refills: 2    Polyethylene Glycol 400 (BLINK TEARS OP) Place 1 drop into both eyes daily as needed (for dry eye). Reported on 05/05/2015    polyethylene glycol powder (GLYCOLAX/MIRALAX) powder Take 17 g by mouth daily. Reported on 06/12/2015    Spacer/Aero-Holding Chambers (AEROCHAMBER PLUS WITH MASK) inhaler Use as instructed Qty: 1 each, Refills: 2    trolamine salicylate (ASPERCREME) 10 % cream Apply 1 application topically as needed for muscle pain.          Discharge Condition: Stable   Discharge Instructions Get Medicines reviewed and adjusted: Please take all your medications with you for your next visit with your Primary MD  Please request your Primary MD to go over all hospital tests and procedure/radiological results at the follow up, please ask your Primary MD to get all Hospital records sent to his/her office.  If you experience worsening of your admission symptoms, develop shortness of breath, life threatening emergency, suicidal or homicidal thoughts you must seek medical attention immediately by calling 911 or calling your MD immediately if symptoms less severe.  You must read complete instructions/literature along with all the possible adverse reactions/side effects for all the Medicines you take and that have been prescribed to you. Take any new Medicines after you have completely understood and accpet all the possible adverse reactions/side effects.   Do not drive when taking Pain medications.   Do not take more than prescribed Pain, Sleep and Anxiety  Medications  Special Instructions: If you have smoked or chewed Tobacco in the last 2 yrs please stop smoking, stop any regular Alcohol and or any Recreational drug use.  Wear Seat belts while driving.  Please note  You were cared for by a hospitalist during your hospital stay. Once you are discharged, your primary care physician will handle any further medical issues. Please note that NO REFILLS for any discharge medications will be authorized once you are discharged, as it is imperative that you return to your primary care physician (or establish a relationship with a primary care physician if you do not have one) for your aftercare needs so that they can reassess your need for medications and monitor your lab values.     Allergies  Allergen Reactions  . Iohexol Other (See Comments)     Desc: HIVES- 13 HR PRE-MEDS ARE REQUIRED-ASM- 03/21/05,SULFA,ADHESIVE TAPE   . Sulfonamide Derivatives Hives    REACTION: hives  . Latex Other (See Comments)    Adhesive tape and EKG adhesive leads  to rash  . Tranilast Hives    Investigational medication      Disposition: 01-Home or Self Care   Consults:  Neurology Cardiology     Significant Diagnostic Studies:  Mr Virgel Paling SE Contrast  Result Date: 06/23/2016 CLINICAL DATA:  83 y/o  F; left leg weakness. EXAM: MRA HEAD WITHOUT CONTRAST TECHNIQUE: Angiographic images of the Circle of Willis were obtained using MRA technique without intravenous contrast. COMPARISON:  06/23/2016 MRI of the head. FINDINGS: Anterior circulation: No large vessel occlusion, aneurysm, or significant stenosis is identified. Posterior circulation: No large vessel occlusion, aneurysm, or significant stenosis is identified. Anatomic variant: Patent anterior and left posterior communicating arteries. No right posterior communicating artery identified, likely hypoplastic or absent. IMPRESSION: Patent circle of Willis. No large vessel occlusion, aneurysm, or significant  stenosis is identified. Electronically Signed   By: Kristine Garbe M.D.   On: 06/23/2016 05:53   Mr Brain Wo Contrast  Result Date: 06/23/2016 CLINICAL DATA:  83 y/o  F; left leg weakness. EXAM: MRI HEAD WITHOUT CONTRAST TECHNIQUE: Multiplanar, multiecho pulse sequences of the brain and surrounding structures were obtained without intravenous contrast. COMPARISON:  04/15/2006 MRI of the brain. FINDINGS: Brain: Small focus of reduced diffusion in the right anterior body of corpus callosum extending into right frontal periventricular white matter (series 400, image 34) compatible with acute/early subacute infarction. No evidence for acute hemorrhage. There are multiple scattered foci of T2 FLAIR hyperintense signal abnormality in subcortical and periventricular white matter that are nonspecific but compatible with mild chronic microvascular ischemic changes for age and mildly progressed from 2008. Additionally there is mild progression of parenchymal volume loss. No hydrocephalus or extra-axial collection. No focal mass effect. Vascular: Normal flow voids. Skull and upper cervical spine: Normal marrow signal. Sinuses/Orbits: Trace right mastoid effusion. Mild ethmoid sinus mucosal thickening. Other: None. IMPRESSION: 1. Small acute/early subacute infarction centered in the anterior body of corpus callosum extending into right frontal periventricular white matter. No acute hemorrhage identified. 2. Mild progression of chronic microvascular ischemic changes and parenchymal volume loss of the brain from 2008. 3. Trace right mastoid effusion. These results will be called to the ordering clinician or representative by the Radiologist Assistant, and communication documented in the PACS or zVision Dashboard. Electronically Signed   By: Kristine Garbe M.D.   On: 06/23/2016 03:24   Dg Chest Port 1 View  Result Date: 06/23/2016 CLINICAL DATA:  Stroke, left leg weakness yesterday. History of MI,  diabetes and hypertension. Former smoker. EXAM: PORTABLE CHEST 1 VIEW COMPARISON:  Chest x-rays dated 03/02/2016 and 04/15/2006. FINDINGS: Heart size is upper normal, stable. Overall cardiomediastinal silhouette is stable. Atherosclerotic changes noted at the aortic arch. Lungs are clear. Lungs are hyperexpanded. No acute or suspicious osseous finding. IMPRESSION: 1. No active disease.  No evidence of pneumonia or pulmonary edema. 2. Hyperexpanded lungs suggesting COPD. 3. Aortic atherosclerosis. Electronically Signed   By: Franki Cabot M.D.   On: 06/23/2016 15:14    echocardiogram  LV EF: 40% -   45%  ------------------------------------------------------------------- Indications:      CVA 436.  ------------------------------------------------------------------- History:   Risk factors:  Chronic kidney disease. Hypertension.  ------------------------------------------------------------------- Study Conclusions  - Left ventricle: There is a large apical and distal anterior   akinetic area. No definate thrombus is seen but if embolus   suspected may repeat with Definity. Virgie Dad is also a   consideration. The cavity size was normal. Wall thickness was   increased in a pattern of  mild LVH. Systolic function was mildly   to moderately reduced. The estimated ejection fraction was in the   range of 40% to 45%. Akinesis of the mid-apicalanterior and   apical myocardium. - Mitral valve: There was moderate regurgitation directed   posteriorly and toward the free wall. - Left atrium: The atrium was mildly dilated. - Tricuspid valve: There was moderate regurgitation. - Pulmonary arteries: Systolic pressure was mildly increased. PA   peak pressure: 40 mm Hg (S).   TEE - Left ventricle: There is akinesis in the apical anterior and septal walls, no thrombus was seen with Definity echocontrast.  LVEF 45-50%.  Mitral valve: There was moderate regurgitation. Left atrium: The atrium was  mildly dilated. Tricuspid valve: There was moderate regurgitation.RVSP 39 mmHg.  No PFO by color Doppler or bubble study.   Filed Weights   06/22/16 2314 06/23/16 0542  Weight: 67.6 kg (149 lb) 68.6 kg (151 lb 3.2 oz)     Microbiology: No results found for this or any previous visit (from the past 240 hour(s)).     Blood Culture    Component Value Date/Time   SDES URINE, CLEAN CATCH 12/28/2011 1625   SPECREQUEST NONE 12/28/2011 1625   CULT ESCHERICHIA COLI 12/28/2011 1625   REPTSTATUS 12/30/2011 FINAL 12/28/2011 1625      Labs: Results for orders placed or performed during the hospital encounter of 06/22/16 (from the past 48 hour(s))  CBC with Differential/Platelet     Status: None   Collection Time: 06/22/16 11:45 PM  Result Value Ref Range   WBC 8.8 4.0 - 10.5 K/uL   RBC 4.61 3.87 - 5.11 MIL/uL   Hemoglobin 13.4 12.0 - 15.0 g/dL   HCT 41.3 36.0 - 46.0 %   MCV 89.6 78.0 - 100.0 fL   MCH 29.1 26.0 - 34.0 pg   MCHC 32.4 30.0 - 36.0 g/dL   RDW 14.2 11.5 - 15.5 %   Platelets 179 150 - 400 K/uL   Neutrophils Relative % 50 %   Neutro Abs 4.3 1.7 - 7.7 K/uL   Lymphocytes Relative 36 %   Lymphs Abs 3.2 0.7 - 4.0 K/uL   Monocytes Relative 11 %   Monocytes Absolute 1.0 0.1 - 1.0 K/uL   Eosinophils Relative 3 %   Eosinophils Absolute 0.3 0.0 - 0.7 K/uL   Basophils Relative 0 %   Basophils Absolute 0.0 0.0 - 0.1 K/uL  Basic metabolic panel     Status: Abnormal   Collection Time: 06/22/16 11:45 PM  Result Value Ref Range   Sodium 138 135 - 145 mmol/L   Potassium 3.5 3.5 - 5.1 mmol/L   Chloride 104 101 - 111 mmol/L   CO2 24 22 - 32 mmol/L   Glucose, Bld 121 (H) 65 - 99 mg/dL   BUN 18 6 - 20 mg/dL   Creatinine, Ser 1.14 (H) 0.44 - 1.00 mg/dL   Calcium 8.9 8.9 - 10.3 mg/dL   GFR calc non Af Amer 42 (L) >60 mL/min   GFR calc Af Amer 48 (L) >60 mL/min    Comment: (NOTE) The eGFR has been calculated using the CKD EPI equation. This calculation has not been validated in  all clinical situations. eGFR's persistently <60 mL/min signify possible Chronic Kidney Disease.    Anion gap 10 5 - 15  Lipid panel     Status: None   Collection Time: 06/23/16  4:41 AM  Result Value Ref Range   Cholesterol 102 0 - 200 mg/dL  Triglycerides 116 <150 mg/dL   HDL 42 >40 mg/dL   Total CHOL/HDL Ratio 2.4 RATIO   VLDL 23 0 - 40 mg/dL   LDL Cholesterol 37 0 - 99 mg/dL    Comment:        Total Cholesterol/HDL:CHD Risk Coronary Heart Disease Risk Table                     Men   Women  1/2 Average Risk   3.4   3.3  Average Risk       5.0   4.4  2 X Average Risk   9.6   7.1  3 X Average Risk  23.4   11.0        Use the calculated Patient Ratio above and the CHD Risk Table to determine the patient's CHD Risk.        ATP III CLASSIFICATION (LDL):  <100     mg/dL   Optimal  100-129  mg/dL   Near or Above                    Optimal  130-159  mg/dL   Borderline  160-189  mg/dL   High  >190     mg/dL   Very High   CBC     Status: None   Collection Time: 06/24/16  2:15 AM  Result Value Ref Range   WBC 7.1 4.0 - 10.5 K/uL   RBC 4.45 3.87 - 5.11 MIL/uL   Hemoglobin 12.9 12.0 - 15.0 g/dL   HCT 39.9 36.0 - 46.0 %   MCV 89.7 78.0 - 100.0 fL   MCH 29.0 26.0 - 34.0 pg   MCHC 32.3 30.0 - 36.0 g/dL   RDW 14.3 11.5 - 15.5 %   Platelets 169 150 - 400 K/uL  Comprehensive metabolic panel     Status: Abnormal   Collection Time: 06/24/16  2:15 AM  Result Value Ref Range   Sodium 144 135 - 145 mmol/L   Potassium 4.3 3.5 - 5.1 mmol/L    Comment: DELTA CHECK NOTED   Chloride 108 101 - 111 mmol/L   CO2 29 22 - 32 mmol/L   Glucose, Bld 128 (H) 65 - 99 mg/dL   BUN 15 6 - 20 mg/dL   Creatinine, Ser 1.01 (H) 0.44 - 1.00 mg/dL   Calcium 8.6 (L) 8.9 - 10.3 mg/dL   Total Protein 5.6 (L) 6.5 - 8.1 g/dL   Albumin 3.2 (L) 3.5 - 5.0 g/dL   AST 27 15 - 41 U/L   ALT 26 14 - 54 U/L   Alkaline Phosphatase 53 38 - 126 U/L   Total Bilirubin 0.6 0.3 - 1.2 mg/dL   GFR calc non Af Amer 48  (L) >60 mL/min   GFR calc Af Amer 56 (L) >60 mL/min    Comment: (NOTE) The eGFR has been calculated using the CKD EPI equation. This calculation has not been validated in all clinical situations. eGFR's persistently <60 mL/min signify possible Chronic Kidney Disease.    Anion gap 7 5 - 15     Lipid Panel     Component Value Date/Time   CHOL 102 06/23/2016 0441   TRIG 116 06/23/2016 0441   HDL 42 06/23/2016 0441   CHOLHDL 2.4 06/23/2016 0441   VLDL 23 06/23/2016 0441   LDLCALC 37 06/23/2016 0441     Lab Results  Component Value Date   HGBA1C 7.5 (H) 01/24/2012  HPI :  Stacy [name] is a 83 y.o. female with medical history significant for hypertension, diet-controlled diabetes mellitus, chronic kidney disease stage II, coronary artery disease with stent, and carotid artery stenosis status post right CEA, now presenting to the emergency department for evaluation of left leg weakness and resulting fall. Patient reports that she had been in her usual state of health until the afternoon of 06/22/2016 when she developed a transient weakness involving her left leg. This seemed to resolve and she suspected that it was secondary to her degenerative lumbar disease as she has experienced similar symptoms involving the right leg which has been attributed to sciatica. She had a recurrence in left leg weakness overnight, describing that the "leg gave out from under me." This resulted in a fall onto her left side without head strike or loss of consciousness. There was no pain after the fall, but the leg was too weak for her to stand. EMS was called out to the house and she had to crawl to the door to let them in. She was later able to bear weight when helped up by the medics. She denies any recent headache, change in vision or hearing, or focal numbness. She denies chest pain or palpitations and denies fevers or chills.   ED Course: Upon arrival to the ED, patient is found to be afebrile,  saturating well on room air, and with vital signs stable. EKG features a sinus rhythm with chronic right bundle-branch block. Chemistry panels notable for a creatinine of 1.14, up from an apparent baseline of 0.9. CBC is unremarkable and urinalysis is also unremarkable. MRI brain  small acute/early subacute infarct in the anterior body of the corpus callosum, as well as mild progression of her microvascular ischemic changes. Neurology was consulted . admitted to the telemetry unit for ongoing evaluation and management of acute ischemic CVA.  HOSPITAL COURSE:  1. Stroke: Small infarction centered in the anterior body of corpus callosum - possibly embolic - source unknown.  Resultant  Mild left leg monoparesis  MRI - Small acute/early subacute infarction centered in the anterior body of corpus callosum   MRA - unremarkable  Carotid Doppler - 1-39% internal carotid artery stenosis bilaterally. Vertebral arteries are patent with antegrade flow.  2D Echo -results as above  TEE-negative  Bilateral LE duplex - negative for DVT  LDL - 37  HgbA1c 7.2  VTE prophylaxis - Lovenox  Diet Heart Room service appropriate? Yes; Fluid consistency: Thin  aspirin 81 mg daily and clopidogrel 75 mg daily prior to admission, now on aspirin 81 mg daily and clopidogrel 75 mg daily. Neurology recommends to Continue aspirin and Plavix given the recent cardiac stents  Therapy recommendations: None recommended   2. Carotid artery stenosis  - She underwent right CEA in 2017  - Carotid US ordered as above  - Continue ASA 81, Plavix, statin   3. Hypertension  - BP soft , hold HCTZ, ACE inhibitor, for 1 more week continue Toprol   Antihypertensives held on admission in light of acute ischemic CVA     4. CAD - No anginal complaints, EKG unchanged  - Underwent balloon angioplasty on LAD in 1997, RCA stented in 2000, and cath in 2008 demonstrated patency of these sites  - Continue Plavix, ASA 81, statin;  Toprol and ramipril held intitally as above   5. CKD stage II  - SCr is 1.14 on admission, slightly up from priors       Discharge Exam  Blood pressure (!) 114/59, pulse 69, temperature 97.9 F (36.6 C), temperature source Oral, resp. rate 18, height _0  (1.626 m), weight 68.6 kg (151 lb 3.2 oz), SpO2 96 %.  Cardiovascular: S1 & S2 heard, regular rate and rhythm. No significant JVD. Abdomen: No distension, no tenderness, no masses palpated. Bowel sounds normal.  Musculoskeletal: no clubbing / cyanosis. No joint deformity upper and lower extremities.   Skin: no significant rashes, lesions, ulcers. Warm, dry, well-perfused. Neurologic: CN 2-12 grossly intact. Sensation intact, DTR normal. Strength 5/5 in all 4 limbs.  Psychiatric: Alert and oriented x 3. Pleasant and cooperative.     Follow-up Information    Lillard Anes, MD. Call.   Specialty:  Family Medicine Why:  Hospital follow-up in 3-5 days Contact information: [address] [place] [address] [phone]        Garvin Fila, MD. Call.   Specialties:  Neurology, Radiology Why:  Stroke appointment, you need to see Dr Leonie Man in 4-6 weeks Contact information: [address] [place] [address] [phone]           Signed: Reyne Dumas 06/24/2016, 9:59 AM        Time spent >45 mins

## 2016-06-24 NOTE — H&P (View-Only) (Signed)
*  PRELIMINARY RESULTS* Vascular Ultrasound Carotid Duplex (Doppler) has been completed.  Findings suggest 1-39% internal carotid artery stenosis bilaterally. Vertebral arteries are patent with antegrade flow.   Bilateral lower extremity venous duplex completed. Bilateral lower extremities are negative for deep vein thrombosis. There is no evidence of Baker's cyst bilaterally.  06/23/2016 2:57 PM Maudry Mayhew, BS, RVT, RDCS, RDMS

## 2016-06-24 NOTE — Interval H&P Note (Signed)
History and Physical Interval Note:  06/24/2016 12:17 PM  Stacy [name]  has presented today for surgery, with the diagnosis of stroke  The various methods of treatment have been discussed with the patient and family. After consideration of risks, benefits and other options for treatment, the patient has consented to  Procedure(s): TRANSESOPHAGEAL ECHOCARDIOGRAM (TEE) (N/A) as a surgical intervention .  The patient's history has been reviewed, patient examined, no change in status, stable for surgery.  I have reviewed the patient's chart and labs.  Questions were answered to the patient's satisfaction.     [name]

## 2016-06-24 NOTE — Care Management Note (Signed)
Case Management Note  Patient Details  Name: Stacy [name] MRN: [id number] Date of Birth: 08/12/1926  Subjective/Objective:   Pt admitted with CVA. She is from home alone.                  Action/Plan: No f/u per PT/OT and no DME needs. Pt has insurance and a PCP. No further needs per CM.  Expected Discharge Date:  06/24/16               Expected Discharge Plan:  Home/Self Care  In-House Referral:     Discharge planning Services     Post Acute Care Choice:    Choice offered to:     DME Arranged:    DME Agency:     HH Arranged:    HH Agency:     Status of Service:  Completed, signed off  If discussed at [employer] of Stay Meetings, dates discussed:    Additional Comments:  Pollie Friar, RN 06/24/2016, 7:23 PM

## 2016-06-24 NOTE — CV Procedure (Addendum)
     Transesophageal Echocardiogram Note  Stacy [name] [id number] 05-May-1926  Procedure: Transesophageal Echocardiogram Indications: Stroke  Procedure Details Consent: Obtained Time Out: Verified patient identification, verified procedure, site/side was marked, verified correct patient position, special equipment/implants available, Radiology Safety Procedures followed,  medications/allergies/relevent history reviewed, required imaging and test results available.  Performed  During this procedure the patient is administered a total of Versed 4 mg and Fentanyl 50 mg to achieve and maintain moderate conscious sedation.  The patient's heart rate, blood pressure, and oxygen saturation are monitored continuously during the procedure. The period of conscious sedation is 30 minutes, of which I was present face-to-face 100% of this time.  - Left ventricle: There is akinesis in the apical anterior and septal walls, no thrombus was seen with Definity echocontrast.  LVEF 45-50%.  Mitral valve: There was moderate regurgitation. Left atrium: The atrium was mildly dilated. Tricuspid valve: There was moderate regurgitation.RVSP 39 mmHg.  No PFO by color Doppler or bubble study.   Complications: No apparent complications Patient did tolerate procedure well.  Stacy Dawley, MD, [facility] 06/24/2016, 12:30 PM

## 2016-06-24 NOTE — Telephone Encounter (Signed)
New message    Daughter Rodena Piety calling wants Dr. Burt Knack and nurse Ander Purpura aware mom admit to [facility] - small CVA.

## 2016-06-24 NOTE — Consult Note (Signed)
ELECTROPHYSIOLOGY CONSULT NOTE  Patient ID: LAMAYA HYNEMAN MRN: [id number], DOB/AGE: 81-29-1928   Admit date: 06/22/2016 Date of Consult: 06/24/2016  Primary Physician: Lillard Anes, MD Primary Cardiologist: Dr. Burt Knack Reason for Consultation: DIMITRI DSOUZA is a 83 y.o. female who is being seen today for the evaluation of Loop implant 2/2 cryptogenic stroke at the request of Dr. Leonie Man   History of Present Illness VERNIA TEEM was admitted on 06/22/2016 with LLE weakness.  They first developed symptoms while at home.  PMHx includes CAD/PVD w/ right CEA, HTN, HLD, DM.  Imaging demonstrated Small infarction centered in the anterior body of corpus callosum - possibly embolic - source unknown..  she has undergone workup for stroke including echocardiogram and carotid dopplers.  The patient has been monitored on telemetry which has demonstrated sinus rhythm with no arrhythmias.  Inpatient stroke work-up is to be completed with a TEE.    Prelim: Carotid Doppler - 1-39% internal carotid artery stenosis bilaterally. Vertebral arteries are patent with antegrade flow  Echocardiogram this admission demonstrated    Study Conclusions - Left ventricle: There is a large apical and distal anterior   akinetic area. No definate thrombus is seen but if embolus   suspected may repeat with Definity. Virgie Dad is also a   consideration. The cavity size was normal. Wall thickness was   increased in a pattern of mild LVH. Systolic function was mildly   to moderately reduced. The estimated ejection fraction was in the   range of 40% to 45%. Akinesis of the mid-apicalanterior and   apical myocardium. - Mitral valve: There was moderate regurgitation directed   posteriorly and toward the free wall. - Left atrium: The atrium was mildly dilated. - Tricuspid valve: There was moderate regurgitation. - Pulmonary arteries: Systolic pressure was mildly increased. PA   peak pressure: 40 mm Hg (S).  Lab work is  reviewed.  Prior to admission, the patient denies chest pain, shortness of breath, dizziness, palpitations, or syncope.  The patient reports back to baseline from their stroke with plans to home at discharge.  EP has been asked to evaluate for placement of an implantable loop recorder to monitor for atrial fibrillation.     Past Medical History:  Diagnosis Date  . Anxiety   . Arthritis    Cervical DDD C5-6 and C6-7  . Atheroembolism   . Blue toe syndrome ([place])   . CAD (coronary artery disease)   . Cancer ([place])    skin BCC  Forehead and  SCC Left Lower Leg  . Carotid artery occlusion   . Chronic kidney disease, stage III (moderate)   . DDD (degenerative disc disease)   . Depression   . DM (diabetes mellitus) ([place])   . Gallstones   . GERD (gastroesophageal reflux disease)   . Heart murmur   . Hemorrhoid   . Hemorrhoids   . Hiatal hernia   . History of shingles   . Hyperlipidemia   . Hypertension   . Leg pain   . Mild incontinence   . Multiple thyroid nodules    checked every year  . Myocardial infarction ([place]) 03/15/95  . Osteopenia   . Peptic ulcer disease   . Thyroid nodule      Surgical History:  Past Surgical History:  Procedure Laterality Date  . APPENDECTOMY    . Basal Cell Cancer Removal     forehead  . CATARACT EXTRACTION    . CORONARY ANGIOPLASTY  03-15-1995  .  CORONARY ANGIOPLASTY WITH STENT PLACEMENT  05-19-1998  . CORONARY STENT PLACEMENT  2000  . DILATION AND CURETTAGE OF UTERUS    . ENDARTERECTOMY Right 04/13/2015   Procedure: ENDARTERECTOMY CAROTID;  Surgeon: Serafina Mitchell, MD;  Location: [facility] OR;  Service: Vascular;  Laterality: Right;  . ENDOMETRIAL BIOPSY    . EYE SURGERY    . HEMORRHOID SURGERY    . INCISIONAL HERNIA REPAIR    . INGUINAL HERNIA REPAIR    . PATCH ANGIOPLASTY Right 04/13/2015   Procedure: PATCH ANGIOPLASTY;  Surgeon: Serafina Mitchell, MD;  Location: [place];  Service: Vascular;  Laterality: Right;  . PTCA    . SQUAMOUS CELL  CARCINOMA EXCISION     left lower leg  . TONSILLECTOMY       Prescriptions Prior to Admission  Medication Sig Dispense Refill Last Dose  . ACCU-CHEK SMARTVIEW test strip 1 each by Other route as needed for other (blood sugar test strips).   11 Past Week at Unknown time  . aspirin 81 MG tablet Take 1 tablet (81 mg total) by mouth daily. 1 tablet  06/22/2016 at Unknown time  . cetirizine (ZYRTEC) 10 MG tablet Take 10 mg by mouth daily as needed for allergies.   unknown  . cholecalciferol (VITAMIN D) 400 UNITS TABS Take 400 Units by mouth daily.    06/22/2016 at Unknown time  . clopidogrel (PLAVIX) 75 MG tablet Take 1 tablet (75 mg total) by mouth daily. 90 tablet 3 06/22/2016 at Unknown time  . Coenzyme Q10 (COQ-10 PO) Take 50 mg by mouth daily. 1 tab po qd   06/22/2016 at Unknown time  . conjugated estrogens (PREMARIN) vaginal cream Place 1 g vaginally once a week. .625 mg once weekly   Past Month at Unknown time  . gabapentin (NEURONTIN) 100 MG capsule Take 100 mg by mouth 2 (two) times daily.    06/22/2016 at Unknown time  . hydrocortisone 2.5 % cream Apply 1 application topically daily as needed (for itching).   1 Past Week at Unknown time  . Melatonin 5 MG CAPS Take 1 capsule by mouth at bedtime as needed (sleep).    Past Week at Unknown time  . menthol-zinc oxide (GOLD BOND) powder Apply 1 application topically 2 (two) times daily as needed (freshness.).   06/22/2016 at Unknown time  . metoprolol succinate (TOPROL-XL) 25 MG 24 hr tablet Take 1 tablet (25 mg total) by mouth daily. 90 tablet 3 06/22/2016 at Unknown time  . NEOMYCIN-POLYMYXIN-HYDROCORTISONE (CORTISPORIN) 1 % SOLN otic solution Place 2 drops into both ears as directed.   3 06/22/2016 at Unknown time  . rosuvastatin (CRESTOR) 10 MG tablet Take 1 tablet (10 mg total) by mouth daily. 90 tablet 3 06/22/2016 at Unknown time  . sertraline (ZOLOFT) 25 MG tablet Take 25 mg by mouth daily.    06/22/2016 at Unknown time  . [DISCONTINUED]  hydrochlorothiazide (MICROZIDE) 12.5 MG capsule Take 1 capsule (12.5 mg total) by mouth daily. 90 capsule 3 06/22/2016 at Unknown time  . [DISCONTINUED] ramipril (ALTACE) 2.5 MG capsule TAKE 1 CAPSULE (2.5 MG TOTAL) BY MOUTH 2 (TWO) TIMES DAILY. 180 capsule 3 06/22/2016 at Unknown time  . acetaminophen (TYLENOL) 325 MG tablet Take 325 mg by mouth every 6 (six) hours as needed for mild pain or moderate pain.   unknown  . albuterol (PROVENTIL HFA;VENTOLIN HFA) 108 (90 Base) MCG/ACT inhaler Inhale 1-2 puffs into the lungs every 6 (six) hours as needed for wheezing or shortness  of breath. (Patient not taking: Reported on 06/23/2016) 1 Inhaler 0 Not Taking at Unknown time  . benzonatate (TESSALON) 100 MG capsule Take 100 mg by mouth 3 (three) times daily as needed for cough.  2 unknown  . Calcium Carbonate Antacid (TUMS PO) Take 1 Dose by mouth as directed.    unknown  . dexlansoprazole (DEXILANT) 60 MG capsule Take 60 mg by mouth daily as needed (for acid reflux). Take 1 tablet ( Dexilant DR ) by mouth once daily   Not Taking at Unknown time  . diphenhydrAMINE (BENADRYL) 25 mg capsule Take 25 mg by mouth as needed for allergies.    Not Taking at Unknown time  . nitroGLYCERIN (NITROSTAT) 0.4 MG SL tablet Place 1 tablet (0.4 mg total) under the tongue every 5 (five) minutes as needed for chest pain. 25 tablet 2 unknown  . Polyethylene Glycol 400 (BLINK TEARS OP) Place 1 drop into both eyes daily as needed (for dry eye). Reported on 05/05/2015   unknown  . polyethylene glycol powder (GLYCOLAX/MIRALAX) powder Take 17 g by mouth daily. Reported on 06/12/2015   2 weeks ago  . Spacer/Aero-Holding Chambers (AEROCHAMBER PLUS WITH MASK) inhaler Use as instructed (Patient not taking: Reported on 06/23/2016) 1 each 2 Not Taking at Unknown time  . trolamine salicylate (ASPERCREME) 10 % cream Apply 1 application topically as needed for muscle pain.   unknown    Inpatient Medications:  . aspirin EC  81 mg Oral Daily  .  cholecalciferol  400 Units Oral Daily  . clopidogrel  75 mg Oral Daily  . enoxaparin (LOVENOX) injection  40 mg Subcutaneous Q24H  . gabapentin  100 mg Oral BID  . multivitamin with minerals  1 tablet Oral Daily  . polyethylene glycol  17 g Oral Daily  . polyethylene glycol powder  17 g Oral Daily  . rosuvastatin  10 mg Oral Daily  . sertraline  25 mg Oral Daily    Allergies:  Allergies  Allergen Reactions  . Iohexol Other (See Comments)     Desc: HIVES- 13 HR PRE-MEDS ARE REQUIRED-ASM- 03/21/05,SULFA,ADHESIVE TAPE   . Sulfonamide Derivatives Hives    REACTION: hives  . Latex Other (See Comments)    Adhesive tape and EKG adhesive leads to rash  . Tranilast Hives    Investigational medication    Social History   Social History  . Marital status: Widowed    Spouse name: N/A  . Number of children: 1  . Years of education: N/A   Occupational History  . Retired Retired    [employer]   Social History Main Topics  . Smoking status: Former Smoker    Packs/day: 1.00    Years: 54.00    Types: Cigarettes    Quit date: 10/05/1998  . Smokeless tobacco: Never Used  . Alcohol use No  . Drug use: No  . Sexual activity: No   Other Topics Concern  . Not on file   Social History Narrative  . No narrative on file     Family History  Problem Relation Age of Onset  . Diabetes Father   . Prostate cancer Father        meta to colon  . Cancer Father        Prostate  . Parkinsonism Mother   . Stroke Paternal Uncle   . Hypertension Paternal Uncle   . Heart attack Neg Hx       Review of Systems: All other systems reviewed and  are otherwise negative except as noted above.  Physical Exam: Vitals:   06/23/16 1200 06/23/16 1631 06/23/16 2300 06/24/16 0518  BP: (!) 111/49 (!) 98/55 125/69 (!) 114/59  Pulse: 65 63 96 69  Resp: _0 Temp: 98.1 F (36.7 C) 97.8 F (36.6 C) 98 F (36.7 C) 97.9 F (36.6 C)  TempSrc:  Oral Oral Oral  SpO2: 96% 99% 97% 96%    Weight:      Height:        GEN- The patient is well appearing, alert and oriented x 3 today.   Head- normocephalic, atraumatic Eyes-  Sclera clear, conjunctiva pink Ears- hearing intact Oropharynx- clear Neck- supple Lungs- CTA b/l, normal work of breathing Heart- RRR, no murmurs, rubs or gallops  GI- soft, NT, ND Extremities- no clubbing, cyanosis, or edema MS- no significant deformity, age appropriate atrophy Skin- no rash or lesion Psych- euthymic mood, full affect   Labs:   Lab Results  Component Value Date   WBC 7.1 06/24/2016   HGB 12.9 06/24/2016   HCT 39.9 06/24/2016   MCV 89.7 06/24/2016   PLT 169 06/24/2016    Recent Labs Lab 06/24/16 0215  NA 144  K 4.3  CL 108  CO2 29  BUN 15  CREATININE 1.01*  CALCIUM 8.6*  PROT 5.6*  BILITOT 0.6  ALKPHOS 53  ALT 26  AST 27  GLUCOSE 128*   Lab Results  Component Value Date   CKTOTAL 41 04/15/2014   CKMB 1.1 06/20/2011   Lab Results  Component Value Date   CHOL 102 06/23/2016   CHOL 136 01/16/2012   CHOL 161 06/20/2011   Lab Results  Component Value Date   HDL 42 06/23/2016   HDL 40.60 01/16/2012   HDL 46.00 06/20/2011   Lab Results  Component Value Date   LDLCALC 37 06/23/2016   LDLCALC 65 01/16/2012   LDLCALC 76 06/20/2011   Lab Results  Component Value Date   TRIG 116 06/23/2016   TRIG 154.0 (H) 01/16/2012   TRIG 195.0 (H) 06/20/2011   Lab Results  Component Value Date   CHOLHDL 2.4 06/23/2016   CHOLHDL 3 01/16/2012   CHOLHDL 4 06/20/2011   No results found for: LDLDIRECT  No results found for: DDIMER   Radiology/Studies:  Mr Jodene Nam Head Wo Contrast Result Date: 06/23/2016 CLINICAL DATA:  83 y/o  F; left leg weakness. EXAM: MRA HEAD WITHOUT CONTRAST TECHNIQUE: Angiographic images of the Circle of Willis were obtained using MRA technique without intravenous contrast. COMPARISON:  06/23/2016 MRI of the head. FINDINGS: Anterior circulation: No large vessel occlusion, aneurysm, or  significant stenosis is identified. Posterior circulation: No large vessel occlusion, aneurysm, or significant stenosis is identified. Anatomic variant: Patent anterior and left posterior communicating arteries. No right posterior communicating artery identified, likely hypoplastic or absent. IMPRESSION: Patent circle of Willis. No large vessel occlusion, aneurysm, or significant stenosis is identified. Electronically Signed   By: Kristine Garbe M.D.   On: 06/23/2016 05:53    Mr Brain Wo Contrast Result Date: 06/23/2016 CLINICAL DATA:  83 y/o  F; left leg weakness. EXAM: MRI HEAD WITHOUT CONTRAST TECHNIQUE: Multiplanar, multiecho pulse sequences of the brain and surrounding structures were obtained without intravenous contrast. COMPARISON:  04/15/2006 MRI of the brain. FINDINGS: Brain: Small focus of reduced diffusion in the right anterior body of corpus callosum extending into right frontal periventricular white matter (series 400, image 34) compatible with acute/early subacute infarction. No evidence for acute hemorrhage.  There are multiple scattered foci of T2 FLAIR hyperintense signal abnormality in subcortical and periventricular white matter that are nonspecific but compatible with mild chronic microvascular ischemic changes for age and mildly progressed from 2008. Additionally there is mild progression of parenchymal volume loss. No hydrocephalus or extra-axial collection. No focal mass effect. Vascular: Normal flow voids. Skull and upper cervical spine: Normal marrow signal. Sinuses/Orbits: Trace right mastoid effusion. Mild ethmoid sinus mucosal thickening. Other: None. IMPRESSION: 1. Small acute/early subacute infarction centered in the anterior body of corpus callosum extending into right frontal periventricular white matter. No acute hemorrhage identified. 2. Mild progression of chronic microvascular ischemic changes and parenchymal volume loss of the brain from 2008. 3. Trace right mastoid  effusion. These results will be called to the ordering clinician or representative by the Radiologist Assistant, and communication documented in the PACS or zVision Dashboard. Electronically Signed   By: Kristine Garbe M.D.   On: 06/23/2016 03:24   Dg Chest Port 1 View Result Date: 06/23/2016 CLINICAL DATA:  Stroke, left leg weakness yesterday. History of MI, diabetes and hypertension. Former smoker. EXAM: PORTABLE CHEST 1 VIEW COMPARISON:  Chest x-rays dated 03/02/2016 and 04/15/2006. FINDINGS: Heart size is upper normal, stable. Overall cardiomediastinal silhouette is stable. Atherosclerotic changes noted at the aortic arch. Lungs are clear. Lungs are hyperexpanded. No acute or suspicious osseous finding. IMPRESSION: 1. No active disease.  No evidence of pneumonia or pulmonary edema. 2. Hyperexpanded lungs suggesting COPD. 3. Aortic atherosclerosis. Electronically Signed   By: Franki Cabot M.D.   On: 06/23/2016 15:14    12-lead ECG SR, RBBB, LAD All prior EKG's in EPIC reviewed with no documented atrial fibrillation  Telemetry SR, occ PAC's, rare PVC  Assessment and Plan:  1. Cryptogenic stroke The patient presents with cryptogenic stroke.  The patient has a TEE planned for this AM.  I spoke at length with the patient and her daughter about monitoring for afib with either a 30 day event monitor or an implantable loop recorder.  Risks, benefits, and alteratives to implantable loop recorder were discussed with the patient today.   At this time, the patient is very clear in their decision to proceed with implantable loop recorder.   Wound care was reviewed with the patient (keep incision clean and dry for 3 days).  Wound check will be scheduled for the patient  Please call with questions.   Baldwin [place], PA-C 06/24/2016  Pt seen and examined and plan reviewed with her and daughter  Will proceed with LINQ,   BP is low but if possible would resume ACE given LV dysfunction

## 2016-06-24 NOTE — Progress Notes (Signed)
Patient is discharged from room 5C16 at this time. Alert and in stable condition. IV site d/c'd as well as tele. Instructions read to patient and daughter with understanding verbalized. Left unit via wheelchair with all belongings at side.

## 2016-06-24 NOTE — Telephone Encounter (Signed)
I spoke with Stacy [name] and made her aware that I will forward this message to Dr Burt Knack to make him aware that the pt is currently admitted.  FYI Pt is scheduled for TEE with Dr Meda Coffee today.

## 2016-06-24 NOTE — Progress Notes (Signed)
STROKE TEAM PROGRESS NOTE   HISTORY OF PRESENT ILLNESS (per record) Stacy [name] is a 83 y.o. female with a history of right ICA stenosis who presents with left leg weakness. She states that it  Has come and gone over the course of the evening, first starting around 6:30 PM. She states that most recently, it happened even while she was here after she got up to go the bathroom. Does not present when laying down.   LKW: 6:30 PM tpa given?: no, symptoms resolved, mild symptoms   SUBJECTIVE (INTERVAL HISTORY) Her  daughter is at the bedside.  She states her leg weakness continues to improve . She awaits TEE and loop today   OBJECTIVE Temp:  [97.8 F (36.6 C)-98 F (36.7 C)] 98 F (36.7 C) (05/14 1305) Pulse Rate:  [57-96] 58 (05/14 1335) Cardiac Rhythm: Normal sinus rhythm (05/14 0727) Resp:  [12-21] 20 (05/14 1335) BP: (98-153)/(33-69) 109/59 (05/14 1335) SpO2:  [93 %-99 %] 94 % (05/14 1335)  CBC:   Recent Labs Lab 06/22/16 2345 06/24/16 0215  WBC 8.8 7.1  NEUTROABS 4.3  --   HGB 13.4 12.9  HCT 41.3 39.9  MCV 89.6 89.7  PLT 179 716    Basic Metabolic Panel:   Recent Labs Lab 06/22/16 2345 06/24/16 0215  NA 138 144  K 3.5 4.3  CL 104 108  CO2 24 29  GLUCOSE 121* 128*  BUN 18 15  CREATININE 1.14* 1.01*  CALCIUM 8.9 8.6*    Lipid Panel:     Component Value Date/Time   CHOL 102 06/23/2016 0441   TRIG 116 06/23/2016 0441   HDL 42 06/23/2016 0441   CHOLHDL 2.4 06/23/2016 0441   VLDL 23 06/23/2016 0441   LDLCALC 37 06/23/2016 0441   HgbA1c:  Lab Results  Component Value Date   HGBA1C 7.2 (H) 06/23/2016   Urine Drug Screen: No results found for: LABOPIA, COCAINSCRNUR, LABBENZ, AMPHETMU, THCU, LABBARB  Alcohol Level No results found for: [facility]  IMAGING  Mr Jodene Nam Head Wo Contrast 06/23/2016 Patent circle of Willis. No large vessel occlusion, aneurysm, or significant stenosis is identified.     Mr Brain Wo Contrast 06/23/2016 1. Small acute/early  subacute infarction centered in the anterior body of corpus callosum extending into right frontal periventricular white matter. No acute hemorrhage identified.  2. Mild progression of chronic microvascular ischemic changes and parenchymal volume loss of the brain from 2008.  3. Trace right mastoid effusion.     PHYSICAL EXAM Pleasant elderly Caucasian lady not in distress. . Afebrile. Head is nontraumatic. Neck is supple without bruit.    Cardiac exam no murmur or gallop. Lungs are clear to auscultation. Distal pulses are well felt.  Neurological Exam :  Awake alert oriented x3. Normal speech and language. Vision acuity and fields appear normal. Fundi not visualized. Face is symmetric. Tongue midline. Normal strength in upper extremities bilaterally. Mild weakness of the left hip flexors and ankle dorsiflexors only. Reflexes are symmetric. Plantars are downgoing. Touch pinprick sensation are preser bilaterally. Coordination is accurate. Gait was not tested.   ASSESSMENT/PLAN Stacy [name] is a 83 y.o. female with history of coronary artery disease with MI, hypertension, hyperlipidemia, diabetes mellitus, carotid artery disease, athero embolism with blue toe syndrome, and anxiety presenting with left leg weakness. She did not receive IV t-PA due to minimal transient deficits.  Stroke: Small infarction centered in the anterior body of corpus callosum - possibly embolic - source unknown.  Resultant  Mild left leg monoparesis  MRI - Small acute/early subacute infarction centered in the anterior body of corpus callosum   MRA - unremarkable  Carotid Doppler - 1-39% internal carotid artery stenosis bilaterally. Vertebral arteries are patent with antegrade flow. 2D Echo -There is a large apical and distal anterior   akinetic area. No definate thrombus is seen but if embolus   suspected may repeat with Definity. Virgie Dad is also a   consideration. The cavity size was normal. Wall thickness  was   increased in a pattern of mild LVH. Systolic function was mildly   to moderately reduced. The estimated ejection fraction was in the   range of 40% to 45%. Akinesis of the mid-apicalanterior and    apical myocardium.  Bilateral LE duplex - negative for DVT  LDL - 37  HgbA1c 7.2  VTE prophylaxis - Lovenox Diet NPO time specified  aspirin 81 mg daily and clopidogrel 75 mg daily prior to admission, now on aspirin 81 mg daily and clopidogrel 75 mg daily  Patient counseled to be compliant with her antithrombotic medications  Ongoing aggressive stroke risk factor management  Therapy recommendations:  No OT follow-up recommended. PT evaluation pending  Disposition:  Pending  Hypertension  Stable  Permissive hypertension (OK if < 220/120) but gradually normalize in 5-7 days  Long-term BP goal normotensive  Hyperlipidemia  Home meds:  Crestor 10 mg daily resumed in hospital  LDL 37, goal < 70  Continue statin at discharge  Diabetes  HgbA1c pending, goal < 7.0  Unc / Controlled  Other Stroke Risk Factors  Advanced age  The patient quit smoking 17 years ago  Overweight, Body mass index is 25.95 kg/m., recommend weight loss, diet and exercise as appropriate   Family hx stroke (paternal uncle)  Coronary artery disease   Other Active Problems  Creatinine - 1.14   PLAN TEE and possible loop today -   Hospital day # 1  I have personally examined this patient, reviewed notes, independently viewed imaging studies, participated in medical decision making and plan of care.ROS completed by me personally and pertinent positives fully documented  I have made any additions or clarifications directly to the above note. She presented with left leg weakness do you to embolic right anterior cerebral artery infarct and needs ongoing stroke evaluation. Continue aspirin and Plavix given the recent cardiac stents. Check TEE and loop recorder  . Discussed with patient and  daughter and answered questions.   Antony Contras, MD Medical Director [facility] Pager: [phone] 06/24/2016 2:49 PM   To contact Stroke Continuity provider, please refer to [email or web address] After hours, contact General Neurology

## 2016-06-24 NOTE — Progress Notes (Signed)
Patient returned to room from TEE and Loop Recorder placement. Alert and in stable condition. Daughter at side.

## 2016-06-25 ENCOUNTER — Encounter (HOSPITAL_COMMUNITY): Payer: Self-pay | Admitting: Internal Medicine

## 2016-06-26 ENCOUNTER — Encounter (HOSPITAL_COMMUNITY): Payer: Self-pay

## 2016-06-28 ENCOUNTER — Encounter (HOSPITAL_COMMUNITY): Payer: Self-pay

## 2016-07-01 ENCOUNTER — Other Ambulatory Visit: Payer: Self-pay | Admitting: Legal Medicine

## 2016-07-01 ENCOUNTER — Encounter (HOSPITAL_COMMUNITY): Payer: Self-pay

## 2016-07-02 DIAGNOSIS — I69354 Hemiplegia and hemiparesis following cerebral infarction affecting left non-dominant side: Secondary | ICD-10-CM | POA: Diagnosis not present

## 2016-07-03 ENCOUNTER — Encounter (HOSPITAL_COMMUNITY): Payer: Self-pay

## 2016-07-04 ENCOUNTER — Ambulatory Visit: Payer: Medicare Other | Admitting: *Deleted

## 2016-07-04 ENCOUNTER — Encounter: Payer: Self-pay | Admitting: *Deleted

## 2016-07-05 ENCOUNTER — Encounter (HOSPITAL_COMMUNITY): Payer: Self-pay

## 2016-07-09 LAB — CUP PACEART INCLINIC DEVICE CHECK
Implantable Pulse Generator Implant Date: 20180514
MDC IDC SESS DTM: 20180524201844

## 2016-07-10 ENCOUNTER — Encounter (HOSPITAL_COMMUNITY): Payer: Self-pay

## 2016-07-12 ENCOUNTER — Telehealth: Payer: Self-pay | Admitting: *Deleted

## 2016-07-12 ENCOUNTER — Encounter (HOSPITAL_COMMUNITY)
Admission: RE | Admit: 2016-07-12 | Discharge: 2016-07-12 | Disposition: A | Discharge status: Home / Self Care | Payer: Self-pay | Source: Ambulatory Visit | Attending: Cardiovascular Disease | Admitting: Cardiovascular Disease

## 2016-07-12 DIAGNOSIS — I2510 Atherosclerotic heart disease of native coronary artery without angina pectoris: Secondary | ICD-10-CM | POA: Insufficient documentation

## 2016-07-12 NOTE — Telephone Encounter (Signed)
LMOM requesting call back to the [facility], gave direct phone number.  Received alert for tachy episode on 07/11/16 at 1206, duration 8sec.  Will determine if patient symptomatic with episode.  Alert printed for review by MD.

## 2016-07-15 ENCOUNTER — Encounter (HOSPITAL_COMMUNITY)
Admission: RE | Admit: 2016-07-15 | Discharge: 2016-07-15 | Disposition: A | Discharge status: Home / Self Care | Payer: Self-pay | Source: Ambulatory Visit | Attending: Cardiovascular Disease | Admitting: Cardiovascular Disease

## 2016-07-15 NOTE — Telephone Encounter (Signed)
Patient daughter returning your call from Friday. Please call her back at her mobile number.

## 2016-07-15 NOTE — Telephone Encounter (Signed)
Spoke with daughter Rodena Piety, inquiring about symptoms associated with tachy episode 07/11/16 at 1206. Daughter states patient did not mention symptoms, but she will ask her when she gets home and give the [facility] a call back. [facility] number to call back.   Advised patient's daughter we will review episode with Dr. Caryl Comes tomorrow 07/16/16, per Dr. Lovena Le recommendation. Patient's daughter verbalized understanding.

## 2016-07-15 NOTE — Telephone Encounter (Signed)
Patient's daughter returned call.  She reports patient was asymptomatic with episode.  Advised patient that I will call her back tomorrow with Dr. Olin Pia recommendations and she verbalizes understanding and appreciation.

## 2016-07-16 ENCOUNTER — Encounter: Payer: Self-pay | Admitting: Internal Medicine

## 2016-07-16 NOTE — Telephone Encounter (Signed)
Dr. Caryl Comes reviewed episode and advised that it is more likely NSVT.  He spoke with Dr. [place] Knack, patient's primary cardiologist, and they decided to continue current medications and treatment plan.  Dr. Caryl Comes spoke with patient's daughter, Rodena Piety, who agreed with plan.  No changes at this time.

## 2016-07-17 ENCOUNTER — Encounter (HOSPITAL_COMMUNITY)
Admission: RE | Admit: 2016-07-17 | Discharge: 2016-07-17 | Disposition: A | Discharge status: Home / Self Care | Payer: Self-pay | Source: Ambulatory Visit | Attending: Cardiovascular Disease | Admitting: Cardiovascular Disease

## 2016-07-19 ENCOUNTER — Encounter (HOSPITAL_COMMUNITY)
Admission: RE | Admit: 2016-07-19 | Discharge: 2016-07-19 | Disposition: A | Discharge status: Home / Self Care | Payer: Self-pay | Source: Ambulatory Visit | Attending: Cardiovascular Disease | Admitting: Cardiovascular Disease

## 2016-07-22 ENCOUNTER — Encounter (HOSPITAL_COMMUNITY): Payer: Self-pay

## 2016-07-24 ENCOUNTER — Encounter (HOSPITAL_COMMUNITY)
Admission: RE | Admit: 2016-07-24 | Discharge: 2016-07-24 | Disposition: A | Discharge status: Home / Self Care | Payer: Self-pay | Source: Ambulatory Visit | Attending: Cardiovascular Disease | Admitting: Cardiovascular Disease

## 2016-07-24 ENCOUNTER — Ambulatory Visit (INDEPENDENT_AMBULATORY_CARE_PROVIDER_SITE_OTHER): Payer: Medicare Other | Admitting: *Deleted

## 2016-07-24 DIAGNOSIS — I639 Cerebral infarction, unspecified: Secondary | ICD-10-CM | POA: Diagnosis not present

## 2016-07-26 ENCOUNTER — Encounter (HOSPITAL_COMMUNITY)
Admission: RE | Admit: 2016-07-26 | Discharge: 2016-07-26 | Disposition: A | Discharge status: Home / Self Care | Payer: Self-pay | Source: Ambulatory Visit | Attending: Cardiovascular Disease | Admitting: Cardiovascular Disease

## 2016-07-26 NOTE — Progress Notes (Signed)
Carelink Summary Report / Loop Recorder 

## 2016-07-29 ENCOUNTER — Encounter (HOSPITAL_COMMUNITY)
Admission: RE | Admit: 2016-07-29 | Discharge: 2016-07-29 | Disposition: A | Discharge status: Home / Self Care | Payer: Self-pay | Source: Ambulatory Visit | Attending: Cardiovascular Disease | Admitting: Cardiovascular Disease

## 2016-07-31 ENCOUNTER — Encounter (HOSPITAL_COMMUNITY)
Admission: RE | Admit: 2016-07-31 | Discharge: 2016-07-31 | Disposition: A | Discharge status: Home / Self Care | Payer: Self-pay | Source: Ambulatory Visit | Attending: Cardiovascular Disease | Admitting: Cardiovascular Disease

## 2016-08-02 ENCOUNTER — Encounter (HOSPITAL_COMMUNITY)
Admission: RE | Admit: 2016-08-02 | Discharge: 2016-08-02 | Disposition: A | Discharge status: Home / Self Care | Payer: Self-pay | Source: Ambulatory Visit | Attending: Cardiovascular Disease | Admitting: Cardiovascular Disease

## 2016-08-04 LAB — CUP PACEART REMOTE DEVICE CHECK
Date Time Interrogation Session: 20180613193542
Implantable Pulse Generator Implant Date: 20180514

## 2016-08-04 NOTE — Progress Notes (Signed)
Carelink summary report received. Battery status OK. Normal device function. No new symptom episodes, brady, or pause episodes. No new AF episodes. 1 tachy- 26 bts NSVT, previously rev'd by SK per note, pt asymptomatic, cont. to monitor at this time. Monthly summary reports and ROV/PRN

## 2016-08-05 ENCOUNTER — Telehealth: Payer: Self-pay | Admitting: *Deleted

## 2016-08-05 ENCOUNTER — Encounter (HOSPITAL_COMMUNITY): Payer: Self-pay

## 2016-08-05 DIAGNOSIS — I480 Paroxysmal atrial fibrillation: Secondary | ICD-10-CM | POA: Insufficient documentation

## 2016-08-05 NOTE — Telephone Encounter (Signed)
Patient called back to say that at cardiac rehab on Friday afternoon she noted that her HR was above 100bpm and that this is unusual for her.  No specific symptoms with elevated HR.  Advised patient that I will add this symptom to my note and she is appreciative.  She denies additional questions or concerns at this time.

## 2016-08-05 NOTE — Telephone Encounter (Signed)
Spoke with patient to advise about new PAF findings on LINQ.  Patient had episode of AF on 6/22, duration 7hrs 34min.  Patient reports she was asymptomatic with episode.  Reviewed ECG with Dr. Curt Bears (as Dr. Caryl Comes is out of the office), who recommended f/u with AF Maurice March, NP to discuss [facility].  Patient verbalizes understanding of recommendations and requests that I speak with her daughter, Rodena Piety, to schedule appointment.  Spoke with Rodena Piety, who verbalizes understanding of recommendations.  She is agreeable to AF Clinic appointment on 08/07/16 at 10:30am.  Rodena Piety is aware of parking instructions and code, as well as office contact information.  She is appreciative of call and denies additional questions or concerns at this time.

## 2016-08-06 ENCOUNTER — Other Ambulatory Visit: Payer: Self-pay | Admitting: Cardiology

## 2016-08-07 ENCOUNTER — Encounter (HOSPITAL_COMMUNITY)
Admission: RE | Admit: 2016-08-07 | Discharge: 2016-08-07 | Disposition: A | Discharge status: Home / Self Care | Payer: Self-pay | Source: Ambulatory Visit | Attending: Cardiovascular Disease | Admitting: Cardiovascular Disease

## 2016-08-07 ENCOUNTER — Encounter (HOSPITAL_COMMUNITY): Payer: Self-pay | Admitting: Nurse Practitioner

## 2016-08-07 ENCOUNTER — Ambulatory Visit (HOSPITAL_COMMUNITY)
Admission: RE | Admit: 2016-08-07 | Discharge: 2016-08-07 | Disposition: A | Discharge status: Home / Self Care | Payer: Medicare Other | Source: Ambulatory Visit | Attending: Nurse Practitioner | Admitting: Nurse Practitioner

## 2016-08-07 VITALS — BP 132/80 | HR 61 | Ht 64.0 in | Wt 149.8 lb

## 2016-08-07 DIAGNOSIS — E785 Hyperlipidemia, unspecified: Secondary | ICD-10-CM | POA: Diagnosis not present

## 2016-08-07 DIAGNOSIS — I129 Hypertensive chronic kidney disease with stage 1 through stage 4 chronic kidney disease, or unspecified chronic kidney disease: Secondary | ICD-10-CM | POA: Diagnosis not present

## 2016-08-07 DIAGNOSIS — Z8042 Family history of malignant neoplasm of prostate: Secondary | ICD-10-CM | POA: Insufficient documentation

## 2016-08-07 DIAGNOSIS — K449 Diaphragmatic hernia without obstruction or gangrene: Secondary | ICD-10-CM | POA: Insufficient documentation

## 2016-08-07 DIAGNOSIS — I4510 Unspecified right bundle-branch block: Secondary | ICD-10-CM | POA: Insufficient documentation

## 2016-08-07 DIAGNOSIS — E1122 Type 2 diabetes mellitus with diabetic chronic kidney disease: Secondary | ICD-10-CM | POA: Insufficient documentation

## 2016-08-07 DIAGNOSIS — Z85828 Personal history of other malignant neoplasm of skin: Secondary | ICD-10-CM | POA: Insufficient documentation

## 2016-08-07 DIAGNOSIS — Z888 Allergy status to other drugs, medicaments and biological substances status: Secondary | ICD-10-CM | POA: Diagnosis not present

## 2016-08-07 DIAGNOSIS — Z8619 Personal history of other infectious and parasitic diseases: Secondary | ICD-10-CM | POA: Insufficient documentation

## 2016-08-07 DIAGNOSIS — K219 Gastro-esophageal reflux disease without esophagitis: Secondary | ICD-10-CM | POA: Diagnosis not present

## 2016-08-07 DIAGNOSIS — Z833 Family history of diabetes mellitus: Secondary | ICD-10-CM | POA: Insufficient documentation

## 2016-08-07 DIAGNOSIS — I252 Old myocardial infarction: Secondary | ICD-10-CM | POA: Insufficient documentation

## 2016-08-07 DIAGNOSIS — F419 Anxiety disorder, unspecified: Secondary | ICD-10-CM | POA: Insufficient documentation

## 2016-08-07 DIAGNOSIS — N183 Chronic kidney disease, stage 3 (moderate): Secondary | ICD-10-CM | POA: Insufficient documentation

## 2016-08-07 DIAGNOSIS — Z8249 Family history of ischemic heart disease and other diseases of the circulatory system: Secondary | ICD-10-CM | POA: Diagnosis not present

## 2016-08-07 DIAGNOSIS — I2510 Atherosclerotic heart disease of native coronary artery without angina pectoris: Secondary | ICD-10-CM | POA: Diagnosis not present

## 2016-08-07 DIAGNOSIS — F329 Major depressive disorder, single episode, unspecified: Secondary | ICD-10-CM | POA: Diagnosis not present

## 2016-08-07 DIAGNOSIS — I480 Paroxysmal atrial fibrillation: Secondary | ICD-10-CM | POA: Insufficient documentation

## 2016-08-07 DIAGNOSIS — M8580 Other specified disorders of bone density and structure, unspecified site: Secondary | ICD-10-CM | POA: Diagnosis not present

## 2016-08-07 DIAGNOSIS — Z800 Family history of malignant neoplasm of digestive organs: Secondary | ICD-10-CM | POA: Insufficient documentation

## 2016-08-07 DIAGNOSIS — Z882 Allergy status to sulfonamides status: Secondary | ICD-10-CM | POA: Diagnosis not present

## 2016-08-07 DIAGNOSIS — Z79899 Other long term (current) drug therapy: Secondary | ICD-10-CM | POA: Diagnosis not present

## 2016-08-07 DIAGNOSIS — Z823 Family history of stroke: Secondary | ICD-10-CM | POA: Insufficient documentation

## 2016-08-07 DIAGNOSIS — I491 Atrial premature depolarization: Secondary | ICD-10-CM | POA: Insufficient documentation

## 2016-08-07 DIAGNOSIS — Z9889 Other specified postprocedural states: Secondary | ICD-10-CM | POA: Insufficient documentation

## 2016-08-07 DIAGNOSIS — Z7902 Long term (current) use of antithrombotics/antiplatelets: Secondary | ICD-10-CM | POA: Diagnosis not present

## 2016-08-07 DIAGNOSIS — Z87891 Personal history of nicotine dependence: Secondary | ICD-10-CM | POA: Diagnosis not present

## 2016-08-07 DIAGNOSIS — Z8673 Personal history of transient ischemic attack (TIA), and cerebral infarction without residual deficits: Secondary | ICD-10-CM | POA: Diagnosis not present

## 2016-08-07 MED ORDER — APIXABAN 5 MG PO TABS: 5.0000 mg | ORAL_TABLET | Freq: Two times a day (BID) | ORAL | 0 refills | Status: DC

## 2016-08-07 NOTE — Progress Notes (Addendum)
Primary Care Physician: Lillard Anes, MD Referring Physician: Dr. Amil Amen Stacy [name] is a 83 y.o. female with a h/o cryptogenic stroke 5/12, PAF that is in the afib clinic for further evaluation. She had a Linq inserted at time of CVA and recently afib was documented. She is here to discuss anticoagulation. She has been stable on asa and plavix for years for CAD. Denies any bleeding history. Steady on her feet.   Today, she denies symptoms of palpitations, chest pain, shortness of breath, orthopnea, PND, lower extremity edema, dizziness, presyncope, syncope, or neurologic sequela. The patient is tolerating medications without difficulties and is otherwise without complaint today.   Past Medical History:  Diagnosis Date  . Anxiety   . Arthritis    Cervical DDD C5-6 and C6-7  . Atheroembolism   . Blue toe syndrome ([place])   . CAD (coronary artery disease)   . Cancer ([place])    skin BCC  Forehead and  SCC Left Lower Leg  . Carotid artery occlusion   . Chronic kidney disease, stage III (moderate)   . DDD (degenerative disc disease)   . Depression   . DM (diabetes mellitus) ([place])   . Gallstones   . GERD (gastroesophageal reflux disease)   . Heart murmur   . Hemorrhoid   . Hemorrhoids   . Hiatal hernia   . History of shingles   . Hyperlipidemia   . Hypertension   . Leg pain   . Mild incontinence   . Multiple thyroid nodules    checked every year  . Myocardial infarction ([place]) 03/15/95  . Osteopenia   . Peptic ulcer disease   . Thyroid nodule    Past Surgical History:  Procedure Laterality Date  . APPENDECTOMY    . Basal Cell Cancer Removal     forehead  . CATARACT EXTRACTION    . CORONARY ANGIOPLASTY  03-15-1995  . CORONARY ANGIOPLASTY WITH STENT PLACEMENT  05-19-1998  . CORONARY STENT PLACEMENT  2000  . DILATION AND CURETTAGE OF UTERUS    . ENDARTERECTOMY Right 04/13/2015   Procedure: ENDARTERECTOMY CAROTID;  Surgeon: Serafina Mitchell, MD;  Location: [facility] OR;   Service: Vascular;  Laterality: Right;  . ENDOMETRIAL BIOPSY    . EYE SURGERY    . HEMORRHOID SURGERY    . INCISIONAL HERNIA REPAIR    . INGUINAL HERNIA REPAIR    . LOOP RECORDER INSERTION N/A 06/24/2016   Procedure: Loop Recorder Insertion;  Surgeon: Deboraha Sprang, MD;  Location: [facility] CV LAB;  Service: Cardiovascular;  Laterality: N/A;  . PATCH ANGIOPLASTY Right 04/13/2015   Procedure: PATCH ANGIOPLASTY;  Surgeon: Serafina Mitchell, MD;  Location: [facility] OR;  Service: Vascular;  Laterality: Right;  . PTCA    . SQUAMOUS CELL CARCINOMA EXCISION     left lower leg  . TEE WITHOUT CARDIOVERSION N/A 06/24/2016   Procedure: TRANSESOPHAGEAL ECHOCARDIOGRAM (TEE);  Surgeon: Dorothy Spark, MD;  Location: [facility] ENDOSCOPY;  Service: Cardiovascular;  Laterality: N/A;  . TONSILLECTOMY      Current Outpatient Prescriptions  Medication Sig Dispense Refill  . ACCU-CHEK SMARTVIEW test strip 1 each by Other route as needed for other (blood sugar test strips).   11  . acetaminophen (TYLENOL) 325 MG tablet Take 325 mg by mouth every 6 (six) hours as needed for mild pain or moderate pain.    Marland Kitchen albuterol (PROVENTIL HFA;VENTOLIN HFA) 108 (90 Base) MCG/ACT inhaler Inhale 1-2 puffs into the lungs every 6 (  six) hours as needed for wheezing or shortness of breath. 1 Inhaler 0  . benzonatate (TESSALON) 100 MG capsule Take 100 mg by mouth 3 (three) times daily as needed for cough.  2  . Calcium Carbonate Antacid (TUMS PO) Take 1 Dose by mouth as directed.     . cetirizine (ZYRTEC) 10 MG tablet Take 10 mg by mouth daily as needed for allergies.    . cholecalciferol (VITAMIN D) 400 UNITS TABS Take 400 Units by mouth daily.     . Coenzyme Q10 (COQ-10 PO) Take 50 mg by mouth daily. 1 tab po qd    . conjugated estrogens (PREMARIN) vaginal cream Place 1 g vaginally once a week. .625 mg once weekly    . dexlansoprazole (DEXILANT) 60 MG capsule Take 60 mg by mouth daily as needed (for acid reflux). Take 1 tablet ( Dexilant  DR ) by mouth once daily    . diphenhydrAMINE (BENADRYL) 25 mg capsule Take 25 mg by mouth as needed for allergies.     Marland Kitchen gabapentin (NEURONTIN) 100 MG capsule Take 100 mg by mouth 2 (two) times daily.     . hydrochlorothiazide (MICROZIDE) 12.5 MG capsule Take 1 capsule (12.5 mg total) by mouth daily. 90 capsule 3  . hydrocortisone 2.5 % cream Apply 1 application topically daily as needed (for itching).   1  . Melatonin 5 MG CAPS Take 1 capsule by mouth at bedtime as needed (sleep).     . menthol-zinc oxide (GOLD BOND) powder Apply 1 application topically 2 (two) times daily as needed (freshness.).    Marland Kitchen metoprolol succinate (TOPROL-XL) 25 MG 24 hr tablet Take 1 tablet (25 mg total) by mouth daily. 90 tablet 3  . Multiple Vitamins-Minerals (ICAPS AREDS 2 PO) Take by mouth.    . NEOMYCIN-POLYMYXIN-HYDROCORTISONE (CORTISPORIN) 1 % SOLN otic solution Place 2 drops into both ears as directed.   3  . nitroGLYCERIN (NITROSTAT) 0.4 MG SL tablet Place 1 tablet (0.4 mg total) under the tongue every 5 (five) minutes as needed for chest pain. 25 tablet 2  . Polyethylene Glycol 400 (BLINK TEARS OP) Place 1 drop into both eyes daily as needed (for dry eye). Reported on 05/05/2015    . polyethylene glycol powder (GLYCOLAX/MIRALAX) powder Take 17 g by mouth daily. Reported on 06/12/2015    . ramipril (ALTACE) 2.5 MG capsule TAKE 1 CAPSULE (2.5 MG TOTAL) BY MOUTH 2 (TWO) TIMES DAILY. 180 capsule 3  . rosuvastatin (CRESTOR) 10 MG tablet Take 1 tablet (10 mg total) by mouth daily. 90 tablet 3  . sertraline (ZOLOFT) 25 MG tablet Take 25 mg by mouth daily.     Marland Kitchen trolamine salicylate (ASPERCREME) 10 % cream Apply 1 application topically as needed for muscle pain.    Marland Kitchen apixaban (ELIQUIS) 5 MG TABS tablet Take 1 tablet (5 mg total) by mouth 2 (two) times daily. 60 tablet 0  . Spacer/Aero-Holding Chambers (AEROCHAMBER PLUS WITH MASK) inhaler Use as instructed (Patient not taking: Reported on 08/07/2016) 1 each 2   No current  facility-administered medications for this encounter.     Allergies  Allergen Reactions  . Iohexol Other (See Comments)     Desc: HIVES- 13 HR PRE-MEDS ARE REQUIRED-ASM- 03/21/05,SULFA,ADHESIVE TAPE   . Sulfonamide Derivatives Hives    REACTION: hives  . Latex Other (See Comments)    Adhesive tape and EKG adhesive leads to rash  . Tranilast Hives    Investigational medication    Social History  Social History  . Marital status: Widowed    Spouse name: N/A  . Number of children: 1  . Years of education: N/A   Occupational History  . Retired Retired    [employer]   Social History Main Topics  . Smoking status: Former Smoker    Packs/day: 1.00    Years: 54.00    Types: Cigarettes    Quit date: 10/05/1998  . Smokeless tobacco: Never Used  . Alcohol use No  . Drug use: No  . Sexual activity: No   Other Topics Concern  . Not on file   Social History Narrative  . No narrative on file    Family History  Problem Relation Age of Onset  . Diabetes Father   . Prostate cancer Father        meta to colon  . Cancer Father        Prostate  . Parkinsonism Mother   . Stroke Paternal Uncle   . Hypertension Paternal Uncle   . Heart attack Neg Hx     ROS- All systems are reviewed and negative except as per the HPI above  Physical Exam: Vitals:   08/07/16 1034  BP: 132/80  Pulse: 61  Weight: 149 lb 12.8 oz (67.9 kg)  Height: 5' 4"  (1.626 m)   Wt Readings from Last 3 Encounters:  08/07/16 149 lb 12.8 oz (67.9 kg)  06/23/16 151 lb 3.2 oz (68.6 kg)  05/22/16 150 lb 6.4 oz (68.2 kg)    Labs: Lab Results  Component Value Date   NA 144 06/24/2016   K 4.3 06/24/2016   CL 108 06/24/2016   CO2 29 06/24/2016   GLUCOSE 128 (H) 06/24/2016   BUN 15 06/24/2016   CREATININE 1.01 (H) 06/24/2016   CALCIUM 8.6 (L) 06/24/2016   Lab Results  Component Value Date   INR 1.06 04/05/2015   Lab Results  Component Value Date   CHOL 102 06/23/2016   HDL 42 06/23/2016     LDLCALC 37 06/23/2016   TRIG 116 06/23/2016     GEN- The patient is well appearing, alert and oriented x 3 today.   Head- normocephalic, atraumatic Eyes-  Sclera clear, conjunctiva pink Ears- hearing intact Oropharynx- clear Neck- supple, no JVP Lymph- no cervical lymphadenopathy Lungs- Clear to ausculation bilaterally, normal work of breathing Heart- Regular rate and rhythm, no murmurs, rubs or gallops, PMI not laterally displaced GI- soft, NT, ND, + BS Extremities- no clubbing, cyanosis, or edema MS- no significant deformity or atrophy Skin- no rash or lesion Psych- euthymic mood, full affect Neuro- strength and sensation are intact  EKG- SR with PAC's RBBB, LAFB, v rate at 61 bpm, qrs int 124 ms, qtc 432 ms Epic records reviewed Linq report reviewed    Assessment and Plan: 1. Paroxysmal afib in the setting of acute CVA General education re afib  Bleeding risk reviewed with no known bleeding risk, steady on feet I discussed with Dr. Burt Knack with pt already on dual antiplatelet therapy, asa/plavix that would increase risk of bleeding He feels that she has stable CAD and to minimize risk of bleeding, he will stop both Discussed with pt options of xarelto/ eliquis, and coumadin For bleeding safety profile, will start eliquis 5 mg bid (age over 20, but creatinine less than 1.5 (at 1.01), and weight over 60 kg) Bleeding precautions discussed Continue metoprolol for afib as well as CAD Continue linq for afib burden but pt is currently asymptomatic  F/u in 3  weeks with cbc/bmet  Butch Penny C. Carroll, [facility] [address] [place], [place] [address] [phone]

## 2016-08-07 NOTE — Patient Instructions (Signed)
Stop Aspirin  Stop Plavix  Start Eliquis 5 mg; 1 tablet twice a day  See [place] back in three weeks  It was a pleasure to see you today!

## 2016-08-08 ENCOUNTER — Encounter: Payer: Self-pay | Admitting: Internal Medicine

## 2016-08-08 ENCOUNTER — Encounter: Payer: Self-pay | Admitting: Cardiology

## 2016-08-08 ENCOUNTER — Telehealth: Payer: Self-pay | Admitting: *Deleted

## 2016-08-08 NOTE — Telephone Encounter (Signed)
If persists would ask for rhythm strip from cardiac rehab

## 2016-08-08 NOTE — Telephone Encounter (Signed)
Called patient about tachy episode from 6/27 @1424 . Patient denies any sx's during the time of the episode. Will review with Dr.Klein and notify patient if anything is recommended. Patient verbalized understanding.

## 2016-08-08 NOTE — Progress Notes (Signed)
Probably best to do a Myoview scan to rule out significant area of ischemia. Will arrange - thx Richardson Landry

## 2016-08-08 NOTE — Progress Notes (Signed)
Patient's implanted monitor demonstrated another episode of nonsustained ventricular tachycardia. This had occurred a couple of weeks ago at which time I spoke with Dr. Burt Knack. It was his decision at that time that we would not undertake further treatment or evaluation. I will review this with him again to make sure that he would like to continue to hold steady on this course. Last left ventricular function assessment 40-45.

## 2016-08-09 ENCOUNTER — Encounter (HOSPITAL_COMMUNITY)
Admission: RE | Admit: 2016-08-09 | Discharge: 2016-08-09 | Disposition: A | Discharge status: Home / Self Care | Payer: Self-pay | Source: Ambulatory Visit | Attending: Cardiovascular Disease | Admitting: Cardiovascular Disease

## 2016-08-12 ENCOUNTER — Encounter (HOSPITAL_COMMUNITY)
Admission: RE | Admit: 2016-08-12 | Discharge: 2016-08-12 | Disposition: A | Discharge status: Home / Self Care | Payer: Self-pay | Source: Ambulatory Visit | Attending: Cardiovascular Disease | Admitting: Cardiovascular Disease

## 2016-08-12 DIAGNOSIS — I2510 Atherosclerotic heart disease of native coronary artery without angina pectoris: Secondary | ICD-10-CM | POA: Insufficient documentation

## 2016-08-12 DIAGNOSIS — I472 Ventricular tachycardia: Secondary | ICD-10-CM | POA: Insufficient documentation

## 2016-08-12 DIAGNOSIS — I252 Old myocardial infarction: Secondary | ICD-10-CM | POA: Insufficient documentation

## 2016-08-13 ENCOUNTER — Other Ambulatory Visit: Payer: Self-pay

## 2016-08-13 ENCOUNTER — Telehealth: Payer: Self-pay | Admitting: Cardiovascular Disease

## 2016-08-13 DIAGNOSIS — I4729 Other ventricular tachycardia: Secondary | ICD-10-CM

## 2016-08-13 DIAGNOSIS — I2510 Atherosclerotic heart disease of native coronary artery without angina pectoris: Secondary | ICD-10-CM

## 2016-08-13 DIAGNOSIS — I252 Old myocardial infarction: Secondary | ICD-10-CM

## 2016-08-13 DIAGNOSIS — I472 Ventricular tachycardia: Secondary | ICD-10-CM

## 2016-08-13 NOTE — Progress Notes (Signed)
See telephone note in regards to arranging lexiscan myoview.  Stacy [name] 2:28 PM 08/13/2016

## 2016-08-13 NOTE — Telephone Encounter (Signed)
I spoke with the pt's daughter and made her aware that Dr Burt Knack recommends that the pt proceed with lexiscan myoview due to NSVT on Linq monitor. Order placed and test reviewed with the pt's daughter.  I will have a Lydia contact Rodena Piety with an appointment.

## 2016-08-13 NOTE — Telephone Encounter (Signed)
Follow Up:    Please call,said she just finished talking to you earlier. She says she have a few more questions she needs to ask please.

## 2016-08-16 ENCOUNTER — Encounter (HOSPITAL_COMMUNITY)
Admission: RE | Admit: 2016-08-16 | Discharge: 2016-08-16 | Disposition: A | Discharge status: Home / Self Care | Payer: Self-pay | Source: Ambulatory Visit | Attending: Cardiovascular Disease | Admitting: Cardiovascular Disease

## 2016-08-19 ENCOUNTER — Telehealth (HOSPITAL_COMMUNITY): Payer: Self-pay | Admitting: *Deleted

## 2016-08-19 ENCOUNTER — Encounter (HOSPITAL_COMMUNITY): Payer: Self-pay

## 2016-08-19 NOTE — Telephone Encounter (Signed)
Patient given detailed instructions per Myocardial Perfusion Study Information Sheet for the test on 08/21/16 at 1000. Patient notified to arrive 15 minutes early and that it is imperative to arrive on time for appointment to keep from having the test rescheduled.  If you need to cancel or reschedule your appointment, please call the office within 24 hours of your appointment. . Patient verbalized understanding.Xylah Early, Ranae Palms

## 2016-08-21 ENCOUNTER — Ambulatory Visit (HOSPITAL_COMMUNITY): Payer: Medicare Other | Attending: Cardiovascular Disease

## 2016-08-21 ENCOUNTER — Encounter (HOSPITAL_COMMUNITY): Payer: Self-pay

## 2016-08-21 DIAGNOSIS — I472 Ventricular tachycardia: Secondary | ICD-10-CM

## 2016-08-21 DIAGNOSIS — I4729 Other ventricular tachycardia: Secondary | ICD-10-CM

## 2016-08-21 DIAGNOSIS — I2510 Atherosclerotic heart disease of native coronary artery without angina pectoris: Secondary | ICD-10-CM

## 2016-08-21 DIAGNOSIS — I252 Old myocardial infarction: Secondary | ICD-10-CM | POA: Diagnosis not present

## 2016-08-21 DIAGNOSIS — Z8673 Personal history of transient ischemic attack (TIA), and cerebral infarction without residual deficits: Secondary | ICD-10-CM | POA: Insufficient documentation

## 2016-08-21 DIAGNOSIS — I4891 Unspecified atrial fibrillation: Secondary | ICD-10-CM | POA: Insufficient documentation

## 2016-08-21 DIAGNOSIS — R9431 Abnormal electrocardiogram [ECG] [EKG]: Secondary | ICD-10-CM | POA: Insufficient documentation

## 2016-08-21 DIAGNOSIS — R000 Tachycardia, unspecified: Secondary | ICD-10-CM | POA: Insufficient documentation

## 2016-08-21 DIAGNOSIS — I10 Essential (primary) hypertension: Secondary | ICD-10-CM | POA: Insufficient documentation

## 2016-08-21 LAB — MYOCARDIAL PERFUSION IMAGING
CHL CUP NUCLEAR SDS: 12
CHL CUP NUCLEAR SRS: 25
CHL CUP NUCLEAR SSS: 37
CHL CUP RESTING HR STRESS: 57 {beats}/min
CHL CUP STRESS STAGE 1 DBP: 47 mmHg
CHL CUP STRESS STAGE 1 SBP: 126 mmHg
CHL CUP STRESS STAGE 3 GRADE: 0 %
CHL CUP STRESS STAGE 3 SPEED: 0 mph
CHL CUP STRESS STAGE 4 HR: 85 {beats}/min
CHL CUP STRESS STAGE 4 SPEED: 0 mph
CHL CUP STRESS STAGE 5 DBP: 33 mmHg
CHL CUP STRESS STAGE 5 SBP: 122 mmHg
CHL CUP STRESS STAGE 5 SPEED: 0 mph
CHL CUP STRESS STAGE 6 DBP: 53 mmHg
CHL CUP STRESS STAGE 6 SBP: 123 mmHg
CSEPEW: 1 METS
CSEPPHR: 85 {beats}/min
LV sys vol: 40 mL
LVDIAVOL: 91 mL (ref 46–106)
Percent of predicted max HR: 64 %
RATE: 0.37
Stage 1 Grade: 0 %
Stage 1 HR: 55 {beats}/min
Stage 1 Speed: 0 mph
Stage 2 Grade: 0 %
Stage 2 HR: 55 {beats}/min
Stage 2 Speed: 0 mph
Stage 3 DBP: 55 mmHg
Stage 3 HR: 69 {beats}/min
Stage 3 SBP: 116 mmHg
Stage 4 Grade: 0 %
Stage 5 Grade: 0 %
Stage 5 HR: 78 {beats}/min
Stage 6 Grade: 0 %
Stage 6 HR: 78 {beats}/min
Stage 6 Speed: 0 mph
TID: 0.99

## 2016-08-21 MED ORDER — REGADENOSON 0.4 MG/5ML IV SOLN
0.4000 mg | Freq: Once | INTRAVENOUS | Status: AC
  Administered 2016-08-21: 0.4 mg via INTRAVENOUS

## 2016-08-21 MED ORDER — TECHNETIUM TC 99M TETROFOSMIN IV KIT
33.0000 | PACK | Freq: Once | INTRAVENOUS | Status: AC | PRN
  Administered 2016-08-21: 33 via INTRAVENOUS
  Filled 2016-08-21: qty 33

## 2016-08-21 MED ORDER — TECHNETIUM TC 99M TETROFOSMIN IV KIT
10.6000 | PACK | Freq: Once | INTRAVENOUS | Status: AC | PRN
  Administered 2016-08-21: 10.6 via INTRAVENOUS
  Filled 2016-08-21: qty 11

## 2016-08-23 ENCOUNTER — Encounter (HOSPITAL_COMMUNITY)
Admission: RE | Admit: 2016-08-23 | Discharge: 2016-08-23 | Disposition: A | Discharge status: Home / Self Care | Payer: Self-pay | Source: Ambulatory Visit | Attending: Cardiovascular Disease | Admitting: Cardiovascular Disease

## 2016-08-23 ENCOUNTER — Ambulatory Visit (INDEPENDENT_AMBULATORY_CARE_PROVIDER_SITE_OTHER): Payer: Medicare Other | Admitting: *Deleted

## 2016-08-23 DIAGNOSIS — I639 Cerebral infarction, unspecified: Secondary | ICD-10-CM | POA: Diagnosis not present

## 2016-08-26 ENCOUNTER — Encounter (HOSPITAL_COMMUNITY)
Admission: RE | Admit: 2016-08-26 | Discharge: 2016-08-26 | Disposition: A | Discharge status: Home / Self Care | Payer: Self-pay | Source: Ambulatory Visit | Attending: Cardiovascular Disease | Admitting: Cardiovascular Disease

## 2016-08-26 NOTE — Progress Notes (Signed)
Carelink Summary Report / Loop Recorder 

## 2016-08-28 ENCOUNTER — Encounter: Payer: Self-pay | Admitting: Neurology

## 2016-08-28 ENCOUNTER — Ambulatory Visit (INDEPENDENT_AMBULATORY_CARE_PROVIDER_SITE_OTHER): Payer: Medicare Other | Admitting: Neurology

## 2016-08-28 ENCOUNTER — Encounter (HOSPITAL_COMMUNITY): Payer: Self-pay

## 2016-08-28 VITALS — BP 107/55 | HR 51 | Ht 64.0 in | Wt 150.4 lb

## 2016-08-28 DIAGNOSIS — I639 Cerebral infarction, unspecified: Secondary | ICD-10-CM

## 2016-08-28 DIAGNOSIS — I63421 Cerebral infarction due to embolism of right anterior cerebral artery: Secondary | ICD-10-CM

## 2016-08-28 NOTE — Progress Notes (Signed)
[facility] [address] [place]. [place] [address] [phone]       OFFICE FOLLOW-UP NOTE  [name] Date of Birth:  23-Feb-1926 Medical Record Number:  [id number]   HPI: 83 year Caucasian lady seen today for first office follow-up visit following hospital admission for stroke in May 2018. She is accompanied by her daughter. History is obtained from both of them as well as review of electronic medical records and have personally reviewed imaging. films and stroke work up. Stacy [name] is a 83 y.o. female with a history of right ICA stenosis who presents with left leg weakness. He states that it is coming gone over the course of the evening, for scoring around 6:30 PM. She states that most recently, it happened even while she was in the ER after she got up to go the bathroom. Does not present when laying down.LKW: 6:30 PM 06/22/16. tpa given?: no, symptoms resolved, mild symptoms. MRI scan of the brain shows small acute infarct involving anterior part of the body of corpus callosum extending into the periventricular white matter. Transthoracic echo showed normal ejection fraction slightly diminished at 40-45% with a candidate for Zometa PICA and anterior apical myocardium. MRA of the brain showed no large vessel stenosis or occlusion. Carotid ultrasound was unremarkable. Lower extremity venous Doppler was negative for DVT. LDL cholesterol was 37 mg percent. Hemoglobin A1c was 7.2. Patient was previously on aspirin and Plavix which was continued due to history of cardiac stents. Patient's leg weakness improved and therapy recommended no outpatient needs. Patient had transesophageal echocardiogram done which did not show cardiac source of embolism. Loop recorder was inserted and on 08/02/16 paroxysmal atrial fibrillation was found for 7 hours. Patient was switched from aspirin and Plavix to eliquis. She is tolerating it well without bruising or bleeding but has been complaining of  generalized itching which she seems to become winces from the eliquis. She has seen Dr. Excell Seltzer cardiologist and had nuclear stress test done which showed no further progression of cardiac ischemia and she was advised to stay on eliquis alone. She does complain of back pain as well as sciatica in the right leg following a fall which occurred 6 days after hospital discharge from his recent stroke. She may need epidural steroid injection in the near future. She states her blood pressure well controlled today it is 107/55. She is tolerating Crestor well without any side effects   ROS:   14 system review of systems is positive for  hearing loss, itching, snoring, joint pain, aching muscles, insomnia, sleepiness, snoring, low back pain, sciatica and all other systems negative  PMH:  Past Medical History:  Diagnosis Date  . Anxiety   . Arthritis    Cervical DDD C5-6 and C6-7  . Atheroembolism   . Blue toe syndrome (HCC)   . CAD (coronary artery disease)   . Cancer (HCC)    skin BCC  Forehead and  SCC Left Lower Leg  . Carotid artery occlusion   . Chronic kidney disease, stage III (moderate)   . DDD (degenerative disc disease)   . Depression   . DM (diabetes mellitus) (HCC)   . Gall stones   . Gallstones   . GERD (gastroesophageal reflux disease)   . Heart murmur   . Heart murmur   . Hemorrhoid   . Hemorrhoids   . Hiatal hernia   . History of shingles   . Hyperlipidemia   . Hypertension   . Leg  pain   . Mild incontinence   . Multiple thyroid nodules    checked every year  . Myocardial infarction ([facility]) 03/15/95  . Osteopenia   . Peptic ulcer disease   . Stomach cancer ([facility]) 1980  . Stroke ([facility])   . Thyroid nodule   . Vision abnormalities     Social History:  Social History   Social History  . Marital status: Widowed    Spouse name: N/A  . Number of children: 1  . Years of education: N/A   Occupational History  . Retired Retired    [employer]   Social History Main  Topics  . Smoking status: Former Smoker    Packs/day: 1.00    Years: 54.00    Types: Cigarettes    Quit date: 10/05/1998  . Smokeless tobacco: Never Used  . Alcohol use No  . Drug use: No  . Sexual activity: No   Other Topics Concern  . Not on file   Social History Narrative  . No narrative on file    Medications:   Current Outpatient Prescriptions on File Prior to Visit  Medication Sig Dispense Refill  . ACCU-CHEK SMARTVIEW test strip 1 each by Other route as needed for other (blood sugar test strips).   11  . acetaminophen (TYLENOL) 325 MG tablet Take 325 mg by mouth every 6 (six) hours as needed for mild pain or moderate pain.    Marland Kitchen albuterol (PROVENTIL HFA;VENTOLIN HFA) 108 (90 Base) MCG/ACT inhaler Inhale 1-2 puffs into the lungs every 6 (six) hours as needed for wheezing or shortness of breath. 1 Inhaler 0  . apixaban (ELIQUIS) 5 MG TABS tablet Take 1 tablet (5 mg total) by mouth 2 (two) times daily. 60 tablet 0  . benzonatate (TESSALON) 100 MG capsule Take 100 mg by mouth 3 (three) times daily as needed for cough.  2  . Calcium Carbonate Antacid (TUMS PO) Take 1 Dose by mouth as directed.     . cetirizine (ZYRTEC) 10 MG tablet Take 10 mg by mouth daily as needed for allergies.    . cholecalciferol (VITAMIN D) 400 UNITS TABS Take 400 Units by mouth daily.     . Coenzyme Q10 (COQ-10 PO) Take 50 mg by mouth daily. 1 tab po qd    . conjugated estrogens (PREMARIN) vaginal cream Place 1 g vaginally once a week. .625 mg once weekly    . dexlansoprazole (DEXILANT) 60 MG capsule Take 60 mg by mouth daily as needed (for acid reflux). Take 1 tablet ( Dexilant DR ) by mouth once daily    . diphenhydrAMINE (BENADRYL) 25 mg capsule Take 25 mg by mouth as needed for allergies.     Marland Kitchen gabapentin (NEURONTIN) 100 MG capsule Take 100 mg by mouth 2 (two) times daily.     . hydrochlorothiazide (MICROZIDE) 12.5 MG capsule Take 1 capsule (12.5 mg total) by mouth daily. 90 capsule 3  . hydrocortisone  2.5 % cream Apply 1 application topically daily as needed (for itching).   1  . Melatonin 5 MG CAPS Take 1 capsule by mouth at bedtime as needed (sleep).     . menthol-zinc oxide (GOLD BOND) powder Apply 1 application topically 2 (two) times daily as needed (freshness.).    Marland Kitchen metoprolol succinate (TOPROL-XL) 25 MG 24 hr tablet Take 1 tablet (25 mg total) by mouth daily. 90 tablet 3  . Multiple Vitamins-Minerals (ICAPS AREDS 2 PO) Take by mouth.    . NEOMYCIN-POLYMYXIN-HYDROCORTISONE (CORTISPORIN) 1 %  SOLN otic solution Place 2 drops into both ears as directed.   3  . nitroGLYCERIN (NITROSTAT) 0.4 MG SL tablet Place 1 tablet (0.4 mg total) under the tongue every 5 (five) minutes as needed for chest pain. 25 tablet 2  . Polyethylene Glycol 400 (BLINK TEARS OP) Place 1 drop into both eyes daily as needed (for dry eye). Reported on 05/05/2015    . polyethylene glycol powder (GLYCOLAX/MIRALAX) powder Take 17 g by mouth daily. Reported on 06/12/2015    . ramipril (ALTACE) 2.5 MG capsule TAKE 1 CAPSULE (2.5 MG TOTAL) BY MOUTH 2 (TWO) TIMES DAILY. 180 capsule 3  . rosuvastatin (CRESTOR) 10 MG tablet Take 1 tablet (10 mg total) by mouth daily. 90 tablet 3  . sertraline (ZOLOFT) 25 MG tablet Take 25 mg by mouth daily.     Marland Kitchen Spacer/Aero-Holding Chambers (AEROCHAMBER PLUS WITH MASK) inhaler Use as instructed 1 each 2  . trolamine salicylate (ASPERCREME) 10 % cream Apply 1 application topically as needed for muscle pain.     No current facility-administered medications on file prior to visit.     Allergies:   Allergies  Allergen Reactions  . Iohexol Other (See Comments)     Desc: HIVES- 13 HR PRE-MEDS ARE REQUIRED-ASM- 03/21/05,SULFA,ADHESIVE TAPE   . Sulfonamide Derivatives Hives    REACTION: hives  . Latex Other (See Comments)    Adhesive tape and EKG adhesive leads to rash  . Tranilast Hives    Investigational medication    Physical Exam General: well developed, well nourished elderly Caucasian  lady, seated, in no evident distress Head: head normocephalic and atraumatic.  Neck: supple with no carotid or supraclavicular bruits Cardiovascular: regular rate and rhythm, no murmurs Musculoskeletal: no deformity Skin:  no rash/petichiae Vascular:  Normal pulses all extremities Vitals:   08/28/16 0853  BP: (!) 107/55  Pulse: (!) 51   Neurologic Exam Mental Status: Awake and fully alert. Oriented to place and time. Recent and remote memory intact. Attention span, concentration and fund of knowledge appropriate. Mood and affect appropriate.  Cranial Nerves: Fundoscopic exam not done Pupils equal, briskly reactive to light. Extraocular movements full without nystagmus. Visual fields full to confrontation. Hearing intact. Facial sensation intact. Face, tongue, palate moves normally and symmetrically.  Motor: Normal bulk and tone. Normal strength in all tested extremity muscles. Sensory.: intact to touch ,pinprick .position and vibratory sensation.  Coordination: Rapid alternating movements normal in all extremities. Finger-to-nose and heel-to-shin performed accurately bilaterally. Gait and Station: Arises from chair with mild difficulty. Stance is normal. Gait demonstrates normal stride length and balance but favors her back due to back pain . Able to heel, toe and tandem walk with slight difficulty.  Reflexes: 1+ and symmetric. Toes downgoing.   NIHSS  0 Modified Rankin  1   ASSESSMENT: 55 year Caucasian lady with embolic right anterior cerebral artery branch infarct in May 2018 secondary to paroxysmal atrial fibrillation. She is doing clinically quite well with full recovery    PLAN: I had a long d/w patient and her daughter about her recent embolic stroke,atrial fibrillation risk for recurrent stroke/TIAs, personally independently reviewed imaging studies and stroke evaluation results and answered questions.Continue Eliquis (apixaban) daily  for secondary stroke prevention and  maintain strict control of hypertension with blood pressure goal below 130/90, diabetes with hemoglobin A1c goal below 6.5% and lipids with LDL cholesterol goal below 70 mg/dL. I also advised the patient to eat a healthy diet with plenty of whole grains, cereals, fruits  and vegetables, exercise regularly and maintain ideal body weight . The patient may need epidural steroid back injections for her shy decline. I recommend she wait at least 3-6 months which is a high risk. For stroke. She may need to hold eliquis for 3 days prior to the procedure and to restart it after the procedure when safe. I also advised her to discuss with her cardiologist changing eliquis to alternative anticoagulant given her new complaint of generalized itching Followup in the future with my nurse practitioner in 6 months or call earlier if necessary. Greater than 50% of time during this 25 minute visit was spent on counseling,explanation of diagnosis of embolic stroke and paroxysmal atrial fibrillation, planning of further management, discussion with patient and family and coordination of care Antony Contras, MD  [facility] [address] [place], [place] [address]  Phone [phone] Fax [phone] Note: This document was prepared with digital dictation and possible smart phrase technology. Any transcriptional errors that result from this process are unintentional

## 2016-08-28 NOTE — Patient Instructions (Signed)
I had a long d/w patient and her daughter about her recent embolic stroke,atrial fibrillation risk for recurrent stroke/TIAs, personally independently reviewed imaging studies and stroke evaluation results and answered questions.Continue Eliquis (apixaban) daily  for secondary stroke prevention and maintain strict control of hypertension with blood pressure goal below 130/90, diabetes with hemoglobin A1c goal below 6.5% and lipids with LDL cholesterol goal below 70 mg/dL. I also advised the patient to eat a healthy diet with plenty of whole grains, cereals, fruits and vegetables, exercise regularly and maintain ideal body weight . The patient may need epidural steroid back injections for her shy decline. I recommend she wait at least 3-6 months which is a high risk. For stroke. She may need to hold eliquis for 3 days prior to the procedure and to restart it after the procedure when safe. I also advised her to discuss with her cardiologist changing eliquis to alternative anticoagulant given her new complaint of generalized itching Followup in the future with my nurse practitioner in 6 months or call earlier if necessary  Stroke Prevention Some medical conditions and behaviors are associated with an increased chance of having a stroke. You may prevent a stroke by making healthy choices and managing medical conditions. How can I reduce my risk of having a stroke?  Stay physically active. Get at least 30 minutes of activity on most or all days.  Do not smoke. It may also be helpful to avoid exposure to secondhand smoke.  Limit alcohol use. Moderate alcohol use is considered to be: ? No more than 2 drinks per day for men. ? No more than 1 drink per day for nonpregnant women.  Eat healthy foods. This involves: ? Eating 5 or more servings of fruits and vegetables a day. ? Making dietary changes that address high blood pressure (hypertension), high cholesterol, diabetes, or obesity.  Manage your  cholesterol levels. ? Making food choices that are high in fiber and low in saturated fat, trans fat, and cholesterol may control cholesterol levels. ? Take any prescribed medicines to control cholesterol as directed by your health care provider.  Manage your diabetes. ? Controlling your carbohydrate and sugar intake is recommended to manage diabetes. ? Take any prescribed medicines to control diabetes as directed by your health care provider.  Control your hypertension. ? Making food choices that are low in salt (sodium), saturated fat, trans fat, and cholesterol is recommended to manage hypertension. ? Ask your health care provider if you need treatment to lower your blood pressure. Take any prescribed medicines to control hypertension as directed by your health care provider. ? If you are 57-46 years of age, have your blood pressure checked every 3-5 years. If you are 14 years of age or older, have your blood pressure checked every year.  Maintain a healthy weight. ? Reducing calorie intake and making food choices that are low in sodium, saturated fat, trans fat, and cholesterol are recommended to manage weight.  Stop drug abuse.  Avoid taking birth control pills. ? Talk to your health care provider about the risks of taking birth control pills if you are over 60 years old, smoke, get migraines, or have ever had a blood clot.  Get evaluated for sleep disorders (sleep apnea). ? Talk to your health care provider about getting a sleep evaluation if you snore a lot or have excessive sleepiness.  Take medicines only as directed by your health care provider. ? For some people, aspirin or blood thinners (anticoagulants) are helpful in  reducing the risk of forming abnormal blood clots that can lead to stroke. If you have the irregular heart rhythm of atrial fibrillation, you should be on a blood thinner unless there is a good reason you cannot take them. ? Understand all your medicine  instructions.  Make sure that other conditions (such as anemia or atherosclerosis) are addressed. Get help right away if:  You have sudden weakness or numbness of the face, arm, or leg, especially on one side of the body.  Your face or eyelid droops to one side.  You have sudden confusion.  You have trouble speaking (aphasia) or understanding.  You have sudden trouble seeing in one or both eyes.  You have sudden trouble walking.  You have dizziness.  You have a loss of balance or coordination.  You have a sudden, severe headache with no known cause.  You have new chest pain or an irregular heartbeat. Any of these symptoms may represent a serious problem that is an emergency. Do not wait to see if the symptoms will go away. Get medical help at once. Call your local emergency services (911 in [place] Do not drive yourself to the hospital. This information is not intended to replace advice given to you by your health care provider. Make sure you discuss any questions you have with your health care provider. Document Released: 03/07/2004 Document Revised: 07/06/2015 Document Reviewed: 07/31/2012 Elsevier Interactive Patient Education  2017 [employer].

## 2016-08-29 ENCOUNTER — Other Ambulatory Visit: Payer: Self-pay

## 2016-08-29 ENCOUNTER — Ambulatory Visit (HOSPITAL_COMMUNITY)
Admission: RE | Admit: 2016-08-29 | Discharge: 2016-08-29 | Disposition: A | Discharge status: Home / Self Care | Payer: Medicare Other | Source: Ambulatory Visit | Attending: Nurse Practitioner | Admitting: Nurse Practitioner

## 2016-08-29 ENCOUNTER — Encounter (HOSPITAL_COMMUNITY): Payer: Self-pay | Admitting: Nurse Practitioner

## 2016-08-29 VITALS — BP 126/64 | HR 63 | Ht 64.0 in | Wt 151.0 lb

## 2016-08-29 DIAGNOSIS — Z882 Allergy status to sulfonamides status: Secondary | ICD-10-CM | POA: Diagnosis not present

## 2016-08-29 DIAGNOSIS — K219 Gastro-esophageal reflux disease without esophagitis: Secondary | ICD-10-CM | POA: Diagnosis not present

## 2016-08-29 DIAGNOSIS — E785 Hyperlipidemia, unspecified: Secondary | ICD-10-CM | POA: Insufficient documentation

## 2016-08-29 DIAGNOSIS — I2510 Atherosclerotic heart disease of native coronary artery without angina pectoris: Secondary | ICD-10-CM | POA: Insufficient documentation

## 2016-08-29 DIAGNOSIS — Z823 Family history of stroke: Secondary | ICD-10-CM | POA: Insufficient documentation

## 2016-08-29 DIAGNOSIS — Z8249 Family history of ischemic heart disease and other diseases of the circulatory system: Secondary | ICD-10-CM | POA: Insufficient documentation

## 2016-08-29 DIAGNOSIS — Z85828 Personal history of other malignant neoplasm of skin: Secondary | ICD-10-CM | POA: Insufficient documentation

## 2016-08-29 DIAGNOSIS — Z833 Family history of diabetes mellitus: Secondary | ICD-10-CM | POA: Insufficient documentation

## 2016-08-29 DIAGNOSIS — Z8673 Personal history of transient ischemic attack (TIA), and cerebral infarction without residual deficits: Secondary | ICD-10-CM | POA: Diagnosis not present

## 2016-08-29 DIAGNOSIS — Z9889 Other specified postprocedural states: Secondary | ICD-10-CM | POA: Diagnosis not present

## 2016-08-29 DIAGNOSIS — I6529 Occlusion and stenosis of unspecified carotid artery: Secondary | ICD-10-CM | POA: Insufficient documentation

## 2016-08-29 DIAGNOSIS — I4891 Unspecified atrial fibrillation: Secondary | ICD-10-CM | POA: Diagnosis not present

## 2016-08-29 DIAGNOSIS — Z91040 Latex allergy status: Secondary | ICD-10-CM | POA: Diagnosis not present

## 2016-08-29 DIAGNOSIS — M8580 Other specified disorders of bone density and structure, unspecified site: Secondary | ICD-10-CM | POA: Diagnosis not present

## 2016-08-29 DIAGNOSIS — I480 Paroxysmal atrial fibrillation: Secondary | ICD-10-CM | POA: Diagnosis not present

## 2016-08-29 DIAGNOSIS — N183 Chronic kidney disease, stage 3 (moderate): Secondary | ICD-10-CM | POA: Insufficient documentation

## 2016-08-29 DIAGNOSIS — Z8619 Personal history of other infectious and parasitic diseases: Secondary | ICD-10-CM | POA: Diagnosis not present

## 2016-08-29 DIAGNOSIS — E1122 Type 2 diabetes mellitus with diabetic chronic kidney disease: Secondary | ICD-10-CM | POA: Diagnosis not present

## 2016-08-29 DIAGNOSIS — I252 Old myocardial infarction: Secondary | ICD-10-CM | POA: Diagnosis not present

## 2016-08-29 DIAGNOSIS — I129 Hypertensive chronic kidney disease with stage 1 through stage 4 chronic kidney disease, or unspecified chronic kidney disease: Secondary | ICD-10-CM | POA: Insufficient documentation

## 2016-08-29 DIAGNOSIS — Z9849 Cataract extraction status, unspecified eye: Secondary | ICD-10-CM | POA: Insufficient documentation

## 2016-08-29 DIAGNOSIS — Z820 Family history of epilepsy and other diseases of the nervous system: Secondary | ICD-10-CM | POA: Diagnosis not present

## 2016-08-29 DIAGNOSIS — Z8042 Family history of malignant neoplasm of prostate: Secondary | ICD-10-CM | POA: Diagnosis not present

## 2016-08-29 DIAGNOSIS — Z7902 Long term (current) use of antithrombotics/antiplatelets: Secondary | ICD-10-CM | POA: Diagnosis not present

## 2016-08-29 DIAGNOSIS — Z87891 Personal history of nicotine dependence: Secondary | ICD-10-CM | POA: Insufficient documentation

## 2016-08-29 DIAGNOSIS — Z888 Allergy status to other drugs, medicaments and biological substances status: Secondary | ICD-10-CM | POA: Insufficient documentation

## 2016-08-29 DIAGNOSIS — Z955 Presence of coronary angioplasty implant and graft: Secondary | ICD-10-CM | POA: Insufficient documentation

## 2016-08-29 DIAGNOSIS — Z85028 Personal history of other malignant neoplasm of stomach: Secondary | ICD-10-CM | POA: Insufficient documentation

## 2016-08-29 LAB — BASIC METABOLIC PANEL
ANION GAP: 9 (ref 5–15)
BUN: 13 mg/dL (ref 6–20)
CHLORIDE: 101 mmol/L (ref 101–111)
CO2: 27 mmol/L (ref 22–32)
Calcium: 9.6 mg/dL (ref 8.9–10.3)
Creatinine, Ser: 0.96 mg/dL (ref 0.44–1.00)
GFR calc Af Amer: 59 mL/min — ABNORMAL LOW (ref >60)
GFR, EST NON AFRICAN AMERICAN: 51 mL/min — AB (ref >60)
Glucose, Bld: 111 mg/dL — ABNORMAL HIGH (ref 65–99)
POTASSIUM: 3.9 mmol/L (ref 3.5–5.1)
SODIUM: 137 mmol/L (ref 135–145)

## 2016-08-29 LAB — CUP PACEART REMOTE DEVICE CHECK
Implantable Pulse Generator Implant Date: 20180514
MDC IDC SESS DTM: 20180713214316

## 2016-08-29 LAB — CBC
HEMATOCRIT: 46.1 % — AB (ref 36.0–46.0)
HEMOGLOBIN: 14.9 g/dL (ref 12.0–15.0)
MCH: 29 pg (ref 26.0–34.0)
MCHC: 32.3 g/dL (ref 30.0–36.0)
MCV: 89.9 fL (ref 78.0–100.0)
Platelets: 219 10*3/uL (ref 150–400)
RBC: 5.13 MIL/uL — ABNORMAL HIGH (ref 3.87–5.11)
RDW: 14 % (ref 11.5–15.5)
WBC: 7.7 10*3/uL (ref 4.0–10.5)

## 2016-08-29 MED ORDER — APIXABAN 5 MG PO TABS: 5.0000 mg | ORAL_TABLET | Freq: Two times a day (BID) | ORAL | 1 refills | Status: DC

## 2016-08-29 NOTE — Progress Notes (Signed)
Primary Care Physician: Lillard Anes, MD Referring Physician: Dr. Amil Amen Stacy [name] is a 83 y.o. female with a h/o cryptogenic stroke 5/12, paroxysmal atrial fib  that is in the afib clinic for further evaluation. She had a Linq inserted at time of CVA and recently afib was documented. She is here to discuss anticoagulation. She has been stable on asa and plavix for years for CAD. Denies any bleeding history. Steady on her feet. Dr. Burt Knack ok'ed stopping plavix and asa to reduse bleeding risk to allow for pt to take eliquis 5 mg bid.  F/u 7/19 in afib clinic. She has done well with stopping asa/plavix and starting eliquis 5 mg bid.Mild itching of chest and back without rash. States it only itches at times, and is not uncomfortable  enough to stop drug. No swelling of airway. Paceart reviewed as of report 7/13- she had afib burden of 1.1%. # 3 episodes, longest of 7 hours for which pt was asymptomatic.  SR Today, she denies symptoms of palpitations, chest pain, shortness of breath, orthopnea, PND, lower extremity edema, dizziness, presyncope, syncope, or neurologic sequela. The patient is tolerating medications without difficulties and is otherwise without complaint today.   Past Medical History:  Diagnosis Date  . Anxiety   . Arthritis    Cervical DDD C5-6 and C6-7  . Atheroembolism   . Blue toe syndrome ([place])   . CAD (coronary artery disease)   . Cancer ([place])    skin BCC  Forehead and  SCC Left Lower Leg  . Carotid artery occlusion   . Chronic kidney disease, stage III (moderate)   . DDD (degenerative disc disease)   . Depression   . DM (diabetes mellitus) ([place])   . Gall stones   . Gallstones   . GERD (gastroesophageal reflux disease)   . Heart murmur   . Heart murmur   . Hemorrhoid   . Hemorrhoids   . Hiatal hernia   . History of shingles   . Hyperlipidemia   . Hypertension   . Leg pain   . Mild incontinence   . Multiple thyroid nodules    checked every  year  . Myocardial infarction ([place]) 03/15/95  . Osteopenia   . Peptic ulcer disease   . Stomach cancer ([place]) 1980  . Stroke ([place])   . Thyroid nodule   . Vision abnormalities    Past Surgical History:  Procedure Laterality Date  . 3 epidural steroid injections    . APPENDECTOMY    . Basal Cell Cancer Removal     forehead  . CATARACT EXTRACTION    . CATARACT EXTRACTION  12/29/2003  . CORONARY ANGIOPLASTY  03-15-1995  . CORONARY ANGIOPLASTY WITH STENT PLACEMENT  05-19-1998  . CORONARY STENT PLACEMENT  2000  . DILATION AND CURETTAGE OF UTERUS    . ENDARTERECTOMY Right 04/13/2015   Procedure: ENDARTERECTOMY CAROTID;  Surgeon: Serafina Mitchell, MD;  Location: [facility] OR;  Service: Vascular;  Laterality: Right;  . ENDOMETRIAL BIOPSY    . EYE SURGERY    . heart catherization    . HEMORRHOID SURGERY    . INCISIONAL HERNIA REPAIR    . INGUINAL HERNIA REPAIR    . LOOP RECORDER INSERTION N/A 06/24/2016   Procedure: Loop Recorder Insertion;  Surgeon: Deboraha Sprang, MD;  Location: [facility] LAB;  Service: Cardiovascular;  Laterality: N/A;  . PATCH ANGIOPLASTY Right 04/13/2015   Procedure: PATCH ANGIOPLASTY;  Surgeon: Serafina Mitchell, MD;  Location: [facility] OR;  Service: Vascular;  Laterality: Right;  . PTCA    . SQUAMOUS CELL CARCINOMA EXCISION     left lower leg  . TEE WITHOUT CARDIOVERSION N/A 06/24/2016   Procedure: TRANSESOPHAGEAL ECHOCARDIOGRAM (TEE);  Surgeon: Dorothy Spark, MD;  Location: [facility] ENDOSCOPY;  Service: Cardiovascular;  Laterality: N/A;  . TONSILLECTOMY      Current Outpatient Prescriptions  Medication Sig Dispense Refill  . ACCU-CHEK SMARTVIEW test strip 1 each by Other route as needed for other (blood sugar test strips).   11  . acetaminophen (TYLENOL) 325 MG tablet Take 325 mg by mouth every 6 (six) hours as needed for mild pain or moderate pain.    Marland Kitchen apixaban (ELIQUIS) 5 MG TABS tablet Take 1 tablet (5 mg total) by mouth 2 (two) times daily. 60 tablet 1  . cetirizine  (ZYRTEC) 10 MG tablet Take 10 mg by mouth daily as needed for allergies.    . cholecalciferol (VITAMIN D) 400 UNITS TABS Take 400 Units by mouth daily.     . Coenzyme Q10 (COQ-10 PO) Take 50 mg by mouth daily. 1 tab po qd    . conjugated estrogens (PREMARIN) vaginal cream Place 1 g vaginally once a week. .625 mg once weekly    . dexlansoprazole (DEXILANT) 60 MG capsule Take 60 mg by mouth daily as needed (for acid reflux). Take 1 tablet ( Dexilant DR ) by mouth once daily    . diphenhydrAMINE (BENADRYL) 25 mg capsule Take 25 mg by mouth as needed for allergies.     Marland Kitchen docusate sodium (COLACE) 100 MG capsule Take 100 mg by mouth daily as needed.     . gabapentin (NEURONTIN) 100 MG capsule Take 100 mg by mouth 2 (two) times daily.     . hydrochlorothiazide (MICROZIDE) 12.5 MG capsule Take 1 capsule (12.5 mg total) by mouth daily. 90 capsule 3  . hydrocortisone 2.5 % cream Apply 1 application topically daily as needed (for itching).   1  . Melatonin 5 MG CAPS Take 1 capsule by mouth at bedtime as needed (sleep).     . menthol-zinc oxide (GOLD BOND) powder Apply 1 application topically 2 (two) times daily as needed (freshness.).    Marland Kitchen metoprolol succinate (TOPROL-XL) 25 MG 24 hr tablet Take 1 tablet (25 mg total) by mouth daily. 90 tablet 3  . Multiple Vitamins-Minerals (ICAPS AREDS 2 PO) Take by mouth.    . nitroGLYCERIN (NITROSTAT) 0.4 MG SL tablet Place 1 tablet (0.4 mg total) under the tongue every 5 (five) minutes as needed for chest pain. 25 tablet 2  . Polyethylene Glycol 400 (BLINK TEARS OP) Place 1 drop into both eyes daily as needed (for dry eye). Reported on 05/05/2015    . polyethylene glycol powder (GLYCOLAX/MIRALAX) powder Take 17 g by mouth daily. Reported on 06/12/2015    . ramipril (ALTACE) 2.5 MG capsule TAKE 1 CAPSULE (2.5 MG TOTAL) BY MOUTH 2 (TWO) TIMES DAILY. 180 capsule 3  . rosuvastatin (CRESTOR) 10 MG tablet Take 1 tablet (10 mg total) by mouth daily. 90 tablet 3  . sertraline  (ZOLOFT) 25 MG tablet Take 25 mg by mouth daily.     Marland Kitchen trolamine salicylate (ASPERCREME) 10 % cream Apply 1 application topically as needed for muscle pain.    Marland Kitchen albuterol (PROVENTIL HFA;VENTOLIN HFA) 108 (90 Base) MCG/ACT inhaler Inhale 1-2 puffs into the lungs every 6 (six) hours as needed for wheezing or shortness of breath. (Patient not taking: Reported on  08/29/2016) 1 Inhaler 0  . NEOMYCIN-POLYMYXIN-HYDROCORTISONE (CORTISPORIN) 1 % SOLN otic solution Place 2 drops into both ears as directed.   3   No current facility-administered medications for this encounter.     Allergies  Allergen Reactions  . Iohexol Other (See Comments)     Desc: HIVES- 13 HR PRE-MEDS ARE REQUIRED-ASM- 03/21/05,SULFA,ADHESIVE TAPE   . Sulfonamide Derivatives Hives    REACTION: hives  . Latex Other (See Comments)    Adhesive tape and EKG adhesive leads to rash  . Tranilast Hives    Investigational medication    Social History   Social History  . Marital status: Widowed    Spouse name: N/A  . Number of children: 1  . Years of education: N/A   Occupational History  . Retired Retired    [employer]   Social History Main Topics  . Smoking status: Former Smoker    Packs/day: 1.00    Years: 54.00    Types: Cigarettes    Quit date: 10/05/1998  . Smokeless tobacco: Never Used  . Alcohol use No  . Drug use: No  . Sexual activity: No   Other Topics Concern  . Not on file   Social History Narrative  . No narrative on file    Family History  Problem Relation Age of Onset  . Diabetes Father   . Prostate cancer Father        meta to colon  . Cancer Father        Prostate  . Parkinsonism Mother   . Stroke Paternal Uncle   . Hypertension Paternal Uncle   . Heart attack Neg Hx     ROS- All systems are reviewed and negative except as per the HPI above  Physical Exam: Vitals:   08/29/16 1539  BP: 126/64  Pulse: 63  Weight: 151 lb (68.5 kg)  Height: _0  (1.626 m)   Wt Readings from  Last 3 Encounters:  08/29/16 151 lb (68.5 kg)  08/28/16 150 lb 6.4 oz (68.2 kg)  08/21/16 149 lb (67.6 kg)    Labs: Lab Results  Component Value Date   NA 144 06/24/2016   K 4.3 06/24/2016   CL 108 06/24/2016   CO2 29 06/24/2016   GLUCOSE 128 (H) 06/24/2016   BUN 15 06/24/2016   CREATININE 1.01 (H) 06/24/2016   CALCIUM 8.6 (L) 06/24/2016   Lab Results  Component Value Date   INR 1.06 04/05/2015   Lab Results  Component Value Date   CHOL 102 06/23/2016   HDL 42 06/23/2016   LDLCALC 37 06/23/2016   TRIG 116 06/23/2016     GEN- The patient is well appearing, alert and oriented x 3 today.   Head- normocephalic, atraumatic Eyes-  Sclera clear, conjunctiva pink Ears- hearing intact Oropharynx- clear Neck- supple, no JVP Lymph- no cervical lymphadenopathy Lungs- Clear to ausculation bilaterally, normal work of breathing Heart- Regular rate and rhythm, no murmurs, rubs or gallops, PMI not laterally displaced GI- soft, NT, ND, + BS Extremities- no clubbing, cyanosis, or edema MS- no significant deformity or atrophy Skin- no rash or lesion Psych- euthymic mood, full affect Neuro- strength and sensation are intact  EKG- SR with PAC's RBBB,  v rate at 63 bpm, qrs int 120 ms, qtc 425 ms Epic records reviewed Linq report reviewed    Assessment and Plan: 1.  Asymptomatic Paroxysmal afib in the setting of acute CVA  Continue eliquis 5 mg bid(age over 80, but creatinine less than  1.5 (at 1.01), and weight over 60 kg),  but if itching worsens may have to think about changing drug Bleeding risk reviewed with no known bleeding risk, steady on feet Off asa/plavix Bleeding precautions discussed Continue metoprolol for afib as well as CAD, I don't think afib burden warrants increase in BB and I am fearful of causing brady with HR in the low 60's Continue linq for afib burden but pt is currently asymptomatic Bmet/cbc today  F/u per PCP, Dr. Burt Knack, Dr. Leonie Man, device clinic    afib clinic as needed  Geroge Baseman. Crystale Giannattasio, [facility] [address] [place], [place] [address] [phone]

## 2016-08-30 ENCOUNTER — Encounter (HOSPITAL_COMMUNITY): Payer: Self-pay

## 2016-09-02 ENCOUNTER — Encounter (HOSPITAL_COMMUNITY)
Admission: RE | Admit: 2016-09-02 | Discharge: 2016-09-02 | Disposition: A | Discharge status: Home / Self Care | Payer: Self-pay | Source: Ambulatory Visit | Attending: Cardiovascular Disease | Admitting: Cardiovascular Disease

## 2016-09-04 ENCOUNTER — Encounter (HOSPITAL_COMMUNITY)
Admission: RE | Admit: 2016-09-04 | Discharge: 2016-09-04 | Disposition: A | Discharge status: Home / Self Care | Payer: Self-pay | Source: Ambulatory Visit | Attending: Cardiovascular Disease | Admitting: Cardiovascular Disease

## 2016-09-06 ENCOUNTER — Encounter (HOSPITAL_COMMUNITY)
Admission: RE | Admit: 2016-09-06 | Discharge: 2016-09-06 | Disposition: A | Discharge status: Home / Self Care | Payer: Self-pay | Source: Ambulatory Visit | Attending: Cardiovascular Disease | Admitting: Cardiovascular Disease

## 2016-09-09 ENCOUNTER — Encounter (HOSPITAL_COMMUNITY)
Admission: RE | Admit: 2016-09-09 | Discharge: 2016-09-09 | Disposition: A | Discharge status: Home / Self Care | Payer: Self-pay | Source: Ambulatory Visit | Attending: Cardiovascular Disease | Admitting: Cardiovascular Disease

## 2016-09-11 ENCOUNTER — Encounter (HOSPITAL_COMMUNITY)
Admission: RE | Admit: 2016-09-11 | Discharge: 2016-09-11 | Disposition: A | Discharge status: Home / Self Care | Payer: Self-pay | Source: Ambulatory Visit | Attending: Cardiovascular Disease | Admitting: Cardiovascular Disease

## 2016-09-11 DIAGNOSIS — I2510 Atherosclerotic heart disease of native coronary artery without angina pectoris: Secondary | ICD-10-CM | POA: Insufficient documentation

## 2016-09-13 ENCOUNTER — Encounter (HOSPITAL_COMMUNITY)
Admission: RE | Admit: 2016-09-13 | Discharge: 2016-09-13 | Disposition: A | Discharge status: Home / Self Care | Payer: Self-pay | Source: Ambulatory Visit | Attending: Cardiovascular Disease | Admitting: Cardiovascular Disease

## 2016-09-16 ENCOUNTER — Encounter (HOSPITAL_COMMUNITY)
Admission: RE | Admit: 2016-09-16 | Discharge: 2016-09-16 | Disposition: A | Discharge status: Home / Self Care | Payer: Self-pay | Source: Ambulatory Visit | Attending: Cardiovascular Disease | Admitting: Cardiovascular Disease

## 2016-09-17 ENCOUNTER — Telehealth: Payer: Self-pay | Admitting: *Deleted

## 2016-09-17 DIAGNOSIS — D1801 Hemangioma of skin and subcutaneous tissue: Secondary | ICD-10-CM | POA: Diagnosis not present

## 2016-09-17 DIAGNOSIS — L570 Actinic keratosis: Secondary | ICD-10-CM | POA: Diagnosis not present

## 2016-09-17 DIAGNOSIS — Z85828 Personal history of other malignant neoplasm of skin: Secondary | ICD-10-CM | POA: Diagnosis not present

## 2016-09-17 DIAGNOSIS — L821 Other seborrheic keratosis: Secondary | ICD-10-CM | POA: Diagnosis not present

## 2016-09-17 NOTE — Telephone Encounter (Signed)
Spoke with patient regarding sending manual transmission. 3 tachy episodes, 1 available ECG appears AF. Patient states no symptoms. Manual transmission sent successfully. Advised patient will review once we receive it. Patient verbalized understanding.

## 2016-09-17 NOTE — Telephone Encounter (Signed)
Reviewed 3 tachy episodes-- ECGs appear AF vs. SVT. Will place in Dr. Olin Pia folder for review.

## 2016-09-18 ENCOUNTER — Encounter (HOSPITAL_COMMUNITY)
Admission: RE | Admit: 2016-09-18 | Discharge: 2016-09-18 | Disposition: A | Discharge status: Home / Self Care | Payer: Self-pay | Source: Ambulatory Visit | Attending: Cardiovascular Disease | Admitting: Cardiovascular Disease

## 2016-09-20 ENCOUNTER — Telehealth: Payer: Self-pay | Admitting: Internal Medicine

## 2016-09-20 ENCOUNTER — Encounter (HOSPITAL_COMMUNITY)
Admission: RE | Admit: 2016-09-20 | Discharge: 2016-09-20 | Disposition: A | Discharge status: Home / Self Care | Payer: Self-pay | Source: Ambulatory Visit | Attending: Cardiovascular Disease | Admitting: Cardiovascular Disease

## 2016-09-20 NOTE — Telephone Encounter (Signed)
Stacy [name] (daughter) is calling to find out what about her mother transmission. Please call

## 2016-09-20 NOTE — Telephone Encounter (Signed)
Spoke with patient's daughter.  She is concerned that manual transmission from earlier this week showed VT.  Advised patient's daughter that transmissions suggests episodes of SVT/AF w/RVR on 8/6 between 1108 and 1153.  Confirmed that patient is still taking Eliquis and Toprol-XL.  Patient switched timing for Toprol-XL from PM to AM earlier this week because she looked at the bottle and noticed that it said to take it in the AM.  Patient was asymptomatic with tachy episodes.  Advised that the episodes were printed for review by Dr. Caryl Comes and that we will contact her if he has any additional recommendations.  Patient's daughter is appreciative and denies additional questions or concerns at this time.

## 2016-09-23 ENCOUNTER — Ambulatory Visit (INDEPENDENT_AMBULATORY_CARE_PROVIDER_SITE_OTHER): Payer: Medicare Other | Admitting: *Deleted

## 2016-09-23 ENCOUNTER — Encounter (HOSPITAL_COMMUNITY): Payer: Self-pay

## 2016-09-23 DIAGNOSIS — Z79899 Other long term (current) drug therapy: Secondary | ICD-10-CM | POA: Diagnosis not present

## 2016-09-23 DIAGNOSIS — E042 Nontoxic multinodular goiter: Secondary | ICD-10-CM | POA: Diagnosis not present

## 2016-09-23 DIAGNOSIS — I2510 Atherosclerotic heart disease of native coronary artery without angina pectoris: Secondary | ICD-10-CM | POA: Diagnosis not present

## 2016-09-23 DIAGNOSIS — I639 Cerebral infarction, unspecified: Secondary | ICD-10-CM

## 2016-09-23 DIAGNOSIS — E119 Type 2 diabetes mellitus without complications: Secondary | ICD-10-CM | POA: Diagnosis not present

## 2016-09-23 DIAGNOSIS — M8580 Other specified disorders of bone density and structure, unspecified site: Secondary | ICD-10-CM | POA: Diagnosis not present

## 2016-09-25 ENCOUNTER — Encounter (HOSPITAL_COMMUNITY)
Admission: RE | Admit: 2016-09-25 | Discharge: 2016-09-25 | Disposition: A | Discharge status: Home / Self Care | Payer: Self-pay | Source: Ambulatory Visit | Attending: Cardiovascular Disease | Admitting: Cardiovascular Disease

## 2016-09-27 ENCOUNTER — Encounter (HOSPITAL_COMMUNITY): Payer: Self-pay

## 2016-09-28 LAB — CUP PACEART REMOTE DEVICE CHECK
Implantable Pulse Generator Implant Date: 20180514
MDC IDC SESS DTM: 20180813001100

## 2016-09-28 NOTE — Progress Notes (Signed)
Carelink summary report received. Battery status OK. Normal device function. No new symptom episodes, brady, or pause episodes. 3 tachy- 2 appear SVT, 1 appears AF w/ RVR. 3 AF 0.5% +eliquis. Monthly summary reports and ROV/PRN

## 2016-09-30 ENCOUNTER — Encounter (HOSPITAL_COMMUNITY)
Admission: RE | Admit: 2016-09-30 | Discharge: 2016-09-30 | Disposition: A | Discharge status: Home / Self Care | Payer: Self-pay | Source: Ambulatory Visit | Attending: Cardiovascular Disease | Admitting: Cardiovascular Disease

## 2016-10-02 ENCOUNTER — Encounter: Payer: Self-pay | Admitting: *Deleted

## 2016-10-02 ENCOUNTER — Encounter (HOSPITAL_COMMUNITY): Payer: Self-pay

## 2016-10-02 ENCOUNTER — Emergency Department (INDEPENDENT_AMBULATORY_CARE_PROVIDER_SITE_OTHER)
Admission: EM | Admit: 2016-10-02 | Discharge: 2016-10-02 | Disposition: A | Discharge status: Home / Self Care | Payer: Medicare Other | Source: Home / Self Care | Attending: Family Medicine | Admitting: Family Medicine

## 2016-10-02 DIAGNOSIS — B354 Tinea corporis: Secondary | ICD-10-CM | POA: Diagnosis not present

## 2016-10-02 MED ORDER — TERBINAFINE HCL 125 MG PO PACK: 125.0000 mg | PACK | Freq: Two times a day (BID) | ORAL | 0 refills | Status: DC

## 2016-10-02 MED ORDER — CLOTRIMAZOLE 1 % EX CREA: TOPICAL_CREAM | CUTANEOUS | 1 refills | Status: DC

## 2016-10-02 NOTE — ED Triage Notes (Signed)
Pt c/o itching lesions on her LT wrist x 1 wk. She also c/o lesions on her nose, face, arm, leg and arm x 1 days. She has applied benadryl cream without relief.

## 2016-10-02 NOTE — ED Provider Notes (Signed)
[name]    CSN: [id number] Arrival date & time: 10/02/16  1538     History   Chief Complaint Chief Complaint  Patient presents with  . Rash    HPI Stacy [name] is a 83 y.o. female.   HPI  Stacy [name] is a 83 y.o. female presenting to UC with daughter c/o 1 week gradually worsening, gradually spreading skin lesions.  Initial lesion started on Left wrist, then went to her nose, forehead, neck, Right upper arm and left lower leg.  The rash is itchy but not painful.  She has applied benadryl w/o relief.  She did get a new cat about 3 weeks ago that did have fleas at the time she got him.  No new soaps, lotions or medications. No contact with others with similar rash. No recent travel.    Past Medical History:  Diagnosis Date  . Anxiety   . Arthritis    Cervical DDD C5-6 and C6-7  . Atheroembolism   . Blue toe syndrome ([place])   . CAD (coronary artery disease)   . Cancer ([place])    skin BCC  Forehead and  SCC Left Lower Leg  . Carotid artery occlusion   . Chronic kidney disease, stage III (moderate)   . DDD (degenerative disc disease)   . Depression   . DM (diabetes mellitus) ([place])   . Gall stones   . Gallstones   . GERD (gastroesophageal reflux disease)   . Heart murmur   . Heart murmur   . Hemorrhoid   . Hemorrhoids   . Hiatal hernia   . History of shingles   . Hyperlipidemia   . Hypertension   . Leg pain   . Mild incontinence   . Multiple thyroid nodules    checked every year  . Myocardial infarction ([place]) 03/15/95  . Osteopenia   . Peptic ulcer disease   . Stomach cancer ([place]) 1980  . Stroke ([place])   . Thyroid nodule   . Vision abnormalities     Patient Active Problem List   Diagnosis Date Noted  . Paroxysmal atrial fibrillation ([place]) 08/05/2016  . Acute ischemic stroke ([place]) 06/23/2016  . CKD (chronic kidney disease), stage II 06/23/2016  . Carotid artery stenosis 04/13/2015  . Insomnia 04/25/2012  . Pain in limb 12/09/2011  .  HYPERLIPIDEMIA 06/18/2008  . RENAL ARTERY STENOSIS 06/18/2008  . GASTROESOPHAGEAL REFLUX DISEASE 06/18/2008  . PEPTIC ULCER DISEASE, HX OF 06/18/2008  . CATARACT EXTRACTIONS, BILATERAL, HX OF 06/18/2008  . Other acquired absence of organ 06/18/2008  . PERCUTANEOUS TRANSLUMINAL CORONARY ANGIOPLASTY, HX OF 06/18/2008  . Other postprocedural status(V45.89) 06/18/2008  . DM 05/16/2007  . Essential hypertension 05/16/2007  . MYOCARDIAL INFARCTION, HX OF 05/16/2007  . Coronary atherosclerosis 05/16/2007  . ARTHRITIS 05/16/2007  . OSTEOPENIA 05/16/2007  . HEMORRHOIDS 11/18/2006    Past Surgical History:  Procedure Laterality Date  . 3 epidural steroid injections    . APPENDECTOMY    . Basal Cell Cancer Removal     forehead  . CATARACT EXTRACTION    . CATARACT EXTRACTION  12/29/2003  . CORONARY ANGIOPLASTY  03-15-1995  . CORONARY ANGIOPLASTY WITH STENT PLACEMENT  05-19-1998  . CORONARY STENT PLACEMENT  2000  . DILATION AND CURETTAGE OF UTERUS    . ENDARTERECTOMY Right 04/13/2015   Procedure: ENDARTERECTOMY CAROTID;  Surgeon: Serafina Mitchell, MD;  Location: [facility] OR;  Service: Vascular;  Laterality: Right;  . ENDOMETRIAL BIOPSY    . EYE  SURGERY    . heart catherization    . HEMORRHOID SURGERY    . INCISIONAL HERNIA REPAIR    . INGUINAL HERNIA REPAIR    . LOOP RECORDER INSERTION N/A 06/24/2016   Procedure: Loop Recorder Insertion;  Surgeon: Deboraha Sprang, MD;  Location: [facility] CV LAB;  Service: Cardiovascular;  Laterality: N/A;  . PATCH ANGIOPLASTY Right 04/13/2015   Procedure: PATCH ANGIOPLASTY;  Surgeon: Serafina Mitchell, MD;  Location: [facility] OR;  Service: Vascular;  Laterality: Right;  . PTCA    . SQUAMOUS CELL CARCINOMA EXCISION     left lower leg  . TEE WITHOUT CARDIOVERSION N/A 06/24/2016   Procedure: TRANSESOPHAGEAL ECHOCARDIOGRAM (TEE);  Surgeon: Dorothy Spark, MD;  Location: [facility] ENDOSCOPY;  Service: Cardiovascular;  Laterality: N/A;  . TONSILLECTOMY      OB History     No data available       Home Medications    Prior to Admission medications   Medication Sig Start Date End Date Taking? Authorizing Provider  ACCU-CHEK SMARTVIEW test strip 1 each by Other route as needed for other (blood sugar test strips).  03/29/14   [provider]  acetaminophen (TYLENOL) 325 MG tablet Take 325 mg by mouth every 6 (six) hours as needed for mild pain or moderate pain.    [provider]  albuterol (PROVENTIL HFA;VENTOLIN HFA) 108 (90 Base) MCG/ACT inhaler Inhale 1-2 puffs into the lungs every 6 (six) hours as needed for wheezing or shortness of breath. Patient not taking: Reported on 08/29/2016 03/02/16   Noe Gens, PA-C  apixaban (ELIQUIS) 5 MG TABS tablet Take 1 tablet (5 mg total) by mouth 2 (two) times daily. 08/29/16   Sherran Needs, NP  cetirizine (ZYRTEC) 10 MG tablet Take 10 mg by mouth daily as needed for allergies.    [provider]  cholecalciferol (VITAMIN D) 400 UNITS TABS Take 400 Units by mouth daily.     [provider]  clotrimazole (LOTRIMIN) 1 % cream Apply to affected area 2 times daily for up to 4 weeks 10/02/16   Noe Gens, PA-C  Coenzyme Q10 (COQ-10 PO) Take 50 mg by mouth daily. 1 tab po qd    [provider]  conjugated estrogens (PREMARIN) vaginal cream Place 1 g vaginally once a week. .625 mg once weekly    [provider]  dexlansoprazole (DEXILANT) 60 MG capsule Take 60 mg by mouth daily as needed (for acid reflux). Take 1 tablet ( Dexilant DR ) by mouth once daily    [provider]  diphenhydrAMINE (BENADRYL) 25 mg capsule Take 25 mg by mouth as needed for allergies.     [provider]  docusate sodium (COLACE) 100 MG capsule Take 100 mg by mouth daily as needed.     [provider]  gabapentin (NEURONTIN) 100 MG capsule Take 100 mg by mouth 2 (two) times daily.  05/08/16   [provider]  hydrochlorothiazide (MICROZIDE) 12.5 MG capsule Take 1  capsule (12.5 mg total) by mouth daily. 07/01/16   Reyne Dumas, MD  hydrocortisone 2.5 % cream Apply 1 application topically daily as needed (for itching).  12/28/13   [provider]  Melatonin 5 MG CAPS Take 1 capsule by mouth at bedtime as needed (sleep).     [provider]  menthol-zinc oxide (GOLD BOND) powder Apply 1 application topically 2 (two) times daily as needed (freshness.).    [provider]  metoprolol succinate (  TOPROL-XL) 25 MG 24 hr tablet Take 1 tablet (25 mg total) by mouth daily. 10/30/15   Sherren Mocha, MD  Multiple Vitamins-Minerals (ICAPS AREDS 2 PO) Take by mouth.    [provider]  NEOMYCIN-POLYMYXIN-HYDROCORTISONE (CORTISPORIN) 1 % SOLN otic solution Place 2 drops into both ears as directed.  01/27/14   [provider]  nitroGLYCERIN (NITROSTAT) 0.4 MG SL tablet Place 1 tablet (0.4 mg total) under the tongue every 5 (five) minutes as needed for chest pain. 03/28/15   Sherren Mocha, MD  Polyethylene Glycol 400 (BLINK TEARS OP) Place 1 drop into both eyes daily as needed (for dry eye). Reported on 05/05/2015    [provider]  polyethylene glycol powder (GLYCOLAX/MIRALAX) powder Take 17 g by mouth daily. Reported on 06/12/2015    [provider]  ramipril (ALTACE) 2.5 MG capsule TAKE 1 CAPSULE (2.5 MG TOTAL) BY MOUTH 2 (TWO) TIMES DAILY. 07/01/16   Reyne Dumas, MD  rosuvastatin (CRESTOR) 10 MG tablet Take 1 tablet (10 mg total) by mouth daily. 10/30/15   Sherren Mocha, MD  sertraline (ZOLOFT) 25 MG tablet Take 25 mg by mouth daily.  04/22/12   [provider]  Terbinafine HCl 125 MG PACK Take 125 mg by mouth 2 (two) times daily. For 2 weeks 10/02/16   Noe Gens, PA-C  trolamine salicylate (ASPERCREME) 10 % cream Apply 1 application topically as needed for muscle pain.    [provider]    Family History Family History  Problem Relation Age of Onset  . Diabetes Father   . Prostate  cancer Father        meta to colon  . Cancer Father        Prostate  . Parkinsonism Mother   . Stroke Paternal Uncle   . Hypertension Paternal Uncle   . Heart attack Neg Hx     Social History Social History  Substance Use Topics  . Smoking status: Former Smoker    Packs/day: 1.00    Years: 54.00    Types: Cigarettes    Quit date: 10/05/1998  . Smokeless tobacco: Never Used  . Alcohol use No     Allergies   Iohexol; Sulfonamide derivatives; Latex; Tranilast; and Ivp dye [iodinated diagnostic agents]   Review of Systems Review of Systems  Constitutional: Negative for chills and fever.  Respiratory: Negative for chest tightness, shortness of breath and wheezing.   Musculoskeletal: Negative for arthralgias, joint swelling and myalgias.  Skin: Positive for color change and rash. Negative for wound.     Physical Exam Triage Vital Signs ED Triage Vitals  Enc Vitals Group     BP      Pulse      Resp      Temp      Temp src      SpO2      Weight      Height      Head Circumference      Peak Flow      Pain Score      Pain Loc      Pain Edu?      Excl. in [place]?    No data found.   Updated Vital Signs BP 103/64 (BP Location: Left Arm)   Pulse 68   Temp 98.2 F (36.8 C) (Oral)   Resp 16   Ht 5' 3.5" (1.613 m)   Wt 149 lb (67.6 kg)   SpO2 97%   BMI 25.98 kg/m  Visual Acuity Right Eye Distance:   Left Eye Distance:   Bilateral Distance:    Right Eye Near:   Left Eye Near:    Bilateral Near:     Physical Exam  Constitutional: She is oriented to person, place, and time. She appears well-developed and well-nourished. No distress.  HENT:  Head: Normocephalic and atraumatic.  Eyes: EOM are normal.  Neck: Normal range of motion.  Cardiovascular: Normal rate.   Pulmonary/Chest: Effort normal.  Musculoskeletal: Normal range of motion.  Neurological: She is alert and oriented to person, place, and time.  Skin: Skin is warm and dry. Rash noted. She is not  diaphoretic. There is erythema.     Multiple erythematous circular lesions with centralized clearing and well defined border. No bleeding or discharge.   Psychiatric: She has a normal mood and affect. Her behavior is normal.  Nursing note and vitals reviewed.    UC Treatments / Results  Labs (all labs ordered are listed, but only abnormal results are displayed) Labs Reviewed - No data to display  EKG  EKG Interpretation None       Radiology No results found.  Procedures Procedures (including critical care time)  Medications Ordered in UC Medications - No data to display   Initial Impression / Assessment and Plan / UC Course  I have reviewed the triage vital signs and the nursing notes.  Pertinent labs & imaging results that were available during my care of the patient were reviewed by me and considered in my medical decision making (see chart for details).     Rash is c/w Tinea corporis.  No evidence of secondary bacterial infection. Not c/w allergic reaction  Final Clinical Impressions(s) / UC Diagnoses   Final diagnoses:  Tinea corporis   Pt is on Eliquis and concerned about taking new prescription drugs but will call her Afib specialist in the morning to discussed medications prescribed today to double check it is okay for her to use. Encouraged pt to take cat to Vet for testing/treatment as cat is most likely source/cause of rash/lesions.  Home care instruction packet provided.   New Prescriptions Discharge Medication List as of 10/02/2016  4:29 PM    START taking these medications   Details  clotrimazole (LOTRIMIN) 1 % cream Apply to affected area 2 times daily for up to 4 weeks, Normal    Terbinafine HCl 125 MG PACK Take 125 mg by mouth 2 (two) times daily. For 2 weeks, Starting Wed 10/02/2016, Normal         Controlled Substance Prescriptions [place] Controlled Substance Registry consulted? Not Applicable   Tyrell Antonio 10/02/16 1728

## 2016-10-02 NOTE — Discharge Instructions (Signed)
°  The rash/skin lesions look most consistent with ring work, which is a fungal infection. It may have come from your new cat.  Please read the information packet provided to you today about home treatments.  It is recommended you also bring your cat to the vet to have it evaluated as they vet may test your cat and/or put on medication to help prevent spread of infection.  Please let your primary care provider and cardiologist/provider who is prescribing your Eliquis and other medications know that you were started on 2 new medications today.   If you develop any symptoms you believe are related to your medications, please stop the medications immediately and be evaluated by a medical provider.

## 2016-10-03 DIAGNOSIS — B354 Tinea corporis: Secondary | ICD-10-CM | POA: Diagnosis not present

## 2016-10-04 ENCOUNTER — Encounter (HOSPITAL_COMMUNITY): Payer: Self-pay

## 2016-10-07 ENCOUNTER — Encounter (HOSPITAL_COMMUNITY): Payer: Self-pay

## 2016-10-07 NOTE — Progress Notes (Signed)
Loop Recorder Summary Report 

## 2016-10-09 ENCOUNTER — Encounter (HOSPITAL_COMMUNITY): Payer: Self-pay

## 2016-10-11 ENCOUNTER — Encounter (HOSPITAL_COMMUNITY)
Admission: RE | Admit: 2016-10-11 | Discharge: 2016-10-11 | Disposition: A | Discharge status: Home / Self Care | Payer: Self-pay | Source: Ambulatory Visit | Attending: Cardiovascular Disease | Admitting: Cardiovascular Disease

## 2016-10-16 ENCOUNTER — Encounter (HOSPITAL_COMMUNITY)
Admission: RE | Admit: 2016-10-16 | Discharge: 2016-10-16 | Disposition: A | Discharge status: Home / Self Care | Payer: Self-pay | Source: Ambulatory Visit | Attending: Cardiovascular Disease | Admitting: Cardiovascular Disease

## 2016-10-16 DIAGNOSIS — I2510 Atherosclerotic heart disease of native coronary artery without angina pectoris: Secondary | ICD-10-CM | POA: Insufficient documentation

## 2016-10-18 ENCOUNTER — Encounter (HOSPITAL_COMMUNITY)
Admission: RE | Admit: 2016-10-18 | Discharge: 2016-10-18 | Disposition: A | Discharge status: Home / Self Care | Payer: Self-pay | Source: Ambulatory Visit | Attending: Cardiovascular Disease | Admitting: Cardiovascular Disease

## 2016-10-21 ENCOUNTER — Encounter (HOSPITAL_COMMUNITY)
Admission: RE | Admit: 2016-10-21 | Discharge: 2016-10-21 | Disposition: A | Discharge status: Home / Self Care | Payer: Self-pay | Source: Ambulatory Visit | Attending: Cardiovascular Disease | Admitting: Cardiovascular Disease

## 2016-10-22 ENCOUNTER — Ambulatory Visit (INDEPENDENT_AMBULATORY_CARE_PROVIDER_SITE_OTHER): Payer: Medicare Other | Admitting: *Deleted

## 2016-10-22 DIAGNOSIS — I639 Cerebral infarction, unspecified: Secondary | ICD-10-CM

## 2016-10-23 ENCOUNTER — Encounter (HOSPITAL_COMMUNITY)
Admission: RE | Admit: 2016-10-23 | Discharge: 2016-10-23 | Disposition: A | Discharge status: Home / Self Care | Payer: Self-pay | Source: Ambulatory Visit | Attending: Cardiovascular Disease | Admitting: Cardiovascular Disease

## 2016-10-23 NOTE — Progress Notes (Signed)
Carelink Summary Report / Loop Recorder 

## 2016-10-24 ENCOUNTER — Other Ambulatory Visit (HOSPITAL_COMMUNITY): Payer: Self-pay | Admitting: Nurse Practitioner

## 2016-10-25 ENCOUNTER — Encounter (HOSPITAL_COMMUNITY): Payer: Self-pay

## 2016-10-26 LAB — CUP PACEART REMOTE DEVICE CHECK
Implantable Pulse Generator Implant Date: 20180514
MDC IDC SESS DTM: 20180912004307

## 2016-10-26 NOTE — Progress Notes (Signed)
Carelink summary report received. Battery status OK. Normal device function. No new symptom episodes, tachy episodes, brady, or pause episodes. 6 AF 1.3% +Eliquis. Monthly summary reports and ROV/PRN

## 2016-10-28 ENCOUNTER — Encounter (HOSPITAL_COMMUNITY): Payer: Self-pay

## 2016-10-30 ENCOUNTER — Encounter (HOSPITAL_COMMUNITY)
Admission: RE | Admit: 2016-10-30 | Discharge: 2016-10-30 | Disposition: A | Discharge status: Home / Self Care | Payer: Self-pay | Source: Ambulatory Visit | Attending: Cardiovascular Disease | Admitting: Cardiovascular Disease

## 2016-11-01 ENCOUNTER — Encounter (HOSPITAL_COMMUNITY): Payer: Self-pay

## 2016-11-04 ENCOUNTER — Encounter (HOSPITAL_COMMUNITY)
Admission: RE | Admit: 2016-11-04 | Discharge: 2016-11-04 | Disposition: A | Discharge status: Home / Self Care | Payer: Self-pay | Source: Ambulatory Visit | Attending: Cardiovascular Disease | Admitting: Cardiovascular Disease

## 2016-11-06 ENCOUNTER — Encounter (HOSPITAL_COMMUNITY): Payer: Self-pay

## 2016-11-08 ENCOUNTER — Encounter (HOSPITAL_COMMUNITY): Payer: Self-pay

## 2016-11-11 ENCOUNTER — Encounter (HOSPITAL_COMMUNITY)
Admission: RE | Admit: 2016-11-11 | Discharge: 2016-11-11 | Disposition: A | Discharge status: Home / Self Care | Payer: Self-pay | Source: Ambulatory Visit | Attending: Cardiovascular Disease | Admitting: Cardiovascular Disease

## 2016-11-11 DIAGNOSIS — I2510 Atherosclerotic heart disease of native coronary artery without angina pectoris: Secondary | ICD-10-CM | POA: Insufficient documentation

## 2016-11-13 ENCOUNTER — Encounter (HOSPITAL_COMMUNITY)
Admission: RE | Admit: 2016-11-13 | Discharge: 2016-11-13 | Disposition: A | Discharge status: Home / Self Care | Payer: Self-pay | Source: Ambulatory Visit | Attending: Cardiovascular Disease | Admitting: Cardiovascular Disease

## 2016-11-13 ENCOUNTER — Other Ambulatory Visit: Payer: Self-pay | Admitting: Cardiovascular Disease

## 2016-11-13 DIAGNOSIS — I2510 Atherosclerotic heart disease of native coronary artery without angina pectoris: Secondary | ICD-10-CM

## 2016-11-14 NOTE — Telephone Encounter (Signed)
Rx refill sent to pharmacy. 

## 2016-11-15 ENCOUNTER — Encounter (HOSPITAL_COMMUNITY)
Admission: RE | Admit: 2016-11-15 | Discharge: 2016-11-15 | Disposition: A | Discharge status: Home / Self Care | Payer: Self-pay | Source: Ambulatory Visit | Attending: Cardiovascular Disease | Admitting: Cardiovascular Disease

## 2016-11-18 ENCOUNTER — Encounter (HOSPITAL_COMMUNITY)
Admission: RE | Admit: 2016-11-18 | Discharge: 2016-11-18 | Disposition: A | Discharge status: Home / Self Care | Payer: Self-pay | Source: Ambulatory Visit | Attending: Cardiovascular Disease | Admitting: Cardiovascular Disease

## 2016-11-20 ENCOUNTER — Encounter (HOSPITAL_COMMUNITY)
Admission: RE | Admit: 2016-11-20 | Discharge: 2016-11-20 | Disposition: A | Discharge status: Home / Self Care | Payer: Self-pay | Source: Ambulatory Visit | Attending: Cardiovascular Disease | Admitting: Cardiovascular Disease

## 2016-11-21 ENCOUNTER — Ambulatory Visit (INDEPENDENT_AMBULATORY_CARE_PROVIDER_SITE_OTHER): Payer: Medicare Other | Admitting: *Deleted

## 2016-11-21 DIAGNOSIS — I639 Cerebral infarction, unspecified: Secondary | ICD-10-CM | POA: Diagnosis not present

## 2016-11-22 ENCOUNTER — Encounter (HOSPITAL_COMMUNITY)
Admission: RE | Admit: 2016-11-22 | Discharge: 2016-11-22 | Disposition: A | Discharge status: Home / Self Care | Payer: Self-pay | Source: Ambulatory Visit | Attending: Cardiovascular Disease | Admitting: Cardiovascular Disease

## 2016-11-22 NOTE — Progress Notes (Signed)
Carelink Summary Report / Loop Recorder 

## 2016-11-25 ENCOUNTER — Encounter (HOSPITAL_COMMUNITY)
Admission: RE | Admit: 2016-11-25 | Discharge: 2016-11-25 | Disposition: A | Discharge status: Home / Self Care | Payer: Self-pay | Source: Ambulatory Visit | Attending: Cardiovascular Disease | Admitting: Cardiovascular Disease

## 2016-11-26 DIAGNOSIS — E119 Type 2 diabetes mellitus without complications: Secondary | ICD-10-CM | POA: Diagnosis not present

## 2016-11-26 DIAGNOSIS — I10 Essential (primary) hypertension: Secondary | ICD-10-CM | POA: Diagnosis not present

## 2016-11-26 DIAGNOSIS — E782 Mixed hyperlipidemia: Secondary | ICD-10-CM | POA: Diagnosis not present

## 2016-11-26 LAB — CUP PACEART REMOTE DEVICE CHECK
MDC IDC PG IMPLANT DT: 20180514
MDC IDC SESS DTM: 20181012014031

## 2016-11-27 ENCOUNTER — Encounter (HOSPITAL_COMMUNITY): Payer: Self-pay

## 2016-11-28 DIAGNOSIS — H906 Mixed conductive and sensorineural hearing loss, bilateral: Secondary | ICD-10-CM | POA: Diagnosis not present

## 2016-11-28 DIAGNOSIS — E782 Mixed hyperlipidemia: Secondary | ICD-10-CM | POA: Diagnosis not present

## 2016-11-28 DIAGNOSIS — E1159 Type 2 diabetes mellitus with other circulatory complications: Secondary | ICD-10-CM | POA: Diagnosis not present

## 2016-11-28 DIAGNOSIS — I10 Essential (primary) hypertension: Secondary | ICD-10-CM | POA: Diagnosis not present

## 2016-11-28 DIAGNOSIS — F329 Major depressive disorder, single episode, unspecified: Secondary | ICD-10-CM | POA: Diagnosis not present

## 2016-11-28 DIAGNOSIS — Z23 Encounter for immunization: Secondary | ICD-10-CM | POA: Diagnosis not present

## 2016-11-29 ENCOUNTER — Encounter (HOSPITAL_COMMUNITY)
Admission: RE | Admit: 2016-11-29 | Discharge: 2016-11-29 | Disposition: A | Discharge status: Home / Self Care | Payer: Self-pay | Source: Ambulatory Visit | Attending: Cardiovascular Disease | Admitting: Cardiovascular Disease

## 2016-12-02 ENCOUNTER — Encounter (HOSPITAL_COMMUNITY)
Admission: RE | Admit: 2016-12-02 | Discharge: 2016-12-02 | Disposition: A | Discharge status: Home / Self Care | Payer: Self-pay | Source: Ambulatory Visit | Attending: Cardiovascular Disease | Admitting: Cardiovascular Disease

## 2016-12-04 ENCOUNTER — Encounter (HOSPITAL_COMMUNITY)
Admission: RE | Admit: 2016-12-04 | Discharge: 2016-12-04 | Disposition: A | Discharge status: Home / Self Care | Payer: Self-pay | Source: Ambulatory Visit | Attending: Cardiovascular Disease | Admitting: Cardiovascular Disease

## 2016-12-06 ENCOUNTER — Ambulatory Visit (INDEPENDENT_AMBULATORY_CARE_PROVIDER_SITE_OTHER): Payer: Medicare Other | Admitting: Cardiovascular Disease

## 2016-12-06 ENCOUNTER — Encounter (HOSPITAL_COMMUNITY): Payer: Self-pay

## 2016-12-06 ENCOUNTER — Other Ambulatory Visit: Payer: Self-pay | Admitting: Internal Medicine

## 2016-12-06 ENCOUNTER — Encounter: Payer: Self-pay | Admitting: Cardiovascular Disease

## 2016-12-06 VITALS — BP 104/62 | HR 102 | Ht 63.5 in | Wt 151.0 lb

## 2016-12-06 DIAGNOSIS — I480 Paroxysmal atrial fibrillation: Secondary | ICD-10-CM | POA: Diagnosis not present

## 2016-12-06 DIAGNOSIS — I2510 Atherosclerotic heart disease of native coronary artery without angina pectoris: Secondary | ICD-10-CM | POA: Diagnosis not present

## 2016-12-06 DIAGNOSIS — I639 Cerebral infarction, unspecified: Secondary | ICD-10-CM

## 2016-12-06 DIAGNOSIS — E785 Hyperlipidemia, unspecified: Secondary | ICD-10-CM

## 2016-12-06 MED ORDER — ROSUVASTATIN CALCIUM 10 MG PO TABS: 10.0000 mg | ORAL_TABLET | Freq: Every day | ORAL | 3 refills | Status: DC

## 2016-12-06 MED ORDER — HYDROCHLOROTHIAZIDE 12.5 MG PO CAPS: 12.5000 mg | ORAL_CAPSULE | Freq: Every day | ORAL | 3 refills | Status: DC

## 2016-12-06 MED ORDER — APIXABAN 5 MG PO TABS: 5.0000 mg | ORAL_TABLET | Freq: Two times a day (BID) | ORAL | 3 refills | Status: DC

## 2016-12-06 MED ORDER — NITROGLYCERIN 0.4 MG SL SUBL: 0.4000 mg | SUBLINGUAL_TABLET | SUBLINGUAL | 3 refills | Status: DC | PRN

## 2016-12-06 NOTE — Progress Notes (Signed)
Cardiology Office Note Date:  12/10/2016   ID:  Stacy [name], Stacy [name] 1927-02-03, MRN [id number]  PCP:  Lillard Anes, MD  Cardiologist:  Sherren Mocha, MD    Chief Complaint  Patient presents with  . Coronary Artery Disease  . Atrial Fibrillation     History of Present Illness: Stacy [name] is a 83 y.o. female who presents for follow-up evaluation. The patient is followed for coronary artery disease. She initially presented in 1997 with an anterior MI treated with balloon angioplasty of the LAD. She subsequently underwent stenting of the right coronary artery in 2000. Because of recurrent symptoms, she underwent repeat heart catheterization in 2008 and this demonstrated patency of her previous angioplasty and stent sites.  The patient presented with a stroke in May 2018 and was diagnosed with paroxysmal atrial fibrillation at that time.  She was evaluated in the atrial fibrillation clinic and started on oral anticoagulation with apixaban.  Antiplatelet therapy was discontinued.  The patient is here with her daughter today.  She continues to do remarkably well.  She is participating in the maintenance phase of cardiac rehab and denies any exertional symptoms.  She has had no recurrent stroke or TIA symptoms.  She specifically denies chest pain, shortness of breath, or heart palpitations.  She brings in documentation from her loop recorder interrogations.  Her atrial fibrillation burden is about 0.5-1% on average.  She is tolerating oral anticoagulation with apixaban and denies bleeding problems.  Past Medical History:  Diagnosis Date  . Anxiety   . Arthritis    Cervical DDD C5-6 and C6-7  . Atheroembolism   . Blue toe syndrome ([place])   . CAD (coronary artery disease)   . Cancer ([place])    skin BCC  Forehead and  SCC Left Lower Leg  . Carotid artery occlusion   . Chronic kidney disease, stage III (moderate) (HCC)   . DDD (degenerative disc disease)   . Depression   . DM  (diabetes mellitus) ([place])   . Gall stones   . Gallstones   . GERD (gastroesophageal reflux disease)   . Heart murmur   . Heart murmur   . Hemorrhoid   . Hemorrhoids   . Hiatal hernia   . History of shingles   . Hyperlipidemia   . Hypertension   . Leg pain   . Mild incontinence   . Multinodular goiter   . Multiple thyroid nodules    checked every year  . Myocardial infarction ([place]) 03/15/95  . Osteopenia   . Peptic ulcer disease   . Stenosis of right carotid artery   . Stomach cancer ([place]) 1980  . Stroke ([place])   . Thyroid nodule   . Vision abnormalities     Past Surgical History:  Procedure Laterality Date  . 3 epidural steroid injections    . APPENDECTOMY    . Basal Cell Cancer Removal     forehead  . CATARACT EXTRACTION    . CATARACT EXTRACTION  12/29/2003  . CORONARY ANGIOPLASTY  03-15-1995  . CORONARY ANGIOPLASTY WITH STENT PLACEMENT  05-19-1998  . CORONARY STENT PLACEMENT  2000  . DILATION AND CURETTAGE OF UTERUS    . ENDARTERECTOMY Right 04/13/2015   Procedure: ENDARTERECTOMY CAROTID;  Surgeon: Serafina Mitchell, MD;  Location: [facility] OR;  Service: Vascular;  Laterality: Right;  . ENDOMETRIAL BIOPSY    . EYE SURGERY    . heart catherization    . HEMORRHOID SURGERY    . INCISIONAL  HERNIA REPAIR    . INGUINAL HERNIA REPAIR    . LOOP RECORDER INSERTION N/A 06/24/2016   Procedure: Loop Recorder Insertion;  Surgeon: Deboraha Sprang, MD;  Location: [place] [facility];  Service: Cardiovascular;  Laterality: N/A;  . PATCH ANGIOPLASTY Right 04/13/2015   Procedure: PATCH ANGIOPLASTY;  Surgeon: Serafina Mitchell, MD;  Location: [facility] OR;  Service: Vascular;  Laterality: Right;  . PTCA    . SQUAMOUS CELL CARCINOMA EXCISION     left lower leg  . TEE WITHOUT CARDIOVERSION N/A 06/24/2016   Procedure: TRANSESOPHAGEAL ECHOCARDIOGRAM (TEE);  Surgeon: Dorothy Spark, MD;  Location: [facility] ENDOSCOPY;  Service: Cardiovascular;  Laterality: N/A;  . TONSILLECTOMY      Current Outpatient  Prescriptions  Medication Sig Dispense Refill  . ACCU-CHEK SMARTVIEW test strip 1 each by Other route as needed for other (blood sugar test strips).   11  . acetaminophen (TYLENOL) 325 MG tablet Take 325 mg by mouth every 6 (six) hours as needed for mild pain or moderate pain.    Marland Kitchen albuterol (PROVENTIL HFA;VENTOLIN HFA) 108 (90 Base) MCG/ACT inhaler Inhale 1-2 puffs into the lungs every 6 (six) hours as needed for wheezing or shortness of breath. 1 Inhaler 0  . apixaban (ELIQUIS) 5 MG TABS tablet Take 1 tablet (5 mg total) by mouth 2 (two) times daily. 180 tablet 3  . cholecalciferol (VITAMIN D) 400 UNITS TABS Take 400 Units by mouth daily.     . clotrimazole (LOTRIMIN) 1 % cream Apply to affected area 2 times daily for up to 4 weeks 15 g 1  . Coenzyme Q10 (COQ-10 PO) Take 50 mg by mouth daily. 1 tab po qd    . conjugated estrogens (PREMARIN) vaginal cream Place 1 g vaginally once a week. .625 mg once weekly    . dexlansoprazole (DEXILANT) 60 MG capsule Take 60 mg by mouth daily as needed (for acid reflux). Take 1 tablet ( Dexilant DR ) by mouth once daily    . diphenhydrAMINE (BENADRYL) 25 mg capsule Take 25 mg by mouth as needed for allergies.     Marland Kitchen docusate sodium (COLACE) 100 MG capsule Take 100 mg by mouth daily as needed.     . gabapentin (NEURONTIN) 100 MG capsule Take 100 mg by mouth 2 (two) times daily.     . hydrochlorothiazide (MICROZIDE) 12.5 MG capsule Take 1 capsule (12.5 mg total) by mouth daily. 90 capsule 3  . hydrocortisone 2.5 % cream Apply 1 application topically daily as needed (for itching).   1  . Melatonin 5 MG CAPS Take 1 capsule by mouth at bedtime as needed (sleep).     . menthol-zinc oxide (GOLD BOND) powder Apply 1 application topically 2 (two) times daily as needed (freshness.).    Marland Kitchen metoprolol succinate (TOPROL-XL) 25 MG 24 hr tablet TAKE 1 TABLET BY MOUTH EVERY DAY 90 tablet 3  . Multiple Vitamins-Minerals (ICAPS AREDS 2 PO) Take by mouth.    .  NEOMYCIN-POLYMYXIN-HYDROCORTISONE (CORTISPORIN) 1 % SOLN otic solution Place 2 drops into both ears as directed.   3  . nitroGLYCERIN (NITROSTAT) 0.4 MG SL tablet Place 1 tablet (0.4 mg total) under the tongue every 5 (five) minutes as needed for chest pain. 25 tablet 3  . Polyethylene Glycol 400 (BLINK TEARS OP) Place 1 drop into both eyes daily as needed (for dry eye). Reported on 05/05/2015    . polyethylene glycol powder (GLYCOLAX/MIRALAX) powder Take 17 g by mouth daily. Reported on  06/12/2015    . ramipril (ALTACE) 2.5 MG capsule TAKE 1 CAPSULE (2.5 MG TOTAL) BY MOUTH 2 (TWO) TIMES DAILY. 180 capsule 3  . rosuvastatin (CRESTOR) 10 MG tablet Take 1 tablet (10 mg total) by mouth daily. 90 tablet 3  . sertraline (ZOLOFT) 25 MG tablet Take 25 mg by mouth daily.     Marland Kitchen trolamine salicylate (ASPERCREME) 10 % cream Apply 1 application topically as needed for muscle pain.     No current facility-administered medications for this visit.     Allergies:   Iohexol; Sulfonamide derivatives; Latex; Tranilast; Ivp dye [iodinated diagnostic agents]; Metformin and related; and Tape   Social History:  The patient  reports that she quit smoking about 18 years ago. Her smoking use included Cigarettes. She has a 54.00 pack-year smoking history. She has never used smokeless tobacco. She reports that she does not drink alcohol or use drugs.   Family History:  The patient's family history includes Cancer in her father; Diabetes in her father; Hypertension in her paternal uncle; Parkinsonism in her mother; Prostate cancer in her father; Stroke in her paternal uncle.   ROS:  Please see the history of present illness.  All other systems are reviewed and negative.   PHYSICAL EXAM: VS:  BP 104/62   Pulse (!) 102   Ht 5' 3.5" (1.613 m)   Wt 151 lb (68.5 kg)   SpO2 94%   BMI 26.33 kg/m  , BMI Body mass index is 26.33 kg/m. GEN: Well nourished, well developed, pleasant elderly woman in no acute distress  HEENT:  normal  Neck: no JVD, no masses. BL carotid bruits Cardiac: RRR with soft systolic murmur at the RUSB      Respiratory:  clear to auscultation bilaterally, normal work of breathing GI: soft, nontender, nondistended, + BS MS: no deformity or atrophy  Ext: no pretibial edema, pedal pulses 2+= bilaterally Skin: warm and dry, no rash Neuro:  Strength and sensation are intact Psych: euthymic mood, full affect  EKG:  EKG is not ordered today.  Recent Labs: 06/24/2016: ALT 26 08/29/2016: BUN 13; Creatinine, Ser 0.96; Hemoglobin 14.9; Platelets 219; Potassium 3.9; Sodium 137   Lipid Panel     Component Value Date/Time   CHOL 102 06/23/2016 0441   TRIG 116 06/23/2016 0441   HDL 42 06/23/2016 0441   CHOLHDL 2.4 06/23/2016 0441   VLDL 23 06/23/2016 0441   LDLCALC 37 06/23/2016 0441      Wt Readings from Last 3 Encounters:  12/06/16 151 lb (68.5 kg)  10/02/16 149 lb (67.6 kg)  08/29/16 151 lb (68.5 kg)     Cardiac Studies Reviewed: Echo 06-23-2016: Study Conclusions  - Left ventricle: There is a large apical and distal anterior   akinetic area. No definate thrombus is seen but if embolus   suspected may repeat with Definity. Virgie Dad is also a   consideration. The cavity size was normal. Wall thickness was   increased in a pattern of mild LVH. Systolic function was mildly   to moderately reduced. The estimated ejection fraction was in the   range of 40% to 45%. Akinesis of the mid-apicalanterior and   apical myocardium. - Mitral valve: There was moderate regurgitation directed   posteriorly and toward the free wall. - Left atrium: The atrium was mildly dilated. - Tricuspid valve: There was moderate regurgitation. - Pulmonary arteries: Systolic pressure was mildly increased. PA   peak pressure: 40 mm Hg (S).  Nuclear Scat 08-21-2016: Study Highlights  Nuclear stress EF: 56%.  Findings consistent with ischemia and prior myocardial infarction.  This is an intermediate  risk study.  The left ventricular ejection fraction is normal (55-65%).   Large anterior apical wall infarct with likely aneurysm formation. Small area of mild ischemia in basal lateral and basal inferior wall EF estimated at 56% with apical akinesis but is likely lower given perfusion defect    ASSESSMENT AND PLAN: 1.  Paroxysmal atrial fibrillation: Low atrial fib burden based on her loop recorder readings.  Continue apixaban for anticoagulation.  2.  Coronary artery disease, native vessel, without angina: The patient is stable.  Her most recent stress Myoview is reviewed and demonstrates anterior scar with minimal peri-infarct ischemia.  Continue medical therapy.  3.  Hyperlipidemia: Continue Crestor 10 mg daily.  She brings in labs which are reviewed today.  Cholesterol is 120, triglycerides 140, HDL 49, LDL 43.  4.  Hypertension: Blood pressure is well controlled on current medicines.  5.  Carotid artery stenosis: The patient has a history of carotid endarterectomy.  She is followed by vascular surgery.  6.  Type 2 diabetes: Managed with oral hypoglycemic agents.  She is having problems swallowing the Metformin pills and is going to be switched to a liquid version.  7.  Paroxysmal nonsustained ventricular tachycardia: She continues on a beta-blocker.  No recent events based on review of her loop recorder data.  Current medicines are reviewed with the patient today.  The patient does not have concerns regarding medicines.  Labs/ tests ordered today include:  No orders of the defined types were placed in this encounter.  Disposition:   FU 6 months  Signed, Sherren Mocha, MD  12/10/2016 5:40 AM    [facility] [place], [place], [place]  [address] Phone: [phone]; Fax: [phone]

## 2016-12-06 NOTE — Patient Instructions (Signed)

## 2016-12-09 ENCOUNTER — Encounter (HOSPITAL_COMMUNITY)
Admission: RE | Admit: 2016-12-09 | Discharge: 2016-12-09 | Disposition: A | Discharge status: Home / Self Care | Payer: Self-pay | Source: Ambulatory Visit | Attending: Cardiovascular Disease | Admitting: Cardiovascular Disease

## 2016-12-10 ENCOUNTER — Other Ambulatory Visit: Payer: Self-pay | Admitting: Cardiovascular Disease

## 2016-12-10 DIAGNOSIS — I2510 Atherosclerotic heart disease of native coronary artery without angina pectoris: Secondary | ICD-10-CM

## 2016-12-10 DIAGNOSIS — E785 Hyperlipidemia, unspecified: Secondary | ICD-10-CM

## 2016-12-10 NOTE — Telephone Encounter (Signed)
Medication Detail    Disp Refills Start End   rosuvastatin (CRESTOR) 10 MG tablet 90 tablet 3 12/06/2016    Sig - Route: Take 1 tablet (10 mg total) by mouth daily. - Oral   Sent to pharmacy as: rosuvastatin (CRESTOR) 10 MG tablet   E-Prescribing Status: Receipt confirmed by pharmacy (12/06/2016 2:26 PM EDT)   Associated Diagnoses   Hyperlipidemia, unspecified hyperlipidemia type     Coronary artery disease involving native coronary artery of native heart without angina pectoris     Pharmacy   [id number] - [place], [place]

## 2016-12-11 ENCOUNTER — Encounter (HOSPITAL_COMMUNITY): Admission: RE | Admit: 2016-12-11 | Payer: Self-pay | Source: Ambulatory Visit

## 2016-12-13 ENCOUNTER — Encounter (HOSPITAL_COMMUNITY): Payer: Self-pay

## 2016-12-16 ENCOUNTER — Encounter (HOSPITAL_COMMUNITY): Payer: Self-pay

## 2016-12-16 DIAGNOSIS — I2510 Atherosclerotic heart disease of native coronary artery without angina pectoris: Secondary | ICD-10-CM | POA: Insufficient documentation

## 2016-12-18 ENCOUNTER — Encounter (HOSPITAL_COMMUNITY): Payer: Self-pay

## 2016-12-20 ENCOUNTER — Encounter (HOSPITAL_COMMUNITY): Payer: Self-pay

## 2016-12-23 ENCOUNTER — Ambulatory Visit (INDEPENDENT_AMBULATORY_CARE_PROVIDER_SITE_OTHER): Payer: Medicare Other | Admitting: *Deleted

## 2016-12-23 ENCOUNTER — Encounter (HOSPITAL_COMMUNITY): Payer: Self-pay

## 2016-12-23 DIAGNOSIS — I639 Cerebral infarction, unspecified: Secondary | ICD-10-CM

## 2016-12-23 NOTE — Progress Notes (Signed)
Carelink Summary Report / Loop Recorder 

## 2016-12-25 ENCOUNTER — Encounter (HOSPITAL_COMMUNITY): Payer: Self-pay

## 2016-12-27 ENCOUNTER — Encounter (HOSPITAL_COMMUNITY): Payer: Self-pay

## 2016-12-30 ENCOUNTER — Encounter (HOSPITAL_COMMUNITY)
Admission: RE | Admit: 2016-12-30 | Discharge: 2016-12-30 | Disposition: A | Discharge status: Home / Self Care | Payer: Self-pay | Source: Ambulatory Visit | Attending: Cardiovascular Disease | Admitting: Cardiovascular Disease

## 2017-01-01 ENCOUNTER — Encounter (HOSPITAL_COMMUNITY)
Admission: RE | Admit: 2017-01-01 | Discharge: 2017-01-01 | Disposition: A | Discharge status: Home / Self Care | Payer: Self-pay | Source: Ambulatory Visit | Attending: Cardiovascular Disease | Admitting: Cardiovascular Disease

## 2017-01-03 LAB — CUP PACEART REMOTE DEVICE CHECK
Date Time Interrogation Session: 20181111024818
MDC IDC PG IMPLANT DT: 20180514

## 2017-01-06 ENCOUNTER — Encounter (HOSPITAL_COMMUNITY)
Admission: RE | Admit: 2017-01-06 | Discharge: 2017-01-06 | Disposition: A | Discharge status: Home / Self Care | Payer: Self-pay | Source: Ambulatory Visit | Attending: Cardiovascular Disease | Admitting: Cardiovascular Disease

## 2017-01-08 ENCOUNTER — Encounter (HOSPITAL_COMMUNITY)
Admission: RE | Admit: 2017-01-08 | Discharge: 2017-01-08 | Disposition: A | Discharge status: Home / Self Care | Payer: Self-pay | Source: Ambulatory Visit | Attending: Cardiovascular Disease | Admitting: Cardiovascular Disease

## 2017-01-10 ENCOUNTER — Encounter (HOSPITAL_COMMUNITY)
Admission: RE | Admit: 2017-01-10 | Discharge: 2017-01-10 | Disposition: A | Discharge status: Home / Self Care | Payer: Self-pay | Source: Ambulatory Visit | Attending: Cardiovascular Disease | Admitting: Cardiovascular Disease

## 2017-01-13 ENCOUNTER — Encounter (HOSPITAL_COMMUNITY)
Admission: RE | Admit: 2017-01-13 | Discharge: 2017-01-13 | Disposition: A | Discharge status: Home / Self Care | Payer: Self-pay | Source: Ambulatory Visit | Attending: Cardiovascular Disease | Admitting: Cardiovascular Disease

## 2017-01-13 DIAGNOSIS — I2510 Atherosclerotic heart disease of native coronary artery without angina pectoris: Secondary | ICD-10-CM | POA: Insufficient documentation

## 2017-01-15 ENCOUNTER — Encounter (HOSPITAL_COMMUNITY)
Admission: RE | Admit: 2017-01-15 | Discharge: 2017-01-15 | Disposition: A | Discharge status: Home / Self Care | Payer: Self-pay | Source: Ambulatory Visit | Attending: Cardiovascular Disease | Admitting: Cardiovascular Disease

## 2017-01-17 ENCOUNTER — Encounter (HOSPITAL_COMMUNITY)
Admission: RE | Admit: 2017-01-17 | Discharge: 2017-01-17 | Disposition: A | Discharge status: Home / Self Care | Payer: BLUE CROSS/BLUE SHIELD | Source: Ambulatory Visit | Attending: Cardiovascular Disease | Admitting: Cardiovascular Disease

## 2017-01-20 ENCOUNTER — Encounter (HOSPITAL_COMMUNITY): Payer: Self-pay

## 2017-01-20 ENCOUNTER — Ambulatory Visit (INDEPENDENT_AMBULATORY_CARE_PROVIDER_SITE_OTHER): Payer: Medicare Other | Admitting: *Deleted

## 2017-01-20 DIAGNOSIS — I639 Cerebral infarction, unspecified: Secondary | ICD-10-CM | POA: Diagnosis not present

## 2017-01-21 NOTE — Progress Notes (Signed)
Carelink Summary Report / Loop Recorder 

## 2017-01-22 ENCOUNTER — Encounter (HOSPITAL_COMMUNITY): Payer: Self-pay

## 2017-01-24 ENCOUNTER — Encounter (HOSPITAL_COMMUNITY): Payer: Self-pay

## 2017-01-27 ENCOUNTER — Encounter (HOSPITAL_COMMUNITY)
Admission: RE | Admit: 2017-01-27 | Discharge: 2017-01-27 | Disposition: A | Discharge status: Home / Self Care | Payer: Self-pay | Source: Ambulatory Visit | Attending: Cardiovascular Disease | Admitting: Cardiovascular Disease

## 2017-01-27 LAB — CUP PACEART REMOTE DEVICE CHECK
Date Time Interrogation Session: 20181211031220
Implantable Pulse Generator Implant Date: 20180514

## 2017-01-29 ENCOUNTER — Encounter (HOSPITAL_COMMUNITY)
Admission: RE | Admit: 2017-01-29 | Discharge: 2017-01-29 | Disposition: A | Discharge status: Home / Self Care | Payer: BLUE CROSS/BLUE SHIELD | Source: Ambulatory Visit | Attending: Cardiovascular Disease | Admitting: Cardiovascular Disease

## 2017-01-31 ENCOUNTER — Ambulatory Visit (INDEPENDENT_AMBULATORY_CARE_PROVIDER_SITE_OTHER): Payer: Medicare Other | Admitting: Family

## 2017-01-31 ENCOUNTER — Encounter (HOSPITAL_COMMUNITY): Payer: Self-pay

## 2017-01-31 ENCOUNTER — Ambulatory Visit (HOSPITAL_COMMUNITY)
Admission: RE | Admit: 2017-01-31 | Discharge: 2017-01-31 | Disposition: A | Discharge status: Home / Self Care | Payer: Medicare Other | Source: Ambulatory Visit | Attending: Vascular Surgery | Admitting: Vascular Surgery

## 2017-01-31 ENCOUNTER — Encounter: Payer: Self-pay | Admitting: Family

## 2017-01-31 VITALS — BP 125/72 | HR 63 | Temp 98.1°F | Resp 18 | Wt 155.6 lb

## 2017-01-31 DIAGNOSIS — I6522 Occlusion and stenosis of left carotid artery: Secondary | ICD-10-CM | POA: Diagnosis not present

## 2017-01-31 DIAGNOSIS — I6521 Occlusion and stenosis of right carotid artery: Secondary | ICD-10-CM | POA: Diagnosis not present

## 2017-01-31 DIAGNOSIS — Z9889 Other specified postprocedural states: Secondary | ICD-10-CM

## 2017-01-31 DIAGNOSIS — I639 Cerebral infarction, unspecified: Secondary | ICD-10-CM

## 2017-01-31 LAB — VAS US CAROTID
LCCAPSYS: -110 cm/s
LEFT ECA DIAS: -8 cm/s
LEFT VERTEBRAL DIAS: -9 cm/s
Left CCA dist dias: 18 cm/s
Left CCA dist sys: 87 cm/s
Left CCA prox dias: -16 cm/s
Left ICA dist dias: -21 cm/s
Left ICA dist sys: -77 cm/s
Left ICA prox dias: -12 cm/s
Left ICA prox sys: -68 cm/s
RCCADSYS: -96 cm/s
RCCAPDIAS: 18 cm/s
RIGHT CCA MID DIAS: 13 cm/s
RIGHT ECA DIAS: -17 cm/s
RIGHT VERTEBRAL DIAS: -12 cm/s
Right CCA prox sys: 91 cm/s

## 2017-01-31 NOTE — Patient Instructions (Signed)

## 2017-01-31 NOTE — Progress Notes (Signed)
Chief Complaint: Follow up Extracranial Carotid Artery Stenosis   History of Present Illness  Stacy [name] is a 83 y.o. female who is s/p right carotid endarterectomy with patch angioplasty for asymptomatic right carotid stenosis on 04/13/2015 by Dr. Trula Slade. Intraoperative findings included an 80% stenosis.   Her pedal pulses were not palpable at a previous visit, she has no claudication symptoms, no non healing wounds.  Her ABI's in January 2016 were normal with all triphasic waveforms, TBI's were below normal.  She has right sciatic pain, was taking acupuncture for this, states this has not helped. Her walking is limited by her right sciatic pain.    She had an MI in 1997, angioplasty, had a cardiac stent placed in 2000. Dr. Sherren Mocha is her cardiologist.   She had a stroke in May 2018, as manifested bay left leg giving way, no left arm hemiparesis or hemiplegia, no speech or vision changes.  Shortly after this she had a Loop recorder placed and was found to be in atrial fib, was then started on Eliquis. Left leg weakness has resolved.    Diabetic: Yes, A1C on 11-27-16 was 7.6, she was not taking any glycemic lowering agents, states she has a prescription for metformin 500 mg bid and will start today, serum creatinine was 0.9, eGFR was 59 on same date Tobacco use: former smoker, quit in 2000, denies exposure to second hand smoke  Pt meds include: Statin :Yes Betablocker: Yes ASA: no Other anticoagulants/antiplatelets: Eliquis  Past Medical History:  Diagnosis Date  . Anxiety   . Arthritis    Cervical DDD C5-6 and C6-7  . Atheroembolism   . Blue toe syndrome ([place])   . CAD (coronary artery disease)   . Cancer ([place])    skin BCC  Forehead and  SCC Left Lower Leg  . Carotid artery occlusion   . Chronic kidney disease, stage III (moderate) (HCC)   . DDD (degenerative disc disease)   . Depression   . DM (diabetes mellitus) ([place])   . Gall stones   . Gallstones    . GERD (gastroesophageal reflux disease)   . Heart murmur   . Heart murmur   . Hemorrhoid   . Hemorrhoids   . Hiatal hernia   . History of shingles   . Hyperlipidemia   . Hypertension   . Leg pain   . Mild incontinence   . Multinodular goiter   . Multiple thyroid nodules    checked every year  . Myocardial infarction ([place]) 03/15/95  . Osteopenia   . Peptic ulcer disease   . Stenosis of right carotid artery   . Stomach cancer ([place]) 1980  . Stroke ([place])   . Thyroid nodule   . Vision abnormalities     Social History Social History   Tobacco Use  . Smoking status: Former Smoker    Packs/day: 1.00    Years: 54.00    Pack years: 54.00    Types: Cigarettes    Last attempt to quit: 10/05/1998    Years since quitting: 18.3  . Smokeless tobacco: Never Used  Substance Use Topics  . Alcohol use: No    Alcohol/week: 0.0 oz  . Drug use: No    Family History Family History  Problem Relation Age of Onset  . Diabetes Father   . Prostate cancer Father        meta to colon  . Cancer Father        Prostate  . Parkinsonism  Mother   . Stroke Paternal Uncle   . Hypertension Paternal Uncle   . Heart attack Neg Hx     Surgical History Past Surgical History:  Procedure Laterality Date  . 3 epidural steroid injections    . APPENDECTOMY    . Basal Cell Cancer Removal     forehead  . CATARACT EXTRACTION    . CATARACT EXTRACTION  12/29/2003  . CORONARY ANGIOPLASTY  03-15-1995  . CORONARY ANGIOPLASTY WITH STENT PLACEMENT  05-19-1998  . CORONARY STENT PLACEMENT  2000  . DILATION AND CURETTAGE OF UTERUS    . ENDARTERECTOMY Right 04/13/2015   Procedure: ENDARTERECTOMY CAROTID;  Surgeon: Serafina Mitchell, MD;  Location: [facility] OR;  Service: Vascular;  Laterality: Right;  . ENDOMETRIAL BIOPSY    . EYE SURGERY    . heart catherization    . HEMORRHOID SURGERY    . INCISIONAL HERNIA REPAIR    . INGUINAL HERNIA REPAIR    . LOOP RECORDER INSERTION N/A 06/24/2016   Procedure: Loop  Recorder Insertion;  Surgeon: Deboraha Sprang, MD;  Location: [place] [facility];  Service: Cardiovascular;  Laterality: N/A;  . PATCH ANGIOPLASTY Right 04/13/2015   Procedure: PATCH ANGIOPLASTY;  Surgeon: Serafina Mitchell, MD;  Location: [facility] OR;  Service: Vascular;  Laterality: Right;  . PTCA    . SQUAMOUS CELL CARCINOMA EXCISION     left lower leg  . TEE WITHOUT CARDIOVERSION N/A 06/24/2016   Procedure: TRANSESOPHAGEAL ECHOCARDIOGRAM (TEE);  Surgeon: Dorothy Spark, MD;  Location: [facility] ENDOSCOPY;  Service: Cardiovascular;  Laterality: N/A;  . TONSILLECTOMY      Allergies  Allergen Reactions  . Iohexol Other (See Comments)     Desc: HIVES- 13 HR PRE-MEDS ARE REQUIRED-ASM- 03/21/05,SULFA,ADHESIVE TAPE   . Sulfonamide Derivatives Hives    REACTION: hives  . Latex Other (See Comments)    Adhesive tape and EKG adhesive leads to rash  . Tranilast Hives    Investigational medication  . Ivp Dye [Iodinated Diagnostic Agents]   . Metformin And Related Diarrhea  . Tape     Adhesive tap    Current Outpatient Medications  Medication Sig Dispense Refill  . ACCU-CHEK SMARTVIEW test strip 1 each by Other route as needed for other (blood sugar test strips).   11  . acetaminophen (TYLENOL) 325 MG tablet Take 325 mg by mouth every 6 (six) hours as needed for mild pain or moderate pain.    Marland Kitchen albuterol (PROVENTIL HFA;VENTOLIN HFA) 108 (90 Base) MCG/ACT inhaler Inhale 1-2 puffs into the lungs every 6 (six) hours as needed for wheezing or shortness of breath. 1 Inhaler 0  . apixaban (ELIQUIS) 5 MG TABS tablet Take 1 tablet (5 mg total) by mouth 2 (two) times daily. 180 tablet 3  . cholecalciferol (VITAMIN D) 400 UNITS TABS Take 400 Units by mouth daily.     . clotrimazole (LOTRIMIN) 1 % cream Apply to affected area 2 times daily for up to 4 weeks 15 g 1  . Coenzyme Q10 (COQ-10 PO) Take 50 mg by mouth daily. 1 tab po qd    . conjugated estrogens (PREMARIN) vaginal cream Place 1 g vaginally once a week.  .625 mg once weekly    . dexlansoprazole (DEXILANT) 60 MG capsule Take 60 mg by mouth daily as needed (for acid reflux). Take 1 tablet ( Dexilant DR ) by mouth once daily    . diphenhydrAMINE (BENADRYL) 25 mg capsule Take 25 mg by mouth as needed for allergies.     Marland Kitchen  docusate sodium (COLACE) 100 MG capsule Take 100 mg by mouth daily as needed.     . gabapentin (NEURONTIN) 100 MG capsule Take 100 mg by mouth 2 (two) times daily.     . hydrochlorothiazide (MICROZIDE) 12.5 MG capsule Take 1 capsule (12.5 mg total) by mouth daily. 90 capsule 3  . hydrocortisone 2.5 % cream Apply 1 application topically daily as needed (for itching).   1  . Melatonin 5 MG CAPS Take 1 capsule by mouth at bedtime as needed (sleep).     . menthol-zinc oxide (GOLD BOND) powder Apply 1 application topically 2 (two) times daily as needed (freshness.).    Marland Kitchen metoprolol succinate (TOPROL-XL) 25 MG 24 hr tablet TAKE 1 TABLET BY MOUTH EVERY DAY 90 tablet 3  . Multiple Vitamins-Minerals (ICAPS AREDS 2 PO) Take by mouth.    . NEOMYCIN-POLYMYXIN-HYDROCORTISONE (CORTISPORIN) 1 % SOLN otic solution Place 2 drops into both ears as directed.   3  . nitroGLYCERIN (NITROSTAT) 0.4 MG SL tablet Place 1 tablet (0.4 mg total) under the tongue every 5 (five) minutes as needed for chest pain. 25 tablet 3  . Polyethylene Glycol 400 (BLINK TEARS OP) Place 1 drop into both eyes daily as needed (for dry eye). Reported on 05/05/2015    . polyethylene glycol powder (GLYCOLAX/MIRALAX) powder Take 17 g by mouth daily. Reported on 06/12/2015    . ramipril (ALTACE) 2.5 MG capsule TAKE 1 CAPSULE (2.5 MG TOTAL) BY MOUTH 2 (TWO) TIMES DAILY. 180 capsule 3  . rosuvastatin (CRESTOR) 10 MG tablet Take 1 tablet (10 mg total) by mouth daily. 90 tablet 3  . sertraline (ZOLOFT) 25 MG tablet Take 25 mg by mouth daily.     Marland Kitchen trolamine salicylate (ASPERCREME) 10 % cream Apply 1 application topically as needed for muscle pain.     No current facility-administered  medications for this visit.     Review of Systems : See HPI for pertinent positives and negatives.  Physical Examination  Vitals:   01/31/17 1535 01/31/17 1536  BP: 126/75 125/72  Pulse: 63   Resp: 18   Temp: 98.1 F (36.7 C)   TempSrc: Oral   SpO2: 95%   Weight: 155 lb 9.6 oz (70.6 kg)    Body mass index is 27.13 kg/m.  General: WDWN in NAD Gait: Normal HENT: WNL Eyes: Pupils equal Pulmonary: normal non-labored breathing , CTAB Cardiac: regular rate and rhythm, no murmur detected  Abdomen: soft, NT, no palpable masses Skin: no rashes, no ulcers, no cellulitis.  VASCULAR EXAM  Carotid Bruits Left Right   Negative Negative  Radial pulses: 2+ palpable and = Aorta is not palpable   VASCULAR EXAM: Extremitieswithout ischemic changes  without Gangrene; without open wounds.     LE Pulses LEFT RIGHT   FEMORAL 2+ palpable not palpable    POPLITEAL not palpable  not palpable   POSTERIOR TIBIAL not palpable  not palpable    DORSALIS PEDIS  ANTERIOR TIBIAL palpable  palpable     Musculoskeletal: no muscle wasting or atrophy; no peripheral edema Neurologic:A&O X 3; Appropriate Affect ;  SENSATION: normal; MOTOR FUNCTION: 5/5 Symmetric, CN 2-12 intact Speech is fluent/normal      Assessment: Stacy [name] is a 83 y.o. female who is s/p right carotid endarterectomy with patch angioplasty for asymptomatic right carotid stenosis on 04/13/2015.  She had a small stroke in May 2018, infarct centered in the anterior body of corpus callosum extending into right frontal periventricular white matter.  Stroke manifested as left leg giving way, no left arm sx's, no vision or speech changes. She has no residual  left leg weakness.  Cardiac loop recorder about that time showed atrial fib, she was started on Eliquis.   Her atherosclerotic risk factors include almost in control DM, CAD, and former smoker.  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  Pt had some imaging in the past which indicated a small AAA; but 4 serial abdominal duplex have indicated no evidence of AAA. Will no longer need to monitor for AAA.    DATA  Carotid Duplex (01/31/17): Right ICA: CEA site, with no restenosis. Left ICA: <40% stenosis. Bilateral vertebral artery flow is antegrade.  Bilateral subclavian artery waveforms are normal.  No change compared to exam on 06-23-16  MR Brain w/o contrast (06-23-16): IMPRESSION: 1. Small acute/early subacute infarction centered in the anterior body of corpus callosum extending into right frontal periventricular white matter. No acute hemorrhage identified. 2. Mild progression of chronic microvascular ischemic changes and parenchymal volume loss of the brain from 2008.   Plan: Follow-up in 1 year with Carotid Duplex scan.   I discussed in depth with the patient the nature of atherosclerosis, and emphasized the importance of maximal medical management including strict control of blood pressure, blood glucose, and lipid levels, obtaining regular exercise, and continued cessation of smoking.  The patient is aware that without maximal medical management the underlying atherosclerotic disease process will progress, limiting the benefit of any interventions. The patient was given information about stroke prevention and what symptoms should prompt the patient to seek immediate medical care. Thank you for allowing [place] to participate in this patient's care.  Clemon Chambers, RN, MSN, FNP-C [facility] Office: [phone]  Clinic Physician: Donzetta Matters  01/31/17 3:39 PM

## 2017-02-03 NOTE — Addendum Note (Signed)
Addended by: Lianne Cure A on: 02/03/2017 10:10 AM   Modules accepted: Orders

## 2017-02-05 ENCOUNTER — Encounter (HOSPITAL_COMMUNITY)
Admission: RE | Admit: 2017-02-05 | Discharge: 2017-02-05 | Disposition: A | Discharge status: Home / Self Care | Payer: BLUE CROSS/BLUE SHIELD | Source: Ambulatory Visit | Attending: Cardiovascular Disease | Admitting: Cardiovascular Disease

## 2017-02-07 ENCOUNTER — Encounter (HOSPITAL_COMMUNITY)
Admission: RE | Admit: 2017-02-07 | Discharge: 2017-02-07 | Disposition: A | Discharge status: Home / Self Care | Payer: Self-pay | Source: Ambulatory Visit | Attending: Cardiovascular Disease | Admitting: Cardiovascular Disease

## 2017-02-10 ENCOUNTER — Encounter (HOSPITAL_COMMUNITY)
Admission: RE | Admit: 2017-02-10 | Discharge: 2017-02-10 | Disposition: A | Discharge status: Home / Self Care | Payer: Self-pay | Source: Ambulatory Visit | Attending: Cardiovascular Disease | Admitting: Cardiovascular Disease

## 2017-02-12 ENCOUNTER — Encounter (HOSPITAL_COMMUNITY)
Admission: RE | Admit: 2017-02-12 | Discharge: 2017-02-12 | Disposition: A | Discharge status: Home / Self Care | Payer: Self-pay | Source: Ambulatory Visit | Attending: Cardiovascular Disease | Admitting: Cardiovascular Disease

## 2017-02-12 DIAGNOSIS — I2510 Atherosclerotic heart disease of native coronary artery without angina pectoris: Secondary | ICD-10-CM | POA: Insufficient documentation

## 2017-02-14 ENCOUNTER — Encounter (HOSPITAL_COMMUNITY): Payer: Self-pay

## 2017-02-17 ENCOUNTER — Encounter (HOSPITAL_COMMUNITY)
Admission: RE | Admit: 2017-02-17 | Discharge: 2017-02-17 | Disposition: A | Discharge status: Home / Self Care | Payer: Self-pay | Source: Ambulatory Visit | Attending: Cardiovascular Disease | Admitting: Cardiovascular Disease

## 2017-02-19 ENCOUNTER — Encounter (HOSPITAL_COMMUNITY)
Admission: RE | Admit: 2017-02-19 | Discharge: 2017-02-19 | Disposition: A | Discharge status: Home / Self Care | Payer: Self-pay | Source: Ambulatory Visit | Attending: Cardiovascular Disease | Admitting: Cardiovascular Disease

## 2017-02-19 ENCOUNTER — Ambulatory Visit (INDEPENDENT_AMBULATORY_CARE_PROVIDER_SITE_OTHER): Payer: Medicare Other | Admitting: *Deleted

## 2017-02-19 DIAGNOSIS — I639 Cerebral infarction, unspecified: Secondary | ICD-10-CM

## 2017-02-20 NOTE — Progress Notes (Signed)
Carelink Summary Report / Loop recorder 

## 2017-02-21 ENCOUNTER — Encounter (HOSPITAL_COMMUNITY)
Admission: RE | Admit: 2017-02-21 | Discharge: 2017-02-21 | Disposition: A | Discharge status: Home / Self Care | Payer: BLUE CROSS/BLUE SHIELD | Source: Ambulatory Visit | Attending: Cardiovascular Disease | Admitting: Cardiovascular Disease

## 2017-02-24 ENCOUNTER — Encounter (HOSPITAL_COMMUNITY)
Admission: RE | Admit: 2017-02-24 | Discharge: 2017-02-24 | Disposition: A | Discharge status: Home / Self Care | Payer: Self-pay | Source: Ambulatory Visit | Attending: Cardiovascular Disease | Admitting: Cardiovascular Disease

## 2017-02-26 ENCOUNTER — Encounter (HOSPITAL_COMMUNITY): Payer: Self-pay

## 2017-02-28 ENCOUNTER — Encounter (HOSPITAL_COMMUNITY)
Admission: RE | Admit: 2017-02-28 | Discharge: 2017-02-28 | Disposition: A | Discharge status: Home / Self Care | Payer: Self-pay | Source: Ambulatory Visit | Attending: Cardiovascular Disease | Admitting: Cardiovascular Disease

## 2017-03-03 ENCOUNTER — Encounter (HOSPITAL_COMMUNITY)
Admission: RE | Admit: 2017-03-03 | Discharge: 2017-03-03 | Disposition: A | Discharge status: Home / Self Care | Payer: Medicare Other | Source: Ambulatory Visit | Attending: Cardiovascular Disease | Admitting: Cardiovascular Disease

## 2017-03-04 ENCOUNTER — Ambulatory Visit (INDEPENDENT_AMBULATORY_CARE_PROVIDER_SITE_OTHER): Payer: Medicare Other | Admitting: Nurse Practitioner

## 2017-03-04 ENCOUNTER — Encounter: Payer: Self-pay | Admitting: Nurse Practitioner

## 2017-03-04 VITALS — BP 124/68 | HR 57 | Wt 156.0 lb

## 2017-03-04 DIAGNOSIS — I10 Essential (primary) hypertension: Secondary | ICD-10-CM | POA: Diagnosis not present

## 2017-03-04 DIAGNOSIS — I480 Paroxysmal atrial fibrillation: Secondary | ICD-10-CM

## 2017-03-04 DIAGNOSIS — I639 Cerebral infarction, unspecified: Secondary | ICD-10-CM | POA: Diagnosis not present

## 2017-03-04 DIAGNOSIS — E785 Hyperlipidemia, unspecified: Secondary | ICD-10-CM

## 2017-03-04 LAB — CUP PACEART REMOTE DEVICE CHECK
Date Time Interrogation Session: 20190110031109
MDC IDC PG IMPLANT DT: 20180514

## 2017-03-04 NOTE — Patient Instructions (Addendum)
Stressed the importance of management of risk factors to prevent further stroke Continue Eliquis for secondary stroke prevention and atrial fibrillation  Maintain strict control of hypertension with blood pressure goal below 130/90, today's reading 124/68 continue antihypertensive medications Control of diabetes with hemoglobin A1c below 6.5 followed by primary care most recent hemoglobin A1c 7.6 on 11/26/16 continue diabetic medications Cholesterol with LDL cholesterol less than 70, followed by primary care, continue Crestor Exercise by walking, patient currently goes to cardiac rehab 3 times a week   eat healthy diet with whole grains,  fresh fruits and vegetables Discharge from stroke clinic   Stroke Prevention Some medical conditions and behaviors are associated with a higher chance of having a stroke. You can help prevent a stroke by making nutrition, lifestyle, and other changes, including managing any medical conditions you may have. What nutrition changes can be made?  Eat healthy foods. You can do this by: ? Choosing foods high in fiber, such as fresh fruits and vegetables and whole grains. ? Eating at least 5 or more servings of fruits and vegetables a day. Try to fill half of your plate at each meal with fruits and vegetables. ? Choosing lean protein foods, such as lean cuts of meat, poultry without skin, fish, tofu, beans, and nuts. ? Eating low-fat dairy products. ? Avoiding foods that are high in salt (sodium). This can help lower blood pressure. ? Avoiding foods that have saturated fat, trans fat, and cholesterol. This can help prevent high cholesterol. ? Avoiding processed and premade foods.  Follow your health care provider's specific guidelines for losing weight, controlling high blood pressure (hypertension), lowering high cholesterol, and managing diabetes. These may include: ? Reducing your daily calorie intake. ? Limiting your daily sodium intake to 1,500 milligrams  (mg). ? Using only healthy fats for cooking, such as olive oil, canola oil, or sunflower oil. ? Counting your daily carbohydrate intake. What lifestyle changes can be made?  Maintain a healthy weight. Talk to your health care provider about your ideal weight.  Get at least 30 minutes of moderate physical activity at least 5 days a week. Moderate activity includes brisk walking, biking, and swimming.  Do not use any products that contain nicotine or tobacco, such as cigarettes and e-cigarettes. If you need help quitting, ask your health care provider. It may also be helpful to avoid exposure to secondhand smoke.  Limit alcohol intake to no more than 1 drink a day for nonpregnant women and 2 drinks a day for men. One drink equals 12 oz of beer, 5 oz of wine, or 1 oz of hard liquor.  Stop any illegal drug use.  Avoid taking birth control pills. Talk to your health care provider about the risks of taking birth control pills if: ? You are over 44 years old. ? You smoke. ? You get migraines. ? You have ever had a blood clot. What other changes can be made?  Manage your cholesterol levels. ? Eating a healthy diet is important for preventing high cholesterol. If cholesterol cannot be managed through diet alone, you may also need to take medicines. ? Take any prescribed medicines to control your cholesterol as told by your health care provider.  Manage your diabetes. ? Eating a healthy diet and exercising regularly are important parts of managing your blood sugar. If your blood sugar cannot be managed through diet and exercise, you may need to take medicines. ? Take any prescribed medicines to control your diabetes as told by  your health care provider.  Control your hypertension. ? To reduce your risk of stroke, try to keep your blood pressure below 130/80. ? Eating a healthy diet and exercising regularly are an important part of controlling your blood pressure. If your blood pressure cannot  be managed through diet and exercise, you may need to take medicines. ? Take any prescribed medicines to control hypertension as told by your health care provider. ? Ask your health care provider if you should monitor your blood pressure at home. ? Have your blood pressure checked every year, even if your blood pressure is normal. Blood pressure increases with age and some medical conditions.  Get evaluated for sleep disorders (sleep apnea). Talk to your health care provider about getting a sleep evaluation if you snore a lot or have excessive sleepiness.  Take over-the-counter and prescription medicines only as told by your health care provider. Aspirin or blood thinners (antiplatelets or anticoagulants) may be recommended to reduce your risk of forming blood clots that can lead to stroke.  Make sure that any other medical conditions you have, such as atrial fibrillation or atherosclerosis, are managed. What are the warning signs of a stroke? The warning signs of a stroke can be easily remembered as BEFAST.  B is for balance. Signs include: ? Dizziness. ? Loss of balance or coordination. ? Sudden trouble walking.  E is for eyes. Signs include: ? A sudden change in vision. ? Trouble seeing.  F is for face. Signs include: ? Sudden weakness or numbness of the face. ? The face or eyelid drooping to one side.  A is for arms. Signs include: ? Sudden weakness or numbness of the arm, usually on one side of the body.  S is for speech. Signs include: ? Trouble speaking (aphasia). ? Trouble understanding.  T is for time. ? These symptoms may represent a serious problem that is an emergency. Do not wait to see if the symptoms will go away. Get medical help right away. Call your local emergency services (911 in the [place] Do not drive yourself to the hospital.  Other signs of stroke may include: ? A sudden, severe headache with no known cause. ? Nausea or vomiting. ? Seizure.  Where to  find more information: For more information, visit:  American Stroke Association: www.strokeassociation.org  National Stroke Association: www.stroke.org  Summary  You can prevent a stroke by eating healthy, exercising, not smoking, limiting alcohol intake, and managing any medical conditions you may have.  Do not use any products that contain nicotine or tobacco, such as cigarettes and e-cigarettes. If you need help quitting, ask your health care provider. It may also be helpful to avoid exposure to secondhand smoke.  Remember BEFAST for warning signs of stroke. Get help right away if you or a loved one has any of these signs. This information is not intended to replace advice given to you by your health care provider. Make sure you discuss any questions you have with your health care provider. Document Released: 03/07/2004 Document Revised: 03/05/2016 Document Reviewed: 03/05/2016 Elsevier Interactive Patient Education  [employer].

## 2017-03-04 NOTE — Progress Notes (Signed)
[facility]  PATIENT: Stacy [name] DOB: 1926/02/19   REASON FOR VISIT: Follow-up for stroke May 2018 HISTORY FROM: Patient and daughter    HISTORY OF PRESENT ILLNESS:08/20/16 PS89 year Caucasian lady seen today for first office follow-up visit following hospital admission for stroke in May 2018. She is accompanied by her daughter. History is obtained from both of them as well as review of electronic medical records and have personally reviewed imaging. films and stroke work up. Stacy [name] a 84 y.o.femalewith a history of right ICA stenosis who presents with left leg weakness. He states that it is coming gone over the course of the evening, for scoring around 6:30 PM. She states that most recently, it happened even while she was in the ER after she got up to go the bathroom. Does not present when laying down.LKW: 6:30 PM 06/22/16. tpa given?: no, symptoms resolved, mild symptoms. MRI scan of the brain shows small acute infarct involving anterior part of the body of corpus callosum extending into the periventricular white matter. Transthoracic echo showed normal ejection fraction slightly diminished at 40-45% with a candidate for Zometa PICA and anterior apical myocardium. MRA of the brain showed no large vessel stenosis or occlusion. Carotid ultrasound was unremarkable. Lower extremity venous Doppler was negative for DVT. LDL cholesterol was 37 mg percent. Hemoglobin A1c was 7.2. Patient was previously on aspirin and Plavix which was continued due to history of cardiac stents. Patient's leg weakness improved and therapy recommended no outpatient needs. Patient had transesophageal echocardiogram done which did not show cardiac source of embolism. Loop recorder was inserted and on 08/02/16 paroxysmal atrial fibrillation was found for 7 hours. Patient was switched from aspirin and Plavix to eliquis. She is tolerating it well without bruising or bleeding but has been complaining of  generalized itching which she seems to become winces from the eliquis. She has seen Dr. Burt Knack cardiologist and had nuclear stress test done which showed no further progression of cardiac ischemia and she was advised to stay on eliquis alone. She does complain of back pain as well as sciatica in the right leg following a fall which occurred 6 days after hospital discharge from his recent stroke. She may need epidural steroid injection in the near future. She states her blood pressure well controlled today it is 107/55. She is tolerating Crestor well without any side effects  UPDATE 1/22/2019CM.  Stacy [name], 84 year old female returns for follow-up with history of stroke event in May 2018.  Loop recorder showed atrial fibrillation within 7 hours of insertion and she was placed on Eliquis.  She remains on Eliquis for secondary stroke prevention and atrial fibrillation with minimal bruising and no bleeding.  She has not had further stroke or TIA symptoms.  She remains on Crestor for hyperlipidemia.  Recent lipids by primary care 11/27/2016, total cholesterol 120, triglycerides 140, HDL 49 LDL 71.  Hemoglobin A1c 7.6 blood pressure in the office today 124/68.  She continues to see Dr. Burt Knack.  She has no new neurologic complaints.  She remains active independent in all ADLs continues to drive.  She goes to cardiac rehab 3 days a week.  She returns for reevaluation   REVIEW OF SYSTEMS: Full 14 system review of systems performed and notable only for those listed, all others are neg:  Constitutional: neg  Cardiovascular: neg Ear/Nose/Throat: neg  Skin: neg Eyes: neg Respiratory: neg Gastroitestinal: neg  Hematology/Lymphatic: neg  Endocrine: neg Musculoskeletal:neg Allergy/Immunology: neg Neurological: neg Psychiatric: neg  Sleep : neg   ALLERGIES: Allergies  Allergen Reactions  . Iohexol Other (See Comments)     Desc: HIVES- 13 HR PRE-MEDS ARE REQUIRED-ASM- 03/21/05,SULFA,ADHESIVE TAPE   .  Sulfonamide Derivatives Hives    REACTION: hives  . Latex Other (See Comments)    Adhesive tape and EKG adhesive leads to rash  . Tranilast Hives    Investigational medication  . Ivp Dye [Iodinated Diagnostic Agents]   . Metformin And Related Diarrhea    denies  . Tape     Adhesive tap    HOME MEDICATIONS: Outpatient Medications Prior to Visit  Medication Sig Dispense Refill  . ACCU-CHEK SMARTVIEW test strip 1 each by Other route as needed for other (blood sugar test strips).   11  . acetaminophen (TYLENOL) 325 MG tablet Take 325 mg by mouth every 6 (six) hours as needed for mild pain or moderate pain.    Marland Kitchen albuterol (PROVENTIL HFA;VENTOLIN HFA) 108 (90 Base) MCG/ACT inhaler Inhale 1-2 puffs into the lungs every 6 (six) hours as needed for wheezing or shortness of breath. 1 Inhaler 0  . apixaban (ELIQUIS) 5 MG TABS tablet Take 1 tablet (5 mg total) by mouth 2 (two) times daily. 180 tablet 3  . cholecalciferol (VITAMIN D) 400 UNITS TABS Take 400 Units by mouth daily.     . Coenzyme Q10 (COQ-10 PO) Take 50 mg by mouth daily. 1 tab po qd    . conjugated estrogens (PREMARIN) vaginal cream Place 1 g vaginally once a week. .625 mg once weekly    . dexlansoprazole (DEXILANT) 60 MG capsule Take 60 mg by mouth daily as needed (for acid reflux). Take 1 tablet ( Dexilant DR ) by mouth once daily    . diphenhydrAMINE (BENADRYL) 25 mg capsule Take 25 mg by mouth as needed for allergies.     Marland Kitchen docusate sodium (COLACE) 100 MG capsule Take 100 mg by mouth daily as needed.     . gabapentin (NEURONTIN) 100 MG capsule Take 100 mg by mouth 2 (two) times daily.     . hydrochlorothiazide (MICROZIDE) 12.5 MG capsule Take 1 capsule (12.5 mg total) by mouth daily. 90 capsule 3  . hydrocortisone 2.5 % cream Apply 1 application topically daily as needed (for itching).   1  . Melatonin 5 MG CAPS Take 1 capsule by mouth at bedtime as needed (sleep).     . menthol-zinc oxide (GOLD BOND) powder Apply 1 application  topically 2 (two) times daily as needed (freshness.).    Marland Kitchen metFORMIN (GLUCOPHAGE) 500 MG tablet Take 500 mg by mouth 2 (two) times daily.    . metoprolol succinate (TOPROL-XL) 25 MG 24 hr tablet TAKE 1 TABLET BY MOUTH EVERY DAY 90 tablet 3  . Multiple Vitamins-Minerals (ICAPS AREDS 2 PO) Take by mouth.    . NEOMYCIN-POLYMYXIN-HYDROCORTISONE (CORTISPORIN) 1 % SOLN otic solution Place 2 drops into both ears as directed.   3  . nitroGLYCERIN (NITROSTAT) 0.4 MG SL tablet Place 1 tablet (0.4 mg total) under the tongue every 5 (five) minutes as needed for chest pain. 25 tablet 3  . Polyethylene Glycol 400 (BLINK TEARS OP) Place 1 drop into both eyes daily as needed (for dry eye). Reported on 05/05/2015    . ramipril (ALTACE) 2.5 MG capsule TAKE 1 CAPSULE (2.5 MG TOTAL) BY MOUTH 2 (TWO) TIMES DAILY. 180 capsule 3  . rosuvastatin (CRESTOR) 10 MG tablet Take 1 tablet (10 mg total) by mouth daily. 90 tablet 3  .  sertraline (ZOLOFT) 25 MG tablet Take 25 mg by mouth daily.     Marland Kitchen trolamine salicylate (ASPERCREME) 10 % cream Apply 1 application topically as needed for muscle pain.     No facility-administered medications prior to visit.     PAST MEDICAL HISTORY: Past Medical History:  Diagnosis Date  . Anxiety   . Arthritis    Cervical DDD C5-6 and C6-7  . Atheroembolism   . Blue toe syndrome ([place])   . CAD (coronary artery disease)   . Cancer ([place])    skin BCC  Forehead and  SCC Left Lower Leg  . Carotid artery occlusion   . Chronic kidney disease, stage III (moderate) (HCC)   . DDD (degenerative disc disease)   . Depression   . DM (diabetes mellitus) ([place])   . Gall stones   . Gallstones   . GERD (gastroesophageal reflux disease)   . Heart murmur   . Heart murmur   . Hemorrhoid   . Hemorrhoids   . Hiatal hernia   . History of shingles   . Hyperlipidemia   . Hypertension   . Leg pain   . Mild incontinence   . Multinodular goiter   . Multiple thyroid nodules    checked every year  .  Myocardial infarction ([place]) 03/15/95  . Osteopenia   . Peptic ulcer disease   . Stenosis of right carotid artery   . Stomach cancer ([place]) 1980  . Stroke ([place])   . Thyroid nodule   . Vision abnormalities     PAST SURGICAL HISTORY: Past Surgical History:  Procedure Laterality Date  . 3 epidural steroid injections    . APPENDECTOMY    . Basal Cell Cancer Removal     forehead  . CATARACT EXTRACTION    . CATARACT EXTRACTION  12/29/2003  . CORONARY ANGIOPLASTY  03-15-1995  . CORONARY ANGIOPLASTY WITH STENT PLACEMENT  05-19-1998  . CORONARY STENT PLACEMENT  2000  . DILATION AND CURETTAGE OF UTERUS    . ENDARTERECTOMY Right 04/13/2015   Procedure: ENDARTERECTOMY CAROTID;  Surgeon: Serafina Mitchell, MD;  Location: [facility] OR;  Service: Vascular;  Laterality: Right;  . ENDOMETRIAL BIOPSY    . EYE SURGERY    . heart catherization    . HEMORRHOID SURGERY    . INCISIONAL HERNIA REPAIR    . INGUINAL HERNIA REPAIR    . LOOP RECORDER INSERTION N/A 06/24/2016   Procedure: Loop Recorder Insertion;  Surgeon: Deboraha Sprang, MD;  Location: [facility] LAB;  Service: Cardiovascular;  Laterality: N/A;  . PATCH ANGIOPLASTY Right 04/13/2015   Procedure: PATCH ANGIOPLASTY;  Surgeon: Serafina Mitchell, MD;  Location: [facility] OR;  Service: Vascular;  Laterality: Right;  . PTCA    . SQUAMOUS CELL CARCINOMA EXCISION     left lower leg  . TEE WITHOUT CARDIOVERSION N/A 06/24/2016   Procedure: TRANSESOPHAGEAL ECHOCARDIOGRAM (TEE);  Surgeon: Dorothy Spark, MD;  Location: [facility] ENDOSCOPY;  Service: Cardiovascular;  Laterality: N/A;  . TONSILLECTOMY      FAMILY HISTORY: Family History  Problem Relation Age of Onset  . Diabetes Father   . Prostate cancer Father        meta to colon  . Cancer Father        Prostate  . Parkinsonism Mother   . Stroke Paternal Uncle   . Hypertension Paternal Uncle   . Heart attack Neg Hx     SOCIAL HISTORY: Social History   Socioeconomic History  . Marital  status: Widowed     Spouse name: Not on file  . Number of children: 1  . Years of education: Not on file  . Highest education level: Not on file  Social Needs  . Financial resource strain: Not on file  . Food insecurity - worry: Not on file  . Food insecurity - inability: Not on file  . Transportation needs - medical: Not on file  . Transportation needs - non-medical: Not on file  Occupational History  . 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 Smoking status: Former Smoker    Packs/day: 1.00    Years: 54.00    Pack years: 54.00    Types: Cigarettes    Last attempt to quit: 10/05/1998    Years since quitting: 18.4  . Smokeless tobacco: Never Used  Substance and Sexual Activity  . Alcohol use: No    Alcohol/week: 0.0 oz  . Drug use: No  . Sexual activity: No  Other Topics Concern  . Not on file  Social History Narrative  . Not on file     PHYSICAL EXAM  Vitals:   03/04/17 1324  BP: 124/68  Pulse: (!) 57  Weight: 156 lb (70.8 kg)   Body mass index is 27.2 kg/m.  Generalized: Well developed, in no acute distress  Head: normocephalic and atraumatic,. Oropharynx benign  Neck: Supple, no carotid bruits  Cardiac: Regular rate rhythm, no murmur  Musculoskeletal: No deformity   Neurological examination   Mentation: Alert oriented to time, place, history taking. Attention span and concentration appropriate. Recent and remote memory intact.  Follows all commands speech and language fluent.   Cranial nerve II-XII: Pupils were equal round reactive to light extraocular movements were full, visual field were full on confrontational test. Facial sensation and strength were normal. hearing was intact to finger rubbing bilaterally. Uvula tongue midline. head turning and shoulder shrug were normal and symmetric.Tongue protrusion into cheek strength was normal. Motor: normal bulk and tone, full strength in the BUE, BLE,  Sensory: normal and symmetric to  light touch,  Coordination: finger-nose-finger, heel-to-shin bilaterally, no dysmetria, no tremor Reflexes: 1+ of her lower and symmetric plantar responses were flexor bilaterally. Gait and Station: Rising up from seated position without assistance, normal stance,  moderate stride, good arm swing, smooth turning, able to perform tiptoe, and heel walking without difficulty. Tandem gait is  mildly unsteady  DIAGNOSTIC DATA (LABS, IMAGING, TESTING) - I reviewed patient records, labs, notes, testing and imaging myself where available.  Lab Results  Component Value Date   WBC 7.7 08/29/2016   HGB 14.9 08/29/2016   HCT 46.1 (H) 08/29/2016   MCV 89.9 08/29/2016   PLT 219 08/29/2016      Component Value Date/Time   NA 137 08/29/2016 1609   K 3.9 08/29/2016 1609   CL 101 08/29/2016 1609   CO2 27 08/29/2016 1609   GLUCOSE 111 (H) 08/29/2016 1609   BUN 13 08/29/2016 1609   CREATININE 0.96 08/29/2016 1609   CALCIUM 9.6 08/29/2016 1609   PROT 5.6 (L) 06/24/2016 0215   ALBUMIN 3.2 (L) 06/24/2016 0215   AST 27 06/24/2016 0215   ALT 26 06/24/2016 0215   ALKPHOS 53 06/24/2016 0215   BILITOT 0.6 06/24/2016 0215   GFRNONAA 51 (L) 08/29/2016 1609   GFRAA 59 (L) 08/29/2016 1609   Lab Results  Component Value Date   CHOL 102 06/23/2016   HDL 42 06/23/2016  [place] 37 06/23/2016   TRIG 116 06/23/2016   CHOLHDL 2.4 06/23/2016   Lab Results  Component Value Date   HGBA1C 7.2 (H) 06/23/2016      ASSESSMENT AND PLAN 60 year Caucasian lady with embolic right anterior cerebral artery branch infarct in May 2018 secondary to paroxysmal atrial fibrillation. She is doing clinically quite well with full recovery. The patient is a current patient of Dr. Leonie Man  who is out of the office today . This note is sent to the work in doctor.       PLAN:Stressed the importance of management of risk factors to prevent further stroke Continue Eliquis for secondary stroke prevention and atrial  fibrillation  Maintain strict control of hypertension with blood pressure goal below 130/90, today's reading 124/68 continue antihypertensive medications Control of diabetes with hemoglobin A1c below 6.5 followed by primary care most recent hemoglobin A1c 7.6 on 11/26/16 continue diabetic medications Cholesterol with LDL cholesterol less than 70, followed by primary care, continue Crestor Exercise by walking, patient currently goes to cardiac rehab 3 times a week   eat healthy diet with whole grains,  fresh fruits and vegetables Discharge from stroke clinic I spent 25 minutes in total face to face time with the patient more than 50% of which was spent counseling and coordination of care, reviewing test results reviewing medications and discussing and reviewing the diagnosis of stroke and management of risk factors.  Written information given Dennie Bible, [facility], [facility], APRN  [facility] [address] [place], [place] [address] [phone]

## 2017-03-05 ENCOUNTER — Encounter (HOSPITAL_COMMUNITY)
Admission: RE | Admit: 2017-03-05 | Discharge: 2017-03-05 | Disposition: A | Discharge status: Home / Self Care | Payer: Self-pay | Source: Ambulatory Visit | Attending: Cardiovascular Disease | Admitting: Cardiovascular Disease

## 2017-03-07 ENCOUNTER — Encounter (HOSPITAL_COMMUNITY)
Admission: RE | Admit: 2017-03-07 | Discharge: 2017-03-07 | Disposition: A | Discharge status: Home / Self Care | Payer: Medicare Other | Source: Ambulatory Visit | Attending: Cardiovascular Disease | Admitting: Cardiovascular Disease

## 2017-03-10 ENCOUNTER — Encounter (HOSPITAL_COMMUNITY)
Admission: RE | Admit: 2017-03-10 | Discharge: 2017-03-10 | Disposition: A | Discharge status: Home / Self Care | Payer: Self-pay | Source: Ambulatory Visit | Attending: Cardiovascular Disease | Admitting: Cardiovascular Disease

## 2017-03-12 ENCOUNTER — Encounter (HOSPITAL_COMMUNITY)
Admission: RE | Admit: 2017-03-12 | Discharge: 2017-03-12 | Disposition: A | Discharge status: Home / Self Care | Payer: Self-pay | Source: Ambulatory Visit | Attending: Cardiovascular Disease | Admitting: Cardiovascular Disease

## 2017-03-14 ENCOUNTER — Encounter (HOSPITAL_COMMUNITY)
Admission: RE | Admit: 2017-03-14 | Discharge: 2017-03-14 | Disposition: A | Discharge status: Home / Self Care | Payer: Self-pay | Source: Ambulatory Visit | Attending: Cardiovascular Disease | Admitting: Cardiovascular Disease

## 2017-03-14 DIAGNOSIS — I2510 Atherosclerotic heart disease of native coronary artery without angina pectoris: Secondary | ICD-10-CM | POA: Insufficient documentation

## 2017-03-17 ENCOUNTER — Encounter (HOSPITAL_COMMUNITY)
Admission: RE | Admit: 2017-03-17 | Discharge: 2017-03-17 | Disposition: A | Discharge status: Home / Self Care | Payer: Self-pay | Source: Ambulatory Visit | Attending: Cardiovascular Disease | Admitting: Cardiovascular Disease

## 2017-03-18 DIAGNOSIS — H35373 Puckering of macula, bilateral: Secondary | ICD-10-CM | POA: Diagnosis not present

## 2017-03-18 DIAGNOSIS — E119 Type 2 diabetes mellitus without complications: Secondary | ICD-10-CM | POA: Diagnosis not present

## 2017-03-18 DIAGNOSIS — H26492 Other secondary cataract, left eye: Secondary | ICD-10-CM | POA: Diagnosis not present

## 2017-03-18 DIAGNOSIS — H524 Presbyopia: Secondary | ICD-10-CM | POA: Diagnosis not present

## 2017-03-18 DIAGNOSIS — H353132 Nonexudative age-related macular degeneration, bilateral, intermediate dry stage: Secondary | ICD-10-CM | POA: Diagnosis not present

## 2017-03-19 ENCOUNTER — Encounter (HOSPITAL_COMMUNITY): Payer: Self-pay

## 2017-03-21 ENCOUNTER — Encounter (HOSPITAL_COMMUNITY)
Admission: RE | Admit: 2017-03-21 | Discharge: 2017-03-21 | Disposition: A | Discharge status: Home / Self Care | Payer: Self-pay | Source: Ambulatory Visit | Attending: Cardiovascular Disease | Admitting: Cardiovascular Disease

## 2017-03-21 ENCOUNTER — Ambulatory Visit (INDEPENDENT_AMBULATORY_CARE_PROVIDER_SITE_OTHER): Payer: Medicare Other | Admitting: *Deleted

## 2017-03-21 DIAGNOSIS — I639 Cerebral infarction, unspecified: Secondary | ICD-10-CM

## 2017-03-23 ENCOUNTER — Other Ambulatory Visit (HOSPITAL_COMMUNITY): Payer: Self-pay | Admitting: Nurse Practitioner

## 2017-03-24 ENCOUNTER — Encounter (HOSPITAL_COMMUNITY)
Admission: RE | Admit: 2017-03-24 | Discharge: 2017-03-24 | Disposition: A | Discharge status: Home / Self Care | Payer: Self-pay | Source: Ambulatory Visit | Attending: Cardiovascular Disease | Admitting: Cardiovascular Disease

## 2017-03-24 NOTE — Progress Notes (Signed)
Carelink Summary Report / Loop Recorder 

## 2017-03-24 NOTE — Telephone Encounter (Signed)
Last saw Dr Burt Knack 12/06/16, last labs 11/27/16 Creat 0.9, age 84, weight 70.8kg, based on specified criteria pt is on appropriate dosage of Eliquis 5mg  BID.  Will refill rx.

## 2017-03-25 NOTE — Progress Notes (Signed)
I reviewed note and agree with plan.   Penni Bombard, MD 6/73/4193, 7:90 AM Certified in Neurology, Neurophysiology and Neuroimaging  [facility] [address] [place], [place] [address] [phone]

## 2017-03-26 ENCOUNTER — Encounter (HOSPITAL_COMMUNITY)
Admission: RE | Admit: 2017-03-26 | Discharge: 2017-03-26 | Disposition: A | Discharge status: Home / Self Care | Payer: Self-pay | Source: Ambulatory Visit | Attending: Cardiovascular Disease | Admitting: Cardiovascular Disease

## 2017-03-28 ENCOUNTER — Encounter (HOSPITAL_COMMUNITY)
Admission: RE | Admit: 2017-03-28 | Discharge: 2017-03-28 | Disposition: A | Discharge status: Home / Self Care | Payer: Self-pay | Source: Ambulatory Visit | Attending: Cardiovascular Disease | Admitting: Cardiovascular Disease

## 2017-03-31 ENCOUNTER — Encounter (HOSPITAL_COMMUNITY)
Admission: RE | Admit: 2017-03-31 | Discharge: 2017-03-31 | Disposition: A | Discharge status: Home / Self Care | Payer: Self-pay | Source: Ambulatory Visit | Attending: Cardiovascular Disease | Admitting: Cardiovascular Disease

## 2017-04-01 DIAGNOSIS — M8580 Other specified disorders of bone density and structure, unspecified site: Secondary | ICD-10-CM | POA: Diagnosis not present

## 2017-04-01 DIAGNOSIS — E042 Nontoxic multinodular goiter: Secondary | ICD-10-CM | POA: Diagnosis not present

## 2017-04-01 DIAGNOSIS — E119 Type 2 diabetes mellitus without complications: Secondary | ICD-10-CM | POA: Diagnosis not present

## 2017-04-01 DIAGNOSIS — I2510 Atherosclerotic heart disease of native coronary artery without angina pectoris: Secondary | ICD-10-CM | POA: Diagnosis not present

## 2017-04-01 DIAGNOSIS — Z5181 Encounter for therapeutic drug level monitoring: Secondary | ICD-10-CM | POA: Diagnosis not present

## 2017-04-02 ENCOUNTER — Encounter (HOSPITAL_COMMUNITY)
Admission: RE | Admit: 2017-04-02 | Discharge: 2017-04-02 | Disposition: A | Discharge status: Home / Self Care | Payer: Self-pay | Source: Ambulatory Visit | Attending: Cardiovascular Disease | Admitting: Cardiovascular Disease

## 2017-04-04 ENCOUNTER — Encounter (HOSPITAL_COMMUNITY): Payer: Self-pay

## 2017-04-07 ENCOUNTER — Encounter (HOSPITAL_COMMUNITY)
Admission: RE | Admit: 2017-04-07 | Discharge: 2017-04-07 | Disposition: A | Discharge status: Home / Self Care | Payer: Self-pay | Source: Ambulatory Visit | Attending: Cardiovascular Disease | Admitting: Cardiovascular Disease

## 2017-04-09 ENCOUNTER — Encounter (HOSPITAL_COMMUNITY)
Admission: RE | Admit: 2017-04-09 | Discharge: 2017-04-09 | Disposition: A | Discharge status: Home / Self Care | Payer: Self-pay | Source: Ambulatory Visit | Attending: Cardiovascular Disease | Admitting: Cardiovascular Disease

## 2017-04-11 ENCOUNTER — Encounter (HOSPITAL_COMMUNITY): Payer: Self-pay

## 2017-04-11 DIAGNOSIS — I2510 Atherosclerotic heart disease of native coronary artery without angina pectoris: Secondary | ICD-10-CM | POA: Insufficient documentation

## 2017-04-14 ENCOUNTER — Encounter (HOSPITAL_COMMUNITY)
Admission: RE | Admit: 2017-04-14 | Discharge: 2017-04-14 | Disposition: A | Discharge status: Home / Self Care | Payer: Self-pay | Source: Ambulatory Visit | Attending: Cardiovascular Disease | Admitting: Cardiovascular Disease

## 2017-04-15 LAB — CUP PACEART REMOTE DEVICE CHECK
Date Time Interrogation Session: 20190209031029
Implantable Pulse Generator Implant Date: 20180514

## 2017-04-16 ENCOUNTER — Encounter (HOSPITAL_COMMUNITY): Payer: Self-pay

## 2017-04-18 ENCOUNTER — Encounter (HOSPITAL_COMMUNITY)
Admission: RE | Admit: 2017-04-18 | Discharge: 2017-04-18 | Disposition: A | Discharge status: Home / Self Care | Payer: Self-pay | Source: Ambulatory Visit | Attending: Cardiovascular Disease | Admitting: Cardiovascular Disease

## 2017-04-21 ENCOUNTER — Encounter (HOSPITAL_COMMUNITY): Payer: Self-pay

## 2017-04-21 ENCOUNTER — Encounter: Payer: Self-pay | Admitting: Cardiology

## 2017-04-23 ENCOUNTER — Ambulatory Visit (INDEPENDENT_AMBULATORY_CARE_PROVIDER_SITE_OTHER): Payer: Medicare Other | Admitting: *Deleted

## 2017-04-23 ENCOUNTER — Encounter (HOSPITAL_COMMUNITY): Payer: Self-pay

## 2017-04-23 DIAGNOSIS — I639 Cerebral infarction, unspecified: Secondary | ICD-10-CM

## 2017-04-24 DIAGNOSIS — M170 Bilateral primary osteoarthritis of knee: Secondary | ICD-10-CM | POA: Diagnosis not present

## 2017-04-24 NOTE — Progress Notes (Signed)
Carelink Summary Report / Loop Recorder 

## 2017-04-25 ENCOUNTER — Encounter (HOSPITAL_COMMUNITY): Payer: Self-pay

## 2017-04-28 ENCOUNTER — Encounter (HOSPITAL_COMMUNITY)
Admission: RE | Admit: 2017-04-28 | Discharge: 2017-04-28 | Disposition: A | Discharge status: Home / Self Care | Payer: Self-pay | Source: Ambulatory Visit | Attending: Cardiovascular Disease | Admitting: Cardiovascular Disease

## 2017-04-30 ENCOUNTER — Encounter (HOSPITAL_COMMUNITY): Payer: Self-pay

## 2017-05-01 DIAGNOSIS — M1711 Unilateral primary osteoarthritis, right knee: Secondary | ICD-10-CM | POA: Diagnosis not present

## 2017-05-01 DIAGNOSIS — M170 Bilateral primary osteoarthritis of knee: Secondary | ICD-10-CM | POA: Diagnosis not present

## 2017-05-01 DIAGNOSIS — M1712 Unilateral primary osteoarthritis, left knee: Secondary | ICD-10-CM | POA: Diagnosis not present

## 2017-05-02 ENCOUNTER — Encounter (HOSPITAL_COMMUNITY): Payer: Self-pay

## 2017-05-05 ENCOUNTER — Encounter (HOSPITAL_COMMUNITY): Payer: Self-pay

## 2017-05-07 ENCOUNTER — Encounter (HOSPITAL_COMMUNITY): Admission: RE | Admit: 2017-05-07 | Payer: Self-pay | Source: Ambulatory Visit

## 2017-05-08 DIAGNOSIS — M170 Bilateral primary osteoarthritis of knee: Secondary | ICD-10-CM | POA: Diagnosis not present

## 2017-05-08 DIAGNOSIS — M1712 Unilateral primary osteoarthritis, left knee: Secondary | ICD-10-CM | POA: Diagnosis not present

## 2017-05-08 DIAGNOSIS — M1711 Unilateral primary osteoarthritis, right knee: Secondary | ICD-10-CM | POA: Diagnosis not present

## 2017-05-09 ENCOUNTER — Encounter (HOSPITAL_COMMUNITY): Payer: Self-pay

## 2017-05-12 ENCOUNTER — Encounter (HOSPITAL_COMMUNITY)
Admission: RE | Admit: 2017-05-12 | Discharge: 2017-05-12 | Disposition: A | Discharge status: Home / Self Care | Payer: Self-pay | Source: Ambulatory Visit | Attending: Cardiovascular Disease | Admitting: Cardiovascular Disease

## 2017-05-12 DIAGNOSIS — I2510 Atherosclerotic heart disease of native coronary artery without angina pectoris: Secondary | ICD-10-CM | POA: Insufficient documentation

## 2017-05-14 ENCOUNTER — Encounter (HOSPITAL_COMMUNITY)
Admission: RE | Admit: 2017-05-14 | Discharge: 2017-05-14 | Disposition: A | Discharge status: Home / Self Care | Payer: Medicare Other | Source: Ambulatory Visit | Attending: Cardiovascular Disease | Admitting: Cardiovascular Disease

## 2017-05-16 ENCOUNTER — Encounter (HOSPITAL_COMMUNITY): Payer: Self-pay

## 2017-05-19 ENCOUNTER — Encounter (HOSPITAL_COMMUNITY)
Admission: RE | Admit: 2017-05-19 | Discharge: 2017-05-19 | Disposition: A | Discharge status: Home / Self Care | Payer: Self-pay | Source: Ambulatory Visit | Attending: Cardiovascular Disease | Admitting: Cardiovascular Disease

## 2017-05-21 ENCOUNTER — Encounter (HOSPITAL_COMMUNITY)
Admission: RE | Admit: 2017-05-21 | Discharge: 2017-05-21 | Disposition: A | Discharge status: Home / Self Care | Payer: Self-pay | Source: Ambulatory Visit | Attending: Cardiovascular Disease | Admitting: Cardiovascular Disease

## 2017-05-23 ENCOUNTER — Encounter (HOSPITAL_COMMUNITY)
Admission: RE | Admit: 2017-05-23 | Discharge: 2017-05-23 | Disposition: A | Discharge status: Home / Self Care | Payer: Self-pay | Source: Ambulatory Visit | Attending: Cardiovascular Disease | Admitting: Cardiovascular Disease

## 2017-05-26 ENCOUNTER — Encounter (HOSPITAL_COMMUNITY): Admission: RE | Admit: 2017-05-26 | Payer: Self-pay | Source: Ambulatory Visit

## 2017-05-26 ENCOUNTER — Ambulatory Visit (INDEPENDENT_AMBULATORY_CARE_PROVIDER_SITE_OTHER): Payer: Medicare Other | Admitting: *Deleted

## 2017-05-26 DIAGNOSIS — I639 Cerebral infarction, unspecified: Secondary | ICD-10-CM

## 2017-05-27 NOTE — Progress Notes (Signed)
Carelink Summary Report / Loop Recorder 

## 2017-05-28 ENCOUNTER — Encounter (HOSPITAL_COMMUNITY): Payer: Self-pay

## 2017-05-30 ENCOUNTER — Encounter (HOSPITAL_COMMUNITY): Payer: Self-pay

## 2017-06-02 ENCOUNTER — Encounter (HOSPITAL_COMMUNITY): Payer: Self-pay

## 2017-06-02 LAB — CUP PACEART REMOTE DEVICE CHECK
Date Time Interrogation Session: 20190314033932
MDC IDC PG IMPLANT DT: 20180514

## 2017-06-04 ENCOUNTER — Encounter (HOSPITAL_COMMUNITY)
Admission: RE | Admit: 2017-06-04 | Discharge: 2017-06-04 | Disposition: A | Discharge status: Home / Self Care | Payer: Medicare Other | Source: Ambulatory Visit | Attending: Cardiovascular Disease | Admitting: Cardiovascular Disease

## 2017-06-06 ENCOUNTER — Encounter (HOSPITAL_COMMUNITY)
Admission: RE | Admit: 2017-06-06 | Discharge: 2017-06-06 | Disposition: A | Discharge status: Home / Self Care | Payer: Medicare Other | Source: Ambulatory Visit | Attending: Cardiovascular Disease | Admitting: Cardiovascular Disease

## 2017-06-09 ENCOUNTER — Encounter (HOSPITAL_COMMUNITY)
Admission: RE | Admit: 2017-06-09 | Discharge: 2017-06-09 | Disposition: A | Discharge status: Home / Self Care | Payer: Self-pay | Source: Ambulatory Visit | Attending: Cardiovascular Disease | Admitting: Cardiovascular Disease

## 2017-06-09 DIAGNOSIS — E782 Mixed hyperlipidemia: Secondary | ICD-10-CM | POA: Diagnosis not present

## 2017-06-09 DIAGNOSIS — E119 Type 2 diabetes mellitus without complications: Secondary | ICD-10-CM | POA: Diagnosis not present

## 2017-06-09 DIAGNOSIS — I10 Essential (primary) hypertension: Secondary | ICD-10-CM | POA: Diagnosis not present

## 2017-06-10 DIAGNOSIS — H906 Mixed conductive and sensorineural hearing loss, bilateral: Secondary | ICD-10-CM | POA: Diagnosis not present

## 2017-06-10 DIAGNOSIS — E1159 Type 2 diabetes mellitus with other circulatory complications: Secondary | ICD-10-CM | POA: Diagnosis not present

## 2017-06-10 DIAGNOSIS — I10 Essential (primary) hypertension: Secondary | ICD-10-CM | POA: Diagnosis not present

## 2017-06-10 DIAGNOSIS — E1151 Type 2 diabetes mellitus with diabetic peripheral angiopathy without gangrene: Secondary | ICD-10-CM | POA: Diagnosis not present

## 2017-06-10 DIAGNOSIS — F329 Major depressive disorder, single episode, unspecified: Secondary | ICD-10-CM | POA: Diagnosis not present

## 2017-06-10 DIAGNOSIS — E782 Mixed hyperlipidemia: Secondary | ICD-10-CM | POA: Diagnosis not present

## 2017-06-11 ENCOUNTER — Encounter (HOSPITAL_COMMUNITY)
Admission: RE | Admit: 2017-06-11 | Discharge: 2017-06-11 | Disposition: A | Discharge status: Home / Self Care | Payer: Self-pay | Source: Ambulatory Visit | Attending: Cardiovascular Disease | Admitting: Cardiovascular Disease

## 2017-06-11 DIAGNOSIS — I2510 Atherosclerotic heart disease of native coronary artery without angina pectoris: Secondary | ICD-10-CM | POA: Insufficient documentation

## 2017-06-13 ENCOUNTER — Encounter (HOSPITAL_COMMUNITY)
Admission: RE | Admit: 2017-06-13 | Discharge: 2017-06-13 | Disposition: A | Discharge status: Home / Self Care | Payer: Self-pay | Source: Ambulatory Visit | Attending: Cardiovascular Disease | Admitting: Cardiovascular Disease

## 2017-06-16 ENCOUNTER — Encounter (HOSPITAL_COMMUNITY)
Admission: RE | Admit: 2017-06-16 | Discharge: 2017-06-16 | Disposition: A | Discharge status: Home / Self Care | Payer: Self-pay | Source: Ambulatory Visit | Attending: Cardiovascular Disease | Admitting: Cardiovascular Disease

## 2017-06-18 ENCOUNTER — Encounter (HOSPITAL_COMMUNITY): Payer: Self-pay

## 2017-06-18 ENCOUNTER — Ambulatory Visit (INDEPENDENT_AMBULATORY_CARE_PROVIDER_SITE_OTHER): Payer: Medicare Other | Admitting: Cardiovascular Disease

## 2017-06-18 ENCOUNTER — Telehealth: Payer: Self-pay

## 2017-06-18 ENCOUNTER — Encounter: Payer: Self-pay | Admitting: Cardiovascular Disease

## 2017-06-18 VITALS — BP 94/50 | HR 65 | Ht 63.5 in | Wt 153.0 lb

## 2017-06-18 DIAGNOSIS — I2510 Atherosclerotic heart disease of native coronary artery without angina pectoris: Secondary | ICD-10-CM

## 2017-06-18 DIAGNOSIS — I480 Paroxysmal atrial fibrillation: Secondary | ICD-10-CM

## 2017-06-18 DIAGNOSIS — E785 Hyperlipidemia, unspecified: Secondary | ICD-10-CM

## 2017-06-18 DIAGNOSIS — I639 Cerebral infarction, unspecified: Secondary | ICD-10-CM

## 2017-06-18 MED ORDER — RAMIPRIL 2.5 MG PO CAPS: 2.5000 mg | ORAL_CAPSULE | Freq: Two times a day (BID) | ORAL | 3 refills | Status: DC

## 2017-06-18 MED ORDER — APIXABAN 5 MG PO TABS: 5.0000 mg | ORAL_TABLET | Freq: Two times a day (BID) | ORAL | 3 refills | Status: DC

## 2017-06-18 MED ORDER — NITROGLYCERIN 0.4 MG SL SUBL: 0.4000 mg | SUBLINGUAL_TABLET | SUBLINGUAL | 3 refills | Status: DC | PRN

## 2017-06-18 MED ORDER — ROSUVASTATIN CALCIUM 10 MG PO TABS: 10.0000 mg | ORAL_TABLET | Freq: Every day | ORAL | 3 refills | Status: DC

## 2017-06-18 MED ORDER — HYDROCHLOROTHIAZIDE 12.5 MG PO CAPS: 12.5000 mg | ORAL_CAPSULE | Freq: Every day | ORAL | 3 refills | Status: DC

## 2017-06-18 MED ORDER — METOPROLOL SUCCINATE ER 25 MG PO TB24: 25.0000 mg | ORAL_TABLET | Freq: Every day | ORAL | 3 refills | Status: DC

## 2017-06-18 NOTE — Patient Instructions (Signed)

## 2017-06-18 NOTE — Telephone Encounter (Signed)
Confirmed with patient she is taking Eliquis 5 mg BID (it was reported at [place] she was taking daily). Refills called in. Patient was grateful for assistance.

## 2017-06-18 NOTE — Progress Notes (Addendum)
Cardiology Office Note Date:  06/24/2017   ID:  Stacy [name], DOB [birth year], MRN [id number]  PCP:  Lillard Anes, MD  Cardiologist:  Sherren Mocha, MD    Chief Complaint  Patient presents with  . Follow-up    CAD     History of Present Illness: Stacy [name] is a 84 y.o. female who presents for follow-up evaluation.  The patient initially presented in 1997 with an anterior MI treated with balloon angioplasty of the LAD.  She later was treated with stenting of the right coronary artery in 2000.  Her most recent heart catheterization in 2008 demonstrated patency of her previous angioplasty and stent sites.  She is also been diagnosed with paroxysmal atrial fibrillation after presenting with a stroke in May 2018.  The patient is here with her daughter today.  She is been doing very well.  She continues to [facility].  She denies chest pain, chest pressure, or shortness of breath.  She denies lightheadedness, heart palpitations, orthopnea, or PND.  Past Medical History:  Diagnosis Date  . Anxiety   . Arthritis    Cervical DDD C5-6 and C6-7  . Atheroembolism   . Blue toe syndrome ([place])   . CAD (coronary artery disease)   . Cancer ([place])    skin BCC  Forehead and  SCC Left Lower Leg  . Carotid artery occlusion   . Chronic kidney disease, stage III (moderate) (HCC)   . DDD (degenerative disc disease)   . Depression   . DM (diabetes mellitus) ([place])   . Gall stones   . Gallstones   . GERD (gastroesophageal reflux disease)   . Heart murmur   . Heart murmur   . Hemorrhoid   . Hemorrhoids   . Hiatal hernia   . History of shingles   . Hyperlipidemia   . Hypertension   . Leg pain   . Mild incontinence   . Multinodular goiter   . Multiple thyroid nodules    checked every year  . Myocardial infarction ([place]) 03/15/95  . Osteopenia   . Peptic ulcer disease   . Stenosis of right carotid artery   . Stomach cancer ([place]) 1980  . Stroke  ([place])   . Thyroid nodule   . Vision abnormalities     Past Surgical History:  Procedure Laterality Date  . 3 epidural steroid injections    . APPENDECTOMY    . Basal Cell Cancer Removal     forehead  . CATARACT EXTRACTION    . CATARACT EXTRACTION  12/29/2003  . CORONARY ANGIOPLASTY  03-15-1995  . CORONARY ANGIOPLASTY WITH STENT PLACEMENT  05-19-1998  . CORONARY STENT PLACEMENT  2000  . DILATION AND CURETTAGE OF UTERUS    . ENDARTERECTOMY Right 04/13/2015   Procedure: ENDARTERECTOMY CAROTID;  Surgeon: Serafina Mitchell, MD;  Location: [facility] OR;  Service: Vascular;  Laterality: Right;  . ENDOMETRIAL BIOPSY    . EYE SURGERY    . heart catherization    . HEMORRHOID SURGERY    . INCISIONAL HERNIA REPAIR    . INGUINAL HERNIA REPAIR    . LOOP RECORDER INSERTION N/A 06/24/2016   Procedure: Loop Recorder Insertion;  Surgeon: Deboraha Sprang, MD;  Location: [facility];  Service: Cardiovascular;  Laterality: N/A;  . PATCH ANGIOPLASTY Right 04/13/2015   Procedure: PATCH ANGIOPLASTY;  Surgeon: Serafina Mitchell, MD;  Location: [facility] OR;  Service: Vascular;  Laterality: Right;  . PTCA    .  SQUAMOUS CELL CARCINOMA EXCISION     left lower leg  . TEE WITHOUT CARDIOVERSION N/A 06/24/2016   Procedure: TRANSESOPHAGEAL ECHOCARDIOGRAM (TEE);  Surgeon: Dorothy Spark, MD;  Location: [facility] ENDOSCOPY;  Service: Cardiovascular;  Laterality: N/A;  . TONSILLECTOMY      Current Outpatient Medications  Medication Sig Dispense Refill  . ACCU-CHEK SMARTVIEW test strip 1 each by Other route as needed for other (blood sugar test strips).   11  . acetaminophen (TYLENOL) 325 MG tablet Take 325 mg by mouth every 6 (six) hours as needed for mild pain or moderate pain.    Marland Kitchen apixaban (ELIQUIS) 5 MG TABS tablet Take 1 tablet (5 mg total) by mouth 2 (two) times daily. 180 tablet 3  . cholecalciferol (VITAMIN D) 400 UNITS TABS Take 400 Units by mouth daily.     . Coenzyme Q10 (COQ-10 PO) Take 50 mg by mouth daily. 1 tab po  qd    . conjugated estrogens (PREMARIN) vaginal cream Place 1 g vaginally once a week. .625 mg once weekly    . dexlansoprazole (DEXILANT) 60 MG capsule Take 60 mg by mouth daily as needed (for acid reflux). Take 1 tablet ( Dexilant DR ) by mouth once daily    . diphenhydrAMINE (BENADRYL) 25 mg capsule Take 25 mg by mouth as needed for allergies.     Marland Kitchen docusate sodium (COLACE) 100 MG capsule Take 100 mg by mouth daily as needed.     . gabapentin (NEURONTIN) 100 MG capsule Take 1 tablet by mouth 3 (three) times daily.    . hydrochlorothiazide (MICROZIDE) 12.5 MG capsule Take 1 capsule (12.5 mg total) by mouth daily. 90 capsule 3  . hydrocortisone 2.5 % cream Apply 1 application topically daily as needed (for itching).   1  . Melatonin 5 MG CAPS Take 1 capsule by mouth at bedtime as needed (sleep).     . menthol-zinc oxide (GOLD BOND) powder Apply 1 application topically 2 (two) times daily as needed (freshness.).    Marland Kitchen metFORMIN (GLUCOPHAGE) 500 MG tablet Take 500 mg by mouth 2 (two) times daily.    . metoprolol succinate (TOPROL-XL) 25 MG 24 hr tablet Take 1 tablet (25 mg total) by mouth daily. 90 tablet 3  . Multiple Vitamins-Minerals (ICAPS AREDS 2 PO) Take by mouth.    . NEOMYCIN-POLYMYXIN-HYDROCORTISONE (CORTISPORIN) 1 % SOLN otic solution Place 2 drops into both ears as directed.   3  . nitroGLYCERIN (NITROSTAT) 0.4 MG SL tablet Place 1 tablet (0.4 mg total) under the tongue every 5 (five) minutes as needed for chest pain. 25 tablet 3  . Polyethylene Glycol 400 (BLINK TEARS OP) Place 1 drop into both eyes daily as needed (for dry eye). Reported on 05/05/2015    . ramipril (ALTACE) 2.5 MG capsule Take 1 capsule (2.5 mg total) by mouth 2 (two) times daily. 180 capsule 3  . rosuvastatin (CRESTOR) 10 MG tablet Take 1 tablet (10 mg total) by mouth daily. 90 tablet 3  . sertraline (ZOLOFT) 25 MG tablet Take 25 mg by mouth daily.     Marland Kitchen trolamine salicylate (ASPERCREME) 10 % cream Apply 1 application  topically as needed for muscle pain.     No current facility-administered medications for this visit.     Allergies:   Iohexol; Sulfonamide derivatives; Latex; Tranilast; Ivp dye [iodinated diagnostic agents]; Metformin and related; and Tape   Social History:  The patient  reports that she quit smoking about 18 years ago. Her smoking  use included cigarettes. She has a 54.00 pack-year smoking history. She has never used smokeless tobacco. She reports that she does not drink alcohol or use drugs.   Family History:  The patient's family history includes Cancer in her father; Diabetes in her father; Hypertension in her paternal uncle; Parkinsonism in her mother; Prostate cancer in her father; Stroke in her paternal uncle.    ROS:  Please see the history of present illness.  Otherwise, review of systems is positive for back pain.  All other systems are reviewed and negative.    PHYSICAL EXAM: VS:  BP (!) 94/50   Pulse 65   Ht 5' 3.5" (1.613 m)   Wt 153 lb (69.4 kg)   SpO2 97%   BMI 26.68 kg/m  , BMI Body mass index is 26.68 kg/m. GEN: Well nourished, well developed, pleasant elderly woman in no acute distress  HEENT: normal  Neck: no JVD, no masses. No carotid bruits Cardiac: RRR without murmur or gallop       Respiratory:  clear to auscultation bilaterally, normal work of breathing GI: soft, nontender, nondistended, + BS MS: no deformity or atrophy  Ext: no pretibial edema Skin: warm and dry, no rash Neuro:  Strength and sensation are intact Psych: euthymic mood, full affect  EKG:  EKG is ordered today. The EKG demonstrates normal sinus rhythm heart rate 65 bpm, PACs, right bundle branch block, left anterior fascicular block, age-indeterminate inferior infarct, possible age-indeterminate anterolateral infarct, no significant change from previous tracings.  Recent Labs: 08/29/2016: BUN 13; Creatinine, Ser 0.96; Hemoglobin 14.9; Platelets 219; Potassium 3.9; Sodium 137   Lipid  Panel     Component Value Date/Time   CHOL 102 06/23/2016 0441   TRIG 116 06/23/2016 0441   HDL 42 06/23/2016 0441   CHOLHDL 2.4 06/23/2016 0441   VLDL 23 06/23/2016 0441   LDLCALC 37 06/23/2016 0441      Wt Readings from Last 3 Encounters:  06/18/17 153 lb (69.4 kg)  03/04/17 156 lb (70.8 kg)  01/31/17 155 lb 9.6 oz (70.6 kg)     Cardiac Studies Reviewed: TEE 06/24/2016: Study Conclusions  - Left ventricle: Systolic function was mildly reduced. The   estimated ejection fraction was in the range of 45% to 50%. - Aortic valve: No evidence of vegetation. - Mitral valve: There was moderate regurgitation. - Left atrium: The atrium was dilated. No evidence of thrombus in   the atrial cavity or appendage. No evidence of thrombus in the   atrial cavity or appendage. - Right atrium: No evidence of thrombus in the atrial cavity or   appendage. - Atrial septum: No defect or patent foramen ovale was identified. - Tricuspid valve: There was moderate regurgitation. - Pulmonary arteries: PA peak pressure: 39 mm Hg (S).  Impressions:  - There is akinesis in the apical anterior and septal walls, no   thrombus was seen with Definity echocontrast. LVEF 45-50%.   Moderate mitral and tricuspid regurgitation.   No PFO by color Doppler or bubble study.   Mild pulmonary hypertension, RVSP 39 mmHg.  Echo 06/23/2016: Study Conclusions  - Left ventricle: There is a large apical and distal anterior   akinetic area. No definate thrombus is seen but if embolus   suspected may repeat with Definity. Stacy [name] is also a   consideration. The cavity size was normal. Wall thickness was   increased in a pattern of mild LVH. Systolic function was mildly   to moderately reduced. The estimated ejection fraction  was in the   range of 40% to 45%. Akinesis of the mid-apicalanterior and   apical myocardium. - Mitral valve: There was moderate regurgitation directed   posteriorly and toward the free  wall. - Left atrium: The atrium was mildly dilated. - Tricuspid valve: There was moderate regurgitation. - Pulmonary arteries: Systolic pressure was mildly increased. PA   peak pressure: 40 mm Hg (S).  ASSESSMENT AND PLAN: 1.  Paroxysmal atrial fibrillation: Continue apixaban for anticoagulation.  The patient's atrial fibrillation burden is low.  She is asymptomatic and no changes are made to her regimen today.  2.  Coronary artery disease, native vessel, without angina: Patient is doing well on medical therapy which is reviewed today.  3.  Hyperlipidemia: Most recent lab work is reviewed.  This demonstrates a cholesterol of 122, triglycerides 143, HDL 47, and LDL 46.  The patient is treated with rosuvastatin.  Her lipids are at goal.  LFTs are within normal limits.  4.  Hypertension: Blood pressure has been well controlled.  Noted to be low today.  She is asymptomatic.  5.  Carotid stenosis with history of stroke: Patient with a history of carotid endarterectomy.  She is followed by vascular surgery.  Current medicines are reviewed with the patient today.  The patient does not have concerns regarding medicines.  Labs/ tests ordered today include:  No orders of the defined types were placed in this encounter.   Disposition:   FU 6 months  Signed, Sherren Mocha, MD  06/24/2017 [facility] [address], [place], [place]  [address] Phone: [phone]; Fax: [phone]

## 2017-06-20 ENCOUNTER — Encounter (HOSPITAL_COMMUNITY): Payer: Self-pay

## 2017-06-23 ENCOUNTER — Encounter (HOSPITAL_COMMUNITY): Payer: Self-pay

## 2017-06-24 ENCOUNTER — Encounter: Payer: Self-pay | Admitting: Cardiovascular Disease

## 2017-06-25 ENCOUNTER — Encounter: Payer: Medicare Other | Admitting: *Deleted

## 2017-06-25 ENCOUNTER — Encounter (HOSPITAL_COMMUNITY): Payer: Self-pay

## 2017-06-25 LAB — CUP PACEART REMOTE DEVICE CHECK
Date Time Interrogation Session: 20190416060913
MDC IDC PG IMPLANT DT: 20180514

## 2017-06-27 ENCOUNTER — Encounter (HOSPITAL_COMMUNITY): Payer: Self-pay

## 2017-06-30 ENCOUNTER — Ambulatory Visit (INDEPENDENT_AMBULATORY_CARE_PROVIDER_SITE_OTHER): Payer: Medicare Other | Admitting: *Deleted

## 2017-06-30 ENCOUNTER — Encounter (HOSPITAL_COMMUNITY): Payer: Self-pay

## 2017-06-30 DIAGNOSIS — I639 Cerebral infarction, unspecified: Secondary | ICD-10-CM | POA: Diagnosis not present

## 2017-07-01 NOTE — Progress Notes (Signed)
Carelink Summary Report / Loop Recorder 

## 2017-07-02 ENCOUNTER — Encounter (HOSPITAL_COMMUNITY): Payer: Self-pay

## 2017-07-04 ENCOUNTER — Encounter (HOSPITAL_COMMUNITY): Payer: Self-pay

## 2017-07-09 ENCOUNTER — Encounter (HOSPITAL_COMMUNITY)
Admission: RE | Admit: 2017-07-09 | Discharge: 2017-07-09 | Disposition: A | Discharge status: Home / Self Care | Payer: Medicare Other | Source: Ambulatory Visit | Attending: Cardiovascular Disease | Admitting: Cardiovascular Disease

## 2017-07-11 ENCOUNTER — Encounter (HOSPITAL_COMMUNITY): Payer: Self-pay

## 2017-07-14 ENCOUNTER — Encounter (HOSPITAL_COMMUNITY)
Admission: RE | Admit: 2017-07-14 | Discharge: 2017-07-14 | Disposition: A | Discharge status: Home / Self Care | Payer: Self-pay | Source: Ambulatory Visit | Attending: Cardiovascular Disease | Admitting: Cardiovascular Disease

## 2017-07-14 DIAGNOSIS — I2510 Atherosclerotic heart disease of native coronary artery without angina pectoris: Secondary | ICD-10-CM | POA: Insufficient documentation

## 2017-07-16 ENCOUNTER — Encounter (HOSPITAL_COMMUNITY)
Admission: RE | Admit: 2017-07-16 | Discharge: 2017-07-16 | Disposition: A | Discharge status: Home / Self Care | Payer: Self-pay | Source: Ambulatory Visit | Attending: Cardiovascular Disease | Admitting: Cardiovascular Disease

## 2017-07-18 ENCOUNTER — Encounter (HOSPITAL_COMMUNITY)
Admission: RE | Admit: 2017-07-18 | Discharge: 2017-07-18 | Disposition: A | Discharge status: Home / Self Care | Payer: Self-pay | Source: Ambulatory Visit | Attending: Cardiovascular Disease | Admitting: Cardiovascular Disease

## 2017-07-21 ENCOUNTER — Encounter (HOSPITAL_COMMUNITY)
Admission: RE | Admit: 2017-07-21 | Discharge: 2017-07-21 | Disposition: A | Discharge status: Home / Self Care | Payer: Self-pay | Source: Ambulatory Visit | Attending: Cardiovascular Disease | Admitting: Cardiovascular Disease

## 2017-07-22 ENCOUNTER — Encounter: Payer: Self-pay | Admitting: Internal Medicine

## 2017-07-22 ENCOUNTER — Ambulatory Visit (INDEPENDENT_AMBULATORY_CARE_PROVIDER_SITE_OTHER): Payer: Medicare Other | Admitting: Internal Medicine

## 2017-07-22 VITALS — BP 130/70 | HR 61 | Ht 63.0 in | Wt 151.0 lb

## 2017-07-22 DIAGNOSIS — I639 Cerebral infarction, unspecified: Secondary | ICD-10-CM

## 2017-07-22 DIAGNOSIS — I480 Paroxysmal atrial fibrillation: Secondary | ICD-10-CM | POA: Diagnosis not present

## 2017-07-22 LAB — CUP PACEART REMOTE DEVICE CHECK
MDC IDC PG IMPLANT DT: 20180514
MDC IDC SESS DTM: 20190519114004

## 2017-07-22 LAB — CUP PACEART INCLINIC DEVICE CHECK
Date Time Interrogation Session: 20190611175522
Implantable Pulse Generator Implant Date: 20180514

## 2017-07-22 NOTE — Patient Instructions (Signed)
Medication Instructions:  Your physician recommends that you continue on your current medications as directed. Please refer to the Current Medication list given to you today.  Labwork: None ordered.  Testing/Procedures: None ordered.  Follow-Up: Your physician recommends that you schedule a follow-up appointment as needed.   Any Other Special Instructions Will Be Listed Below (If Applicable).     If you need a refill on your cardiac medications before your next appointment, please call your pharmacy.   

## 2017-07-22 NOTE — Progress Notes (Signed)
Patient Care Team: Lillard Anes, MD as PCP - General (Family Medicine) Sherren Mocha, MD as Consulting Physician (Cardiology) Delrae Rend, MD as Consulting Physician (Endocrinology)   HPI  Stacy [name] is a 84 y.o. female Seen in follow-up for loop recorder implanted for cryptogenic stroke 7/18.  She was found to have atrial fibrillation; she was also noted to have nonsustained ventricular tachycardia.    She has had no palpitations.  She was started on anticoagulation and has had no bleeding issues  DATE TEST EF   5/18 Echo   40-45 % Anteroapical Akinesis--MR mod  5/18 TEE  45-50 % MR mod           Date Cr Hgb  7/18 0.96 14.9   4/19 0.93 13.3     Records and Results Reviewed   Past Medical History:  Diagnosis Date  . Anxiety   . Arthritis    Cervical DDD C5-6 and C6-7  . Atheroembolism   . Blue toe syndrome ([place])   . CAD (coronary artery disease)   . Cancer ([place])    skin BCC  Forehead and  SCC Left Lower Leg  . Carotid artery occlusion   . Chronic kidney disease, stage III (moderate) (HCC)   . DDD (degenerative disc disease)   . Depression   . DM (diabetes mellitus) ([place])   . Gall stones   . Gallstones   . GERD (gastroesophageal reflux disease)   . Heart murmur   . Heart murmur   . Hemorrhoid   . Hemorrhoids   . Hiatal hernia   . History of shingles   . Hyperlipidemia   . Hypertension   . Leg pain   . Mild incontinence   . Multinodular goiter   . Multiple thyroid nodules    checked every year  . Myocardial infarction ([place]) 03/15/95  . Osteopenia   . Peptic ulcer disease   . Stenosis of right carotid artery   . Stomach cancer ([place]) 1980  . Stroke ([place])   . Thyroid nodule   . Vision abnormalities     Past Surgical History:  Procedure Laterality Date  . 3 epidural steroid injections    . APPENDECTOMY    . Basal Cell Cancer Removal     forehead  . CATARACT EXTRACTION    . CATARACT EXTRACTION  12/29/2003  . CORONARY  ANGIOPLASTY  03-15-1995  . CORONARY ANGIOPLASTY WITH STENT PLACEMENT  05-19-1998  . CORONARY STENT PLACEMENT  2000  . DILATION AND CURETTAGE OF UTERUS    . ENDARTERECTOMY Right 04/13/2015   Procedure: ENDARTERECTOMY CAROTID;  Surgeon: Serafina Mitchell, MD;  Location: [facility] OR;  Service: Vascular;  Laterality: Right;  . ENDOMETRIAL BIOPSY    . EYE SURGERY    . heart catherization    . HEMORRHOID SURGERY    . INCISIONAL HERNIA REPAIR    . INGUINAL HERNIA REPAIR    . LOOP RECORDER INSERTION N/A 06/24/2016   Procedure: Loop Recorder Insertion;  Surgeon: Deboraha Sprang, MD;  Location: [facility];  Service: Cardiovascular;  Laterality: N/A;  . PATCH ANGIOPLASTY Right 04/13/2015   Procedure: PATCH ANGIOPLASTY;  Surgeon: Serafina Mitchell, MD;  Location: [facility] OR;  Service: Vascular;  Laterality: Right;  . PTCA    . SQUAMOUS CELL CARCINOMA EXCISION     left lower leg  . TEE WITHOUT CARDIOVERSION N/A 06/24/2016   Procedure: TRANSESOPHAGEAL ECHOCARDIOGRAM (TEE);  Surgeon: Dorothy Spark, MD;  Location: [facility]  ENDOSCOPY;  Service: Cardiovascular;  Laterality: N/A;  . TONSILLECTOMY      Current Meds  Medication Sig  . ACCU-CHEK SMARTVIEW test strip 1 each by Other route as needed for other (blood sugar test strips).   Marland Kitchen acetaminophen (TYLENOL) 325 MG tablet Take 325 mg by mouth every 6 (six) hours as needed for mild pain or moderate pain.  Marland Kitchen apixaban (ELIQUIS) 5 MG TABS tablet Take 1 tablet (5 mg total) by mouth 2 (two) times daily.  . cholecalciferol (VITAMIN D) 400 UNITS TABS Take 400 Units by mouth daily.   . Coenzyme Q10 (COQ-10 PO) Take 50 mg by mouth daily. 1 tab po qd  . conjugated estrogens (PREMARIN) vaginal cream Place 1 g vaginally once a week. .625 mg once weekly  . dexlansoprazole (DEXILANT) 60 MG capsule Take 60 mg by mouth daily as needed (for acid reflux). Take 1 tablet ( Dexilant DR ) by mouth once daily  . diphenhydrAMINE (BENADRYL) 25 mg capsule Take 25 mg by mouth as needed for  allergies.   Marland Kitchen docusate sodium (COLACE) 100 MG capsule Take 100 mg by mouth daily as needed.   . gabapentin (NEURONTIN) 100 MG capsule Take 1 tablet by mouth 3 (three) times daily.  . hydrochlorothiazide (MICROZIDE) 12.5 MG capsule Take 1 capsule (12.5 mg total) by mouth daily.  . hydrocortisone 2.5 % cream Apply 1 application topically daily as needed (for itching).   . Melatonin 5 MG CAPS Take 1 capsule by mouth at bedtime as needed (sleep).   . menthol-zinc oxide (GOLD BOND) powder Apply 1 application topically 2 (two) times daily as needed (freshness.).  Marland Kitchen metFORMIN (GLUCOPHAGE) 500 MG tablet Take 500 mg by mouth 2 (two) times daily.  . metoprolol succinate (TOPROL-XL) 25 MG 24 hr tablet Take 1 tablet (25 mg total) by mouth daily.  . Multiple Vitamins-Minerals (ICAPS AREDS 2 PO) Take by mouth.  . NEOMYCIN-POLYMYXIN-HYDROCORTISONE (CORTISPORIN) 1 % SOLN otic solution Place 2 drops into both ears as directed.   . nitroGLYCERIN (NITROSTAT) 0.4 MG SL tablet Place 1 tablet (0.4 mg total) under the tongue every 5 (five) minutes as needed for chest pain.  . Polyethylene Glycol 400 (BLINK TEARS OP) Place 1 drop into both eyes daily as needed (for dry eye). Reported on 05/05/2015  . ramipril (ALTACE) 2.5 MG capsule Take 1 capsule (2.5 mg total) by mouth 2 (two) times daily.  . rosuvastatin (CRESTOR) 10 MG tablet Take 1 tablet (10 mg total) by mouth daily.  . sertraline (ZOLOFT) 25 MG tablet Take 25 mg by mouth daily.   Marland Kitchen trolamine salicylate (ASPERCREME) 10 % cream Apply 1 application topically as needed for muscle pain.    Allergies  Allergen Reactions  . Iohexol Other (See Comments)     Desc: HIVES- 13 HR PRE-MEDS ARE REQUIRED-ASM- 03/21/05,SULFA,ADHESIVE TAPE   . Sulfonamide Derivatives Hives    REACTION: hives  . Latex Other (See Comments)    Adhesive tape and EKG adhesive leads to rash  . Tranilast Hives    Investigational medication  . Ivp Dye [Iodinated Diagnostic Agents]   .  Metformin And Related Diarrhea    denies  . Tape     Adhesive tap      Review of Systems negative except from HPI and PMH  Physical Exam BP 130/70   Pulse 61   Ht _0  (1.6 m)   Wt 151 lb (68.5 kg)   SpO2 97%   BMI 26.75 kg/m  Well developed and  well nourished in no acute distress HENT normal E scleral and icterus clear Neck Supple JVP flat; carotids brisk and full Clear to ausculation Regular rate and rhythm, no murmurs gallops or rub Soft with active bowel sounds No clubbing cyanosis At the office still edema Alert and oriented, grossly normal motor and sensory function Skin Warm and Dry  ECG demonstrates sinus rhythm at 60 Interval 16/12/43 Axis left -67 Q waves 2 3 and F Anterior Q waves V2-V6 unchanged from 6/18  Assessment and  Plan  Cryptogenic stroke  Atrial fibrillation  Supraventricular tachycardia  Anticoagulation  Abnormal ECG   Interval tachycardia.  Interval atrial fibrillation.  She is unaware of it.  No bleeding on apixaban; labs were reviewed from outside office and they are normal.  Blood pressures well controlled.  Abnormal ECG does not have a clear explanation but is unchanged  We spent more than 50% of our >25 min visit in face to face counseling regarding the above       Current medicines are reviewed at length with the patient today .  The patient does not  have concerns regarding medicines.

## 2017-07-23 ENCOUNTER — Encounter (HOSPITAL_COMMUNITY)
Admission: RE | Admit: 2017-07-23 | Discharge: 2017-07-23 | Disposition: A | Discharge status: Home / Self Care | Payer: Self-pay | Source: Ambulatory Visit | Attending: Cardiovascular Disease | Admitting: Cardiovascular Disease

## 2017-07-23 ENCOUNTER — Other Ambulatory Visit: Payer: Self-pay

## 2017-07-23 DIAGNOSIS — I2510 Atherosclerotic heart disease of native coronary artery without angina pectoris: Secondary | ICD-10-CM

## 2017-07-23 MED ORDER — METOPROLOL SUCCINATE ER 25 MG PO TB24: 25.0000 mg | ORAL_TABLET | Freq: Every day | ORAL | 3 refills | Status: DC

## 2017-07-24 NOTE — Addendum Note (Signed)
Addended by: Campbell Riches on: 07/24/2017 08:24 AM   Modules accepted: Orders

## 2017-07-25 ENCOUNTER — Encounter (HOSPITAL_COMMUNITY)
Admission: RE | Admit: 2017-07-25 | Discharge: 2017-07-25 | Disposition: A | Discharge status: Home / Self Care | Payer: Self-pay | Source: Ambulatory Visit | Attending: Cardiovascular Disease | Admitting: Cardiovascular Disease

## 2017-07-28 ENCOUNTER — Encounter (HOSPITAL_COMMUNITY)
Admission: RE | Admit: 2017-07-28 | Discharge: 2017-07-28 | Disposition: A | Discharge status: Home / Self Care | Payer: Self-pay | Source: Ambulatory Visit | Attending: Cardiovascular Disease | Admitting: Cardiovascular Disease

## 2017-07-30 ENCOUNTER — Encounter (HOSPITAL_COMMUNITY)
Admission: RE | Admit: 2017-07-30 | Discharge: 2017-07-30 | Disposition: A | Discharge status: Home / Self Care | Payer: Self-pay | Source: Ambulatory Visit | Attending: Cardiovascular Disease | Admitting: Cardiovascular Disease

## 2017-08-01 ENCOUNTER — Encounter (HOSPITAL_COMMUNITY)
Admission: RE | Admit: 2017-08-01 | Discharge: 2017-08-01 | Disposition: A | Discharge status: Home / Self Care | Payer: Self-pay | Source: Ambulatory Visit | Attending: Cardiovascular Disease | Admitting: Cardiovascular Disease

## 2017-08-01 ENCOUNTER — Ambulatory Visit (INDEPENDENT_AMBULATORY_CARE_PROVIDER_SITE_OTHER): Payer: Medicare Other | Admitting: *Deleted

## 2017-08-01 DIAGNOSIS — I639 Cerebral infarction, unspecified: Secondary | ICD-10-CM | POA: Diagnosis not present

## 2017-08-04 ENCOUNTER — Ambulatory Visit: Payer: Medicare Other | Admitting: *Deleted

## 2017-08-04 ENCOUNTER — Encounter (HOSPITAL_COMMUNITY)
Admission: RE | Admit: 2017-08-04 | Discharge: 2017-08-04 | Disposition: A | Discharge status: Home / Self Care | Payer: Medicare Other | Source: Ambulatory Visit | Attending: Cardiovascular Disease | Admitting: Cardiovascular Disease

## 2017-08-04 DIAGNOSIS — I639 Cerebral infarction, unspecified: Secondary | ICD-10-CM | POA: Diagnosis not present

## 2017-08-04 NOTE — Progress Notes (Signed)
Carelink Summary Report / Loop Recorder 

## 2017-08-06 ENCOUNTER — Encounter (HOSPITAL_COMMUNITY)
Admission: RE | Admit: 2017-08-06 | Discharge: 2017-08-06 | Disposition: A | Discharge status: Home / Self Care | Payer: Medicare Other | Source: Ambulatory Visit | Attending: Cardiovascular Disease | Admitting: Cardiovascular Disease

## 2017-08-08 ENCOUNTER — Encounter (HOSPITAL_COMMUNITY)
Admission: RE | Admit: 2017-08-08 | Discharge: 2017-08-08 | Disposition: A | Discharge status: Home / Self Care | Payer: Medicare Other | Source: Ambulatory Visit | Attending: Cardiovascular Disease | Admitting: Cardiovascular Disease

## 2017-08-11 ENCOUNTER — Encounter (HOSPITAL_COMMUNITY)
Admission: RE | Admit: 2017-08-11 | Discharge: 2017-08-11 | Disposition: A | Discharge status: Home / Self Care | Payer: Self-pay | Source: Ambulatory Visit | Attending: Cardiovascular Disease | Admitting: Cardiovascular Disease

## 2017-08-11 DIAGNOSIS — I2510 Atherosclerotic heart disease of native coronary artery without angina pectoris: Secondary | ICD-10-CM | POA: Insufficient documentation

## 2017-08-12 ENCOUNTER — Ambulatory Visit (HOSPITAL_COMMUNITY): Payer: Self-pay | Admitting: Dentistry

## 2017-08-12 ENCOUNTER — Encounter (HOSPITAL_COMMUNITY): Payer: Self-pay | Admitting: Dentistry

## 2017-08-12 VITALS — BP 128/61 | HR 58 | Temp 97.8°F

## 2017-08-12 DIAGNOSIS — K0850 Unsatisfactory restoration of tooth, unspecified: Secondary | ICD-10-CM

## 2017-08-12 DIAGNOSIS — K0530 Chronic periodontitis, unspecified: Secondary | ICD-10-CM

## 2017-08-12 DIAGNOSIS — K036 Deposits [accretions] on teeth: Secondary | ICD-10-CM

## 2017-08-12 DIAGNOSIS — K08409 Partial loss of teeth, unspecified cause, unspecified class: Secondary | ICD-10-CM

## 2017-08-12 DIAGNOSIS — S025XXA Fracture of tooth (traumatic), initial encounter for closed fracture: Secondary | ICD-10-CM

## 2017-08-12 DIAGNOSIS — K031 Abrasion of teeth: Secondary | ICD-10-CM

## 2017-08-12 DIAGNOSIS — K06010 Localized gingival recession, unspecified: Secondary | ICD-10-CM

## 2017-08-12 NOTE — Patient Instructions (Signed)
Patient is to follow-up with Dr. Dorann Lodge for dental evaluation on 08/27/2017 for a buildup and crown on tooth number 4. Patient is to follow-up with Dental Medicine or Dr. Johnn Hai for reevaluation of the left submandibular lymphadenopathy that was palpable today and measured approximately 8 mm round. This was nontender to palpation. Patient is to call if acute pulpitis symptoms arise.  Dr. Enrique Sack

## 2017-08-12 NOTE — Progress Notes (Signed)
08/12/2017  Patient Name:   Stacy [name] Date of Birth:   January 27, 1927 Medical Record Number: [id number]  BP 128/61 (BP Location: Right Arm)   Pulse (!) 58   Temp 97.8 F (36.6 C)   Chief Complaint: Stacy [name] is an 84 year old female that presents for limited oral examination and evaluation of fractured upper right quadrant premolar.  HPI: The patient has a history of fracturing part of the tooth off of her upper right quadrant premolar several days ago.  Patient points tooth #4 as the fractured tooth. Patient denies any acute pulpitis symptoms. Patient denies any sensitivity to cold or hot.  The patient was last seen by Dental Medicine on 04/11/2015 for a resin restoration placed prior to the carotid endarterectomy surgery with Dr. Trula Slade. The patient had broken appointments due to health reasons on 03/24/2014 and 03/04/2016. The patient denies having any dental phobia.   Medical Hx Update:  Past Medical History:  Diagnosis Date  . Anxiety   . Arthritis    Cervical DDD C5-6 and C6-7  . Atheroembolism   . Blue toe syndrome ([place])   . CAD (coronary artery disease)   . Cancer ([place])    skin BCC  Forehead and  SCC Left Lower Leg  . Carotid artery occlusion   . Chronic kidney disease, stage III (moderate) (HCC)   . DDD (degenerative disc disease)   . Depression   . DM (diabetes mellitus) ([place])   . Gall stones   . Gallstones   . GERD (gastroesophageal reflux disease)   . Heart murmur   . Heart murmur   . Hemorrhoid   . Hemorrhoids   . Hiatal hernia   . History of shingles   . Hyperlipidemia   . Hypertension   . Leg pain   . Mild incontinence   . Multinodular goiter   . Multiple thyroid nodules    checked every year  . Myocardial infarction ([place]) 03/15/95  . Osteopenia   . Peptic ulcer disease   . Stenosis of right carotid artery   . Stomach cancer ([place]) 1980  . Stroke ([place])   . Thyroid nodule   . Vision abnormalities   .  Past Surgical History:  Procedure Laterality  Date  . 3 epidural steroid injections    . APPENDECTOMY    . Basal Cell Cancer Removal     forehead  . CATARACT EXTRACTION    . CATARACT EXTRACTION  12/29/2003  . CORONARY ANGIOPLASTY  03-15-1995  . CORONARY ANGIOPLASTY WITH STENT PLACEMENT  05-19-1998  . CORONARY STENT PLACEMENT  2000  . DILATION AND CURETTAGE OF UTERUS    . ENDARTERECTOMY Right 04/13/2015   Procedure: ENDARTERECTOMY CAROTID;  Surgeon: Serafina Mitchell, MD;  Location: [facility] OR;  Service: Vascular;  Laterality: Right;  . ENDOMETRIAL BIOPSY    . EYE SURGERY    . heart catherization    . HEMORRHOID SURGERY    . INCISIONAL HERNIA REPAIR    . INGUINAL HERNIA REPAIR    . LOOP RECORDER INSERTION N/A 06/24/2016   Procedure: Loop Recorder Insertion;  Surgeon: Deboraha Sprang, MD;  Location: [facility];  Service: Cardiovascular;  Laterality: N/A;  . PATCH ANGIOPLASTY Right 04/13/2015   Procedure: PATCH ANGIOPLASTY;  Surgeon: Serafina Mitchell, MD;  Location: [facility] OR;  Service: Vascular;  Laterality: Right;  . PTCA    . SQUAMOUS CELL CARCINOMA EXCISION     left lower leg  . TEE WITHOUT CARDIOVERSION N/A  06/24/2016   Procedure: TRANSESOPHAGEAL ECHOCARDIOGRAM (TEE);  Surgeon: Dorothy Spark, MD;  Location: [facility] ENDOSCOPY;  Service: Cardiovascular;  Laterality: N/A;  . TONSILLECTOMY      ALLERGIES/ADVERSE DRUG REACTIONS: Allergies  Allergen Reactions  . Iohexol Other (See Comments)     Desc: HIVES- 13 HR PRE-MEDS ARE REQUIRED-ASM- 03/21/05,SULFA,ADHESIVE TAPE   . Sulfonamide Derivatives Hives    REACTION: hives  . Latex Other (See Comments)    Adhesive tape and EKG adhesive leads to rash  . Tranilast Hives    Investigational medication  . Ivp Dye [Iodinated Diagnostic Agents]   . Metformin And Related Diarrhea    denies  . Tape     Adhesive tap    MEDICATIONS: Current Outpatient Medications  Medication Sig Dispense Refill  . ACCU-CHEK SMARTVIEW test strip 1 each by Other route as needed for other (blood sugar test  strips).   11  . acetaminophen (TYLENOL) 325 MG tablet Take 325 mg by mouth every 6 (six) hours as needed for mild pain or moderate pain.    Marland Kitchen apixaban (ELIQUIS) 5 MG TABS tablet Take 1 tablet (5 mg total) by mouth 2 (two) times daily. 180 tablet 3  . cholecalciferol (VITAMIN D) 400 UNITS TABS Take 400 Units by mouth daily.     . Coenzyme Q10 (COQ-10 PO) Take 50 mg by mouth daily. 1 tab po qd    . conjugated estrogens (PREMARIN) vaginal cream Place 1 g vaginally once a week. .625 mg once weekly    . dexlansoprazole (DEXILANT) 60 MG capsule Take 60 mg by mouth daily as needed (for acid reflux). Take 1 tablet ( Dexilant DR ) by mouth once daily    . diphenhydrAMINE (BENADRYL) 25 mg capsule Take 25 mg by mouth as needed for allergies.     . hydrochlorothiazide (MICROZIDE) 12.5 MG capsule Take 1 capsule (12.5 mg total) by mouth daily. 90 capsule 3  . Melatonin 5 MG CAPS Take 1 capsule by mouth at bedtime as needed (sleep).     . metFORMIN (GLUCOPHAGE) 500 MG tablet Take 500 mg by mouth 2 (two) times daily.    . metoprolol succinate (TOPROL-XL) 25 MG 24 hr tablet Take 1 tablet (25 mg total) by mouth daily. 90 tablet 3  . Multiple Vitamins-Minerals (ICAPS AREDS 2 PO) Take by mouth.    . NEOMYCIN-POLYMYXIN-HYDROCORTISONE (CORTISPORIN) 1 % SOLN otic solution Place 2 drops into both ears as directed.   3  . Polyethylene Glycol 400 (BLINK TEARS OP) Place 1 drop into both eyes daily as needed (for dry eye). Reported on 05/05/2015    . ramipril (ALTACE) 2.5 MG capsule Take 1 capsule (2.5 mg total) by mouth 2 (two) times daily. 180 capsule 3  . rosuvastatin (CRESTOR) 10 MG tablet Take 1 tablet (10 mg total) by mouth daily. 90 tablet 3  . sertraline (ZOLOFT) 25 MG tablet Take 25 mg by mouth daily.     Marland Kitchen docusate sodium (COLACE) 100 MG capsule Take 100 mg by mouth daily as needed.     . gabapentin (NEURONTIN) 100 MG capsule Take 1 tablet by mouth 3 (three) times daily.    . hydrocortisone 2.5 % cream Apply 1  application topically daily as needed (for itching).   1  . nitroGLYCERIN (NITROSTAT) 0.4 MG SL tablet Place 1 tablet (0.4 mg total) under the tongue every 5 (five) minutes as needed for chest pain. (Patient not taking: Reported on 08/12/2017) 25 tablet 3  . trolamine salicylate (ASPERCREME) 10 %  cream Apply 1 application topically as needed for muscle pain.     No current facility-administered medications for this visit.      Chief Complaint: Stacy [name] is an 84 year old female that presents for limited oral examination and evaluation of fractured upper right quadrant premolar.  HPI: The patient has a history of fracturing part of the tooth off of her upper right quadrant premolar several days ago.  Patient points tooth #4 as the fractured tooth. Patient denies any acute pulpitis symptoms. Patient denies any sensitivity to cold or hot.  The patient was last seen by Dental Medicine on 04/11/2015 for a resin restoration placed prior to the carotid endarterectomy surgery with Dr. Trula Slade. The patient had broken appointments due to health reasons on 03/24/2014 and 03/04/2016. The patient denies having any dental phobia.   DENTAL EXAM: General:   The patient is a well-developed, well-nourished female in no acute distress. Vitals: BP 128/61 (BP Location: Right Arm)   Pulse (!) 58   Temp 97.8 F (36.6 C)  Extraoral Exam: The patient has left neck lymphadenopathy that is measured at approximately 8 mm round. This is non-tender to palpation.  This will need to be reevaluated in 2-3 weeks to determine if additional referral as needed.  The patient denies having acute TMJ Symptoms. Intraoral  Exam: The patient has normal saliva.  There is no evidence of oral abscess formation. Dentition: patient is missing tooth numbers 1, 16, 17, 19, 31, and 32. Tooth #19 has been replaced with the bridge from 18-20-21. Periodontal:  The patient has chronic periodontitis with plaque and calculus accumulations, gingival  recession, and incipient mandibular anterior tooth mobility. Periodontal charting was not performed today. Caries/suboptimal dental restorations: The patient has a fractured tooth #4. This will need a core buildup and crown restoration. The patient is being referred to Dr. Dorann Lodge for these dental restorations. Dr. Johnn Hai may also evaluate the patient for resin restorations on tooth #6, 7, and a less than aesthetic PFM crown on tooth #8. Endodontic:  The patient currently denies acute pulpitis symptoms. I do not see any evidence of pedicle pathology at this time. Crown and Bridge: The patient has multiple crown or bridge restorations. Some of them are less than ideal due to poor aesthetics.  Patient also has multiple large and extensive restorations that ideally could benefit from further crown or bridge therapy. Prosthodontic: There are no partial dentures. Occlusion:  Patient has a stable occlusion. Radiographic Interpretation:  A full series of radiographs was taken on 08/12/2017. There are multiple missing teeth. There is incipient to moderate bone loss. Multiple amalgam, resin, crown or bridge restorations are noted. There is a fracture tooth #4. There may be recurrent caries on the mesial of #6 and the mesial and distal of #7.  Assessments: 1. Multiple medical comorbidities- currently stable. 2. Fractured tooth #4  3. Multiple suboptimal dental restorations 4. Chronic periodontitis with bone loss 5. Gingival recession 6. Accretions-minimal. 7. Incipient mandibular anterior tooth mobility 8. Poor occlusal scheme but a stable occlusion. 9. Multiple flexure lesions 10. Risk for bleeding with invasive dental procedures due to current Eliquis therapy 11.  Left submandibular lymphadenopathy currently measuring approximately 20m round.  Plan:  1.  I discussed the risks, benefits, competitions various treatment options with the patient and her daughter in relationship to her medical and  dental conditions.  Due to the nature of her dental restorative needs, I am referring the patient to Dr. MDorann Lodgefor her  continued comprehensive dental care. This will include evaluation of tooth #4 for a core buildup and crown restoration as well as additional restorations involving tooth numbers 6, 7, 8 and other teeth that ideally could benefit from crown and bridge therapy.  In the meantime, I have placed a Vitrabond resin liner over the exposed dentin of tooth #4. Patient is to contact Dental Medicine if acute symptoms arise. The patient is also in need of periodontal maintenance procedures at this time. The patient also had palpable left neck lymphadenopathy involving the left submandibular node. This may be reevaluated by Dr. Johnn Hai at the appointment currently scheduled for 08/27/2017. Alternatively, the patient may return to dental medicine for reevaluation of the left subtalar noted with further referral as needed.  2. Discussion of findings with medical and dental providers to assist in coordination of referral to Dr. Dorann Lodge at this time. Release of information has been obtained to forward records Dr. Dorann Lodge.   Lenn Cal, DDS

## 2017-08-13 ENCOUNTER — Encounter (HOSPITAL_COMMUNITY)
Admission: RE | Admit: 2017-08-13 | Discharge: 2017-08-13 | Disposition: A | Discharge status: Home / Self Care | Payer: Self-pay | Source: Ambulatory Visit | Attending: Cardiovascular Disease | Admitting: Cardiovascular Disease

## 2017-08-15 ENCOUNTER — Encounter (HOSPITAL_COMMUNITY)
Admission: RE | Admit: 2017-08-15 | Discharge: 2017-08-15 | Disposition: A | Discharge status: Home / Self Care | Payer: Self-pay | Source: Ambulatory Visit | Attending: Cardiovascular Disease | Admitting: Cardiovascular Disease

## 2017-08-18 ENCOUNTER — Encounter (HOSPITAL_COMMUNITY): Payer: Self-pay

## 2017-08-20 ENCOUNTER — Encounter (HOSPITAL_COMMUNITY)
Admission: RE | Admit: 2017-08-20 | Discharge: 2017-08-20 | Disposition: A | Discharge status: Home / Self Care | Payer: Self-pay | Source: Ambulatory Visit | Attending: Cardiovascular Disease | Admitting: Cardiovascular Disease

## 2017-08-22 ENCOUNTER — Encounter (HOSPITAL_COMMUNITY)
Admission: RE | Admit: 2017-08-22 | Discharge: 2017-08-22 | Disposition: A | Discharge status: Home / Self Care | Payer: Self-pay | Source: Ambulatory Visit | Attending: Cardiovascular Disease | Admitting: Cardiovascular Disease

## 2017-08-25 ENCOUNTER — Encounter (HOSPITAL_COMMUNITY)
Admission: RE | Admit: 2017-08-25 | Discharge: 2017-08-25 | Disposition: A | Discharge status: Home / Self Care | Payer: Medicare Other | Source: Ambulatory Visit | Attending: Cardiovascular Disease | Admitting: Cardiovascular Disease

## 2017-08-27 ENCOUNTER — Encounter (HOSPITAL_COMMUNITY): Payer: Self-pay

## 2017-08-29 ENCOUNTER — Encounter (HOSPITAL_COMMUNITY): Admission: RE | Admit: 2017-08-29 | Payer: Self-pay | Source: Ambulatory Visit

## 2017-09-01 ENCOUNTER — Encounter (HOSPITAL_COMMUNITY)
Admission: RE | Admit: 2017-09-01 | Discharge: 2017-09-01 | Disposition: A | Discharge status: Home / Self Care | Payer: Medicare Other | Source: Ambulatory Visit | Attending: Cardiovascular Disease | Admitting: Cardiovascular Disease

## 2017-09-03 ENCOUNTER — Encounter (HOSPITAL_COMMUNITY)
Admission: RE | Admit: 2017-09-03 | Discharge: 2017-09-03 | Disposition: A | Discharge status: Home / Self Care | Payer: Self-pay | Source: Ambulatory Visit | Attending: Cardiovascular Disease | Admitting: Cardiovascular Disease

## 2017-09-03 ENCOUNTER — Ambulatory Visit (INDEPENDENT_AMBULATORY_CARE_PROVIDER_SITE_OTHER): Payer: Medicare Other | Admitting: *Deleted

## 2017-09-03 DIAGNOSIS — I639 Cerebral infarction, unspecified: Secondary | ICD-10-CM

## 2017-09-03 NOTE — Progress Notes (Signed)
Carelink Summary Report / Loop Recorder 

## 2017-09-04 LAB — CUP PACEART REMOTE DEVICE CHECK
Date Time Interrogation Session: 20190621113759
Implantable Pulse Generator Implant Date: 20180514

## 2017-09-05 ENCOUNTER — Encounter (HOSPITAL_COMMUNITY)
Admission: RE | Admit: 2017-09-05 | Discharge: 2017-09-05 | Disposition: A | Discharge status: Home / Self Care | Payer: Self-pay | Source: Ambulatory Visit | Attending: Cardiovascular Disease | Admitting: Cardiovascular Disease

## 2017-09-08 ENCOUNTER — Encounter (HOSPITAL_COMMUNITY)
Admission: RE | Admit: 2017-09-08 | Discharge: 2017-09-08 | Disposition: A | Discharge status: Home / Self Care | Payer: Self-pay | Source: Ambulatory Visit | Attending: Cardiovascular Disease | Admitting: Cardiovascular Disease

## 2017-09-10 ENCOUNTER — Encounter (HOSPITAL_COMMUNITY)
Admission: RE | Admit: 2017-09-10 | Discharge: 2017-09-10 | Disposition: A | Discharge status: Home / Self Care | Payer: Self-pay | Source: Ambulatory Visit | Attending: Cardiovascular Disease | Admitting: Cardiovascular Disease

## 2017-09-12 ENCOUNTER — Encounter (HOSPITAL_COMMUNITY)
Admission: RE | Admit: 2017-09-12 | Discharge: 2017-09-12 | Disposition: A | Discharge status: Home / Self Care | Payer: Self-pay | Source: Ambulatory Visit | Attending: Cardiovascular Disease | Admitting: Cardiovascular Disease

## 2017-09-12 DIAGNOSIS — I2510 Atherosclerotic heart disease of native coronary artery without angina pectoris: Secondary | ICD-10-CM | POA: Insufficient documentation

## 2017-09-15 ENCOUNTER — Encounter (HOSPITAL_COMMUNITY)
Admission: RE | Admit: 2017-09-15 | Discharge: 2017-09-15 | Disposition: A | Discharge status: Home / Self Care | Payer: Medicare Other | Source: Ambulatory Visit | Attending: Cardiovascular Disease | Admitting: Cardiovascular Disease

## 2017-09-17 ENCOUNTER — Encounter (HOSPITAL_COMMUNITY)
Admission: RE | Admit: 2017-09-17 | Discharge: 2017-09-17 | Disposition: A | Discharge status: Home / Self Care | Payer: Self-pay | Source: Ambulatory Visit | Attending: Cardiovascular Disease | Admitting: Cardiovascular Disease

## 2017-09-19 ENCOUNTER — Encounter (HOSPITAL_COMMUNITY)
Admission: RE | Admit: 2017-09-19 | Discharge: 2017-09-19 | Disposition: A | Discharge status: Home / Self Care | Payer: Self-pay | Source: Ambulatory Visit | Attending: Cardiovascular Disease | Admitting: Cardiovascular Disease

## 2017-09-22 ENCOUNTER — Encounter (HOSPITAL_COMMUNITY): Payer: Self-pay

## 2017-09-24 ENCOUNTER — Encounter (HOSPITAL_COMMUNITY)
Admission: RE | Admit: 2017-09-24 | Discharge: 2017-09-24 | Disposition: A | Discharge status: Home / Self Care | Payer: Self-pay | Source: Ambulatory Visit | Attending: Cardiovascular Disease | Admitting: Cardiovascular Disease

## 2017-09-26 ENCOUNTER — Encounter (HOSPITAL_COMMUNITY)
Admission: RE | Admit: 2017-09-26 | Discharge: 2017-09-26 | Disposition: A | Discharge status: Home / Self Care | Payer: Self-pay | Source: Ambulatory Visit | Attending: Cardiovascular Disease | Admitting: Cardiovascular Disease

## 2017-09-29 ENCOUNTER — Encounter (HOSPITAL_COMMUNITY)
Admission: RE | Admit: 2017-09-29 | Discharge: 2017-09-29 | Disposition: A | Discharge status: Home / Self Care | Payer: Self-pay | Source: Ambulatory Visit | Attending: Cardiovascular Disease | Admitting: Cardiovascular Disease

## 2017-09-30 DIAGNOSIS — D692 Other nonthrombocytopenic purpura: Secondary | ICD-10-CM | POA: Diagnosis not present

## 2017-09-30 DIAGNOSIS — I2510 Atherosclerotic heart disease of native coronary artery without angina pectoris: Secondary | ICD-10-CM | POA: Diagnosis not present

## 2017-09-30 DIAGNOSIS — Z79899 Other long term (current) drug therapy: Secondary | ICD-10-CM | POA: Diagnosis not present

## 2017-09-30 DIAGNOSIS — E042 Nontoxic multinodular goiter: Secondary | ICD-10-CM | POA: Diagnosis not present

## 2017-09-30 DIAGNOSIS — Z7984 Long term (current) use of oral hypoglycemic drugs: Secondary | ICD-10-CM | POA: Diagnosis not present

## 2017-09-30 DIAGNOSIS — M8580 Other specified disorders of bone density and structure, unspecified site: Secondary | ICD-10-CM | POA: Diagnosis not present

## 2017-09-30 DIAGNOSIS — E119 Type 2 diabetes mellitus without complications: Secondary | ICD-10-CM | POA: Diagnosis not present

## 2017-10-01 ENCOUNTER — Encounter (HOSPITAL_COMMUNITY)
Admission: RE | Admit: 2017-10-01 | Discharge: 2017-10-01 | Disposition: A | Discharge status: Home / Self Care | Payer: Self-pay | Source: Ambulatory Visit | Attending: Cardiovascular Disease | Admitting: Cardiovascular Disease

## 2017-10-03 ENCOUNTER — Encounter (HOSPITAL_COMMUNITY)
Admission: RE | Admit: 2017-10-03 | Discharge: 2017-10-03 | Disposition: A | Discharge status: Home / Self Care | Payer: Medicare Other | Source: Ambulatory Visit | Attending: Cardiovascular Disease | Admitting: Cardiovascular Disease

## 2017-10-06 ENCOUNTER — Ambulatory Visit (INDEPENDENT_AMBULATORY_CARE_PROVIDER_SITE_OTHER): Payer: Medicare Other | Admitting: *Deleted

## 2017-10-06 ENCOUNTER — Encounter (HOSPITAL_COMMUNITY)
Admission: RE | Admit: 2017-10-06 | Discharge: 2017-10-06 | Disposition: A | Discharge status: Home / Self Care | Payer: Medicare Other | Source: Ambulatory Visit | Attending: Cardiovascular Disease | Admitting: Cardiovascular Disease

## 2017-10-06 DIAGNOSIS — I639 Cerebral infarction, unspecified: Secondary | ICD-10-CM | POA: Diagnosis not present

## 2017-10-06 NOTE — Progress Notes (Signed)
Carelink Summary Report / Loop Recorder 

## 2017-10-08 ENCOUNTER — Encounter (HOSPITAL_COMMUNITY)
Admission: RE | Admit: 2017-10-08 | Discharge: 2017-10-08 | Disposition: A | Discharge status: Home / Self Care | Payer: Medicare Other | Source: Ambulatory Visit | Attending: Cardiovascular Disease | Admitting: Cardiovascular Disease

## 2017-10-10 ENCOUNTER — Encounter (HOSPITAL_COMMUNITY): Payer: Self-pay

## 2017-10-15 ENCOUNTER — Encounter (HOSPITAL_COMMUNITY)
Admission: RE | Admit: 2017-10-15 | Discharge: 2017-10-15 | Disposition: A | Discharge status: Home / Self Care | Payer: Self-pay | Source: Ambulatory Visit | Attending: Internal Medicine | Admitting: Internal Medicine

## 2017-10-15 DIAGNOSIS — I2510 Atherosclerotic heart disease of native coronary artery without angina pectoris: Secondary | ICD-10-CM | POA: Insufficient documentation

## 2017-10-17 LAB — CUP PACEART REMOTE DEVICE CHECK
Implantable Pulse Generator Implant Date: 20180514
MDC IDC SESS DTM: 20190724121149

## 2017-10-22 ENCOUNTER — Encounter (HOSPITAL_COMMUNITY)
Admission: RE | Admit: 2017-10-22 | Discharge: 2017-10-22 | Disposition: A | Discharge status: Home / Self Care | Payer: Self-pay | Source: Ambulatory Visit | Attending: Cardiovascular Disease | Admitting: Cardiovascular Disease

## 2017-10-22 DIAGNOSIS — I10 Essential (primary) hypertension: Secondary | ICD-10-CM | POA: Diagnosis not present

## 2017-10-22 DIAGNOSIS — E782 Mixed hyperlipidemia: Secondary | ICD-10-CM | POA: Diagnosis not present

## 2017-10-22 DIAGNOSIS — E119 Type 2 diabetes mellitus without complications: Secondary | ICD-10-CM | POA: Diagnosis not present

## 2017-10-24 DIAGNOSIS — I69354 Hemiplegia and hemiparesis following cerebral infarction affecting left non-dominant side: Secondary | ICD-10-CM | POA: Diagnosis not present

## 2017-10-24 DIAGNOSIS — E1159 Type 2 diabetes mellitus with other circulatory complications: Secondary | ICD-10-CM | POA: Diagnosis not present

## 2017-10-24 DIAGNOSIS — E538 Deficiency of other specified B group vitamins: Secondary | ICD-10-CM | POA: Diagnosis not present

## 2017-10-24 DIAGNOSIS — F324 Major depressive disorder, single episode, in partial remission: Secondary | ICD-10-CM | POA: Diagnosis not present

## 2017-10-24 DIAGNOSIS — E782 Mixed hyperlipidemia: Secondary | ICD-10-CM | POA: Diagnosis not present

## 2017-10-24 DIAGNOSIS — F5105 Insomnia due to other mental disorder: Secondary | ICD-10-CM | POA: Diagnosis not present

## 2017-10-24 DIAGNOSIS — Z6826 Body mass index (BMI) 26.0-26.9, adult: Secondary | ICD-10-CM | POA: Diagnosis not present

## 2017-10-24 DIAGNOSIS — H906 Mixed conductive and sensorineural hearing loss, bilateral: Secondary | ICD-10-CM | POA: Diagnosis not present

## 2017-10-24 DIAGNOSIS — I10 Essential (primary) hypertension: Secondary | ICD-10-CM | POA: Diagnosis not present

## 2017-10-27 ENCOUNTER — Encounter (HOSPITAL_COMMUNITY)
Admission: RE | Admit: 2017-10-27 | Discharge: 2017-10-27 | Disposition: A | Discharge status: Home / Self Care | Payer: Self-pay | Source: Ambulatory Visit | Attending: Cardiovascular Disease | Admitting: Cardiovascular Disease

## 2017-11-04 LAB — CUP PACEART REMOTE DEVICE CHECK
Date Time Interrogation Session: 20190826124159
MDC IDC PG IMPLANT DT: 20180514

## 2017-11-10 ENCOUNTER — Ambulatory Visit (INDEPENDENT_AMBULATORY_CARE_PROVIDER_SITE_OTHER): Payer: Medicare Other | Admitting: *Deleted

## 2017-11-10 ENCOUNTER — Encounter (HOSPITAL_COMMUNITY)
Admission: RE | Admit: 2017-11-10 | Discharge: 2017-11-10 | Disposition: A | Discharge status: Home / Self Care | Payer: Self-pay | Source: Ambulatory Visit | Attending: Cardiovascular Disease | Admitting: Cardiovascular Disease

## 2017-11-10 DIAGNOSIS — I639 Cerebral infarction, unspecified: Secondary | ICD-10-CM

## 2017-11-11 LAB — CUP PACEART REMOTE DEVICE CHECK
Implantable Pulse Generator Implant Date: 20180514
MDC IDC SESS DTM: 20190928133602

## 2017-11-11 NOTE — Progress Notes (Signed)
Carelink Summary Report / Loop Recorder 

## 2017-11-12 ENCOUNTER — Encounter (HOSPITAL_COMMUNITY): Payer: Self-pay

## 2017-11-12 DIAGNOSIS — I2510 Atherosclerotic heart disease of native coronary artery without angina pectoris: Secondary | ICD-10-CM | POA: Insufficient documentation

## 2017-11-14 ENCOUNTER — Encounter (HOSPITAL_COMMUNITY)
Admission: RE | Admit: 2017-11-14 | Discharge: 2017-11-14 | Disposition: A | Discharge status: Home / Self Care | Payer: Self-pay | Source: Ambulatory Visit | Attending: Cardiovascular Disease | Admitting: Cardiovascular Disease

## 2017-11-17 ENCOUNTER — Encounter (HOSPITAL_COMMUNITY): Payer: Self-pay

## 2017-11-19 ENCOUNTER — Encounter (HOSPITAL_COMMUNITY): Payer: Self-pay

## 2017-11-21 ENCOUNTER — Encounter (HOSPITAL_COMMUNITY)
Admission: RE | Admit: 2017-11-21 | Discharge: 2017-11-21 | Disposition: A | Discharge status: Home / Self Care | Payer: Self-pay | Source: Ambulatory Visit | Attending: Cardiovascular Disease | Admitting: Cardiovascular Disease

## 2017-11-24 ENCOUNTER — Encounter (HOSPITAL_COMMUNITY)
Admission: RE | Admit: 2017-11-24 | Discharge: 2017-11-24 | Disposition: A | Discharge status: Home / Self Care | Payer: Self-pay | Source: Ambulatory Visit | Attending: Cardiovascular Disease | Admitting: Cardiovascular Disease

## 2017-11-25 DIAGNOSIS — L820 Inflamed seborrheic keratosis: Secondary | ICD-10-CM | POA: Diagnosis not present

## 2017-11-25 DIAGNOSIS — D2272 Melanocytic nevi of left lower limb, including hip: Secondary | ICD-10-CM | POA: Diagnosis not present

## 2017-11-25 DIAGNOSIS — L821 Other seborrheic keratosis: Secondary | ICD-10-CM | POA: Diagnosis not present

## 2017-11-25 DIAGNOSIS — Z85828 Personal history of other malignant neoplasm of skin: Secondary | ICD-10-CM | POA: Diagnosis not present

## 2017-11-25 DIAGNOSIS — D3617 Benign neoplasm of peripheral nerves and autonomic nervous system of trunk, unspecified: Secondary | ICD-10-CM | POA: Diagnosis not present

## 2017-11-25 DIAGNOSIS — D2239 Melanocytic nevi of other parts of face: Secondary | ICD-10-CM | POA: Diagnosis not present

## 2017-11-26 ENCOUNTER — Encounter (HOSPITAL_COMMUNITY)
Admission: RE | Admit: 2017-11-26 | Discharge: 2017-11-26 | Disposition: A | Discharge status: Home / Self Care | Payer: Self-pay | Source: Ambulatory Visit | Attending: Cardiovascular Disease | Admitting: Cardiovascular Disease

## 2017-11-27 DIAGNOSIS — Z23 Encounter for immunization: Secondary | ICD-10-CM | POA: Diagnosis not present

## 2017-11-28 ENCOUNTER — Encounter (HOSPITAL_COMMUNITY): Payer: Self-pay

## 2017-12-01 ENCOUNTER — Encounter (HOSPITAL_COMMUNITY)
Admission: RE | Admit: 2017-12-01 | Discharge: 2017-12-01 | Disposition: A | Discharge status: Home / Self Care | Payer: Self-pay | Source: Ambulatory Visit | Attending: Cardiovascular Disease | Admitting: Cardiovascular Disease

## 2017-12-03 ENCOUNTER — Encounter (HOSPITAL_COMMUNITY)
Admission: RE | Admit: 2017-12-03 | Discharge: 2017-12-03 | Disposition: A | Discharge status: Home / Self Care | Payer: Self-pay | Source: Ambulatory Visit | Attending: Cardiovascular Disease | Admitting: Cardiovascular Disease

## 2017-12-05 ENCOUNTER — Encounter (HOSPITAL_COMMUNITY)
Admission: RE | Admit: 2017-12-05 | Discharge: 2017-12-05 | Disposition: A | Discharge status: Home / Self Care | Payer: Self-pay | Source: Ambulatory Visit | Attending: Cardiovascular Disease | Admitting: Cardiovascular Disease

## 2017-12-08 ENCOUNTER — Encounter (HOSPITAL_COMMUNITY): Payer: Self-pay

## 2017-12-10 ENCOUNTER — Encounter (HOSPITAL_COMMUNITY): Payer: Self-pay

## 2017-12-11 ENCOUNTER — Ambulatory Visit (INDEPENDENT_AMBULATORY_CARE_PROVIDER_SITE_OTHER): Payer: Medicare Other | Admitting: *Deleted

## 2017-12-11 DIAGNOSIS — I639 Cerebral infarction, unspecified: Secondary | ICD-10-CM | POA: Diagnosis not present

## 2017-12-12 ENCOUNTER — Encounter (HOSPITAL_COMMUNITY): Payer: Self-pay

## 2017-12-12 DIAGNOSIS — I2510 Atherosclerotic heart disease of native coronary artery without angina pectoris: Secondary | ICD-10-CM | POA: Insufficient documentation

## 2017-12-12 NOTE — Progress Notes (Signed)
Carelink Summary Report / Loop Recorder 

## 2017-12-15 ENCOUNTER — Encounter (HOSPITAL_COMMUNITY): Payer: Self-pay

## 2017-12-17 ENCOUNTER — Encounter (HOSPITAL_COMMUNITY): Payer: Self-pay

## 2017-12-19 ENCOUNTER — Encounter (HOSPITAL_COMMUNITY): Payer: Self-pay

## 2017-12-22 ENCOUNTER — Encounter (HOSPITAL_COMMUNITY)
Admission: RE | Admit: 2017-12-22 | Discharge: 2017-12-22 | Disposition: A | Discharge status: Home / Self Care | Payer: Self-pay | Source: Ambulatory Visit | Attending: Cardiovascular Disease | Admitting: Cardiovascular Disease

## 2017-12-24 ENCOUNTER — Encounter (HOSPITAL_COMMUNITY)
Admission: RE | Admit: 2017-12-24 | Discharge: 2017-12-24 | Disposition: A | Discharge status: Home / Self Care | Payer: Self-pay | Source: Ambulatory Visit | Attending: Cardiovascular Disease | Admitting: Cardiovascular Disease

## 2017-12-26 ENCOUNTER — Encounter (HOSPITAL_COMMUNITY): Payer: Self-pay

## 2017-12-29 ENCOUNTER — Encounter (HOSPITAL_COMMUNITY): Payer: Self-pay

## 2017-12-31 ENCOUNTER — Encounter (HOSPITAL_COMMUNITY)
Admission: RE | Admit: 2017-12-31 | Discharge: 2017-12-31 | Disposition: A | Discharge status: Home / Self Care | Payer: Self-pay | Source: Ambulatory Visit | Attending: Cardiovascular Disease | Admitting: Cardiovascular Disease

## 2018-01-02 ENCOUNTER — Encounter (HOSPITAL_COMMUNITY)
Admission: RE | Admit: 2018-01-02 | Discharge: 2018-01-02 | Disposition: A | Discharge status: Home / Self Care | Payer: Self-pay | Source: Ambulatory Visit | Attending: Cardiovascular Disease | Admitting: Cardiovascular Disease

## 2018-01-02 LAB — CUP PACEART REMOTE DEVICE CHECK
Implantable Pulse Generator Implant Date: 20180514
MDC IDC SESS DTM: 20191031144009

## 2018-01-05 ENCOUNTER — Encounter (HOSPITAL_COMMUNITY): Payer: Self-pay

## 2018-01-06 DIAGNOSIS — E119 Type 2 diabetes mellitus without complications: Secondary | ICD-10-CM | POA: Diagnosis not present

## 2018-01-06 DIAGNOSIS — H26493 Other secondary cataract, bilateral: Secondary | ICD-10-CM | POA: Diagnosis not present

## 2018-01-06 DIAGNOSIS — H524 Presbyopia: Secondary | ICD-10-CM | POA: Diagnosis not present

## 2018-01-06 DIAGNOSIS — H353131 Nonexudative age-related macular degeneration, bilateral, early dry stage: Secondary | ICD-10-CM | POA: Diagnosis not present

## 2018-01-06 DIAGNOSIS — H35373 Puckering of macula, bilateral: Secondary | ICD-10-CM | POA: Diagnosis not present

## 2018-01-07 ENCOUNTER — Encounter (HOSPITAL_COMMUNITY): Payer: Self-pay

## 2018-01-09 ENCOUNTER — Encounter (HOSPITAL_COMMUNITY): Payer: Self-pay

## 2018-01-12 ENCOUNTER — Ambulatory Visit (INDEPENDENT_AMBULATORY_CARE_PROVIDER_SITE_OTHER): Payer: Medicare Other | Admitting: Cardiovascular Disease

## 2018-01-12 ENCOUNTER — Encounter (HOSPITAL_COMMUNITY): Payer: Self-pay

## 2018-01-12 ENCOUNTER — Encounter: Payer: Self-pay | Admitting: Cardiovascular Disease

## 2018-01-12 VITALS — BP 120/74 | HR 65 | Ht 63.0 in | Wt 152.4 lb

## 2018-01-12 DIAGNOSIS — E782 Mixed hyperlipidemia: Secondary | ICD-10-CM

## 2018-01-12 DIAGNOSIS — I2510 Atherosclerotic heart disease of native coronary artery without angina pectoris: Secondary | ICD-10-CM

## 2018-01-12 DIAGNOSIS — I639 Cerebral infarction, unspecified: Secondary | ICD-10-CM | POA: Diagnosis not present

## 2018-01-12 DIAGNOSIS — I480 Paroxysmal atrial fibrillation: Secondary | ICD-10-CM | POA: Diagnosis not present

## 2018-01-12 MED ORDER — RAMIPRIL 2.5 MG PO CAPS
2.5000 mg | ORAL_CAPSULE | Freq: Two times a day (BID) | ORAL | 3 refills | Status: DC
Start: 2018-01-12 — End: 2018-02-24

## 2018-01-12 NOTE — Patient Instructions (Signed)

## 2018-01-12 NOTE — Progress Notes (Signed)
Cardiology Office Note:    Date:  01/12/2018   ID:  Stacy [name], DOB [birth year], MRN [id number]  PCP:  Lillard Anes, MD  Cardiologist:  Sherren Mocha, MD  Electrophysiologist:  None   Referring MD: Lillard Anes,*   Chief Complaint  Patient presents with  . Follow-up  . Coronary Artery Disease    History of Present Illness:    Stacy [name] is a 84 y.o. female with a hx of coronary artery disease and stroke, presenting for follow-up evaluation.  The patient initially presented in 1997 with an anterior wall MI treated with balloon angioplasty of the LAD.  She later underwent stenting of the RCA in 2000.  Her most recent heart catheterization in 2008 demonstrated patency of her angioplasty and stent sites in the LAD and RCA, respectively.  The patient had a stroke in 2018.  A loop recorder demonstrated paroxysmal atrial fibrillation from which the patient has not had any associated symptoms.  She is now anticoagulated with apixaban.  The patient is here with her daughter today.  She is feeling very well.  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  She denies any symptoms of chest pain, shortness of breath, leg swelling, or heart palpitations.  Her loop data is reviewed and shows a low atrial fibrillation burden.  She is tolerating anticoagulation without problems.  She does have intermittent abdominal pain and has known gallstones.  She inquires about potential for gallbladder surgery and management of her anticoagulation in the setting of paroxysmal atrial fibrillation and previous stroke.  Past Medical History:  Diagnosis Date  . Anxiety   . Arthritis    Cervical DDD C5-6 and C6-7  . Atheroembolism   . Blue toe syndrome ([place])   . CAD (coronary artery disease)   . Cancer ([place])    skin BCC  Forehead and  SCC Left Lower Leg  . Carotid artery occlusion   . Chronic kidney disease, stage III (moderate) (HCC)   . DDD  (degenerative disc disease)   . Depression   . DM (diabetes mellitus) ([place])   . Gall stones   . Gallstones   . GERD (gastroesophageal reflux disease)   . Heart murmur   . Heart murmur   . Hemorrhoid   . Hemorrhoids   . Hiatal hernia   . History of shingles   . Hyperlipidemia   . Hypertension   . Leg pain   . Mild incontinence   . Multinodular goiter   . Multiple thyroid nodules    checked every year  . Myocardial infarction ([place]) 03/15/95  . Osteopenia   . Peptic ulcer disease   . Stenosis of right carotid artery   . Stomach cancer ([place]) 1980  . Stroke ([place])   . Thyroid nodule   . Vision abnormalities     Past Surgical History:  Procedure Laterality Date  . 3 epidural steroid injections    . APPENDECTOMY    . Basal Cell Cancer Removal     forehead  . CATARACT EXTRACTION    . CATARACT EXTRACTION  12/29/2003  . CORONARY ANGIOPLASTY  03-15-1995  . CORONARY ANGIOPLASTY WITH STENT PLACEMENT  05-19-1998  . CORONARY STENT PLACEMENT  2000  . DILATION AND CURETTAGE OF UTERUS    . ENDARTERECTOMY Right 04/13/2015   Procedure: ENDARTERECTOMY CAROTID;  Surgeon: Serafina Mitchell, MD;  Location: [facility] OR;  Service: Vascular;  Laterality: Right;  . ENDOMETRIAL BIOPSY    .  EYE SURGERY    . heart catherization    . HEMORRHOID SURGERY    . INCISIONAL HERNIA REPAIR    . INGUINAL HERNIA REPAIR    . LOOP RECORDER INSERTION N/A 06/24/2016   Procedure: Loop Recorder Insertion;  Surgeon: Deboraha Sprang, MD;  Location: [place] [facility];  Service: Cardiovascular;  Laterality: N/A;  . PATCH ANGIOPLASTY Right 04/13/2015   Procedure: PATCH ANGIOPLASTY;  Surgeon: Serafina Mitchell, MD;  Location: [facility] OR;  Service: Vascular;  Laterality: Right;  . PTCA    . SQUAMOUS CELL CARCINOMA EXCISION     left lower leg  . TEE WITHOUT CARDIOVERSION N/A 06/24/2016   Procedure: TRANSESOPHAGEAL ECHOCARDIOGRAM (TEE);  Surgeon: Dorothy Spark, MD;  Location: [facility] ENDOSCOPY;  Service: Cardiovascular;  Laterality: N/A;    . TONSILLECTOMY      Current Medications: Current Meds  Medication Sig  . ACCU-CHEK SMARTVIEW test strip 1 each by Other route as needed for other (blood sugar test strips).   Marland Kitchen acetaminophen (TYLENOL) 325 MG tablet Take 325 mg by mouth every 6 ([name]) hours as needed for mild pain or moderate pain.  Marland Kitchen apixaban (ELIQUIS) 5 MG TABS tablet Take 1 tablet (5 mg total) by mouth 2 (two) times daily.  . cholecalciferol (VITAMIN D) 400 UNITS TABS Take 400 Units by mouth daily.   . Coenzyme Q10 (COQ-10 PO) Take 50 mg by mouth daily. 1 tab po qd  . conjugated estrogens (PREMARIN) vaginal cream Place 1 g vaginally once a week. .625 mg once weekly  . dexlansoprazole (DEXILANT) 60 MG capsule Take 60 mg by mouth daily as needed (for acid reflux). Take 1 tablet ( Dexilant DR ) by mouth once daily  . diphenhydrAMINE (BENADRYL) 25 mg capsule Take 25 mg by mouth as needed for allergies.   Marland Kitchen docusate sodium (COLACE) 100 MG capsule Take 100 mg by mouth daily as needed.   . gabapentin (NEURONTIN) 100 MG capsule Take 1 tablet by mouth 3 (three) times daily.  . hydrochlorothiazide (MICROZIDE) 12.5 MG capsule Take 1 capsule (12.5 mg total) by mouth daily.  . hydrocortisone 2.5 % cream Apply 1 application topically daily as needed (for itching).   . Melatonin 5 MG CAPS Take 1 capsule by mouth at bedtime as needed (sleep).   . metFORMIN (GLUCOPHAGE) 500 MG tablet Take 500 mg by mouth 2 (two) times daily.  . metoprolol succinate (TOPROL-XL) 25 MG 24 hr tablet Take 1 tablet (25 mg total) by mouth daily.  . Multiple Vitamins-Minerals (ICAPS AREDS 2 PO) Take by mouth.  . NEOMYCIN-POLYMYXIN-HYDROCORTISONE (CORTISPORIN) 1 % SOLN otic solution Place 2 drops into both ears as directed.   . nitroGLYCERIN (NITROSTAT) 0.4 MG SL tablet Place 1 tablet (0.4 mg total) under the tongue every 5 (five) minutes as needed for chest pain.  . Polyethylene Glycol 400 (BLINK TEARS OP) Place 1 drop into both eyes daily as needed (for dry  eye). Reported on 05/05/2015  . ramipril (ALTACE) 2.5 MG capsule Take 1 capsule (2.5 mg total) by mouth 2 (two) times daily.  . rosuvastatin (CRESTOR) 10 MG tablet Take 1 tablet (10 mg total) by mouth daily.  . sertraline (ZOLOFT) 25 MG tablet Take 25 mg by mouth daily.   Marland Kitchen trolamine salicylate (ASPERCREME) 10 % cream Apply 1 application topically as needed for muscle pain.  . [DISCONTINUED] ramipril (ALTACE) 2.5 MG capsule Take 1 capsule (2.5 mg total) by mouth 2 (two) times daily.     Allergies:  Iohexol; Sulfonamide derivatives; Latex; Tranilast; Ivp dye [iodinated diagnostic agents]; Metformin and related; and Tape   Social History   Socioeconomic History  . Marital status: Widowed    Spouse name: Not on file  . Number of children: 1  . Years of education: Not on file  . Highest education level: Not on file  Occupational History  . Occupation: Retired    Fish farm manager: RETIRED    Comment: [employer]  . Financial resource strain: Not on file  . Food insecurity:    Worry: Not on file    Inability: Not on file  . Transportation needs:    Medical: Not on file    Non-medical: Not on file  Tobacco Use  . Smoking status: Former Smoker    Packs/day: 1.00    Years: 54.00    Pack years: 54.00    Types: Cigarettes    Last attempt to quit: 10/05/1998    Years since quitting: 19.2  . Smokeless tobacco: Never Used  Substance and Sexual Activity  . Alcohol use: No    Alcohol/week: 0.0 standard drinks  . Drug use: No  . Sexual activity: Never  Lifestyle  . Physical activity:    Days per week: Not on file    Minutes per session: Not on file  . Stress: Not on file  Relationships  . Social connections:    Talks on phone: Not on file    Gets together: Not on file    Attends religious service: Not on file    Active member of club or organization: Not on file    Attends meetings of clubs or organizations: Not on file    Relationship status: Not on file  Other Topics  Concern  . Not on file  Social History Narrative  . Not on file     Family History: The patient's family history includes Cancer in her father; Diabetes in her father; Hypertension in her paternal uncle; Parkinsonism in her mother; Prostate cancer in her father; Stroke in her paternal uncle. There is no history of Heart attack.  ROS:   Please see the history of present illness.    Positive for abdominal pain and hearing loss.  All other systems reviewed and are negative.  EKGs/Labs/Other Studies Reviewed:    The following studies were reviewed today: TEE Jun 30, 2016: Study Conclusions  - Left ventricle: Systolic function was mildly reduced. The   estimated ejection fraction was in the range of 45% to 50%. - Aortic valve: No evidence of vegetation. - Mitral valve: There was moderate regurgitation. - Left atrium: The atrium was dilated. No evidence of thrombus in   the atrial cavity or appendage. No evidence of thrombus in the   atrial cavity or appendage. - Right atrium: No evidence of thrombus in the atrial cavity or   appendage. - Atrial septum: No defect or patent foramen ovale was identified. - Tricuspid valve: There was moderate regurgitation. - Pulmonary arteries: PA peak pressure: 39 mm Hg (S).  Impressions:  - There is akinesis in the apical anterior and septal walls, no   thrombus was seen with Definity echocontrast. LVEF 45-50%.   Moderate mitral and tricuspid regurgitation.   No PFO by color Doppler or bubble study.   Mild pulmonary hypertension, RVSP 39 mmHg  Myocardial Perfusion Study 08-21-2016: Study Highlights     Nuclear stress EF: 56%.  Findings consistent with ischemia and prior myocardial infarction.  This is an intermediate risk study.  The left  ventricular ejection fraction is normal (55-65%).   Large anterior apical wall infarct with likely aneurysm formation. Small area of mild ischemia in basal lateral and basal inferior wall EF estimated  at 56% with apical akinesis but is likely lower given perfusion defect    EKG:  EKG is not ordered today.   Recent Labs: No results found for requested labs within last 8760 hours.  Recent Lipid Panel    Component Value Date/Time   CHOL 102 06/23/2016 0441   TRIG 116 06/23/2016 0441   HDL 42 06/23/2016 0441   CHOLHDL 2.4 06/23/2016 0441   VLDL 23 06/23/2016 0441   LDLCALC 37 06/23/2016 0441    Physical Exam:    VS:  BP 120/74   Pulse 65   Ht 5\' 3"  (1.6 m)   Wt 152 lb 6.4 oz (69.1 kg)   SpO2 93%   BMI 27.00 kg/m     Wt Readings from Last 3 Encounters:  01/12/18 152 lb 6.4 oz (69.1 kg)  07/22/17 151 lb (68.5 kg)  06/18/17 153 lb (69.4 kg)     GEN:  Well nourished, well developed in no acute distress HEENT: Normal NECK: No JVD; No carotid bruits LYMPHATICS: No lymphadenopathy CARDIAC: RRR, no murmurs, rubs, gallops RESPIRATORY:  Clear to auscultation without rales, wheezing or rhonchi  ABDOMEN: Soft, non-tender, non-distended MUSCULOSKELETAL:  No edema; No deformity  SKIN: Warm and dry NEUROLOGIC:  Alert and oriented x 3 PSYCHIATRIC:  Normal affect   ASSESSMENT:    1. PAF (paroxysmal atrial fibrillation) ([place])   2. Coronary artery disease involving native coronary artery of native heart without angina pectoris   3. Mixed hyperlipidemia    PLAN:    In order of problems listed above:  1. The patient is maintaining sinus rhythm with a low atrial fib burden.  She is tolerating anticoagulation with apixaban with no bleeding problems.  Her most recent labs from September 2019 are reviewed and demonstrates a hemoglobin of 13.2 and platelet count of 197,000.  If he requires gallbladder surgery will discuss with our pharmacy team the possibility of bridging her with low molecular weight heparin since she has a history of atrial fibrillation and stroke.  Otherwise I think she is at acceptable risk for surgery if this is required. 2. No anginal symptoms at present.  She is  not on antiplatelet therapy because of chronic oral anticoagulation. 3. Most recent lipids are reviewed with an excellent panel: Total cholesterol 120, HDL 47, LDL 48, triglycerides 123.   Medication Adjustments/Labs and Tests Ordered: Current medicines are reviewed at length with the patient today.  Concerns regarding medicines are outlined above.  No orders of the defined types were placed in this encounter.  Meds ordered this encounter  Medications  . ramipril (ALTACE) 2.5 MG capsule    Sig: Take 1 capsule (2.5 mg total) by mouth 2 (two) times daily.    Dispense:  180 capsule    Refill:  3    Patient Instructions  Medication Instructions:  Your provider recommends that you continue on your current medications as directed. Please refer to the Current Medication list given to you today.    Labwork: None  Testing/Procedures: None  Follow-Up: Your provider wants you to follow-up in: 6 months with Dr. Burt Knack. You will receive a reminder letter in the mail two months in advance. If you don't receive a letter, please call our office to schedule the follow-up appointment.    Any Other Special Instructions Will Be  Listed Below (If Applicable).     If you need a refill on your cardiac medications before your next appointment, please call your pharmacy.     Signed, Sherren Mocha, MD  01/12/2018 1:24 PM    [facility]

## 2018-01-13 ENCOUNTER — Ambulatory Visit (INDEPENDENT_AMBULATORY_CARE_PROVIDER_SITE_OTHER): Payer: Medicare Other

## 2018-01-13 DIAGNOSIS — I639 Cerebral infarction, unspecified: Secondary | ICD-10-CM | POA: Diagnosis not present

## 2018-01-14 ENCOUNTER — Encounter (HOSPITAL_COMMUNITY)
Admission: RE | Admit: 2018-01-14 | Discharge: 2018-01-14 | Disposition: A | Discharge status: Home / Self Care | Payer: Self-pay | Source: Ambulatory Visit | Attending: Cardiovascular Disease | Admitting: Cardiovascular Disease

## 2018-01-14 NOTE — Progress Notes (Signed)
Carelink Summary Report / Loop Recorder 

## 2018-01-16 ENCOUNTER — Encounter (HOSPITAL_COMMUNITY)
Admission: RE | Admit: 2018-01-16 | Discharge: 2018-01-16 | Disposition: A | Discharge status: Home / Self Care | Payer: Self-pay | Source: Ambulatory Visit | Attending: Cardiovascular Disease | Admitting: Cardiovascular Disease

## 2018-01-19 ENCOUNTER — Encounter (HOSPITAL_COMMUNITY): Payer: Self-pay

## 2018-01-19 DIAGNOSIS — E538 Deficiency of other specified B group vitamins: Secondary | ICD-10-CM | POA: Diagnosis not present

## 2018-01-21 ENCOUNTER — Encounter (HOSPITAL_COMMUNITY)
Admission: RE | Admit: 2018-01-21 | Discharge: 2018-01-21 | Disposition: A | Discharge status: Home / Self Care | Payer: Self-pay | Source: Ambulatory Visit | Attending: Cardiovascular Disease | Admitting: Cardiovascular Disease

## 2018-01-23 ENCOUNTER — Encounter (HOSPITAL_COMMUNITY): Payer: Self-pay

## 2018-01-26 ENCOUNTER — Encounter (HOSPITAL_COMMUNITY): Payer: Self-pay

## 2018-01-28 ENCOUNTER — Encounter (HOSPITAL_COMMUNITY)
Admission: RE | Admit: 2018-01-28 | Discharge: 2018-01-28 | Disposition: A | Discharge status: Home / Self Care | Payer: Self-pay | Source: Ambulatory Visit | Attending: Cardiovascular Disease | Admitting: Cardiovascular Disease

## 2018-01-30 ENCOUNTER — Encounter (HOSPITAL_COMMUNITY)
Admission: RE | Admit: 2018-01-30 | Discharge: 2018-01-30 | Disposition: A | Discharge status: Home / Self Care | Payer: Self-pay | Source: Ambulatory Visit | Attending: Cardiovascular Disease | Admitting: Cardiovascular Disease

## 2018-02-02 ENCOUNTER — Encounter (HOSPITAL_COMMUNITY): Payer: Self-pay

## 2018-02-06 ENCOUNTER — Encounter (HOSPITAL_COMMUNITY)
Admission: RE | Admit: 2018-02-06 | Discharge: 2018-02-06 | Disposition: A | Discharge status: Home / Self Care | Payer: Self-pay | Source: Ambulatory Visit | Attending: Cardiovascular Disease | Admitting: Cardiovascular Disease

## 2018-02-09 ENCOUNTER — Encounter (HOSPITAL_COMMUNITY)
Admission: RE | Admit: 2018-02-09 | Discharge: 2018-02-09 | Disposition: A | Discharge status: Home / Self Care | Payer: Self-pay | Source: Ambulatory Visit | Attending: Cardiovascular Disease | Admitting: Cardiovascular Disease

## 2018-02-11 LAB — CUP PACEART REMOTE DEVICE CHECK
Implantable Pulse Generator Implant Date: 20180514
MDC IDC SESS DTM: 20191203151047

## 2018-02-13 ENCOUNTER — Encounter (HOSPITAL_COMMUNITY): Payer: Self-pay

## 2018-02-13 DIAGNOSIS — I2510 Atherosclerotic heart disease of native coronary artery without angina pectoris: Secondary | ICD-10-CM | POA: Insufficient documentation

## 2018-02-16 ENCOUNTER — Encounter (HOSPITAL_COMMUNITY): Payer: Self-pay

## 2018-02-16 ENCOUNTER — Ambulatory Visit (INDEPENDENT_AMBULATORY_CARE_PROVIDER_SITE_OTHER): Payer: Medicare Other

## 2018-02-16 DIAGNOSIS — I639 Cerebral infarction, unspecified: Secondary | ICD-10-CM | POA: Diagnosis not present

## 2018-02-17 ENCOUNTER — Telehealth: Payer: Self-pay | Admitting: *Deleted

## 2018-02-17 LAB — CUP PACEART REMOTE DEVICE CHECK
Date Time Interrogation Session: 20200105164116
Implantable Pulse Generator Implant Date: 20180514

## 2018-02-17 NOTE — Progress Notes (Signed)
Carelink Summary Report / Loop Recorder 

## 2018-02-17 NOTE — Telephone Encounter (Signed)
Spoke with the patient she says she sleeps late and does not get up until 11:00am to 12:00pm.  She does not recall any symptoms of her heart racing on 02/14/18.  Her monito is showing error code 3230.  Stacy [name] is going to call her tomorrow around 4:30 to try and help.  Will forward to Stacy [name] and [place] for review.

## 2018-02-18 ENCOUNTER — Encounter (HOSPITAL_COMMUNITY): Payer: Self-pay

## 2018-02-18 DIAGNOSIS — I10 Essential (primary) hypertension: Secondary | ICD-10-CM | POA: Diagnosis not present

## 2018-02-18 DIAGNOSIS — E1159 Type 2 diabetes mellitus with other circulatory complications: Secondary | ICD-10-CM | POA: Diagnosis not present

## 2018-02-18 DIAGNOSIS — E782 Mixed hyperlipidemia: Secondary | ICD-10-CM | POA: Diagnosis not present

## 2018-02-18 NOTE — Telephone Encounter (Signed)
Spoke w/ pt and attempted to help her trouble shoot her home monitor. After 2 unsuccessful attempts. I instructed pt to call tech support. Pt verbalized understanding.

## 2018-02-19 NOTE — Telephone Encounter (Signed)
Patient daughter was calling to follow up on the Tuesday (02-17-2018). I informed her that we needed pt to send a manual transmission w/ the home monitor. Pt daughter stated that she will send the transmission later today.

## 2018-02-19 NOTE — Telephone Encounter (Signed)
Manual transmission received. Tachy episode ECG printed and placed in Dr. Janalyn Harder folder for review.

## 2018-02-19 NOTE — Telephone Encounter (Signed)
Patient daughter would like to be called on her mobile number at [phone].

## 2018-02-19 NOTE — Telephone Encounter (Signed)
Patient daughter called back and wanted to make sure we received the manual transmission. Informed her that we did receive it. She wanted to know what was going on w/ pt heart. After consulting w/ Device Tech RN I informed pt daughter that patient had a fast heart rate that lasted for 59 seconds and we are waiting for MD to review. Pt daughter verbalized understanding she is requesting a call back to discuss further. She understands it may be tomorrow before someone can call her back. She also stated that patient had recent lab work and her kidney function was low. Pt PCP wanted to d/c Altace but they are wanting to know what cardiac MD says about d/c Altace.

## 2018-02-20 ENCOUNTER — Encounter (HOSPITAL_COMMUNITY): Payer: Self-pay

## 2018-02-20 DIAGNOSIS — E1159 Type 2 diabetes mellitus with other circulatory complications: Secondary | ICD-10-CM | POA: Diagnosis not present

## 2018-02-20 DIAGNOSIS — H906 Mixed conductive and sensorineural hearing loss, bilateral: Secondary | ICD-10-CM | POA: Diagnosis not present

## 2018-02-20 DIAGNOSIS — Z6826 Body mass index (BMI) 26.0-26.9, adult: Secondary | ICD-10-CM | POA: Diagnosis not present

## 2018-02-20 DIAGNOSIS — I10 Essential (primary) hypertension: Secondary | ICD-10-CM | POA: Diagnosis not present

## 2018-02-20 DIAGNOSIS — N183 Chronic kidney disease, stage 3 (moderate): Secondary | ICD-10-CM | POA: Diagnosis not present

## 2018-02-20 DIAGNOSIS — E782 Mixed hyperlipidemia: Secondary | ICD-10-CM | POA: Diagnosis not present

## 2018-02-20 DIAGNOSIS — H353131 Nonexudative age-related macular degeneration, bilateral, early dry stage: Secondary | ICD-10-CM | POA: Diagnosis not present

## 2018-02-20 DIAGNOSIS — E1122 Type 2 diabetes mellitus with diabetic chronic kidney disease: Secondary | ICD-10-CM | POA: Diagnosis not present

## 2018-02-20 NOTE — Telephone Encounter (Signed)
LVM for pts daughter to call device clinic back to make apt to change loop recorder parameters per SK. Also Altace was prescribed by Dr. Burt Knack therefore recommendations for Altace would need to come from Dr. Burt Knack

## 2018-02-20 NOTE — Telephone Encounter (Signed)
Addendum to last note:  Dr Caryl Comes would like to change detection on Linq for > 120bpm to show onset of pt's tachycardia.   He is ok with pt taking Altace, however will need a BMP with her PCP two weeks after beginning.

## 2018-02-20 NOTE — Telephone Encounter (Signed)
Per Dr Caryl Comes, he is ok if patient starts Altace.

## 2018-02-23 ENCOUNTER — Encounter (HOSPITAL_COMMUNITY): Payer: Self-pay

## 2018-02-24 ENCOUNTER — Encounter: Payer: Self-pay | Admitting: Family

## 2018-02-24 ENCOUNTER — Other Ambulatory Visit: Payer: Self-pay

## 2018-02-24 ENCOUNTER — Ambulatory Visit (HOSPITAL_COMMUNITY)
Admission: RE | Admit: 2018-02-24 | Discharge: 2018-02-24 | Disposition: A | Discharge status: Home / Self Care | Payer: Medicare Other | Source: Ambulatory Visit | Attending: Family | Admitting: Family

## 2018-02-24 ENCOUNTER — Ambulatory Visit (INDEPENDENT_AMBULATORY_CARE_PROVIDER_SITE_OTHER): Payer: Medicare Other | Admitting: Family

## 2018-02-24 VITALS — BP 136/74 | HR 67 | Temp 98.0°F | Resp 18 | Ht 63.0 in | Wt 152.0 lb

## 2018-02-24 DIAGNOSIS — Z9889 Other specified postprocedural states: Secondary | ICD-10-CM

## 2018-02-24 DIAGNOSIS — I6521 Occlusion and stenosis of right carotid artery: Secondary | ICD-10-CM | POA: Diagnosis not present

## 2018-02-24 NOTE — Patient Instructions (Signed)

## 2018-02-24 NOTE — Telephone Encounter (Signed)
Patient is scheduled for LINQ reprogramming on 03/04/18.

## 2018-02-24 NOTE — Progress Notes (Signed)
Chief Complaint: Follow up Extracranial Carotid Artery Stenosis   History of Present Illness  Stacy [name] is a 85 y.o. female who is s/pright carotid endarterectomy with patch angioplasty for asymptomatic right carotid stenosison03/03/2015 by Dr. Trula Slade. Intraoperative findings included an 80% stenosis.   Her pedal pulses were not palpable ata previousvisit, she has no claudication symptoms, no non healing wounds.  Her ABI's in January 2016 were normal with all triphasic waveforms, TBI's were below normal.  She has right sciatic pain, was taking acupuncture for this, states this has not helped. Her walking is limited by her right sciatic pain.   She had an MI in 1997, angioplasty, had a cardiac stent placed in 2000. Dr. Sherren Mocha is her cardiologist.   She had a stroke in May 2018, as manifested bay left leg giving way, no left arm hemiparesis or hemiplegia, no speech or vision changes.  Shortly after this she had a Loop recorder placed and was found to be in atrial fib, was then started on Eliquis. Left leg weakness has resolved.    Diabetic: yes, A1C on 02-18-18 was 8.0, GFR was 41 (review of records from her PCP) Tobacco use: former smoker, quit in 2000  Pt meds include: Statin : yes ASA: no Other anticoagulants/antiplatelets: Eliquis   Past Medical History:  Diagnosis Date  . Anxiety   . Arthritis    Cervical DDD C5-6 and C6-7  . Atheroembolism   . Blue toe syndrome ([place])   . CAD (coronary artery disease)   . Cancer ([place])    skin BCC  Forehead and  SCC Left Lower Leg  . Carotid artery occlusion   . Chronic kidney disease, stage III (moderate) (HCC)   . DDD (degenerative disc disease)   . Depression   . DM (diabetes mellitus) ([place])   . Gall stones   . Gallstones   . GERD (gastroesophageal reflux disease)   . Heart murmur   . Heart murmur   . Hemorrhoid   . Hemorrhoids   . Hiatal hernia   . History of shingles   . Hyperlipidemia   .  Hypertension   . Leg pain   . Mild incontinence   . Multinodular goiter   . Multiple thyroid nodules    checked every year  . Myocardial infarction ([place]) 03/15/95  . Osteopenia   . Peptic ulcer disease   . Stenosis of right carotid artery   . Stomach cancer ([place]) 1980  . Stroke ([place])   . Thyroid nodule   . Vision abnormalities     Social History Social History   Tobacco Use  . Smoking status: Former Smoker    Packs/day: 1.00    Years: 54.00    Pack years: 54.00    Types: Cigarettes    Last attempt to quit: 10/05/1998    Years since quitting: 19.4  . Smokeless tobacco: Never Used  Substance Use Topics  . Alcohol use: No    Alcohol/week: 0.0 standard drinks  . Drug use: No    Family History Family History  Problem Relation Age of Onset  . Diabetes Father   . Prostate cancer Father        meta to colon  . Cancer Father        Prostate  . Parkinsonism Mother   . Stroke Paternal Uncle   . Hypertension Paternal Uncle   . Heart attack Neg Hx     Surgical History Past Surgical History:  Procedure Laterality Date  .  3 epidural steroid injections    . APPENDECTOMY    . Basal Cell Cancer Removal     forehead  . CATARACT EXTRACTION    . CATARACT EXTRACTION  12/29/2003  . CORONARY ANGIOPLASTY  03-15-1995  . CORONARY ANGIOPLASTY WITH STENT PLACEMENT  05-19-1998  . CORONARY STENT PLACEMENT  2000  . DILATION AND CURETTAGE OF UTERUS    . ENDARTERECTOMY Right 04/13/2015   Procedure: ENDARTERECTOMY CAROTID;  Surgeon: Serafina Mitchell, MD;  Location: [facility] OR;  Service: Vascular;  Laterality: Right;  . ENDOMETRIAL BIOPSY    . EYE SURGERY    . heart catherization    . HEMORRHOID SURGERY    . INCISIONAL HERNIA REPAIR    . INGUINAL HERNIA REPAIR    . LOOP RECORDER INSERTION N/A 06/24/2016   Procedure: Loop Recorder Insertion;  Surgeon: Deboraha Sprang, MD;  Location: [facility];  Service: Cardiovascular;  Laterality: N/A;  . PATCH ANGIOPLASTY Right 04/13/2015   Procedure:  PATCH ANGIOPLASTY;  Surgeon: Serafina Mitchell, MD;  Location: [facility] OR;  Service: Vascular;  Laterality: Right;  . PTCA    . SQUAMOUS CELL CARCINOMA EXCISION     left lower leg  . TEE WITHOUT CARDIOVERSION N/A 06/24/2016   Procedure: TRANSESOPHAGEAL ECHOCARDIOGRAM (TEE);  Surgeon: Dorothy Spark, MD;  Location[facility] ENDOSCOPY;  Service: Cardiovascular;  Laterality: N/A;  . TONSILLECTOMY      Allergies  Allergen Reactions  . Iohexol Other (See Comments)     Desc: HIVES- 13 HR PRE-MEDS ARE REQUIRED-ASM- 03/21/05,SULFA,ADHESIVE TAPE   . Sulfonamide Derivatives Hives    REACTION: hives  . Latex Other (See Comments)    Adhesive tape and EKG adhesive leads to rash  . Tranilast Hives    Investigational medication  . Ivp Dye [Iodinated Diagnostic Agents]   . Metformin And Related Diarrhea    denies  . Tape     Adhesive tap    Current Outpatient Medications  Medication Sig Dispense Refill  . ACCU-CHEK SMARTVIEW test strip 1 each by Other route as needed for other (blood sugar test strips).   11  . acetaminophen (TYLENOL) 325 MG tablet Take 325 mg by mouth every 6 (six) hours as needed for mild pain or moderate pain.    Marland Kitchen apixaban (ELIQUIS) 5 MG TABS tablet Take 1 tablet (5 mg total) by mouth 2 (two) times daily. 180 tablet 3  . cholecalciferol (VITAMIN D) 400 UNITS TABS Take 400 Units by mouth daily.     . Coenzyme Q10 (COQ-10 PO) Take 50 mg by mouth daily. 1 tab po qd    . conjugated estrogens (PREMARIN) vaginal cream Place 1 g vaginally once a week. .625 mg once weekly    . diphenhydrAMINE (BENADRYL) 25 mg capsule Take 25 mg by mouth as needed for allergies.     Marland Kitchen docusate sodium (COLACE) 100 MG capsule Take 100 mg by mouth daily as needed.     . hydrochlorothiazide (MICROZIDE) 12.5 MG capsule Take 1 capsule (12.5 mg total) by mouth daily. 90 capsule 3  . hydrocortisone 2.5 % cream Apply 1 application topically daily as needed (for itching).   1  . Melatonin 5 MG CAPS Take 1 capsule by  mouth at bedtime as needed (sleep).     . metFORMIN (GLUCOPHAGE) 500 MG tablet Take 500 mg by mouth 2 (two) times daily.    . metoprolol succinate (TOPROL-XL) 25 MG 24 hr tablet Take 1 tablet (25 mg total) by mouth daily. [place]  tablet 3  . Multiple Vitamins-Minerals (ICAPS AREDS 2 PO) Take by mouth.    . NEOMYCIN-POLYMYXIN-HYDROCORTISONE (CORTISPORIN) 1 % SOLN otic solution Place 2 drops into both ears as directed.   3  . nitroGLYCERIN (NITROSTAT) 0.4 MG SL tablet Place 1 tablet (0.4 mg total) under the tongue every 5 (five) minutes as needed for chest pain. 25 tablet 3  . Polyethylene Glycol 400 (BLINK TEARS OP) Place 1 drop into both eyes daily as needed (for dry eye). Reported on 05/05/2015    . rosuvastatin (CRESTOR) 10 MG tablet Take 1 tablet (10 mg total) by mouth daily. 90 tablet 3  . sertraline (ZOLOFT) 25 MG tablet Take 25 mg by mouth daily.     Marland Kitchen trolamine salicylate (ASPERCREME) 10 % cream Apply 1 application topically as needed for muscle pain.     No current facility-administered medications for this visit.     Review of Systems : See HPI for pertinent positives and negatives.  Physical Examination  Vitals:   02/24/18 1429 02/24/18 1434  BP: 132/74 136/74  Pulse: 67   Resp: 18   Temp: 98 F (36.7 C)   SpO2: 93%   Weight: 152 lb (68.9 kg)   Height: 5' 3"  (1.6 m)    Body mass index is 26.93 kg/m.  General: WDWN female in NAD GAIT: normal Eyes: PERRLA HENT: No gross abnormalities.  Pulmonary:  Respirations are non-labored, good air movement in all fields, CTAB, no rales, rhonchi, or wheezes. Cardiac: Irregular rhythm with controlled rate, no detected murmur.  VASCULAR EXAM Carotid Bruits Right Left   Negative Negative     Abdominal aortic pulse is not palpable. Radial pulses are 2+ palpable and equal.                                                                                                                            LE Pulses Right Left       POPLITEAL  not  palpable   not palpable       POSTERIOR TIBIAL  not palpable   not palpable        DORSALIS PEDIS      ANTERIOR TIBIAL  palpable   palpable     Gastrointestinal: soft, nontender, BS WNL, no r/g, no palpable masses. Musculoskeletal: no muscle atrophy/wasting. M/S 5/5 in upper extremities, 4/5 in lower extremities, extremities without ischemic changes. Skin: No rashes, no ulcers, no cellulitis.   Neurologic:  A&O X 3; appropriate affect, sensation is normal; speech is normal, CN 2-12 intact except has some hearing loss, pain and light touch intact in extremities, motor exam as listed above. Psychiatric: Normal thought content, mood appropriate to clinical situation.    Assessment: Stacy [name] is a 85 y.o. female who is s/pright carotid endarterectomy with patch angioplasty for asymptomatic right carotid stenosison03/03/2015.  She had a small stroke in May 2018, infarct centered in the anterior body of corpus callosum extending into right frontal periventricular  white matter.  Stroke manifested as left leg giving way, no left arm sx's, no vision or speech changes. She has no residual left leg weakness.  Cardiac loop recorder about that time showed atrial fib, she was started on Eliquis.   She has not had any subsequent stroke or TIA.   Her atherosclerotic risk factors include uncontroled DM, CAD, and former smoker.   Uncontrolled DM: She is unable to tolerate the metformin prescribed due to diarrhea; we discussed asking her PCP about the extended release form of metformin, which tends to have less GI side effects, and also a medication in the Januvia class.  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  Pt had some imaging in the past which indicated a small AAA; but 4 serial abdominal duplex have indicated no evidence of AAA. Will no longer need to monitor for AAA.   DATA  Carotid Duplex (02-24-18): Right ICA: CEA site with 1-39%  stenosis Left ICA: 1-39% stenosis Bilateral vertebral artery flow is antegrade.  Bilateral subclavian artery waveforms are normal.  Minimal stenosis in the right ICA, CEA site, compared to no stenosis on the exam of 01-31-17.   MR Brain w/o contrast (06-23-16): IMPRESSION: 1. Small acute/early subacute infarction centered in the anterior body of corpus callosum extending into right frontal periventricular white matter. No acute hemorrhage identified. 2. Mild progression of chronic microvascular ischemic changes and parenchymal volume loss of the brain from 2008.   Plan: Follow-up in 1 year with Carotid Duplex scan.    I discussed in depth with the patient the nature of atherosclerosis, and emphasized the importance of maximal medical management including strict control of blood pressure, blood glucose, and lipid levels, obtaining regular exercise, and continued cessation of smoking.  The patient is aware that without maximal medical management the underlying atherosclerotic disease process will progress, limiting the benefit of any interventions. The patient was given information about stroke prevention and what symptoms should prompt the patient to seek immediate medical care. Thank you for allowing [place] to participate in this patient's care.  Clemon Chambers, RN, MSN, FNP-C [facility] Office: [phone]  Clinic Physician: Bishop Dublin  02/24/18 2:40 PM

## 2018-02-25 ENCOUNTER — Encounter (HOSPITAL_COMMUNITY)
Admission: RE | Admit: 2018-02-25 | Discharge: 2018-02-25 | Disposition: A | Discharge status: Home / Self Care | Payer: Self-pay | Source: Ambulatory Visit | Attending: Cardiovascular Disease | Admitting: Cardiovascular Disease

## 2018-02-26 ENCOUNTER — Other Ambulatory Visit: Payer: Self-pay | Admitting: Internal Medicine

## 2018-02-27 ENCOUNTER — Encounter (HOSPITAL_COMMUNITY): Payer: Self-pay

## 2018-02-27 ENCOUNTER — Other Ambulatory Visit: Payer: Self-pay | Admitting: Legal Medicine

## 2018-02-27 ENCOUNTER — Telehealth: Payer: Self-pay

## 2018-02-27 NOTE — Telephone Encounter (Signed)
See 1/7 phone note regarding Altace. Called patient to see how she is doing and clarify if she is taking or not.  Left message to call back.

## 2018-03-02 ENCOUNTER — Encounter (HOSPITAL_COMMUNITY)
Admission: RE | Admit: 2018-03-02 | Discharge: 2018-03-02 | Disposition: A | Discharge status: Home / Self Care | Payer: Self-pay | Source: Ambulatory Visit | Attending: Cardiovascular Disease | Admitting: Cardiovascular Disease

## 2018-03-02 NOTE — Telephone Encounter (Signed)
Called patient's daughter (DPR) back. She stated that Dr. Henrene Pastor, Patient's PCP, had patient stop Altace due to her lab work on 02/18/18. Her GFR was 41 and  creatinine 1.19. She also stated back on 10/23/18 that patient's GFR was 47 and creatinine 1.06. Patient's daughter wanted to know if Dr. Burt Knack wants her to stop Altace as well, and if so is there an alternative medication that he would like her to take.  Will forward to Dr. Burt Knack and his nurse. Daughter stated please call her back, not patient.

## 2018-03-02 NOTE — Telephone Encounter (Signed)
This is fine. Would just keep an eye on her BP. No need for an alternative to altace. thanks

## 2018-03-02 NOTE — Telephone Encounter (Signed)
Follow Up:     Returning Stacy [name]'s call from Friday.

## 2018-03-03 NOTE — Telephone Encounter (Signed)
Spoke with both Ms. Pryor Ochoa and Ms. Maricela Bo. Informed them Altace may be stopped and there is no need for an alternative medication. They will monitor BP and call if it increases. Scheduled Ms. Muriah for 6 mo follow up 6/3. They were grateful for call and agree with treatment plan.

## 2018-03-04 ENCOUNTER — Encounter (HOSPITAL_COMMUNITY): Payer: Self-pay

## 2018-03-04 ENCOUNTER — Ambulatory Visit (INDEPENDENT_AMBULATORY_CARE_PROVIDER_SITE_OTHER): Payer: Medicare Other | Admitting: Nurse Practitioner

## 2018-03-04 DIAGNOSIS — I4891 Unspecified atrial fibrillation: Secondary | ICD-10-CM | POA: Diagnosis not present

## 2018-03-04 NOTE — Progress Notes (Signed)
LINQ check in clinic. device reprogrammed to a tachy zone of 120bpm to see onset of tachycardia per SK. Pt reports decreased energy and appetite. Will check screening labs. Encouraged follow up with PCP if no improvement

## 2018-03-04 NOTE — Patient Instructions (Signed)
Medication Instructions:  none If you need a refill on your cardiac medications before your next appointment, please call your pharmacy.   Lab work:TODAY CBC BMET If you have labs (blood work) drawn today and your tests are completely normal, you will receive your results only by: Marland Kitchen MyChart Message (if you have MyChart) OR . A paper copy in the mail If you have any lab test that is abnormal or we need to change your treatment, we will call you to review the results.  Testing/Procedures: NONE  Follow-Up: At [facility], you and your health needs are our priority.  As part of our continuing mission to provide you with exceptional heart care, we have created designated Provider Care Teams.  These Care Teams include your primary Cardiologist (physician) and Advanced Practice Providers (APPs -  Physician Assistants and Nurse Practitioners) who all work together to provide you with the care you need, when you need it. .   Any Other Special Instructions Will Be Listed Below (If Applicable).

## 2018-03-05 ENCOUNTER — Telehealth: Payer: Self-pay

## 2018-03-05 LAB — BASIC METABOLIC PANEL
BUN/Creatinine Ratio: 16 (ref 12–28)
BUN: 20 mg/dL (ref 8–27)
CHLORIDE: 97 mmol/L (ref 96–106)
CO2: 24 mmol/L (ref 20–29)
Calcium: 10.7 mg/dL — ABNORMAL HIGH (ref 8.7–10.3)
Creatinine, Ser: 1.23 mg/dL — ABNORMAL HIGH (ref 0.57–1.00)
GFR, EST AFRICAN AMERICAN: 45 mL/min/{1.73_m2} — AB (ref >59)
GFR, EST NON AFRICAN AMERICAN: 39 mL/min/{1.73_m2} — AB (ref >59)
Glucose: 156 mg/dL — ABNORMAL HIGH (ref 65–99)
POTASSIUM: 4.3 mmol/L (ref 3.5–5.2)
Sodium: 139 mmol/L (ref 134–144)

## 2018-03-05 LAB — CBC
Hematocrit: 45.6 % (ref 34.0–46.6)
Hemoglobin: 15 g/dL (ref 11.1–15.9)
MCH: 29.1 pg (ref 26.6–33.0)
MCHC: 32.9 g/dL (ref 31.5–35.7)
MCV: 88 fL (ref 79–97)
Platelets: 236 10*3/uL (ref 150–450)
RBC: 5.16 x10E6/uL (ref 3.77–5.28)
RDW: 13.2 % (ref 11.7–15.4)
WBC: 9.7 10*3/uL (ref 3.4–10.8)

## 2018-03-05 NOTE — Telephone Encounter (Signed)
LVM for pt to return call. Pt needs to send in a remote transmission for recent AF/Tachy episode alert noted on 1/23 @ 0450.

## 2018-03-05 NOTE — Telephone Encounter (Signed)
Per presenting rhythm from manual transmission received on 03/05/18 at 1735, patient reverted to NSR. No changes at this time.

## 2018-03-06 ENCOUNTER — Encounter (HOSPITAL_COMMUNITY)
Admission: RE | Admit: 2018-03-06 | Discharge: 2018-03-06 | Disposition: A | Discharge status: Home / Self Care | Payer: Self-pay | Source: Ambulatory Visit | Attending: Cardiovascular Disease | Admitting: Cardiovascular Disease

## 2018-03-09 ENCOUNTER — Encounter (HOSPITAL_COMMUNITY)
Admission: RE | Admit: 2018-03-09 | Discharge: 2018-03-09 | Disposition: A | Discharge status: Home / Self Care | Payer: Self-pay | Source: Ambulatory Visit | Attending: Cardiovascular Disease | Admitting: Cardiovascular Disease

## 2018-03-11 ENCOUNTER — Encounter (HOSPITAL_COMMUNITY)
Admission: RE | Admit: 2018-03-11 | Discharge: 2018-03-11 | Disposition: A | Discharge status: Home / Self Care | Payer: Self-pay | Source: Ambulatory Visit | Attending: Cardiovascular Disease | Admitting: Cardiovascular Disease

## 2018-03-11 ENCOUNTER — Other Ambulatory Visit: Payer: Self-pay | Admitting: Internal Medicine

## 2018-03-13 ENCOUNTER — Encounter (HOSPITAL_COMMUNITY): Payer: Self-pay

## 2018-03-16 ENCOUNTER — Encounter (HOSPITAL_COMMUNITY)
Admission: RE | Admit: 2018-03-16 | Discharge: 2018-03-16 | Disposition: A | Discharge status: Home / Self Care | Payer: Self-pay | Source: Ambulatory Visit | Attending: Cardiovascular Disease | Admitting: Cardiovascular Disease

## 2018-03-16 DIAGNOSIS — I2510 Atherosclerotic heart disease of native coronary artery without angina pectoris: Secondary | ICD-10-CM | POA: Insufficient documentation

## 2018-03-18 ENCOUNTER — Encounter (HOSPITAL_COMMUNITY)
Admission: RE | Admit: 2018-03-18 | Discharge: 2018-03-18 | Disposition: A | Discharge status: Home / Self Care | Payer: Self-pay | Source: Ambulatory Visit | Attending: Cardiovascular Disease | Admitting: Cardiovascular Disease

## 2018-03-20 ENCOUNTER — Encounter (HOSPITAL_COMMUNITY): Payer: Self-pay

## 2018-03-20 ENCOUNTER — Ambulatory Visit (INDEPENDENT_AMBULATORY_CARE_PROVIDER_SITE_OTHER): Payer: Medicare Other

## 2018-03-20 DIAGNOSIS — I639 Cerebral infarction, unspecified: Secondary | ICD-10-CM

## 2018-03-21 LAB — CUP PACEART REMOTE DEVICE CHECK
Date Time Interrogation Session: 20200207163941
Implantable Pulse Generator Implant Date: 20180514

## 2018-03-23 ENCOUNTER — Encounter (HOSPITAL_COMMUNITY)
Admission: RE | Admit: 2018-03-23 | Discharge: 2018-03-23 | Disposition: A | Discharge status: Home / Self Care | Payer: Self-pay | Source: Ambulatory Visit | Attending: Cardiovascular Disease | Admitting: Cardiovascular Disease

## 2018-03-25 ENCOUNTER — Encounter (HOSPITAL_COMMUNITY): Payer: Self-pay

## 2018-03-26 DIAGNOSIS — R748 Abnormal levels of other serum enzymes: Secondary | ICD-10-CM | POA: Diagnosis not present

## 2018-03-26 DIAGNOSIS — I10 Essential (primary) hypertension: Secondary | ICD-10-CM | POA: Diagnosis not present

## 2018-03-27 ENCOUNTER — Encounter (HOSPITAL_COMMUNITY)
Admission: RE | Admit: 2018-03-27 | Discharge: 2018-03-27 | Disposition: A | Discharge status: Home / Self Care | Payer: Self-pay | Source: Ambulatory Visit | Attending: Cardiovascular Disease | Admitting: Cardiovascular Disease

## 2018-03-30 ENCOUNTER — Encounter (HOSPITAL_COMMUNITY): Payer: Self-pay

## 2018-03-30 DIAGNOSIS — D6489 Other specified anemias: Secondary | ICD-10-CM | POA: Diagnosis not present

## 2018-03-30 DIAGNOSIS — E1122 Type 2 diabetes mellitus with diabetic chronic kidney disease: Secondary | ICD-10-CM | POA: Diagnosis not present

## 2018-03-30 DIAGNOSIS — D528 Other folate deficiency anemias: Secondary | ICD-10-CM | POA: Diagnosis not present

## 2018-03-30 DIAGNOSIS — I10 Essential (primary) hypertension: Secondary | ICD-10-CM | POA: Diagnosis not present

## 2018-03-30 DIAGNOSIS — E559 Vitamin D deficiency, unspecified: Secondary | ICD-10-CM | POA: Diagnosis not present

## 2018-03-30 DIAGNOSIS — E782 Mixed hyperlipidemia: Secondary | ICD-10-CM | POA: Diagnosis not present

## 2018-03-30 DIAGNOSIS — R799 Abnormal finding of blood chemistry, unspecified: Secondary | ICD-10-CM | POA: Diagnosis not present

## 2018-03-30 DIAGNOSIS — R5383 Other fatigue: Secondary | ICD-10-CM | POA: Diagnosis not present

## 2018-03-30 DIAGNOSIS — E038 Other specified hypothyroidism: Secondary | ICD-10-CM | POA: Diagnosis not present

## 2018-03-30 DIAGNOSIS — N183 Chronic kidney disease, stage 3 (moderate): Secondary | ICD-10-CM | POA: Diagnosis not present

## 2018-03-31 ENCOUNTER — Telehealth: Payer: Self-pay

## 2018-03-31 DIAGNOSIS — E119 Type 2 diabetes mellitus without complications: Secondary | ICD-10-CM | POA: Diagnosis not present

## 2018-03-31 DIAGNOSIS — M8580 Other specified disorders of bone density and structure, unspecified site: Secondary | ICD-10-CM | POA: Diagnosis not present

## 2018-03-31 DIAGNOSIS — Z5181 Encounter for therapeutic drug level monitoring: Secondary | ICD-10-CM | POA: Diagnosis not present

## 2018-03-31 DIAGNOSIS — E042 Nontoxic multinodular goiter: Secondary | ICD-10-CM | POA: Diagnosis not present

## 2018-03-31 DIAGNOSIS — D692 Other nonthrombocytopenic purpura: Secondary | ICD-10-CM | POA: Diagnosis not present

## 2018-03-31 DIAGNOSIS — I2510 Atherosclerotic heart disease of native coronary artery without angina pectoris: Secondary | ICD-10-CM | POA: Diagnosis not present

## 2018-03-31 NOTE — Telephone Encounter (Signed)
Spoke with the pt and helped her send a manual transmission with her home monitor.

## 2018-03-31 NOTE — Telephone Encounter (Signed)
Patient called back and stated that she is waiting for a new monitor. I instructed her to send the manual transmission once the home monitor is received. Pt verbalized understanding.

## 2018-03-31 NOTE — Progress Notes (Signed)
Carelink Summary Report / Loop Recorder 

## 2018-04-01 ENCOUNTER — Encounter (HOSPITAL_COMMUNITY)
Admission: RE | Admit: 2018-04-01 | Discharge: 2018-04-01 | Disposition: A | Discharge status: Home / Self Care | Payer: Self-pay | Source: Ambulatory Visit | Attending: Cardiovascular Disease | Admitting: Cardiovascular Disease

## 2018-04-03 ENCOUNTER — Encounter (HOSPITAL_COMMUNITY): Payer: Self-pay

## 2018-04-06 ENCOUNTER — Encounter (HOSPITAL_COMMUNITY)
Admission: RE | Admit: 2018-04-06 | Discharge: 2018-04-06 | Disposition: A | Discharge status: Home / Self Care | Payer: Self-pay | Source: Ambulatory Visit | Attending: Cardiovascular Disease | Admitting: Cardiovascular Disease

## 2018-04-07 NOTE — Telephone Encounter (Addendum)
SVT episodes noted on LINQ, detected as "tachy" episodes. Most episodes short duration, <51min, median V rate 130bpm. Patient denies any symptoms with episodes.  Advised I will review with Dr. Caryl Comes and call her back if any new recommendations. She verbalizes agreement with this plan.

## 2018-04-07 NOTE — Telephone Encounter (Signed)
Transmission received.

## 2018-04-08 ENCOUNTER — Encounter (HOSPITAL_COMMUNITY)
Admission: RE | Admit: 2018-04-08 | Discharge: 2018-04-08 | Disposition: A | Discharge status: Home / Self Care | Payer: Self-pay | Source: Ambulatory Visit | Attending: Cardiovascular Disease | Admitting: Cardiovascular Disease

## 2018-04-09 NOTE — Telephone Encounter (Signed)
Dr. Caryl Comes said no further intervention for SVT episodes at this time as patient asymptomatic. Plan to reprogram ILR tachy detection rate to cover tachy episodes at appointment on 07/15/18 after f/u with Dr. Burt Knack.   Patient in agreement with plan, denies questions or concerns at this time.

## 2018-04-10 ENCOUNTER — Encounter (HOSPITAL_COMMUNITY)
Admission: RE | Admit: 2018-04-10 | Discharge: 2018-04-10 | Disposition: A | Discharge status: Home / Self Care | Payer: Self-pay | Source: Ambulatory Visit | Attending: Cardiovascular Disease | Admitting: Cardiovascular Disease

## 2018-04-13 ENCOUNTER — Encounter (HOSPITAL_COMMUNITY)
Admission: RE | Admit: 2018-04-13 | Discharge: 2018-04-13 | Disposition: A | Discharge status: Home / Self Care | Payer: Self-pay | Source: Ambulatory Visit | Attending: Cardiovascular Disease | Admitting: Cardiovascular Disease

## 2018-04-13 DIAGNOSIS — I2510 Atherosclerotic heart disease of native coronary artery without angina pectoris: Secondary | ICD-10-CM | POA: Insufficient documentation

## 2018-04-13 NOTE — Addendum Note (Signed)
Addended by: Harland German A on: 04/13/2018 03:05 PM   Modules accepted: Orders

## 2018-04-15 ENCOUNTER — Encounter (HOSPITAL_COMMUNITY)
Admission: RE | Admit: 2018-04-15 | Discharge: 2018-04-15 | Disposition: A | Discharge status: Home / Self Care | Payer: Self-pay | Source: Ambulatory Visit | Attending: Cardiovascular Disease | Admitting: Cardiovascular Disease

## 2018-04-17 ENCOUNTER — Encounter (HOSPITAL_COMMUNITY)
Admission: RE | Admit: 2018-04-17 | Discharge: 2018-04-17 | Disposition: A | Discharge status: Home / Self Care | Payer: Self-pay | Source: Ambulatory Visit | Attending: Cardiovascular Disease | Admitting: Cardiovascular Disease

## 2018-04-20 ENCOUNTER — Encounter (HOSPITAL_COMMUNITY)
Admission: RE | Admit: 2018-04-20 | Discharge: 2018-04-20 | Disposition: A | Discharge status: Home / Self Care | Payer: Self-pay | Source: Ambulatory Visit | Attending: Cardiovascular Disease | Admitting: Cardiovascular Disease

## 2018-04-22 ENCOUNTER — Encounter (HOSPITAL_COMMUNITY): Payer: Self-pay

## 2018-04-22 ENCOUNTER — Ambulatory Visit (INDEPENDENT_AMBULATORY_CARE_PROVIDER_SITE_OTHER): Payer: Medicare Other | Admitting: *Deleted

## 2018-04-22 DIAGNOSIS — I639 Cerebral infarction, unspecified: Secondary | ICD-10-CM

## 2018-04-23 LAB — CUP PACEART REMOTE DEVICE CHECK
Date Time Interrogation Session: 20200311181313
Implantable Pulse Generator Implant Date: 20180514

## 2018-04-24 ENCOUNTER — Encounter (HOSPITAL_COMMUNITY)
Admission: RE | Admit: 2018-04-24 | Discharge: 2018-04-24 | Disposition: A | Discharge status: Home / Self Care | Payer: Self-pay | Source: Ambulatory Visit | Attending: Cardiovascular Disease | Admitting: Cardiovascular Disease

## 2018-04-24 ENCOUNTER — Other Ambulatory Visit: Payer: Self-pay

## 2018-04-27 ENCOUNTER — Telehealth (HOSPITAL_COMMUNITY): Payer: Self-pay | Admitting: *Deleted

## 2018-04-27 ENCOUNTER — Encounter (HOSPITAL_COMMUNITY): Payer: Self-pay

## 2018-04-27 ENCOUNTER — Other Ambulatory Visit: Payer: Self-pay | Admitting: Internal Medicine

## 2018-04-27 NOTE — Telephone Encounter (Signed)
Contacted patient to notify of Cardiac Rehab department closure x2 weeks. Message left on patient's voicemail.   Hilari Wethington, Exercise Physiologist Cardiac and Pulmonary Rehabilitation 

## 2018-04-28 ENCOUNTER — Telehealth: Payer: Self-pay

## 2018-04-28 NOTE — Telephone Encounter (Signed)
I helped her send a manual transmission. I told the patient if the nurse see anything she will call her back. I told her if it looks normal she will not receive a phone call.

## 2018-04-29 ENCOUNTER — Encounter (HOSPITAL_COMMUNITY): Payer: Self-pay

## 2018-04-29 NOTE — Progress Notes (Signed)
Carelink Summary Report / Loop Recorder 

## 2018-05-01 ENCOUNTER — Encounter (HOSPITAL_COMMUNITY): Payer: Self-pay

## 2018-05-04 ENCOUNTER — Encounter (HOSPITAL_COMMUNITY): Payer: Self-pay

## 2018-05-05 ENCOUNTER — Telehealth (HOSPITAL_COMMUNITY): Payer: Self-pay | Admitting: *Deleted

## 2018-05-05 NOTE — Telephone Encounter (Signed)
Called to notify patient that the cardiac and pulmonary rehabilitation department will be closed for 4 weeks due to COVID-19 restrictions. Message left on patient's voicemail. Sol Passer, MS, ACSM CEP

## 2018-05-06 ENCOUNTER — Encounter (HOSPITAL_COMMUNITY): Payer: Self-pay

## 2018-05-08 ENCOUNTER — Encounter (HOSPITAL_COMMUNITY): Payer: Self-pay

## 2018-05-11 ENCOUNTER — Encounter (HOSPITAL_COMMUNITY): Payer: Self-pay

## 2018-05-13 ENCOUNTER — Encounter (HOSPITAL_COMMUNITY): Payer: Self-pay

## 2018-05-15 ENCOUNTER — Encounter (HOSPITAL_COMMUNITY): Payer: Self-pay

## 2018-05-18 ENCOUNTER — Encounter (HOSPITAL_COMMUNITY): Payer: Self-pay

## 2018-05-20 ENCOUNTER — Encounter (HOSPITAL_COMMUNITY): Payer: Self-pay

## 2018-05-22 ENCOUNTER — Encounter (HOSPITAL_COMMUNITY): Payer: Self-pay

## 2018-05-22 ENCOUNTER — Telehealth (HOSPITAL_COMMUNITY): Payer: Self-pay | Admitting: *Deleted

## 2018-05-22 NOTE — Telephone Encounter (Signed)
Called to notify patient that the cardiac and pulmonary rehabilitation department remains closed at this time due to COVID-19 restrictions. Pt verbalized understanding.  Sol Passer, MS, ACSM CEP

## 2018-05-25 ENCOUNTER — Encounter (HOSPITAL_COMMUNITY): Payer: Self-pay

## 2018-05-25 ENCOUNTER — Ambulatory Visit (INDEPENDENT_AMBULATORY_CARE_PROVIDER_SITE_OTHER): Payer: Medicare Other | Admitting: *Deleted

## 2018-05-25 ENCOUNTER — Other Ambulatory Visit: Payer: Self-pay

## 2018-05-25 DIAGNOSIS — I480 Paroxysmal atrial fibrillation: Secondary | ICD-10-CM

## 2018-05-25 DIAGNOSIS — I639 Cerebral infarction, unspecified: Secondary | ICD-10-CM | POA: Diagnosis not present

## 2018-05-25 LAB — CUP PACEART REMOTE DEVICE CHECK
Date Time Interrogation Session: 20200413183540
Implantable Pulse Generator Implant Date: 20180514

## 2018-05-27 ENCOUNTER — Encounter (HOSPITAL_COMMUNITY): Payer: Self-pay

## 2018-05-29 ENCOUNTER — Encounter (HOSPITAL_COMMUNITY): Payer: Self-pay

## 2018-06-01 ENCOUNTER — Encounter (HOSPITAL_COMMUNITY): Payer: Self-pay

## 2018-06-03 ENCOUNTER — Encounter (HOSPITAL_COMMUNITY): Payer: Self-pay

## 2018-06-05 ENCOUNTER — Encounter (HOSPITAL_COMMUNITY): Payer: Self-pay

## 2018-06-05 NOTE — Progress Notes (Signed)
Carelink Summary Report / Loop Recorder 

## 2018-06-08 ENCOUNTER — Encounter (HOSPITAL_COMMUNITY): Payer: Self-pay

## 2018-06-10 ENCOUNTER — Encounter (HOSPITAL_COMMUNITY): Payer: Self-pay

## 2018-06-12 ENCOUNTER — Encounter (HOSPITAL_COMMUNITY): Payer: Self-pay

## 2018-06-15 ENCOUNTER — Encounter (HOSPITAL_COMMUNITY): Payer: Self-pay

## 2018-06-17 ENCOUNTER — Encounter (HOSPITAL_COMMUNITY): Payer: Self-pay

## 2018-06-19 ENCOUNTER — Encounter (HOSPITAL_COMMUNITY): Payer: Self-pay

## 2018-06-22 ENCOUNTER — Encounter (HOSPITAL_COMMUNITY): Payer: Self-pay

## 2018-06-24 ENCOUNTER — Encounter (HOSPITAL_COMMUNITY): Payer: Self-pay

## 2018-06-26 ENCOUNTER — Encounter (HOSPITAL_COMMUNITY): Payer: Self-pay

## 2018-06-29 ENCOUNTER — Encounter (HOSPITAL_COMMUNITY): Payer: Self-pay

## 2018-06-29 ENCOUNTER — Other Ambulatory Visit: Payer: Self-pay

## 2018-06-29 ENCOUNTER — Ambulatory Visit (INDEPENDENT_AMBULATORY_CARE_PROVIDER_SITE_OTHER): Payer: Medicare Other | Admitting: *Deleted

## 2018-06-29 DIAGNOSIS — I639 Cerebral infarction, unspecified: Secondary | ICD-10-CM

## 2018-06-29 LAB — CUP PACEART REMOTE DEVICE CHECK
Date Time Interrogation Session: 20200516184105
Implantable Pulse Generator Implant Date: 20180514

## 2018-07-01 ENCOUNTER — Encounter (HOSPITAL_COMMUNITY): Payer: Self-pay

## 2018-07-03 ENCOUNTER — Encounter (HOSPITAL_COMMUNITY): Payer: Self-pay

## 2018-07-08 ENCOUNTER — Encounter (HOSPITAL_COMMUNITY): Payer: Self-pay

## 2018-07-08 ENCOUNTER — Telehealth: Payer: Self-pay | Admitting: Student

## 2018-07-08 ENCOUNTER — Telehealth: Payer: Self-pay | Admitting: Cardiovascular Disease

## 2018-07-08 DIAGNOSIS — I2510 Atherosclerotic heart disease of native coronary artery without angina pectoris: Secondary | ICD-10-CM

## 2018-07-08 NOTE — Telephone Encounter (Signed)
Pt daughter was returning [place] phone call. I told her you will give her a call back. The best to contact Mrs. Maricela Bo is [phone]. She states she will be by her cell phone to receive your call.

## 2018-07-08 NOTE — Telephone Encounter (Signed)
Contacted Mrs. Stacy [name]. Pt verbalized understanding that there is no need for appointment at this time. Her previous tachy events have stopped by her last device checks. No adjustments to be made.    Legrand Como [address] [place], PA-C 07/08/2018 3:09 PM

## 2018-07-08 NOTE — Telephone Encounter (Signed)
Attempted to call to discuss upcoming appointment with her daughter.   Left HIPPA compliant message to call back the device clinic with Mrs. Williamson's (Emergency contact) Network engineer.    Legrand Como [address]" [place], PA-C 07/08/2018 2:41 PM

## 2018-07-08 NOTE — Telephone Encounter (Signed)
-----   Message from Theodoro Parma, RN sent at 07/08/2018  2:22 PM EDT ----- Regarding: device check 6/3 I just spoke to this patient's daughter, Rodena Piety. She was asking about her check on 6/3 and if she will need to come in the office. Will someone call her please? Thanks so much! Valetta Fuller

## 2018-07-08 NOTE — Telephone Encounter (Signed)
Spoke with patient's daughter, Rodena Piety. She refuses to have e-visit at this time as she states the patient is doing well. She understands she will be called to arrange appointment once COVID-19 restrictions are lifted.

## 2018-07-08 NOTE — Telephone Encounter (Signed)
Patient's daughter is calling wanting to know if she can come with her mother to her appt with Dr Burt Knack as patient is hard of hearing.

## 2018-07-09 NOTE — Progress Notes (Signed)
Carelink Summary Report / Loop Recorder 

## 2018-07-10 ENCOUNTER — Encounter (HOSPITAL_COMMUNITY): Payer: Self-pay

## 2018-07-13 ENCOUNTER — Encounter (HOSPITAL_COMMUNITY): Payer: Self-pay

## 2018-07-15 ENCOUNTER — Ambulatory Visit: Payer: Medicare Other | Admitting: Cardiovascular Disease

## 2018-07-15 ENCOUNTER — Encounter (HOSPITAL_COMMUNITY): Payer: Self-pay

## 2018-07-17 ENCOUNTER — Encounter (HOSPITAL_COMMUNITY): Payer: Self-pay

## 2018-07-20 ENCOUNTER — Encounter (HOSPITAL_COMMUNITY): Payer: Self-pay

## 2018-07-22 ENCOUNTER — Encounter (HOSPITAL_COMMUNITY): Payer: Self-pay

## 2018-07-24 ENCOUNTER — Encounter (HOSPITAL_COMMUNITY): Payer: Self-pay

## 2018-07-27 ENCOUNTER — Encounter (HOSPITAL_COMMUNITY): Payer: Self-pay

## 2018-07-29 ENCOUNTER — Encounter (HOSPITAL_COMMUNITY): Payer: Self-pay

## 2018-07-30 ENCOUNTER — Ambulatory Visit (INDEPENDENT_AMBULATORY_CARE_PROVIDER_SITE_OTHER): Payer: Medicare Other | Admitting: *Deleted

## 2018-07-30 DIAGNOSIS — I639 Cerebral infarction, unspecified: Secondary | ICD-10-CM | POA: Diagnosis not present

## 2018-07-30 LAB — CUP PACEART REMOTE DEVICE CHECK
Date Time Interrogation Session: 20200618180206
Implantable Pulse Generator Implant Date: 20180514

## 2018-07-31 ENCOUNTER — Encounter (HOSPITAL_COMMUNITY): Payer: Self-pay

## 2018-08-03 ENCOUNTER — Encounter (HOSPITAL_COMMUNITY): Payer: Self-pay

## 2018-08-04 NOTE — Progress Notes (Signed)
Carelink Summary Report / Loop Recorder 

## 2018-08-05 ENCOUNTER — Encounter (HOSPITAL_COMMUNITY): Payer: Self-pay

## 2018-08-07 ENCOUNTER — Encounter (HOSPITAL_COMMUNITY): Payer: Self-pay

## 2018-08-10 ENCOUNTER — Encounter (HOSPITAL_COMMUNITY): Payer: Self-pay

## 2018-08-12 ENCOUNTER — Telehealth (HOSPITAL_COMMUNITY): Payer: Self-pay | Admitting: *Deleted

## 2018-08-12 ENCOUNTER — Encounter (HOSPITAL_COMMUNITY): Payer: Self-pay

## 2018-08-12 ENCOUNTER — Other Ambulatory Visit: Payer: Self-pay | Admitting: Cardiovascular Disease

## 2018-08-12 DIAGNOSIS — E785 Hyperlipidemia, unspecified: Secondary | ICD-10-CM

## 2018-08-12 DIAGNOSIS — I2510 Atherosclerotic heart disease of native coronary artery without angina pectoris: Secondary | ICD-10-CM

## 2018-08-12 MED ORDER — ROSUVASTATIN CALCIUM 10 MG PO TABS: 10.0000 mg | ORAL_TABLET | Freq: Every day | ORAL | 1 refills | Status: DC

## 2018-08-12 NOTE — Telephone Encounter (Signed)
Called to update patient on status of maintenance Cardiac Rehab exercise classes.  At this point they remain closed due to COVID-19 pandemic precautions.

## 2018-08-17 ENCOUNTER — Encounter (HOSPITAL_COMMUNITY): Payer: Self-pay

## 2018-08-17 NOTE — Telephone Encounter (Signed)
Offered visit 7/22. The patient's daughter declined, requesting a call to arrange a visit later in the year when the schedule is open. Will call to offer appointment at that time.

## 2018-08-19 ENCOUNTER — Encounter (HOSPITAL_COMMUNITY): Payer: Self-pay

## 2018-08-19 MED ORDER — METOPROLOL SUCCINATE ER 25 MG PO TB24: 25.0000 mg | ORAL_TABLET | Freq: Every day | ORAL | 3 refills | Status: DC

## 2018-08-19 MED ORDER — APIXABAN 5 MG PO TABS: 5.0000 mg | ORAL_TABLET | Freq: Two times a day (BID) | ORAL | 3 refills | Status: DC

## 2018-08-19 MED ORDER — HYDROCHLOROTHIAZIDE 12.5 MG PO CAPS: 12.5000 mg | ORAL_CAPSULE | Freq: Every day | ORAL | 3 refills | Status: DC

## 2018-08-19 MED ORDER — NITROGLYCERIN 0.4 MG SL SUBL: 0.4000 mg | SUBLINGUAL_TABLET | SUBLINGUAL | 3 refills | Status: AC | PRN

## 2018-08-19 NOTE — Telephone Encounter (Signed)
Left message to call back to arrange OV with Dr. Burt Knack in the fall as the schedule is now open.

## 2018-08-19 NOTE — Telephone Encounter (Signed)
Follow up ° °Patient's daughter is returning call. °

## 2018-08-19 NOTE — Telephone Encounter (Signed)
Scheduled patient for visit December 7 (the patient's daughter wishes to postpone appointment to latest possible due to [place]). Stacy [name] was grateful for call and agrees with treatment plan.

## 2018-08-19 NOTE — Addendum Note (Signed)
Addended by: Harland German A on: 08/19/2018 11:34 AM   Modules accepted: Orders

## 2018-08-21 ENCOUNTER — Encounter (HOSPITAL_COMMUNITY): Payer: Self-pay

## 2018-08-24 ENCOUNTER — Encounter (HOSPITAL_COMMUNITY): Payer: Self-pay

## 2018-08-26 ENCOUNTER — Encounter (HOSPITAL_COMMUNITY): Payer: Self-pay

## 2018-08-28 ENCOUNTER — Encounter (HOSPITAL_COMMUNITY): Payer: Self-pay

## 2018-08-31 ENCOUNTER — Encounter (HOSPITAL_COMMUNITY): Payer: Self-pay

## 2018-09-01 ENCOUNTER — Ambulatory Visit (INDEPENDENT_AMBULATORY_CARE_PROVIDER_SITE_OTHER): Payer: Medicare Other | Admitting: *Deleted

## 2018-09-01 DIAGNOSIS — I639 Cerebral infarction, unspecified: Secondary | ICD-10-CM | POA: Diagnosis not present

## 2018-09-01 LAB — CUP PACEART REMOTE DEVICE CHECK
Date Time Interrogation Session: 20200721193500
Implantable Pulse Generator Implant Date: 20180514

## 2018-09-02 ENCOUNTER — Encounter (HOSPITAL_COMMUNITY): Payer: Self-pay

## 2018-09-04 ENCOUNTER — Encounter (HOSPITAL_COMMUNITY): Payer: Self-pay

## 2018-09-07 ENCOUNTER — Encounter (HOSPITAL_COMMUNITY): Payer: Self-pay

## 2018-09-09 ENCOUNTER — Encounter (HOSPITAL_COMMUNITY): Payer: Self-pay

## 2018-09-11 ENCOUNTER — Encounter (HOSPITAL_COMMUNITY): Payer: Self-pay

## 2018-09-14 ENCOUNTER — Encounter (HOSPITAL_COMMUNITY): Payer: Self-pay

## 2018-09-16 ENCOUNTER — Encounter (HOSPITAL_COMMUNITY): Payer: Self-pay

## 2018-09-16 NOTE — Progress Notes (Signed)
Carelink Summary Report / Loop Recorder 

## 2018-09-18 ENCOUNTER — Encounter (HOSPITAL_COMMUNITY): Payer: Self-pay

## 2018-09-21 ENCOUNTER — Encounter (HOSPITAL_COMMUNITY): Payer: Self-pay

## 2018-09-23 ENCOUNTER — Encounter (HOSPITAL_COMMUNITY): Payer: Self-pay

## 2018-09-25 ENCOUNTER — Encounter (HOSPITAL_COMMUNITY): Payer: Self-pay

## 2018-09-28 ENCOUNTER — Encounter (HOSPITAL_COMMUNITY): Payer: Self-pay

## 2018-09-30 ENCOUNTER — Encounter (HOSPITAL_COMMUNITY): Payer: Self-pay

## 2018-10-02 ENCOUNTER — Encounter (HOSPITAL_COMMUNITY): Payer: Self-pay

## 2018-10-05 ENCOUNTER — Encounter (HOSPITAL_COMMUNITY): Payer: Self-pay

## 2018-10-05 ENCOUNTER — Ambulatory Visit (INDEPENDENT_AMBULATORY_CARE_PROVIDER_SITE_OTHER): Payer: Medicare Other | Admitting: *Deleted

## 2018-10-05 DIAGNOSIS — I6389 Other cerebral infarction: Secondary | ICD-10-CM | POA: Diagnosis not present

## 2018-10-05 DIAGNOSIS — I639 Cerebral infarction, unspecified: Secondary | ICD-10-CM

## 2018-10-06 LAB — CUP PACEART REMOTE DEVICE CHECK
Date Time Interrogation Session: 20200823200630
Implantable Pulse Generator Implant Date: 20180514

## 2018-10-07 ENCOUNTER — Encounter (HOSPITAL_COMMUNITY): Payer: Self-pay

## 2018-10-09 ENCOUNTER — Encounter (HOSPITAL_COMMUNITY): Payer: Self-pay

## 2018-10-12 ENCOUNTER — Encounter (HOSPITAL_COMMUNITY): Payer: Self-pay

## 2018-10-12 NOTE — Progress Notes (Signed)
Carelink Summary Report / Loop Recorder 

## 2018-10-14 ENCOUNTER — Encounter (HOSPITAL_COMMUNITY): Payer: Self-pay

## 2018-10-16 ENCOUNTER — Encounter (HOSPITAL_COMMUNITY): Payer: Self-pay

## 2018-10-21 ENCOUNTER — Encounter (HOSPITAL_COMMUNITY): Payer: Self-pay

## 2018-10-23 ENCOUNTER — Encounter (HOSPITAL_COMMUNITY): Payer: Self-pay

## 2018-10-26 ENCOUNTER — Encounter (HOSPITAL_COMMUNITY): Payer: Self-pay

## 2018-10-28 ENCOUNTER — Encounter (HOSPITAL_COMMUNITY): Payer: Self-pay

## 2018-10-30 ENCOUNTER — Encounter (HOSPITAL_COMMUNITY): Payer: Self-pay

## 2018-11-02 ENCOUNTER — Encounter (HOSPITAL_COMMUNITY): Payer: Self-pay

## 2018-11-04 ENCOUNTER — Encounter (HOSPITAL_COMMUNITY): Payer: Self-pay

## 2018-11-06 ENCOUNTER — Ambulatory Visit (INDEPENDENT_AMBULATORY_CARE_PROVIDER_SITE_OTHER): Payer: Medicare Other | Admitting: *Deleted

## 2018-11-06 ENCOUNTER — Encounter (HOSPITAL_COMMUNITY): Payer: Self-pay

## 2018-11-06 DIAGNOSIS — I639 Cerebral infarction, unspecified: Secondary | ICD-10-CM

## 2018-11-09 ENCOUNTER — Encounter (HOSPITAL_COMMUNITY): Payer: Self-pay

## 2018-11-10 LAB — CUP PACEART REMOTE DEVICE CHECK
Date Time Interrogation Session: 20200928211219
Implantable Pulse Generator Implant Date: 20180514

## 2018-11-11 ENCOUNTER — Encounter (HOSPITAL_COMMUNITY): Payer: Self-pay

## 2018-11-11 NOTE — Progress Notes (Signed)
Carelink Summary Report / Loop Recorder 

## 2018-11-27 DIAGNOSIS — Z23 Encounter for immunization: Secondary | ICD-10-CM | POA: Diagnosis not present

## 2018-12-13 LAB — CUP PACEART REMOTE DEVICE CHECK
Date Time Interrogation Session: 20201031210953
Implantable Pulse Generator Implant Date: 20180514

## 2018-12-14 ENCOUNTER — Ambulatory Visit (INDEPENDENT_AMBULATORY_CARE_PROVIDER_SITE_OTHER): Payer: Medicare Other | Admitting: *Deleted

## 2018-12-14 DIAGNOSIS — I480 Paroxysmal atrial fibrillation: Secondary | ICD-10-CM | POA: Diagnosis not present

## 2018-12-14 DIAGNOSIS — I639 Cerebral infarction, unspecified: Secondary | ICD-10-CM

## 2018-12-29 ENCOUNTER — Telehealth: Payer: Self-pay | Admitting: Emergency Medicine

## 2018-12-29 DIAGNOSIS — E782 Mixed hyperlipidemia: Secondary | ICD-10-CM | POA: Diagnosis not present

## 2018-12-29 DIAGNOSIS — E1159 Type 2 diabetes mellitus with other circulatory complications: Secondary | ICD-10-CM | POA: Diagnosis not present

## 2018-12-29 DIAGNOSIS — I10 Essential (primary) hypertension: Secondary | ICD-10-CM | POA: Diagnosis not present

## 2018-12-29 NOTE — Telephone Encounter (Signed)
Alert for AT/AF episode that is ongoing . Event started at 12/28/18 at 1333. + Eliquis Linq was implanted for stroke. Asymptomatic and has not missed any doses of Eliquis or metoprolol.

## 2018-12-29 NOTE — Telephone Encounter (Signed)
Dr Caryl Comes given report on patient and transmission reviewed . 1.  Remote transmission scheduled for 12/30/18 to determine if patient in AF.   2. If AF continues have patient check BP and if SBP > 130 then increase metoprolol to 50 mg daily and refer patient to AF clinic

## 2018-12-30 NOTE — Telephone Encounter (Signed)
LMOM for patient. Spoke with Rodena Piety (DPR). She reports that after the pt hung up yesterday and realized that she has been more weak and fatigued for the past few days. Had some heartburn yesterday that resolved with an antacid. No chest pain. Pt does not currently have a home BP cuff. Rodena Piety will purchase one for her and will track daily BPs to determine if metoprolol can be increased. She will call back with a few days of BPs. Rodena Piety is agreeable to AF Clinic f/u, pt previously saw Butch Penny, NP. ED precautions reviewed for worsening cardiac symptoms.   Message sent to AF Clinic scheduler for assistance setting up appointment.

## 2018-12-31 DIAGNOSIS — E1122 Type 2 diabetes mellitus with diabetic chronic kidney disease: Secondary | ICD-10-CM | POA: Diagnosis not present

## 2018-12-31 DIAGNOSIS — Z6826 Body mass index (BMI) 26.0-26.9, adult: Secondary | ICD-10-CM | POA: Diagnosis not present

## 2018-12-31 DIAGNOSIS — E782 Mixed hyperlipidemia: Secondary | ICD-10-CM | POA: Diagnosis not present

## 2018-12-31 DIAGNOSIS — H906 Mixed conductive and sensorineural hearing loss, bilateral: Secondary | ICD-10-CM | POA: Diagnosis not present

## 2018-12-31 DIAGNOSIS — N1831 Chronic kidney disease, stage 3a: Secondary | ICD-10-CM | POA: Diagnosis not present

## 2018-12-31 DIAGNOSIS — I69354 Hemiplegia and hemiparesis following cerebral infarction affecting left non-dominant side: Secondary | ICD-10-CM | POA: Diagnosis not present

## 2018-12-31 DIAGNOSIS — F324 Major depressive disorder, single episode, in partial remission: Secondary | ICD-10-CM | POA: Diagnosis not present

## 2018-12-31 DIAGNOSIS — Z1159 Encounter for screening for other viral diseases: Secondary | ICD-10-CM | POA: Diagnosis not present

## 2018-12-31 DIAGNOSIS — I10 Essential (primary) hypertension: Secondary | ICD-10-CM | POA: Diagnosis not present

## 2018-12-31 DIAGNOSIS — E1159 Type 2 diabetes mellitus with other circulatory complications: Secondary | ICD-10-CM | POA: Diagnosis not present

## 2018-12-31 NOTE — Telephone Encounter (Signed)
Called Rodena Piety DPR on file and was unable to leave VM as mailbox was full.  Spoke with patient and she will have [name] contact [facility]Fib [facility] to schedule appt.  Pt states her daughter, Rodena Piety, is her transportation.

## 2018-12-31 NOTE — Telephone Encounter (Signed)
Daughter, Rodena Piety, returned my call and is aware of appt 01/01/2019 @9 :25 with [facility].

## 2019-01-01 ENCOUNTER — Encounter (HOSPITAL_COMMUNITY): Payer: Self-pay | Admitting: Physician Assistant

## 2019-01-01 ENCOUNTER — Other Ambulatory Visit: Payer: Self-pay

## 2019-01-01 ENCOUNTER — Ambulatory Visit (HOSPITAL_COMMUNITY)
Admission: RE | Admit: 2019-01-01 | Discharge: 2019-01-01 | Disposition: A | Discharge status: Home / Self Care | Payer: Medicare Other | Source: Ambulatory Visit | Attending: Physician Assistant | Admitting: Physician Assistant

## 2019-01-01 VITALS — BP 146/68 | HR 58 | Ht 63.0 in | Wt 151.6 lb

## 2019-01-01 DIAGNOSIS — R011 Cardiac murmur, unspecified: Secondary | ICD-10-CM | POA: Insufficient documentation

## 2019-01-01 DIAGNOSIS — Z87891 Personal history of nicotine dependence: Secondary | ICD-10-CM | POA: Diagnosis not present

## 2019-01-01 DIAGNOSIS — F419 Anxiety disorder, unspecified: Secondary | ICD-10-CM | POA: Insufficient documentation

## 2019-01-01 DIAGNOSIS — Z79899 Other long term (current) drug therapy: Secondary | ICD-10-CM | POA: Diagnosis not present

## 2019-01-01 DIAGNOSIS — Z7901 Long term (current) use of anticoagulants: Secondary | ICD-10-CM | POA: Diagnosis not present

## 2019-01-01 DIAGNOSIS — I480 Paroxysmal atrial fibrillation: Secondary | ICD-10-CM | POA: Insufficient documentation

## 2019-01-01 DIAGNOSIS — I129 Hypertensive chronic kidney disease with stage 1 through stage 4 chronic kidney disease, or unspecified chronic kidney disease: Secondary | ICD-10-CM | POA: Insufficient documentation

## 2019-01-01 DIAGNOSIS — I252 Old myocardial infarction: Secondary | ICD-10-CM | POA: Insufficient documentation

## 2019-01-01 DIAGNOSIS — Z7984 Long term (current) use of oral hypoglycemic drugs: Secondary | ICD-10-CM | POA: Insufficient documentation

## 2019-01-01 DIAGNOSIS — E118 Type 2 diabetes mellitus with unspecified complications: Secondary | ICD-10-CM | POA: Diagnosis not present

## 2019-01-01 DIAGNOSIS — N183 Chronic kidney disease, stage 3 unspecified: Secondary | ICD-10-CM | POA: Insufficient documentation

## 2019-01-01 DIAGNOSIS — Z8673 Personal history of transient ischemic attack (TIA), and cerebral infarction without residual deficits: Secondary | ICD-10-CM | POA: Insufficient documentation

## 2019-01-01 DIAGNOSIS — E785 Hyperlipidemia, unspecified: Secondary | ICD-10-CM | POA: Diagnosis not present

## 2019-01-01 DIAGNOSIS — D6869 Other thrombophilia: Secondary | ICD-10-CM | POA: Diagnosis not present

## 2019-01-01 DIAGNOSIS — I2510 Atherosclerotic heart disease of native coronary artery without angina pectoris: Secondary | ICD-10-CM | POA: Insufficient documentation

## 2019-01-01 MED ORDER — METOPROLOL SUCCINATE ER 25 MG PO TB24: 25.0000 mg | ORAL_TABLET | Freq: Every day | ORAL | 3 refills | Status: DC

## 2019-01-01 NOTE — Progress Notes (Signed)
Primary Care Physician: Lillard Anes, MD Primary Cardiologist: Dr Burt Knack Primary Electrophysiologist: Dr Caryl Comes Referring Physician: Dr Canary Brim [name] a 85 y.o. female with a history of CVA, CAD, CKD, DM, HLD, HTN, paroxysmal atrial fibrillation who presents for follow up in the [facility].  The patient was initially diagnosed with atrial fibrillation on ILR which was implanted after a cryptogenic stroke in 2018. Patient has done very well with a low AF burden on metoprolol. Stacy [name] on Eliquis for a CHADS2VASC score of 8. On 12/29/18, the device clinic received an alert that Stacy had been in afib with RVR since 12/28/18. Patient initially stated Stacy was asymptomatic but in hindsight feels that Stacy had been more fatigued over the last few days. Today, patient reports that Stacy feels better with more energy. Stacy [name] back in SR. There were no triggers that the patient could identify.   Today, Stacy denies symptoms of palpitations, chest pain, shortness of breath, orthopnea, PND, lower extremity edema, dizziness, presyncope, syncope, snoring, daytime somnolence, bleeding, or neurologic sequela. The patient [name] tolerating medications without difficulties and [name] otherwise without complaint today.    Atrial Fibrillation Risk Factors:  Stacy does not have symptoms or diagnosis of sleep apnea. Stacy does not have a history of rheumatic fever.   Stacy has a BMI of Body mass index [name] 26.85 kg/m.Marland Kitchen Filed Weights   01/01/19 0914  Weight: 68.8 kg    Family History  Problem Relation Age of Onset  . Diabetes Father   . Prostate cancer Father        meta to colon  . Cancer Father        Prostate  . Parkinsonism Mother   . Stroke Paternal Uncle   . Hypertension Paternal Uncle   . Heart attack Neg Hx      Atrial Fibrillation Management history:  Previous antiarrhythmic drugs: none Previous cardioversions: none Previous ablations: none CHADS2VASC score:  8 Anticoagulation history: Eliquis   Past Medical History:  Diagnosis Date  . Anxiety   . Arthritis    Cervical DDD C5-6 and C6-7  . Atheroembolism   . Blue toe syndrome ([place])   . CAD (coronary artery disease)   . Cancer ([place])    skin BCC  Forehead and  SCC Left Lower Leg  . Carotid artery occlusion   . Chronic kidney disease, stage III (moderate)   . DDD (degenerative disc disease)   . Depression   . DM (diabetes mellitus) ([place])   . Gall stones   . Gallstones   . GERD (gastroesophageal reflux disease)   . Heart murmur   . Heart murmur   . Hemorrhoid   . Hemorrhoids   . Hiatal hernia   . History of shingles   . Hyperlipidemia   . Hypertension   . Leg pain   . Mild incontinence   . Multinodular goiter   . Multiple thyroid nodules    checked every year  . Myocardial infarction ([place]) 03/15/95  . Osteopenia   . Peptic ulcer disease   . Stenosis of right carotid artery   . Stomach cancer ([place]) 1980  . Stroke ([place])   . Thyroid nodule   . Vision abnormalities    Past Surgical History:  Procedure Laterality Date  . 3 epidural steroid injections    . APPENDECTOMY    . Basal Cell Cancer Removal     forehead  . CATARACT EXTRACTION    .  CATARACT EXTRACTION  12/29/2003  . CORONARY ANGIOPLASTY  03-15-1995  . CORONARY ANGIOPLASTY WITH STENT PLACEMENT  05-19-1998  . CORONARY STENT PLACEMENT  2000  . DILATION AND CURETTAGE OF UTERUS    . ENDARTERECTOMY Right 04/13/2015   Procedure: ENDARTERECTOMY CAROTID;  Surgeon: Serafina Mitchell, MD;  Location: [facility] OR;  Service: Vascular;  Laterality: Right;  . ENDOMETRIAL BIOPSY    . EYE SURGERY    . heart catherization    . HEMORRHOID SURGERY    . INCISIONAL HERNIA REPAIR    . INGUINAL HERNIA REPAIR    . LOOP RECORDER INSERTION N/A 06/24/2016   Procedure: Loop Recorder Insertion;  Surgeon: Deboraha Sprang, MD;  Location: [place] [facility];  Service: Cardiovascular;  Laterality: N/A;  . PATCH ANGIOPLASTY Right 04/13/2015   Procedure:  PATCH ANGIOPLASTY;  Surgeon: Serafina Mitchell, MD;  Location: [facility] OR;  Service: Vascular;  Laterality: Right;  . PTCA    . SQUAMOUS CELL CARCINOMA EXCISION     left lower leg  . TEE WITHOUT CARDIOVERSION N/A 06/24/2016   Procedure: TRANSESOPHAGEAL ECHOCARDIOGRAM (TEE);  Surgeon: Dorothy Spark, MD;  Location: [facility] ENDOSCOPY;  Service: Cardiovascular;  Laterality: N/A;  . TONSILLECTOMY      Current Outpatient Medications  Medication Sig Dispense Refill  . ACCU-CHEK SMARTVIEW test strip 1 each by Other route as needed for other (blood sugar test strips).   11  . acetaminophen (TYLENOL) 325 MG tablet Take 325 mg by mouth every 6 (six) hours as needed for mild pain or moderate pain.    Marland Kitchen apixaban (ELIQUIS) 5 MG TABS tablet Take 1 tablet (5 mg total) by mouth 2 (two) times daily. 180 tablet 3  . cholecalciferol (VITAMIN D) 400 UNITS TABS Take 400 Units by mouth daily.     . Coenzyme Q10 (COQ-10 PO) Take 50 mg by mouth daily. 1 tab po qd    . conjugated estrogens (PREMARIN) vaginal cream Place 1 g vaginally once a week. .625 mg once weekly    . diphenhydrAMINE (BENADRYL) 25 mg capsule Take 25 mg by mouth as needed for allergies.     Marland Kitchen docusate sodium (COLACE) 100 MG capsule Take 100 mg by mouth daily as needed.     . hydrochlorothiazide (MICROZIDE) 12.5 MG capsule Take 1 capsule (12.5 mg total) by mouth daily. 90 capsule 3  . hydrocortisone 2.5 % cream Apply 1 application topically daily as needed (for itching).   1  . Melatonin 5 MG CAPS Take 1 capsule by mouth at bedtime as needed (sleep).     . metFORMIN (GLUCOPHAGE) 500 MG tablet Take 500 mg by mouth 2 (two) times daily.    . metoprolol succinate (TOPROL-XL) 25 MG 24 hr tablet Take 1 tablet (25 mg total) by mouth daily. May take extra 1/2 tablet as needed for breakthrough 90 tablet 3  . Multiple Vitamins-Minerals (ICAPS AREDS 2 PO) Take by mouth.    . NEOMYCIN-POLYMYXIN-HYDROCORTISONE (CORTISPORIN) 1 % SOLN otic solution Place 2 drops into both  ears as directed.   3  . nitroGLYCERIN (NITROSTAT) 0.4 MG SL tablet Place 1 tablet (0.4 mg total) under the tongue every 5 (five) minutes as needed for chest pain. 25 tablet 3  . Polyethylene Glycol 400 (BLINK TEARS OP) Place 1 drop into both eyes daily as needed (for dry eye). Reported on 05/05/2015    . rosuvastatin (CRESTOR) 10 MG tablet Take 1 tablet (10 mg total) by mouth daily. Please make yearly appt with Dr.  Cooper for December for future refills. 1st attempt 90 tablet 1  . sertraline (ZOLOFT) 25 MG tablet Take 25 mg by mouth daily.     Marland Kitchen trolamine salicylate (ASPERCREME) 10 % cream Apply 1 application topically as needed for muscle pain.     No current facility-administered medications for this encounter.     Allergies  Allergen Reactions  . Iohexol Other (See Comments)     Desc: HIVES- 13 HR PRE-MEDS ARE REQUIRED-ASM- 03/21/05,SULFA,ADHESIVE TAPE   . Sulfonamide Derivatives Hives    REACTION: hives  . Latex Other (See Comments)    Adhesive tape and EKG adhesive leads to rash  . Tranilast Hives    Investigational medication  . Ivp Dye [Iodinated Diagnostic Agents]   . Tape     Adhesive tap    Social History   Socioeconomic History  . Marital status: Widowed    Spouse name: Not on file  . Number of children: 1  . Years of education: Not on file  . Highest education level: Not on file  Occupational History  . Occupation: Retired    Fish farm manager: RETIRED    Comment: [place]  . Financial resource strain: Not on file  . Food insecurity    Worry: Not on file    Inability: Not on file  . Transportation needs    Medical: Not on file    Non-medical: Not on file  Tobacco Use  . Smoking status: Former Smoker    Packs/day: 1.00    Years: 54.00    Pack years: 54.00    Types: Cigarettes    Quit date: 10/05/1998    Years since quitting: 20.2  . Smokeless tobacco: Never Used  Substance and Sexual Activity  . Alcohol use: No    Alcohol/week: 0.0 standard  drinks  . Drug use: No  . Sexual activity: Never  Lifestyle  . Physical activity    Days per week: Not on file    Minutes per session: Not on file  . Stress: Not on file  Relationships  . Social Herbalist on phone: Not on file    Gets together: Not on file    Attends religious service: Not on file    Active member of club or organization: Not on file    Attends meetings of clubs or organizations: Not on file    Relationship status: Not on file  . Intimate partner violence    Fear of current or ex partner: Not on file    Emotionally abused: Not on file    Physically abused: Not on file    Forced sexual activity: Not on file  Other Topics Concern  . Not on file  Social History Narrative  . Not on file     ROS- All systems are reviewed and negative except as per the HPI above.  Physical Exam: Vitals:   01/01/19 0914  BP: (!) 146/68  Pulse: (!) 58  Weight: 68.8 kg  Height: 5' 3"  (1.6 m)    GEN- The patient [name] well appearing elderly female, alert and oriented x 3 today.   Head- normocephalic, atraumatic Eyes-  Sclera clear, conjunctiva pink Ears- hearing intact Oropharynx- clear Neck- supple  Lungs- Clear to ausculation bilaterally, normal work of breathing Heart- Regular rate and rhythm, no murmurs, rubs or gallops  GI- soft, NT, ND, + BS Extremities- no clubbing, cyanosis, or edema MS- no significant deformity or atrophy Skin- no rash or lesion Psych- euthymic  mood, full affect Neuro- strength and sensation are intact  Wt Readings from Last 3 Encounters:  01/01/19 68.8 kg  02/24/18 68.9 kg  01/12/18 69.1 kg    EKG today demonstrates SB HR 58, PAC, RBBB, PR 174, QRS 128, QTc 441  Echo 06/23/16 demonstrated  - Left ventricle: There [name] a large apical and distal anterior   akinetic area. No definate thrombus [name] seen but if embolus   suspected may repeat with Definity. Virgie Dad [name] also a   consideration. The cavity size was normal. Wall thickness  was   increased in a pattern of mild LVH. Systolic function was mildly   to moderately reduced. The estimated ejection fraction was in the   range of 40% to 45%. Akinesis of the mid-apicalanterior and   apical myocardium. - Mitral valve: There was moderate regurgitation directed   posteriorly and toward the free wall. - Left atrium: The atrium was mildly dilated. - Tricuspid valve: There was moderate regurgitation. - Pulmonary arteries: Systolic pressure was mildly increased. PA   peak pressure: 40 mm Hg (S).  Epic records are reviewed at length today  Labs from PCP 12/30/18 WBC 10.8, Hgb 14.8, Hct 44.4, PLT 229 BUN 15, Cr 1.18, K+4.3, AST 28, ALT 23 Hgb A1c 7.6  Assessment and Plan:  1. Paroxysmal atrial fibrillation Patient spontaneously converted to SR with symptomatic improvement.  Continue Eliquis 5 mg BID Continue Toprol 25 mg daily. Will not plan to increase daily dose given resting bradycardia. We did discuss taking an extra 12.5 mg dose for heart racing >100 bpm. Patient has a new BP machine.  This patients CHA2DS2-VASc Score and unadjusted Ischemic Stroke Rate (% per year) [name] equal to 10.8 % stroke rate/year from a score of 8  Above score calculated as 1 point each if present [CHF, HTN, DM, Vascular=MI/PAD/Aortic Plaque, Age if 65-74, or Female] Above score calculated as 2 points each if present [Age > 75, or Stroke/TIA/TE]   2. CAD S/p PCI in LAD and RCA. No anginal symptoms. Followed by Dr Burt Knack.  3. HTN Stable, no changes today.   Follow up in the AF clinic in 3 months. Dr Burt Knack as scheduled.   [facility] [address] [place], [place] [address] [phone] 01/01/2019 9:44 AM

## 2019-01-01 NOTE — Patient Instructions (Signed)
May take extra 1/2 tablet of metoprolol for breakthrough afib.

## 2019-01-11 NOTE — Progress Notes (Signed)
Carelink Summary Report / Loop Recorder 

## 2019-01-14 ENCOUNTER — Ambulatory Visit (INDEPENDENT_AMBULATORY_CARE_PROVIDER_SITE_OTHER): Payer: Medicare Other | Admitting: *Deleted

## 2019-01-14 DIAGNOSIS — I639 Cerebral infarction, unspecified: Secondary | ICD-10-CM | POA: Diagnosis not present

## 2019-01-14 LAB — CUP PACEART REMOTE DEVICE CHECK
Date Time Interrogation Session: 20201203161036
Implantable Pulse Generator Implant Date: 20180514

## 2019-01-18 ENCOUNTER — Encounter: Payer: Self-pay | Admitting: Cardiovascular Disease

## 2019-01-18 ENCOUNTER — Other Ambulatory Visit: Payer: Self-pay

## 2019-01-18 ENCOUNTER — Ambulatory Visit (INDEPENDENT_AMBULATORY_CARE_PROVIDER_SITE_OTHER): Payer: Medicare Other | Admitting: Cardiovascular Disease

## 2019-01-18 VITALS — BP 104/68 | HR 57 | Ht 63.0 in | Wt 152.8 lb

## 2019-01-18 DIAGNOSIS — E782 Mixed hyperlipidemia: Secondary | ICD-10-CM | POA: Diagnosis not present

## 2019-01-18 DIAGNOSIS — I639 Cerebral infarction, unspecified: Secondary | ICD-10-CM | POA: Diagnosis not present

## 2019-01-18 DIAGNOSIS — I2510 Atherosclerotic heart disease of native coronary artery without angina pectoris: Secondary | ICD-10-CM

## 2019-01-18 DIAGNOSIS — I10 Essential (primary) hypertension: Secondary | ICD-10-CM

## 2019-01-18 DIAGNOSIS — I480 Paroxysmal atrial fibrillation: Secondary | ICD-10-CM

## 2019-01-18 NOTE — Progress Notes (Signed)
Cardiology Office Note:    Date:  01/18/2019   ID:  Stacy [name], DOB 1926/03/13, MRN [id number]  PCP:  Lillard Anes, MD  Cardiologist:  Sherren Mocha, MD  Electrophysiologist:  None   Referring MD: Lillard Anes,*   Chief Complaint  Patient presents with   Coronary Artery Disease    History of Present Illness:    Stacy [name] is a 85 y.o. female with a hx of coronary artery disease and stroke, presenting for follow-up evaluation.  The patient initially presented in 1997 with an anterior wall MI treated with balloon angioplasty of the LAD.  She later underwent stenting of the RCA in 2000.  Her most recent heart catheterization in 2008 demonstrated patency of her angioplasty and stent sites in the LAD and RCA, respectively.  The patient had a stroke in 2018.  A loop recorder demonstrated paroxysmal atrial fibrillation from which the patient has not had any associated symptoms.  She is now anticoagulated with apixaban.  She is here with her daughter today.  She had an episode of atrial fibrillation last month that was noted on her device interrogation.  She was seen in the atrial fibrillation clinic but she was back in sinus rhythm by then.  She seems to be doing just fine.  She has had no heart palpitations.  She may have had some fatigue and shortness of breath when she was in atrial fibrillation, but she is feeling better now.  She has had some problems with heartburn but this is improved with medication.  She denies orthopnea, PND, or leg swelling.  The COVID-19 pandemic has been tough on her and she really has not been out of her house much at all.  Past Medical History:  Diagnosis Date   Anxiety    Arthritis    Cervical DDD C5-6 and C6-7   Atheroembolism    Blue toe syndrome (HCC)    CAD (coronary artery disease)    Cancer (HCC)    skin BCC  Forehead and  SCC Left Lower Leg   Carotid artery occlusion    Chronic kidney disease, stage III (moderate)     DDD (degenerative disc disease)    Depression    DM (diabetes mellitus) ([place])    Gall stones    Gallstones    GERD (gastroesophageal reflux disease)    Heart murmur    Heart murmur    Hemorrhoid    Hemorrhoids    Hiatal hernia    History of shingles    Hyperlipidemia    Hypertension    Leg pain    Mild incontinence    Multinodular goiter    Multiple thyroid nodules    checked every year   Myocardial infarction ([place]) 03/15/95   Osteopenia    Peptic ulcer disease    Stenosis of right carotid artery    Stomach cancer ([place]) 1980   Stroke ([place])    Thyroid nodule    Vision abnormalities     Past Surgical History:  Procedure Laterality Date   3 epidural steroid injections     APPENDECTOMY     Basal Cell Cancer Removal     forehead   CATARACT EXTRACTION     CATARACT EXTRACTION  12/29/2003   CORONARY ANGIOPLASTY  03-15-1995   CORONARY ANGIOPLASTY WITH STENT PLACEMENT  05-19-1998   CORONARY STENT PLACEMENT  2000   DILATION AND CURETTAGE OF UTERUS     ENDARTERECTOMY Right 04/13/2015   Procedure: ENDARTERECTOMY CAROTID;  Surgeon: Serafina Mitchell, MD;  Location: [facility];  Service: Vascular;  Laterality: Right;   ENDOMETRIAL BIOPSY     EYE SURGERY     heart catherization     HEMORRHOID SURGERY     INCISIONAL HERNIA REPAIR     INGUINAL HERNIA REPAIR     LOOP RECORDER INSERTION N/A 06/24/2016   Procedure: Loop Recorder Insertion;  Surgeon: Deboraha Sprang, MD;  Location: [facility];  Service: Cardiovascular;  Laterality: N/A;   PATCH ANGIOPLASTY Right 04/13/2015   Procedure: PATCH ANGIOPLASTY;  Surgeon: Serafina Mitchell, MD;  Location: [facility] OR;  Service: Vascular;  Laterality: Right;   PTCA     SQUAMOUS CELL CARCINOMA EXCISION     left lower leg   TEE WITHOUT CARDIOVERSION N/A 06/24/2016   Procedure: TRANSESOPHAGEAL ECHOCARDIOGRAM (TEE);  Surgeon: Dorothy Spark, MD;  Location: [facility] ENDOSCOPY;  Service: Cardiovascular;  Laterality:  N/A;   TONSILLECTOMY      Current Medications: Current Meds  Medication Sig   ACCU-CHEK SMARTVIEW test strip 1 each by Other route as needed for other (blood sugar test strips).    acetaminophen (TYLENOL) 325 MG tablet Take 325 mg by mouth every 6 (six) hours as needed for mild pain or moderate pain.   apixaban (ELIQUIS) 5 MG TABS tablet Take 1 tablet (5 mg total) by mouth 2 (two) times daily.   Cholecalciferol (VITAMIN D-3) 25 MCG (1000 UT) CAPS Take 1,000 Units by mouth 2 (two) times daily.   conjugated estrogens (PREMARIN) vaginal cream Place 1 g vaginally once a week. .625 mg once weekly   diphenhydrAMINE (BENADRYL) 25 mg capsule Take 25 mg by mouth as needed for allergies.    docusate sodium (COLACE) 100 MG capsule Take 100 mg by mouth daily as needed.    hydrochlorothiazide (MICROZIDE) 12.5 MG capsule Take 1 capsule (12.5 mg total) by mouth daily.   hydrocortisone 2.5 % cream Apply 1 application topically daily as needed (for itching).    Melatonin 10 MG TABS Take 10 mg by mouth as needed.   metFORMIN (GLUCOPHAGE) 500 MG tablet Take 500 mg by mouth 2 (two) times daily. 250MG  IN AM; 500MG  IN PM   metoprolol succinate (TOPROL-XL) 25 MG 24 hr tablet Take 1 tablet (25 mg total) by mouth daily. May take extra 1/2 tablet as needed for breakthrough   NEOMYCIN-POLYMYXIN-HYDROCORTISONE (CORTISPORIN) 1 % SOLN otic solution Place 2 drops into both ears as directed.    nitroGLYCERIN (NITROSTAT) 0.4 MG SL tablet Place 1 tablet (0.4 mg total) under the tongue every 5 (five) minutes as needed for chest pain.   rosuvastatin (CRESTOR) 10 MG tablet Take 1 tablet (10 mg total) by mouth daily. Please make yearly appt with Dr. Burt Knack for December for future refills. 1st attempt   sertraline (ZOLOFT) 50 MG tablet Take 50 mg by mouth daily.   trolamine salicylate (ASPERCREME) 10 % cream Apply 1 application topically as needed for muscle pain.   [DISCONTINUED] sertraline (ZOLOFT) 25 MG  tablet Take 25 mg by mouth daily.      Allergies:   Iohexol, Sulfonamide derivatives, Latex, Tranilast, Ivp dye [iodinated diagnostic agents], and Tape   Social History   Socioeconomic History   Marital status: Widowed    Spouse name: Not on file   Number of children: 1   Years of education: Not on file   Highest education level: Not on file  Occupational History   Occupation: Retired    Fish farm manager: RETIRED  Comment: Dietitian strain: Not on file   Food insecurity    Worry: Not on file    Inability: Not on file   Transportation needs    Medical: Not on file    Non-medical: Not on file  Tobacco Use   Smoking status: Former Smoker    Packs/day: 1.00    Years: 54.00    Pack years: 54.00    Types: Cigarettes    Quit date: 10/05/1998    Years since quitting: 20.3   Smokeless tobacco: Never Used  Substance and Sexual Activity   Alcohol use: No    Alcohol/week: 0.0 standard drinks   Drug use: No   Sexual activity: Never  Lifestyle   Physical activity    Days per week: Not on file    Minutes per session: Not on file   Stress: Not on file  Relationships   Social connections    Talks on phone: Not on file    Gets together: Not on file    Attends religious service: Not on file    Active member of club or organization: Not on file    Attends meetings of clubs or organizations: Not on file    Relationship status: Not on file  Other Topics Concern   Not on file  Social History Narrative   Not on file    Family History: The patient's family history includes Cancer in her father; Diabetes in her father; Hypertension in her paternal uncle; Parkinsonism in her mother; Prostate cancer in her father; Stroke in her paternal uncle. There is no history of Heart attack.  ROS:   Please see the history of present illness.    All other systems reviewed and are negative.  EKGs/Labs/Other Studies Reviewed:    EKG:  EKG is  not ordered today.   Recent Labs: 03/04/2018: BUN 20; Creatinine, Ser 1.23; Hemoglobin 15.0; Platelets 236; Potassium 4.3; Sodium 139  Recent Lipid Panel    Component Value Date/Time   CHOL 102 06/23/2016 0441   TRIG 116 06/23/2016 0441   HDL 42 06/23/2016 0441   CHOLHDL 2.4 06/23/2016 0441   VLDL 23 06/23/2016 0441   LDLCALC 37 06/23/2016 0441    Physical Exam:    VS:  BP 104/68    Pulse (!) 57    Ht 5\' 3"  (1.6 m)    Wt 152 lb 12.8 oz (69.3 kg)    SpO2 95%    BMI 27.07 kg/m     Wt Readings from Last 3 Encounters:  01/18/19 152 lb 12.8 oz (69.3 kg)  01/01/19 151 lb 9.6 oz (68.8 kg)  02/24/18 152 lb (68.9 kg)     GEN:  Well nourished, well developed in no acute distress HEENT: Normal NECK: No JVD; No carotid bruits LYMPHATICS: No lymphadenopathy CARDIAC: RRR, no murmurs, rubs, gallops RESPIRATORY:  Clear to auscultation without rales, wheezing or rhonchi  ABDOMEN: Soft, non-tender, non-distended MUSCULOSKELETAL:  No edema; No deformity  SKIN: Warm and dry NEUROLOGIC:  Alert and oriented x 3 PSYCHIATRIC:  Normal affect   ASSESSMENT:    1. Coronary artery disease involving native coronary artery of native heart without angina pectoris   2. Paroxysmal atrial fibrillation (HCC)   3. Essential hypertension   4. Mixed hyperlipidemia    PLAN:    In order of problems listed above:  1. The patient is doing well.  She is not on antiplatelet therapy because of chronic oral anticoagulation.  She  is having no anginal symptoms at present. 2. She did have an episode of atrial fibrillation.  She has bradycardic at rest and she continues on a low-dose of metoprolol succinate.  She understands that she can take an additional dose if she has elevated heart rates.  She has been monitoring her blood pressure at home.  She continues on apixaban for anticoagulation.  She does not meet dose reduction criteria at this time. 3. Blood pressure is well controlled on current medical therapy with  hydrochlorothiazide and metoprolol succinate. 4. Lipids are excellent on rosuvastatin.  Total cholesterol 116, HDL 50, LDL 61 mg/dL.     Medication Adjustments/Labs and Tests Ordered: Current medicines are reviewed at length with the patient today.  Concerns regarding medicines are outlined above.  No orders of the defined types were placed in this encounter.  No orders of the defined types were placed in this encounter.   Patient Instructions  Medication Instructions:  Your provider recommends that you continue on your current medications as directed. Please refer to the Current Medication list given to you today.   *If you need a refill on your cardiac medications before your next appointment, please call your pharmacy*  Follow-Up: At [facility], you and your health needs are our priority.  As part of our continuing mission to provide you with exceptional heart care, we have created designated Provider Care Teams.  These Care Teams include your primary Cardiologist (physician) and Advanced Practice Providers (APPs -  Physician Assistants and Nurse Practitioners) who all work together to provide you with the care you need, when you need it. Your next appointment:   6 month(s) The format for your next appointment:   In Person Provider:   You may see Sherren Mocha, MD or one of the following Advanced Practice Providers on your designated Care Team:    Richardson Dopp, PA-C  Vin Moffat, PA-C  Daune Perch, [place]     Signed, Sherren Mocha, MD  01/18/2019 5:04 PM    [place]

## 2019-01-18 NOTE — Patient Instructions (Signed)
Medication Instructions:  Your provider recommends that you continue on your current medications as directed. Please refer to the Current Medication list given to you today.   *If you need a refill on your cardiac medications before your next appointment, please call your pharmacy*   Follow-Up: At [facility], you and your health needs are our priority.  As part of our continuing mission to provide you with exceptional heart care, we have created designated Provider Care Teams.  These Care Teams include your primary Cardiologist (physician) and Advanced Practice Providers (APPs -  Physician Assistants and Nurse Practitioners) who all work together to provide you with the care you need, when you need it. Your next appointment:   6 month(s) The format for your next appointment:   In Person Provider:   You may see Michael Cooper, MD or one of the following Advanced Practice Providers on your designated Care Team:    Scott Weaver, PA-C  Vin Bhagat, PA-C  Janine Hammond, NP   

## 2019-01-27 ENCOUNTER — Other Ambulatory Visit: Payer: Self-pay | Admitting: Cardiovascular Disease

## 2019-01-27 DIAGNOSIS — E785 Hyperlipidemia, unspecified: Secondary | ICD-10-CM

## 2019-01-27 DIAGNOSIS — I2510 Atherosclerotic heart disease of native coronary artery without angina pectoris: Secondary | ICD-10-CM

## 2019-02-09 NOTE — Progress Notes (Signed)
ILR remote 

## 2019-02-15 ENCOUNTER — Ambulatory Visit (INDEPENDENT_AMBULATORY_CARE_PROVIDER_SITE_OTHER): Payer: Medicare Other | Admitting: *Deleted

## 2019-02-15 DIAGNOSIS — I639 Cerebral infarction, unspecified: Secondary | ICD-10-CM

## 2019-02-15 LAB — CUP PACEART REMOTE DEVICE CHECK
Date Time Interrogation Session: 20210103231837
Implantable Pulse Generator Implant Date: 20180514

## 2019-03-03 ENCOUNTER — Ambulatory Visit: Payer: Medicare Other | Attending: Internal Medicine

## 2019-03-03 DIAGNOSIS — Z23 Encounter for immunization: Secondary | ICD-10-CM | POA: Diagnosis not present

## 2019-03-03 NOTE — Progress Notes (Signed)
   Covid-19 Vaccination Clinic  Name:  Stacy [name]    MRN: [id number] DOB: 1926-09-06  03/03/2019  Stacy [name] was observed post Covid-19 immunization for 15 minutes without incidence. She was provided with Vaccine Information Sheet and instruction to access the V-Safe system.   Stacy [name] was instructed to call 911 with any severe reactions post vaccine: Marland Kitchen Difficulty breathing  . Swelling of your face and throat  . A fast heartbeat  . A bad rash all over your body  . Dizziness and weakness    Immunizations Administered    Name Date Dose VIS Date Route   Pfizer COVID-19 Vaccine 03/03/2019  3:31 PM 0.3 mL 01/22/2019 Intramuscular   Manufacturer: [place]   Lot: [id number]   [place]: [id number]

## 2019-03-08 DIAGNOSIS — M25561 Pain in right knee: Secondary | ICD-10-CM | POA: Diagnosis not present

## 2019-03-08 DIAGNOSIS — M25562 Pain in left knee: Secondary | ICD-10-CM | POA: Diagnosis not present

## 2019-03-08 DIAGNOSIS — M170 Bilateral primary osteoarthritis of knee: Secondary | ICD-10-CM | POA: Diagnosis not present

## 2019-03-18 ENCOUNTER — Ambulatory Visit (INDEPENDENT_AMBULATORY_CARE_PROVIDER_SITE_OTHER): Payer: Medicare Other | Admitting: *Deleted

## 2019-03-18 DIAGNOSIS — I639 Cerebral infarction, unspecified: Secondary | ICD-10-CM

## 2019-03-18 LAB — CUP PACEART REMOTE DEVICE CHECK
Date Time Interrogation Session: 20210203233338
Implantable Pulse Generator Implant Date: 20180513200000

## 2019-03-18 NOTE — Progress Notes (Signed)
ILR Remote 

## 2019-03-24 ENCOUNTER — Ambulatory Visit: Payer: Medicare Other | Attending: Internal Medicine

## 2019-03-24 DIAGNOSIS — Z23 Encounter for immunization: Secondary | ICD-10-CM

## 2019-03-24 NOTE — Progress Notes (Signed)
   Covid-19 Vaccination Clinic  Name:  Stacy [name]    MRN: [id number] DOB: 1926/07/24  03/24/2019  Stacy [name] was observed post Covid-19 immunization for 15 minutes without incidence. She was provided with Vaccine Information Sheet and instruction to access the V-Safe system.   Stacy [name] was instructed to call 911 with any severe reactions post vaccine: Marland Kitchen Difficulty breathing  . Swelling of your face and throat  . A fast heartbeat  . A bad rash all over your body  . Dizziness and weakness    Immunizations Administered    Name Date Dose VIS Date Route   Pfizer COVID-19 Vaccine 03/24/2019  2:24 PM 0.3 mL 01/22/2019 Intramuscular   Manufacturer: [place]   Lot: [id number]   [place]: [id number]

## 2019-03-26 ENCOUNTER — Other Ambulatory Visit: Payer: Self-pay | Admitting: Legal Medicine

## 2019-04-05 ENCOUNTER — Ambulatory Visit (HOSPITAL_COMMUNITY): Payer: Medicare Other | Admitting: Physician Assistant

## 2019-04-06 ENCOUNTER — Other Ambulatory Visit: Payer: Medicare Other

## 2019-04-07 ENCOUNTER — Encounter (HOSPITAL_COMMUNITY): Payer: Medicare Other

## 2019-04-07 ENCOUNTER — Ambulatory Visit: Payer: Medicare Other

## 2019-04-08 ENCOUNTER — Ambulatory Visit: Payer: Medicare Other | Admitting: Legal Medicine

## 2019-04-13 ENCOUNTER — Other Ambulatory Visit: Payer: Medicare Other

## 2019-04-14 ENCOUNTER — Other Ambulatory Visit: Payer: Self-pay

## 2019-04-14 DIAGNOSIS — N182 Chronic kidney disease, stage 2 (mild): Secondary | ICD-10-CM

## 2019-04-14 DIAGNOSIS — E782 Mixed hyperlipidemia: Secondary | ICD-10-CM

## 2019-04-14 DIAGNOSIS — I10 Essential (primary) hypertension: Secondary | ICD-10-CM

## 2019-04-15 ENCOUNTER — Ambulatory Visit: Payer: Medicare Other | Admitting: Legal Medicine

## 2019-04-19 ENCOUNTER — Ambulatory Visit (INDEPENDENT_AMBULATORY_CARE_PROVIDER_SITE_OTHER): Payer: Medicare Other | Admitting: *Deleted

## 2019-04-19 ENCOUNTER — Other Ambulatory Visit: Payer: Self-pay

## 2019-04-19 ENCOUNTER — Other Ambulatory Visit: Payer: Medicare Other

## 2019-04-19 DIAGNOSIS — N182 Chronic kidney disease, stage 2 (mild): Secondary | ICD-10-CM | POA: Diagnosis not present

## 2019-04-19 DIAGNOSIS — E782 Mixed hyperlipidemia: Secondary | ICD-10-CM | POA: Diagnosis not present

## 2019-04-19 DIAGNOSIS — I639 Cerebral infarction, unspecified: Secondary | ICD-10-CM

## 2019-04-19 DIAGNOSIS — I10 Essential (primary) hypertension: Secondary | ICD-10-CM | POA: Diagnosis not present

## 2019-04-19 LAB — CUP PACEART REMOTE DEVICE CHECK
Date Time Interrogation Session: 20210308023315
Implantable Pulse Generator Implant Date: 20180514

## 2019-04-20 DIAGNOSIS — E1159 Type 2 diabetes mellitus with other circulatory complications: Secondary | ICD-10-CM | POA: Insufficient documentation

## 2019-04-20 DIAGNOSIS — D510 Vitamin B12 deficiency anemia due to intrinsic factor deficiency: Secondary | ICD-10-CM | POA: Insufficient documentation

## 2019-04-20 DIAGNOSIS — E669 Obesity, unspecified: Secondary | ICD-10-CM | POA: Insufficient documentation

## 2019-04-20 DIAGNOSIS — H353130 Nonexudative age-related macular degeneration, bilateral, stage unspecified: Secondary | ICD-10-CM | POA: Insufficient documentation

## 2019-04-20 DIAGNOSIS — I69354 Hemiplegia and hemiparesis following cerebral infarction affecting left non-dominant side: Secondary | ICD-10-CM | POA: Insufficient documentation

## 2019-04-20 DIAGNOSIS — N1831 Chronic kidney disease, stage 3a: Secondary | ICD-10-CM | POA: Insufficient documentation

## 2019-04-20 DIAGNOSIS — E1121 Type 2 diabetes mellitus with diabetic nephropathy: Secondary | ICD-10-CM | POA: Insufficient documentation

## 2019-04-20 DIAGNOSIS — H906 Mixed conductive and sensorineural hearing loss, bilateral: Secondary | ICD-10-CM | POA: Insufficient documentation

## 2019-04-20 NOTE — Progress Notes (Signed)
ILR Remote 

## 2019-04-21 ENCOUNTER — Telehealth: Payer: Self-pay | Admitting: *Deleted

## 2019-04-21 ENCOUNTER — Encounter: Payer: Self-pay | Admitting: Legal Medicine

## 2019-04-21 ENCOUNTER — Ambulatory Visit (INDEPENDENT_AMBULATORY_CARE_PROVIDER_SITE_OTHER): Payer: Medicare Other | Admitting: Legal Medicine

## 2019-04-21 ENCOUNTER — Other Ambulatory Visit: Payer: Self-pay

## 2019-04-21 VITALS — BP 140/70 | HR 74 | Temp 98.2°F | Resp 17 | Ht 63.0 in | Wt 153.6 lb

## 2019-04-21 DIAGNOSIS — D510 Vitamin B12 deficiency anemia due to intrinsic factor deficiency: Secondary | ICD-10-CM | POA: Diagnosis not present

## 2019-04-21 DIAGNOSIS — E1159 Type 2 diabetes mellitus with other circulatory complications: Secondary | ICD-10-CM

## 2019-04-21 DIAGNOSIS — E1169 Type 2 diabetes mellitus with other specified complication: Secondary | ICD-10-CM | POA: Diagnosis not present

## 2019-04-21 DIAGNOSIS — E1121 Type 2 diabetes mellitus with diabetic nephropathy: Secondary | ICD-10-CM

## 2019-04-21 DIAGNOSIS — N1831 Chronic kidney disease, stage 3a: Secondary | ICD-10-CM

## 2019-04-21 DIAGNOSIS — E669 Obesity, unspecified: Secondary | ICD-10-CM | POA: Diagnosis not present

## 2019-04-21 DIAGNOSIS — H906 Mixed conductive and sensorineural hearing loss, bilateral: Secondary | ICD-10-CM | POA: Diagnosis not present

## 2019-04-21 DIAGNOSIS — I69354 Hemiplegia and hemiparesis following cerebral infarction affecting left non-dominant side: Secondary | ICD-10-CM

## 2019-04-21 DIAGNOSIS — H353132 Nonexudative age-related macular degeneration, bilateral, intermediate dry stage: Secondary | ICD-10-CM | POA: Diagnosis not present

## 2019-04-21 DIAGNOSIS — I10 Essential (primary) hypertension: Secondary | ICD-10-CM

## 2019-04-21 LAB — POCT UA - MICROALBUMIN: Microalbumin Ur, POC: 10 mg/L

## 2019-04-21 LAB — LIPID PANEL W/O CHOL/HDL RATIO
Cholesterol, Total: 125 mg/dL (ref 100–199)
HDL: 50 mg/dL (ref >39)
LDL Chol Calc (NIH): 53 mg/dL (ref 0–99)
Triglycerides: 125 mg/dL (ref 0–149)
VLDL Cholesterol Cal: 22 mg/dL (ref 5–40)

## 2019-04-21 LAB — COMPREHENSIVE METABOLIC PANEL
ALT: 28 IU/L (ref 0–32)
AST: 38 IU/L (ref 0–40)
Albumin/Globulin Ratio: 2.1 (ref 1.2–2.2)
Albumin: 4.1 g/dL (ref 3.5–4.6)
Alkaline Phosphatase: 64 IU/L (ref 39–117)
BUN/Creatinine Ratio: 11 — ABNORMAL LOW (ref 12–28)
BUN: 12 mg/dL (ref 10–36)
Bilirubin Total: 1 mg/dL (ref 0.0–1.2)
CO2: 22 mmol/L (ref 20–29)
Calcium: 9.6 mg/dL (ref 8.7–10.3)
Chloride: 95 mmol/L — ABNORMAL LOW (ref 96–106)
Creatinine, Ser: 1.09 mg/dL — ABNORMAL HIGH (ref 0.57–1.00)
GFR calc Af Amer: 51 mL/min/{1.73_m2} — ABNORMAL LOW (ref >59)
GFR calc non Af Amer: 44 mL/min/{1.73_m2} — ABNORMAL LOW (ref >59)
Globulin, Total: 2 g/dL (ref 1.5–4.5)
Glucose: 144 mg/dL — ABNORMAL HIGH (ref 65–99)
Potassium: 3.9 mmol/L (ref 3.5–5.2)
Sodium: 134 mmol/L (ref 134–144)
Total Protein: 6.1 g/dL (ref 6.0–8.5)

## 2019-04-21 LAB — CBC WITH DIFFERENTIAL/PLATELET
Basophils Absolute: 0.1 10*3/uL (ref 0.0–0.2)
Basos: 1 %
EOS (ABSOLUTE): 0.3 10*3/uL (ref 0.0–0.4)
Eos: 3 %
Hematocrit: 43 % (ref 34.0–46.6)
Hemoglobin: 14.4 g/dL (ref 11.1–15.9)
Immature Grans (Abs): 0 10*3/uL (ref 0.0–0.1)
Immature Granulocytes: 0 %
Lymphocytes Absolute: 3 10*3/uL (ref 0.7–3.1)
Lymphs: 37 %
MCH: 28.7 pg (ref 26.6–33.0)
MCHC: 33.5 g/dL (ref 31.5–35.7)
MCV: 86 fL (ref 79–97)
Monocytes Absolute: 0.7 10*3/uL (ref 0.1–0.9)
Monocytes: 9 %
Neutrophils Absolute: 4.1 10*3/uL (ref 1.4–7.0)
Neutrophils: 50 %
Platelets: 196 10*3/uL (ref 150–450)
RBC: 5.02 x10E6/uL (ref 3.77–5.28)
RDW: 13.7 % (ref 11.7–15.4)
WBC: 8.2 10*3/uL (ref 3.4–10.8)

## 2019-04-21 LAB — CARDIOVASCULAR RISK ASSESSMENT

## 2019-04-21 LAB — HGB A1C W/O EAG: Hgb A1c MFr Bld: 7.8 % — ABNORMAL HIGH (ref 4.8–5.6)

## 2019-04-21 NOTE — Telephone Encounter (Signed)
LMOVM at patient's home number and at daughter's cell number requesting call back to DC. Direct number provided.

## 2019-04-21 NOTE — Assessment & Plan Note (Signed)
An individual care plan was established and reinforced today.  The patient's status was assessed using clinical findings on exam, labs and diagnostic testing. Patient success at meeting goals based on disease specific evidence-based guidelines and found to be good controlled. Medications were assessed and patient's understanding of the medical issues , including barriers were assessed. Recommend adherence to a diabetic diet, a graduated exercise program, HgbA1c level is checked quarterly, and urine microalbumin performed yearly .  Annual mono-filament sensation testing performed. Lower blood pressure and control hyperlipidemia is important. Get annual eye exams and annual flu shots and smoking cessation discussed.  Self management goals were discussed. 

## 2019-04-21 NOTE — Assessment & Plan Note (Signed)
AN INDIVIDUAL CARE PLAN was established and reinforced today.  The patient's status was assessed using clinical findings on exam, labs, and other diagnostic testing. Patient's success at meeting treatment goals based on disease specific evidence-bassed guidelines and found to be in good control. RECOMMENDATIONS include maintaining present medicines and treatment. 

## 2019-04-21 NOTE — Assessment & Plan Note (Signed)
An individual care plan was established and reinforced today.  The patient's status was assessed using clinical findings on exam and labs or diagnostic tests. The patient's success at meeting treatment goals on disease specific evidence-based guidelines and found to be well controlled. SELF MANAGEMENT: The patient and I together assessed ways to personally work towards obtaining the recommended goals. RECOMMENDATIONS: avoid decongestants found in common cold remedies, decrease consumption of alcohol, perform routine monitoring of BP with home BP cuff, exercise, reduction of dietary salt, take medicines as prescribed, try not to miss doses and quit smoking.  Regular exercise and maintaining a healthy weight is needed.  Stress reduction may help. A CLINICAL SUMMARY including written plan identify barriers to care unique to individual due to social or financial issues.  We attempt to mutually creat solutions for individual and family understanding. 

## 2019-04-21 NOTE — Assessment & Plan Note (Signed)
Patient continues on vitamin D regularly.

## 2019-04-21 NOTE — Telephone Encounter (Signed)
LINQ alert received for tachy episode on 04/20/19 at 09:58, ECG appears 17 beats NSVT in the 130s.  LMOVM requesting call back from patient. Direct DC phone number provided. Will request manual transmission for review of any additional tachy episodes and discuss symptoms.

## 2019-04-21 NOTE — Progress Notes (Signed)
Cholesterol normal, A1c up 7.8, glucose 144, kidney tests stable, liver tests normal, no anemia lp

## 2019-04-21 NOTE — Progress Notes (Signed)
Established Patient Office Visit  Subjective:  Patient ID: Stacy [name], female    DOB: 09-04-1926  Age: 86 y.o. MRN: [id number]  CC:  Chief Complaint  Patient presents with  . Diabetes  . Hypertension  . Hyperlipidemia  . Atrial Fibrillation    HPI Stacy [name] presents for Chronic visit.     Patient present with type 2 diabetes.  Specifically, this is type 2, non insulin requiring diabetes, complicated by hypertension and hypercholesterolemia..  Compliance with treatment has been good; patient take medicines as directed, maintains diet and exercise regimen, follows up as directed, and is keeping glucose diary.  Date of  diagnosis 2010.  Depression screen has been performed.Tobacco screen nonsmoker. Current medicines for diabetes 10 years.  Patient is on nothing for renal protection and rosuvastatin for cholesterol control.  Patient performs foot exams daily and last ophthalmologic exam was last year.  Patient presents for follow up of hypertension.  Patient tolerating HCTZ, Metoprolol well with side effects.  Patient was diagnosed with hypertension 2010 so has been treated for hypertension for 10 years.Patient is working on maintaining diet and exercise regimen and follows up as directed. Complication include none.  Patient presents with hyperlipidemia.  Compliance with treatment has been good; patient takes medicines as directed, maintains low cholesterol diet, follows up as directed, and maintains exercise regimen.  Patient is using rosuvastatin without problems.  Patient has CAF.  NO RVR and is stable.  Past Medical History:  Diagnosis Date  . Anxiety   . Arthritis    Cervical DDD C5-6 and C6-7  . Atheroembolism   . Blue toe syndrome ([place])   . CAD (coronary artery disease)   . Cancer ([place])    skin BCC  Forehead and  SCC Left Lower Leg  . Carotid artery occlusion   . Chronic kidney disease, stage III (moderate)   . DDD (degenerative disc disease)   . Depression   . DM  (diabetes mellitus) ([place])   . Gall stones   . Gallstones   . GERD (gastroesophageal reflux disease)   . Heart murmur   . Heart murmur   . Hemorrhoid   . Hemorrhoids   . Hiatal hernia   . History of shingles   . Hyperlipidemia   . Hypertension   . Leg pain   . Mild incontinence   . Multinodular goiter   . Multiple thyroid nodules    checked every year  . Myocardial infarction ([place]) 03/15/95  . Osteopenia   . Peptic ulcer disease   . Stenosis of right carotid artery   . Stomach cancer ([place]) 1980  . Stroke ([place])   . Thyroid nodule   . Vision abnormalities     Past Surgical History:  Procedure Laterality Date  . 3 epidural steroid injections    . APPENDECTOMY    . Basal Cell Cancer Removal     forehead  . CATARACT EXTRACTION    . CATARACT EXTRACTION  12/29/2003  . CORONARY ANGIOPLASTY  03-15-1995  . CORONARY ANGIOPLASTY WITH STENT PLACEMENT  05-19-1998  . CORONARY STENT PLACEMENT  2000  . DILATION AND CURETTAGE OF UTERUS    . ENDARTERECTOMY Right 04/13/2015   Procedure: ENDARTERECTOMY CAROTID;  Surgeon: Serafina Mitchell, MD;  Location: [facility] OR;  Service: Vascular;  Laterality: Right;  . ENDOMETRIAL BIOPSY    . EYE SURGERY    . heart catherization    . HEMORRHOID SURGERY    . INCISIONAL HERNIA REPAIR    .  INGUINAL HERNIA REPAIR    . LOOP RECORDER INSERTION N/A 06/24/2016   Procedure: Loop Recorder Insertion;  Surgeon: Deboraha Sprang, MD;  Location: [place] CV LAB;  Service: Cardiovascular;  Laterality: N/A;  . PATCH ANGIOPLASTY Right 04/13/2015   Procedure: PATCH ANGIOPLASTY;  Surgeon: Serafina Mitchell, MD;  Location: [facility] OR;  Service: Vascular;  Laterality: Right;  . PTCA    . SQUAMOUS CELL CARCINOMA EXCISION     left lower leg  . TEE WITHOUT CARDIOVERSION N/A 06/24/2016   Procedure: TRANSESOPHAGEAL ECHOCARDIOGRAM (TEE);  Surgeon: Dorothy Spark, MD;  Location: [facility] ENDOSCOPY;  Service: Cardiovascular;  Laterality: N/A;  . TONSILLECTOMY      Family History  Problem  Relation Age of Onset  . Diabetes Father   . Prostate cancer Father        meta to colon  . Cancer Father        Prostate  . Parkinsonism Mother   . Stroke Paternal Uncle   . Hypertension Paternal Uncle   . Heart attack Neg Hx     Social History   Socioeconomic History  . Marital status: Widowed    Spouse name: Not on file  . Number of children: 1  . Years of education: Not on file  . Highest education level: Not on file  Occupational History  . 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 Smoking status: Former Smoker    Packs/day: 1.00    Years: 54.00    Pack years: 54.00    Types: Cigarettes    Quit date: 10/05/1998    Years since quitting: 20.5  . Smokeless tobacco: Never Used  Substance and Sexual Activity  . Alcohol use: Not Currently    Alcohol/week: 0.0 standard drinks  . Drug use: Never  . Sexual activity: Never  Other Topics Concern  . Not on file  Social History Narrative  . Not on file   Social Determinants of Health   Financial Resource Strain:   . Difficulty of Paying Living Expenses: Not on file  Food Insecurity:   . Worried About Charity fundraiser in the Last Year: Not on file  . Ran Out of Food in the Last Year: Not on file  Transportation Needs:   . Lack of Transportation (Medical): Not on file  . Lack of Transportation (Non-Medical): Not on file  Physical Activity:   . Days of Exercise per Week: Not on file  . Minutes of Exercise per Session: Not on file  Stress:   . Feeling of Stress : Not on file  Social Connections:   . Frequency of Communication with Friends and Family: Not on file  . Frequency of Social Gatherings with Friends and Family: Not on file  . Attends Religious Services: Not on file  . Active Member of Clubs or Organizations: Not on file  . Attends Archivist Meetings: Not on file  . Marital Status: Not on file  Intimate Partner Violence:   . Fear of Current or  Ex-Partner: Not on file  . Emotionally Abused: Not on file  . Physically Abused: Not on file  . Sexually Abused: Not on file    Outpatient Medications Prior to Visit  Medication Sig Dispense Refill  . ACCU-CHEK SMARTVIEW test strip 1 each by Other route as needed for other (blood sugar test strips).   11  . acetaminophen (TYLENOL) 325 MG tablet Take 325 mg by  mouth every 6 (six) hours as needed for mild pain or moderate pain.    Marland Kitchen apixaban (ELIQUIS) 5 MG TABS tablet Take 1 tablet (5 mg total) by mouth 2 (two) times daily. 180 tablet 3  . Cholecalciferol (VITAMIN D-3) 25 MCG (1000 UT) CAPS Take 1,000 Units by mouth 2 (two) times daily.    Marland Kitchen conjugated estrogens (PREMARIN) vaginal cream Place 1 g vaginally once a week. .625 mg once weekly    . diphenhydrAMINE (BENADRYL) 25 mg capsule Take 25 mg by mouth as needed for allergies.     Marland Kitchen docusate sodium (COLACE) 100 MG capsule Take 100 mg by mouth daily as needed.     . hydrochlorothiazide (MICROZIDE) 12.5 MG capsule Take 1 capsule (12.5 mg total) by mouth daily. 90 capsule 3  . Melatonin 10 MG TABS Take 10 mg by mouth as needed.    . metFORMIN (GLUCOPHAGE) 500 MG tablet Take 500 mg by mouth 2 (two) times daily. 250MG IN AM; 500MG IN PM    . metoprolol succinate (TOPROL-XL) 25 MG 24 hr tablet Take 1 tablet (25 mg total) by mouth daily. May take extra 1/2 tablet as needed for breakthrough 90 tablet 3  . NEOMYCIN-POLYMYXIN-HYDROCORTISONE (CORTISPORIN) 1 % SOLN otic solution Place 2 drops into both ears as directed.   3  . nitroGLYCERIN (NITROSTAT) 0.4 MG SL tablet Place 1 tablet (0.4 mg total) under the tongue every 5 (five) minutes as needed for chest pain. 25 tablet 3  . rosuvastatin (CRESTOR) 10 MG tablet TAKE 1 TABLET BY MOUTH EVERY DAY 90 tablet 1  . sertraline (ZOLOFT) 50 MG tablet Take 50 mg by mouth daily.    . hydrocortisone 2.5 % cream Apply 1 application topically daily as needed (for itching).   1  . sertraline (ZOLOFT) 25 MG tablet Take  1 tablet (25 mg total) by mouth daily. 90 tablet 2  . trolamine salicylate (ASPERCREME) 10 % cream Apply 1 application topically as needed for muscle pain.     No facility-administered medications prior to visit.    Allergies  Allergen Reactions  . Iohexol Other (See Comments)     Desc: HIVES- 13 HR PRE-MEDS ARE REQUIRED-ASM- 03/21/05,SULFA,ADHESIVE TAPE   . Sulfonamide Derivatives Hives    REACTION: hives  . Latex Other (See Comments)    Adhesive tape and EKG adhesive leads to rash  . Tranilast Hives    Investigational medication  . Ivp Dye [Iodinated Diagnostic Agents]   . Tape     Adhesive tap    ROS Review of Systems  Constitutional: Negative.   HENT: Positive for hearing loss.   Eyes: Negative.   Respiratory: Negative.   Cardiovascular: Negative.   Gastrointestinal: Negative.   Endocrine: Negative.   Genitourinary: Negative.   Musculoskeletal: Negative.   Neurological: Negative.   Hematological: Negative.   Psychiatric/Behavioral: Negative.       Objective:    Physical Exam  Constitutional: She is oriented to person, place, and time. She appears well-developed and well-nourished.  HENT:  Head: Normocephalic and atraumatic.  Eyes: Pupils are equal, round, and reactive to light. Conjunctivae and EOM are normal.  Cardiovascular: Normal rate.  irregular irregular rhythm  Pulmonary/Chest: Effort normal and breath sounds normal.  Abdominal: Soft. Bowel sounds are normal.  Musculoskeletal:        General: Normal range of motion.     Cervical back: Normal range of motion and neck supple.  Neurological: She is alert and oriented to person, place, and time.  She has normal reflexes.  Skin: Skin is warm and dry.  Psychiatric: She has a normal mood and affect.  Vitals reviewed.   BP 140/70 (BP Location: Left Arm, Patient Position: Sitting)   Pulse 74   Temp 98.2 F (36.8 C) (Temporal)   Resp 17   Ht 5' 3"  (1.6 m)   Wt 153 lb 9.6 oz (69.7 kg)   BMI 27.21 kg/m   Wt Readings from Last 3 Encounters:  04/21/19 153 lb 9.6 oz (69.7 kg)  01/18/19 152 lb 12.8 oz (69.3 kg)  01/01/19 151 lb 9.6 oz (68.8 kg)     Health Maintenance Due  Topic Date Due  . OPHTHALMOLOGY EXAM  04/19/1938  . TETANUS/TDAP  04/19/1947  . PNA vac Low Risk Adult (1 of 2 - PCV13) 04/18/1993    There are no preventive care reminders to display for this patient.  No results found for: TSH Lab Results  Component Value Date   WBC 8.2 04/19/2019   HGB 14.4 04/19/2019   HCT 43.0 04/19/2019   MCV 86 04/19/2019   PLT 196 04/19/2019   Lab Results  Component Value Date   NA 134 04/19/2019   K 3.9 04/19/2019   CO2 22 04/19/2019   GLUCOSE 144 (H) 04/19/2019   BUN 12 04/19/2019   CREATININE 1.09 (H) 04/19/2019   BILITOT 1.0 04/19/2019   ALKPHOS 64 04/19/2019   AST 38 04/19/2019   ALT 28 04/19/2019   PROT 6.1 04/19/2019   ALBUMIN 4.1 04/19/2019   CALCIUM 9.6 04/19/2019   ANIONGAP 9 08/29/2016   GFR 56.18 (L) 01/16/2012   Lab Results  Component Value Date   CHOL 125 04/19/2019   Lab Results  Component Value Date   HDL 50 04/19/2019   Lab Results  Component Value Date   LDLCALC 53 04/19/2019   Lab Results  Component Value Date   TRIG 125 04/19/2019   Lab Results  Component Value Date   CHOLHDL 2.4 06/23/2016   Lab Results  Component Value Date   HGBA1C 7.8 (H) 04/19/2019      Assessment & Plan:   Problem List Items Addressed This Visit      Cardiovascular and Mediastinum   Essential hypertension - Primary    An individual care plan was established and reinforced today.  The patient's status was assessed using clinical findings on exam and labs or diagnostic tests. The patient's success at meeting treatment goals on disease specific evidence-based guidelines and found to be well controlled. SELF MANAGEMENT: The patient and I together assessed ways to personally work towards obtaining the recommended goals. RECOMMENDATIONS: avoid decongestants found  in common cold remedies, decrease consumption of alcohol, perform routine monitoring of BP with home BP cuff, exercise, reduction of dietary salt, take medicines as prescribed, try not to miss doses and quit smoking.  Regular exercise and maintaining a healthy weight is needed.  Stress reduction may help. A CLINICAL SUMMARY including written plan identify barriers to care unique to individual due to social or financial issues.  We attempt to mutually creat solutions for individual and family understanding.      Obesity, diabetes, and hypertension syndrome ([place])    An individual care plan was established and reinforced today.  The patient's status was assessed using clinical findings on exam, labs and diagnostic testing. Patient success at meeting goals based on disease specific evidence-based guidelines and found to be good controlled. Medications were assessed and patient's understanding of the medical issues , including barriers  were assessed. Recommend adherence to a diabetic diet, a graduated exercise program, HgbA1c level is checked quarterly, and urine microalbumin performed yearly .  Annual mono-filament sensation testing performed. Lower blood pressure and control hyperlipidemia is important. Get annual eye exams and annual flu shots and smoking cessation discussed.  Self management goals were discussed.      Relevant Orders   POCT UA - Microalbumin (Completed)     Endocrine   Diabetic glomerulopathy ([place])    AN INDIVIDUAL CARE PLAN was established and reinforced today.  The patient's status was assessed using clinical findings on exam, labs, and other diagnostic testing. Patient's success at meeting treatment goals based on disease specific evidence-bassed guidelines and found to be in good control. RECOMMENDATIONS include maintaining present medicines and treatment.        Nervous and Auditory   Mixed conductive and sensorineural hearing loss, bilateral   Hemiparesis affecting left  side as late effect of cerebrovascular accident [facility])     Genitourinary   Chronic kidney disease, stage 3a    AN INDIVIDUAL CARE PLAN was established and reinforced today.  The patient's status was assessed using clinical findings on exam, labs, and other diagnostic testing. Patient's success at meeting treatment goals based on disease specific evidence-bassed guidelines and found to be in fair control. RECOMMENDATIONS include maintaining present medicines and treatment.        Other   Pernicious anemia    Patient continues on vitamin D regularly.      Bilateral nonexudative age-related macular degeneration    AN INDIVIDUAL CARE PLAN was established and reinforced today.  The patient's status was assessed using clinical findings on exam, labs, and other diagnostic testing. Patient's success at meeting treatment goals based on disease specific evidence-bassed guidelines and found to be in fair control. RECOMMENDATIONS include maintaining present medicines and treatment.         No orders of the defined types were placed in this encounter.   Follow-up: Return in about 4 months (around 08/21/2019) for fasting.    Reinaldo Meeker, MD

## 2019-04-21 NOTE — Assessment & Plan Note (Signed)
AN INDIVIDUAL CARE PLAN was established and reinforced today.  The patient's status was assessed using clinical findings on exam, labs, and other diagnostic testing. Patient's success at meeting treatment goals based on disease specific evidence-bassed guidelines and found to be in fair control. RECOMMENDATIONS include maintaining present medicines and treatment. 

## 2019-04-22 NOTE — Telephone Encounter (Signed)
Transmission reviewed sent 04/22/19 for additional episodes of NSVT . No other episodes of NSVT other than the one documented below on 04/20/19. Rodena Piety (daughter) reports patient did not feel episode or have any change in condition on 04/20/19. Patient has follow up in AF clinic on 04/27/19 with [facility]. ED precautions reviewed for CP, SOB, or syncope.

## 2019-04-22 NOTE — Telephone Encounter (Signed)
Pt daughter Luberta Mutter left a message that she was returning [place] phone call. She can be reached at [phone].

## 2019-04-25 NOTE — Telephone Encounter (Signed)
Folks,  I wonder how much value there is in ongoing monitoring  We know she has VTNS and AFib And not inclined to do much different Thoughts ? SK

## 2019-04-27 ENCOUNTER — Other Ambulatory Visit: Payer: Self-pay

## 2019-04-27 ENCOUNTER — Ambulatory Visit (HOSPITAL_COMMUNITY)
Admission: RE | Admit: 2019-04-27 | Discharge: 2019-04-27 | Disposition: A | Discharge status: Home / Self Care | Payer: Medicare Other | Source: Ambulatory Visit | Attending: Physician Assistant | Admitting: Physician Assistant

## 2019-04-27 ENCOUNTER — Encounter (HOSPITAL_COMMUNITY): Payer: Self-pay | Admitting: Physician Assistant

## 2019-04-27 VITALS — BP 110/68 | HR 58 | Ht 63.0 in | Wt 156.2 lb

## 2019-04-27 DIAGNOSIS — Z85828 Personal history of other malignant neoplasm of skin: Secondary | ICD-10-CM | POA: Insufficient documentation

## 2019-04-27 DIAGNOSIS — Z888 Allergy status to other drugs, medicaments and biological substances status: Secondary | ICD-10-CM | POA: Diagnosis not present

## 2019-04-27 DIAGNOSIS — Z8249 Family history of ischemic heart disease and other diseases of the circulatory system: Secondary | ICD-10-CM | POA: Insufficient documentation

## 2019-04-27 DIAGNOSIS — Z833 Family history of diabetes mellitus: Secondary | ICD-10-CM | POA: Insufficient documentation

## 2019-04-27 DIAGNOSIS — I480 Paroxysmal atrial fibrillation: Secondary | ICD-10-CM | POA: Insufficient documentation

## 2019-04-27 DIAGNOSIS — N183 Chronic kidney disease, stage 3 unspecified: Secondary | ICD-10-CM | POA: Diagnosis not present

## 2019-04-27 DIAGNOSIS — I129 Hypertensive chronic kidney disease with stage 1 through stage 4 chronic kidney disease, or unspecified chronic kidney disease: Secondary | ICD-10-CM | POA: Diagnosis not present

## 2019-04-27 DIAGNOSIS — Z955 Presence of coronary angioplasty implant and graft: Secondary | ICD-10-CM | POA: Diagnosis not present

## 2019-04-27 DIAGNOSIS — Z87891 Personal history of nicotine dependence: Secondary | ICD-10-CM | POA: Insufficient documentation

## 2019-04-27 DIAGNOSIS — E1122 Type 2 diabetes mellitus with diabetic chronic kidney disease: Secondary | ICD-10-CM | POA: Insufficient documentation

## 2019-04-27 DIAGNOSIS — I252 Old myocardial infarction: Secondary | ICD-10-CM | POA: Insufficient documentation

## 2019-04-27 DIAGNOSIS — Z882 Allergy status to sulfonamides status: Secondary | ICD-10-CM | POA: Diagnosis not present

## 2019-04-27 DIAGNOSIS — Z7984 Long term (current) use of oral hypoglycemic drugs: Secondary | ICD-10-CM | POA: Diagnosis not present

## 2019-04-27 DIAGNOSIS — Z8673 Personal history of transient ischemic attack (TIA), and cerebral infarction without residual deficits: Secondary | ICD-10-CM | POA: Insufficient documentation

## 2019-04-27 DIAGNOSIS — D6869 Other thrombophilia: Secondary | ICD-10-CM

## 2019-04-27 DIAGNOSIS — Z7901 Long term (current) use of anticoagulants: Secondary | ICD-10-CM | POA: Insufficient documentation

## 2019-04-27 DIAGNOSIS — E785 Hyperlipidemia, unspecified: Secondary | ICD-10-CM | POA: Diagnosis not present

## 2019-04-27 DIAGNOSIS — Z79899 Other long term (current) drug therapy: Secondary | ICD-10-CM | POA: Insufficient documentation

## 2019-04-27 DIAGNOSIS — I2510 Atherosclerotic heart disease of native coronary artery without angina pectoris: Secondary | ICD-10-CM | POA: Insufficient documentation

## 2019-04-27 NOTE — Progress Notes (Signed)
Primary Care Physician: Lillard Anes, MD Primary Cardiologist: Dr [place] Knack Primary Electrophysiologist: Dr Caryl Comes Referring Physician: Dr Canary Brim is a 86 y.o. female with a history of CVA, CAD, CKD, DM, HLD, HTN, paroxysmal atrial fibrillation who presents for follow up in [facility].  Stacy [name] was initially diagnosed with atrial fibrillation on ILR which was implanted after a cryptogenic stroke in 2018. [name] has done very well with a low AF burden on metoprolol. Stacy [name] is on Eliquis for a CHADS2VASC score of 8. On 12/29/18, [facility] device clinic received an alert that Stacy [name] had been in afib with RVR since 12/28/18. [name] initially stated Stacy [name] was asymptomatic but in hindsight feels that Stacy [name] had been more fatigued over Stacy few days previous. Stacy [name] converted spontaneously to SR.  On follow up today, [name] reports that Stacy [name] has done well. Stacy [name] denies any heart racing or palpitations. Stacy [name] did have one episode 04/06/19 which resolved spontaneously. Stacy [name] is tolerating Stacy medication without difficulty.   Today, Stacy [name] denies symptoms of palpitations, chest pain, shortness of breath, orthopnea, PND, lower extremity edema, dizziness, presyncope, syncope, snoring, daytime somnolence, bleeding, or neurologic sequela. Stacy [name] is tolerating medications without difficulties and is otherwise without complaint today.    Atrial Fibrillation Risk Factors:  Stacy [name] does not have symptoms or diagnosis of sleep apnea. Stacy [name] does not have a history of rheumatic fever.   Stacy [name] has a BMI of Body mass index is 27.67 kg/m.Marland Kitchen Filed Weights   04/27/19 1428  Weight: 70.9 kg    Family History  Problem Relation Age of Onset  . Diabetes Father   . Prostate cancer Father        meta to colon  . Cancer Father        Prostate  . Parkinsonism Mother   . Stroke Paternal Uncle   . Hypertension Paternal Uncle   . Heart attack Neg Hx      Atrial Fibrillation Management  history:  Previous antiarrhythmic drugs: none Previous cardioversions: none Previous ablations: none CHADS2VASC score: 8 Anticoagulation history: Eliquis   Past Medical History:  Diagnosis Date  . Anxiety   . Arthritis    Cervical DDD C5-6 and C6-7  . Atheroembolism   . Blue toe syndrome ([place])   . CAD (coronary artery disease)   . Cancer ([place])    skin BCC  Forehead and  SCC Left Lower Leg  . Carotid artery occlusion   . Chronic kidney disease, stage III (moderate)   . DDD (degenerative disc disease)   . Depression   . DM (diabetes mellitus) ([place])   . Gall stones   . Gallstones   . GERD (gastroesophageal reflux disease)   . Heart murmur   . Heart murmur   . Hemorrhoid   . Hemorrhoids   . Hiatal hernia   . History of shingles   . Hyperlipidemia   . Hypertension   . Leg pain   . Mild incontinence   . Multinodular goiter   . Multiple thyroid nodules    checked every year  . Myocardial infarction ([place]) 03/15/95  . Osteopenia   . Peptic ulcer disease   . Stenosis of right carotid artery   . Stomach cancer ([place]) 1980  . Stroke ([place])   . Thyroid nodule   . Vision abnormalities    Past Surgical History:  Procedure Laterality Date  . 3 epidural steroid injections    . APPENDECTOMY    .  Basal Cell Cancer Removal     forehead  . CATARACT EXTRACTION    . CATARACT EXTRACTION  12/29/2003  . CORONARY ANGIOPLASTY  03-15-1995  . CORONARY ANGIOPLASTY WITH STENT PLACEMENT  05-19-1998  . CORONARY STENT PLACEMENT  2000  . DILATION AND CURETTAGE OF UTERUS    . ENDARTERECTOMY Right 04/13/2015   Procedure: ENDARTERECTOMY CAROTID;  Surgeon: Serafina Mitchell, MD;  Location: [facility] OR;  Service: Vascular;  Laterality: Right;  . ENDOMETRIAL BIOPSY    . EYE SURGERY    . heart catherization    . HEMORRHOID SURGERY    . INCISIONAL HERNIA REPAIR    . INGUINAL HERNIA REPAIR    . LOOP RECORDER INSERTION N/A 06/24/2016   Procedure: Loop Recorder Insertion;  Surgeon: Deboraha Sprang, MD;   Location: [place] CV LAB;  Service: Cardiovascular;  Laterality: N/A;  . PATCH ANGIOPLASTY Right 04/13/2015   Procedure: PATCH ANGIOPLASTY;  Surgeon: Serafina Mitchell, MD;  Location: [facility] OR;  Service: Vascular;  Laterality: Right;  . PTCA    . SQUAMOUS CELL CARCINOMA EXCISION     left lower leg  . TEE WITHOUT CARDIOVERSION N/A 06/24/2016   Procedure: TRANSESOPHAGEAL ECHOCARDIOGRAM (TEE);  Surgeon: Dorothy Spark, MD;  Location: [facility] ENDOSCOPY;  Service: Cardiovascular;  Laterality: N/A;  . TONSILLECTOMY      Current Outpatient Medications  Medication Sig Dispense Refill  . ACCU-CHEK SMARTVIEW test strip 1 each by Other route as needed for other (blood sugar test strips).   11  . acetaminophen (TYLENOL) 325 MG tablet Take 325 mg by mouth every 6 (six) hours as needed for mild pain or moderate pain.    Marland Kitchen apixaban (ELIQUIS) 5 MG TABS tablet Take 1 tablet (5 mg total) by mouth 2 (two) times daily. 180 tablet 3  . Cholecalciferol (VITAMIN D-3) 25 MCG (1000 UT) CAPS Take 1,000 Units by mouth 2 (two) times daily.    Marland Kitchen conjugated estrogens (PREMARIN) vaginal cream Place 1 g vaginally once a week. .625 mg once weekly    . diphenhydrAMINE (BENADRYL) 25 mg capsule Take 25 mg by mouth as needed for allergies.     Marland Kitchen docusate sodium (COLACE) 100 MG capsule Take 100 mg by mouth daily as needed.     . hydrochlorothiazide (MICROZIDE) 12.5 MG capsule Take 1 capsule (12.5 mg total) by mouth daily. 90 capsule 3  . Melatonin 10 MG TABS Take 10 mg by mouth as needed.    . metFORMIN (GLUCOPHAGE) 500 MG tablet Take 500 mg by mouth 2 (two) times daily. 250MG IN AM; 500MG IN PM    . metoprolol succinate (TOPROL-XL) 25 MG 24 hr tablet Take 1 tablet (25 mg total) by mouth daily. May take extra 1/2 tablet as needed for breakthrough 90 tablet 3  . NEOMYCIN-POLYMYXIN-HYDROCORTISONE (CORTISPORIN) 1 % SOLN otic solution Place 2 drops into both ears as directed.   3  . nitroGLYCERIN (NITROSTAT) 0.4 MG SL tablet Place 1  tablet (0.4 mg total) under [place]acy tongue every 5 (five) minutes as needed for chest pain. 25 tablet 3  . rosuvastatin (CRESTOR) 10 MG tablet TAKE 1 TABLET BY MOUTH EVERY DAY 90 tablet 1  . sertraline (ZOLOFT) 50 MG tablet Take 50 mg by mouth daily.     No current facility-administered medications for this encounter.    Allergies  Allergen Reactions  . Iohexol Other (See Comments)     Desc: HIVES- 13 HR PRE-MEDS ARE REQUIRED-ASM- 03/21/05,SULFA,ADHESIVE TAPE   . Sulfonamide Derivatives Hives  REACTION: hives  . Latex Other (See Comments)    Adhesive tape and EKG adhesive leads to rash  . Tranilast Hives    Investigational medication  . Ivp Dye [Iodinated Diagnostic Agents]   . Tape     Adhesive tap    Social History   Socioeconomic History  . Marital status: Widowed    Spouse name: Not on file  . Number of children: 1  . Years of education: Not on file  . Highest education level: Not on file  Occupational History  . 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 Smoking status: Former Smoker    Packs/day: 1.00    Years: 54.00    Pack years: 54.00    Types: Cigarettes    Quit date: 10/05/1998    Years since quitting: 20.5  . Smokeless tobacco: Never Used  Substance and Sexual Activity  . Alcohol use: Not Currently    Alcohol/week: 0.0 standard drinks  . Drug use: Never  . Sexual activity: Never  Other Topics Concern  . Not on file  Social History Narrative  . Not on file   Social Determinants of Health   Financial Resource Strain:   . Difficulty of Paying Living Expenses:   Food Insecurity:   . Worried About Charity fundraiser in [place] Last Year:   . Arboriculturist in [place] Last Year:   Transportation Needs:   . Film/video editor (Medical):   Marland Kitchen Lack of Transportation (Non-Medical):   Physical Activity:   . Days of Exercise per Week:   . Minutes of Exercise per Session:   Stress:   . Feeling of Stress :   Social  Connections:   . Frequency of Communication with Friends and Family:   . Frequency of Social Gatherings with Friends and Family:   . Attends Religious Services:   . Active Member of Clubs or Organizations:   . Attends Archivist Meetings:   Marland Kitchen Marital Status:   Intimate Partner Violence:   . Fear of Current or Ex-Partner:   . Emotionally Abused:   Marland Kitchen Physically Abused:   . Sexually Abused:      ROS- All systems are reviewed and negative except as per Stacy HPI above.  Physical Exam: Vitals:   04/27/19 1428  BP: 110/68  Pulse: (!) 58  Weight: 70.9 kg  Height: _0  (1.6 m)    GEN- Stacy [name] is well appearing elderly female, alert and oriented x 3 today.   HEENT-head normocephalic, atraumatic, sclera clear, conjunctiva pink, hearing intact, trachea midline. Lungs- Clear to ausculation bilaterally, normal work of breathing Heart- Regular rate and rhythm, no murmurs, rubs or gallops  GI- soft, NT, ND, + BS Extremities- no clubbing, cyanosis, or edema MS- no significant deformity or atrophy Skin- no rash or lesion Psych- euthymic mood, full affect Neuro- strength and sensation are intact   Wt Readings from Last 3 Encounters:  04/27/19 70.9 kg  04/21/19 69.7 kg  01/18/19 69.3 kg    EKG today demonstrates SB HR 58, RBBB, PAC, PR 178, QRS 122, QTc 428  Echo 06/23/16 demonstrated  - Left ventricle: There is a large apical and distal anterior   akinetic area. No definate thrombus is seen but if embolus   suspected may repeat with Definity. Stacy [name] is also a   consideration. Stacy cavity size was normal. Wall thickness was   increased in a pattern of  mild LVH. Systolic function was mildly   to moderately reduced. Stacy estimated ejection fraction was in [place]acy   range of 40% to 45%. Akinesis of Stacy mid-apicalanterior and   apical myocardium. - Mitral valve: There was moderate regurgitation directed   posteriorly and toward Stacy free wall. - Left atrium: Stacy atrium was  mildly dilated. - Tricuspid valve: There was moderate regurgitation. - Pulmonary arteries: Systolic pressure was mildly increased. PA   peak pressure: 40 mm Hg (S).  Epic records are reviewed at length today  Labs from PCP 12/30/18 WBC 10.8, Hgb 14.8, Hct 44.4, PLT 229 BUN 15, Cr 1.18, K+4.3, AST 28, ALT 23 Hgb A1c 7.6  Assessment and Plan:  1. Paroxysmal atrial fibrillation Overall afib burden on device 0.9%.  Continue Eliquis 5 mg BID Continue Toprol 25 mg daily. [name] may take an extra 1/2 dose of BB if needed for heart racing >100 bpm.   This patients CHA2DS2-VASc Score and unadjusted Ischemic Stroke Rate (% per year) is equal to 10.8 % stroke rate/year from a score of 8  Above score calculated as 1 point each if present [CHF, HTN, DM, Vascular=MI/PAD/Aortic Plaque, Age if 65-74, or Female] Above score calculated as 2 points each if present [Age > 75, or Stroke/TIA/TE]  2. CAD S/p PCI in LAD and RCA. No anginal symptoms. Followed by Dr Burt Knack.   3. HTN Stable, no changes today.   Follow up with Dr Burt Knack as scheduled. AF clinic as needed.    [facility] [address] [place], [place] [address] [phone] 04/27/2019 2:49 PM

## 2019-04-28 NOTE — Telephone Encounter (Signed)
Spoke with patient. Offered to discontinue monitoring per Dr. Olin Pia suggestion. Patient will plan to speak with her daughter, Rodena Piety, and have Rodena Piety call our office back with their decision. Direct number and office hours provided.

## 2019-05-04 ENCOUNTER — Telehealth: Payer: Self-pay

## 2019-05-04 ENCOUNTER — Other Ambulatory Visit: Payer: Self-pay | Admitting: *Deleted

## 2019-05-04 DIAGNOSIS — I6523 Occlusion and stenosis of bilateral carotid arteries: Secondary | ICD-10-CM

## 2019-05-04 NOTE — Telephone Encounter (Signed)
See phone encounter 04/21/19 regarding MD offer to D/c monitoring.  Daughter states she has spoken with pt and despite no recent changes based on known conditions, pt would like to continue monitoring.

## 2019-05-04 NOTE — Telephone Encounter (Signed)
Pt daughter was calling about their discussion about being followed. Please call Rodena Piety back.

## 2019-05-05 ENCOUNTER — Other Ambulatory Visit: Payer: Self-pay

## 2019-05-05 ENCOUNTER — Ambulatory Visit (INDEPENDENT_AMBULATORY_CARE_PROVIDER_SITE_OTHER): Payer: Medicare Other | Admitting: Physician Assistant

## 2019-05-05 ENCOUNTER — Ambulatory Visit (HOSPITAL_COMMUNITY)
Admission: RE | Admit: 2019-05-05 | Discharge: 2019-05-05 | Disposition: A | Discharge status: Home / Self Care | Payer: Medicare Other | Source: Ambulatory Visit | Attending: Surgery | Admitting: Surgery

## 2019-05-05 VITALS — BP 136/75 | HR 67 | Temp 97.3°F | Resp 16 | Ht 64.0 in | Wt 156.0 lb

## 2019-05-05 DIAGNOSIS — I6523 Occlusion and stenosis of bilateral carotid arteries: Secondary | ICD-10-CM

## 2019-05-05 DIAGNOSIS — I639 Cerebral infarction, unspecified: Secondary | ICD-10-CM

## 2019-05-05 NOTE — Progress Notes (Signed)
History of Present Illness:  Patient is a 86 y.o. year old female who is s/p right carotid endarterectomy with patch angioplasty for asymptomatic right carotid stenosis >80%on03/02/2017by Dr. Trula Slade.    The patient denies symptoms of TIA, amaurosis, or stroke.   Tobacco use: former smoker, quit in 2000  Pt meds include: Statin : yes ASA: no Other anticoagulants/antiplatelets: Eliquis for A fib  Past Medical History:  Diagnosis Date  . Anxiety   . Arthritis    Cervical DDD C5-6 and C6-7  . Atheroembolism   . Blue toe syndrome ([place])   . CAD (coronary artery disease)   . Cancer ([place])    skin BCC  Forehead and  SCC Left Lower Leg  . Carotid artery occlusion   . Chronic kidney disease, stage III (moderate)   . DDD (degenerative disc disease)   . Depression   . DM (diabetes mellitus) ([place])   . Gall stones   . Gallstones   . GERD (gastroesophageal reflux disease)   . Heart murmur   . Heart murmur   . Hemorrhoid   . Hemorrhoids   . Hiatal hernia   . History of shingles   . Hyperlipidemia   . Hypertension   . Leg pain   . Mild incontinence   . Multinodular goiter   . Multiple thyroid nodules    checked every year  . Myocardial infarction ([place]) 03/15/95  . Osteopenia   . Peptic ulcer disease   . Stenosis of right carotid artery   . Stomach cancer ([place]) 1980  . Stroke ([place])   . Thyroid nodule   . Vision abnormalities     Past Surgical History:  Procedure Laterality Date  . 3 epidural steroid injections    . APPENDECTOMY    . Basal Cell Cancer Removal     forehead  . CATARACT EXTRACTION    . CATARACT EXTRACTION  12/29/2003  . CORONARY ANGIOPLASTY  03-15-1995  . CORONARY ANGIOPLASTY WITH STENT PLACEMENT  05-19-1998  . CORONARY STENT PLACEMENT  2000  . DILATION AND CURETTAGE OF UTERUS    . ENDARTERECTOMY Right 04/13/2015   Procedure: ENDARTERECTOMY CAROTID;  Surgeon: Serafina Mitchell, MD;  Location: [facility] OR;  Service: Vascular;  Laterality: Right;  .  ENDOMETRIAL BIOPSY    . EYE SURGERY    . heart catherization    . HEMORRHOID SURGERY    . INCISIONAL HERNIA REPAIR    . INGUINAL HERNIA REPAIR    . LOOP RECORDER INSERTION N/A 06/24/2016   Procedure: Loop Recorder Insertion;  Surgeon: Deboraha Sprang, MD;  Location: [place] CV LAB;  Service: Cardiovascular;  Laterality: N/A;  . PATCH ANGIOPLASTY Right 04/13/2015   Procedure: PATCH ANGIOPLASTY;  Surgeon: Serafina Mitchell, MD;  Location[facility] OR;  Service: Vascular;  Laterality: Right;  . PTCA    . SQUAMOUS CELL CARCINOMA EXCISION     left lower leg  . TEE WITHOUT CARDIOVERSION N/A 06/24/2016   Procedure: TRANSESOPHAGEAL ECHOCARDIOGRAM (TEE);  Surgeon: Dorothy Spark, MD;  Location: [facility] ENDOSCOPY;  Service: Cardiovascular;  Laterality: N/A;  . TONSILLECTOMY       Social History Social History   Tobacco Use  . Smoking status: Former Smoker    Packs/day: 1.00    Years: 54.00    Pack years: 54.00    Types: Cigarettes    Quit date: 10/05/1998    Years since quitting: 20.5  . Smokeless tobacco: Never Used  Substance Use Topics  . Alcohol  use: Not Currently    Alcohol/week: 0.0 standard drinks  . Drug use: Never    Family History Family History  Problem Relation Age of Onset  . Diabetes Father   . Prostate cancer Father        meta to colon  . Cancer Father        Prostate  . Parkinsonism Mother   . Stroke Paternal Uncle   . Hypertension Paternal Uncle   . Heart attack Neg Hx     Allergies  Allergies  Allergen Reactions  . Iohexol Other (See Comments)     Desc: HIVES- 13 HR PRE-MEDS ARE REQUIRED-ASM- 03/21/05,SULFA,ADHESIVE TAPE   . Sulfonamide Derivatives Hives    REACTION: hives  . Latex Other (See Comments)    Adhesive tape and EKG adhesive leads to rash  . Tranilast Hives    Investigational medication  . Ivp Dye [Iodinated Diagnostic Agents]   . Tape     Adhesive tap     Current Outpatient Medications  Medication Sig Dispense Refill  . ACCU-CHEK  SMARTVIEW test strip 1 each by Other route as needed for other (blood sugar test strips).   11  . acetaminophen (TYLENOL) 325 MG tablet Take 325 mg by mouth every 6 (six) hours as needed for mild pain or moderate pain.    Marland Kitchen apixaban (ELIQUIS) 5 MG TABS tablet Take 1 tablet (5 mg total) by mouth 2 (two) times daily. 180 tablet 3  . Cholecalciferol (VITAMIN D-3) 25 MCG (1000 UT) CAPS Take 1,000 Units by mouth 2 (two) times daily.    Marland Kitchen conjugated estrogens (PREMARIN) vaginal cream Place 1 g vaginally once a week. .625 mg once weekly    . diphenhydrAMINE (BENADRYL) 25 mg capsule Take 25 mg by mouth as needed for allergies.     Marland Kitchen docusate sodium (COLACE) 100 MG capsule Take 100 mg by mouth daily as needed.     . hydrochlorothiazide (MICROZIDE) 12.5 MG capsule Take 1 capsule (12.5 mg total) by mouth daily. 90 capsule 3  . Melatonin 10 MG TABS Take 10 mg by mouth as needed.    . metFORMIN (GLUCOPHAGE) 500 MG tablet Take 500 mg by mouth 2 (two) times daily. 250MG IN AM; 500MG IN PM    . metoprolol succinate (TOPROL-XL) 25 MG 24 hr tablet Take 1 tablet (25 mg total) by mouth daily. May take extra 1/2 tablet as needed for breakthrough 90 tablet 3  . NEOMYCIN-POLYMYXIN-HYDROCORTISONE (CORTISPORIN) 1 % SOLN otic solution Place 2 drops into both ears as directed.   3  . nitroGLYCERIN (NITROSTAT) 0.4 MG SL tablet Place 1 tablet (0.4 mg total) under the tongue every 5 (five) minutes as needed for chest pain. 25 tablet 3  . rosuvastatin (CRESTOR) 10 MG tablet TAKE 1 TABLET BY MOUTH EVERY DAY 90 tablet 1  . sertraline (ZOLOFT) 50 MG tablet Take 50 mg by mouth daily.     No current facility-administered medications for this visit.    ROS:   General:  No weight loss, Fever, chills  HEENT: No recent headaches, no nasal bleeding, no visual changes, no sore throat  Neurologic: No dizziness, blackouts, seizures. No recent symptoms of stroke or mini- stroke. No recent episodes of slurred speech, or temporary  blindness.  Cardiac: No recent episodes of chest pain/pressure, no shortness of breath at rest.  No shortness of breath with exertion.  Denies history of atrial fibrillation or irregular heartbeat  Vascular: No history of rest pain in feet.  No  history of claudication.  No history of non-healing ulcer, No history of DVT   Pulmonary: No home oxygen, no productive cough, no hemoptysis,  No asthma or wheezing  Musculoskeletal:  [ ]  Arthritis, [ ]  Low back pain,  [x ] Joint pain  Hematologic:No history of hypercoagulable state.  No history of easy bleeding.  No history of anemia  Gastrointestinal: No hematochezia or melena,  No gastroesophageal reflux, no trouble swallowing  Urinary: [ ]  chronic Kidney disease, [ ]  on HD - [ ]  MWF or [ ]  TTHS, [ ]  Burning with urination, [ ]  Frequent urination, [ ]  Difficulty urinating;   Skin: No rashes  Psychological: No history of anxiety,  No history of depression   Physical Examination  Vitals:   05/05/19 1526 05/05/19 1531  BP: 136/71 136/75  Pulse: 67   Resp: 16   Temp: (!) 97.3 F (36.3 C)   TempSrc: Oral   SpO2: 96%   Weight: 156 lb (70.8 kg)   Height: 5' 4"  (1.626 m)     Body mass index is 26.78 kg/m.  General:  Alert and oriented, no acute distress HEENT: Normal Neck: No bruit or JVD Pulmonary: Clear to auscultation bilaterally Cardiac: Regular Rate and Rhythm without murmur Gastrointestinal: Soft, non-tender, non-distended, no mass, no scars Skin: No rash Extremity Pulses:  2+ radial, brachial, femoral, dorsalis pedis, posterior tibial pulses bilaterally Musculoskeletal: No deformity.  Mild edema B ankles.  Neurologic: Upper and lower extremity motor 5/5 and symmetric  DATA:  Right Carotid Findings:  +----------+--------+--------+--------+------------------+--------+       PSV cm/sEDV cm/sStenosisPlaque DescriptionComments  +----------+--------+--------+--------+------------------+--------+  CCA Prox 79    12   <50%  homogeneous          +----------+--------+--------+--------+------------------+--------+  CCA Mid  60   14   <50%  homogeneous          +----------+--------+--------+--------+------------------+--------+  CCA Distal69   17   <50%  heterogenous         +----------+--------+--------+--------+------------------+--------+  ICA Prox 58   19   1-39%  heterogenous         +----------+--------+--------+--------+------------------+--------+  ICA Mid  91   27                      +----------+--------+--------+--------+------------------+--------+  ICA Distal88   27                      +----------+--------+--------+--------+------------------+--------+  ECA    112   19   <50%  heterogenous         +----------+--------+--------+--------+------------------+--------+   +----------+--------+-------+----------------+-------------------+       PSV cm/sEDV cmsDescribe    Arm Pressure (mmHG)  +----------+--------+-------+----------------+-------------------+  [id number]       Multiphasic, WNL            +----------+--------+-------+----------------+-------------------+   +---------+--------+--+--------+-+---------+  VertebralPSV cm/s36EDV cm/s9Antegrade  +---------+--------+--+--------+-+---------+   Left Carotid Findings:  +----------+--------+--------+--------+------------------+--------+       PSV cm/sEDV cm/sStenosisPlaque DescriptionComments  +----------+--------+--------+--------+------------------+--------+  CCA Prox 76   14   <50%  homogeneous          +----------+--------+--------+--------+------------------+--------+  CCA Mid  80   20   <50%  homogeneous            +----------+--------+--------+--------+------------------+--------+  CCA Distal82   19   <50%  homogeneous          +----------+--------+--------+--------+------------------+--------+  ICA Prox 62   22   1-39%  homogeneous          +----------+--------+--------+--------+------------------+--------+  ICA Mid  76   24                      +----------+--------+--------+--------+------------------+--------+  ICA Distal168   37                      +----------+--------+--------+--------+------------------+--------+  ECA    97   12   <50%  homogeneous          +----------+--------+--------+--------+------------------+--------+   +----------+--------+--------+----------------+-------------------+       PSV cm/sEDV cm/sDescribe    Arm Pressure (mmHG)  +----------+--------+--------+----------------+-------------------+  Subclavian150       Multiphasic, WNL            +----------+--------+--------+----------------+-------------------+   +---------+--------+--+--------+-+---------+  VertebralPSV cm/s35EDV cm/s8Antegrade  +---------+--------+--+--------+-+---------+        Summary:  Right Carotid: Velocities in the right ICA are consistent with a 1-39%  stenosis.         Non-hemodynamically significant plaque <50% noted in the  CCA. The         ECA appears <50% stenosed.   Left Carotid: Velocities in the left ICA are consistent with a 1-39%  stenosis.        Non-hemodynamically significant plaque <50% noted in the  CCA. The        ECA appears <50% stenosed.   ASSESSMENT:  Asymptomatic carotid stenosis s/p right CEA by Dr. Trula Slade The carotid duplex demonstrates stenosis B < 39%.  She remains asymptomatic.  PLAN: She will f/u in 1 year for repeat carotid duplex surveillance.  We  reviewed signs and symptoms of stroke and TIA.  If she develops any she will call 911.    Roxy Horseman PA-C [facility] Office: [phone]  MD in clinic [place]

## 2019-05-06 ENCOUNTER — Other Ambulatory Visit: Payer: Self-pay | Admitting: *Deleted

## 2019-05-06 DIAGNOSIS — I6523 Occlusion and stenosis of bilateral carotid arteries: Secondary | ICD-10-CM

## 2019-05-07 ENCOUNTER — Telehealth: Payer: Self-pay | Admitting: Legal Medicine

## 2019-05-07 NOTE — Progress Notes (Signed)
  Chronic Care Management   Outreach Note  05/07/2019 Name: Stacy [name] MRN: [id number] DOB: 08/03/1926  Referred by: Lillard Anes, MD Reason for referral : No chief complaint on file.   An unsuccessful telephone outreach was attempted today. The patient was referred to the pharmacist for assistance with care management and care coordination.   Follow Up Plan:   [name] Upstream Scheduler

## 2019-05-10 NOTE — Telephone Encounter (Signed)
LMOVM for patient at home and cell numbers. Able to reach patient's daughter, Rodena Piety. Per Rodena Piety, patient wishes to continue LINQ monitoring for the time being. Battery estimated to last until at least 06/2019. Will make Dr. Caryl Comes aware. Rodena Piety denies any questions or concerns at this time.

## 2019-05-12 ENCOUNTER — Other Ambulatory Visit: Payer: Self-pay | Admitting: Internal Medicine

## 2019-05-18 ENCOUNTER — Telehealth: Payer: Self-pay | Admitting: Legal Medicine

## 2019-05-18 DIAGNOSIS — Z79899 Other long term (current) drug therapy: Secondary | ICD-10-CM | POA: Diagnosis not present

## 2019-05-18 DIAGNOSIS — I2510 Atherosclerotic heart disease of native coronary artery without angina pectoris: Secondary | ICD-10-CM | POA: Diagnosis not present

## 2019-05-18 DIAGNOSIS — E119 Type 2 diabetes mellitus without complications: Secondary | ICD-10-CM | POA: Diagnosis not present

## 2019-05-18 DIAGNOSIS — E538 Deficiency of other specified B group vitamins: Secondary | ICD-10-CM | POA: Diagnosis not present

## 2019-05-18 DIAGNOSIS — E042 Nontoxic multinodular goiter: Secondary | ICD-10-CM | POA: Diagnosis not present

## 2019-05-18 DIAGNOSIS — M8580 Other specified disorders of bone density and structure, unspecified site: Secondary | ICD-10-CM | POA: Diagnosis not present

## 2019-05-18 NOTE — Progress Notes (Signed)
  Chronic Care Management   Outreach Note  05/18/2019 Name: Stacy [name] MRN: [id number] DOB: October 06, 1926  Referred by: Lillard Anes, MD Reason for referral : No chief complaint on file.   An unsuccessful telephone outreach was attempted today. The patient was referred to the pharmacist for assistance with care management and care coordination.   Follow Up Plan:   [name] Upstream Scheduler

## 2019-05-20 ENCOUNTER — Ambulatory Visit (INDEPENDENT_AMBULATORY_CARE_PROVIDER_SITE_OTHER): Payer: Medicare Other | Admitting: *Deleted

## 2019-05-20 DIAGNOSIS — I639 Cerebral infarction, unspecified: Secondary | ICD-10-CM

## 2019-05-20 LAB — CUP PACEART REMOTE DEVICE CHECK
Date Time Interrogation Session: 20210408033539
Implantable Pulse Generator Implant Date: 20180514

## 2019-05-20 NOTE — Progress Notes (Signed)
ILR Remote 

## 2019-05-24 DIAGNOSIS — D2239 Melanocytic nevi of other parts of face: Secondary | ICD-10-CM | POA: Diagnosis not present

## 2019-05-24 DIAGNOSIS — L821 Other seborrheic keratosis: Secondary | ICD-10-CM | POA: Diagnosis not present

## 2019-05-24 DIAGNOSIS — D485 Neoplasm of uncertain behavior of skin: Secondary | ICD-10-CM | POA: Diagnosis not present

## 2019-05-24 DIAGNOSIS — L820 Inflamed seborrheic keratosis: Secondary | ICD-10-CM | POA: Diagnosis not present

## 2019-05-24 DIAGNOSIS — D1721 Benign lipomatous neoplasm of skin and subcutaneous tissue of right arm: Secondary | ICD-10-CM | POA: Diagnosis not present

## 2019-05-24 DIAGNOSIS — Z85828 Personal history of other malignant neoplasm of skin: Secondary | ICD-10-CM | POA: Diagnosis not present

## 2019-05-24 DIAGNOSIS — L570 Actinic keratosis: Secondary | ICD-10-CM | POA: Diagnosis not present

## 2019-05-27 ENCOUNTER — Telehealth: Payer: Self-pay | Admitting: Legal Medicine

## 2019-05-27 NOTE — Progress Notes (Signed)
  Chronic Care Management   Outreach Note  05/27/2019 Name: Stacy [name] MRN: [id number] DOB: [birth year]  Referred by: Lillard Anes, MD Reason for referral : No chief complaint on file.   An unsuccessful telephone outreach was attempted today. The patient was referred to the pharmacist for assistance with care management and care coordination.   Follow Up Plan:   [name] Upstream Scheduler

## 2019-06-03 DIAGNOSIS — M170 Bilateral primary osteoarthritis of knee: Secondary | ICD-10-CM | POA: Diagnosis not present

## 2019-06-03 DIAGNOSIS — M1711 Unilateral primary osteoarthritis, right knee: Secondary | ICD-10-CM | POA: Diagnosis not present

## 2019-06-03 DIAGNOSIS — M1712 Unilateral primary osteoarthritis, left knee: Secondary | ICD-10-CM | POA: Diagnosis not present

## 2019-06-08 ENCOUNTER — Telehealth: Payer: Self-pay | Admitting: Legal Medicine

## 2019-06-08 NOTE — Progress Notes (Signed)
  Chronic Care Management   Outreach Note  06/08/2019 Name: AASHKA SALOMONE MRN: [id number] DOB: Sep 04, 1926  Referred by: Lillard Anes, MD Reason for referral : No chief complaint on file.   An unsuccessful telephone outreach was attempted today. The patient was referred to the pharmacist for assistance with care management and care coordination.   This note is not being shared with the patient for the following reason: To respect privacy (The patient or proxy has requested that the information not be shared).  Follow Up Plan:   [name] Upstream Scheduler

## 2019-06-10 DIAGNOSIS — M170 Bilateral primary osteoarthritis of knee: Secondary | ICD-10-CM | POA: Diagnosis not present

## 2019-06-14 ENCOUNTER — Encounter: Payer: Self-pay | Admitting: Legal Medicine

## 2019-06-14 ENCOUNTER — Ambulatory Visit (INDEPENDENT_AMBULATORY_CARE_PROVIDER_SITE_OTHER): Payer: Medicare Other | Admitting: Legal Medicine

## 2019-06-14 ENCOUNTER — Other Ambulatory Visit: Payer: Self-pay

## 2019-06-14 VITALS — BP 102/64 | HR 78 | Temp 98.0°F | Resp 18 | Ht 63.0 in | Wt 154.2 lb

## 2019-06-14 DIAGNOSIS — J0110 Acute frontal sinusitis, unspecified: Secondary | ICD-10-CM | POA: Diagnosis not present

## 2019-06-14 DIAGNOSIS — J019 Acute sinusitis, unspecified: Secondary | ICD-10-CM | POA: Insufficient documentation

## 2019-06-14 MED ORDER — PREDNISONE 5 MG PO TABS: 5.0000 mg | ORAL_TABLET | Freq: Every day | ORAL | 0 refills | Status: DC

## 2019-06-14 MED ORDER — AZITHROMYCIN 250 MG PO TABS: ORAL_TABLET | ORAL | 0 refills | Status: DC

## 2019-06-14 NOTE — Progress Notes (Signed)
\\   Established Patient Office Visit  Subjective:  Patient ID: Stacy [name], female    DOB: [birth year]  Age: 86 y.o. MRN: [id number]  CC:  Chief Complaint  Patient presents with  . Sinusitis    Since one week ago, she feels nasal congestion, horseness  . Cough    HPI Stacy [name] presents for sinusitis for 2 weeks, she has frontal headache.  No fever or chills.  She is congested and coughing green phlegm.  Past Medical History:  Diagnosis Date  . Anxiety   . Arthritis    Cervical DDD C5-6 and C6-7  . Atheroembolism   . Blue toe syndrome ([place])   . CAD (coronary artery disease)   . Cancer ([place])    skin BCC  Forehead and  SCC Left Lower Leg  . Chronic kidney disease, stage III (moderate)   . DDD (degenerative disc disease)   . Depression   . Gall stones   . Gallstones   . Heart murmur   . Heart murmur   . Hemorrhoids   . Hiatal hernia   . History of shingles   . Hyperlipidemia   . Hypertension   . Leg pain   . Mild incontinence   . Multinodular goiter   . Myocardial infarction ([place]) 03/15/95  . Osteopenia   . Peptic ulcer disease   . Stenosis of right carotid artery   . Stomach cancer ([place]) 1980  . Stroke ([place])   . Thyroid nodule   . Vision abnormalities     Past Surgical History:  Procedure Laterality Date  . 3 epidural steroid injections    . APPENDECTOMY    . Basal Cell Cancer Removal     forehead  . CATARACT EXTRACTION    . CATARACT EXTRACTION  12/29/2003  . CORONARY ANGIOPLASTY  03-15-1995  . CORONARY ANGIOPLASTY WITH STENT PLACEMENT  05-19-1998  . CORONARY STENT PLACEMENT  2000  . DILATION AND CURETTAGE OF UTERUS    . ENDARTERECTOMY Right 04/13/2015   Procedure: ENDARTERECTOMY CAROTID;  Surgeon: Serafina Mitchell, MD;  Location: [facility] OR;  Service: Vascular;  Laterality: Right;  . ENDOMETRIAL BIOPSY    . EYE SURGERY    . heart catherization    . HEMORRHOID SURGERY    . INCISIONAL HERNIA REPAIR    . INGUINAL HERNIA REPAIR    . LOOP RECORDER INSERTION  N/A 06/24/2016   Procedure: Loop Recorder Insertion;  Surgeon: Deboraha Sprang, MD;  Location: [place] CV LAB;  Service: Cardiovascular;  Laterality: N/A;  . PATCH ANGIOPLASTY Right 04/13/2015   Procedure: PATCH ANGIOPLASTY;  Surgeon: Serafina Mitchell, MD;  Location: [facility] OR;  Service: Vascular;  Laterality: Right;  . PTCA    . SQUAMOUS CELL CARCINOMA EXCISION     left lower leg  . TEE WITHOUT CARDIOVERSION N/A 06/24/2016   Procedure: TRANSESOPHAGEAL ECHOCARDIOGRAM (TEE);  Surgeon: Dorothy Spark, MD;  Location: [facility] ENDOSCOPY;  Service: Cardiovascular;  Laterality: N/A;  . TONSILLECTOMY      Family History  Problem Relation Age of Onset  . Diabetes Father   . Prostate cancer Father        meta to colon  . Cancer Father        Prostate  . Parkinsonism Mother   . Stroke Paternal Uncle   . Hypertension Paternal Uncle   . Heart attack Neg Hx     Social History   Socioeconomic History  . Marital status: Widowed    Spouse  name: Not on file  . Number of children: 1  . Years of education: Not on file  . Highest education level: Not on file  Occupational History  . 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 Smoking status: Former Smoker    Packs/day: 1.00    Years: 54.00    Pack years: 54.00    Types: Cigarettes    Quit date: 10/05/1998    Years since quitting: 20.7  . Smokeless tobacco: Never Used  Substance and Sexual Activity  . Alcohol use: Not Currently    Alcohol/week: 0.0 standard drinks  . Drug use: Never  . Sexual activity: Not Currently  Other Topics Concern  . Not on file  Social History Narrative  . Not on file   Social Determinants of Health   Financial Resource Strain:   . Difficulty of Paying Living Expenses:   Food Insecurity:   . Worried About Charity fundraiser in the Last Year:   . Arboriculturist in the Last Year:   Transportation Needs:   . Film/video editor (Medical):   Marland Kitchen Lack of Transportation  (Non-Medical):   Physical Activity:   . Days of Exercise per Week:   . Minutes of Exercise per Session:   Stress:   . Feeling of Stress :   Social Connections:   . Frequency of Communication with Friends and Family:   . Frequency of Social Gatherings with Friends and Family:   . Attends Religious Services:   . Active Member of Clubs or Organizations:   . Attends Archivist Meetings:   Marland Kitchen Marital Status:   Intimate Partner Violence:   . Fear of Current or Ex-Partner:   . Emotionally Abused:   Marland Kitchen Physically Abused:   . Sexually Abused:     Outpatient Medications Prior to Visit  Medication Sig Dispense Refill  . ACCU-CHEK SMARTVIEW test strip 1 each by Other route as needed for other (blood sugar test strips).   11  . acetaminophen (TYLENOL) 325 MG tablet Take 325 mg by mouth every 6 (six) hours as needed for mild pain or moderate pain.    Marland Kitchen apixaban (ELIQUIS) 5 MG TABS tablet Take 1 tablet (5 mg total) by mouth 2 (two) times daily. 180 tablet 3  . cetirizine (ZYRTEC) 10 MG tablet Take 10 mg by mouth daily.    . Cholecalciferol (VITAMIN D-3) 25 MCG (1000 UT) CAPS Take 1,000 Units by mouth 2 (two) times daily.    Marland Kitchen conjugated estrogens (PREMARIN) vaginal cream Place 1 g vaginally once a week. .625 mg once weekly    . diphenhydrAMINE (BENADRYL) 25 mg capsule Take 25 mg by mouth as needed for allergies.     Marland Kitchen docusate sodium (COLACE) 100 MG capsule Take 100 mg by mouth daily as needed.     . hydrochlorothiazide (MICROZIDE) 12.5 MG capsule Take 1 capsule (12.5 mg total) by mouth daily. 90 capsule 3  . Melatonin 10 MG TABS Take 10 mg by mouth as needed.    . metFORMIN (GLUCOPHAGE) 500 MG tablet Take 500 mg by mouth 2 (two) times daily. 250MG IN AM; 500MG IN PM    . metoprolol succinate (TOPROL-XL) 25 MG 24 hr tablet Take 1 tablet (25 mg total) by mouth daily. May take extra 1/2 tablet as needed for breakthrough 90 tablet 3  . NEOMYCIN-POLYMYXIN-HYDROCORTISONE (CORTISPORIN) 1 % SOLN  otic solution Place 2 drops into both  ears as directed.   3  . nitroGLYCERIN (NITROSTAT) 0.4 MG SL tablet Place 1 tablet (0.4 mg total) under the tongue every 5 (five) minutes as needed for chest pain. 25 tablet 3  . rosuvastatin (CRESTOR) 10 MG tablet TAKE 1 TABLET BY MOUTH EVERY DAY 90 tablet 1  . sertraline (ZOLOFT) 50 MG tablet Take 50 mg by mouth daily.    . vitamin B-12 (CYANOCOBALAMIN) 1000 MCG tablet Take 1,000 mcg by mouth daily.     No facility-administered medications prior to visit.    Allergies  Allergen Reactions  . Iohexol Other (See Comments)     Desc: HIVES- 13 HR PRE-MEDS ARE REQUIRED-ASM- 03/21/05,SULFA,ADHESIVE TAPE   . Sulfonamide Derivatives Hives    REACTION: hives  . Latex Other (See Comments)    Adhesive tape and EKG adhesive leads to rash  . Tranilast Hives    Investigational medication  . Ivp Dye [Iodinated Diagnostic Agents]   . Tape     Adhesive tap    ROS Review of Systems  Constitutional: Negative.   HENT: Positive for congestion, sinus pressure and sinus pain.   Eyes: Negative.   Respiratory: Negative.   Cardiovascular: Negative.   Gastrointestinal: Negative.   Genitourinary: Negative.   Musculoskeletal: Negative.       Objective:    Physical Exam  Constitutional: She appears well-developed and well-nourished.  HENT:  Head: Normocephalic and atraumatic.  Right Ear: External ear normal.  Left Ear: External ear normal.  Nose: Nose normal.  Mouth/Throat: Oropharynx is clear and moist.  Eyes: Pupils are equal, round, and reactive to light. Conjunctivae and EOM are normal.  Cardiovascular: Normal rate, regular rhythm, normal heart sounds and intact distal pulses.  Pulmonary/Chest: Effort normal and breath sounds normal.  Musculoskeletal:     Cervical back: Normal range of motion.  Vitals reviewed.   BP 102/64 (BP Location: Right Arm, Patient Position: Sitting)   Pulse 78   Temp 98 F (36.7 C) (Temporal)   Resp 18   Ht 5' 3"  (1.6  m)   Wt 154 lb 3.2 oz (69.9 kg)   BMI 27.32 kg/m  Wt Readings from Last 3 Encounters:  06/14/19 154 lb 3.2 oz (69.9 kg)  05/05/19 156 lb (70.8 kg)  04/27/19 156 lb 3.2 oz (70.9 kg)     Health Maintenance Due  Topic Date Due  . OPHTHALMOLOGY EXAM  Never done  . TETANUS/TDAP  Never done  . PNA vac Low Risk Adult (1 of 2 - PCV13) Never done    There are no preventive care reminders to display for this patient.  No results found for: TSH Lab Results  Component Value Date   WBC 8.2 04/19/2019   HGB 14.4 04/19/2019   HCT 43.0 04/19/2019   MCV 86 04/19/2019   PLT 196 04/19/2019   Lab Results  Component Value Date   NA 134 04/19/2019   K 3.9 04/19/2019   CO2 22 04/19/2019   GLUCOSE 144 (H) 04/19/2019   BUN 12 04/19/2019   CREATININE 1.09 (H) 04/19/2019   BILITOT 1.0 04/19/2019   ALKPHOS 64 04/19/2019   AST 38 04/19/2019   ALT 28 04/19/2019   PROT 6.1 04/19/2019   ALBUMIN 4.1 04/19/2019   CALCIUM 9.6 04/19/2019   ANIONGAP 9 08/29/2016   GFR 56.18 (L) 01/16/2012   Lab Results  Component Value Date   CHOL 125 04/19/2019   Lab Results  Component Value Date   HDL 50 04/19/2019   Lab Results  Component Value Date   LDLCALC 53 04/19/2019   Lab Results  Component Value Date   TRIG 125 04/19/2019   Lab Results  Component Value Date   CHOLHDL 2.4 06/23/2016   Lab Results  Component Value Date   HGBA1C 7.8 (H) 04/19/2019      Assessment & Plan:   Problem List Items Addressed This Visit      Respiratory   Acute sinusitis - Primary    Frontal sinusitis.  Treat with prednisone pack and z-pack      Relevant Medications   cetirizine (ZYRTEC) 10 MG tablet   azithromycin (ZITHROMAX) 250 MG tablet   predniSONE (DELTASONE) 5 MG tablet      Meds ordered this encounter  Medications  . azithromycin (ZITHROMAX) 250 MG tablet    Sig: 2 tablets on day 1, then 1 tablet daily on days 2-4    Dispense:  6 tablet    Refill:  0  . predniSONE (DELTASONE) 5 MG  tablet    Sig: Take 1 tablet (5 mg total) by mouth daily with breakfast. Prednisone 6 pills first day, then 5 pills second day, then taper by one a day until gone    Dispense:  21 tablet    Refill:  0    Follow-up: Return if symptoms worsen or fail to improve.    Reinaldo Meeker, MD

## 2019-06-14 NOTE — Assessment & Plan Note (Signed)
Frontal sinusitis.  Treat with prednisone pack and z-pack

## 2019-06-17 ENCOUNTER — Ambulatory Visit (INDEPENDENT_AMBULATORY_CARE_PROVIDER_SITE_OTHER): Payer: Medicare Other | Admitting: Legal Medicine

## 2019-06-17 ENCOUNTER — Other Ambulatory Visit: Payer: Self-pay

## 2019-06-17 ENCOUNTER — Encounter: Payer: Self-pay | Admitting: Legal Medicine

## 2019-06-17 VITALS — BP 118/60 | HR 75 | Temp 97.8°F | Resp 17 | Ht 63.0 in | Wt 155.6 lb

## 2019-06-17 DIAGNOSIS — S0033XA Contusion of nose, initial encounter: Secondary | ICD-10-CM

## 2019-06-17 DIAGNOSIS — D6869 Other thrombophilia: Secondary | ICD-10-CM

## 2019-06-17 DIAGNOSIS — M1712 Unilateral primary osteoarthritis, left knee: Secondary | ICD-10-CM | POA: Diagnosis not present

## 2019-06-17 DIAGNOSIS — S81801A Unspecified open wound, right lower leg, initial encounter: Secondary | ICD-10-CM

## 2019-06-17 DIAGNOSIS — Z7901 Long term (current) use of anticoagulants: Secondary | ICD-10-CM

## 2019-06-17 DIAGNOSIS — W1809XA Striking against other object with subsequent fall, initial encounter: Secondary | ICD-10-CM | POA: Diagnosis not present

## 2019-06-17 DIAGNOSIS — S62502A Fracture of unspecified phalanx of left thumb, initial encounter for closed fracture: Secondary | ICD-10-CM | POA: Diagnosis not present

## 2019-06-17 DIAGNOSIS — M79642 Pain in left hand: Secondary | ICD-10-CM | POA: Diagnosis not present

## 2019-06-17 DIAGNOSIS — S62202A Unspecified fracture of first metacarpal bone, left hand, initial encounter for closed fracture: Secondary | ICD-10-CM | POA: Diagnosis not present

## 2019-06-17 DIAGNOSIS — M170 Bilateral primary osteoarthritis of knee: Secondary | ICD-10-CM | POA: Diagnosis not present

## 2019-06-17 DIAGNOSIS — M179 Osteoarthritis of knee, unspecified: Secondary | ICD-10-CM | POA: Diagnosis not present

## 2019-06-17 NOTE — Progress Notes (Signed)
Acute Office Visit  Subjective:    Patient ID: Stacy [name], female    DOB: 27-Oct-1926, 86 y.o.   MRN: [id number]  Chief Complaint  Patient presents with  . Fall    Last Tuesday and she hurt her nose and broke left thumb    HPI Patient is in today for fall on 06/15/2019 at home and fell onto face after catching foot on rub.  Face first on asphalt.  No LOC.  She had epistaxis but better now.  She went to orthopedics and found left thumb fractured and now she has splint.  She has contusions on face and skin tear on right lower leg.  No other injuries.  Patient is on chronic anticoagulation and will need head scan.  She does not have neurologic changes.  Past Medical History:  Diagnosis Date  . Anxiety   . Arthritis    Cervical DDD C5-6 and C6-7  . Atheroembolism   . Blue toe syndrome ([place])   . CAD (coronary artery disease)   . Cancer ([place])    skin BCC  Forehead and  SCC Left Lower Leg  . Chronic kidney disease, stage III (moderate)   . DDD (degenerative disc disease)   . Depression   . Gall stones   . Gallstones   . Heart murmur   . Heart murmur   . Hemorrhoids   . Hiatal hernia   . History of shingles   . Hyperlipidemia   . Hypertension   . Leg pain   . Mild incontinence   . Multinodular goiter   . Myocardial infarction ([place]) 03/15/95  . Osteopenia   . Peptic ulcer disease   . Stenosis of right carotid artery   . Stomach cancer ([place]) 1980  . Stroke ([place])   . Thyroid nodule   . Vision abnormalities     Past Surgical History:  Procedure Laterality Date  . 3 epidural steroid injections    . APPENDECTOMY    . Basal Cell Cancer Removal     forehead  . CATARACT EXTRACTION    . CATARACT EXTRACTION  12/29/2003  . CORONARY ANGIOPLASTY  03-15-1995  . CORONARY ANGIOPLASTY WITH STENT PLACEMENT  05-19-1998  . CORONARY STENT PLACEMENT  2000  . DILATION AND CURETTAGE OF UTERUS    . ENDARTERECTOMY Right 04/13/2015   Procedure: ENDARTERECTOMY CAROTID;  Surgeon: Serafina Mitchell, MD;  Location: [facility] OR;  Service: Vascular;  Laterality: Right;  . ENDOMETRIAL BIOPSY    . EYE SURGERY    . heart catherization    . HEMORRHOID SURGERY    . INCISIONAL HERNIA REPAIR    . INGUINAL HERNIA REPAIR    . LOOP RECORDER INSERTION N/A 06/24/2016   Procedure: Loop Recorder Insertion;  Surgeon: Deboraha Sprang, MD;  Location: [place] CV LAB;  Service: Cardiovascular;  Laterality: N/A;  . PATCH ANGIOPLASTY Right 04/13/2015   Procedure: PATCH ANGIOPLASTY;  Surgeon: Serafina Mitchell, MD;  Location: [facility] OR;  Service: Vascular;  Laterality: Right;  . PTCA    . SQUAMOUS CELL CARCINOMA EXCISION     left lower leg  . TEE WITHOUT CARDIOVERSION N/A 06/24/2016   Procedure: TRANSESOPHAGEAL ECHOCARDIOGRAM (TEE);  Surgeon: Dorothy Spark, MD;  Location: [facility] ENDOSCOPY;  Service: Cardiovascular;  Laterality: N/A;  . TONSILLECTOMY      Family History  Problem Relation Age of Onset  . Diabetes Father   . Prostate cancer Father        meta to colon  .  Cancer Father        Prostate  . Parkinsonism Mother   . Stroke Paternal Uncle   . Hypertension Paternal Uncle   . Heart attack Neg Hx     Social History   Socioeconomic History  . Marital status: Widowed    Spouse name: Not on file  . Number of children: 1  . Years of education: Not on file  . Highest education level: Not on file  Occupational History  . 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 Smoking status: Former Smoker    Packs/day: 1.00    Years: 54.00    Pack years: 54.00    Types: Cigarettes    Quit date: 10/05/1998    Years since quitting: 20.7  . Smokeless tobacco: Never Used  Substance and Sexual Activity  . Alcohol use: Not Currently    Alcohol/week: 0.0 standard drinks  . Drug use: Never  . Sexual activity: Not Currently  Other Topics Concern  . Not on file  Social History Narrative  . Not on file   Social Determinants of Health   Financial Resource  Strain:   . Difficulty of Paying Living Expenses:   Food Insecurity:   . Worried About Charity fundraiser in the Last Year:   . Arboriculturist in the Last Year:   Transportation Needs:   . Film/video editor (Medical):   Marland Kitchen Lack of Transportation (Non-Medical):   Physical Activity:   . Days of Exercise per Week:   . Minutes of Exercise per Session:   Stress:   . Feeling of Stress :   Social Connections:   . Frequency of Communication with Friends and Family:   . Frequency of Social Gatherings with Friends and Family:   . Attends Religious Services:   . Active Member of Clubs or Organizations:   . Attends Archivist Meetings:   Marland Kitchen Marital Status:   Intimate Partner Violence:   . Fear of Current or Ex-Partner:   . Emotionally Abused:   Marland Kitchen Physically Abused:   . Sexually Abused:     Outpatient Medications Prior to Visit  Medication Sig Dispense Refill  . ACCU-CHEK SMARTVIEW test strip 1 each by Other route as needed for other (blood sugar test strips).   11  . acetaminophen (TYLENOL) 325 MG tablet Take 325 mg by mouth every 6 (six) hours as needed for mild pain or moderate pain.    Marland Kitchen apixaban (ELIQUIS) 5 MG TABS tablet Take 1 tablet (5 mg total) by mouth 2 (two) times daily. 180 tablet 3  . azithromycin (ZITHROMAX) 250 MG tablet 2 tablets on day 1, then 1 tablet daily on days 2-4 6 tablet 0  . cetirizine (ZYRTEC) 10 MG tablet Take 10 mg by mouth daily.    . Cholecalciferol (VITAMIN D-3) 25 MCG (1000 UT) CAPS Take 1,000 Units by mouth 2 (two) times daily.    Marland Kitchen conjugated estrogens (PREMARIN) vaginal cream Place 1 g vaginally once a week. .625 mg once weekly    . diphenhydrAMINE (BENADRYL) 25 mg capsule Take 25 mg by mouth as needed for allergies.     Marland Kitchen docusate sodium (COLACE) 100 MG capsule Take 100 mg by mouth daily as needed.     . hydrochlorothiazide (MICROZIDE) 12.5 MG capsule Take 1 capsule (12.5 mg total) by mouth daily. 90 capsule 3  . Melatonin 10 MG TABS Take  10 mg by  mouth as needed.    . metFORMIN (GLUCOPHAGE) 500 MG tablet Take 500 mg by mouth 2 (two) times daily. '250MG'$  IN AM; '500MG'$  IN PM    . metoprolol succinate (TOPROL-XL) 25 MG 24 hr tablet Take 1 tablet (25 mg total) by mouth daily. May take extra 1/2 tablet as needed for breakthrough 90 tablet 3  . NEOMYCIN-POLYMYXIN-HYDROCORTISONE (CORTISPORIN) 1 % SOLN otic solution Place 2 drops into both ears as directed.   3  . nitroGLYCERIN (NITROSTAT) 0.4 MG SL tablet Place 1 tablet (0.4 mg total) under the tongue every 5 (five) minutes as needed for chest pain. 25 tablet 3  . predniSONE (DELTASONE) 5 MG tablet Take 1 tablet (5 mg total) by mouth daily with breakfast. Prednisone 6 pills first day, then 5 pills second day, then taper by one a day until gone 21 tablet 0  . rosuvastatin (CRESTOR) 10 MG tablet TAKE 1 TABLET BY MOUTH EVERY DAY 90 tablet 1  . sertraline (ZOLOFT) 50 MG tablet Take 50 mg by mouth daily.    . vitamin B-12 (CYANOCOBALAMIN) 1000 MCG tablet Take 1,000 mcg by mouth daily.     No facility-administered medications prior to visit.    Allergies  Allergen Reactions  . Iohexol Other (See Comments)     Desc: HIVES- 13 HR PRE-MEDS ARE REQUIRED-ASM- 03/21/05,SULFA,ADHESIVE TAPE   . Sulfonamide Derivatives Hives    REACTION: hives  . Latex Other (See Comments)    Adhesive tape and EKG adhesive leads to rash  . Tranilast Hives    Investigational medication  . Ivp Dye [Iodinated Diagnostic Agents]   . Tape     Adhesive tap    Review of Systems  Constitutional: Negative.   HENT: Negative.   Eyes: Negative.   Respiratory: Negative.   Cardiovascular: Negative.   Gastrointestinal: Negative.   Endocrine: Negative.   Genitourinary: Negative.   Musculoskeletal: Negative.   Skin: Negative.   Neurological: Negative.   Hematological: Negative.   Psychiatric/Behavioral: Negative.        Objective:    Physical Exam Vitals reviewed.  Constitutional:      Appearance: Normal  appearance.  HENT:     Head: Normocephalic.     Jaw: There is normal jaw occlusion.   Cardiovascular:     Rate and Rhythm: Normal rate and regular rhythm.     Pulses: Normal pulses.     Heart sounds: Normal heart sounds.  Pulmonary:     Effort: Pulmonary effort is normal.     Breath sounds: Normal breath sounds.  Abdominal:     General: Abdomen is flat.     Palpations: Abdomen is soft.  Musculoskeletal:        General: Normal range of motion.       Arms:     Cervical back: Normal range of motion and neck supple.     Right lower leg: Laceration (skin avulsion) present.  Skin:    Capillary Refill: Capillary refill takes less than 2 seconds.  Neurological:     General: No focal deficit present.     Mental Status: She is alert and oriented to person, place, and time. Mental status is at baseline.  Psychiatric:        Mood and Affect: Mood normal.        Behavior: Behavior normal.        Thought Content: Thought content normal.     BP 118/60 (BP Location: Right Arm, Patient Position: Sitting)   Pulse 75  Temp 97.8 F (36.6 C) (Temporal)   Resp 17   Ht '5\' 3"'$  (1.6 m)   Wt 155 lb 9.6 oz (70.6 kg)   SpO2 96%   BMI 27.56 kg/m  Wt Readings from Last 3 Encounters:  06/17/19 155 lb 9.6 oz (70.6 kg)  06/14/19 154 lb 3.2 oz (69.9 kg)  05/05/19 156 lb (70.8 kg)    Health Maintenance Due  Topic Date Due  . OPHTHALMOLOGY EXAM  Never done  . TETANUS/TDAP  Never done  . PNA vac Low Risk Adult (1 of 2 - PCV13) Never done    There are no preventive care reminders to display for this patient.   No results found for: TSH Lab Results  Component Value Date   WBC 8.2 04/19/2019   HGB 14.4 04/19/2019   HCT 43.0 04/19/2019   MCV 86 04/19/2019   PLT 196 04/19/2019   Lab Results  Component Value Date   NA 134 04/19/2019   K 3.9 04/19/2019   CO2 22 04/19/2019   GLUCOSE 144 (H) 04/19/2019   BUN 12 04/19/2019   CREATININE 1.09 (H) 04/19/2019   BILITOT 1.0 04/19/2019    ALKPHOS 64 04/19/2019   AST 38 04/19/2019   ALT 28 04/19/2019   PROT 6.1 04/19/2019   ALBUMIN 4.1 04/19/2019   CALCIUM 9.6 04/19/2019   ANIONGAP 9 08/29/2016   GFR 56.18 (L) 01/16/2012   Lab Results  Component Value Date   CHOL 125 04/19/2019   Lab Results  Component Value Date   HDL 50 04/19/2019   Lab Results  Component Value Date   LDLCALC 53 04/19/2019   Lab Results  Component Value Date   TRIG 125 04/19/2019   Lab Results  Component Value Date   CHOLHDL 2.4 06/23/2016   Lab Results  Component Value Date   HGBA1C 7.8 (H) 04/19/2019       Assessment & Plan:   Problem List Items Addressed This Visit      Musculoskeletal and Integument   Fracture of phalanx of left thumb    Patient has fracture left thumb and has seen orthopedic today with a splint applied.  She will follow up with them for this.      Relevant Orders   CT Head Wo Contrast     Other   Secondary hypercoagulable state [facility])    Patient is on chronic eliquis and she needs to be watched for any bleeding from head trauma.  CT ordered.      Relevant Orders   CT Head Wo Contrast   Fall due to tripping on loose carpet - Primary    Patient has fall on 5/34/2021 without LOC      Relevant Orders   CT Head Wo Contrast   Contusion, nose    She has a contusion to face that  is nor bleeding, septum is slightly deviated.      Relevant Orders   CT Head Wo Contrast   Open wound of right lower leg    Patient has small open wound on right lower leg  But not bleeding.  Perform local care and bandaging      Relevant Orders   CT Head Wo Contrast    Other Visit Diagnoses    Long term (current) use of anticoagulants       Relevant Orders   CT Head Wo Contrast       No orders of the defined types were placed in this encounter.    Purcell Nails  Henrene Pastor, MD

## 2019-06-17 NOTE — Assessment & Plan Note (Signed)
She has a contusion to face that  is nor bleeding, septum is slightly deviated.

## 2019-06-17 NOTE — Assessment & Plan Note (Signed)
Patient has fracture left thumb and has seen orthopedic today with a splint applied.  She will follow up with them for this.

## 2019-06-17 NOTE — Assessment & Plan Note (Signed)
Patient is on chronic eliquis and she needs to be watched for any bleeding from head trauma.  CT ordered.

## 2019-06-17 NOTE — Assessment & Plan Note (Signed)
Patient has fall on 5/34/2021 without LOC

## 2019-06-17 NOTE — Assessment & Plan Note (Signed)
Patient has small open wound on right lower leg  But not bleeding.  Perform local care and bandaging

## 2019-06-18 ENCOUNTER — Ambulatory Visit
Admission: RE | Admit: 2019-06-18 | Discharge: 2019-06-18 | Disposition: A | Discharge status: Home / Self Care | Payer: Medicare Other | Source: Ambulatory Visit | Attending: Legal Medicine | Admitting: Legal Medicine

## 2019-06-18 DIAGNOSIS — S0990XA Unspecified injury of head, initial encounter: Secondary | ICD-10-CM | POA: Diagnosis not present

## 2019-06-18 NOTE — Progress Notes (Signed)
No bleeding, no changes lp

## 2019-06-20 LAB — CUP PACEART REMOTE DEVICE CHECK
Date Time Interrogation Session: 20210509033955
Implantable Pulse Generator Implant Date: 20180514

## 2019-06-22 ENCOUNTER — Ambulatory Visit (INDEPENDENT_AMBULATORY_CARE_PROVIDER_SITE_OTHER): Payer: Medicare Other | Admitting: *Deleted

## 2019-06-22 DIAGNOSIS — I639 Cerebral infarction, unspecified: Secondary | ICD-10-CM

## 2019-06-23 NOTE — Progress Notes (Signed)
Carelink Summary Report / Loop Recorder 

## 2019-07-01 DIAGNOSIS — S62232D Other displaced fracture of base of first metacarpal bone, left hand, subsequent encounter for fracture with routine healing: Secondary | ICD-10-CM | POA: Diagnosis not present

## 2019-07-01 DIAGNOSIS — S62232A Other displaced fracture of base of first metacarpal bone, left hand, initial encounter for closed fracture: Secondary | ICD-10-CM | POA: Insufficient documentation

## 2019-07-01 DIAGNOSIS — M79642 Pain in left hand: Secondary | ICD-10-CM | POA: Diagnosis not present

## 2019-07-02 DIAGNOSIS — M1711 Unilateral primary osteoarthritis, right knee: Secondary | ICD-10-CM | POA: Diagnosis not present

## 2019-07-02 DIAGNOSIS — M1712 Unilateral primary osteoarthritis, left knee: Secondary | ICD-10-CM | POA: Diagnosis not present

## 2019-07-19 ENCOUNTER — Telehealth: Payer: Self-pay

## 2019-07-19 NOTE — Telephone Encounter (Signed)
Manual tranmission received from [facility].  Spoke with pt daughter, Stacy [name] (DPR on file).  She reports she sent transmission when pt moved to her house and she plugged in remote monitor.  Patient is asymptomatic, no current complaints.

## 2019-07-21 DIAGNOSIS — M1711 Unilateral primary osteoarthritis, right knee: Secondary | ICD-10-CM | POA: Diagnosis not present

## 2019-07-21 DIAGNOSIS — M1712 Unilateral primary osteoarthritis, left knee: Secondary | ICD-10-CM | POA: Diagnosis not present

## 2019-07-26 ENCOUNTER — Ambulatory Visit (INDEPENDENT_AMBULATORY_CARE_PROVIDER_SITE_OTHER): Payer: Medicare Other | Admitting: *Deleted

## 2019-07-26 DIAGNOSIS — I639 Cerebral infarction, unspecified: Secondary | ICD-10-CM | POA: Diagnosis not present

## 2019-07-27 LAB — CUP PACEART REMOTE DEVICE CHECK
Date Time Interrogation Session: 20210613232844
Implantable Pulse Generator Implant Date: 20180514

## 2019-07-27 NOTE — Progress Notes (Signed)
Carelink Summary Report / Loop Recorder 

## 2019-07-28 DIAGNOSIS — M1712 Unilateral primary osteoarthritis, left knee: Secondary | ICD-10-CM | POA: Diagnosis not present

## 2019-07-28 DIAGNOSIS — M1711 Unilateral primary osteoarthritis, right knee: Secondary | ICD-10-CM | POA: Diagnosis not present

## 2019-07-29 DIAGNOSIS — S62232D Other displaced fracture of base of first metacarpal bone, left hand, subsequent encounter for fracture with routine healing: Secondary | ICD-10-CM | POA: Diagnosis not present

## 2019-07-30 ENCOUNTER — Encounter: Payer: Self-pay | Admitting: Cardiovascular Disease

## 2019-07-30 ENCOUNTER — Other Ambulatory Visit: Payer: Self-pay

## 2019-07-30 ENCOUNTER — Ambulatory Visit (INDEPENDENT_AMBULATORY_CARE_PROVIDER_SITE_OTHER): Payer: Medicare Other | Admitting: Cardiovascular Disease

## 2019-07-30 VITALS — BP 118/68 | HR 64 | Ht 63.0 in | Wt 154.2 lb

## 2019-07-30 DIAGNOSIS — I10 Essential (primary) hypertension: Secondary | ICD-10-CM

## 2019-07-30 DIAGNOSIS — I639 Cerebral infarction, unspecified: Secondary | ICD-10-CM | POA: Diagnosis not present

## 2019-07-30 DIAGNOSIS — I2510 Atherosclerotic heart disease of native coronary artery without angina pectoris: Secondary | ICD-10-CM | POA: Diagnosis not present

## 2019-07-30 DIAGNOSIS — I480 Paroxysmal atrial fibrillation: Secondary | ICD-10-CM | POA: Diagnosis not present

## 2019-07-30 DIAGNOSIS — E782 Mixed hyperlipidemia: Secondary | ICD-10-CM | POA: Diagnosis not present

## 2019-07-30 DIAGNOSIS — R230 Cyanosis: Secondary | ICD-10-CM | POA: Diagnosis not present

## 2019-07-30 NOTE — Patient Instructions (Signed)
Medication Instructions:  Your provider recommends that you continue on your current medications as directed. Please refer to the Current Medication list given to you today.   *If you need a refill on your cardiac medications before your next appointment, please call your pharmacy*  Testing/Procedures: Your provider has requested that you have a lower extremity arterial duplex. During this test, ultrasound is used to evaluate arterial blood flow in the legs. Allow one hour for this exam. There are no restrictions or special instructions.    Follow-Up: Your provider recommends that you schedule a follow-up appointment in 6 months.

## 2019-07-30 NOTE — Progress Notes (Signed)
Cardiology Office Note:    Date:  07/30/2019   ID:  RUDELL [name], DOB September 23, 1926, MRN [id number]  PCP:  Lillard Anes, MD  [facility] Cardiologist:  Sherren Mocha, MD  [place] Electrophysiologist:  None   Referring MD: Lillard Anes,*   Chief Complaint  Patient presents with  . Coronary Artery Disease    History of Present Illness:    Stacy [name] is a 86 y.o. female with a hx of coronary artery disease and stroke, presenting for follow-up evaluation. The patient initially presented in 1997 with an anterior wall MI treated with balloon angioplasty of the LAD. She later underwent stenting of the RCA in 2000. Her most recent heart catheterization in 2008 demonstrated patency of her angioplasty and stent sites in the LAD and RCA, respectively. The patient had a stroke in 2018. A loop recorder demonstrated paroxysmal atrial fibrillation from which the patient has not had any associated symptoms. She is now anticoagulated with apixaban.  The patient is here with her daughter today.  She is doing pretty well.  She complains of some problems with her toes and feels like they are turning blue at times.  States that her feet get very cold.  She does not have any typical symptoms of calf claudication.  She has not had sores or ulceration on her toes.  She denies any chest pain, chest pressure, or dyspnea.  She denies leg swelling, orthopnea, or PND.  No recent heart palpitations.  Her device monitoring has demonstrated some episodes of atrial fibrillation but at a very low burden.  Past Medical History:  Diagnosis Date  . Anxiety   . Arthritis    Cervical DDD C5-6 and C6-7  . Atheroembolism   . Blue toe syndrome ([place])   . CAD (coronary artery disease)   . Cancer ([place])    skin BCC  Forehead and  SCC Left Lower Leg  . Chronic kidney disease, stage III (moderate)   . DDD (degenerative disc disease)   . Depression   . Gall stones   . Gallstones   . Heart  murmur   . Heart murmur   . Hemorrhoids   . Hiatal hernia   . History of shingles   . Hyperlipidemia   . Hypertension   . Leg pain   . Mild incontinence   . Multinodular goiter   . Myocardial infarction ([place]) 03/15/95  . Osteopenia   . Peptic ulcer disease   . Stenosis of right carotid artery   . Stomach cancer ([place]) 1980  . Stroke ([place])   . Thyroid nodule   . Vision abnormalities     Past Surgical History:  Procedure Laterality Date  . 3 epidural steroid injections    . APPENDECTOMY    . Basal Cell Cancer Removal     forehead  . CATARACT EXTRACTION    . CATARACT EXTRACTION  12/29/2003  . CORONARY ANGIOPLASTY  03-15-1995  . CORONARY ANGIOPLASTY WITH STENT PLACEMENT  05-19-1998  . CORONARY STENT PLACEMENT  2000  . DILATION AND CURETTAGE OF UTERUS    . ENDARTERECTOMY Right 04/13/2015   Procedure: ENDARTERECTOMY CAROTID;  Surgeon: Serafina Mitchell, MD;  Location[facility] OR;  Service: Vascular;  Laterality: Right;  . ENDOMETRIAL BIOPSY    . EYE SURGERY    . heart catherization    . HEMORRHOID SURGERY    . INCISIONAL HERNIA REPAIR    . INGUINAL HERNIA REPAIR    . LOOP RECORDER INSERTION N/A  06/24/2016   Procedure: Loop Recorder Insertion;  Surgeon: Deboraha Sprang, MD;  Location: [facility] LAB;  Service: Cardiovascular;  Laterality: N/A;  . PATCH ANGIOPLASTY Right 04/13/2015   Procedure: PATCH ANGIOPLASTY;  Surgeon: Serafina Mitchell, MD;  Location: [facility] OR;  Service: Vascular;  Laterality: Right;  . PTCA    . SQUAMOUS CELL CARCINOMA EXCISION     left lower leg  . TEE WITHOUT CARDIOVERSION N/A 06/24/2016   Procedure: TRANSESOPHAGEAL ECHOCARDIOGRAM (TEE);  Surgeon: Dorothy Spark, MD;  Location: [facility] ENDOSCOPY;  Service: Cardiovascular;  Laterality: N/A;  . TONSILLECTOMY      Current Medications: Current Meds  Medication Sig  . ACCU-CHEK SMARTVIEW test strip 1 each by Other route as needed for other (blood sugar test strips).   Marland Kitchen apixaban (ELIQUIS) 5 MG TABS tablet Take 1  tablet (5 mg total) by mouth 2 (two) times daily.  . Cholecalciferol (VITAMIN D-3) 25 MCG (1000 UT) CAPS Take 1,000 Units by mouth 2 (two) times daily.  Marland Kitchen conjugated estrogens (PREMARIN) vaginal cream Place 1 g vaginally once a week. .625 mg once weekly  . docusate sodium (COLACE) 100 MG capsule Take 100 mg by mouth daily as needed.   . hydrochlorothiazide (MICROZIDE) 12.5 MG capsule Take 1 capsule (12.5 mg total) by mouth daily.  . Melatonin 10 MG TABS Take 10 mg by mouth as needed.  . metFORMIN (GLUCOPHAGE) 500 MG tablet Take 250 mg by mouth daily with breakfast.  . metoprolol succinate (TOPROL-XL) 25 MG 24 hr tablet Take 1 tablet (25 mg total) by mouth daily. May take extra 1/2 tablet as needed for breakthrough  . NEOMYCIN-POLYMYXIN-HYDROCORTISONE (CORTISPORIN) 1 % SOLN otic solution Place 2 drops into both ears as directed.   . nitroGLYCERIN (NITROSTAT) 0.4 MG SL tablet Place 1 tablet (0.4 mg total) under the tongue every 5 (five) minutes as needed for chest pain.  Marland Kitchen omeprazole (PRILOSEC OTC) 20 MG tablet Take 20 mg by mouth daily as needed (reflux).  . rosuvastatin (CRESTOR) 10 MG tablet TAKE 1 TABLET BY MOUTH EVERY DAY  . sertraline (ZOLOFT) 50 MG tablet Take 50 mg by mouth daily.  . vitamin B-12 (CYANOCOBALAMIN) 1000 MCG tablet Take 1,000 mcg by mouth daily.     Allergies:   Iohexol, Sulfonamide derivatives, Latex, Tranilast, Ivp dye [iodinated diagnostic agents], and Tape   Social History   Socioeconomic History  . Marital status: Widowed    Spouse name: Not on file  . Number of children: 1  . Years of education: Not on file  . Highest education level: Not on file  Occupational History  . 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 Smoking status: Former Smoker    Packs/day: 1.00    Years: 54.00    Pack years: 54.00    Types: Cigarettes    Quit date: 10/05/1998    Years since quitting: 20.8  . Smokeless tobacco: Never Used  Vaping  Use  . Vaping Use: Never used  Substance and Sexual Activity  . Alcohol use: Not Currently    Alcohol/week: 0.0 standard drinks  . Drug use: Never  . Sexual activity: Not Currently  Other Topics Concern  . Not on file  Social History Narrative  . Not on file   Social Determinants of Health   Financial Resource Strain:   . Difficulty of Paying Living Expenses:   Food Insecurity:   . Worried About [employer] of  Food in the Last Year:   . [place] in the Last Year:   Transportation Needs:   . Film/video editor (Medical):   Marland Kitchen Lack of Transportation (Non-Medical):   Physical Activity:   . Days of Exercise per Week:   . Minutes of Exercise per Session:   Stress:   . Feeling of Stress :   Social Connections:   . Frequency of Communication with Friends and Family:   . Frequency of Social Gatherings with Friends and Family:   . Attends Religious Services:   . Active Member of Clubs or Organizations:   . Attends Archivist Meetings:   Marland Kitchen Marital Status:      Family History: The patient's family history includes Cancer in her father; Diabetes in her father; Hypertension in her paternal uncle; Parkinsonism in her mother; Prostate cancer in her father; Stroke in her paternal uncle. There is no history of Heart attack.  ROS:   Please see the history of present illness.    Positive for low back pain, cold feet, blue discoloration of toes.  All other systems reviewed and are negative.  EKGs/Labs/Other Studies Reviewed:    EKG:  EKG is not ordered today.    Recent Labs: 04/19/2019: ALT 28; BUN 12; Creatinine, Ser 1.09; Hemoglobin 14.4; Platelets 196; Potassium 3.9; Sodium 134  Recent Lipid Panel    Component Value Date/Time   CHOL 125 04/19/2019 1008   TRIG 125 04/19/2019 1008   HDL 50 04/19/2019 1008   CHOLHDL 2.4 06/23/2016 0441   VLDL 23 06/23/2016 0441   LDLCALC 53 04/19/2019 1008    Physical Exam:    VS:  BP 118/68 (BP Location: Right Arm, Patient  Position: Sitting, Cuff Size: Normal)   Pulse 64   Ht 5\' 3"  (1.6 m)   Wt 154 lb 3.2 oz (69.9 kg)   SpO2 93%   BMI 27.32 kg/m     Wt Readings from Last 3 Encounters:  07/30/19 154 lb 3.2 oz (69.9 kg)  06/17/19 155 lb 9.6 oz (70.6 kg)  06/14/19 154 lb 3.2 oz (69.9 kg)     GEN:  Well nourished, well developed elderly woman in no acute distress HEENT: Normal NECK: No JVD; No carotid bruits LYMPHATICS: No lymphadenopathy CARDIAC: RRR, no murmurs, rubs, gallops RESPIRATORY:  Clear to auscultation without rales, wheezing or rhonchi  ABDOMEN: Soft, non-tender, non-distended MUSCULOSKELETAL:  No edema; No deformity. Pedal pulses present but diminished at 1+ bilaterally SKIN: Warm and dry NEUROLOGIC:  Alert and oriented x 3 PSYCHIATRIC:  Normal affect   ASSESSMENT:    1. Paroxysmal atrial fibrillation (HCC)   2. Coronary artery disease involving native coronary artery of native heart without angina pectoris   3. Mixed hyperlipidemia   4. Essential hypertension    PLAN:    In order of problems listed above:  1. Low A. fib burden noted on device interrogation.  Tolerating apixaban without bleeding problems.  Remains asymptomatic.  Continue metoprolol succinate. 2. Doing well with no symptoms of angina.  On appropriate medical therapy with anticoagulation, beta-blocker, and a statin drug. 3. Treated with rosuvastatin.  Lipids are excellent with a cholesterol 125, HDL 50, LDL 61.  ALT is normal at 28. 4. Blood pressure is well controlled with hydrochlorothiazide and metoprolol.  Overall the patient appears to be doing well. Will check a lower extremity doppler to assess blue toes. Will check into whether maintenance cardiac rehab has resumed at her request. I'll see her back  in 6 months.   Medication Adjustments/Labs and Tests Ordered: Current medicines are reviewed at length with the patient today.  Concerns regarding medicines are outlined above.  No orders of the defined types  were placed in this encounter.  No orders of the defined types were placed in this encounter.   There are no Patient Instructions on file for this visit.   Signed, Sherren Mocha, MD  07/30/2019 3:05 PM    [place]

## 2019-08-12 ENCOUNTER — Other Ambulatory Visit: Payer: Self-pay | Admitting: Cardiovascular Disease

## 2019-08-12 DIAGNOSIS — R230 Cyanosis: Secondary | ICD-10-CM

## 2019-08-16 ENCOUNTER — Other Ambulatory Visit: Payer: Self-pay | Admitting: Cardiovascular Disease

## 2019-08-16 DIAGNOSIS — E785 Hyperlipidemia, unspecified: Secondary | ICD-10-CM

## 2019-08-16 DIAGNOSIS — I2510 Atherosclerotic heart disease of native coronary artery without angina pectoris: Secondary | ICD-10-CM

## 2019-08-19 ENCOUNTER — Other Ambulatory Visit: Payer: Self-pay

## 2019-08-19 ENCOUNTER — Ambulatory Visit (HOSPITAL_COMMUNITY)
Admission: RE | Admit: 2019-08-19 | Discharge: 2019-08-19 | Disposition: A | Discharge status: Home / Self Care | Payer: Medicare Other | Source: Ambulatory Visit | Attending: Cardiology | Admitting: Cardiology

## 2019-08-19 DIAGNOSIS — R230 Cyanosis: Secondary | ICD-10-CM

## 2019-08-23 ENCOUNTER — Other Ambulatory Visit: Payer: Medicare Other

## 2019-08-25 ENCOUNTER — Ambulatory Visit: Payer: Medicare Other | Admitting: Legal Medicine

## 2019-08-25 DIAGNOSIS — M1712 Unilateral primary osteoarthritis, left knee: Secondary | ICD-10-CM | POA: Diagnosis not present

## 2019-08-25 DIAGNOSIS — M1711 Unilateral primary osteoarthritis, right knee: Secondary | ICD-10-CM | POA: Diagnosis not present

## 2019-08-26 DIAGNOSIS — S62232D Other displaced fracture of base of first metacarpal bone, left hand, subsequent encounter for fracture with routine healing: Secondary | ICD-10-CM | POA: Diagnosis not present

## 2019-08-30 ENCOUNTER — Ambulatory Visit (INDEPENDENT_AMBULATORY_CARE_PROVIDER_SITE_OTHER): Payer: Medicare Other | Admitting: *Deleted

## 2019-08-30 DIAGNOSIS — I639 Cerebral infarction, unspecified: Secondary | ICD-10-CM

## 2019-08-30 LAB — CUP PACEART REMOTE DEVICE CHECK
Date Time Interrogation Session: 20210718230646
Implantable Pulse Generator Implant Date: 20180514

## 2019-08-31 ENCOUNTER — Other Ambulatory Visit: Payer: Self-pay | Admitting: Legal Medicine

## 2019-08-31 ENCOUNTER — Other Ambulatory Visit: Payer: Self-pay

## 2019-08-31 ENCOUNTER — Other Ambulatory Visit: Payer: Medicare Other

## 2019-08-31 DIAGNOSIS — E1122 Type 2 diabetes mellitus with diabetic chronic kidney disease: Secondary | ICD-10-CM

## 2019-08-31 DIAGNOSIS — E1169 Type 2 diabetes mellitus with other specified complication: Secondary | ICD-10-CM | POA: Diagnosis not present

## 2019-08-31 DIAGNOSIS — I10 Essential (primary) hypertension: Secondary | ICD-10-CM

## 2019-08-31 DIAGNOSIS — N1831 Chronic kidney disease, stage 3a: Secondary | ICD-10-CM

## 2019-08-31 DIAGNOSIS — E669 Obesity, unspecified: Secondary | ICD-10-CM | POA: Diagnosis not present

## 2019-08-31 DIAGNOSIS — E782 Mixed hyperlipidemia: Secondary | ICD-10-CM

## 2019-08-31 DIAGNOSIS — I152 Hypertension secondary to endocrine disorders: Secondary | ICD-10-CM

## 2019-08-31 DIAGNOSIS — E1159 Type 2 diabetes mellitus with other circulatory complications: Secondary | ICD-10-CM | POA: Diagnosis not present

## 2019-09-01 DIAGNOSIS — M1711 Unilateral primary osteoarthritis, right knee: Secondary | ICD-10-CM | POA: Diagnosis not present

## 2019-09-01 DIAGNOSIS — M1712 Unilateral primary osteoarthritis, left knee: Secondary | ICD-10-CM | POA: Diagnosis not present

## 2019-09-01 LAB — CBC WITH DIFFERENTIAL/PLATELET
Basophils Absolute: 0.1 10*3/uL (ref 0.0–0.2)
Basos: 1 %
EOS (ABSOLUTE): 0.2 10*3/uL (ref 0.0–0.4)
Eos: 3 %
Hematocrit: 44.2 % (ref 34.0–46.6)
Hemoglobin: 14.5 g/dL (ref 11.1–15.9)
Immature Grans (Abs): 0 10*3/uL (ref 0.0–0.1)
Immature Granulocytes: 0 %
Lymphocytes Absolute: 2.3 10*3/uL (ref 0.7–3.1)
Lymphs: 31 %
MCH: 28.9 pg (ref 26.6–33.0)
MCHC: 32.8 g/dL (ref 31.5–35.7)
MCV: 88 fL (ref 79–97)
Monocytes Absolute: 0.6 10*3/uL (ref 0.1–0.9)
Monocytes: 8 %
Neutrophils Absolute: 4.3 10*3/uL (ref 1.4–7.0)
Neutrophils: 57 %
Platelets: 178 10*3/uL (ref 150–450)
RBC: 5.02 x10E6/uL (ref 3.77–5.28)
RDW: 13.9 % (ref 11.7–15.4)
WBC: 7.5 10*3/uL (ref 3.4–10.8)

## 2019-09-01 LAB — COMPREHENSIVE METABOLIC PANEL
ALT: 26 IU/L (ref 0–32)
AST: 40 IU/L (ref 0–40)
Albumin/Globulin Ratio: 1.8 (ref 1.2–2.2)
Albumin: 3.9 g/dL (ref 3.5–4.6)
Alkaline Phosphatase: 70 IU/L (ref 48–121)
BUN/Creatinine Ratio: 10 — ABNORMAL LOW (ref 12–28)
BUN: 11 mg/dL (ref 10–36)
Bilirubin Total: 0.8 mg/dL (ref 0.0–1.2)
CO2: 24 mmol/L (ref 20–29)
Calcium: 9.2 mg/dL (ref 8.7–10.3)
Chloride: 100 mmol/L (ref 96–106)
Creatinine, Ser: 1.05 mg/dL — ABNORMAL HIGH (ref 0.57–1.00)
GFR calc Af Amer: 54 mL/min/{1.73_m2} — ABNORMAL LOW (ref >59)
GFR calc non Af Amer: 47 mL/min/{1.73_m2} — ABNORMAL LOW (ref >59)
Globulin, Total: 2.2 g/dL (ref 1.5–4.5)
Glucose: 159 mg/dL — ABNORMAL HIGH (ref 65–99)
Potassium: 3.8 mmol/L (ref 3.5–5.2)
Sodium: 139 mmol/L (ref 134–144)
Total Protein: 6.1 g/dL (ref 6.0–8.5)

## 2019-09-01 LAB — CARDIOVASCULAR RISK ASSESSMENT

## 2019-09-01 LAB — LIPID PANEL
Chol/HDL Ratio: 2.8 ratio (ref 0.0–4.4)
Cholesterol, Total: 125 mg/dL (ref 100–199)
HDL: 44 mg/dL (ref >39)
LDL Chol Calc (NIH): 56 mg/dL (ref 0–99)
Triglycerides: 146 mg/dL (ref 0–149)
VLDL Cholesterol Cal: 25 mg/dL (ref 5–40)

## 2019-09-01 LAB — HEMOGLOBIN A1C
Est. average glucose Bld gHb Est-mCnc: 194 mg/dL
Hgb A1c MFr Bld: 8.4 % — ABNORMAL HIGH (ref 4.8–5.6)

## 2019-09-01 NOTE — Progress Notes (Signed)
Carelink Summary Report / Loop Recorder 

## 2019-09-01 NOTE — Progress Notes (Signed)
CBC normal, glucose 159, kidney tests stable, livr tests normal, Cholesterol normal, A1c 8.4 high may have to add another medicine or watch diet lp

## 2019-09-02 ENCOUNTER — Ambulatory Visit (INDEPENDENT_AMBULATORY_CARE_PROVIDER_SITE_OTHER): Payer: Medicare Other | Admitting: Legal Medicine

## 2019-09-02 ENCOUNTER — Other Ambulatory Visit: Payer: Self-pay

## 2019-09-02 ENCOUNTER — Encounter: Payer: Self-pay | Admitting: Legal Medicine

## 2019-09-02 VITALS — BP 110/64 | HR 64 | Temp 97.4°F | Resp 17 | Ht 63.0 in | Wt 152.6 lb

## 2019-09-02 DIAGNOSIS — E1159 Type 2 diabetes mellitus with other circulatory complications: Secondary | ICD-10-CM | POA: Diagnosis not present

## 2019-09-02 DIAGNOSIS — E559 Vitamin D deficiency, unspecified: Secondary | ICD-10-CM | POA: Diagnosis not present

## 2019-09-02 DIAGNOSIS — I10 Essential (primary) hypertension: Secondary | ICD-10-CM

## 2019-09-02 DIAGNOSIS — E669 Obesity, unspecified: Secondary | ICD-10-CM

## 2019-09-02 DIAGNOSIS — N1831 Chronic kidney disease, stage 3a: Secondary | ICD-10-CM

## 2019-09-02 DIAGNOSIS — Z7901 Long term (current) use of anticoagulants: Secondary | ICD-10-CM

## 2019-09-02 DIAGNOSIS — H353132 Nonexudative age-related macular degeneration, bilateral, intermediate dry stage: Secondary | ICD-10-CM | POA: Diagnosis not present

## 2019-09-02 DIAGNOSIS — D510 Vitamin B12 deficiency anemia due to intrinsic factor deficiency: Secondary | ICD-10-CM | POA: Diagnosis not present

## 2019-09-02 DIAGNOSIS — H906 Mixed conductive and sensorineural hearing loss, bilateral: Secondary | ICD-10-CM

## 2019-09-02 DIAGNOSIS — E1169 Type 2 diabetes mellitus with other specified complication: Secondary | ICD-10-CM | POA: Diagnosis not present

## 2019-09-02 DIAGNOSIS — E782 Mixed hyperlipidemia: Secondary | ICD-10-CM | POA: Diagnosis not present

## 2019-09-02 DIAGNOSIS — E569 Vitamin deficiency, unspecified: Secondary | ICD-10-CM

## 2019-09-02 NOTE — Progress Notes (Signed)
Subjective:  Patient ID: Stacy [name], female    DOB: [birth year]  Age: 85 y.o. MRN: [id number]  Chief Complaint  Patient presents with  . Diabetes  . Hyperlipidemia  . Hypertension    HPI: chronic visit  Patient present with type 2 diabetes.  Specifically, this is type 2, noninsulin requiring diabetes, complicated by hypertension and hyperlipidemia.  Compliance with treatment has been good; patient take medicines as directed, maintains diet and exercise regimen, follows up as directed, and is keeping glucose diary.  Date of  diagnosis 2010.  Depression screen has been performed.Tobacco screen nonsmoker. Current medicines for diabetes metformin.  Patient is on none for renal protection and crestaor for cholesterol control.  Patient performs foot exams daily and last ophthalmologic exam was next week.  Patient presents with hyperlipidemia.  Compliance with treatment has been good; patient takes medicines as directed, maintains low cholesterol diet, follows up as directed, and maintains exercise regimen.  Patient is using crestor without problems.  Patient presents for follow up of hypertension.  Patient tolerating HCTZ and metoprolol well with side effects.  Patient was diagnosed with hypertension 2010 so has been treated for hypertension for 10 years.Patient is working on maintaining diet and exercise regimen and follows up as directed. Complication include none.   Current Outpatient Medications on File Prior to Visit  Medication Sig Dispense Refill  . ACCU-CHEK SMARTVIEW test strip 1 each by Other route as needed for other (blood sugar test strips).   11  . Cholecalciferol (VITAMIN D-3) 25 MCG (1000 UT) CAPS Take 1,000 Units by mouth 2 (two) times daily.    Marland Kitchen conjugated estrogens (PREMARIN) vaginal cream Place 1 g vaginally once a week. .625 mg once weekly    . docusate sodium (COLACE) 100 MG capsule Take 100 mg by mouth daily as needed.     . Melatonin 10 MG TABS Take 10 mg by mouth as  needed.    . metFORMIN (GLUCOPHAGE) 500 MG tablet Take 250 mg by mouth daily with breakfast.    . metoprolol succinate (TOPROL-XL) 25 MG 24 hr tablet Take 1 tablet (25 mg total) by mouth daily. May take extra 1/2 tablet as needed for breakthrough 90 tablet 3  . NEOMYCIN-POLYMYXIN-HYDROCORTISONE (CORTISPORIN) 1 % SOLN otic solution Place 2 drops into both ears as directed.   3  . nitroGLYCERIN (NITROSTAT) 0.4 MG SL tablet Place 1 tablet (0.4 mg total) under the tongue every 5 (five) minutes as needed for chest pain. 25 tablet 3  . omeprazole (PRILOSEC OTC) 20 MG tablet Take 20 mg by mouth daily as needed (reflux).    . rosuvastatin (CRESTOR) 10 MG tablet TAKE 1 TABLET BY MOUTH EVERY DAY 90 tablet 3  . sertraline (ZOLOFT) 50 MG tablet Take 50 mg by mouth daily.    . vitamin B-12 (CYANOCOBALAMIN) 1000 MCG tablet Take 1,000 mcg by mouth daily.    Marland Kitchen apixaban (ELIQUIS) 5 MG TABS tablet Take 1 tablet (5 mg total) by mouth 2 (two) times daily. 180 tablet 3  . hydrochlorothiazide (MICROZIDE) 12.5 MG capsule Take 1 capsule (12.5 mg total) by mouth daily. 90 capsule 3   No current facility-administered medications on file prior to visit.   Past Medical History:  Diagnosis Date  . Anxiety   . Arthritis    Cervical DDD C5-6 and C6-7  . Atheroembolism   . Blue toe syndrome ([place])   . CAD (coronary artery disease)   . Cancer ([place])    skin BCC  Forehead and  SCC Left Lower Leg  . Chronic kidney disease, stage III (moderate)   . DDD (degenerative disc disease)   . Depression   . Gall stones   . Gallstones   . Heart murmur   . Heart murmur   . Hemorrhoids   . Hiatal hernia   . History of shingles   . Hyperlipidemia   . Hypertension   . Leg pain   . Mild incontinence   . Multinodular goiter   . Myocardial infarction ([place]) 03/15/95  . Osteopenia   . Peptic ulcer disease   . Stenosis of right carotid artery   . Stomach cancer ([place]) 1980  . Stroke ([place])   . Thyroid nodule   . Vision  abnormalities    Past Surgical History:  Procedure Laterality Date  . 3 epidural steroid injections    . APPENDECTOMY    . Basal Cell Cancer Removal     forehead  . CATARACT EXTRACTION    . CATARACT EXTRACTION  12/29/2003  . CORONARY ANGIOPLASTY  03-15-1995  . CORONARY ANGIOPLASTY WITH STENT PLACEMENT  05-19-1998  . CORONARY STENT PLACEMENT  2000  . DILATION AND CURETTAGE OF UTERUS    . ENDARTERECTOMY Right 04/13/2015   Procedure: ENDARTERECTOMY CAROTID;  Surgeon: Serafina Mitchell, MD;  Location: [facility] OR;  Service: Vascular;  Laterality: Right;  . ENDOMETRIAL BIOPSY    . EYE SURGERY    . heart catherization    . HEMORRHOID SURGERY    . INCISIONAL HERNIA REPAIR    . INGUINAL HERNIA REPAIR    . LOOP RECORDER INSERTION N/A 06/24/2016   Procedure: Loop Recorder Insertion;  Surgeon: Deboraha Sprang, MD;  Location: [place] [facility] CV LAB;  Service: Cardiovascular;  Laterality: N/A;  . PATCH ANGIOPLASTY Right 04/13/2015   Procedure: PATCH ANGIOPLASTY;  Surgeon: Serafina Mitchell, MD;  Location: [facility] OR;  Service: Vascular;  Laterality: Right;  . PTCA    . SQUAMOUS CELL CARCINOMA EXCISION     left lower leg  . TEE WITHOUT CARDIOVERSION N/A 06/24/2016   Procedure: TRANSESOPHAGEAL ECHOCARDIOGRAM (TEE);  Surgeon: Dorothy Spark, MD;  Location: [facility] ENDOSCOPY;  Service: Cardiovascular;  Laterality: N/A;  . TONSILLECTOMY      Family History  Problem Relation Age of Onset  . Diabetes Father   . Prostate cancer Father        meta to colon  . Cancer Father        Prostate  . Parkinsonism Mother   . Stroke Paternal Uncle   . Hypertension Paternal Uncle   . Heart attack Neg Hx    Social History   Socioeconomic History  . Marital status: Widowed    Spouse name: Not on file  . Number of children: 1  . Years of education: Not on file  . Highest education level: Not on file  Occupational History  . 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 Smoking status:  Former Smoker    Packs/day: 1.00    Years: 54.00    Pack years: 54.00    Types: Cigarettes    Quit date: 10/05/1998    Years since quitting: 20.9  . Smokeless tobacco: Never Used  Vaping Use  . Vaping Use: Never used  Substance and Sexual Activity  . Alcohol use: Not Currently    Alcohol/week: 0.0 standard drinks  . Drug use: Never  . Sexual activity: Not Currently  Other Topics Concern  .  Not on file  Social History Narrative  . Not on file   Social Determinants of Health   Financial Resource Strain:   . Difficulty of Paying Living Expenses:   Food Insecurity:   . Worried About Charity fundraiser in the Last Year:   . Arboriculturist in the Last Year:   Transportation Needs:   . Film/video editor (Medical):   Marland Kitchen Lack of Transportation (Non-Medical):   Physical Activity:   . Days of Exercise per Week:   . Minutes of Exercise per Session:   Stress:   . Feeling of Stress :   Social Connections:   . Frequency of Communication with Friends and Family:   . Frequency of Social Gatherings with Friends and Family:   . Attends Religious Services:   . Active Member of Clubs or Organizations:   . Attends Archivist Meetings:   Marland Kitchen Marital Status:     Review of Systems  Constitutional: Negative.   HENT: Negative.   Eyes: Negative.   Respiratory: Negative.   Cardiovascular: Negative.   Gastrointestinal: Negative.   Endocrine: Negative.   Genitourinary: Negative.   Musculoskeletal: Negative.   Skin: Negative.   Neurological: Positive for weakness.  Psychiatric/Behavioral: Negative.      Objective:  BP (!) 110/64 (BP Location: Right Arm, Patient Position: Sitting)   Pulse 64   Temp (!) 97.4 F (36.3 C) (Temporal)   Resp 17   Ht 5\' 3"  (1.6 m)   Wt 152 lb 9.6 oz (69.2 kg)   SpO2 95%   BMI 27.03 kg/m   BP/Weight 09/02/2019 7/90/2409 08/14/5327  Systolic BP 924 268 341  Diastolic BP 64 68 60  Wt. (Lbs) 152.6 154.2 155.6  BMI 27.03 27.32 27.56     Physical Exam Vitals reviewed.  Constitutional:      Appearance: Normal appearance.  HENT:     Head: Atraumatic.     Right Ear: Tympanic membrane, ear canal and external ear normal.     Left Ear: Tympanic membrane, ear canal and external ear normal.     Nose: Nose normal.     Mouth/Throat:     Mouth: Mucous membranes are moist.     Pharynx: Oropharynx is clear.  Eyes:     Extraocular Movements: Extraocular movements intact.     Conjunctiva/sclera: Conjunctivae normal.     Pupils: Pupils are equal, round, and reactive to light.  Cardiovascular:     Rate and Rhythm: Normal rate and regular rhythm.     Pulses: Normal pulses.     Heart sounds: Normal heart sounds.  Pulmonary:     Effort: Pulmonary effort is normal.     Breath sounds: Normal breath sounds.  Abdominal:     General: Abdomen is flat. Bowel sounds are normal.     Palpations: Abdomen is soft.  Musculoskeletal:        General: Normal range of motion.     Cervical back: Normal range of motion and neck supple.  Skin:    General: Skin is warm and dry.     Capillary Refill: Capillary refill takes less than 2 seconds.  Neurological:     General: No focal deficit present.     Mental Status: She is alert and oriented to person, place, and time. Mental status is at baseline.  Psychiatric:        Mood and Affect: Mood normal.        Behavior: Behavior normal.  Thought Content: Thought content normal.        Judgment: Judgment normal.     Diabetic Foot Exam - Simple   Simple Foot Form Diabetic Foot exam was performed with the following findings: Yes 09/02/2019  3:02 PM  Visual Inspection No deformities, no ulcerations, no other skin breakdown bilaterally: Yes Sensation Testing Intact to touch and monofilament testing bilaterally: Yes Pulse Check Posterior Tibialis and Dorsalis pulse intact bilaterally: Yes Comments      Lab Results  Component Value Date   WBC 7.5 08/31/2019   HGB 14.5 08/31/2019    HCT 44.2 08/31/2019   PLT 178 08/31/2019   GLUCOSE 159 (H) 08/31/2019   CHOL 125 08/31/2019   TRIG 146 08/31/2019   HDL 44 08/31/2019   LDLCALC 56 08/31/2019   ALT 26 08/31/2019   AST 40 08/31/2019   NA 139 08/31/2019   K 3.8 08/31/2019   CL 100 08/31/2019   CREATININE 1.05 (H) 08/31/2019   BUN 11 08/31/2019   CO2 24 08/31/2019   INR 1.06 04/05/2015   HGBA1C 8.4 (H) 08/31/2019   MICROALBUR 10 04/21/2019      Assessment & Plan:   1. Mixed hyperlipidemia - Lipid Panel - TSH AN INDIVIDUAL CARE PLAN for hyperlipidemia/ cholesterol was established and reinforced today.  The patient's status was assessed using clinical findings on exam, lab and other diagnostic tests. The patient's disease status was assessed based on evidence-based guidelines and found to be well controlled. MEDICATIONS were reviewed. SELF MANAGEMENT GOALS have been discussed and patient's success at attaining the goal of low cholesterol was assessed. RECOMMENDATION given include regular exercise 3 days a week and low cholesterol/low fat diet. CLINICAL SUMMARY including written plan to identify barriers unique to the patient due to social or economic  reasons was discussed.  2. Chronic kidney disease, stage 3a AN INDIVIDUAL CARE PLAN for chronic renal disease was established and reinforced today.  The patient's status was assessed using clinical findings on exam, labs, and other diagnostic testing. Patient's success at meeting treatment goals based on disease specific evidence-bassed guidelines and found to be in good control. RECOMMENDATIONS include maintaining present medicines and treatment.  3. Essential hypertension An individual hypertension care plan was established and reinforced today.  The patient's status was assessed using clinical findings on exam and labs or diagnostic tests. The patient's success at meeting treatment goals on disease specific evidence-based guidelines and found to be well controlled.  We discussed bP control. SELF MANAGEMENT: The patient and I together assessed ways to personally work towards obtaining the recommended goals. RECOMMENDATIONS: avoid decongestants found in common cold remedies, decrease consumption of alcohol, perform routine monitoring of BP with home BP cuff, exercise, reduction of dietary salt, take medicines as prescribed, try not to miss doses and quit smoking.  Regular exercise and maintaining a healthy weight is needed.  Stress reduction may help. A CLINICAL SUMMARY including written plan identify barriers to care unique to individual due to social or financial issues.  We attempt to mutually creat solutions for individual and family understanding.  4. Obesity, diabetes, and hypertension syndrome (HCC) - CBC with Differential/Platelet - Comprehensive metabolic panel An individual care plan for diabetes was established and reinforced today.  The patient's status was assessed using clinical findings on exam, labs and diagnostic testing. Patient success at meeting goals based on disease specific evidence-based guidelines and found to be good controlled. Medications were assessed and patient's understanding of the medical issues , including barriers were assessed.  Recommend adherence to a diabetic diet, a graduated exercise program, HgbA1c level is checked quarterly, and urine microalbumin performed yearly .  Annual mono-filament sensation testing performed. Lower blood pressure and control hyperlipidemia is important. Get annual eye exams and annual flu shots and smoking cessation discussed.  Self management goals were discussed.  5. Long term (current) use of anticoagulants Patient is on long term anticoagulants with bleeding, continue on medicines  6. Mixed conductive and sensorineural hearing loss, bilateral She is using hearing aids  7. Intermediate stage nonexudative age-related macular degeneration of both eyes She is seeing ophthalmologist for this,  macular degeneration is stable  8. Vitamin deficiency - Vitamin D, 25-hydroxy Vitamin D supplementation was discussed  All blood work for this visit was drawn the day before         Follow-up: Return in about 4 months (around 01/03/2020) for fasting mcr pe kim.  An After Visit Summary was printed and given to the patient.  [place] [phone]

## 2019-09-03 LAB — TSH: TSH: 1.73 u[IU]/mL (ref 0.450–4.500)

## 2019-09-03 LAB — VITAMIN B12: Vitamin B-12: 555 pg/mL (ref 232–1245)

## 2019-09-03 LAB — VITAMIN D 25 HYDROXY (VIT D DEFICIENCY, FRACTURES): Vit D, 25-Hydroxy: 44.6 ng/mL (ref 30.0–100.0)

## 2019-09-03 NOTE — Progress Notes (Signed)
TSH 1.7 normal, vitamin D 44.6 normal range, B12 555 normal range lp

## 2019-09-07 DIAGNOSIS — H9313 Tinnitus, bilateral: Secondary | ICD-10-CM | POA: Diagnosis not present

## 2019-09-07 DIAGNOSIS — H838X3 Other specified diseases of inner ear, bilateral: Secondary | ICD-10-CM | POA: Diagnosis not present

## 2019-09-07 DIAGNOSIS — R0982 Postnasal drip: Secondary | ICD-10-CM | POA: Diagnosis not present

## 2019-09-07 DIAGNOSIS — H903 Sensorineural hearing loss, bilateral: Secondary | ICD-10-CM | POA: Diagnosis not present

## 2019-09-09 DIAGNOSIS — M1711 Unilateral primary osteoarthritis, right knee: Secondary | ICD-10-CM | POA: Diagnosis not present

## 2019-09-09 DIAGNOSIS — M1712 Unilateral primary osteoarthritis, left knee: Secondary | ICD-10-CM | POA: Diagnosis not present

## 2019-09-10 ENCOUNTER — Other Ambulatory Visit: Payer: Self-pay | Admitting: Cardiovascular Disease

## 2019-09-13 ENCOUNTER — Other Ambulatory Visit: Payer: Self-pay | Admitting: Cardiovascular Disease

## 2019-09-13 NOTE — Telephone Encounter (Signed)
Age 86, weight 69kg, SCr 1.05 on 08/31/19, last OV June 2021, afib indication

## 2019-09-14 NOTE — Telephone Encounter (Signed)
Prescription refill request for Eliquis received.  Last office visit: 6/18/2021[name] Scr: 1.05, 08/31/2019 Age: 86 y.o. Weight: 69.2 kg   Prescription refill sent.

## 2019-09-15 DIAGNOSIS — M1712 Unilateral primary osteoarthritis, left knee: Secondary | ICD-10-CM | POA: Diagnosis not present

## 2019-09-15 DIAGNOSIS — M1711 Unilateral primary osteoarthritis, right knee: Secondary | ICD-10-CM | POA: Diagnosis not present

## 2019-09-20 ENCOUNTER — Other Ambulatory Visit: Payer: Self-pay | Admitting: Cardiovascular Disease

## 2019-09-20 DIAGNOSIS — I2510 Atherosclerotic heart disease of native coronary artery without angina pectoris: Secondary | ICD-10-CM

## 2019-09-21 DIAGNOSIS — M1711 Unilateral primary osteoarthritis, right knee: Secondary | ICD-10-CM | POA: Diagnosis not present

## 2019-09-21 DIAGNOSIS — M1712 Unilateral primary osteoarthritis, left knee: Secondary | ICD-10-CM | POA: Diagnosis not present

## 2019-10-02 LAB — CUP PACEART REMOTE DEVICE CHECK
Date Time Interrogation Session: 20210818232728
Implantable Pulse Generator Implant Date: 20180514

## 2019-10-04 ENCOUNTER — Ambulatory Visit (INDEPENDENT_AMBULATORY_CARE_PROVIDER_SITE_OTHER): Payer: Medicare Other | Admitting: *Deleted

## 2019-10-04 DIAGNOSIS — I639 Cerebral infarction, unspecified: Secondary | ICD-10-CM | POA: Diagnosis not present

## 2019-10-11 NOTE — Progress Notes (Signed)
Carelink Summary Report / Loop Recorder 

## 2019-10-25 ENCOUNTER — Telehealth: Payer: Self-pay | Admitting: *Deleted

## 2019-10-25 NOTE — Telephone Encounter (Signed)
LINQ alert received for NSVT episode on 10/24/19 at 23:47, 18 beats duration, max V 150bpm.  History of asymptomatic NSVT.  Pt previously declined to discontinue remote monitoring in 04/2019.  Routed to Dr. Caryl Comes as Juluis Rainier.  Will plan to continue monitoring for now.  LINQ implanted 06/2016, so likely nearing RRT within the next few months.

## 2019-10-25 NOTE — Telephone Encounter (Signed)
Noted Thanks SK   

## 2019-11-07 LAB — CUP PACEART REMOTE DEVICE CHECK
Date Time Interrogation Session: 20210918232236
Implantable Pulse Generator Implant Date: 20180514

## 2019-11-08 ENCOUNTER — Ambulatory Visit (INDEPENDENT_AMBULATORY_CARE_PROVIDER_SITE_OTHER): Payer: Medicare Other | Admitting: Emergency Medicine

## 2019-11-08 DIAGNOSIS — I639 Cerebral infarction, unspecified: Secondary | ICD-10-CM | POA: Diagnosis not present

## 2019-11-11 NOTE — Progress Notes (Signed)
Carelink Summary Report / Loop Recorder 

## 2019-11-12 ENCOUNTER — Telehealth: Payer: Self-pay

## 2019-11-12 NOTE — Telephone Encounter (Signed)
[facility] alert received for 15 tachy episodes and 1 AF episode. All EGM's not available. Called to request manual transmission.   Called patient, spoke to daughter Rodena Piety, [place]), states she will send a transmission for the patient after she gets off work today. States patient has not has any complaints to her and has taken medications with on missed doses.  Karrie Doffing that if patient experiences any new chest pain, shortness of breath, palpitations to go to ED for evaluation. Verbalized understanding.

## 2019-11-17 DIAGNOSIS — H35373 Puckering of macula, bilateral: Secondary | ICD-10-CM | POA: Diagnosis not present

## 2019-11-17 DIAGNOSIS — H524 Presbyopia: Secondary | ICD-10-CM | POA: Diagnosis not present

## 2019-11-17 DIAGNOSIS — E119 Type 2 diabetes mellitus without complications: Secondary | ICD-10-CM | POA: Diagnosis not present

## 2019-11-17 DIAGNOSIS — H353131 Nonexudative age-related macular degeneration, bilateral, early dry stage: Secondary | ICD-10-CM | POA: Diagnosis not present

## 2019-11-17 DIAGNOSIS — Z961 Presence of intraocular lens: Secondary | ICD-10-CM | POA: Diagnosis not present

## 2019-11-17 LAB — HM DIABETES EYE EXAM

## 2019-11-17 NOTE — Telephone Encounter (Signed)
Patient has known hx of PAF, currently taking Eliquis.

## 2019-11-18 DIAGNOSIS — E042 Nontoxic multinodular goiter: Secondary | ICD-10-CM | POA: Diagnosis not present

## 2019-11-18 DIAGNOSIS — I2510 Atherosclerotic heart disease of native coronary artery without angina pectoris: Secondary | ICD-10-CM | POA: Diagnosis not present

## 2019-11-18 DIAGNOSIS — M8580 Other specified disorders of bone density and structure, unspecified site: Secondary | ICD-10-CM | POA: Diagnosis not present

## 2019-11-18 DIAGNOSIS — E1151 Type 2 diabetes mellitus with diabetic peripheral angiopathy without gangrene: Secondary | ICD-10-CM | POA: Diagnosis not present

## 2019-11-23 DIAGNOSIS — C44612 Basal cell carcinoma of skin of right upper limb, including shoulder: Secondary | ICD-10-CM | POA: Diagnosis not present

## 2019-11-23 DIAGNOSIS — Z85828 Personal history of other malignant neoplasm of skin: Secondary | ICD-10-CM | POA: Diagnosis not present

## 2019-11-23 DIAGNOSIS — D485 Neoplasm of uncertain behavior of skin: Secondary | ICD-10-CM | POA: Diagnosis not present

## 2019-11-23 DIAGNOSIS — M7981 Nontraumatic hematoma of soft tissue: Secondary | ICD-10-CM | POA: Diagnosis not present

## 2019-11-23 DIAGNOSIS — L821 Other seborrheic keratosis: Secondary | ICD-10-CM | POA: Diagnosis not present

## 2019-11-23 DIAGNOSIS — D224 Melanocytic nevi of scalp and neck: Secondary | ICD-10-CM | POA: Diagnosis not present

## 2019-11-24 ENCOUNTER — Encounter: Payer: Self-pay | Admitting: Legal Medicine

## 2019-11-24 ENCOUNTER — Ambulatory Visit (INDEPENDENT_AMBULATORY_CARE_PROVIDER_SITE_OTHER): Payer: Medicare Other

## 2019-11-24 DIAGNOSIS — Z23 Encounter for immunization: Secondary | ICD-10-CM

## 2019-12-02 ENCOUNTER — Ambulatory Visit (INDEPENDENT_AMBULATORY_CARE_PROVIDER_SITE_OTHER): Payer: Medicare Other | Admitting: Legal Medicine

## 2019-12-02 ENCOUNTER — Encounter: Payer: Self-pay | Admitting: Legal Medicine

## 2019-12-02 ENCOUNTER — Other Ambulatory Visit: Payer: Self-pay

## 2019-12-02 VITALS — BP 110/50 | HR 52 | Temp 97.6°F | Resp 16 | Ht 63.0 in | Wt 152.0 lb

## 2019-12-02 DIAGNOSIS — R5383 Other fatigue: Secondary | ICD-10-CM | POA: Diagnosis not present

## 2019-12-02 DIAGNOSIS — I10 Essential (primary) hypertension: Secondary | ICD-10-CM

## 2019-12-02 DIAGNOSIS — I480 Paroxysmal atrial fibrillation: Secondary | ICD-10-CM

## 2019-12-02 DIAGNOSIS — Z9189 Other specified personal risk factors, not elsewhere classified: Secondary | ICD-10-CM | POA: Diagnosis not present

## 2019-12-02 LAB — POCT URINALYSIS DIP (CLINITEK)

## 2019-12-02 LAB — POC COVID19 BINAXNOW

## 2019-12-02 NOTE — Progress Notes (Signed)
Subjective:  Patient ID: Stacy [name], female    DOB: Nov 30, 1926  Age: 86 y.o. MRN: [id number]  Chief Complaint  Patient presents with  . Headache    had flu shot Wednesday 13th, head hurt over right eye, no fever, no nausea, fatigue, no appetite    HPI: patient complaining about right frontal headaches.  No fever or nausea.  Not eating well.    She complalns about chronic LBP.  She is doing well now and ony gets patient periodically.  She is fine today. She is going to beach and daughter is worried after infection or Covid.   Current Outpatient Medications on File Prior to Visit  Medication Sig Dispense Refill  . ACCU-CHEK SMARTVIEW test strip 1 each by Other route as needed for other (blood sugar test strips).   11  . cetirizine (ZYRTEC) 10 MG tablet Take 10 mg by mouth daily.    . Cholecalciferol (VITAMIN D-3) 25 MCG (1000 UT) CAPS Take 1,000 Units by mouth 2 (two) times daily.    Marland Kitchen conjugated estrogens (PREMARIN) vaginal cream Place 1 g vaginally once a week. .625 mg once weekly    . ELIQUIS 5 MG TABS tablet TAKE 1 TABLET BY MOUTH TWICE A DAY 180 tablet 1  . hydrochlorothiazide (MICROZIDE) 12.5 MG capsule Take 1 capsule (12.5 mg total) by mouth daily. 90 capsule 3  . ipratropium (ATROVENT) 0.06 % nasal spray ipratropium bromide 42 mcg (0.06 %) nasal spray  INSTILL 2 SPRAYS EACH NOSTRIL TWICE A DAY    . Melatonin 10 MG TABS Take 10 mg by mouth as needed.    . metFORMIN (GLUCOPHAGE) 500 MG tablet Take 500 mg by mouth daily with breakfast. Taking BID    . metoprolol succinate (TOPROL-XL) 25 MG 24 hr tablet TAKE 1 TABLET BY MOUTH EVERY DAY 90 tablet 3  . mometasone (ELOCON) 0.1 % cream mometasone 0.1 % topical cream  APPLY TO AFFECTED AREA EVERY DAY AS NEEDED ITCHING    . NEOMYCIN-POLYMYXIN-HYDROCORTISONE (CORTISPORIN) 1 % SOLN otic solution Place 2 drops into both ears as directed.   3  . nitroGLYCERIN (NITROSTAT) 0.4 MG SL tablet Place 1 tablet (0.4 mg total) under the tongue every 5  (five) minutes as needed for chest pain. 25 tablet 3  . omeprazole (PRILOSEC OTC) 20 MG tablet Take 20 mg by mouth daily as needed (reflux).    . rosuvastatin (CRESTOR) 10 MG tablet TAKE 1 TABLET BY MOUTH EVERY DAY 90 tablet 3  . sertraline (ZOLOFT) 50 MG tablet Take 50 mg by mouth daily.    . vitamin B-12 (CYANOCOBALAMIN) 1000 MCG tablet Take 1,000 mcg by mouth daily.    Marland Kitchen docusate sodium (COLACE) 100 MG capsule Take 100 mg by mouth daily as needed.  (Patient not taking: Reported on 12/02/2019)     No current facility-administered medications on file prior to visit.   Past Medical History:  Diagnosis Date  . Anxiety   . Hyperlipidemia    Past Surgical History:  Procedure Laterality Date  . 3 epidural steroid injections    . APPENDECTOMY    . Basal Cell Cancer Removal     forehead  . CATARACT EXTRACTION    . CATARACT EXTRACTION  12/29/2003  . CORONARY ANGIOPLASTY  03-15-1995  . CORONARY ANGIOPLASTY WITH STENT PLACEMENT  05-19-1998  . CORONARY STENT PLACEMENT  2000  . DILATION AND CURETTAGE OF UTERUS    . ENDARTERECTOMY Right 04/13/2015   Procedure: ENDARTERECTOMY CAROTID;  Surgeon: Butch Penny  Trula Slade, MD;  Location: [place] OR;  Service: Vascular;  Laterality: Right;  . ENDOMETRIAL BIOPSY    . EYE SURGERY    . heart catherization    . HEMORRHOID SURGERY    . INCISIONAL HERNIA REPAIR    . INGUINAL HERNIA REPAIR    . LOOP RECORDER INSERTION N/A 06/24/2016   Procedure: Loop Recorder Insertion;  Surgeon: Deboraha Sprang, MD;  Location: [place] [facility];  Service: Cardiovascular;  Laterality: N/A;  . PATCH ANGIOPLASTY Right 04/13/2015   Procedure: PATCH ANGIOPLASTY;  Surgeon: Serafina Mitchell, MD;  Location: [facility] OR;  Service: Vascular;  Laterality: Right;  . PTCA    . SQUAMOUS CELL CARCINOMA EXCISION     left lower leg  . TEE WITHOUT CARDIOVERSION N/A 06/24/2016   Procedure: TRANSESOPHAGEAL ECHOCARDIOGRAM (TEE);  Surgeon: Dorothy Spark, MD;  Location: [facility] ENDOSCOPY;  Service:  Cardiovascular;  Laterality: N/A;  . TONSILLECTOMY      Family History  Problem Relation Age of Onset  . Diabetes Father   . Prostate cancer Father        meta to colon  . Cancer Father        Prostate  . Parkinsonism Mother   . Stroke Paternal Uncle   . Hypertension Paternal Uncle   . Heart attack Neg Hx    Social History   Socioeconomic History  . Marital status: Widowed    Spouse name: Not on file  . Number of children: 1  . Years of education: Not on file  . Highest education level: Not on file  Occupational History  . 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 Smoking status: Former Smoker    Packs/day: 1.00    Years: 54.00    Pack years: 54.00    Types: Cigarettes    Quit date: 10/05/1998    Years since quitting: 21.1  . Smokeless tobacco: Never Used  Vaping Use  . Vaping Use: Never used  Substance and Sexual Activity  . Alcohol use: Not Currently    Alcohol/week: 0.0 standard drinks  . Drug use: Never  . Sexual activity: Not Currently  Other Topics Concern  . Not on file  Social History Narrative  . Not on file   Social Determinants of Health   Financial Resource Strain:   . Difficulty of Paying Living Expenses: Not on file  Food Insecurity:   . Worried About Charity fundraiser in the Last Year: Not on file  . Ran Out of Food in the Last Year: Not on file  Transportation Needs:   . Lack of Transportation (Medical): Not on file  . Lack of Transportation (Non-Medical): Not on file  Physical Activity:   . Days of Exercise per Week: Not on file  . Minutes of Exercise per Session: Not on file  Stress:   . Feeling of Stress : Not on file  Social Connections:   . Frequency of Communication with Friends and Family: Not on file  . Frequency of Social Gatherings with Friends and Family: Not on file  . Attends Religious Services: Not on file  . Active Member of Clubs or Organizations: Not on file  . Attends English as a second language teacher Meetings: Not on file  . Marital Status: Not on file    Review of Systems  Constitutional: Positive for fatigue.  HENT: Negative.   Eyes: Negative.   Respiratory: Negative.  Negative for cough.  Cardiovascular: Negative for chest pain, palpitations and leg swelling.  Endocrine: Negative.   Genitourinary: Negative.   Musculoskeletal: Negative.   Skin: Negative.   Neurological: Negative.      Objective:  BP (!) 110/50   Pulse (!) 52   Temp 97.6 F (36.4 C)   Resp 16   Ht 5\' 3"  (1.6 m)   Wt 152 lb (68.9 kg)   SpO2 96%   BMI 26.93 kg/m   BP/Weight 12/02/2019 09/02/2019 7/51/0258  Systolic BP 527 782 423  Diastolic BP 50 64 68  Wt. (Lbs) 152 152.6 154.2  BMI 26.93 27.03 27.32    Physical Exam Vitals reviewed.  Constitutional:      Appearance: She is well-developed.  HENT:     Head: Normocephalic.     Mouth/Throat:     Mouth: Mucous membranes are moist.  Eyes:     Extraocular Movements: Extraocular movements intact.  Cardiovascular:     Rate and Rhythm: Normal rate. Rhythm irregular.     Heart sounds: Normal heart sounds.  Pulmonary:     Effort: Pulmonary effort is normal.     Breath sounds: Normal breath sounds.  Musculoskeletal:        General: Normal range of motion.  Neurological:     Mental Status: She is alert.    EKG: nsr, rate 61, pr 170msec, qrs 151msec, QTC 427 msec, LAFB   Lab Results  Component Value Date   WBC 7.5 08/31/2019   HGB 14.5 08/31/2019   HCT 44.2 08/31/2019   PLT 178 08/31/2019   GLUCOSE 159 (H) 08/31/2019   CHOL 125 08/31/2019   TRIG 146 08/31/2019   HDL 44 08/31/2019   LDLCALC 56 08/31/2019   ALT 26 08/31/2019   AST 40 08/31/2019   NA 139 08/31/2019   K 3.8 08/31/2019   CL 100 08/31/2019   CREATININE 1.05 (H) 08/31/2019   BUN 11 08/31/2019   CO2 24 08/31/2019   TSH 1.730 09/02/2019   INR 1.06 04/05/2015   HGBA1C 8.4 (H) 08/31/2019   MICROALBUR 10 04/21/2019      Assessment & Plan:   1.  Essential hypertension - CBC with Differential/Platelet - Comprehensive metabolic panel An individual hypertension care plan was established and reinforced today.  The patient's status was assessed using clinical findings on exam and labs or diagnostic tests. The patient's success at meeting treatment goals on disease specific evidence-based guidelines and found to be well controlled. SELF MANAGEMENT: The patient and I together assessed ways to personally work towards obtaining the recommended goals. RECOMMENDATIONS: avoid decongestants found in common cold remedies, decrease consumption of alcohol, perform routine monitoring of BP with home BP cuff, exercise, reduction of dietary salt, take medicines as prescribed, try not to miss doses and quit smoking.  Regular exercise and maintaining a healthy weight is needed.  Stress reduction may help. A CLINICAL SUMMARY including written plan identify barriers to care unique to individual due to social or financial issues.  We attempt to mutually creat solutions for individual and family understanding.  2. Fatigue, unspecified type - POCT URINALYSIS DIP (CLINITEK) Check CBC and CMP, urine is normal  3. Paroxysmal atrial fibrillation [facility]) Patient has a diagnosis of paroxysmal atrial fibrillation.   Patient is on eliquis and has controlled ventricular response.  Patient is CV stable EKG is NSR.  4. At increased risk of exposure to COVID-19 virus Patient has negative rapid covid test    No orders of the defined types were placed in  this encounter.   Orders Placed This Encounter  Procedures  . CBC with Differential/Platelet  . Comprehensive metabolic panel  . POCT URINALYSIS DIP (CLINITEK)     Follow-up: No follow-ups on file.  An After Visit Summary was printed and given to the patient.  [place] [phone]

## 2019-12-03 ENCOUNTER — Telehealth: Payer: Self-pay

## 2019-12-03 NOTE — Telephone Encounter (Addendum)
  Spoke to patients daughter Rodena Piety), states patient reports of feeling fatigued lately over the past two weeks. States she got her flu shot and felt worse. States patient saw PCP yesterday, had EKG, urine, covid test, and a general workup and everything checked out. Compliant with medications per Rodena Piety. States patient has improved over the past few days.  Routing to Dr. Curt Bears (covering for Dr. Caryl Comes) for recommendations.

## 2019-12-03 NOTE — Telephone Encounter (Signed)
If BP allows, can increase metoprolol to 50 mg

## 2019-12-04 LAB — COMPREHENSIVE METABOLIC PANEL
ALT: 23 IU/L (ref 0–32)
AST: 30 IU/L (ref 0–40)
Albumin/Globulin Ratio: 1.8 (ref 1.2–2.2)
Albumin: 4.3 g/dL (ref 3.5–4.6)
Alkaline Phosphatase: 80 IU/L (ref 44–121)
BUN/Creatinine Ratio: 13 (ref 12–28)
BUN: 12 mg/dL (ref 10–36)
Bilirubin Total: 0.4 mg/dL (ref 0.0–1.2)
CO2: 22 mmol/L (ref 20–29)
Calcium: 9.4 mg/dL (ref 8.7–10.3)
Chloride: 99 mmol/L (ref 96–106)
Creatinine, Ser: 0.95 mg/dL (ref 0.57–1.00)
GFR calc Af Amer: 61 mL/min/{1.73_m2} (ref >59)
GFR calc non Af Amer: 53 mL/min/{1.73_m2} — ABNORMAL LOW (ref >59)
Globulin, Total: 2.4 g/dL (ref 1.5–4.5)
Glucose: 133 mg/dL — ABNORMAL HIGH (ref 65–99)
Potassium: 3.5 mmol/L (ref 3.5–5.2)
Sodium: 141 mmol/L (ref 134–144)
Total Protein: 6.7 g/dL (ref 6.0–8.5)

## 2019-12-04 LAB — CBC WITH DIFFERENTIAL/PLATELET
Basophils Absolute: 0.1 10*3/uL (ref 0.0–0.2)
Basos: 1 %
EOS (ABSOLUTE): 0.2 10*3/uL (ref 0.0–0.4)
Eos: 2 %
Hematocrit: 42.8 % (ref 34.0–46.6)
Hemoglobin: 14.1 g/dL (ref 11.1–15.9)
Immature Grans (Abs): 0 10*3/uL (ref 0.0–0.1)
Immature Granulocytes: 0 %
Lymphocytes Absolute: 3.8 10*3/uL — ABNORMAL HIGH (ref 0.7–3.1)
Lymphs: 37 %
MCH: 29.2 pg (ref 26.6–33.0)
MCHC: 32.9 g/dL (ref 31.5–35.7)
MCV: 89 fL (ref 79–97)
Monocytes Absolute: 0.9 10*3/uL (ref 0.1–0.9)
Monocytes: 9 %
Neutrophils Absolute: 5.1 10*3/uL (ref 1.4–7.0)
Neutrophils: 51 %
Platelets: 216 10*3/uL (ref 150–450)
RBC: 4.83 x10E6/uL (ref 3.77–5.28)
RDW: 13.5 % (ref 11.7–15.4)
WBC: 10.1 10*3/uL (ref 3.4–10.8)

## 2019-12-04 NOTE — Progress Notes (Signed)
CBC normal, glucose 133, kidney tests stable, liver tests normal,  lp

## 2019-12-07 ENCOUNTER — Encounter: Payer: Self-pay | Admitting: Legal Medicine

## 2019-12-07 NOTE — Telephone Encounter (Signed)
Last OV with PCP 12/02/19   BP- 110/50; HR: 50  Will route to Dr. Curt Bears to update. Will not increase metoprolol at this time.

## 2019-12-16 ENCOUNTER — Ambulatory Visit (INDEPENDENT_AMBULATORY_CARE_PROVIDER_SITE_OTHER): Payer: Medicare Other

## 2019-12-16 DIAGNOSIS — Z23 Encounter for immunization: Secondary | ICD-10-CM | POA: Diagnosis not present

## 2019-12-16 NOTE — Progress Notes (Signed)
   Covid-19 Vaccination Clinic  Name:  Stacy [name]    MRN: [id number] DOB: 10-31-1926  12/16/2019  Stacy [name] was observed post Covid-19 immunization for 15 minutes without incident. She was provided with Vaccine Information Sheet and instruction to access the V-Safe system.   Stacy [name] was instructed to call 911 with any severe reactions post vaccine: Marland Kitchen Difficulty breathing  . Swelling of face and throat  . A fast heartbeat  . A bad rash all over body  . Dizziness and weakness

## 2019-12-21 ENCOUNTER — Other Ambulatory Visit: Payer: Self-pay | Admitting: Legal Medicine

## 2020-01-03 ENCOUNTER — Ambulatory Visit (INDEPENDENT_AMBULATORY_CARE_PROVIDER_SITE_OTHER): Payer: Medicare Other

## 2020-01-03 DIAGNOSIS — I639 Cerebral infarction, unspecified: Secondary | ICD-10-CM | POA: Diagnosis not present

## 2020-01-03 LAB — CUP PACEART REMOTE DEVICE CHECK
Date Time Interrogation Session: 20211119223623
Implantable Pulse Generator Implant Date: 20180514

## 2020-01-04 ENCOUNTER — Encounter: Payer: Self-pay | Admitting: Student

## 2020-01-04 ENCOUNTER — Other Ambulatory Visit: Payer: Self-pay

## 2020-01-04 ENCOUNTER — Ambulatory Visit (INDEPENDENT_AMBULATORY_CARE_PROVIDER_SITE_OTHER): Payer: Medicare Other | Admitting: Student

## 2020-01-04 VITALS — BP 126/66 | HR 74 | Ht 63.5 in | Wt 152.0 lb

## 2020-01-04 DIAGNOSIS — I639 Cerebral infarction, unspecified: Secondary | ICD-10-CM | POA: Diagnosis not present

## 2020-01-04 DIAGNOSIS — I480 Paroxysmal atrial fibrillation: Secondary | ICD-10-CM

## 2020-01-04 DIAGNOSIS — I10 Essential (primary) hypertension: Secondary | ICD-10-CM

## 2020-01-04 LAB — CUP PACEART INCLINIC DEVICE CHECK
Date Time Interrogation Session: 20211123115340
Implantable Pulse Generator Implant Date: 20180514

## 2020-01-04 NOTE — Progress Notes (Signed)
Electrophysiology Office Note Date: 01/04/2020  ID:  Stacy [name], Stacy [name] 1926/10/14, MRN [id number]  PCP: Lillard Anes, MD Primary Cardiologist: Sherren Mocha, MD Electrophysiologist: Virl Axe, MD   CC: ILR follow-up  Stacy [name] is a 86 y.o. female seen today for Dr. Caryl Comes . she presents today for routine electrophysiology followup.  Since last being seen in our clinic, the patient reports doing well. She had a recent non specific viral illness that seemed to exacerbate her AF, but otherwise she is doing well she denies chest pain, palpitations, dyspnea, PND, orthopnea, nausea, vomiting, dizziness, syncope, edema, weight gain, or early satiety.  Device History: Medtronic loop recorder implanted 2018 for Cryptogenic Stroke  Past Medical History:  Diagnosis Date  . Anxiety   . Hyperlipidemia    Past Surgical History:  Procedure Laterality Date  . 3 epidural steroid injections    . APPENDECTOMY    . Basal Cell Cancer Removal     forehead  . CATARACT EXTRACTION    . CATARACT EXTRACTION  12/29/2003  . CORONARY ANGIOPLASTY  03-15-1995  . CORONARY ANGIOPLASTY WITH STENT PLACEMENT  05-19-1998  . CORONARY STENT PLACEMENT  2000  . DILATION AND CURETTAGE OF UTERUS    . ENDARTERECTOMY Right 04/13/2015   Procedure: ENDARTERECTOMY CAROTID;  Surgeon: Serafina Mitchell, MD;  Location: [facility] OR;  Service: Vascular;  Laterality: Right;  . ENDOMETRIAL BIOPSY    . EYE SURGERY    . heart catherization    . HEMORRHOID SURGERY    . INCISIONAL HERNIA REPAIR    . INGUINAL HERNIA REPAIR    . LOOP RECORDER INSERTION N/A 06/24/2016   Procedure: Loop Recorder Insertion;  Surgeon: Deboraha Sprang, MD;  Location: [place] [facility];  Service: Cardiovascular;  Laterality: N/A;  . PATCH ANGIOPLASTY Right 04/13/2015   Procedure: PATCH ANGIOPLASTY;  Surgeon: Serafina Mitchell, MD;  Location: [facility] OR;  Service: Vascular;  Laterality: Right;  . PTCA    . SQUAMOUS CELL CARCINOMA EXCISION     left lower  leg  . TEE WITHOUT CARDIOVERSION N/A 06/24/2016   Procedure: TRANSESOPHAGEAL ECHOCARDIOGRAM (TEE);  Surgeon: Dorothy Spark, MD;  Location: [facility] ENDOSCOPY;  Service: Cardiovascular;  Laterality: N/A;  . TONSILLECTOMY      Current Outpatient Medications  Medication Sig Dispense Refill  . ACCU-CHEK SMARTVIEW test strip 1 each by Other route as needed for other (blood sugar test strips).   11  . cetirizine (ZYRTEC) 10 MG tablet Take 10 mg by mouth daily.    . Cholecalciferol (VITAMIN D-3) 25 MCG (1000 UT) CAPS Take 1,000 Units by mouth 2 (two) times daily.    Marland Kitchen conjugated estrogens (PREMARIN) vaginal cream Place 1 g vaginally once a week. .625 mg once weekly    . ELIQUIS 5 MG TABS tablet TAKE 1 TABLET BY MOUTH TWICE A DAY 180 tablet 1  . hydrochlorothiazide (MICROZIDE) 12.5 MG capsule Take 1 capsule (12.5 mg total) by mouth daily. 90 capsule 3  . ipratropium (ATROVENT) 0.06 % nasal spray ipratropium bromide 42 mcg (0.06 %) nasal spray  INSTILL 2 SPRAYS EACH NOSTRIL TWICE A DAY    . Melatonin 10 MG TABS Take 10 mg by mouth as needed.    . metFORMIN (GLUCOPHAGE) 500 MG tablet Take 500 mg by mouth daily with breakfast. Taking BID    . metoprolol succinate (TOPROL-XL) 25 MG 24 hr tablet TAKE 1 TABLET BY MOUTH EVERY DAY 90 tablet 3  . mometasone (ELOCON) 0.1 %  cream mometasone 0.1 % topical cream  APPLY TO AFFECTED AREA EVERY DAY AS NEEDED ITCHING    . NEOMYCIN-POLYMYXIN-HYDROCORTISONE (CORTISPORIN) 1 % SOLN otic solution Place 2 drops into both ears as directed.   3  . nitroGLYCERIN (NITROSTAT) 0.4 MG SL tablet Place 1 tablet (0.4 mg total) under the tongue every 5 (five) minutes as needed for chest pain. 25 tablet 3  . omeprazole (PRILOSEC OTC) 20 MG tablet Take 20 mg by mouth daily as needed (reflux).    . rosuvastatin (CRESTOR) 10 MG tablet TAKE 1 TABLET BY MOUTH EVERY DAY 90 tablet 3  . sertraline (ZOLOFT) 50 MG tablet Take 50 mg by mouth daily.    . vitamin B-12 (CYANOCOBALAMIN) 1000 MCG  tablet Take 1,000 mcg by mouth daily.     No current facility-administered medications for this visit.    Allergies:   Iohexol, Sulfonamide derivatives, Latex, Tranilast, Ivp dye [iodinated diagnostic agents], and Tape   Social History: Social History   Socioeconomic History  . Marital status: Widowed    Spouse name: Not on file  . Number of children: 1  . Years of education: Not on file  . Highest education level: Not on file  Occupational History  . 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 Smoking status: Former Smoker    Packs/day: 1.00    Years: 54.00    Pack years: 54.00    Types: Cigarettes    Quit date: 10/05/1998    Years since quitting: 21.2  . Smokeless tobacco: Never Used  Vaping Use  . Vaping Use: Never used  Substance and Sexual Activity  . Alcohol use: Not Currently    Alcohol/week: 0.0 standard drinks  . Drug use: Never  . Sexual activity: Not Currently  Other Topics Concern  . Not on file  Social History Narrative  . Not on file   Social Determinants of Health   Financial Resource Strain:   . Difficulty of Paying Living Expenses: Not on file  Food Insecurity:   . Worried About Charity fundraiser in the Last Year: Not on file  . Ran Out of Food in the Last Year: Not on file  Transportation Needs:   . Lack of Transportation (Medical): Not on file  . Lack of Transportation (Non-Medical): Not on file  Physical Activity:   . Days of Exercise per Week: Not on file  . Minutes of Exercise per Session: Not on file  Stress:   . Feeling of Stress : Not on file  Social Connections:   . Frequency of Communication with Friends and Family: Not on file  . Frequency of Social Gatherings with Friends and Family: Not on file  . Attends Religious Services: Not on file  . Active Member of Clubs or Organizations: Not on file  . Attends Archivist Meetings: Not on file  . Marital Status: Not on file  Intimate  Partner Violence:   . Fear of Current or Ex-Partner: Not on file  . Emotionally Abused: Not on file  . Physically Abused: Not on file  . Sexually Abused: Not on file    Family History: Family History  Problem Relation Age of Onset  . Diabetes Father   . Prostate cancer Father        meta to colon  . Cancer Father        Prostate  . Parkinsonism Mother   . Stroke Paternal Uncle   .  Hypertension Paternal Uncle   . Heart attack Neg Hx      Review of Systems: All other systems reviewed and are otherwise negative except as noted above.  Physical Exam: Vitals:   01/04/20 1105  BP: 126/66  Pulse: 74  SpO2: 95%  Weight: 152 lb (68.9 kg)  Height: 5' 3.5" (1.613 m)     GEN- The patient is well appearing, alert and oriented x 3 today.   HEENT: normocephalic, atraumatic; sclera clear, conjunctiva pink; hearing intact; oropharynx clear; neck supple  Lungs- Clear to ausculation bilaterally, normal work of breathing.  No wheezes, rales, rhonchi Heart- Regular rate and rhythm, no murmurs, rubs or gallops  GI- soft, non-tender, non-distended, bowel sounds present  Extremities- no clubbing, cyanosis, or edema  MS- no significant deformity or atrophy Skin- warm and dry, no rash or lesion; PPM pocket well healed Psych- euthymic mood, full affect Neuro- strength and sensation are intact  ILR Interrogation- reviewed in detail today,  See PACEART report  EKG:  EKG is not ordered today.  Recent Labs: 09/02/2019: TSH 1.730 12/02/2019: ALT 23; BUN 12; Creatinine, Ser 0.95; Hemoglobin 14.1; Platelets 216; Potassium 3.5; Sodium 141   Wt Readings from Last 3 Encounters:  01/04/20 152 lb (68.9 kg)  12/02/19 152 lb (68.9 kg)  09/02/19 152 lb 9.6 oz (69.2 kg)     Other studies Reviewed: Additional studies/ records that were reviewed today include: Previous EP office notes and remotes.   Assessment and Plan:  1. Cryptogenic Stroke s/p Medtronic Loop recorder -> AF identified Continue  on Eliquis. Denies bleeding Overall AF burden ~2% Normal device function See Pace Art report No changes today  2. NSVT Previously noted. Asymptomatic.   Current medicines are reviewed at length with the patient today.   The patient does not have concerns regarding her medicines.  The following changes were made today:  none  Labs/ tests ordered today include:  Orders Placed This Encounter  Procedures  . CUP PACEART INCLINIC DEVICE CHECK    Disposition:   Follow up with Dr. Caryl Comes in 1 year.  (She also follows with Dr. Burt Knack regularly.    Jacalyn Lefevre, PA-C  01/04/2020 11:57 AM  [facility] [address] [place] [address] [phone] (office) [phone] (fax)

## 2020-01-04 NOTE — Patient Instructions (Signed)
Medication Instructions:  *If you need a refill on your cardiac medications before your next appointment, please call your pharmacy*  Follow-Up: At [facility], you and your health needs are our priority.  As part of our continuing mission to provide you with exceptional heart care, we have created designated Provider Care Teams.  These Care Teams include your primary Cardiologist (physician) and Advanced Practice Providers (APPs -  Physician Assistants and Nurse Practitioners) who all work together to provide you with the care you need, when you need it.  We recommend signing up for the patient portal called "MyChart".  Sign up information is provided on this After Visit Summary.  MyChart is used to connect with patients for Virtual Visits (Telemedicine).  Patients are able to view lab/test results, encounter notes, upcoming appointments, etc.  Non-urgent messages can be sent to your provider as well.   To learn more about what you can do with MyChart, go to [email or web address].    Your next appointment:   Your physician wants you to follow-up in: 1 YEAR with Dr. Caryl Comes. You will receive a reminder letter in the mail two months in advance. If you don't receive a letter, please call our office to schedule the follow-up appointment.  Remote monitoring is used to monitor your Loop Recorder [facility]) from home. This monitoring reduces the number of office visits required to check your device to one time per year. It allows [place] to keep an eye on the functioning of your device to ensure it is working properly. You are scheduled for a device check from home on 01/26/20. You may send your transmission at any time that day. If you have a wireless device, the transmission will be sent automatically. After your physician reviews your transmission, you will receive a postcard with your next transmission date.  The format for your next appointment:   In Person with Virl Axe, MD

## 2020-01-10 NOTE — Progress Notes (Signed)
Carelink Summary Report / Loop Recorder 

## 2020-01-11 ENCOUNTER — Other Ambulatory Visit: Payer: Medicare Other

## 2020-01-13 ENCOUNTER — Ambulatory Visit: Payer: Medicare Other | Admitting: Legal Medicine

## 2020-01-19 ENCOUNTER — Other Ambulatory Visit: Payer: Self-pay | Admitting: Legal Medicine

## 2020-01-31 ENCOUNTER — Ambulatory Visit (INDEPENDENT_AMBULATORY_CARE_PROVIDER_SITE_OTHER): Payer: Medicare Other | Admitting: Cardiovascular Disease

## 2020-01-31 ENCOUNTER — Other Ambulatory Visit: Payer: Self-pay

## 2020-01-31 ENCOUNTER — Encounter: Payer: Self-pay | Admitting: Cardiovascular Disease

## 2020-01-31 VITALS — BP 126/72 | HR 56 | Ht 63.5 in | Wt 151.6 lb

## 2020-01-31 DIAGNOSIS — E782 Mixed hyperlipidemia: Secondary | ICD-10-CM

## 2020-01-31 DIAGNOSIS — I480 Paroxysmal atrial fibrillation: Secondary | ICD-10-CM

## 2020-01-31 DIAGNOSIS — I639 Cerebral infarction, unspecified: Secondary | ICD-10-CM | POA: Diagnosis not present

## 2020-01-31 DIAGNOSIS — I2510 Atherosclerotic heart disease of native coronary artery without angina pectoris: Secondary | ICD-10-CM | POA: Diagnosis not present

## 2020-01-31 DIAGNOSIS — I10 Essential (primary) hypertension: Secondary | ICD-10-CM | POA: Diagnosis not present

## 2020-01-31 NOTE — Progress Notes (Signed)
Cardiology Office Note:    Date:  01/31/2020   ID:  Stacy [name], DOB 1926-05-31, MRN [id number]  PCP:  Lillard Anes, MD  [facility] Cardiologist:  Sherren Mocha, MD  [facility] Electrophysiologist:  Virl Axe, MD   Referring MD: Lillard Anes,*   Chief Complaint  Patient presents with  . Coronary Artery Disease    History of Present Illness:    Stacy [name] is a 86 y.o. female with a hx of coronary artery disease and stroke, presenting for follow-up evaluation. The patient initially presented in 1997 with an anterior wall MI treated with balloon angioplasty of the LAD. She later underwent stenting of the RCA in 2000. Her most recent heart catheterization in 2008 demonstrated patency of her angioplasty and stent sites in the LAD and RCA, respectively. The patient had a stroke in 2018. A loop recorder demonstrated paroxysmal atrial fibrillation from which the patient has not had any associated symptoms.   The patient is here with her daughter today.  She is really been doing very well.  She had an episode of lethargy around the time she had her influenza vaccine.  She was noted to be in atrial fibrillation at that time.  This is all resolved now and she has had no further complaints.  She denies heart palpitations, chest pain, chest pressure, or shortness of breath.  She has had no lightheadedness or syncope.  Past Medical History:  Diagnosis Date  . Anxiety   . Hyperlipidemia     Past Surgical History:  Procedure Laterality Date  . 3 epidural steroid injections    . APPENDECTOMY    . Basal Cell Cancer Removal     forehead  . CATARACT EXTRACTION    . CATARACT EXTRACTION  12/29/2003  . CORONARY ANGIOPLASTY  03-15-1995  . CORONARY ANGIOPLASTY WITH STENT PLACEMENT  05-19-1998  . CORONARY STENT PLACEMENT  2000  . DILATION AND CURETTAGE OF UTERUS    . ENDARTERECTOMY Right 04/13/2015   Procedure: ENDARTERECTOMY CAROTID;  Surgeon: Serafina Mitchell,  MD;  Location: [facility] OR;  Service: Vascular;  Laterality: Right;  . ENDOMETRIAL BIOPSY    . EYE SURGERY    . heart catherization    . HEMORRHOID SURGERY    . INCISIONAL HERNIA REPAIR    . INGUINAL HERNIA REPAIR    . LOOP RECORDER INSERTION N/A 06/24/2016   Procedure: Loop Recorder Insertion;  Surgeon: Deboraha Sprang, MD;  Location: [facility];  Service: Cardiovascular;  Laterality: N/A;  . PATCH ANGIOPLASTY Right 04/13/2015   Procedure: PATCH ANGIOPLASTY;  Surgeon: Serafina Mitchell, MD;  Location[facility] OR;  Service: Vascular;  Laterality: Right;  . PTCA    . SQUAMOUS CELL CARCINOMA EXCISION     left lower leg  . TEE WITHOUT CARDIOVERSION N/A 06/24/2016   Procedure: TRANSESOPHAGEAL ECHOCARDIOGRAM (TEE);  Surgeon: Dorothy Spark, MD;  Location: [facility] ENDOSCOPY;  Service: Cardiovascular;  Laterality: N/A;  . TONSILLECTOMY      Current Medications: Current Meds  Medication Sig  . ACCU-CHEK SMARTVIEW test strip 1 each by Other route as needed for other (blood sugar test strips).   . cetirizine (ZYRTEC) 10 MG tablet TAKE 1 TABLET BY MOUTH EVERY DAY  . Cholecalciferol (VITAMIN D-3) 25 MCG (1000 UT) CAPS Take 1,000 Units by mouth 2 (two) times daily.  Marland Kitchen conjugated estrogens (PREMARIN) vaginal cream Place 1 g vaginally once a week. .625 mg once weekly  . ELIQUIS 5 MG TABS tablet  TAKE 1 TABLET BY MOUTH TWICE A DAY  . ipratropium (ATROVENT) 0.06 % nasal spray ipratropium bromide 42 mcg (0.06 %) nasal spray  INSTILL 2 SPRAYS EACH NOSTRIL TWICE A DAY  . Melatonin 10 MG TABS Take 10 mg by mouth as needed.  . metFORMIN (GLUCOPHAGE) 500 MG tablet Take 500 mg by mouth daily with breakfast. Taking BID  . metoprolol succinate (TOPROL-XL) 25 MG 24 hr tablet TAKE 1 TABLET BY MOUTH EVERY DAY  . mometasone (ELOCON) 0.1 % cream mometasone 0.1 % topical cream  APPLY TO AFFECTED AREA EVERY DAY AS NEEDED ITCHING  . NEOMYCIN-POLYMYXIN-HYDROCORTISONE (CORTISPORIN) 1 % SOLN otic solution Place 2 drops into both  ears as directed.  . nitroGLYCERIN (NITROSTAT) 0.4 MG SL tablet Place 1 tablet (0.4 mg total) under the tongue every 5 (five) minutes as needed for chest pain.  Marland Kitchen omeprazole (PRILOSEC OTC) 20 MG tablet Take 20 mg by mouth daily as needed (reflux).  . rosuvastatin (CRESTOR) 10 MG tablet TAKE 1 TABLET BY MOUTH EVERY DAY  . sertraline (ZOLOFT) 50 MG tablet Take 50 mg by mouth daily.  . vitamin B-12 (CYANOCOBALAMIN) 1000 MCG tablet Take 1,000 mcg by mouth daily.     Allergies:   Iohexol, Sulfonamide derivatives, Latex, Tranilast, Ivp dye [iodinated diagnostic agents], and Tape   Social History   Socioeconomic History  . Marital status: Widowed    Spouse name: Not on file  . Number of children: 1  . Years of education: Not on file  . Highest education level: Not on file  Occupational History  . 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 Smoking status: Former Smoker    Packs/day: 1.00    Years: 54.00    Pack years: 54.00    Types: Cigarettes    Quit date: 10/05/1998    Years since quitting: 21.3  . Smokeless tobacco: Never Used  Vaping Use  . Vaping Use: Never used  Substance and Sexual Activity  . Alcohol use: Not Currently    Alcohol/week: 0.0 standard drinks  . Drug use: Never  . Sexual activity: Not Currently  Other Topics Concern  . Not on file  Social History Narrative  . Not on file   Social Determinants of Health   Financial Resource Strain: Not on file  Food Insecurity: Not on file  Transportation Needs: Not on file  Physical Activity: Not on file  Stress: Not on file  Social Connections: Not on file     Family History: The patient's family history includes Cancer in her father; Diabetes in her father; Hypertension in her paternal uncle; Parkinsonism in her mother; Prostate cancer in her father; Stroke in her paternal uncle. There is no history of Heart attack.  ROS:   Please see the history of present illness.     All other systems reviewed and are negative.  EKGs/Labs/Other Studies Reviewed:    The following studies were reviewed today: TEE 17-Jul-2016: Study Conclusions   - Left ventricle: Systolic function was mildly reduced. The  estimated ejection fraction was in the range of 45% to 50%.  - Aortic valve: No evidence of vegetation.  - Mitral valve: There was moderate regurgitation.  - Left atrium: The atrium was dilated. No evidence of thrombus in  the atrial cavity or appendage. No evidence of thrombus in the  atrial cavity or appendage.  - Right atrium: No evidence of thrombus in the atrial cavity or  appendage.  -  Atrial septum: No defect or patent foramen ovale was identified.  - Tricuspid valve: There was moderate regurgitation.  - Pulmonary arteries: PA peak pressure: 39 mm Hg (S).   Myoview Scan 08/21/2016: Study Highlights    Nuclear stress EF: 56%.  Findings consistent with ischemia and prior myocardial infarction.  This is an intermediate risk study.  The left ventricular ejection fraction is normal (55-65%).   Large anterior apical wall infarct with likely aneurysm formation. Small area of mild ischemia in basal lateral and basal inferior wall EF estimated at 56% with apical akinesis but is likely lower given perfusion defect  EKG:  EKG is not ordered today.    Recent Labs: 09/02/2019: TSH 1.730 12/02/2019: ALT 23; BUN 12; Creatinine, Ser 0.95; Hemoglobin 14.1; Platelets 216; Potassium 3.5; Sodium 141  Recent Lipid Panel    Component Value Date/Time   CHOL 125 08/31/2019 1000   TRIG 146 08/31/2019 1000   HDL 44 08/31/2019 1000   CHOLHDL 2.8 08/31/2019 1000   CHOLHDL 2.4 06/23/2016 0441   VLDL 23 06/23/2016 0441   LDLCALC 56 08/31/2019 1000     Risk Assessment/Calculations:     CHA2DS2-VASc Score = 7  This indicates a 11.2% annual risk of stroke. The patient's score is based upon: CHF History: No HTN History: Yes Diabetes History: No Stroke  History: Yes Vascular Disease History: Yes Age Score: 2 Gender Score: 1      Physical Exam:    VS:  BP 126/72   Pulse (!) 56   Ht 5' 3.5" (1.613 m)   Wt 151 lb 9.6 oz (68.8 kg)   SpO2 98%   BMI 26.43 kg/m     Wt Readings from Last 3 Encounters:  01/31/20 151 lb 9.6 oz (68.8 kg)  01/04/20 152 lb (68.9 kg)  12/02/19 152 lb (68.9 kg)     GEN:  Well nourished, well developed elderly woman in no acute distress HEENT: Normal NECK: No JVD; No carotid bruits LYMPHATICS: No lymphadenopathy CARDIAC: RRR, no murmurs, rubs, gallops RESPIRATORY:  Clear to auscultation without rales, wheezing or rhonchi  ABDOMEN: Soft, non-tender, non-distended MUSCULOSKELETAL:  No edema; No deformity  SKIN: Warm and dry NEUROLOGIC:  Alert and oriented x 3 PSYCHIATRIC:  Normal affect   ASSESSMENT:    1. Paroxysmal atrial fibrillation (HCC)   2. Coronary artery disease involving native coronary artery of native heart without angina pectoris   3. Mixed hyperlipidemia   4. Essential hypertension    PLAN:    In order of problems listed above:  1. Low atrial fibrillation burden noted (2%).  Appears to be asymptomatic except for an episode of fatigue/lethargy around the time she had her influenza vaccine when she was noted to be in atrial fibrillation.  Tolerating oral anticoagulation with apixaban. 2. No angina on current medical therapy.  Extensive coronary disease history noted with remote PCI procedures performed.  Tolerating anticoagulation.  Also on a beta-blocker and statin drug. 3. Treated with rosuvastatin.  Last lipids demonstrated an LDL cholesterol 56 mg/dL, HDL 44, triglycerides 146, total cholesterol 125.  ALT was normal at 23. 4. Blood pressure well controlled on metoprolol succinate and hydrochlorothiazide.     Medication Adjustments/Labs and Tests Ordered: Current medicines are reviewed at length with the patient today.  Concerns regarding medicines are outlined above.  No orders  of the defined types were placed in this encounter.  No orders of the defined types were placed in this encounter.   There are no Patient Instructions  on file for this visit.   Signed, Sherren Mocha, MD  01/31/2020 3:26 PM    [place]

## 2020-01-31 NOTE — Patient Instructions (Signed)

## 2020-02-01 ENCOUNTER — Ambulatory Visit (INDEPENDENT_AMBULATORY_CARE_PROVIDER_SITE_OTHER): Payer: Medicare Other

## 2020-02-01 DIAGNOSIS — I639 Cerebral infarction, unspecified: Secondary | ICD-10-CM

## 2020-02-01 LAB — CUP PACEART REMOTE DEVICE CHECK
Date Time Interrogation Session: 20211220223416
Implantable Pulse Generator Implant Date: 20180514

## 2020-02-14 ENCOUNTER — Telehealth: Payer: Self-pay | Admitting: Emergency Medicine

## 2020-02-14 NOTE — Telephone Encounter (Signed)
Contacted patient by phone in reference to  Possible [employer] ILR explant due to device has reached RRT. Patient states that Stacy [name] doe not wish to have the device explanted at this time but if Stacy [name] changes her mind that Stacy [name] will call the [facility] [phone]. Advised patient that Stacy [name] would be receiving a return kit for the monitor in the mail and that Stacy [name] could unplug her home monitoring device.

## 2020-02-16 NOTE — Progress Notes (Signed)
Carelink Summary Report / Loop Recorder 

## 2020-02-22 ENCOUNTER — Other Ambulatory Visit: Payer: Medicare Other

## 2020-02-22 ENCOUNTER — Other Ambulatory Visit: Payer: Self-pay

## 2020-02-22 DIAGNOSIS — E669 Obesity, unspecified: Secondary | ICD-10-CM

## 2020-02-22 DIAGNOSIS — E559 Vitamin D deficiency, unspecified: Secondary | ICD-10-CM

## 2020-02-22 DIAGNOSIS — E1159 Type 2 diabetes mellitus with other circulatory complications: Secondary | ICD-10-CM

## 2020-02-22 DIAGNOSIS — E782 Mixed hyperlipidemia: Secondary | ICD-10-CM

## 2020-02-22 DIAGNOSIS — I10 Essential (primary) hypertension: Secondary | ICD-10-CM

## 2020-02-22 DIAGNOSIS — D510 Vitamin B12 deficiency anemia due to intrinsic factor deficiency: Secondary | ICD-10-CM

## 2020-02-23 LAB — COMPREHENSIVE METABOLIC PANEL WITH GFR
ALT: 23 [IU]/L (ref 0–32)
AST: 48 [IU]/L — ABNORMAL HIGH (ref 0–40)
Albumin/Globulin Ratio: 1.7 (ref 1.2–2.2)
Albumin: 4 g/dL (ref 3.5–4.6)
Alkaline Phosphatase: 89 [IU]/L (ref 44–121)
BUN/Creatinine Ratio: 9 — ABNORMAL LOW (ref 12–28)
BUN: 9 mg/dL — ABNORMAL LOW (ref 10–36)
Bilirubin Total: 0.9 mg/dL (ref 0.0–1.2)
CO2: 24 mmol/L (ref 20–29)
Calcium: 9.5 mg/dL (ref 8.7–10.3)
Chloride: 100 mmol/L (ref 96–106)
Creatinine, Ser: 1.04 mg/dL — ABNORMAL HIGH (ref 0.57–1.00)
GFR calc Af Amer: 54 mL/min/{1.73_m2} — ABNORMAL LOW
GFR calc non Af Amer: 47 mL/min/{1.73_m2} — ABNORMAL LOW
Globulin, Total: 2.4 g/dL (ref 1.5–4.5)
Glucose: 178 mg/dL — ABNORMAL HIGH (ref 65–99)
Potassium: 4.5 mmol/L (ref 3.5–5.2)
Sodium: 137 mmol/L (ref 134–144)
Total Protein: 6.4 g/dL (ref 6.0–8.5)

## 2020-02-23 LAB — CBC WITH DIFFERENTIAL/PLATELET
Basophils Absolute: 0.1 10*3/uL (ref 0.0–0.2)
Basos: 1 %
EOS (ABSOLUTE): 0.3 10*3/uL (ref 0.0–0.4)
Eos: 3 %
Hematocrit: 43.3 % (ref 34.0–46.6)
Hemoglobin: 13.8 g/dL (ref 11.1–15.9)
Immature Grans (Abs): 0 10*3/uL (ref 0.0–0.1)
Immature Granulocytes: 0 %
Lymphocytes Absolute: 3.3 10*3/uL — ABNORMAL HIGH (ref 0.7–3.1)
Lymphs: 39 %
MCH: 28.6 pg (ref 26.6–33.0)
MCHC: 31.9 g/dL (ref 31.5–35.7)
MCV: 90 fL (ref 79–97)
Monocytes Absolute: 0.7 10*3/uL (ref 0.1–0.9)
Monocytes: 9 %
Neutrophils Absolute: 4.1 10*3/uL (ref 1.4–7.0)
Neutrophils: 48 %
Platelets: 212 10*3/uL (ref 150–450)
RBC: 4.83 x10E6/uL (ref 3.77–5.28)
RDW: 13.3 % (ref 11.7–15.4)
WBC: 8.5 10*3/uL (ref 3.4–10.8)

## 2020-02-23 LAB — LIPID PANEL
Chol/HDL Ratio: 2.9 ratio (ref 0.0–4.4)
Cholesterol, Total: 131 mg/dL (ref 100–199)
HDL: 45 mg/dL
LDL Chol Calc (NIH): 60 mg/dL (ref 0–99)
Triglycerides: 155 mg/dL — ABNORMAL HIGH (ref 0–149)
VLDL Cholesterol Cal: 26 mg/dL (ref 5–40)

## 2020-02-23 LAB — VITAMIN D 25 HYDROXY (VIT D DEFICIENCY, FRACTURES): Vit D, 25-Hydroxy: 49.5 ng/mL (ref 30.0–100.0)

## 2020-02-23 LAB — HEMOGLOBIN A1C
Est. average glucose Bld gHb Est-mCnc: 226 mg/dL
Hgb A1c MFr Bld: 9.5 % — ABNORMAL HIGH (ref 4.8–5.6)

## 2020-02-23 LAB — TSH: TSH: 1.77 u[IU]/mL (ref 0.450–4.500)

## 2020-02-23 LAB — VITAMIN B12: Vitamin B-12: 746 pg/mL (ref 232–1245)

## 2020-02-23 LAB — CARDIOVASCULAR RISK ASSESSMENT

## 2020-02-23 NOTE — Progress Notes (Signed)
Glucose 178, kidney tests stage 3, one liver tests elevated ? Significance. A1c 9.5 very high- needs insulin , make appointment, triglycerides 155 up, TSH 1.77 normal, cbc normal, b12 746 normal. Vitamin d 49.5 in normal range lp

## 2020-02-24 ENCOUNTER — Other Ambulatory Visit: Payer: Self-pay

## 2020-02-24 ENCOUNTER — Ambulatory Visit (INDEPENDENT_AMBULATORY_CARE_PROVIDER_SITE_OTHER): Payer: Medicare Other | Admitting: Legal Medicine

## 2020-02-24 ENCOUNTER — Other Ambulatory Visit: Payer: Self-pay | Admitting: Cardiovascular Disease

## 2020-02-24 ENCOUNTER — Encounter: Payer: Self-pay | Admitting: Legal Medicine

## 2020-02-24 VITALS — BP 100/70 | HR 63 | Temp 97.6°F | Resp 16 | Ht 63.0 in | Wt 151.0 lb

## 2020-02-24 DIAGNOSIS — F321 Major depressive disorder, single episode, moderate: Secondary | ICD-10-CM | POA: Diagnosis not present

## 2020-02-24 DIAGNOSIS — E782 Mixed hyperlipidemia: Secondary | ICD-10-CM | POA: Diagnosis not present

## 2020-02-24 DIAGNOSIS — G8929 Other chronic pain: Secondary | ICD-10-CM

## 2020-02-24 DIAGNOSIS — I152 Hypertension secondary to endocrine disorders: Secondary | ICD-10-CM

## 2020-02-24 DIAGNOSIS — D6869 Other thrombophilia: Secondary | ICD-10-CM

## 2020-02-24 DIAGNOSIS — I10 Essential (primary) hypertension: Secondary | ICD-10-CM

## 2020-02-24 DIAGNOSIS — E1169 Type 2 diabetes mellitus with other specified complication: Secondary | ICD-10-CM | POA: Diagnosis not present

## 2020-02-24 DIAGNOSIS — Z7901 Long term (current) use of anticoagulants: Secondary | ICD-10-CM

## 2020-02-24 DIAGNOSIS — I714 Abdominal aortic aneurysm, without rupture, unspecified: Secondary | ICD-10-CM

## 2020-02-24 DIAGNOSIS — I480 Paroxysmal atrial fibrillation: Secondary | ICD-10-CM | POA: Diagnosis not present

## 2020-02-24 DIAGNOSIS — I701 Atherosclerosis of renal artery: Secondary | ICD-10-CM | POA: Insufficient documentation

## 2020-02-24 DIAGNOSIS — E1151 Type 2 diabetes mellitus with diabetic peripheral angiopathy without gangrene: Secondary | ICD-10-CM | POA: Insufficient documentation

## 2020-02-24 DIAGNOSIS — E669 Obesity, unspecified: Secondary | ICD-10-CM

## 2020-02-24 DIAGNOSIS — E1121 Type 2 diabetes mellitus with diabetic nephropathy: Secondary | ICD-10-CM | POA: Diagnosis not present

## 2020-02-24 DIAGNOSIS — Z794 Long term (current) use of insulin: Secondary | ICD-10-CM | POA: Insufficient documentation

## 2020-02-24 DIAGNOSIS — N1831 Chronic kidney disease, stage 3a: Secondary | ICD-10-CM

## 2020-02-24 DIAGNOSIS — E042 Nontoxic multinodular goiter: Secondary | ICD-10-CM | POA: Insufficient documentation

## 2020-02-24 DIAGNOSIS — M545 Low back pain, unspecified: Secondary | ICD-10-CM | POA: Diagnosis not present

## 2020-02-24 DIAGNOSIS — M8580 Other specified disorders of bone density and structure, unspecified site: Secondary | ICD-10-CM | POA: Insufficient documentation

## 2020-02-24 DIAGNOSIS — I69354 Hemiplegia and hemiparesis following cerebral infarction affecting left non-dominant side: Secondary | ICD-10-CM

## 2020-02-24 DIAGNOSIS — I2510 Atherosclerotic heart disease of native coronary artery without angina pectoris: Secondary | ICD-10-CM

## 2020-02-24 DIAGNOSIS — E1159 Type 2 diabetes mellitus with other circulatory complications: Secondary | ICD-10-CM

## 2020-02-24 MED ORDER — SERTRALINE HCL 50 MG PO TABS: 50.0000 mg | ORAL_TABLET | Freq: Every day | ORAL | 3 refills | Status: DC

## 2020-02-24 MED ORDER — ACETAMINOPHEN-CODEINE #3 300-30 MG PO TABS: 2.0000 | ORAL_TABLET | ORAL | 0 refills | Status: DC | PRN

## 2020-02-24 NOTE — Progress Notes (Signed)
Subjective:  Patient ID: Stacy [name], female    DOB: 01-14-1927  Age: 87 y.o. MRN: [id number]  Chief Complaint  Patient presents with  . Atrial Fibrillation  . Hyperlipidemia  . Hypertension  . Diabetes    HPI : chronic visit   Stacy [name] is a 87 y.o. female with a hx of coronary artery disease and stroke, presenting for follow-up evaluation. The patient initially presented in 1997 with an anterior wall MI treated with balloon angioplasty of the LAD. She later underwent stenting of the RCA in 2000. Her most recent heart catheterization in 2008 demonstrated patency of her angioplasty and stent sites in the LAD and RCA, respectively. The patient had a stroke in 2018. A loop recorder demonstrated paroxysmal atrial fibrillation from which the patient has not had any associated symptoms.     Patient present with type 2 diabetes.  Specifically, this is type 2, noninsulin requiring diabetes, complicated by hypertension and hypercholesterolemia, angiopathy, glomerulopathy.  Compliance with treatment has been good; patient take medicines as directed, maintains diet and exercise regimen, follows up as directed, and is keeping glucose diary.  Date of  diagnosis 2010.  Depression screen has been performed.Tobacco screen nonsmoker. Current medicines for diabetes none, patient unable to tolerate metformin but is seeing Dr. Buddy Duty her last A1c > 9.0..  Patient is on none for renal protection and cresor for cholesterol control.  Patient performs foot exams daily and last ophthalmologic exam was no. Patient presents for follow up of hypertension.  Patient tolerating metoprolol and dyazide well with side effects.  Patient was diagnosed with hypertension 2010 so has been treated for hypertension for 10 years.Patient is working on maintaining diet and exercise regimen and follows up as directed. Complication include none  Patient presents with hyperlipidemia.  Compliance with treatment has been good; patient  takes medicines as directed, maintains low cholesterol diet, follows up as directed, and maintains exercise regimen.  Patient is using crestor without problems..  Current Outpatient Medications on File Prior to Visit  Medication Sig Dispense Refill  . ACCU-CHEK SMARTVIEW test strip 1 each by Other route as needed for other (blood sugar test strips).   11  . cetirizine (ZYRTEC) 10 MG tablet TAKE 1 TABLET BY MOUTH EVERY DAY 90 tablet 2  . Cholecalciferol (VITAMIN D-3) 25 MCG (1000 UT) CAPS Take 1,000 Units by mouth 2 (two) times daily.    Marland Kitchen conjugated estrogens (PREMARIN) vaginal cream Place 1 g vaginally once a week. .625 mg once weekly    . ELIQUIS 5 MG TABS tablet TAKE 1 TABLET BY MOUTH TWICE A DAY 180 tablet 1  . ipratropium (ATROVENT) 0.06 % nasal spray ipratropium bromide 42 mcg (0.06 %) nasal spray  INSTILL 2 SPRAYS EACH NOSTRIL TWICE A DAY    . Melatonin 10 MG TABS Take 10 mg by mouth as needed.    . metFORMIN (GLUCOPHAGE) 500 MG tablet Take 500 mg by mouth daily with breakfast. Taking BID    . metoprolol succinate (TOPROL-XL) 25 MG 24 hr tablet TAKE 1 TABLET BY MOUTH EVERY DAY 90 tablet 3  . mometasone (ELOCON) 0.1 % cream mometasone 0.1 % topical cream  APPLY TO AFFECTED AREA EVERY DAY AS NEEDED ITCHING    . NEOMYCIN-POLYMYXIN-HYDROCORTISONE (CORTISPORIN) 1 % SOLN otic solution Place 2 drops into both ears as directed.  3  . nitroGLYCERIN (NITROSTAT) 0.4 MG SL tablet Place 1 tablet (0.4 mg total) under the tongue every 5 (five) minutes as needed for chest  pain. 25 tablet 3  . omeprazole (PRILOSEC OTC) 20 MG tablet Take 20 mg by mouth daily as needed (reflux).    . rosuvastatin (CRESTOR) 10 MG tablet TAKE 1 TABLET BY MOUTH EVERY DAY 90 tablet 3  . sertraline (ZOLOFT) 50 MG tablet Take 50 mg by mouth daily.    . vitamin B-12 (CYANOCOBALAMIN) 1000 MCG tablet Take 1,000 mcg by mouth daily.    . hydrochlorothiazide (MICROZIDE) 12.5 MG capsule Take 1 capsule (12.5 mg total) by mouth daily. 90  capsule 3   No current facility-administered medications on file prior to visit.   Past Medical History:  Diagnosis Date  . Anxiety   . Hyperlipidemia    Past Surgical History:  Procedure Laterality Date  . 3 epidural steroid injections    . APPENDECTOMY    . Basal Cell Cancer Removal     forehead  . CATARACT EXTRACTION    . CATARACT EXTRACTION  12/29/2003  . CORONARY ANGIOPLASTY  03-15-1995  . CORONARY ANGIOPLASTY WITH STENT PLACEMENT  05-19-1998  . CORONARY STENT PLACEMENT  2000  . DILATION AND CURETTAGE OF UTERUS    . ENDARTERECTOMY Right 04/13/2015   Procedure: ENDARTERECTOMY CAROTID;  Surgeon: Serafina Mitchell, MD;  Location: [facility] OR;  Service: Vascular;  Laterality: Right;  . ENDOMETRIAL BIOPSY    . EYE SURGERY    . heart catherization    . HEMORRHOID SURGERY    . INCISIONAL HERNIA REPAIR    . INGUINAL HERNIA REPAIR    . LOOP RECORDER INSERTION N/A 06/24/2016   Procedure: Loop Recorder Insertion;  Surgeon: Deboraha Sprang, MD;  Location: [facility];  Service: Cardiovascular;  Laterality: N/A;  . PATCH ANGIOPLASTY Right 04/13/2015   Procedure: PATCH ANGIOPLASTY;  Surgeon: Serafina Mitchell, MD;  Location: [facility] OR;  Service: Vascular;  Laterality: Right;  . PTCA    . SQUAMOUS CELL CARCINOMA EXCISION     left lower leg  . TEE WITHOUT CARDIOVERSION N/A 06/24/2016   Procedure: TRANSESOPHAGEAL ECHOCARDIOGRAM (TEE);  Surgeon: Dorothy Spark, MD;  Location: [facility] ENDOSCOPY;  Service: Cardiovascular;  Laterality: N/A;  . TONSILLECTOMY      Family History  Problem Relation Age of Onset  . Diabetes Father   . Prostate cancer Father        meta to colon  . Cancer Father        Prostate  . Parkinsonism Mother   . Stroke Paternal Uncle   . Hypertension Paternal Uncle   . Heart attack Neg Hx    Social History   Socioeconomic History  . Marital status: Widowed    Spouse name: Not on file  . Number of children: 1  . Years of education: Not on file  . Highest education  level: Not on file  Occupational History  . 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 Smoking status: Former Smoker    Packs/day: 1.00    Years: 54.00    Pack years: 54.00    Types: Cigarettes    Quit date: 10/05/1998    Years since quitting: 21.4  . Smokeless tobacco: Never Used  Vaping Use  . Vaping Use: Never used  Substance and Sexual Activity  . Alcohol use: Not Currently    Alcohol/week: 0.0 standard drinks  . Drug use: Never  . Sexual activity: Not Currently  Other Topics Concern  . Not on file  Social History Narrative  . Not  on file   Social Determinants of Health   Financial Resource Strain: Not on file  Food Insecurity: Not on file  Transportation Needs: Not on file  Physical Activity: Not on file  Stress: Not on file  Social Connections: Not on file    Review of Systems  Constitutional: Negative for activity change, diaphoresis and fever.  HENT: Negative for congestion.   Eyes: Negative for visual disturbance.  Respiratory: Negative for apnea, chest tightness and wheezing.   Cardiovascular: Negative for chest pain, palpitations and leg swelling.  Gastrointestinal: Negative for abdominal distention and abdominal pain.  Endocrine: Negative for polyuria.  Genitourinary: Negative for difficulty urinating and dyspareunia.  Musculoskeletal: Positive for back pain. Negative for arthralgias.  Skin: Negative.   Neurological: Negative for dizziness and facial asymmetry.  Psychiatric/Behavioral: Negative.      Objective:  BP 100/70   Pulse 63   Temp 97.6 F (36.4 C)   Resp 16   Ht 5\' 3"  (1.6 m)   Wt 151 lb (68.5 kg)   SpO2 95%   BMI 26.75 kg/m   BP/Weight 02/24/2020 01/31/2020 88/41/6606  Systolic BP 301 601 093  Diastolic BP 70 72 66  Wt. (Lbs) 151 151.6 152  BMI 26.75 26.43 26.5    Physical Exam Vitals reviewed.  Constitutional:      Appearance: Normal appearance.  HENT:     Right Ear: Tympanic  membrane, ear canal and external ear normal.     Left Ear: Tympanic membrane, ear canal and external ear normal.     Mouth/Throat:     Mouth: Mucous membranes are moist.     Pharynx: Oropharynx is clear.  Eyes:     Extraocular Movements: Extraocular movements intact.     Conjunctiva/sclera: Conjunctivae normal.     Pupils: Pupils are equal, round, and reactive to light.  Cardiovascular:     Rate and Rhythm: Normal rate and regular rhythm.     Pulses: Normal pulses.     Heart sounds: No murmur heard. No gallop.   Pulmonary:     Effort: Pulmonary effort is normal. No respiratory distress.     Breath sounds: No wheezing.  Abdominal:     General: Abdomen is flat. Bowel sounds are normal. There is no distension.     Tenderness: There is no abdominal tenderness.  Musculoskeletal:        General: Tenderness (back) present.     Cervical back: Normal range of motion and neck supple.  Skin:    General: Skin is warm.     Capillary Refill: Capillary refill takes less than 2 seconds.  Neurological:     General: No focal deficit present.     Mental Status: She is alert.  Psychiatric:        Mood and Affect: Mood normal.        Thought Content: Thought content normal.     Diabetic Foot Exam - Simple   Simple Foot Form Diabetic Foot exam was performed with the following findings: Yes 02/24/2020  3:19 PM  Visual Inspection No deformities, no ulcerations, no other skin breakdown bilaterally: Yes Sensation Testing Intact to touch and monofilament testing bilaterally: Yes Pulse Check Posterior Tibialis and Dorsalis pulse intact bilaterally: Yes Comments      Lab Results  Component Value Date   WBC 8.5 02/22/2020   HGB 13.8 02/22/2020   HCT 43.3 02/22/2020   PLT 212 02/22/2020   GLUCOSE 178 (H) 02/22/2020   CHOL 131 02/22/2020   TRIG 155 (  H) 02/22/2020   HDL 45 02/22/2020   LDLCALC 60 02/22/2020   ALT 23 02/22/2020   AST 48 (H) 02/22/2020   NA 137 02/22/2020   K 4.5 02/22/2020    CL 100 02/22/2020   CREATININE 1.04 (H) 02/22/2020   BUN 9 (L) 02/22/2020   CO2 24 02/22/2020   TSH 1.770 02/22/2020   INR 1.06 04/05/2015   HGBA1C 9.5 (H) 02/22/2020   MICROALBUR 10 04/21/2019      Assessment & Plan:   1. Essential hypertension An individual hypertension care plan was established and reinforced today.  The patient's status was assessed using clinical findings on exam and labs or diagnostic tests. The patient's success at meeting treatment goals on disease specific evidence-based guidelines and found to be well controlled. SELF MANAGEMENT: The patient and I together assessed ways to personally work towards obtaining the recommended goals. RECOMMENDATIONS: avoid decongestants found in common cold remedies, decrease consumption of alcohol, perform routine monitoring of BP with home BP cuff, exercise, reduction of dietary salt, take medicines as prescribed, try not to miss doses and quit smoking.  Regular exercise and maintaining a healthy weight is needed.  Stress reduction may help. A CLINICAL SUMMARY including written plan identify barriers to care unique to individual due to social or financial issues.  We attempt to mutually creat solutions for individual and family understanding.  2. Mixed hyperlipidemia AN INDIVIDUAL CARE PLAN for hyperlipidemia/ cholesterol was established and reinforced today.  The patient's status was assessed using clinical findings on exam, lab and other diagnostic tests. The patient's disease status was assessed based on evidence-based guidelines and found to be well controlled. MEDICATIONS were reviewed. SELF MANAGEMENT GOALS have been discussed and patient's success at attaining the goal of low cholesterol was assessed. RECOMMENDATION given include regular exercise 3 days a week and low cholesterol/low fat diet. CLINICAL SUMMARY including written plan to identify barriers unique to the patient due to social or economic  reasons was  discussed.  3. Chronic kidney disease, stage 3a ([place]) AN INDIVIDUAL CARE PLAN for renal failure was established and reinforced today.  The patient's status was assessed using clinical findings on exam, labs, and other diagnostic testing. Patient's success at meeting treatment goals based on disease specific evidence-bassed guidelines and found to be in good control. RECOMMENDATIONS include maintaining present medicines and treatment.  4. Obesity, diabetes, and hypertension syndrome ([place]) An individual care plan for diabetes was established and reinforced today.  The patient's status was assessed using clinical findings on exam, labs and diagnostic testing. Patient success at meeting goals based on disease specific evidence-based guidelines and found to be good controlled. Medications were assessed and patient's understanding of the medical issues , including barriers were assessed. Recommend adherence to a diabetic diet, a graduated exercise program, HgbA1c level is checked quarterly, and urine microalbumin performed yearly .  Annual mono-filament sensation testing performed. Lower blood pressure and control hyperlipidemia is important. Get annual eye exams and annual flu shots and smoking cessation discussed.  Self management goals were discussed. Patient stopped metformin, I recommedned insulin but I will awake Dr. Cindra Eves opinion  5. Long term (current) use of anticoagulants Patient is on chronic anticoagulants  6. Diabetic glomerulopathy [facility]) An individual care plan for diabetes was established and reinforced today.  The patient's status was assessed using clinical findings on exam, labs and diagnostic testing. Patient success at meeting goals based on disease specific evidence-based guidelines and found to be good controlled. Medications were assessed and patient's  understanding of the medical issues , including barriers were assessed. Recommend adherence to a diabetic diet, a graduated  exercise program, HgbA1c level is checked quarterly, and urine microalbumin performed yearly .  Annual mono-filament sensation testing performed. Lower blood pressure and control hyperlipidemia is important. Get annual eye exams and annual flu shots and smoking cessation discussed.  Self management goals were discussed.  7. Paroxysmal atrial fibrillation [facility]) Patient has a diagnosis of permanent atrial fibrillation.   Patient is on eliquis and has controlled ventricular response.  Patient is CV stable.  8. Depression, major, single episode, moderate ([facility]) - sertraline (ZOLOFT) 50 MG tablet; Take 1 tablet (50 mg total) by mouth daily.  Dispense: 30 tablet; Refill: 3 Patient's depression is poorly controlled, not taking medicine   Anhedonia better.  PHQ 9 was not performed score na. An individual care plan was established or reinforced today.  The patient's disease status was assessed using clinical findings on exam, labs, and or other diagnostic testing to determine patient's success in meeting treatment goals based on disease specific evidence-based guidelines and found to be stable Recommendations include be on zoloft 50mg  qd  9. Chronic midline low back pain without sciatica - acetaminophen-codeine (TYLENOL #3) 300-30 MG tablet; Take 2 tablets by mouth every 4 (four) hours as needed for moderate pain.  Dispense: 30 tablet; Refill: 0 Patient fell and has osteoporiss, use thylenol #3, get xray and may need MRI and referral to Dr. Joya Salm    Meds ordered this encounter  Medications  . sertraline (ZOLOFT) 50 MG tablet    Sig: Take 1 tablet (50 mg total) by mouth daily.    Dispense:  30 tablet    Refill:  3  . acetaminophen-codeine (TYLENOL #3) 300-30 MG tablet    Sig: Take 2 tablets by mouth every 4 (four) hours as needed for moderate pain.    Dispense:  30 tablet    Refill:  0        I spent 35 minutes dedicated to the care of this patient on the date of this encounter to include  face-to-face time with the patient, as well as: revieing old data  Follow-up: Return in about 1 month (around 03/26/2020).  An After Visit Summary was printed and given to the patient.  Reinaldo Meeker, MD [facility] [phone]

## 2020-03-17 ENCOUNTER — Other Ambulatory Visit: Payer: Self-pay | Admitting: Legal Medicine

## 2020-03-17 DIAGNOSIS — F321 Major depressive disorder, single episode, moderate: Secondary | ICD-10-CM

## 2020-03-20 ENCOUNTER — Ambulatory Visit (INDEPENDENT_AMBULATORY_CARE_PROVIDER_SITE_OTHER): Payer: Medicare Other

## 2020-03-20 DIAGNOSIS — I639 Cerebral infarction, unspecified: Secondary | ICD-10-CM | POA: Diagnosis not present

## 2020-03-21 ENCOUNTER — Telehealth: Payer: Self-pay | Admitting: Legal Medicine

## 2020-03-21 DIAGNOSIS — E1165 Type 2 diabetes mellitus with hyperglycemia: Secondary | ICD-10-CM | POA: Diagnosis not present

## 2020-03-21 DIAGNOSIS — Z7984 Long term (current) use of oral hypoglycemic drugs: Secondary | ICD-10-CM | POA: Diagnosis not present

## 2020-03-21 DIAGNOSIS — E1151 Type 2 diabetes mellitus with diabetic peripheral angiopathy without gangrene: Secondary | ICD-10-CM | POA: Diagnosis not present

## 2020-03-21 NOTE — Progress Notes (Signed)
  Chronic Care Management   Outreach Note  03/21/2020 Name: Stacy [name] MRN: [id number] DOB: April 23, 1926  Referred by: Lillard Anes, MD Reason for referral : Chronic Care Management   A second unsuccessful telephone outreach was attempted today. The patient was referred to pharmacist for assistance with care management and care coordination.  Follow Up Plan:   Hilario Quarry  Upstream Scheduler

## 2020-03-21 NOTE — Progress Notes (Signed)
  Chronic Care Management   Outreach Note  03/21/2020 Name: KASHAUNA CELMER MRN: [id number] DOB: [birth year]  Referred by: Lillard Anes, MD Reason for referral : Chronic Care Management   An unsuccessful telephone outreach was attempted today. The patient was referred to the pharmacist for assistance with care management and care coordination.   Follow Up Plan:   Hilario Quarry  Upstream Scheduler

## 2020-03-23 DIAGNOSIS — E042 Nontoxic multinodular goiter: Secondary | ICD-10-CM | POA: Diagnosis not present

## 2020-03-23 DIAGNOSIS — E1151 Type 2 diabetes mellitus with diabetic peripheral angiopathy without gangrene: Secondary | ICD-10-CM | POA: Diagnosis not present

## 2020-03-23 DIAGNOSIS — M8580 Other specified disorders of bone density and structure, unspecified site: Secondary | ICD-10-CM | POA: Diagnosis not present

## 2020-03-23 DIAGNOSIS — I2510 Atherosclerotic heart disease of native coronary artery without angina pectoris: Secondary | ICD-10-CM | POA: Diagnosis not present

## 2020-03-30 NOTE — Progress Notes (Signed)
Carelink Summary Report / Loop Recorder 

## 2020-04-20 ENCOUNTER — Telehealth: Payer: Self-pay | Admitting: Legal Medicine

## 2020-04-20 DIAGNOSIS — Z7984 Long term (current) use of oral hypoglycemic drugs: Secondary | ICD-10-CM | POA: Diagnosis not present

## 2020-04-20 DIAGNOSIS — E1151 Type 2 diabetes mellitus with diabetic peripheral angiopathy without gangrene: Secondary | ICD-10-CM | POA: Diagnosis not present

## 2020-04-20 NOTE — Progress Notes (Signed)
°  Chronic Care Management   Outreach Note  04/20/2020 Name: DARRIANA DEBOY MRN: [id number] DOB: 12-12-1926  Referred by: Lillard Anes, MD Reason for referral : Chronic Care Management   A second unsuccessful telephone outreach was attempted today. The patient was referred to pharmacist for assistance with care management and care coordination.  Follow Up Plan:   Hilario Quarry  Upstream Scheduler

## 2020-05-05 ENCOUNTER — Ambulatory Visit (HOSPITAL_COMMUNITY)
Admission: RE | Admit: 2020-05-05 | Discharge: 2020-05-05 | Disposition: A | Discharge status: Home / Self Care | Payer: Medicare Other | Source: Ambulatory Visit | Attending: Vascular Surgery | Admitting: Vascular Surgery

## 2020-05-05 ENCOUNTER — Other Ambulatory Visit: Payer: Self-pay

## 2020-05-05 ENCOUNTER — Ambulatory Visit (INDEPENDENT_AMBULATORY_CARE_PROVIDER_SITE_OTHER): Payer: Medicare Other | Admitting: Physician Assistant

## 2020-05-05 VITALS — BP 130/69 | HR 55 | Temp 98.2°F | Resp 20 | Ht 63.0 in | Wt 148.5 lb

## 2020-05-05 DIAGNOSIS — I639 Cerebral infarction, unspecified: Secondary | ICD-10-CM

## 2020-05-05 DIAGNOSIS — I6523 Occlusion and stenosis of bilateral carotid arteries: Secondary | ICD-10-CM | POA: Diagnosis not present

## 2020-05-05 NOTE — Progress Notes (Signed)
HISTORY AND PHYSICAL     CC:  follow up. Requesting Provider:  Lillard Anes,*  HPI: This is a 87 y.o. female here for follow up for carotid artery stenosis.  Pt is s/p right CEA for asymptomatic carotid artery stenosis on 04/13/2015 by Dr. Trula Slade.  She has hx of CAD with hx of stenting.  She has hx of PAF and on Eliquis.   Pt was last seen 05/05/2019 and at that time she was not having any neurologic sx. She has a hx of HTN, HLD, CKD 3a  Pt returns today for follow up and accompanied by her daughter Rodena Piety (works in the [place] at [employer]).  She states she is doing well and lives independent.    Pt denies any amaurosis fugax, speech difficulties, weakness, numbness, paralysis or clumsiness or facial droop.    The pt is on a statin for cholesterol management.  The pt is not on a daily aspirin.   Other AC:  Eliquis The pt is on BB for hypertension.   The pt is diabetic.   Tobacco hx:  former   Past Medical History:  Diagnosis Date  . Anxiety   . Hyperlipidemia     Past Surgical History:  Procedure Laterality Date  . 3 epidural steroid injections    . APPENDECTOMY    . Basal Cell Cancer Removal     forehead  . CATARACT EXTRACTION    . CATARACT EXTRACTION  12/29/2003  . CORONARY ANGIOPLASTY  03-15-1995  . CORONARY ANGIOPLASTY WITH STENT PLACEMENT  05-19-1998  . CORONARY STENT PLACEMENT  2000  . DILATION AND CURETTAGE OF UTERUS    . ENDARTERECTOMY Right 04/13/2015   Procedure: ENDARTERECTOMY CAROTID;  Surgeon: Serafina Mitchell, MD;  Location: [facility] OR;  Service: Vascular;  Laterality: Right;  . ENDOMETRIAL BIOPSY    . EYE SURGERY    . heart catherization    . HEMORRHOID SURGERY    . INCISIONAL HERNIA REPAIR    . INGUINAL HERNIA REPAIR    . LOOP RECORDER INSERTION N/A 06/24/2016   Procedure: Loop Recorder Insertion;  Surgeon: Deboraha Sprang, MD;  Location: [facility];  Service: Cardiovascular;  Laterality: N/A;  . PATCH ANGIOPLASTY Right 04/13/2015    Procedure: PATCH ANGIOPLASTY;  Surgeon: Serafina Mitchell, MD;  Location: [facility] OR;  Service: Vascular;  Laterality: Right;  . PTCA    . SQUAMOUS CELL CARCINOMA EXCISION     left lower leg  . TEE WITHOUT CARDIOVERSION N/A 06/24/2016   Procedure: TRANSESOPHAGEAL ECHOCARDIOGRAM (TEE);  Surgeon: Dorothy Spark, MD;  Location: [facility] ENDOSCOPY;  Service: Cardiovascular;  Laterality: N/A;  . TONSILLECTOMY      Allergies  Allergen Reactions  . Iodinated Diagnostic Agents Anaphylaxis and Rash  . Iohexol Other (See Comments)     Desc: HIVES- 13 HR PRE-MEDS ARE REQUIRED-ASM- 03/21/05,SULFA,ADHESIVE TAPE   . Sulfonamide Derivatives Hives    REACTION: hives  . Latex Other (See Comments)    Adhesive tape and EKG adhesive leads to rash  . Tranilast Hives    Investigational medication  . Tape Other (See Comments)    Adhesive tap redness  . Sulfa Antibiotics Rash    Current Outpatient Medications  Medication Sig Dispense Refill  . ACCU-CHEK SMARTVIEW test strip 1 each by Other route as needed for other (blood sugar test strips).   11  . acetaminophen-codeine (TYLENOL #3) 300-30 MG tablet Take 2 tablets by mouth every 4 (four) hours as needed for moderate  pain. 30 tablet 0  . cetirizine (ZYRTEC) 10 MG tablet TAKE 1 TABLET BY MOUTH EVERY DAY 90 tablet 2  . Cholecalciferol (VITAMIN D-3) 25 MCG (1000 UT) CAPS Take 1,000 Units by mouth 2 (two) times daily.    Marland Kitchen conjugated estrogens (PREMARIN) vaginal cream Place 1 g vaginally once a week. .625 mg once weekly    . ELIQUIS 5 MG TABS tablet TAKE 1 TABLET BY MOUTH TWICE A DAY 180 tablet 1  . hydrochlorothiazide (MICROZIDE) 12.5 MG capsule TAKE 1 CAPSULE BY MOUTH EVERY DAY 90 capsule 3  . ipratropium (ATROVENT) 0.06 % nasal spray ipratropium bromide 42 mcg (0.06 %) nasal spray  INSTILL 2 SPRAYS EACH NOSTRIL TWICE A DAY    . Melatonin 10 MG TABS Take 10 mg by mouth as needed.    . metFORMIN (GLUCOPHAGE) 500 MG tablet Take 500 mg by mouth daily with breakfast.  Taking BID    . metoprolol succinate (TOPROL-XL) 25 MG 24 hr tablet TAKE 1 TABLET BY MOUTH EVERY DAY 90 tablet 3  . mometasone (ELOCON) 0.1 % cream mometasone 0.1 % topical cream  APPLY TO AFFECTED AREA EVERY DAY AS NEEDED ITCHING    . NEOMYCIN-POLYMYXIN-HYDROCORTISONE (CORTISPORIN) 1 % SOLN otic solution Place 2 drops into both ears as directed.  3  . nitroGLYCERIN (NITROSTAT) 0.4 MG SL tablet Place 1 tablet (0.4 mg total) under the tongue every 5 (five) minutes as needed for chest pain. 25 tablet 3  . omeprazole (PRILOSEC OTC) 20 MG tablet Take 20 mg by mouth daily as needed (reflux).    . rosuvastatin (CRESTOR) 10 MG tablet TAKE 1 TABLET BY MOUTH EVERY DAY 90 tablet 3  . sertraline (ZOLOFT) 50 MG tablet Take 50 mg by mouth daily.    . sertraline (ZOLOFT) 50 MG tablet TAKE 1 TABLET BY MOUTH EVERY DAY 90 tablet 2  . vitamin B-12 (CYANOCOBALAMIN) 1000 MCG tablet Take 1,000 mcg by mouth daily.     No current facility-administered medications for this visit.    Family History  Problem Relation Age of Onset  . Diabetes Father   . Prostate cancer Father        meta to colon  . Cancer Father        Prostate  . Parkinsonism Mother   . Stroke Paternal Uncle   . Hypertension Paternal Uncle   . Heart attack Neg Hx     Social History   Socioeconomic History  . Marital status: Widowed    Spouse name: Not on file  . Number of children: 1  . Years of education: Not on file  . Highest education level: Not on file  Occupational History  . 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 Smoking status: Former Smoker    Packs/day: 1.00    Years: 54.00    Pack years: 54.00    Types: Cigarettes    Quit date: 10/05/1998    Years since quitting: 21.5  . Smokeless tobacco: Never Used  Vaping Use  . Vaping Use: Never used  Substance and Sexual Activity  . Alcohol use: Not Currently    Alcohol/week: 0.0 standard drinks  . Drug use: Never  . Sexual  activity: Not Currently  Other Topics Concern  . Not on file  Social History Narrative  . Not on file   Social Determinants of Health   Financial Resource Strain: Not on file  Food Insecurity: Not on file  Transportation Needs: Not on file  Physical Activity: Not on file  Stress: Not on file  Social Connections: Not on file  Intimate Partner Violence: Not on file     REVIEW OF SYSTEMS:   _0  denotes positive finding, _1  denotes negative finding Cardiac  Comments:  Chest pain or chest pressure:    Shortness of breath upon exertion:    Short of breath when lying flat:    Irregular heart rhythm:        Vascular    Pain in calf, thigh, or hip brought on by ambulation:    Pain in feet at night that wakes you up from your sleep:     Blood clot in your veins:    Leg swelling:         Pulmonary    Oxygen at home:    Productive cough:     Wheezing:         Neurologic    Sudden weakness in arms or legs:     Sudden numbness in arms or legs:     Sudden onset of difficulty speaking or slurred speech:    Temporary loss of vision in one eye:     Problems with dizziness:         Gastrointestinal    Blood in stool:     Vomited blood:         Genitourinary    Burning when urinating:     Blood in urine:        Psychiatric    Major depression:         Hematologic    Bleeding problems:    Problems with blood clotting too easily:        Skin    Rashes or ulcers:        Constitutional    Fever or chills:      PHYSICAL EXAMINATION:  Today's Vitals   05/05/20 1344 05/05/20 1346  BP: 123/65 130/69  Pulse: (!) 55   Resp: 20   Temp: 98.2 F (36.8 C)   TempSrc: Temporal   SpO2: 95%   Weight: 148 lb 8 oz (67.4 kg)   Height: _2  (1.6 m)    Body mass index is 26.31 kg/m.   General:  WDWN in NAD; vital signs documented above Gait: Not observed HENT: WNL, normocephalic Pulmonary: normal non-labored breathing Cardiac: regular HR, without carotid  bruits Abdomen: soft, NT; aortic pulse is not palpable Skin: without rashes Vascular Exam/Pulses:  Right Left  Radial 2+ (normal) 2+ (normal)  DP 2+ (normal) 2+ (normal)  PT Unable to palpate Unable to palpate   Extremities: without ischemic changes, without Gangrene , without cellulitis; without open wounds Musculoskeletal: no muscle wasting or atrophy  Neurologic: A&O X 3; moving all extremities equally; speech is fluent/normal Psychiatric:  The pt has Normal affect.   Non-Invasive Vascular Imaging:   Carotid Duplex on 05/05/2020: Right:  1-39% ICA stenosis Left:  1-39% ICA stenosis Vertebrals: Bilateral vertebral arteries demonstrate antegrade flow.  Subclavians: Normal flow hemodynamics were seen in bilateral subclavian arteries.  Previous Carotid duplex on 05/05/2019: Right: 1-39% ICA stenosis Left:   1-39% ICA stenosis    ASSESSMENT/PLAN:: 87 y.o. female here for follow up carotid artery stenosis and is s/p right CEA for asymptomatic carotid artery stenosis on 04/13/2015 by Dr. Trula Slade.  -duplex today reveals 1-39% bilaterally and is unchanged over the past several years.  She remains asymptomatic.   -discussed s/s of stroke with pt and  she understands should she develop any of these sx, she will go to the nearest ER or call 911. -discussed with her and her daughter that we can push her appt out a couple of years but they feel more comfortable coming yearly so therefore, pt will f/u in one year with carotid duplex -pt will call sooner should they have any issues. -continue statin  Leontine Locket, [facility] [phone]  Clinic MD:  Donzetta Matters

## 2020-05-16 ENCOUNTER — Telehealth: Payer: Self-pay | Admitting: Legal Medicine

## 2020-05-16 NOTE — Progress Notes (Signed)
  Chronic Care Management   Note  05/16/2020 Name: Stacy [name] MRN: [id number] DOB: 02-Aug-1926  Stacy [name] is a 87 y.o. year old female who is a primary care patient of Annastyn, Silvey, MD. I reached out to [name] by phone today in response to a referral sent by Ms. Rafeef P Etienne's PCP, Lillard Anes, MD.   Stacy [name] was given information about Chronic Care Management services today including:  1. CCM service includes personalized support from designated clinical staff supervised by her physician, including individualized plan of care and coordination with other care providers 2. 24/7 contact phone numbers for assistance for urgent and routine care needs. 3. Service will only be billed when office clinical staff spend 20 minutes or more in a month to coordinate care. 4. Only one practitioner may furnish and bill the service in a calendar month. 5. The patient may stop CCM services at any time (effective at the end of the month) by phone call to the office staff.   Patient agreed to services and verbal consent obtained.   Follow up plan:   Carley Perdue [employer] Scheduler

## 2020-06-01 DIAGNOSIS — M170 Bilateral primary osteoarthritis of knee: Secondary | ICD-10-CM | POA: Diagnosis not present

## 2020-06-06 DIAGNOSIS — D2239 Melanocytic nevi of other parts of face: Secondary | ICD-10-CM | POA: Diagnosis not present

## 2020-06-06 DIAGNOSIS — D1801 Hemangioma of skin and subcutaneous tissue: Secondary | ICD-10-CM | POA: Diagnosis not present

## 2020-06-06 DIAGNOSIS — Z85828 Personal history of other malignant neoplasm of skin: Secondary | ICD-10-CM | POA: Diagnosis not present

## 2020-06-06 DIAGNOSIS — L821 Other seborrheic keratosis: Secondary | ICD-10-CM | POA: Diagnosis not present

## 2020-06-06 DIAGNOSIS — L304 Erythema intertrigo: Secondary | ICD-10-CM | POA: Diagnosis not present

## 2020-06-06 DIAGNOSIS — L814 Other melanin hyperpigmentation: Secondary | ICD-10-CM | POA: Diagnosis not present

## 2020-06-06 DIAGNOSIS — L570 Actinic keratosis: Secondary | ICD-10-CM | POA: Diagnosis not present

## 2020-06-08 DIAGNOSIS — M170 Bilateral primary osteoarthritis of knee: Secondary | ICD-10-CM | POA: Diagnosis not present

## 2020-06-15 DIAGNOSIS — M170 Bilateral primary osteoarthritis of knee: Secondary | ICD-10-CM | POA: Diagnosis not present

## 2020-06-19 ENCOUNTER — Telehealth: Payer: Self-pay

## 2020-06-19 NOTE — Progress Notes (Signed)
Chronic Care Management Pharmacy Assistant   Name: MIEKO KNEEBONE  MRN: [id number] DOB: 03-Apr-1926  Stacy [name] is an 87 y.o. year old female who presents for his initial CCM visit with the clinical pharmacist.   Conditions to be addressed/monitored: Atrial Fibrillation, HTN, HLD and CKD Stage 3, DM, stroke  Recent office visits:  05/05/20-Vascular surgery, Bilateral carotid artery stenosis, carotid artery stenosis and is s/p right CEA for asymptomatic carotid artery stenosis on 04/13/2015 by Dr. Trula Slade. -duplex today reveals 1-39% bilaterally and is unchanged over the past several years.  She remains asymptomatic.   -discussed s/s of stroke with pt and she understands should she develop any of these sx, she will go to the nearest ER or call 911. -discussed with her and her daughter that we can push her appt out a couple of years but they feel more comfortable coming yearly so therefore, pt will f/u in one year with carotid duplex -pt will call sooner should they have any issues.  -continue statin  04/20/20-[facility], DM, 3 mth f/u, jardiance, JK: BP CHECK, COVID screen 07-2019, JK:See order, JK: BP CHECK, JK:See order, 1 mth f/u, 1 mth f/u, 3 Month follow up , Refill Accu-Chek Guide test strips, diabetes, JK: B-12 INJECTION, JK: B-12 Injection, Vitamin B12 appt needed, JK: b-12 injection, B12 injection, labs, 6 month diabetes follow up, 6 month diabetes follow up , Metformin Rx, diabetes followup, diabetes followup, Call , 4 month diabetes and thyroid follow up, JK/ Bp check and lab work, JK/ See Orders, Jardiance, Diabetes and Thyroid Care, JK/ See Orders, JK/ See Orders, lab / Letter mailed 8/10, 6 month diabetes care, JK/ See Orders/ FASTING, Needs call back from office, 6 month diabetes followup, 6 month diabetes followup, Test , Fosamax, DXA report, Ref Dr. Buddy Duty DXA Hip and Spine and if indicated VFA, 6 month diabetes followup, 6 mths diabetes and thyroid care, Refill  Accu-Chek Smartview test strips, follow up , follow up , Already had flu vaccine, Meter/test strips, diabetes and thyroid followup, Lab review, 6 month follow up, 3 month follow up, 69mof/u. mcg, 657mo/u. mcg, bh- 6 mo fu on dm, Annual Exam, Rx refill, Refill on Metformin, 6 mo f/u-smg, U/S Bloating, Repeat BMD, 6 month f/u-per dr keIdelle Jo6 month f/u-per dr keIdelle JoCHANGE TO ANOTHER DR., [facility] Ex[facility], [facility] from balan-diabetes-abt  04/20/20-[facility], 3 mth f/u, jardiance, JK: BP CHECK, COVID screen 07-2019, JK:See order, JK: BP CHECK, JK:See order, 1 mth f/u, 1 mth f/u, 3 Month follow up , Refill Accu-Chek Guide test strips, diabetes, JK: B-12 INJECTION, JK: B-12 Injection, Vitamin B12 appt needed, JK: b-12 injection, B12 injection, labs, 6 month diabetes follow up, 6 month diabetes follow up , Metformin Rx, diabetes followup, diabetes followup, Call , 4 month diabetes and thyroid follow up, JK/ Bp check and lab work, JK/ See Orders, Jardiance, Diabetes and Thyroid Care, JK/ See Orders, JK/ See Orders, lab / Letter mailed 8/10, 6 month diabetes care, JK/ See Orders/ FASTING, Needs call back from office, 6 month diabetes followup, 6 month diabetes followup, Test , Fosamax, DXA report, Ref Dr. KeBuddy DutyXA Hip and Spine and if indicated VFA, 6 month diabetes followup, 6 mths diabetes and thyroid care, Refill Accu-Chek Smartview test strips, follow up , follow up , Already had flu vaccine, Meter/test strips, diabetes and thyroid followup, Lab review, 6 month follow up, 3 month follow up, 41m49mou. mcg, 41mo52mo.  mcg, bh- 6 mo fu on dm, Annual Exam, Rx refill, Refill on Metformin, 6 mo f/u-smg, U/S Bloating, Repeat BMD, 6 month f/u-per dr Idelle Jo, 6 month f/u-per dr Idelle Jo, CHANGE TO ANOTHER DR., Annual Exam, [facility] Preload, trans from balan-diabetes   04/20/20-[facility], 3 mth f/u, jardiance, JK: BP CHECK, COVID screen 07-2019, JK:See order, JK:  BP CHECK, JK:See order, 1 mth f/u, 1 mth f/u, 3 Month follow up , Refill Accu-Chek Guide test strips, diabetes, JK: B-12 INJECTION, JK: B-12 Injection, Vitamin B12 appt needed, JK: b-12 injection, B12 injection, labs, 6 month diabetes follow up, 6 month diabetes follow up , Metformin Rx, diabetes followup, diabetes followup, Call , 4 month diabetes and thyroid follow up, JK/ Bp check and lab work, JK/ See Orders, Jardiance, Diabetes and Thyroid Care, JK/ See Orders, JK/ See Orders, lab / Letter mailed 8/10, 6 month diabetes care, JK/ See Orders/ FASTING, Needs call back from office, 6 month diabetes followup, 6 month diabetes followup, Test , Fosamax, DXA report, Ref Dr. Buddy Duty DXA Hip and Spine and if indicated VFA, 6 month diabetes followup, 6 mths diabetes and thyroid care, Refill Accu-Chek Smartview test strips, follow up , follow up , Already had flu vaccine, Meter/test strips, diabetes and thyroid followup, Lab review, 6 month follow up, 3 month follow up, 80mof/u. mcg, 651mo/u. mcg, bh- 6 mo fu on dm, Annual Exam, Rx refill, Refill on Metformin, 6 mo f/u-smg, U/S Bloating, Repeat BMD, 6 month f/u-per dr keIdelle Jo6 month f/u-per dr keIdelle JoCHANGE TO ANOTHER DR., [facility] Exam[facility], [facility] from bal[facility]   03/20/20-Emergency Medicine, Cardiology,  Acute ischemic stroke,  No information given in note  02/23/20-Labs, Glucose 178, kidney tests stage 3, one liver tests elevated ? Significance. A1c 9.5 very high- needs insulin , make appointment, triglycerides 155 up, TSH 1.77 normal, cbc normal, b12 746 normal. Vitamin d 49.5 in normal range lp  02/24/20-Dr. PeHenrene PastorCP, HTN, Start acetaminophen-codeine (TYLENOL #3) 300-30 MG tablet avoid decongestants found in common cold remedies, decrease consumption of alcohol, perform routine monitoring of BP with home BP cuff, exercise, reduction of dietary salt, take medicines as prescribed, try not to miss doses and quit smoking.  Regular exercise  and maintaining a healthy weight is needed.  Stress reduction may help. A CLINICAL SUMMARY including written plan identify barriers to care unique to individual due to social or financial issues.  We attempt to mutually creat solutions for individual and family understanding. given include regular exercise 3 days a week and low cholesterol/low fat diet. Patient fell and has osteoporiss, use thylenol #3, get xray and may need MRI and referral to Dr. BoJoya Salm1/3/22-Cardiology, Contacted patient by phone in reference to  Possible Medtronic ILR explant due to device has reached RRT. Patient states that she doe not wish to have the device explanted at this time but if she changes her mind that she will [phone] patient that she would be receiving a return kit for the monitor in the mail and that she could unplug her home monitoring device.  02/01/20-Cardiology, CUP PACEART REMOTE DEVICE CHECK,   01/31/20-Cardiology, Dr. CoBurt KnackLow atrial fibrillation burden noted (2%).  Appears to be asymptomatic except for an episode of fatigue/lethargy around the time she had her influenza vaccine when she was noted to be in atrial fibrillation.  Tolerating oral anticoagulation with apixaban. No angina on current medical therapy.  Extensive coronary disease history noted with  remote PCI procedures performed.  Tolerating anticoagulation.  Also on a beta-blocker and statin drug. Treated with rosuvastatin.  Last lipids demonstrated an LDL cholesterol 56 mg/dL, HDL 44, triglycerides 146, total cholesterol 125.  ALT was normal at 23. Blood pressure well controlled on metoprolol succinate and hydrochlorothiazide.   01/04/20-Cardiology, CUP PACEART INCLINIC DEVICE CHECK,   Continue on Eliquis. Denies bleeding,Overall AF burden ~2%, Normal device function, See Pace Art report, No changes today  Recent consult visits:  none  Hospital visits:  None in previous 6  months  Medications: Outpatient Encounter Medications as of 06/19/2020  Medication Sig  . ACCU-CHEK SMARTVIEW test strip 1 each by Other route as needed for other (blood sugar test strips).   Marland Kitchen acetaminophen-codeine (TYLENOL #3) 300-30 MG tablet Take 2 tablets by mouth every 4 (four) hours as needed for moderate pain.  . cetirizine (ZYRTEC) 10 MG tablet TAKE 1 TABLET BY MOUTH EVERY DAY  . Cholecalciferol (VITAMIN D-3) 25 MCG (1000 UT) CAPS Take 1,000 Units by mouth 2 (two) times daily.  Marland Kitchen conjugated estrogens (PREMARIN) vaginal cream Place 1 g vaginally once a week. .625 mg once weekly  . ELIQUIS 5 MG TABS tablet TAKE 1 TABLET BY MOUTH TWICE A DAY  . empagliflozin (JARDIANCE) 10 MG TABS tablet 1 tablet  . hydrochlorothiazide (MICROZIDE) 12.5 MG capsule TAKE 1 CAPSULE BY MOUTH EVERY DAY  . ipratropium (ATROVENT) 0.06 % nasal spray ipratropium bromide 42 mcg (0.06 %) nasal spray  INSTILL 2 SPRAYS EACH NOSTRIL TWICE A DAY  . Melatonin 10 MG TABS Take 10 mg by mouth as needed.  . metFORMIN (GLUCOPHAGE) 500 MG tablet Take 500 mg by mouth daily with breakfast. Taking BID  . metoprolol succinate (TOPROL-XL) 25 MG 24 hr tablet TAKE 1 TABLET BY MOUTH EVERY DAY  . mometasone (ELOCON) 0.1 % cream mometasone 0.1 % topical cream  APPLY TO AFFECTED AREA EVERY DAY AS NEEDED ITCHING  . NEOMYCIN-POLYMYXIN-HYDROCORTISONE (CORTISPORIN) 1 % SOLN otic solution Place 2 drops into both ears as directed.  . nitroGLYCERIN (NITROSTAT) 0.4 MG SL tablet Place 1 tablet (0.4 mg total) under the tongue every 5 (five) minutes as needed for chest pain.  Marland Kitchen omeprazole (PRILOSEC OTC) 20 MG tablet Take 20 mg by mouth daily as needed (reflux).  . rosuvastatin (CRESTOR) 10 MG tablet TAKE 1 TABLET BY MOUTH EVERY DAY  . sertraline (ZOLOFT) 50 MG tablet Take 50 mg by mouth daily.  . sertraline (ZOLOFT) 50 MG tablet TAKE 1 TABLET BY MOUTH EVERY DAY  . vitamin B-12 (CYANOCOBALAMIN) 1000 MCG tablet Take 1,000 mcg by mouth daily.   No  facility-administered encounter medications on file as of 06/19/2020.     Lab Results  Component Value Date/Time   HGBA1C 9.5 (H) 02/22/2020 10:58 AM   HGBA1C 8.4 (H) 08/31/2019 10:00 AM   MICROALBUR 10 04/21/2019 03:14 PM     BP Readings from Last 3 Encounters:  05/05/20 130/69  02/24/20 100/70  01/31/20 126/72      . Have you seen any other providers since your last visit with PCP? Yes  . Any changes in your medications or health?   Marland Kitchen Any side effects from any medications?   . Do you have an symptoms or problems not managed by your medications?   Marland Kitchen Any concerns about your health right now?  Marland Kitchen Has your provider asked that you check blood pressure, blood sugar, or follow special diet at home?   Marland Kitchen Do you get any type of exercise  on a regular basis?   . Can you think of a goal you would like to reach for your health?   . Do you have any problems getting your medications?  o Patient's preferred pharmacy is:  [place], [place] [address] Phone: [phone] Fax: [phone]  [id number] [place], [address]: [phone]: [phone]  . Is there anything that you would like to discuss during the appointment?     Star Rating Drugs:  Medication:  Last Fill: Day Supply Metoprolol  03/20/20  [place]burgPharmacist Assistant [phone]

## 2020-06-20 ENCOUNTER — Ambulatory Visit: Payer: Medicare Other

## 2020-06-20 DIAGNOSIS — I152 Hypertension secondary to endocrine disorders: Secondary | ICD-10-CM

## 2020-06-20 DIAGNOSIS — I10 Essential (primary) hypertension: Secondary | ICD-10-CM | POA: Diagnosis not present

## 2020-06-20 DIAGNOSIS — E1159 Type 2 diabetes mellitus with other circulatory complications: Secondary | ICD-10-CM | POA: Diagnosis not present

## 2020-06-20 DIAGNOSIS — E119 Type 2 diabetes mellitus without complications: Secondary | ICD-10-CM

## 2020-06-20 DIAGNOSIS — E669 Obesity, unspecified: Secondary | ICD-10-CM

## 2020-06-20 DIAGNOSIS — E782 Mixed hyperlipidemia: Secondary | ICD-10-CM

## 2020-06-20 DIAGNOSIS — E1169 Type 2 diabetes mellitus with other specified complication: Secondary | ICD-10-CM | POA: Diagnosis not present

## 2020-06-21 LAB — CBC WITH DIFFERENTIAL/PLATELET
Basophils Absolute: 0.1 10*3/uL (ref 0.0–0.2)
Basos: 1 %
EOS (ABSOLUTE): 0.2 10*3/uL (ref 0.0–0.4)
Eos: 3 %
Hematocrit: 43.6 % (ref 34.0–46.6)
Hemoglobin: 13.4 g/dL (ref 11.1–15.9)
Immature Grans (Abs): 0 10*3/uL (ref 0.0–0.1)
Immature Granulocytes: 0 %
Lymphocytes Absolute: 2.5 10*3/uL (ref 0.7–3.1)
Lymphs: 27 %
MCH: 27 pg (ref 26.6–33.0)
MCHC: 30.7 g/dL — ABNORMAL LOW (ref 31.5–35.7)
MCV: 88 fL (ref 79–97)
Monocytes Absolute: 0.8 10*3/uL (ref 0.1–0.9)
Monocytes: 9 %
Neutrophils Absolute: 5.5 10*3/uL (ref 1.4–7.0)
Neutrophils: 60 %
Platelets: 231 10*3/uL (ref 150–450)
RBC: 4.97 x10E6/uL (ref 3.77–5.28)
RDW: 13.7 % (ref 11.7–15.4)
WBC: 9.1 10*3/uL (ref 3.4–10.8)

## 2020-06-21 LAB — COMPREHENSIVE METABOLIC PANEL
ALT: 38 IU/L — ABNORMAL HIGH (ref 0–32)
AST: 85 IU/L — ABNORMAL HIGH (ref 0–40)
Albumin/Globulin Ratio: 1.4 (ref 1.2–2.2)
Albumin: 3.7 g/dL (ref 3.5–4.6)
Alkaline Phosphatase: 203 IU/L — ABNORMAL HIGH (ref 44–121)
BUN/Creatinine Ratio: 9 — ABNORMAL LOW (ref 12–28)
BUN: 10 mg/dL (ref 10–36)
Bilirubin Total: 1 mg/dL (ref 0.0–1.2)
CO2: 23 mmol/L (ref 20–29)
Calcium: 9.4 mg/dL (ref 8.7–10.3)
Chloride: 100 mmol/L (ref 96–106)
Creatinine, Ser: 1.09 mg/dL — ABNORMAL HIGH (ref 0.57–1.00)
Globulin, Total: 2.6 g/dL (ref 1.5–4.5)
Glucose: 141 mg/dL — ABNORMAL HIGH (ref 65–99)
Potassium: 4.3 mmol/L (ref 3.5–5.2)
Sodium: 139 mmol/L (ref 134–144)
Total Protein: 6.3 g/dL (ref 6.0–8.5)
eGFR: 48 mL/min/{1.73_m2} — ABNORMAL LOW (ref >59)

## 2020-06-21 LAB — LIPID PANEL
Chol/HDL Ratio: 2.6 ratio (ref 0.0–4.4)
Cholesterol, Total: 107 mg/dL (ref 100–199)
HDL: 41 mg/dL (ref >39)
LDL Chol Calc (NIH): 47 mg/dL (ref 0–99)
Triglycerides: 104 mg/dL (ref 0–149)
VLDL Cholesterol Cal: 19 mg/dL (ref 5–40)

## 2020-06-21 LAB — HEMOGLOBIN A1C
Est. average glucose Bld gHb Est-mCnc: 174 mg/dL
Hgb A1c MFr Bld: 7.7 % — ABNORMAL HIGH (ref 4.8–5.6)

## 2020-06-21 LAB — CARDIOVASCULAR RISK ASSESSMENT

## 2020-06-21 NOTE — Progress Notes (Signed)
Glucose 141, kidneys stage 3b- stable, liver tests elevated- recommend ultrasound of upper abdomen, A1c 7.7, cholesterol good, cbc normal,

## 2020-06-22 ENCOUNTER — Other Ambulatory Visit: Payer: Self-pay

## 2020-06-22 ENCOUNTER — Encounter: Payer: Self-pay | Admitting: Legal Medicine

## 2020-06-22 ENCOUNTER — Ambulatory Visit (INDEPENDENT_AMBULATORY_CARE_PROVIDER_SITE_OTHER): Payer: Medicare Other | Admitting: Legal Medicine

## 2020-06-22 VITALS — BP 130/72 | HR 72 | Temp 98.6°F | Resp 16 | Wt 148.0 lb

## 2020-06-22 DIAGNOSIS — I69354 Hemiplegia and hemiparesis following cerebral infarction affecting left non-dominant side: Secondary | ICD-10-CM | POA: Diagnosis not present

## 2020-06-22 DIAGNOSIS — H906 Mixed conductive and sensorineural hearing loss, bilateral: Secondary | ICD-10-CM

## 2020-06-22 DIAGNOSIS — N1831 Chronic kidney disease, stage 3a: Secondary | ICD-10-CM | POA: Diagnosis not present

## 2020-06-22 DIAGNOSIS — E669 Obesity, unspecified: Secondary | ICD-10-CM | POA: Diagnosis not present

## 2020-06-22 DIAGNOSIS — K2100 Gastro-esophageal reflux disease with esophagitis, without bleeding: Secondary | ICD-10-CM | POA: Diagnosis not present

## 2020-06-22 DIAGNOSIS — E1151 Type 2 diabetes mellitus with diabetic peripheral angiopathy without gangrene: Secondary | ICD-10-CM | POA: Diagnosis not present

## 2020-06-22 DIAGNOSIS — E785 Hyperlipidemia, unspecified: Secondary | ICD-10-CM | POA: Diagnosis not present

## 2020-06-22 DIAGNOSIS — I10 Essential (primary) hypertension: Secondary | ICD-10-CM | POA: Diagnosis not present

## 2020-06-22 DIAGNOSIS — E1159 Type 2 diabetes mellitus with other circulatory complications: Secondary | ICD-10-CM

## 2020-06-22 DIAGNOSIS — E1121 Type 2 diabetes mellitus with diabetic nephropathy: Secondary | ICD-10-CM | POA: Diagnosis not present

## 2020-06-22 DIAGNOSIS — Z23 Encounter for immunization: Secondary | ICD-10-CM

## 2020-06-22 DIAGNOSIS — E1169 Type 2 diabetes mellitus with other specified complication: Secondary | ICD-10-CM | POA: Diagnosis not present

## 2020-06-22 DIAGNOSIS — I152 Hypertension secondary to endocrine disorders: Secondary | ICD-10-CM

## 2020-06-22 DIAGNOSIS — R945 Abnormal results of liver function studies: Secondary | ICD-10-CM

## 2020-06-22 DIAGNOSIS — R7989 Other specified abnormal findings of blood chemistry: Secondary | ICD-10-CM

## 2020-06-22 LAB — POCT UA - MICROALBUMIN: Microalbumin Ur, POC: 10 mg/L

## 2020-06-22 NOTE — Progress Notes (Signed)
Subjective:  Patient ID: Stacy [name], female    DOB: 02-20-1926  Age: 87 y.o. MRN: [id number]  Chief Complaint  Patient presents with  . Diabetes  . Coronary Artery Disease    HPI: chronic visit   Patient present with type 2 diabetes.  Specifically, this is type 2, non-insulin requiring diabetes, complicated by angiopathy, renal disease.  Compliance with treatment has been good; patient take medicines as directed, maintains diet and exercise regimen, follows up as directed, and is keeping glucose diary.  Date of  diagnosis 2010.  Depression screen has been performed.Tobacco screen nonsmoker. Current medicines for diabetes metformin, jaurdiancer.  Patient is on none for renal protectioncrestor and crestor for cholesterol control.  Patient performs foot exams daily and last ophthalmologic exam was yes  CORONARY ARTERY DISEASE  Patient presents in follow up of CAD. Patient was diagnosed in 30. The patient has no associated CHF. The patient is currently taking a beta blocker, statin, and aspirin. CAD was diagnosed 25 years ago.  Patient is having no angina. Patient has used no NTG.  Patient is followed by cardiology.  Patient had stents . Last angiography was 2000, last echocardiogram 6 months.. Current Outpatient Medications on File Prior to Visit  Medication Sig Dispense Refill  . ACCU-CHEK SMARTVIEW test strip 1 each by Other route as needed for other (blood sugar test strips).   11  . acetaminophen-codeine (TYLENOL #3) 300-30 MG tablet Take 2 tablets by mouth every 4 (four) hours as needed for moderate pain. 30 tablet 0  . cetirizine (ZYRTEC) 10 MG tablet TAKE 1 TABLET BY MOUTH EVERY DAY 90 tablet 2  . Cholecalciferol (VITAMIN D-3) 25 MCG (1000 UT) CAPS Take 1,000 Units by mouth 2 (two) times daily.    Marland Kitchen conjugated estrogens (PREMARIN) vaginal cream Place 1 g vaginally once a week. .625 mg once weekly    . ELIQUIS 5 MG TABS tablet TAKE 1 TABLET BY MOUTH TWICE A DAY 180 tablet 1  .  empagliflozin (JARDIANCE) 10 MG TABS tablet 1 tablet    . ipratropium (ATROVENT) 0.06 % nasal spray ipratropium bromide 42 mcg (0.06 %) nasal spray  INSTILL 2 SPRAYS EACH NOSTRIL TWICE A DAY    . Melatonin 10 MG TABS Take 10 mg by mouth as needed.    . metFORMIN (GLUCOPHAGE) 500 MG tablet Take 500 mg by mouth daily with breakfast. Taking BID    . metoprolol succinate (TOPROL-XL) 25 MG 24 hr tablet TAKE 1 TABLET BY MOUTH EVERY DAY 90 tablet 3  . mometasone (ELOCON) 0.1 % cream mometasone 0.1 % topical cream  APPLY TO AFFECTED AREA EVERY DAY AS NEEDED ITCHING    . NEOMYCIN-POLYMYXIN-HYDROCORTISONE (CORTISPORIN) 1 % SOLN otic solution Place 2 drops into both ears as directed.  3  . nitroGLYCERIN (NITROSTAT) 0.4 MG SL tablet Place 1 tablet (0.4 mg total) under the tongue every 5 (five) minutes as needed for chest pain. 25 tablet 3  . omeprazole (PRILOSEC OTC) 20 MG tablet Take 20 mg by mouth daily as needed (reflux).    . rosuvastatin (CRESTOR) 10 MG tablet TAKE 1 TABLET BY MOUTH EVERY DAY 90 tablet 3  . sertraline (ZOLOFT) 50 MG tablet TAKE 1 TABLET BY MOUTH EVERY DAY 90 tablet 2  . vitamin B-12 (CYANOCOBALAMIN) 1000 MCG tablet Take 1,000 mcg by mouth daily.     No current facility-administered medications on file prior to visit.   Past Medical History:  Diagnosis Date  . Anxiety   .  Hyperlipidemia    Past Surgical History:  Procedure Laterality Date  . 3 epidural steroid injections    . APPENDECTOMY    . Basal Cell Cancer Removal     forehead  . CATARACT EXTRACTION    . CATARACT EXTRACTION  12/29/2003  . CORONARY ANGIOPLASTY  03-15-1995  . CORONARY ANGIOPLASTY WITH STENT PLACEMENT  05-19-1998  . CORONARY STENT PLACEMENT  2000  . DILATION AND CURETTAGE OF UTERUS    . ENDARTERECTOMY Right 04/13/2015   Procedure: ENDARTERECTOMY CAROTID;  Surgeon: Serafina Mitchell, MD;  Location: [facility] OR;  Service: Vascular;  Laterality: Right;  . ENDOMETRIAL BIOPSY    . EYE SURGERY    . heart  catherization    . HEMORRHOID SURGERY    . INCISIONAL HERNIA REPAIR    . INGUINAL HERNIA REPAIR    . LOOP RECORDER INSERTION N/A 06/24/2016   Procedure: Loop Recorder Insertion;  Surgeon: Deboraha Sprang, MD;  Location: [facility];  Service: Cardiovascular;  Laterality: N/A;  . PATCH ANGIOPLASTY Right 04/13/2015   Procedure: PATCH ANGIOPLASTY;  Surgeon: Serafina Mitchell, MD;  Location: [facility] OR;  Service: Vascular;  Laterality: Right;  . PTCA    . SQUAMOUS CELL CARCINOMA EXCISION     left lower leg  . TEE WITHOUT CARDIOVERSION N/A 06/24/2016   Procedure: TRANSESOPHAGEAL ECHOCARDIOGRAM (TEE);  Surgeon: Dorothy Spark, MD;  Location: [facility] ENDOSCOPY;  Service: Cardiovascular;  Laterality: N/A;  . TONSILLECTOMY      Family History  Problem Relation Age of Onset  . Diabetes Father   . Prostate cancer Father        meta to colon  . Cancer Father        Prostate  . Parkinsonism Mother   . Stroke Paternal Uncle   . Hypertension Paternal Uncle   . Heart attack Neg Hx    Social History   Socioeconomic History  . Marital status: Widowed    Spouse name: Not on file  . Number of children: 1  . Years of education: Not on file  . Highest education level: Not on file  Occupational History  . 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 Smoking status: Former Smoker    Packs/day: 1.00    Years: 54.00    Pack years: 54.00    Types: Cigarettes    Quit date: 10/05/1998    Years since quitting: 21.7  . Smokeless tobacco: Never Used  Vaping Use  . Vaping Use: Never used  Substance and Sexual Activity  . Alcohol use: Not Currently    Alcohol/week: 0.0 standard drinks  . Drug use: Never  . Sexual activity: Not Currently  Other Topics Concern  . Not on file  Social History Narrative  . Not on file   Social Determinants of Health   Financial Resource Strain: Not on file  Food Insecurity: Not on file  Transportation Needs: Not on file   Physical Activity: Not on file  Stress: Not on file  Social Connections: Not on file    Review of Systems  Constitutional: Negative for activity change and appetite change.  HENT: Negative for congestion and sinus pain.   Eyes: Negative for visual disturbance.  Respiratory: Negative for chest tightness and shortness of breath.   Cardiovascular: Negative for chest pain, palpitations and leg swelling.  Gastrointestinal: Negative for abdominal distention and abdominal pain.  Genitourinary: Negative for difficulty urinating, dysuria and urgency.  Musculoskeletal: Positive for arthralgias. Negative for back pain.  Neurological: Negative.   Psychiatric/Behavioral: Negative.      Objective:  BP 130/72   Pulse 72   Temp 98.6 F (37 C)   Resp 16   Wt 148 lb (67.1 kg)   BMI 26.22 kg/m   BP/Weight 06/22/2020 05/05/2020 7/90/2409  Systolic BP 735 329 924  Diastolic BP 72 69 70  Wt. (Lbs) 148 148.5 151  BMI 26.22 26.31 26.75    Physical Exam Vitals reviewed.  Constitutional:      Appearance: Normal appearance.  HENT:     Head: Normocephalic.     Right Ear: Tympanic membrane, ear canal and external ear normal.     Left Ear: Tympanic membrane, ear canal and external ear normal.     Mouth/Throat:     Mouth: Mucous membranes are moist.     Pharynx: Oropharynx is clear.  Eyes:     Extraocular Movements: Extraocular movements intact.     Conjunctiva/sclera: Conjunctivae normal.     Pupils: Pupils are equal, round, and reactive to light.  Cardiovascular:     Rate and Rhythm: Normal rate and regular rhythm.     Pulses: Normal pulses.     Heart sounds: Normal heart sounds. No murmur heard. No gallop.   Pulmonary:     Effort: Pulmonary effort is normal. No respiratory distress.     Breath sounds: Normal breath sounds. No rales.  Abdominal:     General: Abdomen is flat. Bowel sounds are normal.     Palpations: Abdomen is soft.     Tenderness: There is no abdominal tenderness.   Musculoskeletal:        General: Normal range of motion.     Cervical back: Normal range of motion and neck supple.     Right lower leg: No edema.     Left lower leg: No edema.  Skin:    General: Skin is warm.     Capillary Refill: Capillary refill takes less than 2 seconds.  Neurological:     General: No focal deficit present.     Mental Status: She is alert and oriented to person, place, and time.  Psychiatric:        Mood and Affect: Mood normal.        Behavior: Behavior normal.        Thought Content: Thought content normal.        Judgment: Judgment normal.     Diabetic Foot Exam - Simple   Simple Foot Form Diabetic Foot exam was performed with the following findings: Yes 06/22/2020  2:45 PM  Visual Inspection No deformities, no ulcerations, no other skin breakdown bilaterally: Yes Sensation Testing See comments: Yes Pulse Check Posterior Tibialis and Dorsalis pulse intact bilaterally: Yes Comments Decreased sensation in feet      Lab Results  Component Value Date   WBC 9.1 06/20/2020   HGB 13.4 06/20/2020   HCT 43.6 06/20/2020   PLT 231 06/20/2020   GLUCOSE 141 (H) 06/20/2020   CHOL 107 06/20/2020   TRIG 104 06/20/2020   HDL 41 06/20/2020   LDLCALC 47 06/20/2020   ALT 38 (H) 06/20/2020   AST 85 (H) 06/20/2020   NA 139 06/20/2020   K 4.3 06/20/2020   CL 100 06/20/2020   CREATININE 1.09 (H) 06/20/2020   BUN 10 06/20/2020   CO2 23 06/20/2020   TSH 1.770 02/22/2020   INR 1.06 04/05/2015   HGBA1C 7.7 (H) 06/20/2020   MICROALBUR  10 06/22/2020      Assessment & Plan:   1. Essential hypertension An individual hypertension care plan was established and reinforced today.  The patient's status was assessed using clinical findings on exam and labs or diagnostic tests. The patient's success at meeting treatment goals on disease specific evidence-based guidelines and found to be well controlled. SELF MANAGEMENT: The patient and I together assessed ways to  personally work towards obtaining the recommended goals. RECOMMENDATIONS: avoid decongestants found in common cold remedies, decrease consumption of alcohol, perform routine monitoring of BP with home BP cuff, exercise, reduction of dietary salt, take medicines as prescribed, try not to miss doses and quit smoking.  Regular exercise and maintaining a healthy weight is needed.  Stress reduction may help. A CLINICAL SUMMARY including written plan identify barriers to care unique to individual due to social or financial issues.  We attempt to mutually creat solutions for individual and family understanding.  2. Obesity, diabetes, and hypertension syndrome ([place]) An individual care plan for diabetes was established and reinforced today.  The patient's status was assessed using clinical findings on exam, labs and diagnostic testing. Patient success at meeting goals based on disease specific evidence-based guidelines and found to be good controlled. Medications were assessed and patient's understanding of the medical issues , including barriers were assessed. Recommend adherence to a diabetic diet, a graduated exercise program, HgbA1c level is checked quarterly, and urine microalbumin performed yearly .  Annual mono-filament sensation testing performed. Lower blood pressure and control hyperlipidemia is important. Get annual eye exams and annual flu shots and smoking cessation discussed.  Self management goals were discussed.  3. Type 2 diabetes mellitus with peripheral angiopathy (HCC) - POCT UA - Microalbumin An individual care plan for diabetes was established and reinforced today.  The patient's status was assessed using clinical findings on exam, labs and diagnostic testing. Patient success at meeting goals based on disease specific evidence-based guidelines and found to be good controlled. Medications were assessed and patient's understanding of the medical issues , including barriers were  assessed. Recommend adherence to a diabetic diet, a graduated exercise program, HgbA1c level is checked quarterly, and urine microalbumin performed yearly .  Annual mono-filament sensation testing performed. Lower blood pressure and control hyperlipidemia is important. Get annual eye exams and annual flu shots and smoking cessation discussed.  Self management goals were discussed.  4. Gastroesophageal reflux disease with esophagitis without hemorrhage Plan of care was formulated today.  She is doing well.  A plan of care was formulated using patient exam, tests and other sources to optimize care using evidence based information.  Recommend no smoking, no eating after supper, avoid fatty foods, elevate Head of bed, avoid tight fitting clothing.  Continue on omeprazole.  5. Diabetic glomerulopathy [facility]) An individual care plan for diabetes was established and reinforced today.  The patient's status was assessed using clinical findings on exam, labs and diagnostic testing. Patient success at meeting goals based on disease specific evidence-based guidelines and found to be good controlled. Medications were assessed and patient's understanding of the medical issues , including barriers were assessed. Recommend adherence to a diabetic diet, a graduated exercise program, HgbA1c level is checked quarterly, and urine microalbumin performed yearly .  Annual mono-filament sensation testing performed. Lower blood pressure and control hyperlipidemia is important. Get annual eye exams and annual flu shots and smoking cessation discussed.  Self management goals were discussed.  6. Mixed conductive and sensorineural hearing loss, bilateral patient has hearing loss  and wears hearing aids  7. Hemiparesis affecting left side as late effect of cerebrovascular accident [facility]) Patient was evaluated using information from exam, tests and other diagnostic studies to perform evidence-based treatment for this disorder.   Opimizing treatment and improvement of neurologic deficits from CVA.  Patient is using cane to maintain as much independence as possible. Patient using cane.  Patient has full ability to perform ADLs.  8. Chronic kidney disease, stage 3a ([place]) AN INDIVIDUAL CARE PLAN for renal disease was established and reinforced today.  The patient's status was assessed using clinical findings on exam, labs, and other diagnostic testing. Patient's success at meeting treatment goals based on disease specific evidence-bassed guidelines and found to be in fair control. RECOMMENDATIONS include maintaining present medicines and treatment.  9. Hyperlipidemia, unspecified hyperlipidemia type AN INDIVIDUAL CARE PLAN for hyperlipidemia/ cholesterol was established and reinforced today.  The patient's status was assessed using clinical findings on exam, lab and other diagnostic tests. The patient's disease status was assessed based on evidence-based guidelines and found to be fair controlled. MEDICATIONS were reviewed. SELF MANAGEMENT GOALS have been discussed and patient's success at attaining the goal of low cholesterol was assessed. RECOMMENDATION given include regular exercise 3 days a week and low cholesterol/low fat diet. CLINICAL SUMMARY including written plan to identify barriers unique to the patient due to social or economic  reasons was discussed.  10. Need for pneumococcal vaccine - Pneumococcal polysaccharide vaccine 23-valent greater than or equal to 2yo subcutaneous/IM Patient lacks pneumovax 23      Orders Placed This Encounter  Procedures  . Pneumococcal polysaccharide vaccine 23-valent greater than or equal to 2yo subcutaneous/IM  . POCT UA - Microalbumin     Follow-up: Return in about 4 months (around 10/23/2020) for fasting.  An After Visit Summary was printed and given to the patient.  Reinaldo Meeker, MD [facility] [phone]

## 2020-06-23 ENCOUNTER — Other Ambulatory Visit: Payer: Self-pay

## 2020-06-23 DIAGNOSIS — R748 Abnormal levels of other serum enzymes: Secondary | ICD-10-CM

## 2020-06-23 NOTE — Addendum Note (Signed)
Addended by: Reinaldo Meeker on: 06/23/2020 07:41 AM   Modules accepted: Orders

## 2020-06-26 DIAGNOSIS — E042 Nontoxic multinodular goiter: Secondary | ICD-10-CM | POA: Diagnosis not present

## 2020-06-26 DIAGNOSIS — M8580 Other specified disorders of bone density and structure, unspecified site: Secondary | ICD-10-CM | POA: Diagnosis not present

## 2020-06-26 DIAGNOSIS — I2510 Atherosclerotic heart disease of native coronary artery without angina pectoris: Secondary | ICD-10-CM | POA: Diagnosis not present

## 2020-06-26 DIAGNOSIS — E538 Deficiency of other specified B group vitamins: Secondary | ICD-10-CM | POA: Diagnosis not present

## 2020-06-26 DIAGNOSIS — E1151 Type 2 diabetes mellitus with diabetic peripheral angiopathy without gangrene: Secondary | ICD-10-CM | POA: Diagnosis not present

## 2020-06-28 ENCOUNTER — Encounter: Payer: Self-pay | Admitting: Legal Medicine

## 2020-06-28 ENCOUNTER — Telehealth: Payer: Medicare Other

## 2020-07-10 ENCOUNTER — Emergency Department (HOSPITAL_COMMUNITY)
Admission: EM | Admit: 2020-07-10 | Discharge: 2020-07-10 | Disposition: A | Discharge status: Home / Self Care | Payer: Medicare Other | Attending: Emergency Medicine | Admitting: Emergency Medicine

## 2020-07-10 ENCOUNTER — Emergency Department (HOSPITAL_COMMUNITY): Payer: Medicare Other

## 2020-07-10 DIAGNOSIS — E1169 Type 2 diabetes mellitus with other specified complication: Secondary | ICD-10-CM | POA: Diagnosis not present

## 2020-07-10 DIAGNOSIS — Z85828 Personal history of other malignant neoplasm of skin: Secondary | ICD-10-CM | POA: Diagnosis not present

## 2020-07-10 DIAGNOSIS — Z7901 Long term (current) use of anticoagulants: Secondary | ICD-10-CM | POA: Diagnosis not present

## 2020-07-10 DIAGNOSIS — Z955 Presence of coronary angioplasty implant and graft: Secondary | ICD-10-CM | POA: Insufficient documentation

## 2020-07-10 DIAGNOSIS — E1122 Type 2 diabetes mellitus with diabetic chronic kidney disease: Secondary | ICD-10-CM | POA: Insufficient documentation

## 2020-07-10 DIAGNOSIS — K769 Liver disease, unspecified: Secondary | ICD-10-CM

## 2020-07-10 DIAGNOSIS — K753 Granulomatous hepatitis, not elsewhere classified: Secondary | ICD-10-CM | POA: Diagnosis not present

## 2020-07-10 DIAGNOSIS — N183 Chronic kidney disease, stage 3 unspecified: Secondary | ICD-10-CM | POA: Diagnosis not present

## 2020-07-10 DIAGNOSIS — W19XXXA Unspecified fall, initial encounter: Secondary | ICD-10-CM | POA: Insufficient documentation

## 2020-07-10 DIAGNOSIS — E785 Hyperlipidemia, unspecified: Secondary | ICD-10-CM | POA: Diagnosis not present

## 2020-07-10 DIAGNOSIS — Z7984 Long term (current) use of oral hypoglycemic drugs: Secondary | ICD-10-CM | POA: Diagnosis not present

## 2020-07-10 DIAGNOSIS — Z79899 Other long term (current) drug therapy: Secondary | ICD-10-CM | POA: Diagnosis not present

## 2020-07-10 DIAGNOSIS — S76211A Strain of adductor muscle, fascia and tendon of right thigh, initial encounter: Secondary | ICD-10-CM

## 2020-07-10 DIAGNOSIS — R112 Nausea with vomiting, unspecified: Secondary | ICD-10-CM | POA: Diagnosis not present

## 2020-07-10 DIAGNOSIS — I10 Essential (primary) hypertension: Secondary | ICD-10-CM | POA: Diagnosis not present

## 2020-07-10 DIAGNOSIS — S3991XA Unspecified injury of abdomen, initial encounter: Secondary | ICD-10-CM | POA: Diagnosis present

## 2020-07-10 DIAGNOSIS — E1121 Type 2 diabetes mellitus with diabetic nephropathy: Secondary | ICD-10-CM | POA: Insufficient documentation

## 2020-07-10 DIAGNOSIS — E1151 Type 2 diabetes mellitus with diabetic peripheral angiopathy without gangrene: Secondary | ICD-10-CM | POA: Insufficient documentation

## 2020-07-10 DIAGNOSIS — K429 Umbilical hernia without obstruction or gangrene: Secondary | ICD-10-CM | POA: Diagnosis not present

## 2020-07-10 DIAGNOSIS — S39011A Strain of muscle, fascia and tendon of abdomen, initial encounter: Secondary | ICD-10-CM | POA: Diagnosis not present

## 2020-07-10 DIAGNOSIS — Z87891 Personal history of nicotine dependence: Secondary | ICD-10-CM | POA: Insufficient documentation

## 2020-07-10 DIAGNOSIS — K8020 Calculus of gallbladder without cholecystitis without obstruction: Secondary | ICD-10-CM | POA: Insufficient documentation

## 2020-07-10 DIAGNOSIS — I129 Hypertensive chronic kidney disease with stage 1 through stage 4 chronic kidney disease, or unspecified chronic kidney disease: Secondary | ICD-10-CM | POA: Insufficient documentation

## 2020-07-10 DIAGNOSIS — K7689 Other specified diseases of liver: Secondary | ICD-10-CM | POA: Diagnosis not present

## 2020-07-10 DIAGNOSIS — E1136 Type 2 diabetes mellitus with diabetic cataract: Secondary | ICD-10-CM | POA: Diagnosis not present

## 2020-07-10 DIAGNOSIS — R109 Unspecified abdominal pain: Secondary | ICD-10-CM

## 2020-07-10 DIAGNOSIS — R1031 Right lower quadrant pain: Secondary | ICD-10-CM | POA: Diagnosis not present

## 2020-07-10 LAB — COMPREHENSIVE METABOLIC PANEL
ALT: 41 U/L (ref 0–44)
AST: 94 U/L — ABNORMAL HIGH (ref 15–41)
Albumin: 3.1 g/dL — ABNORMAL LOW (ref 3.5–5.0)
Alkaline Phosphatase: 207 U/L — ABNORMAL HIGH (ref 38–126)
Anion gap: 11 (ref 5–15)
BUN: 15 mg/dL (ref 8–23)
CO2: 24 mmol/L (ref 22–32)
Calcium: 9.3 mg/dL (ref 8.9–10.3)
Chloride: 102 mmol/L (ref 98–111)
Creatinine, Ser: 1.33 mg/dL — ABNORMAL HIGH (ref 0.44–1.00)
GFR, Estimated: 38 mL/min — ABNORMAL LOW (ref >60)
Glucose, Bld: 155 mg/dL — ABNORMAL HIGH (ref 70–99)
Potassium: 4.2 mmol/L (ref 3.5–5.1)
Sodium: 137 mmol/L (ref 135–145)
Total Bilirubin: 1 mg/dL (ref 0.3–1.2)
Total Protein: 6.7 g/dL (ref 6.5–8.1)

## 2020-07-10 LAB — CBC WITH DIFFERENTIAL/PLATELET
Abs Immature Granulocytes: 0.03 10*3/uL (ref 0.00–0.07)
Basophils Absolute: 0.1 10*3/uL (ref 0.0–0.1)
Basophils Relative: 1 %
Eosinophils Absolute: 0.1 10*3/uL (ref 0.0–0.5)
Eosinophils Relative: 1 %
HCT: 44.8 % (ref 36.0–46.0)
Hemoglobin: 13.7 g/dL (ref 12.0–15.0)
Immature Granulocytes: 0 %
Lymphocytes Relative: 24 %
Lymphs Abs: 2.2 10*3/uL (ref 0.7–4.0)
MCH: 26.9 pg (ref 26.0–34.0)
MCHC: 30.6 g/dL (ref 30.0–36.0)
MCV: 88 fL (ref 80.0–100.0)
Monocytes Absolute: 0.8 10*3/uL (ref 0.1–1.0)
Monocytes Relative: 9 %
Neutro Abs: 6 10*3/uL (ref 1.7–7.7)
Neutrophils Relative %: 65 %
Platelets: 260 10*3/uL (ref 150–400)
RBC: 5.09 MIL/uL (ref 3.87–5.11)
RDW: 14.9 % (ref 11.5–15.5)
WBC: 9.1 10*3/uL (ref 4.0–10.5)
nRBC: 0 % (ref 0.0–0.2)

## 2020-07-10 LAB — URINALYSIS, ROUTINE W REFLEX MICROSCOPIC
Bacteria, UA: NONE SEEN
Bilirubin Urine: NEGATIVE
Glucose, UA: >=500 mg/dL — AB
Hgb urine dipstick: NEGATIVE
Ketones, ur: NEGATIVE mg/dL
Leukocytes,Ua: NEGATIVE
Nitrite: NEGATIVE
Protein, ur: NEGATIVE mg/dL
Specific Gravity, Urine: 1.022 (ref 1.005–1.030)
pH: 6 (ref 5.0–8.0)

## 2020-07-10 LAB — LIPASE, BLOOD: Lipase: 23 U/L (ref 11–51)

## 2020-07-10 MED ORDER — SODIUM CHLORIDE 0.9 % IV BOLUS
500.0000 mL | Freq: Once | INTRAVENOUS | Status: AC
  Administered 2020-07-10: 500 mL via INTRAVENOUS

## 2020-07-10 MED ORDER — ONDANSETRON 4 MG PO TBDP: 4.0000 mg | ORAL_TABLET | Freq: Three times a day (TID) | ORAL | 0 refills | Status: DC | PRN

## 2020-07-10 NOTE — ED Notes (Signed)
Patient given discharge paperwork and instructions. Verbalized understanding of teaching. IV d/c with cath tip intact. Wheeled to exit in NAD.

## 2020-07-10 NOTE — ED Provider Notes (Signed)
[facility] EMERGENCY DEPARTMENT Provider Note   CSN: [id number] Arrival date & time: 07/10/20  1223     History Chief Complaint  Patient presents with  . Abdominal Pain    Stacy [name] is a 87 y.o. female.  HPI 87 year old female presents with abdominal pain.  Its in her right lower abdomen.  Started about a week ago.  She fell the day before though landed on her buttocks and did not seem to suffer any injuries.  Now she is having pain in her right lower abdomen when she walks.  No hip pain.  No chest pain or shortness of breath.  Is feeling nauseated and vomited once 2 days ago.  She has had very minimal output despite Colace twice per day.  Pain is most prominent when she gets up and walks and it is a 10 out of 10.  At rest is pretty mild.  She had a ruptured appendix 40 years ago and this feels similar.  Past Medical History:  Diagnosis Date  . Anxiety   . Hyperlipidemia     Patient Active Problem List   Diagnosis Date Noted  . Multinodular goiter 02/24/2020  . Renal artery stenosis ([place]) 02/24/2020  . Type 2 diabetes mellitus with peripheral angiopathy ([place]) 02/24/2020  . Closed fracture of base of first metacarpal bone of left hand 07/01/2019  . Fall due to tripping on loose carpet 06/17/2019  . Fracture of phalanx of left thumb 06/17/2019  . Contusion, nose 06/17/2019  . Open wound of right lower leg 06/17/2019  . Obesity, diabetes, and hypertension syndrome ([place]) 04/20/2019  . Mixed conductive and sensorineural hearing loss, bilateral 04/20/2019  . Hemiparesis affecting left side as late effect of cerebrovascular accident ([place]) 04/20/2019  . Bilateral nonexudative age-related macular degeneration 04/20/2019  . Diabetic glomerulopathy ([place]) 04/20/2019  . Chronic kidney disease, stage 3a ([place]) 04/20/2019  . Secondary hypercoagulable state ([place]) 01/01/2019  . Paroxysmal atrial fibrillation ([place]) 08/05/2016  . Acute ischemic stroke ([place]) 06/23/2016  .  CKD (chronic kidney disease), stage II 06/23/2016  . Carotid artery stenosis 04/13/2015  . Insomnia 04/25/2012  . Pain in limb 12/09/2011  . Hyperlipidemia 06/18/2008  . RENAL ARTERY STENOSIS 06/18/2008  . GASTROESOPHAGEAL REFLUX DISEASE 06/18/2008  . PEPTIC ULCER DISEASE, HX OF 06/18/2008  . CATARACT EXTRACTIONS, BILATERAL, HX OF 06/18/2008  . Other acquired absence of organ 06/18/2008  . PERCUTANEOUS TRANSLUMINAL CORONARY ANGIOPLASTY, HX OF 06/18/2008  . Other postprocedural status(V45.89) 06/18/2008  . Essential hypertension 05/16/2007  . Coronary atherosclerosis 05/16/2007  . ARTHRITIS 05/16/2007  . OSTEOPENIA 05/16/2007  . HEMORRHOIDS 11/18/2006    Past Surgical History:  Procedure Laterality Date  . 3 epidural steroid injections    . APPENDECTOMY    . Basal Cell Cancer Removal     forehead  . CATARACT EXTRACTION    . CATARACT EXTRACTION  12/29/2003  . CORONARY ANGIOPLASTY  03-15-1995  . CORONARY ANGIOPLASTY WITH STENT PLACEMENT  05-19-1998  . CORONARY STENT PLACEMENT  2000  . DILATION AND CURETTAGE OF UTERUS    . ENDARTERECTOMY Right 04/13/2015   Procedure: ENDARTERECTOMY CAROTID;  Surgeon: Serafina Mitchell, MD;  Location: [facility] OR;  Service: Vascular;  Laterality: Right;  . ENDOMETRIAL BIOPSY    . EYE SURGERY    . heart catherization    . HEMORRHOID SURGERY    . INCISIONAL HERNIA REPAIR    . INGUINAL HERNIA REPAIR    . LOOP RECORDER INSERTION N/A 06/24/2016   Procedure: Loop  Recorder Insertion;  Surgeon: Deboraha Sprang, MD;  Location: [facility] CV LAB;  Service: Cardiovascular;  Laterality: N/A;  . PATCH ANGIOPLASTY Right 04/13/2015   Procedure: PATCH ANGIOPLASTY;  Surgeon: Serafina Mitchell, MD;  Location: [facility] OR;  Service: Vascular;  Laterality: Right;  . PTCA    . SQUAMOUS CELL CARCINOMA EXCISION     left lower leg  . TEE WITHOUT CARDIOVERSION N/A 06/24/2016   Procedure: TRANSESOPHAGEAL ECHOCARDIOGRAM (TEE);  Surgeon: Dorothy Spark, MD;  Location: [facility] ENDOSCOPY;   Service: Cardiovascular;  Laterality: N/A;  . TONSILLECTOMY       OB History   No obstetric history on file.     Family History  Problem Relation Age of Onset  . Diabetes Father   . Prostate cancer Father        meta to colon  . Cancer Father        Prostate  . Parkinsonism Mother   . Stroke Paternal Uncle   . Hypertension Paternal Uncle   . Heart attack Neg Hx     Social History   Tobacco Use  . Smoking status: Former Smoker    Packs/day: 1.00    Years: 54.00    Pack years: 54.00    Types: Cigarettes    Quit date: 10/05/1998    Years since quitting: 21.7  . Smokeless tobacco: Never Used  Vaping Use  . Vaping Use: Never used  Substance Use Topics  . Alcohol use: Not Currently    Alcohol/week: 0.0 standard drinks  . Drug use: Never    Home Medications Prior to Admission medications   Medication Sig Start Date End Date Taking? Authorizing Provider  ondansetron (ZOFRAN ODT) 4 MG disintegrating tablet Take 1 tablet (4 mg total) by mouth every 8 (eight) hours as needed for nausea or vomiting. 07/10/20  Yes Sherwood Gambler, MD  ACCU-CHEK SMARTVIEW test strip 1 each by Other route as needed for other (blood sugar test strips).  03/29/14   [provider]  acetaminophen-codeine (TYLENOL #3) 300-30 MG tablet Take 2 tablets by mouth every 4 (four) hours as needed for moderate pain. 02/24/20   Lillard Anes, MD  cetirizine (ZYRTEC) 10 MG tablet TAKE 1 TABLET BY MOUTH EVERY DAY 01/19/20   Lillard Anes, MD  Cholecalciferol (VITAMIN D-3) 25 MCG (1000 UT) CAPS Take 1,000 Units by mouth 2 (two) times daily.    [provider]  conjugated estrogens (PREMARIN) vaginal cream Place 1 g vaginally once a week. .625 mg once weekly    [provider]  ELIQUIS 5 MG TABS tablet TAKE 1 TABLET BY MOUTH TWICE A DAY 09/14/19   Sherren Mocha, MD  empagliflozin (JARDIANCE) 10 MG TABS tablet 1 tablet 03/23/20   [provider]  ipratropium  (ATROVENT) 0.06 % nasal spray ipratropium bromide 42 mcg (0.06 %) nasal spray  INSTILL 2 SPRAYS EACH NOSTRIL TWICE A DAY    [provider]  Melatonin 10 MG TABS Take 10 mg by mouth as needed.    [provider]  metFORMIN (GLUCOPHAGE) 500 MG tablet Take 500 mg by mouth daily with breakfast. Taking BID    [provider]  metoprolol succinate (TOPROL-XL) 25 MG 24 hr tablet TAKE 1 TABLET BY MOUTH EVERY DAY 09/21/19   Fenton, Clint R, PA  mometasone (ELOCON) 0.1 % cream mometasone 0.1 % topical cream  APPLY TO AFFECTED AREA EVERY DAY AS NEEDED ITCHING    [provider]  NEOMYCIN-POLYMYXIN-HYDROCORTISONE (CORTISPORIN) 1 %  SOLN otic solution Place 2 drops into both ears as directed. 01/27/14   [provider]  nitroGLYCERIN (NITROSTAT) 0.4 MG SL tablet Place 1 tablet (0.4 mg total) under the tongue every 5 (five) minutes as needed for chest pain. 08/19/18   Sherren Mocha, MD  omeprazole (PRILOSEC OTC) 20 MG tablet Take 20 mg by mouth daily as needed (reflux).    [provider]  rosuvastatin (CRESTOR) 10 MG tablet TAKE 1 TABLET BY MOUTH EVERY DAY 08/18/19   Sherren Mocha, MD  sertraline (ZOLOFT) 50 MG tablet TAKE 1 TABLET BY MOUTH EVERY DAY 03/17/20   Lillard Anes, MD  vitamin B-12 (CYANOCOBALAMIN) 1000 MCG tablet Take 1,000 mcg by mouth daily.    [provider]    Allergies    Iodinated diagnostic agents, Iohexol, Sulfonamide derivatives, Latex, Tranilast, Metformin hcl, Other, Tape, Wound dressing adhesive, and Sulfa antibiotics  Review of Systems   Review of Systems  Constitutional: Negative for fever.  Cardiovascular: Negative for chest pain.  Gastrointestinal: Positive for abdominal pain, constipation, nausea and vomiting.  Musculoskeletal: Negative for arthralgias and back pain.  All other systems reviewed and are negative.   Physical Exam Updated Vital Signs BP (!) 151/68   Pulse 67   Temp 98.2 F (36.8 C)  (Oral)   Resp (!) 21   SpO2 95%   Physical Exam Vitals and nursing note reviewed. Exam conducted with a chaperone present.  Constitutional:      Appearance: She is well-developed.  HENT:     Head: Normocephalic and atraumatic.     Right Ear: External ear normal.     Left Ear: External ear normal.     Nose: Nose normal.  Eyes:     General:        Right eye: No discharge.        Left eye: No discharge.  Cardiovascular:     Rate and Rhythm: Normal rate and regular rhythm.     Heart sounds: Normal heart sounds.  Pulmonary:     Effort: Pulmonary effort is normal.     Breath sounds: Normal breath sounds.  Abdominal:     Palpations: Abdomen is soft.     Tenderness: There is abdominal tenderness (mild) in the right lower quadrant.  Genitourinary:    Comments: No fecal impaction, hardly any stool. Normal color, no melena or blood Skin:    General: Skin is warm and dry.  Neurological:     Mental Status: She is alert.  Psychiatric:        Mood and Affect: Mood is not anxious.     ED Results / Procedures / Treatments   Labs (all labs ordered are listed, but only abnormal results are displayed) Labs Reviewed  COMPREHENSIVE METABOLIC PANEL - Abnormal; Notable for the following components:      Result Value   Glucose, Bld 155 (*)    Creatinine, Ser 1.33 (*)    Albumin 3.1 (*)    AST 94 (*)    Alkaline Phosphatase 207 (*)    GFR, Estimated 38 (*)    All other components within normal limits  URINALYSIS, ROUTINE W REFLEX MICROSCOPIC - Abnormal; Notable for the following components:   Glucose, UA >=500 (*)    All other components within normal limits  LIPASE, BLOOD  CBC WITH DIFFERENTIAL/PLATELET    EKG EKG Interpretation  Date/Time:  Monday Jul 10 2020 13:41:39 EDT Ventricular Rate:  75 PR Interval:  148 QRS Duration: 129 QT Interval:  429 QTC Calculation: 480 R Axis:   -21 Text Interpretation: Sinus arrhythmia Right bundle branch block Anterolateral infarct, age  indeterminate similar to Mar 2021 Confirmed by Sherwood Gambler [phone]) on 07/10/2020 2:21:21 PM   Radiology CT ABDOMEN PELVIS WO CONTRAST  Result Date: 07/10/2020 CLINICAL DATA:  87 year old female with history of nausea and vomiting. Suspected bowel obstruction. EXAM: CT ABDOMEN AND PELVIS WITHOUT CONTRAST TECHNIQUE: Multidetector CT imaging of the abdomen and pelvis was performed following the standard protocol without IV contrast. COMPARISON:  CT the abdomen and pelvis 11/26/2005. FINDINGS: Lower chest: Mild fibrotic changes in the visualize lung bases. Atherosclerotic calcifications in the descending thoracic aorta as well as the right coronary artery. Hepatobiliary: Low-attenuation lesions in the liver, incompletely characterized on today's non-contrast CT examination, but statistically likely to represent small cysts. No definite suspicious hepatic lesions are confidently identified on today's noncontrast CT examination. Calcified granulomas are noted in the liver. Calcified gallstones lying dependently in the gallbladder measuring up to 11 mm. Pancreas: No definite pancreatic mass or peripancreatic fluid collections or inflammatory changes are noted on today's noncontrast CT examination. Spleen: Unremarkable. Adrenals/Urinary Tract: Unenhanced appearance of the kidneys and bilateral adrenal glands are normal. No hydroureteronephrosis. Urinary bladder is normal in appearance. Stomach/Bowel: Unenhanced appearance of the stomach is normal. No pathologic dilatation of small bowel or colon. The appendix is not confidently identified and may be surgically absent. Regardless, there are no inflammatory changes noted adjacent to the cecum to suggest the presence of an acute appendicitis at this time. Vascular/Lymphatic: Aortic atherosclerosis. No lymphadenopathy noted in the abdomen or pelvis. Reproductive: Unenhanced appearance of uterus and ovaries is unremarkable. Other: Small umbilical hernia containing only  omental fat. No significant volume of ascites. No pneumoperitoneum. Musculoskeletal: There are no aggressive appearing lytic or blastic lesions noted in the visualized portions of the skeleton. IMPRESSION: 1. No acute findings are noted in the abdomen or pelvis to account for the patient's symptoms. 2. Small umbilical hernia containing only omental fat. No associated bowel incarceration or obstruction at this time. 3. Cholelithiasis without evidence of acute cholecystitis at this time. 4. Aortic atherosclerosis, as well as right coronary artery disease. 5. Additional incidental findings, as above. Electronically Signed   By: Vinnie Langton M.D.   On: 07/10/2020 13:42   US Abdomen Limited RUQ (LIVER/GB)  Result Date: 07/10/2020 CLINICAL DATA:  Pain EXAM: ULTRASOUND ABDOMEN LIMITED RIGHT UPPER QUADRANT COMPARISON:  None. FINDINGS: Gallbladder: There are multiple gallstones noted. Normal gallbladder wall thickness. No pericholecystic cystic fluid. No sonographic Murphy sign noted by sonographer. Common bile duct: Diameter: 2 mm, normal Liver: There are multiple incompletely assessed masses in the liver. There is a hypoechoic solid mass in the LEFT liver which measures 2.9 by 2.0 x 2.9 cm. There is a hypoechoic mass along the margin of the RIGHT liver which measures 2.9 x 2.9 x 2.3 cm. There is a simple cyst near the porta hepatic which measures 18 x 13 x 17 mm. Portal vein is patent on color Doppler imaging with normal direction of blood flow towards the liver. Other: None. IMPRESSION: 1. Cholelithiasis without evidence of cholecystitis. 2. There are innumerable incompletely assessed hypoechoic masses in the liver. Recommend further evaluation with a dedicated liver MRI with and without contrast. Electronically Signed   By: Valentino Saxon MD   On: 07/10/2020 14:48    Procedures Procedures   Medications Ordered in ED Medications  sodium chloride 0.9 % bolus 500 mL (0 mLs Intravenous Stopped 07/10/20  1400)  ED Course  I have reviewed the triage vital signs and the nursing notes.  Pertinent labs & imaging results that were available during my care of the patient were reviewed by me and considered in my medical decision making (see chart for details).    MDM Rules/Calculators/A&P                          Patient has minimal to no tenderness on my exam and it is on the lower abdomen.  She has some trouble urinating but her urine is negative.  She had some abnormal LFTs earlier so an ultrasound was obtained and does show a gallstone and some liver lesions that are unclear.  I have discussed this with her and her daughter and she will need outpatient MRI.  CT does not show any obvious pathology such as obstruction.  At this point, her pain might be more from a groin strain given its worse with certain movements like bending and walking.  She has been ambulatory without difficulty and her hips look fine on the CT.  Given this I think she is stable for outpatient management.  She has had some mild nausea and vomiting and so we will give Zofran.  However my suspicion that she has cholecystitis is pretty low.  Will refer to outpatient general surgery. Final Clinical Impression(s) / ED Diagnoses Final diagnoses:  Right sided abdominal pain  Calculus of gallbladder without cholecystitis without obstruction  Liver lesion  Strain of right groin    Rx / DC Orders ED Discharge Orders         Ordered    ondansetron (ZOFRAN ODT) 4 MG disintegrating tablet  Every 8 hours PRN        07/10/20 1528           Sherwood Gambler, MD 07/10/20 1542

## 2020-07-10 NOTE — Discharge Instructions (Addendum)
If you develop worsening, continued, or recurrent abdominal pain, uncontrolled vomiting, fever, chest or back pain, or any other new/concerning symptoms then return to the ER for evaluation.  

## 2020-07-10 NOTE — ED Triage Notes (Signed)
Pt arrived from home c/o abdominal pain. Pain is worse right lower abdomen.  Pt has not had a BM in over a week.   Pt also had a mechanical fall last week. No LOC and no head trauma   Pt is on elaquis for a.fib  Hx of MI and stroke

## 2020-07-12 ENCOUNTER — Encounter: Payer: Self-pay | Admitting: Legal Medicine

## 2020-07-12 ENCOUNTER — Ambulatory Visit (INDEPENDENT_AMBULATORY_CARE_PROVIDER_SITE_OTHER): Payer: Medicare Other | Admitting: Legal Medicine

## 2020-07-12 ENCOUNTER — Other Ambulatory Visit: Payer: Self-pay

## 2020-07-12 VITALS — BP 120/60 | HR 74 | Temp 97.5°F | Resp 16 | Ht 63.0 in | Wt 141.0 lb

## 2020-07-12 DIAGNOSIS — R110 Nausea: Secondary | ICD-10-CM

## 2020-07-12 DIAGNOSIS — R102 Pelvic and perineal pain: Secondary | ICD-10-CM | POA: Diagnosis not present

## 2020-07-12 DIAGNOSIS — R748 Abnormal levels of other serum enzymes: Secondary | ICD-10-CM

## 2020-07-12 DIAGNOSIS — C787 Secondary malignant neoplasm of liver and intrahepatic bile duct: Secondary | ICD-10-CM | POA: Diagnosis not present

## 2020-07-12 MED ORDER — ONDANSETRON 4 MG PO TBDP: 4.0000 mg | ORAL_TABLET | Freq: Three times a day (TID) | ORAL | 3 refills | Status: DC | PRN

## 2020-07-12 NOTE — Addendum Note (Signed)
Addended by: Thompson Caul I on: 07/12/2020 04:37 PM   Modules accepted: Orders

## 2020-07-12 NOTE — Progress Notes (Signed)
Subjective:  Patient ID: Stacy [name], female    DOB: 05/05/1926  Age: 87 y.o. MRN: [id number]  Chief Complaint  Patient presents with  . Follow-up    May 22th, patient fell down and hit the cement.   . Abdominal Pain    Patient is sore in the groin area. It is worse in the right side than the left side. Her pain is worse when she walks or standin up.  . Constipation    HPI: patient was in er 04/04/2020 and fell, she had RLQ abdominal pain.  She ws found to have galls stones but no edema.  She was found to have 3 small areas in liver.  She has been losing weight an not eating much. She needs MRI.   Current Outpatient Medications on File Prior to Visit  Medication Sig Dispense Refill  . ACCU-CHEK SMARTVIEW test strip 1 each by Other route as needed for other (blood sugar test strips).   11  . acetaminophen-codeine (TYLENOL #3) 300-30 MG tablet Take 2 tablets by mouth every 4 (four) hours as needed for moderate pain. 30 tablet 0  . cetirizine (ZYRTEC) 10 MG tablet TAKE 1 TABLET BY MOUTH EVERY DAY 90 tablet 2  . Cholecalciferol (VITAMIN D-3) 25 MCG (1000 UT) CAPS Take 1,000 Units by mouth 2 (two) times daily.    Marland Kitchen conjugated estrogens (PREMARIN) vaginal cream Place 1 g vaginally once a week. .625 mg once weekly    . ELIQUIS 5 MG TABS tablet TAKE 1 TABLET BY MOUTH TWICE A DAY 180 tablet 1  . empagliflozin (JARDIANCE) 10 MG TABS tablet 1 tablet    . ipratropium (ATROVENT) 0.06 % nasal spray ipratropium bromide 42 mcg (0.06 %) nasal spray  INSTILL 2 SPRAYS EACH NOSTRIL TWICE A DAY    . Melatonin 10 MG TABS Take 10 mg by mouth as needed.    . metFORMIN (GLUCOPHAGE) 500 MG tablet Take 500 mg by mouth daily with breakfast. Taking BID    . metoprolol succinate (TOPROL-XL) 25 MG 24 hr tablet TAKE 1 TABLET BY MOUTH EVERY DAY 90 tablet 3  . mometasone (ELOCON) 0.1 % cream mometasone 0.1 % topical cream  APPLY TO AFFECTED AREA EVERY DAY AS NEEDED ITCHING    . NEOMYCIN-POLYMYXIN-HYDROCORTISONE  (CORTISPORIN) 1 % SOLN otic solution Place 2 drops into both ears as directed.  3  . nitroGLYCERIN (NITROSTAT) 0.4 MG SL tablet Place 1 tablet (0.4 mg total) under the tongue every 5 (five) minutes as needed for chest pain. 25 tablet 3  . omeprazole (PRILOSEC OTC) 20 MG tablet Take 20 mg by mouth daily as needed (reflux).    . rosuvastatin (CRESTOR) 10 MG tablet TAKE 1 TABLET BY MOUTH EVERY DAY 90 tablet 3  . sertraline (ZOLOFT) 50 MG tablet TAKE 1 TABLET BY MOUTH EVERY DAY 90 tablet 2  . vitamin B-12 (CYANOCOBALAMIN) 1000 MCG tablet Take 1,000 mcg by mouth daily.     No current facility-administered medications on file prior to visit.   Past Medical History:  Diagnosis Date  . Anxiety   . Hyperlipidemia    Past Surgical History:  Procedure Laterality Date  . 3 epidural steroid injections    . APPENDECTOMY    . Basal Cell Cancer Removal     forehead  . CATARACT EXTRACTION    . CATARACT EXTRACTION  12/29/2003  . CORONARY ANGIOPLASTY  03-15-1995  . CORONARY ANGIOPLASTY WITH STENT PLACEMENT  05-19-1998  . CORONARY STENT PLACEMENT  2000  .  DILATION AND CURETTAGE OF UTERUS    . ENDARTERECTOMY Right 04/13/2015   Procedure: ENDARTERECTOMY CAROTID;  Surgeon: Serafina Mitchell, MD;  Location: [facility] OR;  Service: Vascular;  Laterality: Right;  . ENDOMETRIAL BIOPSY    . EYE SURGERY    . heart catherization    . HEMORRHOID SURGERY    . INCISIONAL HERNIA REPAIR    . INGUINAL HERNIA REPAIR    . LOOP RECORDER INSERTION N/A 06/24/2016   Procedure: Loop Recorder Insertion;  Surgeon: Deboraha Sprang, MD;  Location: [place];  Service: Cardiovascular;  Laterality: N/A;  . PATCH ANGIOPLASTY Right 04/13/2015   Procedure: PATCH ANGIOPLASTY;  Surgeon: Serafina Mitchell, MD;  Location[facility] OR;  Service: Vascular;  Laterality: Right;  . PTCA    . SQUAMOUS CELL CARCINOMA EXCISION     left lower leg  . TEE WITHOUT CARDIOVERSION N/A 06/24/2016   Procedure: TRANSESOPHAGEAL ECHOCARDIOGRAM (TEE);  Surgeon:  Dorothy Spark, MD;  Location: [facility] ENDOSCOPY;  Service: Cardiovascular;  Laterality: N/A;  . TONSILLECTOMY      Family History  Problem Relation Age of Onset  . Diabetes Father   . Prostate cancer Father        meta to colon  . Cancer Father        Prostate  . Parkinsonism Mother   . Stroke Paternal Uncle   . Hypertension Paternal Uncle   . Heart attack Neg Hx    Social History   Socioeconomic History  . Marital status: Widowed    Spouse name: Not on file  . Number of children: 1  . Years of education: Not on file  . Highest education level: Not on file  Occupational History  . 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 Smoking status: Former Smoker    Packs/day: 1.00    Years: 54.00    Pack years: 54.00    Types: Cigarettes    Quit date: 10/05/1998    Years since quitting: 21.7  . Smokeless tobacco: Never Used  Vaping Use  . Vaping Use: Never used  Substance and Sexual Activity  . Alcohol use: Not Currently    Alcohol/week: 0.0 standard drinks  . Drug use: Never  . Sexual activity: Not Currently  Other Topics Concern  . Not on file  Social History Narrative  . Not on file   Social Determinants of Health   Financial Resource Strain: Not on file  Food Insecurity: Not on file  Transportation Needs: Not on file  Physical Activity: Not on file  Stress: Not on file  Social Connections: Not on file    Review of Systems  Constitutional: Positive for unexpected weight change. Negative for activity change and appetite change.  HENT: Negative for congestion and sinus pain.   Eyes: Negative for visual disturbance.  Respiratory: Negative for chest tightness and shortness of breath.   Cardiovascular: Negative for chest pain, palpitations and leg swelling.  Gastrointestinal: Positive for abdominal pain.  Endocrine: Negative for polyuria.  Genitourinary: Negative for difficulty urinating, dysuria and urgency.   Musculoskeletal: Negative for arthralgias and back pain.  Neurological: Negative.   Psychiatric/Behavioral: Negative.      Objective:  BP 120/60   Pulse 74   Temp (!) 97.5 F (36.4 C)   Resp 16   Ht 5\' 3"  (1.6 m)   Wt 141 lb (64 kg)   BMI 24.98 kg/m   BP/Weight 07/12/2020 07/10/2020 06/22/2020  Systolic BP 583 094 076  Diastolic BP 60 68 72  Wt. (Lbs) 141 - 148  BMI 24.98 - 26.22    Physical Exam Vitals reviewed.  Constitutional:      Appearance: She is well-developed.  HENT:     Right Ear: Tympanic membrane normal.     Left Ear: Tympanic membrane normal.     Mouth/Throat:     Mouth: Mucous membranes are moist.     Pharynx: Oropharynx is clear.  Eyes:     Extraocular Movements: Extraocular movements intact.     Conjunctiva/sclera: Conjunctivae normal.     Pupils: Pupils are equal, round, and reactive to light.  Cardiovascular:     Rate and Rhythm: Normal rate and regular rhythm.     Pulses: Normal pulses.     Heart sounds: Normal heart sounds. No murmur heard. No gallop.   Pulmonary:     Effort: Pulmonary effort is normal. No respiratory distress.     Breath sounds: Normal breath sounds. No rales.  Abdominal:     General: Abdomen is flat and scaphoid.     Palpations: Abdomen is rigid.     Tenderness: There is abdominal tenderness (RLQ area).  Musculoskeletal:        General: Normal range of motion.     Cervical back: Normal range of motion and neck supple.  Skin:    General: Skin is warm and dry.     Capillary Refill: Capillary refill takes less than 2 seconds.  Neurological:     General: No focal deficit present.     Mental Status: She is alert and oriented to person, place, and time.       Lab Results  Component Value Date   WBC 9.1 07/10/2020   HGB 13.7 07/10/2020   HCT 44.8 07/10/2020   PLT 260 07/10/2020   GLUCOSE 155 (H) 07/10/2020   CHOL 107 06/20/2020   TRIG 104 06/20/2020   HDL 41 06/20/2020   LDLCALC 47 06/20/2020   ALT 41 07/10/2020    AST 94 (H) 07/10/2020   NA 137 07/10/2020   K 4.2 07/10/2020   CL 102 07/10/2020   CREATININE 1.33 (H) 07/10/2020   BUN 15 07/10/2020   CO2 24 07/10/2020   TSH 1.770 02/22/2020   INR 1.06 04/05/2015   HGBA1C 7.7 (H) 06/20/2020   MICROALBUR 10 06/22/2020      Assessment & Plan:   Diagnoses and all orders for this visit: Nausea -     ondansetron (ZOFRAN ODT) 4 MG disintegrating tablet; Take 1 tablet (4 mg total) by mouth every 8 (eight) hours as needed for nausea or vomiting. Patient has recurrent nausea and vomiting.  She has gall stones but no evidence for active disease so far.  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  Diagnosis of neoplasm not yet made  Pelvic pain -     US Pelvis Complete Patient continues to have rlq pain, no hernias seen.        Follow-up: Return in about 1 month (around 08/11/2020) for abdominal pain.  An After Visit Summary was printed and given to the patient.  Reinaldo Meeker, MD [facility] [phone]

## 2020-07-14 ENCOUNTER — Other Ambulatory Visit: Payer: Medicare Other

## 2020-07-18 ENCOUNTER — Ambulatory Visit
Admission: RE | Admit: 2020-07-18 | Discharge: 2020-07-18 | Disposition: A | Discharge status: Home / Self Care | Payer: Medicare Other | Source: Ambulatory Visit | Attending: Legal Medicine | Admitting: Legal Medicine

## 2020-07-18 ENCOUNTER — Other Ambulatory Visit: Payer: Self-pay | Admitting: Family Medicine

## 2020-07-18 DIAGNOSIS — R1031 Right lower quadrant pain: Secondary | ICD-10-CM | POA: Diagnosis not present

## 2020-07-21 NOTE — Progress Notes (Signed)
Ultrasound normal lp

## 2020-07-21 NOTE — Progress Notes (Signed)
Normal ultrasound lp

## 2020-07-26 ENCOUNTER — Encounter: Payer: Self-pay | Admitting: Cardiovascular Disease

## 2020-07-26 ENCOUNTER — Other Ambulatory Visit: Payer: Self-pay

## 2020-07-26 ENCOUNTER — Ambulatory Visit (INDEPENDENT_AMBULATORY_CARE_PROVIDER_SITE_OTHER): Payer: Medicare Other | Admitting: Cardiovascular Disease

## 2020-07-26 VITALS — BP 114/70 | HR 81 | Ht 62.0 in | Wt 139.4 lb

## 2020-07-26 DIAGNOSIS — E782 Mixed hyperlipidemia: Secondary | ICD-10-CM

## 2020-07-26 DIAGNOSIS — I10 Essential (primary) hypertension: Secondary | ICD-10-CM

## 2020-07-26 DIAGNOSIS — I2510 Atherosclerotic heart disease of native coronary artery without angina pectoris: Secondary | ICD-10-CM

## 2020-07-26 DIAGNOSIS — I639 Cerebral infarction, unspecified: Secondary | ICD-10-CM

## 2020-07-26 DIAGNOSIS — I480 Paroxysmal atrial fibrillation: Secondary | ICD-10-CM

## 2020-07-26 MED ORDER — METOPROLOL SUCCINATE ER 25 MG PO TB24
25.0000 mg | ORAL_TABLET | Freq: Two times a day (BID) | ORAL | 3 refills | Status: DC
Start: 2020-07-26 — End: 2021-06-14

## 2020-07-26 MED ORDER — METOPROLOL SUCCINATE ER 25 MG PO TB24: 25.0000 mg | ORAL_TABLET | Freq: Two times a day (BID) | ORAL | 3 refills | Status: DC

## 2020-07-26 NOTE — Patient Instructions (Signed)
Medication Instructions:  1) INCREASE TOPROL to 25 mg twice daily *If you need a refill on your cardiac medications before your next appointment, please call your pharmacy*  Testing/Procedures: Your provider has requested that you have an echocardiogram. Echocardiography is a painless test that uses sound waves to create images of your heart. It provides your doctor with information about the size and shape of your heart and how well your heart's chambers and valves are working. This procedure takes approximately one hour. There are no restrictions for this procedure.  Follow-Up: You have been referred to [facility].  At [facility], you and your health needs are our priority.  As part of our continuing mission to provide you with exceptional heart care, we have created designated Provider Care Teams.  These Care Teams include your primary Cardiologist (physician) and Advanced Practice Providers (APPs -  Physician Assistants and Nurse Practitioners) who all work together to provide you with the care you need, when you need it. Your next appointment:   4 month(s) The format for your next appointment:   In Person Provider:   Sherren Mocha, MA   [facility] INFORMATION: Once scheduled, please arrive 15 minutes early for check-in. The [facility] is located in the [facility] at [facility]. Parking instructions/directions: [address] (off [address]). When you pull in to Entrance C, there is an underground parking garage to your right. The code to enter the garage is 4233. Take the elevators to the first floor. Follow the signs to the [facility] and [facility] Clinics. You will see registration at the end of the hallway.  Phone number: [phone]

## 2020-07-26 NOTE — Progress Notes (Signed)
Cardiology Office Note:    Date:  07/27/2020   ID:  Stacy [name], DOB 12-23-1926, MRN [id number]  PCP:  Lillard Anes, MD   [facility] Providers Cardiologist:  Sherren Mocha, MD Electrophysiologist:  Virl Axe, MD     Referring MD: Lillard Anes,*   Chief Complaint  Patient presents with   Coronary Artery Disease    History of Present Illness:    Stacy [name] is a 87 y.o. female with a hx of coronary artery disease and stroke, presenting for follow-up evaluation. The patient initially presented in 1997 with an anterior wall MI treated with balloon angioplasty of the LAD.  She later underwent stenting of the RCA in 2000.  Her most recent heart catheterization in 2008 demonstrated patency of her angioplasty and stent sites in the LAD and RCA, respectively.  The patient had a stroke in 2018.  A loop recorder demonstrated paroxysmal atrial fibrillation from which the patient has not had any associated symptoms.    The patient is here with her daughter today.  She was recently seen in the emergency department with abdominal pain.  She had a mechanical fall and landed on her buttocks.  Shortly thereafter she developed abdominal discomfort associated with constipation.  She underwent ER evaluation with a CT scan that showed no obvious pathology or obstructive lesions.  After few days she moved her bowels and began to feel much better.  However, she is now experiencing more fatigue and intolerance to activity.  She has some shortness of breath with activity as well.  No orthopnea, PND, palpitations, or chest pain.   Past Medical History:  Diagnosis Date   Anxiety    Hyperlipidemia     Past Surgical History:  Procedure Laterality Date   3 epidural steroid injections     APPENDECTOMY     Basal Cell Cancer Removal     forehead   CATARACT EXTRACTION     CATARACT EXTRACTION  12/29/2003   CORONARY ANGIOPLASTY  03-15-1995   CORONARY ANGIOPLASTY WITH STENT PLACEMENT   05-19-1998   CORONARY STENT PLACEMENT  2000   DILATION AND CURETTAGE OF UTERUS     ENDARTERECTOMY Right 04/13/2015   Procedure: ENDARTERECTOMY CAROTID;  Surgeon: Serafina Mitchell, MD;  Location: [place];  Service: Vascular;  Laterality: Right;   ENDOMETRIAL BIOPSY     EYE SURGERY     heart catherization     HEMORRHOID SURGERY     INCISIONAL HERNIA REPAIR     INGUINAL HERNIA REPAIR     LOOP RECORDER INSERTION N/A 06/24/2016   Procedure: Loop Recorder Insertion;  Surgeon: Deboraha Sprang, MD;  Location: [place] CV [facility];  Service: Cardiovascular;  Laterality: N/A;   PATCH ANGIOPLASTY Right 04/13/2015   Procedure: PATCH ANGIOPLASTY;  Surgeon: Serafina Mitchell, MD;  Location: [facility] OR;  Service: Vascular;  Laterality: Right;   PTCA     SQUAMOUS CELL CARCINOMA EXCISION     left lower leg   TEE WITHOUT CARDIOVERSION N/A 06/24/2016   Procedure: TRANSESOPHAGEAL ECHOCARDIOGRAM (TEE);  Surgeon: Dorothy Spark, MD;  Location: [facility] ENDOSCOPY;  Service: Cardiovascular;  Laterality: N/A;   TONSILLECTOMY      Current Medications: Current Meds  Medication Sig   ACCU-CHEK SMARTVIEW test strip 1 each by Other route as needed for other (blood sugar test strips).    acetaminophen-codeine (TYLENOL #3) 300-30 MG tablet Take 2 tablets by mouth every 4 (four) hours as needed for moderate pain.   cetirizine (  ZYRTEC) 10 MG tablet TAKE 1 TABLET BY MOUTH EVERY DAY   Cholecalciferol (VITAMIN D-3) 25 MCG (1000 UT) CAPS Take 1,000 Units by mouth 2 (two) times daily.   conjugated estrogens (PREMARIN) vaginal cream Place 1 g vaginally once a week. .625 mg once weekly   diphenhydrAMINE (BENADRYL) 25 MG tablet Take 25 mg by mouth every 6 (six) hours as needed.   docusate sodium (COLACE) 100 MG capsule Take 100 mg by mouth 2 (two) times daily.   ELIQUIS 5 MG TABS tablet TAKE 1 TABLET BY MOUTH TWICE A DAY   empagliflozin (JARDIANCE) 10 MG TABS tablet 1 tablet   Famotidine-Ca Carb-Mag Hydrox (PEPCID COMPLETE PO) Take by mouth.    ipratropium (ATROVENT) 0.06 % nasal spray ipratropium bromide 42 mcg (0.06 %) nasal spray  INSTILL 2 SPRAYS EACH NOSTRIL TWICE A DAY   linaclotide (LINZESS) 72 MCG capsule Take 72 mcg by mouth daily.   Melatonin 10 MG TABS Take 10 mg by mouth as needed.   metFORMIN (GLUCOPHAGE) 500 MG tablet Take 500 mg by mouth daily with breakfast. Taking BID   mometasone (ELOCON) 0.1 % cream mometasone 0.1 % topical cream  APPLY TO AFFECTED AREA EVERY DAY AS NEEDED ITCHING   NEOMYCIN-POLYMYXIN-HYDROCORTISONE (CORTISPORIN) 1 % SOLN otic solution Place 2 drops into both ears as directed.   nitroGLYCERIN (NITROSTAT) 0.4 MG SL tablet Place 1 tablet (0.4 mg total) under the tongue every 5 (five) minutes as needed for chest pain.   omeprazole (PRILOSEC OTC) 20 MG tablet Take 20 mg by mouth daily as needed (reflux).   ondansetron (ZOFRAN ODT) 4 MG disintegrating tablet Take 1 tablet (4 mg total) by mouth every 8 (eight) hours as needed for nausea or vomiting.   rosuvastatin (CRESTOR) 10 MG tablet TAKE 1 TABLET BY MOUTH EVERY DAY   sertraline (ZOLOFT) 50 MG tablet TAKE 1 TABLET BY MOUTH EVERY DAY   vitamin B-12 (CYANOCOBALAMIN) 1000 MCG tablet Take 1,000 mcg by mouth daily.   [DISCONTINUED] metoprolol succinate (TOPROL XL) 25 MG 24 hr tablet Take 1 tablet (25 mg total) by mouth in the morning and at bedtime.   [DISCONTINUED] metoprolol succinate (TOPROL-XL) 25 MG 24 hr tablet TAKE 1 TABLET BY MOUTH EVERY DAY     Allergies:   Iodinated diagnostic agents, Iohexol, Sulfonamide derivatives, Latex, Tranilast, Metformin hcl, Other, Tape, Wound dressing adhesive, and Sulfa antibiotics   Social History   Socioeconomic History   Marital status: Widowed    Spouse name: Not on file   Number of children: 1   Years of education: Not on file   Highest education level: Not on file  Occupational History   Occupation: Retired    Fish farm manager: RETIRED    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  Tobacco Use   Smoking status: Former     Packs/day: 1.00    Years: 54.00    Pack years: 54.00    Types: Cigarettes    Quit date: 10/05/1998    Years since quitting: 21.8   Smokeless tobacco: Never  Vaping Use   Vaping Use: Never used  Substance and Sexual Activity   Alcohol use: Not Currently    Alcohol/week: 0.0 standard drinks   Drug use: Never   Sexual activity: Not Currently  Other Topics Concern   Not on file  Social History Narrative   Not on file   Social Determinants of Health   Financial Resource Strain: Not on file  Food Insecurity: Not on file  Transportation Needs: Not on file  Physical Activity: Not on file  Stress: Not on file  Social Connections: Not on file     Family History: The patient's family history includes Cancer in her father; Diabetes in her father; Hypertension in her paternal uncle; Parkinsonism in her mother; Prostate cancer in her father; Stroke in her paternal uncle. There is no history of Heart attack.  ROS:   Please see the history of present illness.    All other systems reviewed and are negative.  EKGs/Labs/Other Studies Reviewed:    The following studies were reviewed today: CT Abdomen and Pelvis:  IMPRESSION: 1. No acute findings are noted in the abdomen or pelvis to account for the patient's symptoms. 2. Small umbilical hernia containing only omental fat. No associated bowel incarceration or obstruction at this time. 3. Cholelithiasis without evidence of acute cholecystitis at this time. 4. Aortic atherosclerosis, as well as right coronary artery disease. 5. Additional incidental findings, as above.   Carotid US 05/05/20: Summary:  Right Carotid: Velocities in the right ICA are consistent with a 1-39%  stenosis.   Left Carotid: Velocities in the left ICA are consistent with a 1-39%  stenosis.   Vertebrals:  Bilateral vertebral arteries demonstrate antegrade flow.  Subclavians: Normal flow hemodynamics were seen in bilateral subclavian               arteries.    EKG:  EKG is ordered today.  The ekg ordered today demonstrates atrial flutter with variable block, heart rate 110 bpm  Recent Labs: 02/22/2020: TSH 1.770 07/10/2020: ALT 41; BUN 15; Creatinine, Ser 1.33; Hemoglobin 13.7; Platelets 260; Potassium 4.2; Sodium 137  Recent Lipid Panel    Component Value Date/Time   CHOL 107 06/20/2020 1107   TRIG 104 06/20/2020 1107   HDL 41 06/20/2020 1107   CHOLHDL 2.6 06/20/2020 1107   CHOLHDL 2.4 06/23/2016 0441   VLDL 23 06/23/2016 0441   LDLCALC 47 06/20/2020 1107     Risk Assessment/Calculations:    CHA2DS2-VASc Score = 7  {Confirm score is correct.  If not, click here to update score.  REFRESH note. :[id number]}IOEV indicates a 11.2% annual risk of stroke. The patient's score is based upon: CHF History: No HTN History: Yes Diabetes History: No Stroke History: Yes Vascular Disease History: Yes Age Score: 2 Gender Score: 1      Physical Exam:    VS:  BP 114/70   Pulse 81   Ht 5\' 2"  (1.575 m)   Wt 139 lb 6.4 oz (63.2 kg)   SpO2 97%   BMI 25.50 kg/m     Wt Readings from Last 3 Encounters:  07/26/20 139 lb 6.4 oz (63.2 kg)  07/12/20 141 lb (64 kg)  06/22/20 148 lb (67.1 kg)     GEN:  Well nourished, well developed elderly woman in no acute distress HEENT: Normal NECK: No JVD; No carotid bruits LYMPHATICS: No lymphadenopathy CARDIAC: irregularly irregular anticoagulated with apixaban.  Treated with metoprolol succinate. , no murmurs, rubs, gallops RESPIRATORY:  Clear to auscultation without rales, wheezing or rhonchi  ABDOMEN: Soft, non-tender, non-distended MUSCULOSKELETAL:  No edema; No deformity  SKIN: Warm and dry NEUROLOGIC:  Alert and oriented x 3 PSYCHIATRIC:  Normal affect   ASSESSMENT:    1. Paroxysmal atrial fibrillation (HCC)   2. Coronary artery disease involving native coronary artery of native heart without angina pectoris   3. Essential hypertension   4. Mixed hyperlipidemia    PLAN:    In order  of problems listed above:  Anticoagulated with apixaban.  Rate controlled with metoprolol succinate.  The patient is in atrial flutter with a heart rate of 110 bpm today.  This may be contributing to her fatigue and exercise intolerance.  She was in sinus rhythm at the time of her ER evaluation on May 30.  I have recommended increasing her metoprolol succinate to 25 mg twice daily.  She should continue on apixaban without interruption.  I am going to check a 2D echocardiogram.  I will arrange follow-up in the atrial fibrillation clinic within 1 to 2 weeks.  If she stays out of rhythm, it might be reasonable to pursue an attempted cardioversion as we know she has primarily been in sinus rhythm on review of serial EKGs. The patient is stable with no symptoms of angina.  She is not on antiplatelet therapy because of chronic oral anticoagulation.  She is treated with a beta-blocker and a statin drug. Blood pressure well controlled, treated with metoprolol succinate.  Increase metoprolol succinate as outlined above. Treated with rosuvastatin.  Recent lipids show an LDL cholesterol of 47, HDL 41, total cholesterol 107.        Medication Adjustments/Labs and Tests Ordered: Current medicines are reviewed at length with the patient today.  Concerns regarding medicines are outlined above.  Orders Placed This Encounter  Procedures   Amb Referral to AFIB Clinic   EKG 12-Lead   ECHOCARDIOGRAM COMPLETE   Meds ordered this encounter  Medications   DISCONTD: metoprolol succinate (TOPROL XL) 25 MG 24 hr tablet    Sig: Take 1 tablet (25 mg total) by mouth in the morning and at bedtime.    Dispense:  180 tablet    Refill:  3   metoprolol succinate (TOPROL XL) 25 MG 24 hr tablet    Sig: Take 1 tablet (25 mg total) by mouth in the morning and at bedtime.    Dispense:  180 tablet    Refill:  3     Patient Instructions  Medication Instructions:  1) INCREASE TOPROL to 25 mg twice daily *If you need a  refill on your cardiac medications before your next appointment, please call your pharmacy*  Testing/Procedures: Your provider has requested that you have an echocardiogram. Echocardiography is a painless test that uses sound waves to create images of your heart. It provides your doctor with information about the size and shape of your heart and how well your heart's chambers and valves are working. This procedure takes approximately one hour. There are no restrictions for this procedure.  Follow-Up: You have been referred to AFIB CLINIC.  At [facility], you and your health needs are our priority.  As part of our continuing mission to provide you with exceptional heart care, we have created designated Provider Care Teams.  These Care Teams include your primary Cardiologist (physician) and Advanced Practice Providers (APPs -  Physician Assistants and Nurse Practitioners) who all work together to provide you with the care you need, when you need it. Your next appointment:   4 month(s) The format for your next appointment:   In Person Provider:   Sherren Mocha, MA   AFIB CLINIC INFORMATION: Once scheduled, please arrive 15 minutes early for check-in. The AFib Clinic is located in the Heart and Vascular Specialty Clinics at [facility]. Parking instructions/directions: [address] (off [address]). When you pull in to Entrance C, there is an underground parking garage to your right. The code to enter the garage is 4233. Take the  elevators to the first floor. Follow the signs to the Heart and Vascular Specialty Clinics. You will see registration at the end of the hallway.  Phone number: [phone]    Signed, Sherren Mocha, MD  07/27/2020 5:56 PM    [place]

## 2020-07-29 ENCOUNTER — Ambulatory Visit
Admission: RE | Admit: 2020-07-29 | Discharge: 2020-07-29 | Disposition: A | Discharge status: Home / Self Care | Payer: Medicare Other | Source: Ambulatory Visit | Attending: Legal Medicine | Admitting: Legal Medicine

## 2020-07-29 ENCOUNTER — Other Ambulatory Visit: Payer: Self-pay

## 2020-07-29 DIAGNOSIS — K8020 Calculus of gallbladder without cholecystitis without obstruction: Secondary | ICD-10-CM | POA: Diagnosis not present

## 2020-07-29 DIAGNOSIS — R160 Hepatomegaly, not elsewhere classified: Secondary | ICD-10-CM | POA: Diagnosis not present

## 2020-07-29 MED ORDER — GADOBENATE DIMEGLUMINE 529 MG/ML IV SOLN
15.0000 mL | Freq: Once | INTRAVENOUS | Status: AC | PRN
  Administered 2020-07-29: 15 mL via INTRAVENOUS

## 2020-07-31 NOTE — Progress Notes (Signed)
Liver abnormal, we need to see to discuss.set up referral to oncology, patient needs colonoscopy

## 2020-08-01 ENCOUNTER — Telehealth: Payer: Self-pay | Admitting: Oncology

## 2020-08-01 ENCOUNTER — Telehealth: Payer: Self-pay | Admitting: *Deleted

## 2020-08-01 NOTE — Telephone Encounter (Signed)
Stacy [name] called back to confirm her mother's appointment w/ Dr Benay Spice for 6/22 @ 1:15

## 2020-08-01 NOTE — Telephone Encounter (Signed)
Per Dr. Benay Spice: Schedule patient for new patient with Ned Card, NP on 08/04/20 at 1:45 for 1 hour. No lab. Dx: Livers masses Referring provider: Dr. Reinaldo Meeker Left VM for daughter on work and mobile voice mail with appointment information. Notified new patient scheduler.

## 2020-08-01 NOTE — Telephone Encounter (Signed)
Daughter, Rodena Piety returned call and has confirmed new patient appointment for 08/04/20 at 1:45 w/arrival at 1:30

## 2020-08-02 ENCOUNTER — Inpatient Hospital Stay: Payer: Medicare Other | Attending: Nurse Practitioner | Admitting: Oncology

## 2020-08-02 ENCOUNTER — Inpatient Hospital Stay: Payer: Medicare Other

## 2020-08-02 ENCOUNTER — Ambulatory Visit: Payer: Medicare Other | Admitting: Legal Medicine

## 2020-08-02 ENCOUNTER — Other Ambulatory Visit (HOSPITAL_BASED_OUTPATIENT_CLINIC_OR_DEPARTMENT_OTHER): Payer: Self-pay | Admitting: Emergency Medicine

## 2020-08-02 ENCOUNTER — Ambulatory Visit: Payer: Medicare Other

## 2020-08-02 ENCOUNTER — Other Ambulatory Visit: Payer: Self-pay

## 2020-08-02 VITALS — BP 102/82 | HR 92 | Temp 98.0°F | Resp 18 | Ht 62.0 in | Wt 140.8 lb

## 2020-08-02 DIAGNOSIS — C787 Secondary malignant neoplasm of liver and intrahepatic bile duct: Secondary | ICD-10-CM | POA: Diagnosis not present

## 2020-08-02 DIAGNOSIS — E279 Disorder of adrenal gland, unspecified: Secondary | ICD-10-CM | POA: Diagnosis not present

## 2020-08-02 DIAGNOSIS — C801 Malignant (primary) neoplasm, unspecified: Secondary | ICD-10-CM | POA: Diagnosis not present

## 2020-08-02 DIAGNOSIS — R160 Hepatomegaly, not elsewhere classified: Secondary | ICD-10-CM

## 2020-08-02 DIAGNOSIS — R932 Abnormal findings on diagnostic imaging of liver and biliary tract: Secondary | ICD-10-CM | POA: Diagnosis not present

## 2020-08-02 LAB — CEA (ACCESS): CEA (CHCC): 2.45 ng/mL (ref 0.00–5.00)

## 2020-08-02 NOTE — Progress Notes (Signed)
[facility] New Patient Consult   Requesting MD: Lillard Anes, Md [address] [place],  [place] [address]   Stacy [name] 87 y.o.  1926/09/14    Reason for Consult: Multiple liver masses   HPI: Stacy [name] is followed by Dr. Henrene Pastor for multiple medical conditions.  She developed severe constipation after a fall in May.  The constipation resolved after she was placed on Linzess in early June.  A chemistry panel on 07/10/2020 found the liver enzymes to be mildly elevated. She was referred for a CT of the abdomen pelvis 07/10/2020. No acute findings were noted.  Low-attenuation liver lesions were incompletely  characterized without IV contrast.  An abdominal ultrasound on 07/10/2020 revealed multiple masses in the liver, cholelithiasis, and no cholecystitis.  She was referred for an MRI of the abdomen on 07/30/2018.  This revealed diffuse hepatic involvement by innumerable hypervascular masses favoring metastatic disease.  There is a 1.5 cm left adrenal mass.  No pancreas mass.  The liver has a nodular capsular contour.  She reports resolution of constipation.  She has mild discomfort in the lower abdomen.  Stacy [name] is here today with her daughter. Past Medical History:  Diagnosis Date   Anxiety    Hyperlipidemia     Past Surgical History:  Procedure Laterality Date   3 epidural steroid injections     APPENDECTOMY     Basal Cell Cancer Removal     forehead   CATARACT EXTRACTION     CATARACT EXTRACTION  12/29/2003   CORONARY ANGIOPLASTY  03-15-1995   CORONARY ANGIOPLASTY WITH STENT PLACEMENT  05-19-1998   CORONARY STENT PLACEMENT  2000   DILATION AND CURETTAGE OF UTERUS     ENDARTERECTOMY Right 04/13/2015   Procedure: ENDARTERECTOMY CAROTID;  Surgeon: Serafina Mitchell, MD;  Location: [place];  Service: Vascular;  Laterality: Right;   ENDOMETRIAL BIOPSY     EYE SURGERY     heart catherization     HEMORRHOID SURGERY     INCISIONAL HERNIA REPAIR      INGUINAL HERNIA REPAIR     LOOP RECORDER INSERTION N/A 06/24/2016   Procedure: Loop Recorder Insertion;  Surgeon: Deboraha Sprang, MD;  Location: [place] CV LAB;  Service: Cardiovascular;  Laterality: N/A;   PATCH ANGIOPLASTY Right 04/13/2015   Procedure: PATCH ANGIOPLASTY;  Surgeon: Serafina Mitchell, MD;  Location: [facility] OR;  Service: Vascular;  Laterality: Right;   PTCA     SQUAMOUS CELL CARCINOMA EXCISION     left lower leg   TEE WITHOUT CARDIOVERSION N/A 06/24/2016   Procedure: TRANSESOPHAGEAL ECHOCARDIOGRAM (TEE);  Surgeon: Dorothy Spark, MD;  Location: [facility] ENDOSCOPY;  Service: Cardiovascular;  Laterality: N/A;   TONSILLECTOMY      Medications: Reviewed  Allergies:  Allergies  Allergen Reactions   Iodinated Diagnostic Agents Anaphylaxis and Rash   Iohexol Other (See Comments)     Desc: HIVES- 13 HR PRE-MEDS ARE REQUIRED-ASM- 03/21/05,SULFA,ADHESIVE TAPE    Sulfonamide Derivatives Hives    REACTION: hives   Latex Other (See Comments)    Adhesive tape and EKG adhesive leads to rash   Tranilast Hives    Investigational medication   Metformin Hcl     Other reaction(s): diarrhea, anorexia (at higher doses)   Other     Other reaction(s): Unknown   Tape Other (See Comments)    Adhesive tap redness   Wound Dressing Adhesive     Other reaction(s): Unknown  Sulfa Antibiotics Rash     Social History:   She lives alone in [place].  She is retired from working in a Dentist.  She quit smoking cigarettes in 2000.  No significant alcohol use.  No transfusion history.  No risk factor for HIV or hepatitis.  ROS:   Positives include: Anorexia, 11 pound weight loss, constipation prior to starting Linzess 07/15/2020, intermittent nausea, indigestion, she had hot flashes when she was constipated  A complete ROS was otherwise negative.  Physical Exam:  Blood pressure 102/82, pulse 92, temperature 98 F (36.7 C), temperature source Oral, resp. rate 18, height 5' 2"  (1.575 m),  weight 140 lb 12.8 oz (63.9 kg), SpO2 98 %.  HEENT: Neck without mass Lungs: End inspiratory rales Cardiac: Irregular Abdomen: Masslike fullness in the medial right subcostal region, no splenomegaly, no apparent ascites  Vascular: No leg edema Lymph nodes: No cervical, supraclavicular, axillary, or inguinal nodes Neurologic: Alert and oriented, the motor exam appears intact in the upper and lower extremities bilaterally Skin: Multiple benign-appearing moles over the trunk Musculoskeletal: No spine tenderness Breast: Bilateral breast without mass   LAB:  CBC  Lab Results  Component Value Date   WBC 9.1 07/10/2020   HGB 13.7 07/10/2020   HCT 44.8 07/10/2020   MCV 88.0 07/10/2020   PLT 260 07/10/2020   NEUTROABS 6.0 07/10/2020        CMP  Lab Results  Component Value Date   NA 137 07/10/2020   K 4.2 07/10/2020   CL 102 07/10/2020   CO2 24 07/10/2020   GLUCOSE 155 (H) 07/10/2020   BUN 15 07/10/2020   CREATININE 1.33 (H) 07/10/2020   CALCIUM 9.3 07/10/2020   PROT 6.7 07/10/2020   ALBUMIN 3.1 (L) 07/10/2020   AST 94 (H) 07/10/2020   ALT 41 07/10/2020   ALKPHOS 207 (H) 07/10/2020   BILITOT 1.0 07/10/2020   GFRNONAA 38 (L) 07/10/2020   GFRAA 54 (L) 02/22/2020      Imaging: As per HPI, CT abdomen/pelvis and MRI abdomen reviewed with [name] and her daughter   Assessment/Plan:   Multiple liver masses CT abdomen/pelvis 07/11/2018, low-attenuation liver lesions incompletely characterized without contrast Ultrasound abdomen 07/10/2020-innumerable incompletely assessed hypoechoic masses in the liver MRI abdomen 07/29/2020- innumerable hypervascular liver masses, nodular liver contour, 1.5 cm left adrenal mass Coronary artery disease Atrial fibrillation-maintained on anticoagulation History of basal cell and squamous cell skin cancers Diabetes Anorexia/weight loss Abdominal pain    Disposition:   Stacy [name] is referred for oncology evaluation after she was  found to have diffuse liver masses on multiple imaging studies.  There is no apparent primary tumor site upon review of her history, laboratory studies, and physical examination.  She underwent upper and lower endoscopy procedures in 2013 with tubular adenomas removed from the colon and reflux esophagitis on a GE junction biopsy.  I reviewed the images and discussed the differential diagnosis with Stacy [name] and her daughter.  The differential diagnosis includes multifocal hepatocellular carcinoma, cholangiocarcinoma, colorectal cancer, other GI malignancies, in addition to metastatic disease from other primary tumor sites.  She will be referred for an ultrasound-guided biopsy of a liver lesion prior to an office visit next week.  We checked a CEA, AFP, and CA 19-9 today.  [name], MD  08/02/2020, 2:53 PM

## 2020-08-03 ENCOUNTER — Encounter (HOSPITAL_COMMUNITY): Payer: Self-pay

## 2020-08-03 ENCOUNTER — Ambulatory Visit (HOSPITAL_COMMUNITY)
Admission: RE | Admit: 2020-08-03 | Discharge: 2020-08-03 | Disposition: A | Discharge status: Home / Self Care | Payer: Medicare Other | Source: Ambulatory Visit | Attending: Physician Assistant | Admitting: Physician Assistant

## 2020-08-03 ENCOUNTER — Encounter (HOSPITAL_COMMUNITY): Payer: Self-pay | Admitting: Physician Assistant

## 2020-08-03 VITALS — BP 118/68 | HR 78 | Ht 62.0 in | Wt 141.2 lb

## 2020-08-03 DIAGNOSIS — I480 Paroxysmal atrial fibrillation: Secondary | ICD-10-CM

## 2020-08-03 DIAGNOSIS — Z7901 Long term (current) use of anticoagulants: Secondary | ICD-10-CM | POA: Insufficient documentation

## 2020-08-03 DIAGNOSIS — I10 Essential (primary) hypertension: Secondary | ICD-10-CM | POA: Insufficient documentation

## 2020-08-03 DIAGNOSIS — I2510 Atherosclerotic heart disease of native coronary artery without angina pectoris: Secondary | ICD-10-CM | POA: Diagnosis not present

## 2020-08-03 DIAGNOSIS — Z91040 Latex allergy status: Secondary | ICD-10-CM | POA: Insufficient documentation

## 2020-08-03 DIAGNOSIS — Z87891 Personal history of nicotine dependence: Secondary | ICD-10-CM | POA: Diagnosis not present

## 2020-08-03 DIAGNOSIS — I4892 Unspecified atrial flutter: Secondary | ICD-10-CM | POA: Insufficient documentation

## 2020-08-03 DIAGNOSIS — I484 Atypical atrial flutter: Secondary | ICD-10-CM | POA: Insufficient documentation

## 2020-08-03 NOTE — Progress Notes (Signed)
Primary Care Physician: Lillard Anes, MD Primary Cardiologist: Dr Burt Knack Primary Electrophysiologist: Dr Caryl Comes Referring Physician: Dr Canary Brim is a 87 y.o. female with a history of CVA, CAD, CKD, DM, HLD, HTN, paroxysmal atrial fibrillation who presents for follow up in the [facility].  The patient was initially diagnosed with atrial fibrillation on ILR which was implanted after a cryptogenic stroke in 2018. Patient has done very well with a low AF burden on metoprolol. She is on Eliquis for a CHADS2VASC score of 8. On 12/29/18, the device clinic received an alert that she had been in afib with RVR since 12/28/18. Patient initially stated she was asymptomatic but in hindsight feels that she had been more fatigued over the few days previous. She converted spontaneously to SR.  On follow up today, patient was seen by Dr Burt Knack on 07/26/20 and was found to be in atrial flutter with symptoms of fatigue. Her BB was increased and her symptoms resolved. She is in SR today. She denies any bleeding issues on anticoagulation.   Today, she denies symptoms of palpitations, chest pain, shortness of breath, orthopnea, PND, lower extremity edema, dizziness, presyncope, syncope, snoring, daytime somnolence, bleeding, or neurologic sequela. The patient is tolerating medications without difficulties and is otherwise without complaint today.    Atrial Fibrillation Risk Factors:  she does not have symptoms or diagnosis of sleep apnea. she does not have a history of rheumatic fever.   she has a BMI of Body mass index is 25.83 kg/m.Marland Kitchen Filed Weights   08/03/20 1533  Weight: 64 kg    Family History  Problem Relation Age of Onset   Diabetes Father    Prostate cancer Father        meta to colon   Cancer Father        Prostate   Parkinsonism Mother    Stroke Paternal Uncle    Hypertension Paternal Uncle    Heart attack Neg Hx      Atrial  Fibrillation Management history:  Previous antiarrhythmic drugs: none Previous cardioversions: none Previous ablations: none CHADS2VASC score: 8 Anticoagulation history: Eliquis   Past Medical History:  Diagnosis Date   Anxiety    Hyperlipidemia    Past Surgical History:  Procedure Laterality Date   3 epidural steroid injections     APPENDECTOMY     Basal Cell Cancer Removal     forehead   CATARACT EXTRACTION     CATARACT EXTRACTION  12/29/2003   CORONARY ANGIOPLASTY  03-15-1995   CORONARY ANGIOPLASTY WITH STENT PLACEMENT  05-19-1998   CORONARY STENT PLACEMENT  2000   DILATION AND CURETTAGE OF UTERUS     ENDARTERECTOMY Right 04/13/2015   Procedure: ENDARTERECTOMY CAROTID;  Surgeon: Serafina Mitchell, MD;  Location: [facility];  Service: Vascular;  Laterality: Right;   ENDOMETRIAL BIOPSY     EYE SURGERY     heart catherization     HEMORRHOID SURGERY     INCISIONAL HERNIA REPAIR     INGUINAL HERNIA REPAIR     LOOP RECORDER INSERTION N/A 06/24/2016   Procedure: Loop Recorder Insertion;  Surgeon: Deboraha Sprang, MD;  Location: [facility];  Service: Cardiovascular;  Laterality: N/A;   PATCH ANGIOPLASTY Right 04/13/2015   Procedure: PATCH ANGIOPLASTY;  Surgeon: Serafina Mitchell, MD;  Location: [facility] OR;  Service: Vascular;  Laterality: Right;   PTCA     SQUAMOUS CELL CARCINOMA EXCISION  left lower leg   TEE WITHOUT CARDIOVERSION N/A 06/24/2016   Procedure: TRANSESOPHAGEAL ECHOCARDIOGRAM (TEE);  Surgeon: Dorothy Spark, MD;  Location: [facility] ENDOSCOPY;  Service: Cardiovascular;  Laterality: N/A;   TONSILLECTOMY      Current Outpatient Medications  Medication Sig Dispense Refill   ACCU-CHEK SMARTVIEW test strip 1 each by Other route as needed for other (blood sugar test strips).   11   acetaminophen-codeine (TYLENOL #3) 300-30 MG tablet Take 2 tablets by mouth every 4 (four) hours as needed for moderate pain. 30 tablet 0   cetirizine (ZYRTEC) 10 MG tablet TAKE 1 TABLET BY MOUTH  EVERY DAY 90 tablet 2   Cholecalciferol (VITAMIN D-3) 25 MCG (1000 UT) CAPS Take 1,000 Units by mouth 2 (two) times daily.     conjugated estrogens (PREMARIN) vaginal cream Place 1 g vaginally once a week. .625 mg once weekly     diphenhydrAMINE (BENADRYL) 25 MG tablet Take 25 mg by mouth every 6 (six) hours as needed.     docusate sodium (COLACE) 100 MG capsule Take 100 mg by mouth 2 (two) times daily. As needed     ELIQUIS 5 MG TABS tablet TAKE 1 TABLET BY MOUTH TWICE A DAY 180 tablet 1   empagliflozin (JARDIANCE) 10 MG TABS tablet 1 tablet     Famotidine-Ca Carb-Mag Hydrox (PEPCID COMPLETE PO) Take by mouth.     ipratropium (ATROVENT) 0.06 % nasal spray As needed     linaclotide (LINZESS) 72 MCG capsule Take 72 mcg by mouth daily. As needed     Melatonin 10 MG TABS Take 10 mg by mouth as needed.     metFORMIN (GLUCOPHAGE) 500 MG tablet Take 500 mg by mouth daily with breakfast. Taking BID     metoprolol succinate (TOPROL XL) 25 MG 24 hr tablet Take 1 tablet (25 mg total) by mouth in the morning and at bedtime. 180 tablet 3   mometasone (ELOCON) 0.1 % cream mometasone 0.1 % topical cream  APPLY TO AFFECTED AREA EVERY DAY AS NEEDED ITCHING     NEOMYCIN-POLYMYXIN-HYDROCORTISONE (CORTISPORIN) 1 % SOLN otic solution Place 2 drops into both ears as directed.  3   nitroGLYCERIN (NITROSTAT) 0.4 MG SL tablet Place 1 tablet (0.4 mg total) under the tongue every 5 (five) minutes as needed for chest pain. 25 tablet 3   omeprazole (PRILOSEC OTC) 20 MG tablet Take 20 mg by mouth daily as needed (reflux).     ondansetron (ZOFRAN ODT) 4 MG disintegrating tablet Take 1 tablet (4 mg total) by mouth every 8 (eight) hours as needed for nausea or vomiting. 30 tablet 3   rosuvastatin (CRESTOR) 10 MG tablet TAKE 1 TABLET BY MOUTH EVERY DAY 90 tablet 3   sertraline (ZOLOFT) 50 MG tablet TAKE 1 TABLET BY MOUTH EVERY DAY 90 tablet 2   vitamin B-12 (CYANOCOBALAMIN) 1000 MCG tablet Take 1,000 mcg by mouth daily.      No current facility-administered medications for this encounter.    Allergies  Allergen Reactions   Iodinated Diagnostic Agents Anaphylaxis and Rash   Iohexol Other (See Comments)     Desc: HIVES- 13 HR PRE-MEDS ARE REQUIRED-ASM- 03/21/05,SULFA,ADHESIVE TAPE    Sulfonamide Derivatives Hives    REACTION: hives   Latex Other (See Comments)    Adhesive tape and EKG adhesive leads to rash   Tranilast Hives    Investigational medication   Metformin Hcl     Other reaction(s): diarrhea, anorexia (at higher doses)   Other  Other reaction(s): Unknown   Tape Other (See Comments)    Adhesive tap redness   Wound Dressing Adhesive     Other reaction(s): Unknown   Sulfa Antibiotics Rash    Social History   Socioeconomic History   Marital status: Widowed    Spouse name: Not on file   Number of children: 1   Years of education: Not on file   Highest education level: Not on file  Occupational History   Occupation: Retired    Fish farm manager: RETIRED    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  Tobacco Use   Smoking status: Former    Packs/day: 1.00    Years: 54.00    Pack years: 54.00    Types: Cigarettes    Quit date: 10/05/1998    Years since quitting: 21.8   Smokeless tobacco: Never  Vaping Use   Vaping Use: Never used  Substance and Sexual Activity   Alcohol use: Not Currently    Alcohol/week: 0.0 standard drinks   Drug use: Never   Sexual activity: Not Currently  Other Topics Concern   Not on file  Social History Narrative   Not on file   Social Determinants of Health   Financial Resource Strain: Not on file  Food Insecurity: Not on file  Transportation Needs: Not on file  Physical Activity: Not on file  Stress: Not on file  Social Connections: Not on file  Intimate Partner Violence: Not on file     ROS- All systems are reviewed and negative except as per the HPI above.  Physical Exam: Vitals:   08/03/20 1533  BP: 118/68  Pulse: 78  Weight: 64 kg  Height: _0  (1.575  m)    GEN- The patient is a well appearing elderly female, alert and oriented x 3 today.   HEENT-head normocephalic, atraumatic, sclera clear, conjunctiva pink, hearing intact, trachea midline. Lungs- Clear to ausculation bilaterally, normal work of breathing Heart- Regular rate and rhythm, no murmurs, rubs or gallops  GI- soft, NT, ND, + BS Extremities- no clubbing, cyanosis, or edema MS- no significant deformity or atrophy Skin- no rash or lesion Psych- euthymic mood, full affect Neuro- strength and sensation are intact   Wt Readings from Last 3 Encounters:  08/03/20 64 kg  08/02/20 63.9 kg  07/26/20 63.2 kg    EKG today demonstrates  SR, PACs, inc RBBB Vent. rate 78 BPM PR interval 160 ms QRS duration 114 ms QT/QTcB 396/451 ms  Echo 06/23/16 demonstrated  - Left ventricle: There is a large apical and distal anterior   akinetic area. No definate thrombus is seen but if embolus   suspected may repeat with Definity. Virgie Dad is also a   consideration. The cavity size was normal. Wall thickness was   increased in a pattern of mild LVH. Systolic function was mildly   to moderately reduced. The estimated ejection fraction was in the   range of 40% to 45%. Akinesis of the mid-apicalanterior and   apical myocardium. - Mitral valve: There was moderate regurgitation directed   posteriorly and toward the free wall. - Left atrium: The atrium was mildly dilated. - Tricuspid valve: There was moderate regurgitation. - Pulmonary arteries: Systolic pressure was mildly increased. PA   peak pressure: 40 mm Hg (S).  Epic records are reviewed at length today   Assessment and Plan:  1. Paroxysmal atrial fibrillation/atrial flutter Patient back in SR today, will not pursue DCCV.  Continue Eliquis 5 mg BID, watch weight closely.  Continue Toprol 25  mg BID.  This patients CHA2DS2-VASc Score and unadjusted Ischemic Stroke Rate (% per year) is equal to 10.8 % stroke rate/year from a  score of 8  Above score calculated as 1 point each if present [CHF, HTN, DM, Vascular=MI/PAD/Aortic Plaque, Age if 65-74, or Female] Above score calculated as 2 points each if present [Age > 75, or Stroke/TIA/TE]  2. CAD S/p PCI in LAD and RCA. No anginal symptoms.  3. HTN Stable, no changes today.   Follow up with Dr Burt Knack as scheduled and with Dr Caryl Comes per recall.    [facility] [address] [place], [place] [address] [phone] 08/03/2020 4:11 PM

## 2020-08-03 NOTE — Progress Notes (Unsigned)
       Patient Demographics  Patient Name  Stacy [name], Stacy [name] Legal Sex  Female DOB  Aug 07, 1926 SSN  [id number] Address  [place] [address] Phone  [phone] (Home)  [phone] (Mobile) *Preferred*     RE: Biopsy Received: Hillis Range, MD  Ernestene Mention for liver lesion biopsy.   [place]enn         Previous Messages    ----- Message -----  From: Lenore Cordia  Sent: 08/02/2020   2:47 PM EDT  To: Ir Procedure Requests  Subject: Biopsy                                         Procedure Requested:  US Biopsy    Reason for Procedure:  multiple liver lesions    Provider Requesting: Dr Benay Spice  Provider Telephone:  [phone]    Other Info:

## 2020-08-03 NOTE — Progress Notes (Signed)
       Patient Demographics  Patient Name  Virgil, Slinger Legal Sex  Female DOB  [birth year] SSN  [id number] Address  [place] [address] Phone  [phone] (Home)  [phone] (Mobile) *Preferred*     RE: Biopsy Received: Today Kelli Hope, LPN  [place] Leigh, Dr Benay Spice says in his orders they have to reach out to her cardiologist Dr Burt Knack to [facility] holding blood thinner on his stand point its ok.         Previous Messages    ----- Message -----  From: Lenore Cordia  Sent: 08/03/2020   9:41 AM EDT  To: Ladell Pier, MD, Kelli Hope, LPN  Subject: FW: Biopsy                                     Hello   Pt. Shailynn is on the Blood thinner Eliquis  She will need to hold her Blood thinner two days  Prior to her biopsy which is scheduled for June 28th  Permission is needed.   Jesseca Marsch  ----- Message -----  From: Markus Daft, MD  Sent: 08/02/2020   4:48 PM EDT  To: Lenore Cordia  Subject: RE: Biopsy                                     Ok for liver lesion biopsy.   [place]enn  ----- Message -----  From: Lenore Cordia  Sent: 08/02/2020   2:47 PM EDT  To: Ir Procedure Requests  Subject: Biopsy                                         Procedure Requested:  US Biopsy    Reason for Procedure:  multiple liver lesions    Provider Requesting: Dr Benay Spice  Provider Telephone:  [phone]    Other Info:

## 2020-08-04 ENCOUNTER — Encounter: Payer: Self-pay | Admitting: Legal Medicine

## 2020-08-04 ENCOUNTER — Other Ambulatory Visit: Payer: Self-pay

## 2020-08-04 ENCOUNTER — Ambulatory Visit: Payer: Medicare Other | Admitting: Nurse Practitioner

## 2020-08-04 ENCOUNTER — Encounter (HOSPITAL_COMMUNITY): Payer: Self-pay

## 2020-08-04 ENCOUNTER — Ambulatory Visit (INDEPENDENT_AMBULATORY_CARE_PROVIDER_SITE_OTHER): Payer: Medicare Other | Admitting: Legal Medicine

## 2020-08-04 DIAGNOSIS — C787 Secondary malignant neoplasm of liver and intrahepatic bile duct: Secondary | ICD-10-CM | POA: Insufficient documentation

## 2020-08-04 LAB — CANCER ANTIGEN 19-9: CA 19-9: 27 U/mL (ref 0–35)

## 2020-08-04 LAB — AFP TUMOR MARKER: AFP, Serum, Tumor Marker: 4.1 ng/mL (ref 0.0–8.7)

## 2020-08-04 NOTE — Progress Notes (Unsigned)
       Patient Demographics  Patient Name  Stacy [name], Stacy [name] Legal Sex  Female DOB  1927/01/29 SSN  [id number] Address  [place] [address] Phone  [phone] (Home) *Preferred*  [phone] (Mobile)     FW: Clearance Needed Received: Today Tania Ade, RN  [facility] E Please see message below by Dr. Burt Knack (CD)--ok to hold Eliquis x 2 days for procedure.  Susan,RN         Previous Messages    ----- Message -----  From: Sherren Mocha, MD  Sent: 08/04/2020   9:28 AM EDT  To: Tania Ade, RN  Subject: RE: Clearance Needed                           Yes this is fine thank you  ----- Message -----  From: Tania Ade, RN  Sent: 08/03/2020   3:37 PM EDT  To: Sherren Mocha, MD  Subject: Clearance Needed                               Dr. Burt Knack,  She is scheduled for liver biopsy on 08/08/20 and IR requesting to hold Eliquis x 2 days. Is this OK with you?  Thank you,  [facility] w/[facility]  [phone]

## 2020-08-04 NOTE — Progress Notes (Signed)
Established Patient Office Visit  Subjective:  Patient ID: Stacy [name], female    DOB: 10-Nov-1926  Age: 87 y.o. MRN: [id number]  CC:  Chief Complaint  Patient presents with   Abdominal Pain    Patient is here to Discuss MRI results.    HPI Stacy [name] presents for abdominal pain.  Mri of liver demonstrates metastases, she saw Dr. Learta Codding and he plans liver biopsy.  Family Is aware of diagnosis.  Her pain in abdomen. Is improved on linzess.  We discussed at length.  I discussed with patient and her daughter who comes with her about the MRI which demonstrated probable metastases of unknown primary.  Daughter has already brought her to see Dr. Learta Codding who is an oncologist and they he plans to do a biopsy.  They both seem to be in good spirits and excepted a diagnosis and prognosis the daughter is very supportive of the mom.  Past Medical History:  Diagnosis Date   Anxiety    Hyperlipidemia     Past Surgical History:  Procedure Laterality Date   3 epidural steroid injections     APPENDECTOMY     Basal Cell Cancer Removal     forehead   CATARACT EXTRACTION     CATARACT EXTRACTION  12/29/2003   CORONARY ANGIOPLASTY  03-15-1995   CORONARY ANGIOPLASTY WITH STENT PLACEMENT  05-19-1998   CORONARY STENT PLACEMENT  2000   DILATION AND CURETTAGE OF UTERUS     ENDARTERECTOMY Right 04/13/2015   Procedure: ENDARTERECTOMY CAROTID;  Surgeon: Serafina Mitchell, MD;  Location: [facility];  Service: Vascular;  Laterality: Right;   ENDOMETRIAL BIOPSY     EYE SURGERY     heart catherization     HEMORRHOID SURGERY     INCISIONAL HERNIA REPAIR     INGUINAL HERNIA REPAIR     LOOP RECORDER INSERTION N/A 06/24/2016   Procedure: Loop Recorder Insertion;  Surgeon: Deboraha Sprang, MD;  Location: [facility];  Service: Cardiovascular;  Laterality: N/A;   PATCH ANGIOPLASTY Right 04/13/2015   Procedure: PATCH ANGIOPLASTY;  Surgeon: Serafina Mitchell, MD;  Location: [facility];  Service: Vascular;  Laterality:  Right;   PTCA     SQUAMOUS CELL CARCINOMA EXCISION     left lower leg   TEE WITHOUT CARDIOVERSION N/A 06/24/2016   Procedure: TRANSESOPHAGEAL ECHOCARDIOGRAM (TEE);  Surgeon: Dorothy Spark, MD;  Location: [facility] ENDOSCOPY;  Service: Cardiovascular;  Laterality: N/A;   TONSILLECTOMY      Family History  Problem Relation Age of Onset   Diabetes Father    Prostate cancer Father        meta to colon   Cancer Father        Prostate   Parkinsonism Mother    Stroke Paternal Uncle    Hypertension Paternal Uncle    Heart attack Neg Hx     Social History   Socioeconomic History   Marital status: Widowed    Spouse name: Not on file   Number of children: 1   Years of education: Not on file   Highest education level: Not on file  Occupational History   Occupation: Retired    Fish farm manager: RETIRED    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  Tobacco Use   Smoking status: Former    Packs/day: 1.00    Years: 54.00    Pack years: 54.00    Types: Cigarettes    Quit date: 10/05/1998    Years since quitting: 21.8  Smokeless tobacco: Never  Vaping Use   Vaping Use: Never used  Substance and Sexual Activity   Alcohol use: Not Currently    Alcohol/week: 0.0 standard drinks   Drug use: Never   Sexual activity: Not Currently  Other Topics Concern   Not on file  Social History Narrative   Not on file   Social Determinants of Health   Financial Resource Strain: Not on file  Food Insecurity: Not on file  Transportation Needs: Not on file  Physical Activity: Not on file  Stress: Not on file  Social Connections: Not on file  Intimate Partner Violence: Not on file    Outpatient Medications Prior to Visit  Medication Sig Dispense Refill   ACCU-CHEK SMARTVIEW test strip 1 each by Other route as needed for other (blood sugar test strips).   11   acetaminophen-codeine (TYLENOL #3) 300-30 MG tablet Take 2 tablets by mouth every 4 (four) hours as needed for moderate pain. 30 tablet 0   cetirizine (ZYRTEC)  10 MG tablet TAKE 1 TABLET BY MOUTH EVERY DAY 90 tablet 2   Cholecalciferol (VITAMIN D-3) 25 MCG (1000 UT) CAPS Take 1,000 Units by mouth 2 (two) times daily.     conjugated estrogens (PREMARIN) vaginal cream Place 1 g vaginally once a week. .625 mg once weekly     diphenhydrAMINE (BENADRYL) 25 MG tablet Take 25 mg by mouth every 6 (six) hours as needed.     docusate sodium (COLACE) 100 MG capsule Take 100 mg by mouth 2 (two) times daily. As needed     ELIQUIS 5 MG TABS tablet TAKE 1 TABLET BY MOUTH TWICE A DAY 180 tablet 1   empagliflozin (JARDIANCE) 10 MG TABS tablet 1 tablet     Famotidine-Ca Carb-Mag Hydrox (PEPCID COMPLETE PO) Take by mouth.     ipratropium (ATROVENT) 0.06 % nasal spray As needed     linaclotide (LINZESS) 72 MCG capsule Take 72 mcg by mouth daily. As needed     Melatonin 10 MG TABS Take 10 mg by mouth as needed.     metFORMIN (GLUCOPHAGE) 500 MG tablet Take 500 mg by mouth daily with breakfast. Taking BID     metoprolol succinate (TOPROL XL) 25 MG 24 hr tablet Take 1 tablet (25 mg total) by mouth in the morning and at bedtime. 180 tablet 3   mometasone (ELOCON) 0.1 % cream mometasone 0.1 % topical cream  APPLY TO AFFECTED AREA EVERY DAY AS NEEDED ITCHING     NEOMYCIN-POLYMYXIN-HYDROCORTISONE (CORTISPORIN) 1 % SOLN otic solution Place 2 drops into both ears as directed.  3   nitroGLYCERIN (NITROSTAT) 0.4 MG SL tablet Place 1 tablet (0.4 mg total) under the tongue every 5 (five) minutes as needed for chest pain. 25 tablet 3   omeprazole (PRILOSEC OTC) 20 MG tablet Take 20 mg by mouth daily as needed (reflux).     ondansetron (ZOFRAN ODT) 4 MG disintegrating tablet Take 1 tablet (4 mg total) by mouth every 8 (eight) hours as needed for nausea or vomiting. 30 tablet 3   rosuvastatin (CRESTOR) 10 MG tablet TAKE 1 TABLET BY MOUTH EVERY DAY 90 tablet 3   sertraline (ZOLOFT) 50 MG tablet TAKE 1 TABLET BY MOUTH EVERY DAY 90 tablet 2   vitamin B-12 (CYANOCOBALAMIN) 1000 MCG tablet  Take 1,000 mcg by mouth daily.     No facility-administered medications prior to visit.    Allergies  Allergen Reactions   Iodinated Diagnostic Agents Anaphylaxis and Rash  Iohexol Other (See Comments)     Desc: HIVES- 13 HR PRE-MEDS ARE REQUIRED-ASM- 03/21/05,SULFA,ADHESIVE TAPE    Sulfonamide Derivatives Hives    REACTION: hives   Latex Other (See Comments)    Adhesive tape and EKG adhesive leads to rash   Tranilast Hives    Investigational medication   Metformin Hcl     Other reaction(s): diarrhea, anorexia (at higher doses)   Other     Other reaction(s): Unknown   Tape Other (See Comments)    Adhesive tap redness   Wound Dressing Adhesive     Other reaction(s): Unknown   Sulfa Antibiotics Rash    ROS Review of Systems  Constitutional:  Negative for activity change and appetite change.  HENT:  Negative for congestion and ear pain.   Eyes:  Negative for visual disturbance.  Respiratory:  Negative for apnea, cough and chest tightness.   Cardiovascular:  Negative for chest pain, palpitations and leg swelling.  Gastrointestinal: Negative.   Genitourinary: Negative.   Musculoskeletal: Negative.   Skin: Negative.   Neurological: Negative.   Psychiatric/Behavioral: Negative.       Objective:    Physical Exam Vitals reviewed.  Constitutional:      Appearance: She is well-developed.  Cardiovascular:     Rate and Rhythm: Normal rate. Rhythm irregular.     Heart sounds: Normal heart sounds. No murmur heard.   No gallop.  Pulmonary:     Effort: Pulmonary effort is normal. No respiratory distress.     Breath sounds: Normal breath sounds. No wheezing.  Abdominal:     General: Abdomen is flat, scaphoid and protuberant. Bowel sounds are increased.     Palpations: Abdomen is soft.     Tenderness: There is no abdominal tenderness.  Skin:    General: Skin is warm.     Capillary Refill: Capillary refill takes less than 2 seconds.  Neurological:     General: No focal  deficit present.     Mental Status: She is alert.    BP 112/60   Pulse 80   Temp (!) 97.4 F (36.3 C)   Resp 16   Ht 5' 2"  (1.575 m)   Wt 141 lb (64 kg)   SpO2 92%   BMI 25.79 kg/m  Wt Readings from Last 3 Encounters:  08/04/20 141 lb (64 kg)  08/03/20 141 lb 3.2 oz (64 kg)  08/02/20 140 lb 12.8 oz (63.9 kg)     Health Maintenance Due  Topic Date Due   TETANUS/TDAP  Never done   Zoster Vaccines- Shingrix (1 of 2) Never done   COVID-19 Vaccine (4 - Booster for Pfizer series) 03/17/2020    There are no preventive care reminders to display for this patient.  Lab Results  Component Value Date   TSH 1.770 02/22/2020   Lab Results  Component Value Date   WBC 9.1 07/10/2020   HGB 13.7 07/10/2020   HCT 44.8 07/10/2020   MCV 88.0 07/10/2020   PLT 260 07/10/2020   Lab Results  Component Value Date   NA 137 07/10/2020   K 4.2 07/10/2020   CO2 24 07/10/2020   GLUCOSE 155 (H) 07/10/2020   BUN 15 07/10/2020   CREATININE 1.33 (H) 07/10/2020   BILITOT 1.0 07/10/2020   ALKPHOS 207 (H) 07/10/2020   AST 94 (H) 07/10/2020   ALT 41 07/10/2020   PROT 6.7 07/10/2020   ALBUMIN 3.1 (L) 07/10/2020   CALCIUM 9.3 07/10/2020   ANIONGAP 11 07/10/2020   EGFR  48 (L) 06/20/2020   GFR 56.18 (L) 01/16/2012   Lab Results  Component Value Date   CHOL 107 06/20/2020   Lab Results  Component Value Date   HDL 41 06/20/2020   Lab Results  Component Value Date   LDLCALC 47 06/20/2020   Lab Results  Component Value Date   TRIG 104 06/20/2020   Lab Results  Component Value Date   CHOLHDL 2.6 06/20/2020   Lab Results  Component Value Date   HGBA1C 7.7 (H) 06/20/2020      Assessment & Plan:   Diagnoses and all orders for this visit: Metastases to the liver [facility])  Patient had an MRI which demonstrated multiple metastases of unknown primary.  We discussed getting a colonoscopy before she will get a biopsy by Dr. Malachy Mood family is taking this well.  We will continue to follow  along and support the patient.   Follow-up: Return in about 3 months (around 11/04/2020).    Reinaldo Meeker, MD

## 2020-08-07 ENCOUNTER — Other Ambulatory Visit: Payer: Self-pay | Admitting: Student

## 2020-08-08 ENCOUNTER — Encounter (HOSPITAL_COMMUNITY): Payer: Self-pay

## 2020-08-08 ENCOUNTER — Ambulatory Visit (HOSPITAL_COMMUNITY)
Admission: RE | Admit: 2020-08-08 | Discharge: 2020-08-08 | Disposition: A | Discharge status: Home / Self Care | Payer: Medicare Other | Source: Ambulatory Visit | Attending: Oncology | Admitting: Oncology

## 2020-08-08 ENCOUNTER — Other Ambulatory Visit: Payer: Self-pay

## 2020-08-08 DIAGNOSIS — I129 Hypertensive chronic kidney disease with stage 1 through stage 4 chronic kidney disease, or unspecified chronic kidney disease: Secondary | ICD-10-CM | POA: Diagnosis not present

## 2020-08-08 DIAGNOSIS — E785 Hyperlipidemia, unspecified: Secondary | ICD-10-CM | POA: Insufficient documentation

## 2020-08-08 DIAGNOSIS — Z87891 Personal history of nicotine dependence: Secondary | ICD-10-CM | POA: Insufficient documentation

## 2020-08-08 DIAGNOSIS — Z888 Allergy status to other drugs, medicaments and biological substances status: Secondary | ICD-10-CM | POA: Insufficient documentation

## 2020-08-08 DIAGNOSIS — F419 Anxiety disorder, unspecified: Secondary | ICD-10-CM | POA: Insufficient documentation

## 2020-08-08 DIAGNOSIS — E279 Disorder of adrenal gland, unspecified: Secondary | ICD-10-CM | POA: Insufficient documentation

## 2020-08-08 DIAGNOSIS — Z79899 Other long term (current) drug therapy: Secondary | ICD-10-CM | POA: Insufficient documentation

## 2020-08-08 DIAGNOSIS — R160 Hepatomegaly, not elsewhere classified: Secondary | ICD-10-CM

## 2020-08-08 DIAGNOSIS — Z955 Presence of coronary angioplasty implant and graft: Secondary | ICD-10-CM | POA: Insufficient documentation

## 2020-08-08 DIAGNOSIS — N189 Chronic kidney disease, unspecified: Secondary | ICD-10-CM | POA: Insufficient documentation

## 2020-08-08 DIAGNOSIS — Z7984 Long term (current) use of oral hypoglycemic drugs: Secondary | ICD-10-CM | POA: Diagnosis not present

## 2020-08-08 DIAGNOSIS — I480 Paroxysmal atrial fibrillation: Secondary | ICD-10-CM | POA: Diagnosis not present

## 2020-08-08 DIAGNOSIS — Z91040 Latex allergy status: Secondary | ICD-10-CM | POA: Diagnosis not present

## 2020-08-08 DIAGNOSIS — K439 Ventral hernia without obstruction or gangrene: Secondary | ICD-10-CM | POA: Insufficient documentation

## 2020-08-08 DIAGNOSIS — K8020 Calculus of gallbladder without cholecystitis without obstruction: Secondary | ICD-10-CM | POA: Insufficient documentation

## 2020-08-08 DIAGNOSIS — Z91041 Radiographic dye allergy status: Secondary | ICD-10-CM | POA: Diagnosis not present

## 2020-08-08 DIAGNOSIS — Z7901 Long term (current) use of anticoagulants: Secondary | ICD-10-CM | POA: Insufficient documentation

## 2020-08-08 DIAGNOSIS — I2510 Atherosclerotic heart disease of native coronary artery without angina pectoris: Secondary | ICD-10-CM | POA: Diagnosis not present

## 2020-08-08 DIAGNOSIS — C220 Liver cell carcinoma: Secondary | ICD-10-CM | POA: Insufficient documentation

## 2020-08-08 DIAGNOSIS — E1122 Type 2 diabetes mellitus with diabetic chronic kidney disease: Secondary | ICD-10-CM | POA: Diagnosis not present

## 2020-08-08 DIAGNOSIS — K769 Liver disease, unspecified: Secondary | ICD-10-CM | POA: Diagnosis not present

## 2020-08-08 DIAGNOSIS — Z882 Allergy status to sulfonamides status: Secondary | ICD-10-CM | POA: Insufficient documentation

## 2020-08-08 LAB — CBC
HCT: 40.4 % (ref 36.0–46.0)
Hemoglobin: 12.4 g/dL (ref 12.0–15.0)
MCH: 26.7 pg (ref 26.0–34.0)
MCHC: 30.7 g/dL (ref 30.0–36.0)
MCV: 86.9 fL (ref 80.0–100.0)
Platelets: 287 10*3/uL (ref 150–400)
RBC: 4.65 MIL/uL (ref 3.87–5.11)
RDW: 15.7 % — ABNORMAL HIGH (ref 11.5–15.5)
WBC: 10.3 10*3/uL (ref 4.0–10.5)
nRBC: 0 % (ref 0.0–0.2)

## 2020-08-08 LAB — PROTIME-INR
INR: 1 (ref 0.8–1.2)
Prothrombin Time: 12.9 seconds (ref 11.4–15.2)

## 2020-08-08 LAB — GLUCOSE, CAPILLARY: Glucose-Capillary: 191 mg/dL — ABNORMAL HIGH (ref 70–99)

## 2020-08-08 MED ORDER — LIDOCAINE-EPINEPHRINE (PF) 2 %-1:200000 IJ SOLN
INTRAMUSCULAR | Status: AC
  Filled 2020-08-08: qty 20

## 2020-08-08 MED ORDER — FENTANYL CITRATE (PF) 100 MCG/2ML IJ SOLN
INTRAMUSCULAR | Status: AC
  Filled 2020-08-08: qty 2

## 2020-08-08 MED ORDER — GELATIN ABSORBABLE 12-7 MM EX MISC
CUTANEOUS | Status: AC
  Filled 2020-08-08: qty 1

## 2020-08-08 MED ORDER — SODIUM CHLORIDE 0.9 % IV SOLN: INTRAVENOUS | Status: DC

## 2020-08-08 MED ORDER — FENTANYL CITRATE (PF) 100 MCG/2ML IJ SOLN
INTRAMUSCULAR | Status: AC | PRN
  Administered 2020-08-08 (×4): 25 ug via INTRAVENOUS

## 2020-08-08 NOTE — Discharge Instructions (Signed)
Interventional radiology phone numbers [phone] After hours [phone]  Liver Biopsy, Care After These instructions give you information on caring for yourself after your procedure. Your doctor may also give you more specific instructions. Call your doctor if you have any problems or questions after your procedure. What can I expect after the procedure? After the procedure, it is common to have: Pain and soreness where the biopsy was done. Bruising around the area where the biopsy was done. Sleepiness and be tired for a few days. Follow these instructions at home: Medicines Take over-the-counter and prescription medicines only as told by your doctor. If you were prescribed an antibiotic medicine, take it as told by your doctor. Do not stop taking the antibiotic even if you start to feel better. Do not take medicines such as aspirin and ibuprofen. These medicines can thin your blood. Do not take these medicines unless your doctor tells you to take them. If you are taking prescription pain medicine, take actions to prevent or treat constipation. Your doctor may recommend that you: Drink enough fluid to keep your pee (urine) clear or pale yellow. Take over-the-counter or prescription medicines. Eat foods that are high in fiber, such as fresh fruits and vegetables, whole grains, and beans. Limit foods that are high in fat and processed sugars, such as fried and sweet foods. Caring for your cut Follow instructions from your doctor about how to take care of your cuts from surgery (incisions). Make sure you: Wash your hands with soap and water before you change your bandage (dressing). If you cannot use soap and water, use hand sanitizer. Change your bandage as told by your doctor. Check your cuts every day for signs of infection. Check for: Redness, swelling, or more pain. Fluid or blood. Pus or a bad smell. Warmth. Do not take baths, swim, or use a hot tub until your doctor says it is  okay to do so. You may remove your dressing tomorrow and shower. Activity Rest at home for 1-2 days or as told by your doctor. Avoid sitting for a long time without moving. Get up to take short walks every 1-2 hours. Return to your normal activities as told by your doctor. Ask what activities are safe for you. Do not do these things in the first 24 hours: Drive. Use machinery. Take a bath or shower. Do not lift more than 10 pounds (4.5 kg) or play contact sports for the first 2 weeks.   General instructions Do not drink alcohol in the first week after the procedure. Have someone stay with you for at least 24 hours after the procedure. Get your test results. Ask your doctor or the department that is doing the test: When will my results be ready? How will I get my results? What are my treatment options? What other tests do I need? What are my next steps? Keep all follow-up visits as told by your doctor. This is important.   Contact a doctor if: A cut bleeds and leaves more than just a small spot of blood. A cut is red, puffs up (swells), or hurts more than before. Fluid or something else comes from a cut. A cut smells bad. You have a fever or chills. Get help right away if: You have swelling, bloating, or pain in your belly (abdomen). You get dizzy or faint. You have a rash. You feel sick to your stomach (nauseous) or throw up (vomit). You have trouble breathing, feel short of breath, or feel faint. Your chest   hurts. You have problems talking or seeing. You have trouble with your balance or moving your arms or legs. Summary After the procedure, it is common to have pain, soreness, bruising, and tiredness. Your doctor will tell you how to take care of yourself at home. Change your bandage, take your medicines, and limit your activities as told by your doctor. Call your doctor if you have symptoms of infection. Get help right away if your belly swells, your cut bleeds a lot, or you  have trouble talking or breathing. This information is not intended to replace advice given to you by your health care provider. Make sure you discuss any questions you have with your health care provider. Document Revised: 02/06/2017 Document Reviewed: 02/07/2017 [employer] Patient Education  2021 [employer]     Moderate Conscious Sedation, Adult, Care After This sheet gives you information about how to care for yourself after your procedure. Your health care provider may also give you more specific instructions. If you have problems or questions, contact your health care provider. What can I expect after the procedure? After the procedure, it is common to have: Sleepiness for several hours. Impaired judgment for several hours. Difficulty with balance. Vomiting if you eat too soon. Follow these instructions at home: For the time period you were told by your health care provider: Rest. Do not participate in activities where you could fall or become injured. Do not drive or use machinery. Do not drink alcohol. Do not take sleeping pills or medicines that cause drowsiness. Do not make important decisions or sign legal documents. Do not take care of children on your own.     Eating and drinking Follow the diet recommended by your health care provider. Drink enough fluid to keep your urine pale yellow. If you vomit: Drink water, juice, or soup when you can drink without vomiting. Make sure you have little or no nausea before eating solid foods.   General instructions Take over-the-counter and prescription medicines only as told by your health care provider. Have a responsible adult stay with you for the time you are told. It is important to have someone help care for you until you are awake and alert. Do not smoke. Keep all follow-up visits as told by your health care provider. This is important. Contact a health care provider if: You are still sleepy or having trouble with  balance after 24 hours. You feel light-headed. You keep feeling nauseous or you keep vomiting. You develop a rash. You have a fever. You have redness or swelling around the IV site. Get help right away if: You have trouble breathing. You have new-onset confusion at home. Summary After the procedure, it is common to feel sleepy, have impaired judgment, or feel nauseous if you eat too soon. Rest after you get home. Know the things you should not do after the procedure. Follow the diet recommended by your health care provider and drink enough fluid to keep your urine pale yellow. Get help right away if you have trouble breathing or new-onset confusion at home. This information is not intended to replace advice given to you by your health care provider. Make sure you discuss any questions you have with your health care provider. Document Revised: 05/28/2019 Document Reviewed: 12/24/2018 [employer] Patient Education  2021 [employer]  

## 2020-08-08 NOTE — Consult Note (Signed)
Chief Complaint: Patient was seen in consultation today for image guided liver lesion biopsy  Referring Physician(s): Sherrill,Gary B  Supervising Physician: Sandi Mariscal  Patient Status: [facility] - Out-pt  History of Present Illness: Stacy [name] is a 87 y.o. 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  She continues to have some right-sided abdominal pain as well as intermittent nausea and vomiting ,weight loss and back pain.  Lab work on 07/10/2020 showed elevated liver enzymes and she was referred for CT of the abdomen and pelvis.  No acute findings were noted but there were low-attenuation liver lesions which were incompletely characterized without IV contrast.  Subsequent abdominal ultrasound revealed multiple masses in the liver, cholelithiasis but no cholecystitis.  MRI of the abdomen performed on 07/31/2020 revealed:   Diffuse hepatic involvement by innumerable hypovascular masses, strongly favoring diffuse hepatic metastases over multifocal hepatocellular carcinoma. Suggest correlation with tumor markers.   1.5 cm left adrenal mass has nonspecific characteristics. Adrenal metastasis cannot be excluded.   Cholelithiasis. No radiographic evidence of cholecystitis or biliary ductal dilatation.   Small paraumbilical ventral hernia which contains only fat  CA 19-9, AFP and CEA were all normal.Request now received for image guided liver lesion biopsy for further evaluation. Past Medical History:  Diagnosis Date   Anxiety    Hyperlipidemia     Past Surgical History:  Procedure Laterality Date   3 epidural steroid  injections     APPENDECTOMY     Basal Cell Cancer Removal     forehead   CATARACT EXTRACTION     CATARACT EXTRACTION  12/29/2003   CORONARY ANGIOPLASTY  03-15-1995   CORONARY ANGIOPLASTY WITH STENT PLACEMENT  05-19-1998   CORONARY STENT PLACEMENT  2000   DILATION AND CURETTAGE OF UTERUS     ENDARTERECTOMY Right 04/13/2015   Procedure: ENDARTERECTOMY CAROTID;  Surgeon: Serafina Mitchell, MD;  Location: [place];  Service: Vascular;  Laterality: Right;   ENDOMETRIAL BIOPSY     EYE SURGERY     heart catherization     HEMORRHOID SURGERY     INCISIONAL HERNIA REPAIR     INGUINAL HERNIA REPAIR     LOOP RECORDER INSERTION N/A 06/24/2016   Procedure: Loop Recorder Insertion;  Surgeon: Deboraha Sprang, MD;  Location: [facility];  Service: Cardiovascular;  Laterality: N/A;   PATCH ANGIOPLASTY Right 04/13/2015   Procedure: PATCH ANGIOPLASTY;  Surgeon: Serafina Mitchell, MD;  Location: [facility] OR;  Service: Vascular;  Laterality: Right;   PTCA     SQUAMOUS CELL CARCINOMA EXCISION     left lower leg   TEE WITHOUT CARDIOVERSION N/A 06/24/2016   Procedure: TRANSESOPHAGEAL ECHOCARDIOGRAM (TEE);  Surgeon: Dorothy Spark, MD;  Location: [facility] ENDOSCOPY;  Service: Cardiovascular;  Laterality: N/A;   TONSILLECTOMY      Allergies: Iodinated diagnostic agents, Iohexol, Sulfonamide derivatives, Latex, Tranilast, Metformin hcl, Other, Tape, Wound dressing adhesive, and Sulfa antibiotics  Medications: Prior to Admission medications   Medication Sig Start Date End Date Taking? Authorizing Provider  ACCU-CHEK SMARTVIEW test strip 1 each by Other route as needed for other (blood sugar test strips).  03/29/14   [provider]  acetaminophen-codeine (TYLENOL #3)  300-30 MG tablet Take 2 tablets by mouth every 4 (four) hours as needed for moderate pain. 02/24/20   Lillard Anes, MD  cetirizine (ZYRTEC) 10 MG tablet TAKE 1 TABLET BY MOUTH EVERY DAY 01/19/20   Lillard Anes, MD  Cholecalciferol  (VITAMIN D-3) 25 MCG (1000 UT) CAPS Take 1,000 Units by mouth 2 (two) times daily.    [provider]  conjugated estrogens (PREMARIN) vaginal cream Place 1 g vaginally once a week. .625 mg once weekly    [provider]  diphenhydrAMINE (BENADRYL) 25 MG tablet Take 25 mg by mouth every 6 (six) hours as needed.    [provider]  docusate sodium (COLACE) 100 MG capsule Take 100 mg by mouth 2 (two) times daily. As needed    [provider]  ELIQUIS 5 MG TABS tablet TAKE 1 TABLET BY MOUTH TWICE A DAY 09/14/19   Sherren Mocha, MD  empagliflozin (JARDIANCE) 10 MG TABS tablet 1 tablet 03/23/20   [provider]  Famotidine-Ca Carb-Mag Hydrox (PEPCID COMPLETE PO) Take by mouth.    [provider]  ipratropium (ATROVENT) 0.06 % nasal spray As needed    [provider]  linaclotide (LINZESS) 72 MCG capsule Take 72 mcg by mouth daily. As needed    [provider]  Melatonin 10 MG TABS Take 10 mg by mouth as needed.    [provider]  metFORMIN (GLUCOPHAGE) 500 MG tablet Take 500 mg by mouth daily with breakfast. Taking BID    [provider]  metoprolol succinate (TOPROL XL) 25 MG 24 hr tablet Take 1 tablet (25 mg total) by mouth in the morning and at bedtime. 07/26/20 07/21/21  Sherren Mocha, MD  mometasone (ELOCON) 0.1 % cream mometasone 0.1 % topical cream  APPLY TO AFFECTED AREA EVERY DAY AS NEEDED ITCHING    [provider]  NEOMYCIN-POLYMYXIN-HYDROCORTISONE (CORTISPORIN) 1 % SOLN otic solution Place 2 drops into both ears as directed. 01/27/14   [provider]  nitroGLYCERIN (NITROSTAT) 0.4 MG SL tablet Place 1 tablet (0.4 mg total) under the tongue every 5 (five) minutes as needed for chest pain. 08/19/18   Sherren Mocha, MD  omeprazole (PRILOSEC OTC) 20 MG tablet Take 20 mg by mouth daily as needed (reflux).    [provider]  ondansetron (ZOFRAN ODT) 4 MG disintegrating tablet Take  1 tablet (4 mg total) by mouth every 8 (eight) hours as needed for nausea or vomiting. 07/12/20   Lillard Anes, MD  rosuvastatin (CRESTOR) 10 MG tablet TAKE 1 TABLET BY MOUTH EVERY DAY 08/18/19   Sherren Mocha, MD  sertraline (ZOLOFT) 50 MG tablet TAKE 1 TABLET BY MOUTH EVERY DAY 03/17/20   Lillard Anes, MD  vitamin B-12 (CYANOCOBALAMIN) 1000 MCG tablet Take 1,000 mcg by mouth daily.    [provider]     Family History  Problem Relation Age of Onset   Diabetes Father    Prostate cancer Father        meta to colon   Cancer Father        Prostate   Parkinsonism Mother    Stroke Paternal Uncle    Hypertension Paternal Uncle    Heart attack Neg Hx     Social History   Socioeconomic History   Marital status: Widowed    Spouse name: Not on file   Number of children: 1   Years of education: Not on file   Highest education level: Not on  file  Occupational History   Occupation: Retired    Fish farm manager: RETIRED    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  Tobacco Use   Smoking status: Former    Packs/day: 1.00    Years: 54.00    Pack years: 54.00    Types: Cigarettes    Quit date: 10/05/1998    Years since quitting: 21.8   Smokeless tobacco: Never  Vaping Use   Vaping Use: Never used  Substance and Sexual Activity   Alcohol use: Not Currently    Alcohol/week: 0.0 standard drinks   Drug use: Never   Sexual activity: Not Currently  Other Topics Concern   Not on file  Social History Narrative   Not on file   Social Determinants of Health   Financial Resource Strain: Not on file  Food Insecurity: Not on file  Transportation Needs: Not on file  Physical Activity: Not on file  Stress: Not on file  Social Connections: Not on file      Review of Systems see above, denies fever, headache, chest pain, dyspnea, cough, or visible bleeding; mild hearing difficulty  Vital Signs: BP (P) 121/71   Pulse (P) 72   Temp (P) 97.7 F (36.5 C) (Oral)   Resp (P) 18   SpO2  (P) 96%   Physical Exam awake, alert.  Chest clear to auscultation bilaterally; heart with normal rate, irregular rhythm.  Abdomen soft, few bowel sounds, mild right upper abdominal/lateral abdominal tenderness to palpation.  No lower extremity edema.  Imaging: CT ABDOMEN PELVIS WO CONTRAST  Result Date: 07/10/2020 CLINICAL DATA:  87 year old female with history of nausea and vomiting. Suspected bowel obstruction. EXAM: CT ABDOMEN AND PELVIS WITHOUT CONTRAST TECHNIQUE: Multidetector CT imaging of the abdomen and pelvis was performed following the standard protocol without IV contrast. COMPARISON:  CT the abdomen and pelvis 11/26/2005. FINDINGS: Lower chest: Mild fibrotic changes in the visualize lung bases. Atherosclerotic calcifications in the descending thoracic aorta as well as the right coronary artery. Hepatobiliary: Low-attenuation lesions in the liver, incompletely characterized on today's non-contrast CT examination, but statistically likely to represent small cysts. No definite suspicious hepatic lesions are confidently identified on today's noncontrast CT examination. Calcified granulomas are noted in the liver. Calcified gallstones lying dependently in the gallbladder measuring up to 11 mm. Pancreas: No definite pancreatic mass or peripancreatic fluid collections or inflammatory changes are noted on today's noncontrast CT examination. Spleen: Unremarkable. Adrenals/Urinary Tract: Unenhanced appearance of the kidneys and bilateral adrenal glands are normal. No hydroureteronephrosis. Urinary bladder is normal in appearance. Stomach/Bowel: Unenhanced appearance of the stomach is normal. No pathologic dilatation of small bowel or colon. The appendix is not confidently identified and may be surgically absent. Regardless, there are no inflammatory changes noted adjacent to the cecum to suggest the presence of an acute appendicitis at this time. Vascular/Lymphatic: Aortic atherosclerosis. No  lymphadenopathy noted in the abdomen or pelvis. Reproductive: Unenhanced appearance of uterus and ovaries is unremarkable. Other: Small umbilical hernia containing only omental fat. No significant volume of ascites. No pneumoperitoneum. Musculoskeletal: There are no aggressive appearing lytic or blastic lesions noted in the visualized portions of the skeleton. IMPRESSION: 1. No acute findings are noted in the abdomen or pelvis to account for the patient's symptoms. 2. Small umbilical hernia containing only omental fat. No associated bowel incarceration or obstruction at this time. 3. Cholelithiasis without evidence of acute cholecystitis at this time. 4. Aortic atherosclerosis, as well as right coronary artery disease. 5. Additional incidental findings, as above. Electronically Signed  By: Vinnie Langton M.D.   On: 07/10/2020 13:42   MR Abdomen W Wo Contrast  Result Date: 07/31/2020 CLINICAL DATA:  Abdominal pain.  Liver masses on recent ultrasound. EXAM: MRI ABDOMEN WITHOUT AND WITH CONTRAST TECHNIQUE: Multiplanar multisequence MR imaging of the abdomen was performed both before and after the administration of intravenous contrast. CONTRAST:  107mL MULTIHANCE GADOBENATE DIMEGLUMINE 529 MG/ML IV SOLN COMPARISON:  Abdomen ultrasound on 07/10/2020 FINDINGS: Lower chest: No acute findings. Hepatobiliary: Diffuse hepatic involvement by innumerable hypovascular masses is seen, which measure up to 4 cm in size. The liver has a nodular capsular contour which may be seen with cirrhosis, however the hypovascular nature of these masses strongly favors diffuse hepatic metastases over multifocal hepatocellular carcinoma. Several small benign hepatic cysts are also noted. Several small less than 1 cm gallstones are seen, however there is no evidence of cholecystitis or biliary ductal dilatation. Pancreas: No mass or inflammatory changes. No evidence of pancreatic ductal dilatation. Spleen:  Within normal limits in size and  appearance. Adrenals/Urinary Tract: A 1.5 cm left adrenal mass is seen which has nonspecific characteristics. Adrenal metastasis cannot be excluded. A few tiny sub-cm renal cysts are noted. No renal masses identified. No evidence of hydronephrosis. Stomach/Bowel: Visualized portion unremarkable. Vascular/Lymphatic: No pathologically enlarged lymph nodes identified. No acute vascular findings. Other: Small paraumbilical ventral hernia is seen which contains only fat. Musculoskeletal: Compression fracture of the L1 vertebral body is noted, however this appears benign. No suspicious bone lesions identified. IMPRESSION: Diffuse hepatic involvement by innumerable hypovascular masses, strongly favoring diffuse hepatic metastases over multifocal hepatocellular carcinoma. Suggest correlation with tumor markers. 1.5 cm left adrenal mass has nonspecific characteristics. Adrenal metastasis cannot be excluded. Cholelithiasis. No radiographic evidence of cholecystitis or biliary ductal dilatation. Small paraumbilical ventral hernia which contains only fat. Electronically Signed   By: Marlaine Hind M.D.   On: 07/31/2020 11:08   US PELVIS LIMITED (TRANSABDOMINAL ONLY)  Result Date: 07/19/2020 CLINICAL DATA:  Right lower quadrant abdominal pain EXAM: ULTRASOUND OF RIGHT GROIN SOFT TISSUES TECHNIQUE: Ultrasound examination of the groin soft tissues was performed in the area of clinical concern. COMPARISON:  None. FINDINGS: Limited grayscale sonography was performed in the right inguinal region in the area of focal tenderness as indicated by the patient. These images demonstrate no abnormal subcutaneous soft tissue mass, fluid collection, or calcification. There is no pathologic adenopathy identified. Imaging performed with Valsalva maneuver demonstrates no inducible inguinal hernia. IMPRESSION: Unremarkable right groin sonogram. No sonographic correlate for the patient's reported focal tenderness. Electronically Signed   By:  Fidela Salisbury MD   On: 07/19/2020 23:39   US Abdomen Limited RUQ (LIVER/GB)  Result Date: 07/10/2020 CLINICAL DATA:  Pain EXAM: ULTRASOUND ABDOMEN LIMITED RIGHT UPPER QUADRANT COMPARISON:  None. FINDINGS: Gallbladder: There are multiple gallstones noted. Normal gallbladder wall thickness. No pericholecystic cystic fluid. No sonographic Murphy sign noted by sonographer. Common bile duct: Diameter: 2 mm, normal Liver: There are multiple incompletely assessed masses in the liver. There is a hypoechoic solid mass in the LEFT liver which measures 2.9 by 2.0 x 2.9 cm. There is a hypoechoic mass along the margin of the RIGHT liver which measures 2.9 x 2.9 x 2.3 cm. There is a simple cyst near the porta hepatic which measures 18 x 13 x 17 mm. Portal vein is patent on color Doppler imaging with normal direction of blood flow towards the liver. Other: None. IMPRESSION: 1. Cholelithiasis without evidence of cholecystitis. 2. There are innumerable incompletely assessed  hypoechoic masses in the liver. Recommend further evaluation with a dedicated liver MRI with and without contrast. Electronically Signed   By: Valentino Saxon MD   On: 07/10/2020 14:48    Labs:  CBC: Recent Labs    12/02/19 1620 02/22/20 1058 06/20/20 1107 07/10/20 1327  WBC 10.1 8.5 9.1 9.1  HGB 14.1 13.8 13.4 13.7  HCT 42.8 43.3 43.6 44.8  PLT 216 212 231 260    COAGS: No results for input(s): INR, APTT in the last 8760 hours.  BMP: Recent Labs    08/31/19 1000 12/02/19 1620 02/22/20 1058 06/20/20 1107 07/10/20 1327  NA 139 141 137 139 137  K 3.8 3.5 4.5 4.3 4.2  CL 100 99 100 100 102  CO2 24 22 24 23 24   GLUCOSE 159* 133* 178* 141* 155*  BUN 11 12 9* 10 15  CALCIUM 9.2 9.4 9.5 9.4 9.3  CREATININE 1.05* 0.95 1.04* 1.09* 1.33*  GFRNONAA 47* 53* 47*  --  38*  GFRAA 54* 61 54*  --   --     LIVER FUNCTION TESTS: Recent Labs    12/02/19 1620 02/22/20 1058 06/20/20 1107 07/10/20 1327  BILITOT 0.4 0.9 1.0 1.0   AST 30 48* 85* 94*  ALT 23 23 38* 41  ALKPHOS 80 89 203* 207*  PROT 6.7 6.4 6.3 6.7  ALBUMIN 4.3 4.0 3.7 3.1*    TUMOR MARKERS: Recent Labs    08/02/20 1455  CEA 2.45    Assessment and Plan: 87 y.o. 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  She continues to have some right-sided abdominal pain as well as intermittent nausea and vomiting ,weight loss and back pain.  Lab work on 07/10/2020 showed elevated liver enzymes and she was referred for CT of the abdomen and pelvis.  No acute findings were noted but there were low-attenuation liver lesions which were incompletely characterized without IV contrast.  Subsequent abdominal ultrasound revealed multiple masses in the liver, cholelithiasis but no cholecystitis.  MRI of the abdomen performed on 07/31/2020 revealed:   Diffuse hepatic involvement by innumerable hypovascular masses, strongly favoring diffuse hepatic metastases over multifocal hepatocellular carcinoma. Suggest correlation with tumor markers.   1.5 cm left adrenal mass has nonspecific characteristics. Adrenal metastasis cannot be excluded.   Cholelithiasis. No radiographic evidence of cholecystitis or biliary ductal dilatation.   Small paraumbilical ventral hernia which contains only fat  CA 19-9, AFP and CEA were all normal.Request now received for image guided liver lesion biopsy for further evaluation.Risks and benefits of procedure was discussed with the patient /daughter including, but not limited to bleeding, infection, damage to adjacent structures or low yield requiring additional tests.  All of the  questions were answered and there is agreement to proceed.  Consent signed and in chart.    Thank you for this interesting consult.  I greatly enjoyed meeting ADASHA BOEHME and look forward to participating in their care.  A copy of this report was sent to the requesting provider on this date.  Electronically Signed: D. Rowe Robert, PA-C 08/08/2020, 12:10 PM   I spent a total of  25 minutes   in face to face in clinical consultation,  greater than 50% of which was counseling/coordinating care for image guided liver lesion biopsy

## 2020-08-08 NOTE — Procedures (Signed)
Pre Procedure Dx: Liver lesion Post Procedural Dx: Same  Technically successful US guided biopsy of infiltrative lesion within the subcapsular aspect of the right lobe of the liver.   EBL: None  No immediate complications.   Ronny Bacon, MD Pager #: [phone]

## 2020-08-10 ENCOUNTER — Ambulatory Visit: Payer: Medicare Other | Admitting: Legal Medicine

## 2020-08-10 LAB — SURGICAL PATHOLOGY

## 2020-08-11 ENCOUNTER — Inpatient Hospital Stay: Payer: Medicare Other | Attending: Nurse Practitioner | Admitting: Nurse Practitioner

## 2020-08-11 ENCOUNTER — Other Ambulatory Visit: Payer: Self-pay

## 2020-08-11 ENCOUNTER — Encounter: Payer: Self-pay | Admitting: Nurse Practitioner

## 2020-08-11 VITALS — BP 115/64 | HR 76 | Temp 98.2°F | Resp 18 | Ht 62.0 in | Wt 142.6 lb

## 2020-08-11 DIAGNOSIS — R630 Anorexia: Secondary | ICD-10-CM | POA: Diagnosis not present

## 2020-08-11 DIAGNOSIS — I4891 Unspecified atrial fibrillation: Secondary | ICD-10-CM | POA: Diagnosis not present

## 2020-08-11 DIAGNOSIS — Z7189 Other specified counseling: Secondary | ICD-10-CM | POA: Diagnosis not present

## 2020-08-11 DIAGNOSIS — C220 Liver cell carcinoma: Secondary | ICD-10-CM | POA: Diagnosis not present

## 2020-08-11 DIAGNOSIS — R634 Abnormal weight loss: Secondary | ICD-10-CM | POA: Diagnosis not present

## 2020-08-11 DIAGNOSIS — R5383 Other fatigue: Secondary | ICD-10-CM

## 2020-08-11 DIAGNOSIS — Z85828 Personal history of other malignant neoplasm of skin: Secondary | ICD-10-CM | POA: Diagnosis not present

## 2020-08-11 DIAGNOSIS — Z7901 Long term (current) use of anticoagulants: Secondary | ICD-10-CM | POA: Insufficient documentation

## 2020-08-11 DIAGNOSIS — I2510 Atherosclerotic heart disease of native coronary artery without angina pectoris: Secondary | ICD-10-CM | POA: Diagnosis not present

## 2020-08-11 DIAGNOSIS — B370 Candidal stomatitis: Secondary | ICD-10-CM | POA: Insufficient documentation

## 2020-08-11 DIAGNOSIS — Z5112 Encounter for antineoplastic immunotherapy: Secondary | ICD-10-CM | POA: Insufficient documentation

## 2020-08-11 DIAGNOSIS — N390 Urinary tract infection, site not specified: Secondary | ICD-10-CM | POA: Diagnosis not present

## 2020-08-11 DIAGNOSIS — E119 Type 2 diabetes mellitus without complications: Secondary | ICD-10-CM | POA: Diagnosis not present

## 2020-08-11 MED ORDER — TRAMADOL HCL 50 MG PO TABS
25.0000 mg | ORAL_TABLET | Freq: Two times a day (BID) | ORAL | 0 refills | Status: AC | PRN
Start: 2020-08-11 — End: ?

## 2020-08-11 NOTE — Progress Notes (Signed)
[place] OFFICE PROGRESS NOTE   Diagnosis: Hepatocellular carcinoma  INTERVAL HISTORY:   Ms. Arkin returns as scheduled.  She underwent biopsy of a liver lesion 08/08/2020.  The following day she complained of generalized pruritus to her daughter.  Her daughter subsequently noted an erythematous rash mainly over the trunk.  She has been applying hydrocortisone cream and giving her Benadryl.  The rash and pruritus have improved.  Ms. Rochette reports position related right-sided abdominal pain.  She attributes the pain to the biopsy as she was not experiencing pain prior to the procedure.  She is taking Tylenol as needed but wonders if she could have something stronger.  Objective:  Vital signs in last 24 hours:  Blood pressure 115/64, pulse 76, temperature 98.2 F (36.8 C), temperature source Oral, resp. rate 18, height 5\' 2"  (1.575 m), weight 142 lb 9.6 oz (64.7 kg), SpO2 98 %.   Resp: Faint rales at both lung bases. Cardio: Irregular. GI: Fullness right mid to upper abdomen. Vascular: No leg edema. Skin: A few erythematous skin lesions along the abdominal wall.   Lab Results:  Lab Results  Component Value Date   WBC 10.3 08/08/2020   HGB 12.4 08/08/2020   HCT 40.4 08/08/2020   MCV 86.9 08/08/2020   PLT 287 08/08/2020   NEUTROABS 6.0 07/10/2020    Imaging:  No results found.  Medications: I have reviewed the patient's current medications.  Assessment/Plan: Multiple liver masses CT abdomen/pelvis 07/11/2018, low-attenuation liver lesions incompletely characterized without contrast Ultrasound abdomen 07/10/2020-innumerable incompletely assessed hypoechoic masses in the liver MRI abdomen 07/29/2020- innumerable hypervascular liver masses, nodular liver contour, 1.5 cm left adrenal mass Biopsy liver lesion 08/08/2020-hepatocellular carcinoma, moderately differentiated Coronary artery disease Atrial fibrillation-maintained on anticoagulation History of basal  cell and squamous cell skin cancers Diabetes Anorexia/weight loss Abdominal pain  Disposition: Ms. Loughridge appears stable.  She appears to have multifocal hepatocellular carcinoma.  Diagnosis, prognosis and treatment options reviewed with her and her daughter at today's appointment.  They understand that no therapy will be curative.  Dr. Benay Spice reviewed various systemic treatment options with her as well as supportive care.  She would like to try treatment.  Dr. Benay Spice recommends Avastin/atezolizumab on a 3-week schedule.  We reviewed potential toxicities associated with Avastin including bleeding, delayed wound healing, hypertension, bowel perforation, proteinuria, increased risk of blood clot/stroke, allergic reaction, posterior reversible encephalopathy syndrome.  We discussed potential toxicities associated with atezolizumab including rash, diarrhea, arthritis, neuropathy, pneumonitis, hepatitis, endocrinopathies.  She was provided with written information as well.  She agrees to proceed.  She will attend a chemotherapy education class.  We will obtain baseline labs when she is here for the education class.  She will return for cycle 1 Avastin/atezolizumab 08/17/2020.  We will see her in follow-up prior to cycle 2.  Patient seen with Dr. Benay Spice.  Ned Card ANP/GNP-BC   08/11/2020  2:38 PM  This was a shared visit with Ned Card.  We discussed the her biopsy pathology with Ms. Dohmen and her daughter.  She does not have known risk factors for [facility], but the clinical presentation is consistent with this diagnosis.  A nodular liver contour was seen on the staging MRI.  We discussed treatment options.  She does not appear to be a candidate for surgery or hepatic directed therapy.  We discussed comfort care versus a trial of systemic therapy.  I recommend treatment with atezolizumab and bevacizumab.  We reviewed potential toxicities associated with both of  these systemic therapy agents.  She agrees  to proceed.  A treatment plan was entered today.  I was present for greater than 50% of today's visit.  I performed medical decision making.  Julieanne Manson, MD

## 2020-08-12 ENCOUNTER — Encounter: Payer: Self-pay | Admitting: Oncology

## 2020-08-12 DIAGNOSIS — C220 Liver cell carcinoma: Secondary | ICD-10-CM | POA: Insufficient documentation

## 2020-08-12 DIAGNOSIS — Z7189 Other specified counseling: Secondary | ICD-10-CM | POA: Insufficient documentation

## 2020-08-16 ENCOUNTER — Inpatient Hospital Stay: Payer: Medicare Other

## 2020-08-16 ENCOUNTER — Ambulatory Visit (HOSPITAL_COMMUNITY): Payer: Medicare Other | Attending: Cardiovascular Disease

## 2020-08-16 ENCOUNTER — Other Ambulatory Visit: Payer: Self-pay

## 2020-08-16 ENCOUNTER — Telehealth: Payer: Self-pay | Admitting: *Deleted

## 2020-08-16 DIAGNOSIS — C220 Liver cell carcinoma: Secondary | ICD-10-CM

## 2020-08-16 DIAGNOSIS — R5383 Other fatigue: Secondary | ICD-10-CM

## 2020-08-16 DIAGNOSIS — I480 Paroxysmal atrial fibrillation: Secondary | ICD-10-CM

## 2020-08-16 LAB — CBC WITH DIFFERENTIAL (CANCER CENTER ONLY)
Abs Immature Granulocytes: 0.03 10*3/uL (ref 0.00–0.07)
Basophils Absolute: 0.1 10*3/uL (ref 0.0–0.1)
Basophils Relative: 1 %
Eosinophils Absolute: 0.2 10*3/uL (ref 0.0–0.5)
Eosinophils Relative: 2 %
HCT: 41.3 % (ref 36.0–46.0)
Hemoglobin: 12.7 g/dL (ref 12.0–15.0)
Immature Granulocytes: 0 %
Lymphocytes Relative: 22 %
Lymphs Abs: 2.1 10*3/uL (ref 0.7–4.0)
MCH: 26.6 pg (ref 26.0–34.0)
MCHC: 30.8 g/dL (ref 30.0–36.0)
MCV: 86.4 fL (ref 80.0–100.0)
Monocytes Absolute: 0.7 10*3/uL (ref 0.1–1.0)
Monocytes Relative: 7 %
Neutro Abs: 6.8 10*3/uL (ref 1.7–7.7)
Neutrophils Relative %: 68 %
Platelet Count: 486 10*3/uL — ABNORMAL HIGH (ref 150–400)
RBC: 4.78 MIL/uL (ref 3.87–5.11)
RDW: 15.8 % — ABNORMAL HIGH (ref 11.5–15.5)
WBC Count: 9.8 10*3/uL (ref 4.0–10.5)
nRBC: 0 % (ref 0.0–0.2)

## 2020-08-16 LAB — CMP (CANCER CENTER ONLY)
ALT: 130 U/L — ABNORMAL HIGH (ref 0–44)
AST: 222 U/L (ref 15–41)
Albumin: 3.1 g/dL — ABNORMAL LOW (ref 3.5–5.0)
Alkaline Phosphatase: 436 U/L — ABNORMAL HIGH (ref 38–126)
Anion gap: 8 (ref 5–15)
BUN: 14 mg/dL (ref 8–23)
CO2: 26 mmol/L (ref 22–32)
Calcium: 9.8 mg/dL (ref 8.9–10.3)
Chloride: 103 mmol/L (ref 98–111)
Creatinine: 1.08 mg/dL — ABNORMAL HIGH (ref 0.44–1.00)
GFR, Estimated: 48 mL/min — ABNORMAL LOW (ref >60)
Glucose, Bld: 145 mg/dL — ABNORMAL HIGH (ref 70–99)
Potassium: 4.9 mmol/L (ref 3.5–5.1)
Sodium: 137 mmol/L (ref 135–145)
Total Bilirubin: 0.7 mg/dL (ref 0.3–1.2)
Total Protein: 6.9 g/dL (ref 6.5–8.1)

## 2020-08-16 LAB — HEPATITIS C ANTIBODY: HCV Ab: NONREACTIVE

## 2020-08-16 LAB — ECHOCARDIOGRAM COMPLETE
Area-P 1/2: 3.64 cm2
S' Lateral: 3.1 cm

## 2020-08-16 LAB — HEPATITIS B CORE ANTIBODY, TOTAL: Hep B Core Total Ab: NONREACTIVE

## 2020-08-16 LAB — TSH: TSH: 2.802 u[IU]/mL (ref 0.350–4.500)

## 2020-08-16 NOTE — Telephone Encounter (Signed)
Notified daughter that it is OK to see dentist as planned on 08/28/20 per Dr. Benay Spice. Also informed her of high AST 222, which MD has seen and is due to her cancer. Should improve with treatment.

## 2020-08-16 NOTE — Telephone Encounter (Signed)
CRITICAL VALUE STICKER  CRITICAL VALUE: AST [id number]  RECEIVER (on-site recipient of call):Ovidio Steele,RN  DATE & TIME NOTIFIED: 08/16/20 @ 62  MESSENGER (representative from lab):Phyllis  MD NOTIFIED: Dr. Benay Spice   TIME OF NOTIFICATION: 1400  RESPONSE: Not unexpected due to liver cancer.

## 2020-08-17 ENCOUNTER — Telehealth: Payer: Self-pay | Admitting: Oncology

## 2020-08-17 NOTE — Progress Notes (Signed)
Pharmacist Chemotherapy Monitoring - Initial Assessment    Anticipated start date: 08/18/20   The following has been reviewed per standard work regarding the patient's treatment regimen: The patient's diagnosis, treatment plan and drug doses, and organ/hematologic function Lab orders and baseline tests specific to treatment regimen  The treatment plan start date, drug sequencing, and pre-medications Prior authorization status  Patient's documented medication list, including drug-drug interaction screen and prescriptions for anti-emetics and supportive care specific to the treatment regimen The drug concentrations, fluid compatibility, administration routes, and timing of the medications to be used The patient's access for treatment and lifetime cumulative dose history, if applicable  The patient's medication allergies and previous infusion related reactions, if applicable   Changes made to treatment plan:  N/A  Follow up needed:  N/A   Stacy [name], [facility], 08/17/2020  12:42 PM

## 2020-08-17 NOTE — Telephone Encounter (Signed)
Scheduled appt per 7/6 sch msg. Pt's daughter is aware.

## 2020-08-18 ENCOUNTER — Other Ambulatory Visit: Payer: Self-pay

## 2020-08-18 ENCOUNTER — Inpatient Hospital Stay: Payer: Medicare Other

## 2020-08-18 VITALS — BP 111/58 | HR 66 | Temp 98.2°F | Resp 18

## 2020-08-18 DIAGNOSIS — B370 Candidal stomatitis: Secondary | ICD-10-CM | POA: Diagnosis not present

## 2020-08-18 DIAGNOSIS — R630 Anorexia: Secondary | ICD-10-CM | POA: Diagnosis not present

## 2020-08-18 DIAGNOSIS — Z5112 Encounter for antineoplastic immunotherapy: Secondary | ICD-10-CM | POA: Diagnosis not present

## 2020-08-18 DIAGNOSIS — R634 Abnormal weight loss: Secondary | ICD-10-CM | POA: Diagnosis not present

## 2020-08-18 DIAGNOSIS — E119 Type 2 diabetes mellitus without complications: Secondary | ICD-10-CM | POA: Diagnosis not present

## 2020-08-18 DIAGNOSIS — C220 Liver cell carcinoma: Secondary | ICD-10-CM

## 2020-08-18 MED ORDER — SODIUM CHLORIDE 0.9 % IV SOLN
15.0000 mg/kg | Freq: Once | INTRAVENOUS | Status: AC
  Administered 2020-08-18: 1000 mg via INTRAVENOUS
  Filled 2020-08-18: qty 32

## 2020-08-18 MED ORDER — SODIUM CHLORIDE 0.9 % IV SOLN
1200.0000 mg | Freq: Once | INTRAVENOUS | Status: AC
  Administered 2020-08-18: 1200 mg via INTRAVENOUS
  Filled 2020-08-18: qty 20

## 2020-08-18 MED ORDER — SODIUM CHLORIDE 0.9 % IV SOLN
Freq: Once | INTRAVENOUS | Status: AC
  Filled 2020-08-18: qty 250

## 2020-08-18 NOTE — Patient Instructions (Signed)
[facility]  Discharge Instructions: Thank you for choosing [facility] to provide your oncology and hematology care.   If you have a lab appointment with the [facility], please go directly to the [facility] and check in at the registration area.   Wear comfortable clothing and clothing appropriate for easy access to any Portacath or PICC line.   We strive to give you quality time with your provider. You may need to reschedule your appointment if you arrive late (15 or more minutes).  Arriving late affects you and other patients whose appointments are after yours.  Also, if you miss three or more appointments without notifying the office, you may be dismissed from the clinic at the provider's discretion.      For prescription refill requests, have your pharmacy contact our office and allow 72 hours for refills to be completed.    Today you received the following chemotherapy and/or immunotherapy agents AVASTIN and TECENTRIQ      To help prevent nausea and vomiting after your treatment, we encourage you to take your nausea medication as directed.  BELOW ARE SYMPTOMS THAT SHOULD BE REPORTED IMMEDIATELY: *FEVER GREATER THAN 100.4 F (38 C) OR HIGHER *CHILLS OR SWEATING *NAUSEA AND VOMITING THAT IS NOT CONTROLLED WITH YOUR NAUSEA MEDICATION *UNUSUAL SHORTNESS OF BREATH *UNUSUAL BRUISING OR BLEEDING *URINARY PROBLEMS (pain or burning when urinating, or frequent urination) *BOWEL PROBLEMS (unusual diarrhea, constipation, pain near the anus) TENDERNESS IN MOUTH AND THROAT WITH OR WITHOUT PRESENCE OF ULCERS (sore throat, sores in mouth, or a toothache) UNUSUAL RASH, SWELLING OR PAIN  UNUSUAL VAGINAL DISCHARGE OR ITCHING   Items with * indicate a potential emergency and should be followed up as soon as possible or go to the Emergency Department if any problems should occur.  Please show the CHEMOTHERAPY ALERT CARD or IMMUNOTHERAPY ALERT CARD at  check-in to the Emergency Department and triage nurse.  Should you have questions after your visit or need to cancel or reschedule your appointment, please contact [facility]  Dept: [phone]  and follow the prompts.  Office hours are 8:00 a.m. to 4:30 p.m. Monday - Friday. Please note that voicemails left after 4:00 p.m. may not be returned until the following business day.  We are closed weekends and major holidays. You have access to a nurse at all times for urgent questions. Please call the main number to the clinic Dept: [phone] and follow the prompts.   For any non-urgent questions, you may also contact your provider using MyChart. We now offer e-Visits for anyone 56 and older to request care online for non-urgent symptoms. For details visit [email or web address].   Also download the MyChart app! Go to the app store, search "MyChart", open the app, select [facility], and log in with your MyChart username and password.  Due to Covid, a mask is required upon entering the hospital/clinic. If you do not have a mask, one will be given to you upon arrival. For doctor visits, patients may have 1 support person aged 48 or older with them. For treatment visits, patients cannot have anyone with them due to current Covid guidelines and our immunocompromised population.    Bevacizumab injection What is this medication? BEVACIZUMAB (be va SIZ yoo mab) is a monoclonal antibody. It is used to treatmany types of cancer. This medicine may be used for other purposes; ask your health care provider orpharmacist if you have questions. COMMON BRAND NAME(S): Avastin,  MVASI, Noah Charon What should I tell my care team before I take this medication? They need to know if you have any of these conditions: diabetes heart disease high blood pressure history of coughing up blood prior anthracycline chemotherapy (e.g., doxorubicin, daunorubicin, epirubicin) recent or ongoing  radiation therapy recent or planning to have surgery stroke an unusual or allergic reaction to bevacizumab, hamster proteins, mouse proteins, other medicines, foods, dyes, or preservatives pregnant or trying to get pregnant breast-feeding How should I use this medication? This medicine is for infusion into a vein. It is given by a health careprofessional in a hospital or clinic setting. Talk to your pediatrician regarding the use of this medicine in children.Special care may be needed. Overdosage: If you think you have taken too much of this medicine contact apoison control center or emergency room at once. NOTE: This medicine is only for you. Do not share this medicine with others. What if I miss a dose? It is important not to miss your dose. Call your doctor or health careprofessional if you are unable to keep an appointment. What may interact with this medication? Interactions are not expected. This list may not describe all possible interactions. Give your health care provider a list of all the medicines, herbs, non-prescription drugs, or dietary supplements you use. Also tell them if you smoke, drink alcohol, or use illegaldrugs. Some items may interact with your medicine. What should I watch for while using this medication? Your condition will be monitored carefully while you are receiving this medicine. You will need important blood work and urine testing done while youare taking this medicine. This medicine may increase your risk to bruise or bleed. Call your doctor orhealth care professional if you notice any unusual bleeding. Before having surgery, talk to your health care provider to make sure it is ok. This drug can increase the risk of poor healing of your surgical site or wound. You will need to stop this drug for 28 days before surgery. After surgery, wait at least 28 days before restarting this drug. Make sure the surgical site or wound is healed enough before restarting this drug.  Talk to your health careprovider if questions. Do not become pregnant while taking this medicine or for 6 months after stopping it. Women should inform their doctor if they wish to become pregnant or think they might be pregnant. There is a potential for serious side effects to an unborn child. Talk to your health care professional or pharmacist for more information. Do not breast-feed an infant while taking this medicine andfor 6 months after the last dose. This medicine has caused ovarian failure in some women. This medicine may interfere with the ability to have a child. You should talk to your doctor orhealth care professional if you are concerned about your fertility. What side effects may I notice from receiving this medication? Side effects that you should report to your doctor or health care professionalas soon as possible: allergic reactions like skin rash, itching or hives, swelling of the face, lips, or tongue chest pain or chest tightness chills coughing up blood high fever seizures severe constipation signs and symptoms of bleeding such as bloody or black, tarry stools; red or dark-Rhody urine; spitting up blood or Kocher material that looks like coffee grounds; red spots on the skin; unusual bruising or bleeding from the eye, gums, or nose signs and symptoms of a blood clot such as breathing problems; chest pain; severe, sudden headache; pain, swelling, warmth in the leg  signs and symptoms of a stroke like changes in vision; confusion; trouble speaking or understanding; severe headaches; sudden numbness or weakness of the face, arm or leg; trouble walking; dizziness; loss of balance or coordination stomach pain sweating swelling of legs or ankles vomiting weight gain Side effects that usually do not require medical attention (report to yourdoctor or health care professional if they continue or are bothersome): back pain changes in taste decreased appetite dry  skin nausea tiredness This list may not describe all possible side effects. Call your doctor for medical advice about side effects. You may report side effects to FDA at1-800-FDA-1088. Where should I keep my medication? This drug is given in a hospital or clinic and will not be stored at home. NOTE: This sheet is a summary. It may not cover all possible information. If you have questions about this medicine, talk to your doctor, pharmacist, orhealth care provider.  2022 Elsevier/Gold Standard (2018-11-25 10:50:46)  Atezolizumab injection What is this medication? ATEZOLIZUMAB (a te zoe LIZ ue mab) is a monoclonal antibody. It is used to treat bladder cancer (urothelial cancer), liver cancer, lung cancer, andmelanoma. This medicine may be used for other purposes; ask your health care provider orpharmacist if you have questions. COMMON BRAND NAME(S): Tecentriq What should I tell my care team before I take this medication? They need to know if you have any of these conditions: autoimmune diseases like Crohn's disease, ulcerative colitis, or lupus have had or planning to have an allogeneic stem cell transplant (uses someone else's stem cells) history of organ transplant history of radiation to the chest nervous system problems like myasthenia gravis or Guillain-Barre syndrome an unusual or allergic reaction to atezolizumab, other medicines, foods, dyes, or preservatives pregnant or trying to get pregnant breast-feeding How should I use this medication? This medicine is for infusion into a vein. It is given by a health careprofessional in a hospital or clinic setting. A special MedGuide will be given to you before each treatment. Be sure to readthis information carefully each time. Talk to your pediatrician regarding the use of this medicine in children.Special care may be needed. Overdosage: If you think you have taken too much of this medicine contact apoison control center or emergency room  at once. NOTE: This medicine is only for you. Do not share this medicine with others. What if I miss a dose? It is important not to miss your dose. Call your doctor or health careprofessional if you are unable to keep an appointment. What may interact with this medication? Interactions have not been studied. This list may not describe all possible interactions. Give your health care provider a list of all the medicines, herbs, non-prescription drugs, or dietary supplements you use. Also tell them if you smoke, drink alcohol, or use illegaldrugs. Some items may interact with your medicine. What should I watch for while using this medication? Your condition will be monitored carefully while you are receiving thismedicine. You may need blood work done while you are taking this medicine. Do not become pregnant while taking this medicine or for at least 5 months after stopping it. Women should inform their doctor if they wish to become pregnant or think they might be pregnant. There is a potential for serious side effects to an unborn child. Talk to your health care professional or pharmacist for more information. Do not breast-feed an infant while taking this medicineor for at least 5 months after the last dose. What side effects may I notice from receiving  this medication? Side effects that you should report to your doctor or health care professionalas soon as possible: allergic reactions like skin rash, itching or hives, swelling of the face, lips, or tongue black, tarry stools bloody or watery diarrhea breathing problems changes in vision chest pain or chest tightness chills facial flushing fever headache signs and symptoms of high blood sugar such as dizziness; dry mouth; dry skin; fruity breath; nausea; stomach pain; increased hunger or thirst; increased urination signs and symptoms of liver injury like dark yellow or brown urine; general ill feeling or flu-like symptoms; light-colored  stools; loss of appetite; nausea; right upper belly pain; unusually weak or tired; yellowing of the eyes or skin stomach pain trouble passing urine or change in the amount of urine Side effects that usually do not require medical attention (report to yourdoctor or health care professional if they continue or are bothersome): bone pain cough diarrhea joint pain muscle pain muscle weakness swelling of arms or legs tiredness weight loss This list may not describe all possible side effects. Call your doctor for medical advice about side effects. You may report side effects to FDA at1-800-FDA-1088. Where should I keep my medication? This drug is given in a hospital or clinic and will not be stored at home. NOTE: This sheet is a summary. It may not cover all possible information. If you have questions about this medicine, talk to your doctor, pharmacist, orhealth care provider.  2022 Elsevier/Gold Standard (2019-10-28 13:59:34)

## 2020-08-18 NOTE — Progress Notes (Signed)
Per Ned Card CRNP ok to proceed with treatment with AST of 222 and ALT of 130.. Will get urine protein with cycle 2.

## 2020-08-21 ENCOUNTER — Telehealth: Payer: Self-pay

## 2020-08-21 NOTE — Telephone Encounter (Signed)
24 Hr. Call Back  Telephone call to patient's home phone and mobile phone post First Time Tecentriq/Avastin treatment, unable to reach patient but left a voice message with call back number for any questions or concerns.

## 2020-08-24 ENCOUNTER — Telehealth: Payer: Self-pay

## 2020-08-24 ENCOUNTER — Encounter: Payer: Medicare Other | Admitting: Nutrition

## 2020-08-24 NOTE — Telephone Encounter (Signed)
Daughter called to state pt is having issues with constipation.  She would like to give her Rolan Lipa if that is ok with you.  If you don't want her taking that what should she take

## 2020-08-24 NOTE — Telephone Encounter (Signed)
Spoke with daughter and stated that Dr Learta Codding suggests trying over the counter meds first such as miralax and/or senekot. Verbalized understanding.

## 2020-08-25 ENCOUNTER — Other Ambulatory Visit: Payer: Self-pay | Admitting: Cardiovascular Disease

## 2020-08-25 DIAGNOSIS — E785 Hyperlipidemia, unspecified: Secondary | ICD-10-CM

## 2020-08-25 DIAGNOSIS — I2510 Atherosclerotic heart disease of native coronary artery without angina pectoris: Secondary | ICD-10-CM

## 2020-08-29 ENCOUNTER — Telehealth: Payer: Self-pay

## 2020-08-29 NOTE — Telephone Encounter (Signed)
TC from Pt's daughter stating she was having burning while urinating. Informed Pt's daughter to increase Pt's fluids and we will have her come in for a urinalysis just to make sure she doesn't have an infection. Scheduling message sent.

## 2020-08-30 ENCOUNTER — Inpatient Hospital Stay: Payer: Medicare Other

## 2020-08-30 ENCOUNTER — Other Ambulatory Visit: Payer: Self-pay

## 2020-08-30 DIAGNOSIS — C220 Liver cell carcinoma: Secondary | ICD-10-CM | POA: Diagnosis not present

## 2020-08-30 DIAGNOSIS — R300 Dysuria: Secondary | ICD-10-CM

## 2020-08-30 DIAGNOSIS — R630 Anorexia: Secondary | ICD-10-CM | POA: Diagnosis not present

## 2020-08-30 DIAGNOSIS — R634 Abnormal weight loss: Secondary | ICD-10-CM | POA: Diagnosis not present

## 2020-08-30 DIAGNOSIS — Z5112 Encounter for antineoplastic immunotherapy: Secondary | ICD-10-CM | POA: Diagnosis not present

## 2020-08-30 DIAGNOSIS — B370 Candidal stomatitis: Secondary | ICD-10-CM | POA: Diagnosis not present

## 2020-08-30 DIAGNOSIS — E119 Type 2 diabetes mellitus without complications: Secondary | ICD-10-CM | POA: Diagnosis not present

## 2020-08-30 LAB — URINALYSIS, COMPLETE (UACMP) WITH MICROSCOPIC
Glucose, UA: 100 mg/dL — AB
Ketones, ur: NEGATIVE mg/dL
Nitrite: NEGATIVE
Protein, ur: 30 mg/dL — AB
Specific Gravity, Urine: 1.01 (ref 1.005–1.030)
WBC, UA: >50 WBC/hpf (ref 0–5)
pH: 6.5 (ref 5.0–8.0)

## 2020-08-31 ENCOUNTER — Other Ambulatory Visit: Payer: Self-pay | Admitting: Nurse Practitioner

## 2020-08-31 ENCOUNTER — Telehealth: Payer: Self-pay

## 2020-08-31 DIAGNOSIS — N390 Urinary tract infection, site not specified: Secondary | ICD-10-CM

## 2020-08-31 DIAGNOSIS — C220 Liver cell carcinoma: Secondary | ICD-10-CM

## 2020-08-31 MED ORDER — CIPROFLOXACIN HCL 250 MG PO TABS: 250.0000 mg | ORAL_TABLET | Freq: Two times a day (BID) | ORAL | 0 refills | Status: AC

## 2020-08-31 NOTE — Telephone Encounter (Signed)
VM message left on Pt's daughter's phone to inform her that Pt does have an infection and antibiotic was called into the pharmacy.

## 2020-09-02 ENCOUNTER — Other Ambulatory Visit: Payer: Self-pay | Admitting: Oncology

## 2020-09-02 LAB — URINE CULTURE: Culture: >=100000 — AB

## 2020-09-07 ENCOUNTER — Inpatient Hospital Stay: Payer: Medicare Other

## 2020-09-07 ENCOUNTER — Other Ambulatory Visit: Payer: Self-pay

## 2020-09-07 ENCOUNTER — Inpatient Hospital Stay (HOSPITAL_BASED_OUTPATIENT_CLINIC_OR_DEPARTMENT_OTHER): Payer: Medicare Other | Admitting: Oncology

## 2020-09-07 VITALS — HR 85

## 2020-09-07 VITALS — BP 126/63 | HR 101 | Temp 97.8°F | Resp 19 | Ht 62.0 in | Wt 131.6 lb

## 2020-09-07 DIAGNOSIS — R630 Anorexia: Secondary | ICD-10-CM | POA: Diagnosis not present

## 2020-09-07 DIAGNOSIS — R5383 Other fatigue: Secondary | ICD-10-CM | POA: Diagnosis not present

## 2020-09-07 DIAGNOSIS — C220 Liver cell carcinoma: Secondary | ICD-10-CM

## 2020-09-07 DIAGNOSIS — R634 Abnormal weight loss: Secondary | ICD-10-CM | POA: Diagnosis not present

## 2020-09-07 DIAGNOSIS — Z5112 Encounter for antineoplastic immunotherapy: Secondary | ICD-10-CM | POA: Diagnosis not present

## 2020-09-07 DIAGNOSIS — B370 Candidal stomatitis: Secondary | ICD-10-CM | POA: Diagnosis not present

## 2020-09-07 DIAGNOSIS — E119 Type 2 diabetes mellitus without complications: Secondary | ICD-10-CM | POA: Diagnosis not present

## 2020-09-07 LAB — CMP (CANCER CENTER ONLY)
ALT: 88 U/L — ABNORMAL HIGH (ref 0–44)
AST: 126 U/L — ABNORMAL HIGH (ref 15–41)
Albumin: 2.9 g/dL — ABNORMAL LOW (ref 3.5–5.0)
Alkaline Phosphatase: 210 U/L — ABNORMAL HIGH (ref 38–126)
Anion gap: 10 (ref 5–15)
BUN: 15 mg/dL (ref 8–23)
CO2: 23 mmol/L (ref 22–32)
Calcium: 9 mg/dL (ref 8.9–10.3)
Chloride: 103 mmol/L (ref 98–111)
Creatinine: 1.09 mg/dL — ABNORMAL HIGH (ref 0.44–1.00)
GFR, Estimated: 48 mL/min — ABNORMAL LOW (ref >60)
Glucose, Bld: 179 mg/dL — ABNORMAL HIGH (ref 70–99)
Potassium: 4 mmol/L (ref 3.5–5.1)
Sodium: 136 mmol/L (ref 135–145)
Total Bilirubin: 0.9 mg/dL (ref 0.3–1.2)
Total Protein: 6.3 g/dL — ABNORMAL LOW (ref 6.5–8.1)

## 2020-09-07 LAB — CBC WITH DIFFERENTIAL (CANCER CENTER ONLY)
Abs Immature Granulocytes: 0.06 10*3/uL (ref 0.00–0.07)
Basophils Absolute: 0.1 10*3/uL (ref 0.0–0.1)
Basophils Relative: 1 %
Eosinophils Absolute: 0.2 10*3/uL (ref 0.0–0.5)
Eosinophils Relative: 1 %
HCT: 45.9 % (ref 36.0–46.0)
Hemoglobin: 14 g/dL (ref 12.0–15.0)
Immature Granulocytes: 1 %
Lymphocytes Relative: 24 %
Lymphs Abs: 2.9 10*3/uL (ref 0.7–4.0)
MCH: 25.5 pg — ABNORMAL LOW (ref 26.0–34.0)
MCHC: 30.5 g/dL (ref 30.0–36.0)
MCV: 83.6 fL (ref 80.0–100.0)
Monocytes Absolute: 1 10*3/uL (ref 0.1–1.0)
Monocytes Relative: 8 %
Neutro Abs: 7.8 10*3/uL — ABNORMAL HIGH (ref 1.7–7.7)
Neutrophils Relative %: 65 %
Platelet Count: 514 10*3/uL — ABNORMAL HIGH (ref 150–400)
RBC: 5.49 MIL/uL — ABNORMAL HIGH (ref 3.87–5.11)
RDW: 16 % — ABNORMAL HIGH (ref 11.5–15.5)
WBC Count: 12 10*3/uL — ABNORMAL HIGH (ref 4.0–10.5)
nRBC: 0 % (ref 0.0–0.2)

## 2020-09-07 MED ORDER — SODIUM CHLORIDE 0.9 % IV SOLN
1200.0000 mg | Freq: Once | INTRAVENOUS | Status: AC
  Administered 2020-09-07: 1200 mg via INTRAVENOUS
  Filled 2020-09-07: qty 20

## 2020-09-07 MED ORDER — FLUCONAZOLE 50 MG PO TABS: 50.0000 mg | ORAL_TABLET | Freq: Every day | ORAL | 0 refills | Status: AC

## 2020-09-07 MED ORDER — SODIUM CHLORIDE 0.9 % IV SOLN
900.0000 mg | Freq: Once | INTRAVENOUS | Status: AC
  Administered 2020-09-07: 900 mg via INTRAVENOUS
  Filled 2020-09-07: qty 32

## 2020-09-07 MED ORDER — SODIUM CHLORIDE 0.9 % IV SOLN
Freq: Once | INTRAVENOUS | Status: AC
  Filled 2020-09-07: qty 250

## 2020-09-07 NOTE — Progress Notes (Signed)
Per Dr. Benay Spice, ok to treat with AST 126, ALT 88, and urine protein from 08/30/20.

## 2020-09-07 NOTE — Patient Instructions (Signed)
[facility]  Discharge Instructions: Thank you for choosing [facility] to provide your oncology and hematology care.   If you have a lab appointment with the [facility], please go directly to the [facility] and check in at the registration area.   Wear comfortable clothing and clothing appropriate for easy access to any Portacath or PICC line.   We strive to give you quality time with your provider. You may need to reschedule your appointment if you arrive late (15 or more minutes).  Arriving late affects you and other patients whose appointments are after yours.  Also, if you miss three or more appointments without notifying the office, you may be dismissed from the clinic at the provider's discretion.      For prescription refill requests, have your pharmacy contact our office and allow 72 hours for refills to be completed.    Today you received the following chemotherapy and/or immunotherapy agents AVASTIN and TECENTRIQ      To help prevent nausea and vomiting after your treatment, we encourage you to take your nausea medication as directed.  BELOW ARE SYMPTOMS THAT SHOULD BE REPORTED IMMEDIATELY: *FEVER GREATER THAN 100.4 F (38 C) OR HIGHER *CHILLS OR SWEATING *NAUSEA AND VOMITING THAT IS NOT CONTROLLED WITH YOUR NAUSEA MEDICATION *UNUSUAL SHORTNESS OF BREATH *UNUSUAL BRUISING OR BLEEDING *URINARY PROBLEMS (pain or burning when urinating, or frequent urination) *BOWEL PROBLEMS (unusual diarrhea, constipation, pain near the anus) TENDERNESS IN MOUTH AND THROAT WITH OR WITHOUT PRESENCE OF ULCERS (sore throat, sores in mouth, or a toothache) UNUSUAL RASH, SWELLING OR PAIN  UNUSUAL VAGINAL DISCHARGE OR ITCHING   Items with * indicate a potential emergency and should be followed up as soon as possible or go to the Emergency Department if any problems should occur.  Please show the CHEMOTHERAPY ALERT CARD or IMMUNOTHERAPY ALERT CARD at  check-in to the Emergency Department and triage nurse.  Should you have questions after your visit or need to cancel or reschedule your appointment, please contact [facility]  Dept: [phone]  and follow the prompts.  Office hours are 8:00 a.m. to 4:30 p.m. Monday - Friday. Please note that voicemails left after 4:00 p.m. may not be returned until the following business day.  We are closed weekends and major holidays. You have access to a nurse at all times for urgent questions. Please call the main number to the clinic Dept: [phone] and follow the prompts.   For any non-urgent questions, you may also contact your provider using MyChart. We now offer e-Visits for anyone 19 and older to request care online for non-urgent symptoms. For details visit [email or web address].   Also download the MyChart app! Go to the app store, search "MyChart", open the app, select [facility], and log in with your MyChart username and password.  Due to Covid, a mask is required upon entering the hospital/clinic. If you do not have a mask, one will be given to you upon arrival. For doctor visits, patients may have 1 support person aged 64 or older with them. For treatment visits, patients cannot have anyone with them due to current Covid guidelines and our immunocompromised population.    Bevacizumab injection What is this medication? BEVACIZUMAB (be va SIZ yoo mab) is a monoclonal antibody. It is used to treatmany types of cancer. This medicine may be used for other purposes; ask your health care provider orpharmacist if you have questions. COMMON BRAND NAME(S): Avastin,  MVASI, Noah Charon What should I tell my care team before I take this medication? They need to know if you have any of these conditions: diabetes heart disease high blood pressure history of coughing up blood prior anthracycline chemotherapy (e.g., doxorubicin, daunorubicin, epirubicin) recent or ongoing  radiation therapy recent or planning to have surgery stroke an unusual or allergic reaction to bevacizumab, hamster proteins, mouse proteins, other medicines, foods, dyes, or preservatives pregnant or trying to get pregnant breast-feeding How should I use this medication? This medicine is for infusion into a vein. It is given by a health careprofessional in a hospital or clinic setting. Talk to your pediatrician regarding the use of this medicine in children.Special care may be needed. Overdosage: If you think you have taken too much of this medicine contact apoison control center or emergency room at once. NOTE: This medicine is only for you. Do not share this medicine with others. What if I miss a dose? It is important not to miss your dose. Call your doctor or health careprofessional if you are unable to keep an appointment. What may interact with this medication? Interactions are not expected. This list may not describe all possible interactions. Give your health care provider a list of all the medicines, herbs, non-prescription drugs, or dietary supplements you use. Also tell them if you smoke, drink alcohol, or use illegaldrugs. Some items may interact with your medicine. What should I watch for while using this medication? Your condition will be monitored carefully while you are receiving this medicine. You will need important blood work and urine testing done while youare taking this medicine. This medicine may increase your risk to bruise or bleed. Call your doctor orhealth care professional if you notice any unusual bleeding. Before having surgery, talk to your health care provider to make sure it is ok. This drug can increase the risk of poor healing of your surgical site or wound. You will need to stop this drug for 28 days before surgery. After surgery, wait at least 28 days before restarting this drug. Make sure the surgical site or wound is healed enough before restarting this drug.  Talk to your health careprovider if questions. Do not become pregnant while taking this medicine or for 6 months after stopping it. Women should inform their doctor if they wish to become pregnant or think they might be pregnant. There is a potential for serious side effects to an unborn child. Talk to your health care professional or pharmacist for more information. Do not breast-feed an infant while taking this medicine andfor 6 months after the last dose. This medicine has caused ovarian failure in some women. This medicine may interfere with the ability to have a child. You should talk to your doctor orhealth care professional if you are concerned about your fertility. What side effects may I notice from receiving this medication? Side effects that you should report to your doctor or health care professionalas soon as possible: allergic reactions like skin rash, itching or hives, swelling of the face, lips, or tongue chest pain or chest tightness chills coughing up blood high fever seizures severe constipation signs and symptoms of bleeding such as bloody or black, tarry stools; red or dark-Rhody urine; spitting up blood or Kocher material that looks like coffee grounds; red spots on the skin; unusual bruising or bleeding from the eye, gums, or nose signs and symptoms of a blood clot such as breathing problems; chest pain; severe, sudden headache; pain, swelling, warmth in the leg  signs and symptoms of a stroke like changes in vision; confusion; trouble speaking or understanding; severe headaches; sudden numbness or weakness of the face, arm or leg; trouble walking; dizziness; loss of balance or coordination stomach pain sweating swelling of legs or ankles vomiting weight gain Side effects that usually do not require medical attention (report to yourdoctor or health care professional if they continue or are bothersome): back pain changes in taste decreased appetite dry  skin nausea tiredness This list may not describe all possible side effects. Call your doctor for medical advice about side effects. You may report side effects to FDA at1-800-FDA-1088. Where should I keep my medication? This drug is given in a hospital or clinic and will not be stored at home. NOTE: This sheet is a summary. It may not cover all possible information. If you have questions about this medicine, talk to your doctor, pharmacist, orhealth care provider.  2022 Elsevier/Gold Standard (2018-11-25 10:50:46)  Atezolizumab injection What is this medication? ATEZOLIZUMAB (a te zoe LIZ ue mab) is a monoclonal antibody. It is used to treat bladder cancer (urothelial cancer), liver cancer, lung cancer, andmelanoma. This medicine may be used for other purposes; ask your health care provider orpharmacist if you have questions. COMMON BRAND NAME(S): Tecentriq What should I tell my care team before I take this medication? They need to know if you have any of these conditions: autoimmune diseases like Crohn's disease, ulcerative colitis, or lupus have had or planning to have an allogeneic stem cell transplant (uses someone else's stem cells) history of organ transplant history of radiation to the chest nervous system problems like myasthenia gravis or Guillain-Barre syndrome an unusual or allergic reaction to atezolizumab, other medicines, foods, dyes, or preservatives pregnant or trying to get pregnant breast-feeding How should I use this medication? This medicine is for infusion into a vein. It is given by a health careprofessional in a hospital or clinic setting. A special MedGuide will be given to you before each treatment. Be sure to readthis information carefully each time. Talk to your pediatrician regarding the use of this medicine in children.Special care may be needed. Overdosage: If you think you have taken too much of this medicine contact apoison control center or emergency room  at once. NOTE: This medicine is only for you. Do not share this medicine with others. What if I miss a dose? It is important not to miss your dose. Call your doctor or health careprofessional if you are unable to keep an appointment. What may interact with this medication? Interactions have not been studied. This list may not describe all possible interactions. Give your health care provider a list of all the medicines, herbs, non-prescription drugs, or dietary supplements you use. Also tell them if you smoke, drink alcohol, or use illegaldrugs. Some items may interact with your medicine. What should I watch for while using this medication? Your condition will be monitored carefully while you are receiving thismedicine. You may need blood work done while you are taking this medicine. Do not become pregnant while taking this medicine or for at least 5 months after stopping it. Women should inform their doctor if they wish to become pregnant or think they might be pregnant. There is a potential for serious side effects to an unborn child. Talk to your health care professional or pharmacist for more information. Do not breast-feed an infant while taking this medicineor for at least 5 months after the last dose. What side effects may I notice from receiving  this medication? Side effects that you should report to your doctor or health care professionalas soon as possible: allergic reactions like skin rash, itching or hives, swelling of the face, lips, or tongue black, tarry stools bloody or watery diarrhea breathing problems changes in vision chest pain or chest tightness chills facial flushing fever headache signs and symptoms of high blood sugar such as dizziness; dry mouth; dry skin; fruity breath; nausea; stomach pain; increased hunger or thirst; increased urination signs and symptoms of liver injury like dark yellow or brown urine; general ill feeling or flu-like symptoms; light-colored  stools; loss of appetite; nausea; right upper belly pain; unusually weak or tired; yellowing of the eyes or skin stomach pain trouble passing urine or change in the amount of urine Side effects that usually do not require medical attention (report to yourdoctor or health care professional if they continue or are bothersome): bone pain cough diarrhea joint pain muscle pain muscle weakness swelling of arms or legs tiredness weight loss This list may not describe all possible side effects. Call your doctor for medical advice about side effects. You may report side effects to FDA at1-800-FDA-1088. Where should I keep my medication? This drug is given in a hospital or clinic and will not be stored at home. NOTE: This sheet is a summary. It may not cover all possible information. If you have questions about this medicine, talk to your doctor, pharmacist, orhealth care provider.  2022 Elsevier/Gold Standard (2019-10-28 13:59:34)

## 2020-09-07 NOTE — Progress Notes (Signed)
  [facility] OFFICE PROGRESS NOTE   Diagnosis: Hepatocellular carcinoma  INTERVAL HISTORY:   Stacy [name] completed cycle 1 atezolizumab/bevacizumab on 08/18/2020.  No rash, bleeding, symptom of thrombosis, or diarrhea.  She noted improvement in her energy level and abdominal discomfort following treatment.  She developed symptoms of a urinary tract infection on 08/29/2020.  A urine culture was positive for a Proteus infection.  She completed a course of ciprofloxacin.  The burning with urination resolved.  She developed increased anorexia when she had urinary tract infection.  This has improved.  She believes she has "thrush "in the mouth.  Objective:  Vital signs in last 24 hours:  Blood pressure 126/63, pulse (!) 101, temperature 97.8 F (36.6 C), temperature source Oral, resp. rate 19, height 5\' 2"  (1.575 m), weight 131 lb 9.6 oz (59.7 kg), SpO2 96 %.    HEENT: Mild white coat over the tongue, no buccal thrush Resp: Lungs clear bilaterally Cardio: Regular rate and rhythm GI: No hepatosplenomegaly, no mass, nontender Vascular: No leg edema  Lab Results:  Lab Results  Component Value Date   WBC 12.0 (H) 09/07/2020   HGB 14.0 09/07/2020   HCT 45.9 09/07/2020   MCV 83.6 09/07/2020   PLT 514 (H) 09/07/2020   NEUTROABS 7.8 (H) 09/07/2020    CMP  Lab Results  Component Value Date   NA 136 09/07/2020   K 4.0 09/07/2020   CL 103 09/07/2020   CO2 23 09/07/2020   GLUCOSE 179 (H) 09/07/2020   BUN 15 09/07/2020   CREATININE 1.09 (H) 09/07/2020   CALCIUM 9.0 09/07/2020   PROT 6.3 (L) 09/07/2020   ALBUMIN 2.9 (L) 09/07/2020   AST 126 (H) 09/07/2020   ALT 88 (H) 09/07/2020   ALKPHOS 210 (H) 09/07/2020   BILITOT 0.9 09/07/2020   GFRNONAA 48 (L) 09/07/2020   GFRAA 54 (L) 02/22/2020    Lab Results  Component Value Date   CEA 2.45 08/02/2020   RSW546 27 08/02/2020    Lab Results  Component Value Date   INR 1.0 08/08/2020   LABPROT 12.9 08/08/2020     Imaging:  No results found.  Medications: I have reviewed the patient's current medications.   Assessment/Plan:  Multiple liver masses CT abdomen/pelvis 07/11/2018, low-attenuation liver lesions incompletely characterized without contrast Ultrasound abdomen 07/10/2020-innumerable incompletely assessed hypoechoic masses in the liver MRI abdomen 07/29/2020- innumerable hypervascular liver masses, nodular liver contour, 1.5 cm left adrenal mass Biopsy liver lesion 08/08/2020-hepatocellular carcinoma, moderately differentiated Cycle 1 atezolizumab/bevacizumab 08/18/2020 Cycle 2 atezolizumab/bevacizumab 09/07/2020 Coronary artery disease Atrial fibrillation-maintained on anticoagulation History of basal cell and squamous cell skin cancers Diabetes Anorexia/weight loss Abdominal pain Urinary tract infection 08/30/2020   Disposition: Stacy [name] tolerated the first cycle of systemic therapy well.  She will complete cycle 2 today.  She had a urinary tract infection last week.  Her symptoms resolved with a course of ciprofloxacin.  She has mild thrush.  She will complete a course of Diflucan.  The plan is to begin an appetite stimulant if she loses more weight.  She will return for an office visit and cycle 3 atezolizumab/bevacizumab in 3 weeks.  [name], MD  09/07/2020  11:17 AM

## 2020-09-07 NOTE — Progress Notes (Signed)
Dose adjusted to 900 mg today for weight loss per MD

## 2020-09-12 NOTE — Progress Notes (Deleted)
Chronic Care Management Pharmacy Note  09/12/2020 Name:  Stacy [name] MRN:  [id number] DOB:  May 01, 1926  Summary: ***  Recommendations/Changes made from today's visit: ***  Plan: ***   Subjective: Stacy [name] is an 87 y.o. year old female who is a primary patient of Talerico, Zeb Comfort, MD.  The CCM team was consulted for assistance with disease management and care coordination needs.    Engaged with patient face to face for initial visit in response to provider referral for pharmacy case management and/or care coordination services.   Consent to Services:  The patient was given the following information about Chronic Care Management services today, agreed to services, and gave verbal consent: 1. CCM service includes personalized support from designated clinical staff supervised by the primary care provider, including individualized plan of care and coordination with other care providers 2. 24/7 contact phone numbers for assistance for urgent and routine care needs. 3. Service will only be billed when office clinical staff spend 20 minutes or more in a month to coordinate care. 4. Only one practitioner may furnish and bill the service in a calendar month. 5.The patient may stop CCM services at any time (effective at the end of the month) by phone call to the office staff. 6. The patient will be responsible for cost sharing (co-pay) of up to 20% of the service fee (after annual deductible is met). Patient agreed to services and consent obtained.  Patient Care Team: Lillard Anes, MD as PCP - General (Family Medicine) Sherren Mocha, MD as PCP - Cardiology (Cardiology) Deboraha Sprang, MD as PCP - Electrophysiology (Cardiology) Delrae Rend, MD as Consulting Physician (Endocrinology) Burnice Logan, [facility] as Pharmacist (Pharmacist)  Recent office visits: ***  Recent consult visits: [facility] visits: {Hospital DC Yes/No:25215}   Objective:  Lab Results   Component Value Date   CREATININE 1.09 (H) 09/07/2020   BUN 15 09/07/2020   GFR 56.18 (L) 01/16/2012   GFRNONAA 48 (L) 09/07/2020   GFRAA 54 (L) 02/22/2020   NA 136 09/07/2020   K 4.0 09/07/2020   CALCIUM 9.0 09/07/2020   CO2 23 09/07/2020   GLUCOSE 179 (H) 09/07/2020    Lab Results  Component Value Date/Time   HGBA1C 7.7 (H) 06/20/2020 11:07 AM   HGBA1C 9.5 (H) 02/22/2020 10:58 AM   GFR 56.18 (L) 01/16/2012 09:58 AM   MICROALBUR 10 06/22/2020 03:07 PM   MICROALBUR 10 04/21/2019 03:14 PM    Last diabetic Eye exam:  Lab Results  Component Value Date/Time   HMDIABEYEEXA No Retinopathy 11/17/2019 12:00 AM    Last diabetic Foot exam: No results found for: HMDIABFOOTEX   Lab Results  Component Value Date   CHOL 107 06/20/2020   HDL 41 06/20/2020   LDLCALC 47 06/20/2020   TRIG 104 06/20/2020   CHOLHDL 2.6 06/20/2020    Hepatic Function Latest Ref Rng & Units 09/07/2020 08/16/2020 07/10/2020  Total Protein 6.5 - 8.1 g/dL 6.3(L) 6.9 6.7  Albumin 3.5 - 5.0 g/dL 2.9(L) 3.1(L) 3.1(L)  AST 15 - 41 U/L 126(H) 222(HH) 94(H)  ALT 0 - 44 U/L 88(H) 130(H) 41  Alk Phosphatase 38 - 126 U/L 210(H) 436(H) 207(H)  Total Bilirubin 0.3 - 1.2 mg/dL 0.9 0.7 1.0  Bilirubin, Direct 0.0 - 0.3 mg/dL - - -    Lab Results  Component Value Date/Time   TSH 2.802 08/16/2020 01:08 PM   TSH 1.770 02/22/2020 10:58 AM   TSH 1.730 09/02/2019 03:20 PM  CBC Latest Ref Rng & Units 09/07/2020 08/16/2020 08/08/2020  WBC 4.0 - 10.5 K/uL 12.0(H) 9.8 10.3  Hemoglobin 12.0 - 15.0 g/dL 14.0 12.7 12.4  Hematocrit 36.0 - 46.0 % 45.9 41.3 40.4  Platelets 150 - 400 K/uL 514(H) 486(H) 287    Lab Results  Component Value Date/Time   VD25OH 49.5 02/22/2020 10:58 AM   VD25OH 44.6 09/02/2019 03:20 PM    Clinical ASCVD: Yes  The ASCVD Risk score Mikey Bussing DC Jr., et al., 2013) failed to calculate for the following reasons:   The 2013 ASCVD risk score is only valid for ages 59 to 55   The patient has a prior MI or  stroke diagnosis    Depression screen [facility] 2/9 06/22/2020 12/02/2019  Decreased Interest 0 0  Down, Depressed, Hopeless 2 0  PHQ - 2 Score 2 0  Altered sleeping 1 -  Tired, decreased energy 2 -  Change in appetite 0 -  Feeling bad or failure about yourself  0 -  Trouble concentrating 0 -  Moving slowly or fidgety/restless 0 -  Suicidal thoughts 0 -  PHQ-9 Score 5 -  Difficult doing work/chores Somewhat difficult -  Some recent data might be hidden     Other: (CHADS2VASc if Afib, MMRC or CAT for COPD, ACT, DEXA)  Social History   Tobacco Use  Smoking Status Former   Packs/day: 1.00   Years: 54.00   Pack years: 54.00   Types: Cigarettes   Quit date: 10/05/1998   Years since quitting: 21.9  Smokeless Tobacco Never   BP Readings from Last 3 Encounters:  09/07/20 126/63  08/18/20 (!) 111/58  08/11/20 115/64   Pulse Readings from Last 3 Encounters:  09/07/20 85  09/07/20 (!) 101  08/18/20 66   Wt Readings from Last 3 Encounters:  09/07/20 131 lb 9.6 oz (59.7 kg)  08/11/20 142 lb 9.6 oz (64.7 kg)  08/04/20 141 lb (64 kg)   BMI Readings from Last 3 Encounters:  09/07/20 24.07 kg/m  08/11/20 26.08 kg/m  08/04/20 25.79 kg/m    Assessment/Interventions: Review of patient past medical history, allergies, medications, health status, including review of consultants reports, laboratory and other test data, was performed as part of comprehensive evaluation and provision of chronic care management services.   SDOH:  (Social Determinants of Health) assessments and interventions performed: Yes  SDOH Screenings   Alcohol Screen: Not on file  Depression (PHQ2-9): Medium Risk   PHQ-2 Score: 5  Financial Resource Strain: Not on file  Food Insecurity: Not on file  Housing: Not on file  Physical Activity: Not on file  Social Connections: Not on file  Stress: Not on file  Tobacco Use: Medium Risk   Smoking Tobacco Use: Former   Smokeless Tobacco Use: Never  Transportation  Needs: Not on file    [place]  Allergies  Allergen Reactions   Iodinated Diagnostic Agents Anaphylaxis and Rash   Iohexol Other (See Comments)     Desc: HIVES- 13 HR PRE-MEDS ARE REQUIRED-ASM- 03/21/05,SULFA,ADHESIVE TAPE    Sulfonamide Derivatives Hives    REACTION: hives   Latex Other (See Comments)    Adhesive tape and EKG adhesive leads to rash   Tranilast Hives    Investigational medication   Metformin Hcl     Other reaction(s): diarrhea, anorexia (at higher doses)   Other     Other reaction(s): Unknown   Tape Other (See Comments)    Adhesive tap redness   Wound Dressing Adhesive  Other reaction(s): Unknown   Sulfa Antibiotics Rash    Medications Reviewed Today     Reviewed by Georgianne Fick, RN (Registered Nurse) on 09/07/20 at 1540  Med List Status: <None>   Medication Order Taking? Sig Documenting Provider Last Dose Status Informant  ACCU-CHEK SMARTVIEW test strip [id number] No 1 each by Other route as needed for other (blood sugar test strips).  [provider] Taking Active Multiple Informants           Med Note Jenel Lucks Mar 30, 2015  7:24 PM)    cetirizine (ZYRTEC) 10 MG tablet [id number] No TAKE 1 TABLET BY MOUTH EVERY DAY Lillard Anes, MD Taking Active   Cholecalciferol (VITAMIN D-3) 25 MCG (1000 UT) CAPS [id number] No Take 1,000 Units by mouth 2 (two) times daily. [provider] Taking Active   conjugated estrogens (PREMARIN) vaginal cream [id number] No Place 1 g vaginally once a week. .625 mg once weekly [provider] Taking Active Multiple Informants           Med Note Hinda Kehr   Fri Jan 01, 2019  9:14 AM)    diphenhydrAMINE (BENADRYL) 25 MG tablet [id number] No Take 25 mg by mouth every 6 (six) hours as needed. [provider] Taking Active   docusate sodium (COLACE) 100 MG capsule [id number] No Take 100 mg by mouth 2 (two) times daily. As needed [provider]  Taking Active   ELIQUIS 5 MG TABS tablet [id number] No TAKE 1 TABLET BY MOUTH TWICE A Lacie Scotts, MD Taking Active   empagliflozin (JARDIANCE) 10 MG TABS tablet [id number] No 1 tablet [provider] Taking Active   Famotidine-Ca Carb-Mag Hydrox (PEPCID COMPLETE PO) [id number] No Take by mouth. [provider] Taking Active   fluconazole (DIFLUCAN) 50 MG tablet [id number]  Take 1 tablet (50 mg total) by mouth daily for 4 days. Ladell Pier, MD  Active   ipratropium (ATROVENT) 0.06 % nasal spray [id number] No As needed [provider] Taking Active   linaclotide (LINZESS) 72 MCG capsule [id number] No Take 72 mcg by mouth daily. As needed [provider] Taking Active   Melatonin 10 MG TABS [id number] No Take 10 mg by mouth as needed. [provider] Taking Active   metFORMIN (GLUCOPHAGE) 500 MG tablet [id number] No Take 500 mg by mouth daily with breakfast. Taking BID [provider] Taking Active   metoprolol succinate (TOPROL XL) 25 MG 24 hr tablet [id number] No Take 1 tablet (25 mg total) by mouth in the morning and at bedtime. Sherren Mocha, MD Taking Active   mometasone (ELOCON) 0.1 % cream [id number] No mometasone 0.1 % topical cream  APPLY TO AFFECTED AREA EVERY DAY AS NEEDED ITCHING [provider] Taking Active   NEOMYCIN-POLYMYXIN-HYDROCORTISONE (CORTISPORIN) 1 % SOLN otic solution [id number] No Place 2 drops into both ears as directed. [provider] Taking Active Multiple Informants           Med Note Meda Coffee, LISA L   Thu Mar 30, 2015  7:30 PM)    nitroGLYCERIN (NITROSTAT) 0.4 MG SL tablet [id number] No Place 1 tablet (0.4 mg total) under the tongue every 5 (five) minutes as needed for chest pain. Sherren Mocha, MD Taking Active   omeprazole (PRILOSEC OTC) 20 MG tablet [id number] No Take 20 mg by mouth daily as needed (reflux). [provider] Taking Active   ondansetron (ZOFRAN ODT) 4 MG disintegrating  tablet [id number] No  Take 1 tablet (4 mg total) by mouth every 8 (eight) hours as needed for nausea or vomiting. Lillard Anes, MD Taking Active   rosuvastatin (CRESTOR) 10 MG tablet [id number] No TAKE 1 TABLET BY MOUTH EVERY Lacie Scotts, MD Taking Active   sertraline (ZOLOFT) 50 MG tablet [id number] No TAKE 1 TABLET BY MOUTH EVERY DAY Lillard Anes, MD Taking Active   traMADol (ULTRAM) 50 MG tablet [id number] No Take 0.5-1 tablets (25-50 mg total) by mouth every 12 (twelve) hours as needed. Owens Shark, NP Taking Active   vitamin B-12 (CYANOCOBALAMIN) 1000 MCG tablet [id number] No Take 1,000 mcg by mouth daily. [provider] Taking Active             Patient Active Problem List   Diagnosis Date Noted   Hepatocellular carcinoma ([place]) 08/12/2020   Goals of care, counseling/discussion 08/12/2020   Atypical atrial flutter ([place]) 08/03/2020   Multinodular goiter 02/24/2020   Renal artery stenosis ([place]) 02/24/2020   Type 2 diabetes mellitus with peripheral angiopathy ([place]) 02/24/2020   Closed fracture of base of first metacarpal bone of left hand 07/01/2019   Fall due to tripping on loose carpet 06/17/2019   Fracture of phalanx of left thumb 06/17/2019   Contusion, nose 06/17/2019   Open wound of right lower leg 06/17/2019   Obesity, diabetes, and hypertension syndrome ([place]) 04/20/2019   Mixed conductive and sensorineural hearing loss, bilateral 04/20/2019   Hemiparesis affecting left side as late effect of cerebrovascular accident ([place]) 04/20/2019   Bilateral nonexudative age-related macular degeneration 04/20/2019   Diabetic glomerulopathy ([place]) 04/20/2019   Chronic kidney disease, stage 3a ([place]) 04/20/2019   Secondary hypercoagulable state ([place]) 01/01/2019   Paroxysmal atrial fibrillation ([place]) 08/05/2016   Acute ischemic stroke ([place]) 06/23/2016   CKD (chronic kidney disease), stage II 06/23/2016   Carotid artery stenosis 04/13/2015   Insomnia  04/25/2012   Pain in limb 12/09/2011   Hyperlipidemia 06/18/2008   RENAL ARTERY STENOSIS 06/18/2008   GASTROESOPHAGEAL REFLUX DISEASE 06/18/2008   PEPTIC ULCER DISEASE, HX OF 06/18/2008   CATARACT EXTRACTIONS, BILATERAL, HX OF 06/18/2008   Other acquired absence of organ 06/18/2008   PERCUTANEOUS TRANSLUMINAL CORONARY ANGIOPLASTY, HX OF 06/18/2008   Other postprocedural status(V45.89) 06/18/2008   Essential hypertension 05/16/2007   Coronary atherosclerosis 05/16/2007   ARTHRITIS 05/16/2007   OSTEOPENIA 05/16/2007   HEMORRHOIDS 11/18/2006    Immunization History  Administered Date(s) Administered   Fluad Quad(high Dose 65+) 11/24/2019   Influenza Split 10/26/2008, 11/26/2012, 11/15/2013   Influenza-Unspecified 11/18/2016   PFIZER(Purple Top)SARS-COV-2 Vaccination 03/03/2019, 03/24/2019, 12/16/2019   Pneumococcal Conjugate-13 09/22/2013   Pneumococcal Polysaccharide-23 02/12/2007, 06/22/2020    Conditions to be addressed/monitored:  Hypertension, Hyperlipidemia, Diabetes, Atrial Fibrillation, GERD, and Osteopenia  There are no care plans that you recently modified to display for this patient.    Medication Assistance: Application for ***  medication assistance program. in process.  Anticipated assistance start date ***.  See plan of care for additional detail.  Compliance/Adherence/Medication fill history: Care Gaps: ***  Star-Rating Drugs: ***  Patient's preferred pharmacy is:  [place], [place] [address] Phone: [phone] Fax: [phone]  [id number] [place] [facility] [address] [place] [id number]: [phone]: [phone] [id number] [place]  [place] [address] Phone: [phone] Fax: [phone]  Uses pill box? {Yes or  If no, why not?:20788} Pt endorses ***% compliance  We discussed: {Pharmacy options:24294} Patient decided to: {US Pharmacy [facility]  Care Plan and Follow Up Patient Decision:  Patient agrees to Care Plan and Follow-up.  Plan: Telephone follow up appointment with care management team member scheduled for:  ***  ***    Current Barriers:  {pharmacybarriers:24917}  Pharmacist Clinical Goal(s):  Patient will {PHARMACYGOALCHOICES:24921} through collaboration with PharmD and provider.   Interventions: 1:1 collaboration with Lillard Anes, MD regarding development and update of comprehensive plan of care as evidenced by provider attestation and co-signature Inter-disciplinary care team collaboration (see longitudinal plan of care) Comprehensive medication review performed; medication list updated in electronic medical record  Hypertension  (Status:{CMCPGOALSTATUS:25802})   Med Management Intervention: {PHARMMEDMGMNT:25878}  (BP goal {CHL HP UPSTREAM Pharmacist BP ranges:[phone]}) -{US controlled/uncontrolled:25276} -Current treatment: *** -Medications previously tried: ***  -Current home readings: *** -Current dietary habits: *** -Current exercise habits: *** -{ACTIONS;DENIES/REPORTS:21021675} hypotensive/hypertensive symptoms -Educated on {CCM BP Counseling:25124} -Counseled to monitor BP at home ***, document, and provide log at future appointments -{CCMPHARMDINTERVENTION:25122}  Hyperlipidemia: (LDL goal < ***) -{US controlled/uncontrolled:25276} -Current treatment: *** -Medications previously tried: ***  -Current dietary patterns: *** -Current exercise habits: *** -Educated on {CCM HLD Counseling:25126} -{CCMPHARMDINTERVENTION:25122}  Diabetes (A1c goal {A1c goals:23924}) -{US controlled/uncontrolled:25276} -Current medications: *** -Medications previously tried: ***  -Current home glucose readings fasting glucose: *** post prandial glucose:  *** -{ACTIONS;DENIES/REPORTS:21021675} hypoglycemic/hyperglycemic symptoms -Current meal patterns:  breakfast: ***  lunch: ***  dinner: *** snacks: *** drinks: *** -Current exercise: *** -Educated on {CCM DM COUNSELING:25123} -Counseled to check feet daily and get yearly eye exams -{CCMPHARMDINTERVENTION:25122}  Atrial Fibrillation (Goal: prevent stroke and major bleeding) -{US controlled/uncontrolled:25276} -CHADSVASC: *** -Current treatment: Rate control: *** Anticoagulation: *** -Medications previously tried: *** -Home BP and HR readings: ***  -Counseled on {CCMAFIBCOUNSELING:25120} -{CCMPHARMDINTERVENTION:25122}  Osteoporosis / Osteopenia (Goal ***) -{US controlled/uncontrolled:25276} -Last DEXA Scan: ***   T-Score femoral neck: ***  T-Score total hip: ***  T-Score lumbar spine: ***  T-Score forearm radius: ***  10-year probability of major osteoporotic fracture: ***  10-year probability of hip fracture: *** -Patient {is;is not an osteoporosis candidate:23886} -Current treatment  *** -Medications previously tried: ***  -{Osteoporosis Counseling:23892} -{CCMPHARMDINTERVENTION:25122}  *** (Goal: ***) -{US controlled/uncontrolled:25276} -Current treatment  *** -Medications previously tried: ***  -{CCMPHARMDINTERVENTION:25122}  Health Maintenance -Vaccine gaps: *** -Current therapy:  *** -Educated on {ccm supplement counseling:25128} -{CCM Patient satisfied:25129} -{CCMPHARMDINTERVENTION:25122}  Patient Goals/Self-Care Activities Patient will:  - {pharmacypatientgoals:24919}  Follow Up Plan: {CM FOLLOW UP RVAC:45848}

## 2020-09-13 ENCOUNTER — Telehealth: Payer: Self-pay

## 2020-09-13 NOTE — Chronic Care Management (AMB) (Signed)
Chronic Care Management Pharmacy Assistant   Name: JEWELL HAUGHT  MRN: [id number] DOB: [birth year]  Stacy [name] is an 87 y.o. year old female who presents for her initial CCM visit with the clinical pharmacist.  Reason for Encounter: Chart Prep/ IQ  Recent office visits:  08/04/20- Reinaldo Meeker, MD- seen for abdominal pain, no medication changes, follow up 3 months 07/12/20- Reinaldo Meeker, MD- seen for follow up of abdominal pain,  referral to oncology, MRI scheduled, [place] scheduled, follow up 1 month 06/22/20- Reinaldo Meeker, MD- seen for chronic visit, pneumococcal vaccine administered, no medication changes, follow up 4 months  Recent consult visits:  09/07/20- [name], MD ( Oncology)- seen for hepatocellular carcinoma, to complete cycle 2 of systemic therapy, short course diflucan x 4 days for mild thrush, follow up 3 weeks for cycle 3 of systemic therapy 08/31/20- Orders Only (Oncology)- ciprofloxacin 250 mg twice daily x 5 days for UTI  08/11/20- Ned Card, NP (Oncology)- seen for hepatocellular carcinoma, started tramadol 0.5- 1 tabs every 12 hrs prn, labs ordered,  recommended Avastin/atezolizumab on a 3-week schedule, follow up as scheduled for 1st cycle 08/02/20- [name], MD ( Oncology)- seen for new patient consult of multiple liver masses, referred for ultrasound guided biopsy, labs ordered, follow up for labs 07/26/20- Sherren Mocha, MD ( Cardiology)- seen for follow up evaluation of CAD, increased metoprolol from once daily to 25 mg twice daily, referral to AFIB clinic, follow up 4 months 05/05/20- Samantha Rhyne, PA-C ( Vasc Surg)- seen for follow up for carotid artery stenosis, no medication changes, no follow up documented  Hospital visits:  Medication Reconciliation was completed by comparing discharge summary, patient's EMR and Pharmacy list, and upon discussion with patient.  [facility] on  07/10/20 due to abdominal pain  New?Medications Started at [facility] Discharge:?? Ondansetron 4 mg q8h prn  All other medications  remain the same after Hospital Discharge    Medications: Outpatient Encounter Medications as of 09/13/2020  Medication Sig   ACCU-CHEK SMARTVIEW test strip 1 each by Other route as needed for other (blood sugar test strips).    cetirizine (ZYRTEC) 10 MG tablet TAKE 1 TABLET BY MOUTH EVERY DAY   Cholecalciferol (VITAMIN D-3) 25 MCG (1000 UT) CAPS Take 1,000 Units by mouth 2 (two) times daily.   conjugated estrogens (PREMARIN) vaginal cream Place 1 g vaginally once a week. .625 mg once weekly   diphenhydrAMINE (BENADRYL) 25 MG tablet Take 25 mg by mouth every 6 (six) hours as needed.   docusate sodium (COLACE) 100 MG capsule Take 100 mg by mouth 2 (two) times daily. As needed   ELIQUIS 5 MG TABS tablet TAKE 1 TABLET BY MOUTH TWICE A DAY   empagliflozin (JARDIANCE) 10 MG TABS tablet 1 tablet   Famotidine-Ca Carb-Mag Hydrox (PEPCID COMPLETE PO) Take by mouth.   ipratropium (ATROVENT) 0.06 % nasal spray As needed   linaclotide (LINZESS) 72 MCG capsule Take 72 mcg by mouth daily. As needed   Melatonin 10 MG TABS Take 10 mg by mouth as needed.   metFORMIN (GLUCOPHAGE) 500 MG tablet Take 500 mg by mouth daily with breakfast. Taking BID   metoprolol succinate (TOPROL XL) 25 MG 24 hr tablet Take 1 tablet (25 mg total) by mouth in the morning and at bedtime.   mometasone (ELOCON) 0.1 % cream mometasone 0.1 % topical cream  APPLY TO AFFECTED AREA EVERY DAY AS NEEDED ITCHING  NEOMYCIN-POLYMYXIN-HYDROCORTISONE (CORTISPORIN) 1 % SOLN otic solution Place 2 drops into both ears as directed.   nitroGLYCERIN (NITROSTAT) 0.4 MG SL tablet Place 1 tablet (0.4 mg total) under the tongue every 5 (five) minutes as needed for chest pain.   omeprazole (PRILOSEC OTC) 20 MG tablet Take 20 mg by mouth daily as needed (reflux).   ondansetron (ZOFRAN ODT) 4 MG disintegrating tablet Take 1  tablet (4 mg total) by mouth every 8 (eight) hours as needed for nausea or vomiting.   rosuvastatin (CRESTOR) 10 MG tablet TAKE 1 TABLET BY MOUTH EVERY DAY   sertraline (ZOLOFT) 50 MG tablet TAKE 1 TABLET BY MOUTH EVERY DAY   traMADol (ULTRAM) 50 MG tablet Take 0.5-1 tablets (25-50 mg total) by mouth every 12 (twelve) hours as needed.   vitamin B-12 (CYANOCOBALAMIN) 1000 MCG tablet Take 1,000 mcg by mouth daily.   No facility-administered encounter medications on file as of 09/13/2020.     Lab Results  Component Value Date/Time   HGBA1C 7.7 (H) 06/20/2020 11:07 AM   HGBA1C 9.5 (H) 02/22/2020 10:58 AM   MICROALBUR 10 06/22/2020 03:07 PM   MICROALBUR 10 04/21/2019 03:14 PM     BP Readings from Last 3 Encounters:  09/07/20 126/63  08/18/20 (!) 111/58  08/11/20 115/64   Chart prep completed prior to patient cancelling appointment. Changed to non billable time. See notes.   Current Documented Medications cetirizine  10 MG - 90 DS last filled 04/21/20 ELIQUIS 5 MG - 90 DS last filled 05/18/20 ipratropium  0.06 % - 90 DS last filled 12/06/19 metoprolol succinate 25 MG - 90 DS last filled 06/20/20 mometasone 0.1 %- 7 DS last filled 09/10/19 NEOMYCIN-POLYMYXIN-HYDROCORTISONE  1 % - 2020 last filled nitroGLYCERIN 0.4 MG  - 2020 last filled sertraline  50 MG  - 90 DS last filled 06/20/20  No Fill Hx traMADol 50 MG tablet vitamin B-12  1000 MCG tablet Cholecalciferol 25 MCG CAPS conjugated estrogens vaginal cream diphenhydrAMINE 25 MG tablet docusate sodium  100 MG capsule Famotidine-Ca Carb-Mag Hydrox linaclotide 72 MCG capsule Melatonin 10 MG TABS omeprazole  20 MG tablet ondansetron  4 MG disintegrating tablet  Star Rating Drugs:  Metformin 500 mg- 90 DS last filled 05/22/19 empagliflozin 10 MG - 90 DS last filled 06/22/20 rosuvastatin 10 MG  - 90 DS last filled 05/10/20  Have you seen any other providers since your last visit with PCP?   Any changes in your medications or  health?   Any side effects from any medications?   Do you have an symptoms or problems not managed by your medications?  Any concerns about your health right now?   Has your provider asked that you check blood pressure, blood sugar, or follow special diet at home?  Do you get any type of exercise on a regular basis?   Can you think of a goal you would like to reach for your health?   Do you have any problems getting your medications?   Is there anything that you would like to discuss during the appointment?    Care Gaps: Last annual wellness visit? 81.27.51 If applicable: Last eye exam / retinopathy screening? 10.06.21 See Media Last diabetic foot exam? 05.12.22 by PCP   [facility] CPA, CMA

## 2020-09-14 ENCOUNTER — Encounter: Payer: Medicare Other | Admitting: Nutrition

## 2020-09-18 ENCOUNTER — Other Ambulatory Visit: Payer: Self-pay | Admitting: Pharmacist

## 2020-09-18 MED ORDER — APIXABAN 5 MG PO TABS: 5.0000 mg | ORAL_TABLET | Freq: Two times a day (BID) | ORAL | 1 refills | Status: DC

## 2020-09-20 ENCOUNTER — Ambulatory Visit: Payer: Medicare Other

## 2020-09-24 ENCOUNTER — Other Ambulatory Visit: Payer: Self-pay | Admitting: Oncology

## 2020-09-28 ENCOUNTER — Encounter: Payer: Self-pay | Admitting: *Deleted

## 2020-09-28 ENCOUNTER — Other Ambulatory Visit: Payer: Self-pay

## 2020-09-28 ENCOUNTER — Inpatient Hospital Stay: Payer: Medicare Other

## 2020-09-28 ENCOUNTER — Inpatient Hospital Stay: Payer: Medicare Other | Attending: Nurse Practitioner

## 2020-09-28 ENCOUNTER — Telehealth: Payer: Self-pay

## 2020-09-28 ENCOUNTER — Inpatient Hospital Stay (HOSPITAL_BASED_OUTPATIENT_CLINIC_OR_DEPARTMENT_OTHER): Payer: Medicare Other | Admitting: Oncology

## 2020-09-28 VITALS — BP 112/68 | HR 85 | Temp 98.1°F | Resp 18 | Ht 62.0 in | Wt 130.2 lb

## 2020-09-28 VITALS — BP 128/66

## 2020-09-28 DIAGNOSIS — R630 Anorexia: Secondary | ICD-10-CM | POA: Diagnosis not present

## 2020-09-28 DIAGNOSIS — Z85828 Personal history of other malignant neoplasm of skin: Secondary | ICD-10-CM | POA: Diagnosis not present

## 2020-09-28 DIAGNOSIS — I4891 Unspecified atrial fibrillation: Secondary | ICD-10-CM | POA: Diagnosis not present

## 2020-09-28 DIAGNOSIS — I2510 Atherosclerotic heart disease of native coronary artery without angina pectoris: Secondary | ICD-10-CM | POA: Diagnosis not present

## 2020-09-28 DIAGNOSIS — C220 Liver cell carcinoma: Secondary | ICD-10-CM

## 2020-09-28 DIAGNOSIS — R634 Abnormal weight loss: Secondary | ICD-10-CM | POA: Insufficient documentation

## 2020-09-28 DIAGNOSIS — Z87440 Personal history of urinary (tract) infections: Secondary | ICD-10-CM | POA: Diagnosis not present

## 2020-09-28 DIAGNOSIS — R5381 Other malaise: Secondary | ICD-10-CM | POA: Insufficient documentation

## 2020-09-28 DIAGNOSIS — Z5112 Encounter for antineoplastic immunotherapy: Secondary | ICD-10-CM | POA: Insufficient documentation

## 2020-09-28 DIAGNOSIS — R5383 Other fatigue: Secondary | ICD-10-CM

## 2020-09-28 DIAGNOSIS — Z7901 Long term (current) use of anticoagulants: Secondary | ICD-10-CM | POA: Insufficient documentation

## 2020-09-28 DIAGNOSIS — E119 Type 2 diabetes mellitus without complications: Secondary | ICD-10-CM | POA: Diagnosis not present

## 2020-09-28 DIAGNOSIS — K06020 Generalized gingival recession, unspecified: Secondary | ICD-10-CM | POA: Insufficient documentation

## 2020-09-28 LAB — CMP (CANCER CENTER ONLY)
ALT: 89 U/L — ABNORMAL HIGH (ref 0–44)
AST: 187 U/L (ref 15–41)
Albumin: 2.9 g/dL — ABNORMAL LOW (ref 3.5–5.0)
Alkaline Phosphatase: 240 U/L — ABNORMAL HIGH (ref 38–126)
Anion gap: 9 (ref 5–15)
BUN: 17 mg/dL (ref 8–23)
CO2: 24 mmol/L (ref 22–32)
Calcium: 8.9 mg/dL (ref 8.9–10.3)
Chloride: 102 mmol/L (ref 98–111)
Creatinine: 0.92 mg/dL (ref 0.44–1.00)
GFR, Estimated: 58 mL/min — ABNORMAL LOW (ref >60)
Glucose, Bld: 207 mg/dL — ABNORMAL HIGH (ref 70–99)
Potassium: 4.3 mmol/L (ref 3.5–5.1)
Sodium: 135 mmol/L (ref 135–145)
Total Bilirubin: 0.7 mg/dL (ref 0.3–1.2)
Total Protein: 6 g/dL — ABNORMAL LOW (ref 6.5–8.1)

## 2020-09-28 LAB — CBC WITH DIFFERENTIAL (CANCER CENTER ONLY)
Abs Immature Granulocytes: 0.02 10*3/uL (ref 0.00–0.07)
Basophils Absolute: 0 10*3/uL (ref 0.0–0.1)
Basophils Relative: 0 %
Eosinophils Absolute: 0.2 10*3/uL (ref 0.0–0.5)
Eosinophils Relative: 3 %
HCT: 47.7 % — ABNORMAL HIGH (ref 36.0–46.0)
Hemoglobin: 14.4 g/dL (ref 12.0–15.0)
Immature Granulocytes: 0 %
Lymphocytes Relative: 49 %
Lymphs Abs: 3.8 10*3/uL (ref 0.7–4.0)
MCH: 25 pg — ABNORMAL LOW (ref 26.0–34.0)
MCHC: 30.2 g/dL (ref 30.0–36.0)
MCV: 82.7 fL (ref 80.0–100.0)
Monocytes Absolute: 0.6 10*3/uL (ref 0.1–1.0)
Monocytes Relative: 7 %
Neutro Abs: 3.2 10*3/uL (ref 1.7–7.7)
Neutrophils Relative %: 41 %
Platelet Count: 274 10*3/uL (ref 150–400)
RBC: 5.77 MIL/uL — ABNORMAL HIGH (ref 3.87–5.11)
RDW: 17.9 % — ABNORMAL HIGH (ref 11.5–15.5)
WBC Count: 7.8 10*3/uL (ref 4.0–10.5)
nRBC: 0 % (ref 0.0–0.2)

## 2020-09-28 LAB — TSH: TSH: 3.935 u[IU]/mL (ref 0.350–4.500)

## 2020-09-28 MED ORDER — SODIUM CHLORIDE 0.9 % IV SOLN: Freq: Once | INTRAVENOUS | Status: AC

## 2020-09-28 MED ORDER — SODIUM CHLORIDE 0.9 % IV SOLN
1200.0000 mg | Freq: Once | INTRAVENOUS | Status: AC
  Administered 2020-09-28: 1200 mg via INTRAVENOUS
  Filled 2020-09-28: qty 20

## 2020-09-28 MED ORDER — SODIUM CHLORIDE 0.9 % IV SOLN
15.0000 mg/kg | Freq: Once | INTRAVENOUS | Status: AC
  Administered 2020-09-28: 900 mg via INTRAVENOUS
  Filled 2020-09-28: qty 32

## 2020-09-28 NOTE — Progress Notes (Signed)
Per Dr. Benay Spice, ok to treat with AST 187 and ALT 89.

## 2020-09-28 NOTE — Patient Instructions (Signed)
Person  Discharge Instructions: Thank you for choosing [facility] to provide your oncology and hematology care.   If you have a lab appointment with the [facility], please go directly to the [facility] and check in at the registration area.   Wear comfortable clothing and clothing appropriate for easy access to any Portacath or PICC line.   We strive to give you quality time with your provider. You may need to reschedule your appointment if you arrive late (15 or more minutes).  Arriving late affects you and other patients whose appointments are after yours.  Also, if you miss three or more appointments without notifying the office, you may be dismissed from the clinic at the provider's discretion.      For prescription refill requests, have your pharmacy contact our office and allow 72 hours for refills to be completed.    Today you received the following chemotherapy and/or immunotherapy agents AVASTIN and TECENTRIQ      To help prevent nausea and vomiting after your treatment, we encourage you to take your nausea medication as directed.  BELOW ARE SYMPTOMS THAT SHOULD BE REPORTED IMMEDIATELY: *FEVER GREATER THAN 100.4 F (38 C) OR HIGHER *CHILLS OR SWEATING *NAUSEA AND VOMITING THAT IS NOT CONTROLLED WITH YOUR NAUSEA MEDICATION *UNUSUAL SHORTNESS OF BREATH *UNUSUAL BRUISING OR BLEEDING *URINARY PROBLEMS (pain or burning when urinating, or frequent urination) *BOWEL PROBLEMS (unusual diarrhea, constipation, pain near the anus) TENDERNESS IN MOUTH AND THROAT WITH OR WITHOUT PRESENCE OF ULCERS (sore throat, sores in mouth, or a toothache) UNUSUAL RASH, SWELLING OR PAIN  UNUSUAL VAGINAL DISCHARGE OR ITCHING   Items with * indicate a potential emergency and should be followed up as soon as possible or go to the Emergency Department if any problems should occur.  Please show the CHEMOTHERAPY ALERT CARD or IMMUNOTHERAPY ALERT CARD at  check-in to the Emergency Department and triage nurse.  Should you have questions after your visit or need to cancel or reschedule your appointment, please contact [facility]  Dept: [phone]  and follow the prompts.  Office hours are 8:00 a.m. to 4:30 p.m. Monday - Friday. Please note that voicemails left after 4:00 p.m. may not be returned until the following business day.  We are closed weekends and major holidays. You have access to a nurse at all times for urgent questions. Please call the main number to the clinic Dept: [phone] and follow the prompts.   For any non-urgent questions, you may also contact your provider using MyChart. We now offer e-Visits for anyone 35 and older to request care online for non-urgent symptoms. For details visit [email or web address].   Also download the MyChart app! Go to the app store, search "MyChart", open the app, select [facility], and log in with your MyChart username and password.  Due to Covid, a mask is required upon entering the hospital/clinic. If you do not have a mask, one will be given to you upon arrival. For doctor visits, patients may have 1 support person aged 16 or older with them. For treatment visits, patients cannot have anyone with them due to current Covid guidelines and our immunocompromised population.    Bevacizumab injection What is this medication? BEVACIZUMAB (be va SIZ yoo mab) is a monoclonal antibody. It is used to treatmany types of cancer. This medicine may be used for other purposes; ask your health care provider orpharmacist if you have questions. COMMON BRAND NAME(S): Avastin,  MVASI, Noah Charon What should I tell my care team before I take this medication? They need to know if you have any of these conditions: diabetes heart disease high blood pressure history of coughing up blood prior anthracycline chemotherapy (e.g., doxorubicin, daunorubicin, epirubicin) recent or ongoing  radiation therapy recent or planning to have surgery stroke an unusual or allergic reaction to bevacizumab, hamster proteins, mouse proteins, other medicines, foods, dyes, or preservatives pregnant or trying to get pregnant breast-feeding How should I use this medication? This medicine is for infusion into a vein. It is given by a health careprofessional in a hospital or clinic setting. Talk to your pediatrician regarding the use of this medicine in children.Special care may be needed. Overdosage: If you think you have taken too much of this medicine contact apoison control center or emergency room at once. NOTE: This medicine is only for you. Do not share this medicine with others. What if I miss a dose? It is important not to miss your dose. Call your doctor or health careprofessional if you are unable to keep an appointment. What may interact with this medication? Interactions are not expected. This list may not describe all possible interactions. Give your health care provider a list of all the medicines, herbs, non-prescription drugs, or dietary supplements you use. Also tell them if you smoke, drink alcohol, or use illegaldrugs. Some items may interact with your medicine. What should I watch for while using this medication? Your condition will be monitored carefully while you are receiving this medicine. You will need important blood work and urine testing done while youare taking this medicine. This medicine may increase your risk to bruise or bleed. Call your doctor orhealth care professional if you notice any unusual bleeding. Before having surgery, talk to your health care provider to make sure it is ok. This drug can increase the risk of poor healing of your surgical site or wound. You will need to stop this drug for 28 days before surgery. After surgery, wait at least 28 days before restarting this drug. Make sure the surgical site or wound is healed enough before restarting this drug.  Talk to your health careprovider if questions. Do not become pregnant while taking this medicine or for 6 months after stopping it. Women should inform their doctor if they wish to become pregnant or think they might be pregnant. There is a potential for serious side effects to an unborn child. Talk to your health care professional or pharmacist for more information. Do not breast-feed an infant while taking this medicine andfor 6 months after the last dose. This medicine has caused ovarian failure in some women. This medicine may interfere with the ability to have a child. You should talk to your doctor orhealth care professional if you are concerned about your fertility. What side effects may I notice from receiving this medication? Side effects that you should report to your doctor or health care professionalas soon as possible: allergic reactions like skin rash, itching or hives, swelling of the face, lips, or tongue chest pain or chest tightness chills coughing up blood high fever seizures severe constipation signs and symptoms of bleeding such as bloody or black, tarry stools; red or dark-Rhody urine; spitting up blood or K[place] material that looks like coffee grounds; red spots on the skin; unusual bruising or bleeding from the eye, gums, or nose signs and symptoms of a blood clot such as breathing problems; chest pain; severe, sudden headache; pain, swelling, warmth in the leg  signs and symptoms of a stroke like changes in vision; confusion; trouble speaking or understanding; severe headaches; sudden numbness or weakness of the face, arm or leg; trouble walking; dizziness; loss of balance or coordination stomach pain sweating swelling of legs or ankles vomiting weight gain Side effects that usually do not require medical attention (report to yourdoctor or health care professional if they continue or are bothersome): back pain changes in taste decreased appetite dry  skin nausea tiredness This list may not describe all possible side effects. Call your doctor for medical advice about side effects. You may report side effects to FDA at1-800-FDA-1088. Where should I keep my medication? This drug is given in a hospital or clinic and will not be stored at home. NOTE: This sheet is a summary. It may not cover all possible information. If you have questions about this medicine, talk to your doctor, pharmacist, orhealth care provider.  2022 Elsevier/Gold Standard (2018-11-25 10:50:46)  Atezolizumab injection What is this medication? ATEZOLIZUMAB (a te zoe LIZ ue mab) is a monoclonal antibody. It is used to treat bladder cancer (urothelial cancer), liver cancer, lung cancer, andmelanoma. This medicine may be used for other purposes; ask your health care provider orpharmacist if you have questions. COMMON BRAND NAME(S): Tecentriq What should I tell my care team before I take this medication? They need to know if you have any of these conditions: autoimmune diseases like Crohn's disease, ulcerative colitis, or lupus have had or planning to have an allogeneic stem cell transplant (uses someone else's stem cells) history of organ transplant history of radiation to the chest nervous system problems like myasthenia gravis or Guillain-Barre syndrome an unusual or allergic reaction to atezolizumab, other medicines, foods, dyes, or preservatives pregnant or trying to get pregnant breast-feeding How should I use this medication? This medicine is for infusion into a vein. It is given by a health careprofessional in a hospital or clinic setting. A special MedGuide will be given to you before each treatment. Be sure to readthis information carefully each time. Talk to your pediatrician regarding the use of this medicine in children.Special care may be needed. Overdosage: If you think you have taken too much of this medicine contact apoison control center or emergency room  at once. NOTE: This medicine is only for you. Do not share this medicine with others. What if I miss a dose? It is important not to miss your dose. Call your doctor or health careprofessional if you are unable to keep an appointment. What may interact with this medication? Interactions have not been studied. This list may not describe all possible interactions. Give your health care provider a list of all the medicines, herbs, non-prescription drugs, or dietary supplements you use. Also tell them if you smoke, drink alcohol, or use illegaldrugs. Some items may interact with your medicine. What should I watch for while using this medication? Your condition will be monitored carefully while you are receiving thismedicine. You may need blood work done while you are taking this medicine. Do not become pregnant while taking this medicine or for at least 5 months after stopping it. Women should inform their doctor if they wish to become pregnant or think they might be pregnant. There is a potential for serious side effects to an unborn child. Talk to your health care professional or pharmacist for more information. Do not breast-feed an infant while taking this medicineor for at least 5 months after the last dose. What side effects may I notice from receiving  this medication? Side effects that you should report to your doctor or health care professionalas soon as possible: allergic reactions like skin rash, itching or hives, swelling of the face, lips, or tongue black, tarry stools bloody or watery diarrhea breathing problems changes in vision chest pain or chest tightness chills facial flushing fever headache signs and symptoms of high blood sugar such as dizziness; dry mouth; dry skin; fruity breath; nausea; stomach pain; increased hunger or thirst; increased urination signs and symptoms of liver injury like dark yellow or brown urine; general ill feeling or flu-like symptoms; light-colored  stools; loss of appetite; nausea; right upper belly pain; unusually weak or tired; yellowing of the eyes or skin stomach pain trouble passing urine or change in the amount of urine Side effects that usually do not require medical attention (report to yourdoctor or health care professional if they continue or are bothersome): bone pain cough diarrhea joint pain muscle pain muscle weakness swelling of arms or legs tiredness weight loss This list may not describe all possible side effects. Call your doctor for medical advice about side effects. You may report side effects to FDA at1-800-FDA-1088. Where should I keep my medication? This drug is given in a hospital or clinic and will not be stored at home. NOTE: This sheet is a summary. It may not cover all possible information. If you have questions about this medicine, talk to your doctor, pharmacist, orhealth care provider.  2022 Elsevier/Gold Standard (2019-10-28 13:59:34)

## 2020-09-28 NOTE — Telephone Encounter (Signed)
Per Dr Benay Spice spoke with Pt's daughter who is concerned about Pt's gums. Dr. Benay Spice informed me to tell Pt's daughter. That Pt is on avastin and this could cause osteonecrosis which could cause issue with gums. Pt's daughter verbalized understanding.

## 2020-09-28 NOTE — Progress Notes (Signed)
  [facility] OFFICE PROGRESS NOTE   Diagnosis: Hepatocellular carcinoma  INTERVAL HISTORY:   Stacy [name] returns as scheduled.  She is here with her daughter.  She completed another cycle of atezolizumab/bevacizumab on 08/30/2020.  No rash or diarrhea.  She has malaise.  No pain.  No bleeding.  She has soreness at the lower anterior gum.  Objective:  Vital signs in last 24 hours:  Blood pressure 112/68, pulse 85, temperature 98.1 F (36.7 C), temperature source Oral, resp. rate 18, height 5\' 2"  (1.575 m), weight 130 lb 3.2 oz (59.1 kg), SpO2 96 %.    HEENT: There is mild gum recession and erythema at a mid low anterior tooth, no thrush Resp: Lungs clear bilaterally Cardio: Regular rate and rhythm GI: No hepatosplenomegaly, no apparent ascites, nontender Vascular: No leg edema   Lab Results:  Lab Results  Component Value Date   WBC 7.8 09/28/2020   HGB 14.4 09/28/2020   HCT 47.7 (H) 09/28/2020   MCV 82.7 09/28/2020   PLT 274 09/28/2020   NEUTROABS 3.2 09/28/2020    CMP  Lab Results  Component Value Date   NA 135 09/28/2020   K 4.3 09/28/2020   CL 102 09/28/2020   CO2 24 09/28/2020   GLUCOSE 207 (H) 09/28/2020   BUN 17 09/28/2020   CREATININE 0.92 09/28/2020   CALCIUM 8.9 09/28/2020   PROT 6.0 (L) 09/28/2020   ALBUMIN 2.9 (L) 09/28/2020   AST 187 (HH) 09/28/2020   ALT 89 (H) 09/28/2020   ALKPHOS 240 (H) 09/28/2020   BILITOT 0.7 09/28/2020   GFRNONAA 58 (L) 09/28/2020   GFRAA 54 (L) 02/22/2020     Medications: I have reviewed the patient's current medications.   Assessment/Plan:  Multiple liver masses CT abdomen/pelvis 07/11/2018, low-attenuation liver lesions incompletely characterized without contrast Ultrasound abdomen 07/10/2020-innumerable incompletely assessed hypoechoic masses in the liver MRI abdomen 07/29/2020- innumerable hypervascular liver masses, nodular liver contour, 1.5 cm left adrenal mass Biopsy liver lesion  08/08/2020-hepatocellular carcinoma, moderately differentiated Cycle 1 atezolizumab/bevacizumab 08/18/2020 Cycle 2 atezolizumab/bevacizumab 09/07/2020 Cycle 3 atezolizumab/bevacizumab 09/28/2020 Coronary artery disease Atrial fibrillation-maintained on anticoagulation History of basal cell and squamous cell skin cancers Diabetes Anorexia/weight loss Abdominal pain Urinary tract infection 08/30/2020    Disposition: Stacy [name] appears to be tolerating the treatment well.  She will complete another cycle today.  She will undergo a restaging CT after cycle 4.  I recommend she follow-up with her dentist to evaluate the tooth/gum recession.  This could be related to toxicity from Avastin. She will begin Peridex or Biotene rinse.  Stacy [name] will return for an office visit and cycle 4 atezolizumab/bevacizumab in 3 weeks.  [name], MD  09/28/2020  10:07 AM

## 2020-09-28 NOTE — Progress Notes (Signed)
CRITICAL VALUE STICKER  CRITICAL VALUE: AST [id number]  RECEIVER (on-site recipient of call):Chequita Mofield,RN  DATE & TIME NOTIFIED: 09/28/20 at 0954  MESSENGER (representative from lab):Phyllis  MD NOTIFIED: Dr. Benay Spice  TIME OF NOTIFICATION: 0018  RESPONSE:  Improved.

## 2020-10-02 ENCOUNTER — Telehealth: Payer: Self-pay

## 2020-10-02 NOTE — Telephone Encounter (Signed)
TC from above Pt's daughter stating Pt tested positive for Covid yesterday 10/02/20. Informed Pt's daughter to keep her mother hydrated with warm fluids and to call Pt's PCP for further instructions if her symptoms should get worse. Pt daughter verbalized understanding. No further problems or concerns noted.

## 2020-10-13 ENCOUNTER — Telehealth: Payer: Self-pay

## 2020-10-13 NOTE — Telephone Encounter (Signed)
TC from Pt's daughter stating the Pt has been having nose bleeds. Pt is on eliquis and she wanted Dr Benay Spice to be informed.Pt's daughter stated this morning she had a nose bleed that lasted an hour but called the triage line and was given instructions and it stopped. Discussed with Dr Benay Spice who stated Pt should call Dr Burt Knack to get PT/INR.To check Pt's coagulation time. Pt's daughter verbalized understanding.

## 2020-10-16 ENCOUNTER — Other Ambulatory Visit: Payer: Self-pay | Admitting: Oncology

## 2020-10-19 ENCOUNTER — Inpatient Hospital Stay: Payer: Medicare Other

## 2020-10-19 ENCOUNTER — Other Ambulatory Visit: Payer: Self-pay

## 2020-10-19 ENCOUNTER — Inpatient Hospital Stay: Payer: Medicare Other | Attending: Nurse Practitioner

## 2020-10-19 ENCOUNTER — Telehealth: Payer: Self-pay

## 2020-10-19 ENCOUNTER — Inpatient Hospital Stay (HOSPITAL_BASED_OUTPATIENT_CLINIC_OR_DEPARTMENT_OTHER): Payer: Medicare Other | Admitting: Oncology

## 2020-10-19 VITALS — BP 119/60 | HR 82 | Temp 98.1°F | Resp 18 | Ht 62.0 in | Wt 131.0 lb

## 2020-10-19 VITALS — BP 121/63

## 2020-10-19 DIAGNOSIS — Z5112 Encounter for antineoplastic immunotherapy: Secondary | ICD-10-CM | POA: Diagnosis not present

## 2020-10-19 DIAGNOSIS — N390 Urinary tract infection, site not specified: Secondary | ICD-10-CM | POA: Insufficient documentation

## 2020-10-19 DIAGNOSIS — I2510 Atherosclerotic heart disease of native coronary artery without angina pectoris: Secondary | ICD-10-CM | POA: Diagnosis not present

## 2020-10-19 DIAGNOSIS — R300 Dysuria: Secondary | ICD-10-CM | POA: Diagnosis not present

## 2020-10-19 DIAGNOSIS — Z85828 Personal history of other malignant neoplasm of skin: Secondary | ICD-10-CM | POA: Insufficient documentation

## 2020-10-19 DIAGNOSIS — E119 Type 2 diabetes mellitus without complications: Secondary | ICD-10-CM | POA: Insufficient documentation

## 2020-10-19 DIAGNOSIS — C220 Liver cell carcinoma: Secondary | ICD-10-CM

## 2020-10-19 DIAGNOSIS — R630 Anorexia: Secondary | ICD-10-CM | POA: Insufficient documentation

## 2020-10-19 DIAGNOSIS — I4891 Unspecified atrial fibrillation: Secondary | ICD-10-CM | POA: Insufficient documentation

## 2020-10-19 DIAGNOSIS — R634 Abnormal weight loss: Secondary | ICD-10-CM | POA: Diagnosis not present

## 2020-10-19 LAB — CBC WITH DIFFERENTIAL (CANCER CENTER ONLY)
Abs Immature Granulocytes: 0.03 10*3/uL (ref 0.00–0.07)
Basophils Absolute: 0.1 10*3/uL (ref 0.0–0.1)
Basophils Relative: 1 %
Eosinophils Absolute: 0.5 10*3/uL (ref 0.0–0.5)
Eosinophils Relative: 5 %
HCT: 42.3 % (ref 36.0–46.0)
Hemoglobin: 12.9 g/dL (ref 12.0–15.0)
Immature Granulocytes: 0 %
Lymphocytes Relative: 31 %
Lymphs Abs: 2.9 10*3/uL (ref 0.7–4.0)
MCH: 25.6 pg — ABNORMAL LOW (ref 26.0–34.0)
MCHC: 30.5 g/dL (ref 30.0–36.0)
MCV: 84.1 fL (ref 80.0–100.0)
Monocytes Absolute: 0.8 10*3/uL (ref 0.1–1.0)
Monocytes Relative: 9 %
Neutro Abs: 5.1 10*3/uL (ref 1.7–7.7)
Neutrophils Relative %: 54 %
Platelet Count: 244 10*3/uL (ref 150–400)
RBC: 5.03 MIL/uL (ref 3.87–5.11)
RDW: 18.6 % — ABNORMAL HIGH (ref 11.5–15.5)
WBC Count: 9.5 10*3/uL (ref 4.0–10.5)
nRBC: 0 % (ref 0.0–0.2)

## 2020-10-19 LAB — TOTAL PROTEIN, URINE DIPSTICK: Protein, ur: 30 mg/dL — AB

## 2020-10-19 LAB — CMP (CANCER CENTER ONLY)
ALT: 79 U/L — ABNORMAL HIGH (ref 0–44)
AST: 173 U/L (ref 15–41)
Albumin: 3.3 g/dL — ABNORMAL LOW (ref 3.5–5.0)
Alkaline Phosphatase: 209 U/L — ABNORMAL HIGH (ref 38–126)
Anion gap: 12 (ref 5–15)
BUN: 16 mg/dL (ref 8–23)
CO2: 23 mmol/L (ref 22–32)
Calcium: 9.5 mg/dL (ref 8.9–10.3)
Chloride: 105 mmol/L (ref 98–111)
Creatinine: 1.04 mg/dL — ABNORMAL HIGH (ref 0.44–1.00)
GFR, Estimated: 50 mL/min — ABNORMAL LOW (ref >60)
Glucose, Bld: 225 mg/dL — ABNORMAL HIGH (ref 70–99)
Potassium: 4 mmol/L (ref 3.5–5.1)
Sodium: 140 mmol/L (ref 135–145)
Total Bilirubin: 0.7 mg/dL (ref 0.3–1.2)
Total Protein: 6.4 g/dL — ABNORMAL LOW (ref 6.5–8.1)

## 2020-10-19 MED ORDER — SODIUM CHLORIDE 0.9 % IV SOLN
1200.0000 mg | Freq: Once | INTRAVENOUS | Status: AC
  Administered 2020-10-19: 1200 mg via INTRAVENOUS
  Filled 2020-10-19: qty 20

## 2020-10-19 MED ORDER — SODIUM CHLORIDE 0.9 % IV SOLN: Freq: Once | INTRAVENOUS | Status: AC

## 2020-10-19 MED ORDER — SODIUM CHLORIDE 0.9 % IV SOLN
15.0000 mg/kg | Freq: Once | INTRAVENOUS | Status: AC
  Administered 2020-10-19: 900 mg via INTRAVENOUS
  Filled 2020-10-19: qty 4

## 2020-10-19 NOTE — Progress Notes (Signed)
  [facility] OFFICE PROGRESS NOTE   Diagnosis: Hepatocellular carcinoma  INTERVAL HISTORY:   Stacy [name] returns as scheduled.  She completed another treatment with atezolizumab/bevacizumab on 09/28/2020.  No rash or diarrhea.  She has intermittent nosebleeding lasting for approximately 15 minutes.  The bleeding resolved with pressure.  No other bleeding.  No abdominal pain.  Good appetite.  Mild burning with urination.  Objective:  Vital signs in last 24 hours:  Blood pressure 119/60, pulse 82, temperature 98.1 F (36.7 C), temperature source Oral, resp. rate 18, height 5\' 2"  (1.575 m), weight 131 lb (59.4 kg), SpO2 96 %.    HEENT: No thrush, small ulcers at the bilateral buccal mucosa-appear to be healing, no blood in either nostril Resp: Lungs clear bilaterally Cardio: Irregular GI: No splenomegaly, the liver edge is palpable on deep palpation in the right subcostal region Vascular: No leg edema  Skin: No rash  Lab Results:  Lab Results  Component Value Date   WBC 9.5 10/19/2020   HGB 12.9 10/19/2020   HCT 42.3 10/19/2020   MCV 84.1 10/19/2020   PLT 244 10/19/2020   NEUTROABS 5.1 10/19/2020    CMP  Lab Results  Component Value Date   NA 140 10/19/2020   K 4.0 10/19/2020   CL 105 10/19/2020   CO2 23 10/19/2020   GLUCOSE 225 (H) 10/19/2020   BUN 16 10/19/2020   CREATININE 1.04 (H) 10/19/2020   CALCIUM 9.5 10/19/2020   PROT 6.4 (L) 10/19/2020   ALBUMIN 3.3 (L) 10/19/2020   AST 173 (HH) 10/19/2020   ALT 79 (H) 10/19/2020   ALKPHOS 209 (H) 10/19/2020   BILITOT 0.7 10/19/2020   GFRNONAA 50 (L) 10/19/2020   GFRAA 54 (L) 02/22/2020     Medications: I have reviewed the patient's current medications.   Assessment/Plan: Multiple liver masses CT abdomen/pelvis 07/11/2018, low-attenuation liver lesions incompletely characterized without contrast Ultrasound abdomen 07/10/2020-innumerable incompletely assessed hypoechoic masses in the liver MRI abdomen  07/29/2020- innumerable hypervascular liver masses, nodular liver contour, 1.5 cm left adrenal mass Biopsy liver lesion 08/08/2020-hepatocellular carcinoma, moderately differentiated Cycle 1 atezolizumab/bevacizumab 08/18/2020 Cycle 2 atezolizumab/bevacizumab 09/07/2020 Cycle 3 atezolizumab/bevacizumab 09/28/2020 Cycle 4 atezolizumab/bevacizumab 10/19/2020 Coronary artery disease Atrial fibrillation-maintained on anticoagulation History of basal cell and squamous cell skin cancers Diabetes Anorexia/weight loss Abdominal pain Urinary tract infection 08/30/2020      Disposition: Stacy [name] appears stable.  She is tolerating the atezolizumab/bevacizumab well.  She has intermittent nosebleeding, likely related to apixaban and bevacizumab.  Her daughter will contact us for increased bleeding.  We will consider holding the apixaban or decreasing the dose.  She will undergo a restaging liver MRI after this cycle.  She will return for an office visit with a plan to continue atezolizumab/bevacizumab on 11/13/2020.  [name], MD  10/19/2020  12:54 PM

## 2020-10-19 NOTE — Telephone Encounter (Signed)
Per Dr Benay Spice OK to treat with AST 173

## 2020-10-19 NOTE — Patient Instructions (Signed)
[facility]  Discharge Instructions: Thank you for choosing [facility] to provide your oncology and hematology care.   If you have a lab appointment with the [facility], please go directly to the [facility] and check in at the registration area.   Wear comfortable clothing and clothing appropriate for easy access to any Portacath or PICC line.   We strive to give you quality time with your provider. You may need to reschedule your appointment if you arrive late (15 or more minutes).  Arriving late affects you and other patients whose appointments are after yours.  Also, if you miss three or more appointments without notifying the office, you may be dismissed from the clinic at the provider's discretion.      For prescription refill requests, have your pharmacy contact our office and allow 72 hours for refills to be completed.    Today you received the following chemotherapy and/or immunotherapy agents AVASTIN and TECENTRIQ      To help prevent nausea and vomiting after your treatment, we encourage you to take your nausea medication as directed.  BELOW ARE SYMPTOMS THAT SHOULD BE REPORTED IMMEDIATELY: *FEVER GREATER THAN 100.4 F (38 C) OR HIGHER *CHILLS OR SWEATING *NAUSEA AND VOMITING THAT IS NOT CONTROLLED WITH YOUR NAUSEA MEDICATION *UNUSUAL SHORTNESS OF BREATH *UNUSUAL BRUISING OR BLEEDING *URINARY PROBLEMS (pain or burning when urinating, or frequent urination) *BOWEL PROBLEMS (unusual diarrhea, constipation, pain near the anus) TENDERNESS IN MOUTH AND THROAT WITH OR WITHOUT PRESENCE OF ULCERS (sore throat, sores in mouth, or a toothache) UNUSUAL RASH, SWELLING OR PAIN  UNUSUAL VAGINAL DISCHARGE OR ITCHING   Items with * indicate a potential emergency and should be followed up as soon as possible or go to the Emergency Department if any problems should occur.  Please show the CHEMOTHERAPY ALERT CARD or IMMUNOTHERAPY ALERT CARD at  check-in to the Emergency Department and triage nurse.  Should you have questions after your visit or need to cancel or reschedule your appointment, please contact [facility]  Dept: [phone]  and follow the prompts.  Office hours are 8:00 a.m. to 4:30 p.m. Monday - Friday. Please note that voicemails left after 4:00 p.m. may not be returned until the following business day.  We are closed weekends and major holidays. You have access to a nurse at all times for urgent questions. Please call the main number to the clinic Dept: [phone] and follow the prompts.   For any non-urgent questions, you may also contact your provider using MyChart. We now offer e-Visits for anyone 69 and older to request care online for non-urgent symptoms. For details visit [email or web address].   Also download the MyChart app! Go to the app store, search "MyChart", open the app, select [facility], and log in with your MyChart username and password.  Due to Covid, a mask is required upon entering the hospital/clinic. If you do not have a mask, one will be given to you upon arrival. For doctor visits, patients may have 1 support person aged 50 or older with them. For treatment visits, patients cannot have anyone with them due to current Covid guidelines and our immunocompromised population.    Bevacizumab injection What is this medication? BEVACIZUMAB (be va SIZ yoo mab) is a monoclonal antibody. It is used to treatmany types of cancer. This medicine may be used for other purposes; ask your health care provider orpharmacist if you have questions. COMMON BRAND NAME(S): Avastin,  MVASI, Noah Charon What should I tell my care team before I take this medication? They need to know if you have any of these conditions: diabetes heart disease high blood pressure history of coughing up blood prior anthracycline chemotherapy (e.g., doxorubicin, daunorubicin, epirubicin) recent or ongoing  radiation therapy recent or planning to have surgery stroke an unusual or allergic reaction to bevacizumab, hamster proteins, mouse proteins, other medicines, foods, dyes, or preservatives pregnant or trying to get pregnant breast-feeding How should I use this medication? This medicine is for infusion into a vein. It is given by a health careprofessional in a hospital or clinic setting. Talk to your pediatrician regarding the use of this medicine in children.Special care may be needed. Overdosage: If you think you have taken too much of this medicine contact apoison control center or emergency room at once. NOTE: This medicine is only for you. Do not share this medicine with others. What if I miss a dose? It is important not to miss your dose. Call your doctor or health careprofessional if you are unable to keep an appointment. What may interact with this medication? Interactions are not expected. This list may not describe all possible interactions. Give your health care provider a list of all the medicines, herbs, non-prescription drugs, or dietary supplements you use. Also tell them if you smoke, drink alcohol, or use illegaldrugs. Some items may interact with your medicine. What should I watch for while using this medication? Your condition will be monitored carefully while you are receiving this medicine. You will need important blood work and urine testing done while youare taking this medicine. This medicine may increase your risk to bruise or bleed. Call your doctor orhealth care professional if you notice any unusual bleeding. Before having surgery, talk to your health care provider to make sure it is ok. This drug can increase the risk of poor healing of your surgical site or wound. You will need to stop this drug for 28 days before surgery. After surgery, wait at least 28 days before restarting this drug. Make sure the surgical site or wound is healed enough before restarting this drug.  Talk to your health careprovider if questions. Do not become pregnant while taking this medicine or for 6 months after stopping it. Women should inform their doctor if they wish to become pregnant or think they might be pregnant. There is a potential for serious side effects to an unborn child. Talk to your health care professional or pharmacist for more information. Do not breast-feed an infant while taking this medicine andfor 6 months after the last dose. This medicine has caused ovarian failure in some women. This medicine may interfere with the ability to have a child. You should talk to your doctor orhealth care professional if you are concerned about your fertility. What side effects may I notice from receiving this medication? Side effects that you should report to your doctor or health care professionalas soon as possible: allergic reactions like skin rash, itching or hives, swelling of the face, lips, or tongue chest pain or chest tightness chills coughing up blood high fever seizures severe constipation signs and symptoms of bleeding such as bloody or black, tarry stools; red or dark-Rhody urine; spitting up blood or K[place] material that looks like coffee grounds; red spots on the skin; unusual bruising or bleeding from the eye, gums, or nose signs and symptoms of a blood clot such as breathing problems; chest pain; severe, sudden headache; pain, swelling, warmth in the leg  signs and symptoms of a stroke like changes in vision; confusion; trouble speaking or understanding; severe headaches; sudden numbness or weakness of the face, arm or leg; trouble walking; dizziness; loss of balance or coordination stomach pain sweating swelling of legs or ankles vomiting weight gain Side effects that usually do not require medical attention (report to yourdoctor or health care professional if they continue or are bothersome): back pain changes in taste decreased appetite dry  skin nausea tiredness This list may not describe all possible side effects. Call your doctor for medical advice about side effects. You may report side effects to FDA at1-800-FDA-1088. Where should I keep my medication? This drug is given in a hospital or clinic and will not be stored at home. NOTE: This sheet is a summary. It may not cover all possible information. If you have questions about this medicine, talk to your doctor, pharmacist, orhealth care provider.  2022 Elsevier/Gold Standard (2018-11-25 10:50:46)  Atezolizumab injection What is this medication? ATEZOLIZUMAB (a te zoe LIZ ue mab) is a monoclonal antibody. It is used to treat bladder cancer (urothelial cancer), liver cancer, lung cancer, andmelanoma. This medicine may be used for other purposes; ask your health care provider orpharmacist if you have questions. COMMON BRAND NAME(S): Tecentriq What should I tell my care team before I take this medication? They need to know if you have any of these conditions: autoimmune diseases like Crohn's disease, ulcerative colitis, or lupus have had or planning to have an allogeneic stem cell transplant (uses someone else's stem cells) history of organ transplant history of radiation to the chest nervous system problems like myasthenia gravis or Guillain-Barre syndrome an unusual or allergic reaction to atezolizumab, other medicines, foods, dyes, or preservatives pregnant or trying to get pregnant breast-feeding How should I use this medication? This medicine is for infusion into a vein. It is given by a health careprofessional in a hospital or clinic setting. A special MedGuide will be given to you before each treatment. Be sure to readthis information carefully each time. Talk to your pediatrician regarding the use of this medicine in children.Special care may be needed. Overdosage: If you think you have taken too much of this medicine contact apoison control center or emergency room  at once. NOTE: This medicine is only for you. Do not share this medicine with others. What if I miss a dose? It is important not to miss your dose. Call your doctor or health careprofessional if you are unable to keep an appointment. What may interact with this medication? Interactions have not been studied. This list may not describe all possible interactions. Give your health care provider a list of all the medicines, herbs, non-prescription drugs, or dietary supplements you use. Also tell them if you smoke, drink alcohol, or use illegaldrugs. Some items may interact with your medicine. What should I watch for while using this medication? Your condition will be monitored carefully while you are receiving thismedicine. You may need blood work done while you are taking this medicine. Do not become pregnant while taking this medicine or for at least 5 months after stopping it. Women should inform their doctor if they wish to become pregnant or think they might be pregnant. There is a potential for serious side effects to an unborn child. Talk to your health care professional or pharmacist for more information. Do not breast-feed an infant while taking this medicineor for at least 5 months after the last dose. What side effects may I notice from receiving  this medication? Side effects that you should report to your doctor or health care professionalas soon as possible: allergic reactions like skin rash, itching or hives, swelling of the face, lips, or tongue black, tarry stools bloody or watery diarrhea breathing problems changes in vision chest pain or chest tightness chills facial flushing fever headache signs and symptoms of high blood sugar such as dizziness; dry mouth; dry skin; fruity breath; nausea; stomach pain; increased hunger or thirst; increased urination signs and symptoms of liver injury like dark yellow or brown urine; general ill feeling or flu-like symptoms; light-colored  stools; loss of appetite; nausea; right upper belly pain; unusually weak or tired; yellowing of the eyes or skin stomach pain trouble passing urine or change in the amount of urine Side effects that usually do not require medical attention (report to yourdoctor or health care professional if they continue or are bothersome): bone pain cough diarrhea joint pain muscle pain muscle weakness swelling of arms or legs tiredness weight loss This list may not describe all possible side effects. Call your doctor for medical advice about side effects. You may report side effects to FDA at1-800-FDA-1088. Where should I keep my medication? This drug is given in a hospital or clinic and will not be stored at home. NOTE: This sheet is a summary. It may not cover all possible information. If you have questions about this medicine, talk to your doctor, pharmacist, orhealth care provider.  2022 Elsevier/Gold Standard (2019-10-28 13:59:34)

## 2020-10-19 NOTE — Progress Notes (Signed)
Patient presents for treatment. RN assessment completed along with the following:  Labs reviewed - Yes, and note in for ok to treat with AST 173. Vitals reviewed including weight - Yes, and within treatment parameters Oncology Treatment Attestation completed for current therapy- Yes, on date 08/12/20 Consent completed and reflects current therapy/intent - Yes, on date 08/18/20 Provider progress note reviewed - Yes, today's progress note was reviewed. Treatment plan/orders reviewed - Yes, and there are no adjustments needed to today's treatment. Previous treatment date reviewed - Yes, and the appropriate amount of time has elapsed between treatments. Clinic Hand Off Received from - Lenox Ponds, LPN.  Patient to proceed with treatment.

## 2020-10-19 NOTE — Progress Notes (Signed)
CRITICAL VALUE STICKER  CRITICAL VALUE:AST 173 (prior 233 8/18)  RECEIVER (on-site recipient of call):Birgitta Uhlir,RN  DATE & TIME NOTIFIED:10/19/20 @ 1203   MESSENGER (representative from lab):Phyllis  MD NOTIFIED: Dr. Benay Spice  TIME OF NOTIFICATION:1208  RESPONSE:

## 2020-10-24 ENCOUNTER — Other Ambulatory Visit: Payer: Medicare Other

## 2020-10-25 ENCOUNTER — Other Ambulatory Visit: Payer: Self-pay

## 2020-10-25 ENCOUNTER — Other Ambulatory Visit: Payer: Self-pay | Admitting: Legal Medicine

## 2020-10-25 ENCOUNTER — Other Ambulatory Visit (INDEPENDENT_AMBULATORY_CARE_PROVIDER_SITE_OTHER): Payer: Medicare Other

## 2020-10-25 DIAGNOSIS — C220 Liver cell carcinoma: Secondary | ICD-10-CM

## 2020-10-25 DIAGNOSIS — R300 Dysuria: Secondary | ICD-10-CM | POA: Diagnosis not present

## 2020-10-25 DIAGNOSIS — N3001 Acute cystitis with hematuria: Secondary | ICD-10-CM

## 2020-10-25 LAB — POCT URINALYSIS DIP (CLINITEK)
Bilirubin, UA: NEGATIVE
Blood, UA: NEGATIVE
Glucose, UA: NEGATIVE mg/dL
Ketones, POC UA: NEGATIVE mg/dL
Nitrite, UA: POSITIVE — AB
POC PROTEIN,UA: 30 — AB
Spec Grav, UA: 1.015 (ref 1.010–1.025)
Urobilinogen, UA: 0.2 E.U./dL
pH, UA: 6 (ref 5.0–8.0)

## 2020-10-25 NOTE — Progress Notes (Signed)
Culture lp

## 2020-10-26 ENCOUNTER — Encounter: Payer: Self-pay | Admitting: *Deleted

## 2020-10-26 ENCOUNTER — Encounter: Payer: Self-pay | Admitting: Oncology

## 2020-10-26 ENCOUNTER — Ambulatory Visit: Payer: Medicare Other | Admitting: Legal Medicine

## 2020-10-26 ENCOUNTER — Telehealth: Payer: Self-pay

## 2020-10-26 LAB — COMPREHENSIVE METABOLIC PANEL
ALT: 123 IU/L — ABNORMAL HIGH (ref 0–32)
AST: 259 IU/L (ref 0–40)
Albumin/Globulin Ratio: 1.2 (ref 1.2–2.2)
Albumin: 3.3 g/dL — ABNORMAL LOW (ref 3.5–4.6)
Alkaline Phosphatase: 269 IU/L — ABNORMAL HIGH (ref 44–121)
BUN/Creatinine Ratio: 14 (ref 12–28)
BUN: 14 mg/dL (ref 10–36)
Bilirubin Total: 0.6 mg/dL (ref 0.0–1.2)
CO2: 23 mmol/L (ref 20–29)
Calcium: 9.5 mg/dL (ref 8.7–10.3)
Chloride: 100 mmol/L (ref 96–106)
Creatinine, Ser: 1.02 mg/dL — ABNORMAL HIGH (ref 0.57–1.00)
Globulin, Total: 2.8 g/dL (ref 1.5–4.5)
Glucose: 142 mg/dL — ABNORMAL HIGH (ref 65–99)
Potassium: 4.8 mmol/L (ref 3.5–5.2)
Sodium: 139 mmol/L (ref 134–144)
Total Protein: 6.1 g/dL (ref 6.0–8.5)
eGFR: 52 mL/min/{1.73_m2} — ABNORMAL LOW (ref >59)

## 2020-10-26 NOTE — Progress Notes (Signed)
Glucose 142, eGFR 52, liver tests elevated from chemotherapy,  lp

## 2020-10-26 NOTE — Progress Notes (Addendum)
CRITICAL VALUE STICKER  CRITICAL VALUE:AST 74  RECEIVER (on-site recipient of call):Tyreanna Bisesi,RN  DATE & TIME NOTIFIED: 10/26/20 @ 1545  MESSENGER (representative from lab):Nurse from PCP office  MD NOTIFIED: Dr. Benay Spice  TIME OF NOTIFICATION:1556  RESPONSE: OK. No new orders.

## 2020-10-26 NOTE — Telephone Encounter (Signed)
I gave the results of critical labs of AST, Alkaline phosphatase (10/25/2020) to nurse of Dr Benay Spice.

## 2020-10-27 ENCOUNTER — Encounter: Payer: Self-pay | Admitting: Legal Medicine

## 2020-10-27 ENCOUNTER — Other Ambulatory Visit: Payer: Self-pay

## 2020-10-27 ENCOUNTER — Ambulatory Visit (INDEPENDENT_AMBULATORY_CARE_PROVIDER_SITE_OTHER): Payer: Medicare Other | Admitting: Legal Medicine

## 2020-10-27 VITALS — BP 130/60 | HR 71 | Temp 97.7°F | Resp 16 | Ht 62.0 in | Wt 133.0 lb

## 2020-10-27 DIAGNOSIS — E1121 Type 2 diabetes mellitus with diabetic nephropathy: Secondary | ICD-10-CM | POA: Diagnosis not present

## 2020-10-27 DIAGNOSIS — Z7901 Long term (current) use of anticoagulants: Secondary | ICD-10-CM

## 2020-10-27 DIAGNOSIS — F321 Major depressive disorder, single episode, moderate: Secondary | ICD-10-CM | POA: Diagnosis not present

## 2020-10-27 DIAGNOSIS — I152 Hypertension secondary to endocrine disorders: Secondary | ICD-10-CM | POA: Diagnosis not present

## 2020-10-27 DIAGNOSIS — E669 Obesity, unspecified: Secondary | ICD-10-CM | POA: Diagnosis not present

## 2020-10-27 DIAGNOSIS — I2510 Atherosclerotic heart disease of native coronary artery without angina pectoris: Secondary | ICD-10-CM | POA: Diagnosis not present

## 2020-10-27 DIAGNOSIS — R5383 Other fatigue: Secondary | ICD-10-CM

## 2020-10-27 DIAGNOSIS — I69354 Hemiplegia and hemiparesis following cerebral infarction affecting left non-dominant side: Secondary | ICD-10-CM | POA: Diagnosis not present

## 2020-10-27 DIAGNOSIS — I480 Paroxysmal atrial fibrillation: Secondary | ICD-10-CM | POA: Diagnosis not present

## 2020-10-27 DIAGNOSIS — N1831 Chronic kidney disease, stage 3a: Secondary | ICD-10-CM | POA: Diagnosis not present

## 2020-10-27 DIAGNOSIS — C220 Liver cell carcinoma: Secondary | ICD-10-CM | POA: Diagnosis not present

## 2020-10-27 DIAGNOSIS — E1169 Type 2 diabetes mellitus with other specified complication: Secondary | ICD-10-CM | POA: Diagnosis not present

## 2020-10-27 DIAGNOSIS — K2100 Gastro-esophageal reflux disease with esophagitis, without bleeding: Secondary | ICD-10-CM | POA: Diagnosis not present

## 2020-10-27 DIAGNOSIS — H906 Mixed conductive and sensorineural hearing loss, bilateral: Secondary | ICD-10-CM

## 2020-10-27 DIAGNOSIS — I10 Essential (primary) hypertension: Secondary | ICD-10-CM

## 2020-10-27 DIAGNOSIS — E1159 Type 2 diabetes mellitus with other circulatory complications: Secondary | ICD-10-CM

## 2020-10-27 NOTE — Progress Notes (Signed)
Established Patient Office Visit  Subjective:  Patient ID: Stacy [name], female    DOB: 09-21-1926  Age: 87 y.o. MRN: [id number]  CC:  Chief Complaint  Patient presents with   Hyperlipidemia   Hypertension     HPI Stacy [name] presents for chronic follow up  Patient presents for follow up of hypertension.  Patient tolerating metoprolol well with side effects.  Patient was diagnosed with hypertension 2010 so has been treated for hypertension for 10 years.Patient is working on maintaining diet and exercise regimen and follows up as directed. Complication include none.   Patient presents with hyperlipidemia.  Compliance with treatment has been good; patient takes medicines as directed, maintains low cholesterol diet, follows up as directed, and maintains exercise regimen.  Patient is using crestor without problems.   Patient present with type 2 diabetes.  Specifically, this is type 2, noninsulin requiring diabetes, complicated by hypertension and hypercholesterolemia.  Compliance with treatment has been good; patient take medicines as directed, maintains diet and exercise regimen, follows up as directed, and is keeping glucose diary.  Date of  diagnosis 2010.  Depression screen has been performed.Tobacco screen nonsmoking. Current medicines for diabetes metformin.  Patient is on none for renal protection and crestor for cholesterol control.  Patient performs foot exams daily and last ophthalmologic exam was yes.  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 on chemotherapy atezolizmub, bevacizumab   Past Medical History:  Diagnosis Date   Anxiety    Hyperlipidemia     Past Surgical History:  Procedure Laterality Date   3 epidural steroid injections     APPENDECTOMY     Basal Cell Cancer Removal     forehead   CATARACT EXTRACTION     CATARACT EXTRACTION  12/29/2003   CORONARY ANGIOPLASTY  03-15-1995   CORONARY ANGIOPLASTY WITH STENT PLACEMENT  05-19-1998   CORONARY STENT PLACEMENT   2000   DILATION AND CURETTAGE OF UTERUS     ENDARTERECTOMY Right 04/13/2015   Procedure: ENDARTERECTOMY CAROTID;  Surgeon: Serafina Mitchell, MD;  Location: [place] OR;  Service: Vascular;  Laterality: Right;   ENDOMETRIAL BIOPSY     EYE SURGERY     heart catherization     HEMORRHOID SURGERY     INCISIONAL HERNIA REPAIR     INGUINAL HERNIA REPAIR     LOOP RECORDER INSERTION N/A 06/24/2016   Procedure: Loop Recorder Insertion;  Surgeon: Deboraha Sprang, MD;  Location: [place] [facility];  Service: Cardiovascular;  Laterality: N/A;   PATCH ANGIOPLASTY Right 04/13/2015   Procedure: PATCH ANGIOPLASTY;  Surgeon: Serafina Mitchell, MD;  Location: [facility] OR;  Service: Vascular;  Laterality: Right;   PTCA     SQUAMOUS CELL CARCINOMA EXCISION     left lower leg   TEE WITHOUT CARDIOVERSION N/A 06/24/2016   Procedure: TRANSESOPHAGEAL ECHOCARDIOGRAM (TEE);  Surgeon: Dorothy Spark, MD;  Location: [facility] ENDOSCOPY;  Service: Cardiovascular;  Laterality: N/A;   TONSILLECTOMY      Family History  Problem Relation Age of Onset   Diabetes Father    Prostate cancer Father        meta to colon   Cancer Father        Prostate   Parkinsonism Mother    Stroke Paternal Uncle    Hypertension Paternal Uncle    Heart attack Neg Hx     Social History   Socioeconomic History   Marital status: Widowed    Spouse name: Not on file   Number  of children: 1   Years of education: Not on file   Highest education level: Not on file  Occupational History   Occupation: Retired    Fish farm manager: RETIRED    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  Tobacco Use   Smoking status: Former    Packs/day: 1.00    Years: 54.00    Pack years: 54.00    Types: Cigarettes    Quit date: 10/05/1998    Years since quitting: 22.0   Smokeless tobacco: Never  Vaping Use   Vaping Use: Never used  Substance and Sexual Activity   Alcohol use: Not Currently    Alcohol/week: 0.0 standard drinks   Drug use: Never   Sexual activity: Not Currently  Other Topics  Concern   Not on file  Social History Narrative   Not on file   Social Determinants of Health   Financial Resource Strain: Not on file  Food Insecurity: Not on file  Transportation Needs: Not on file  Physical Activity: Not on file  Stress: Not on file  Social Connections: Not on file  Intimate Partner Violence: Not on file    Outpatient Medications Prior to Visit  Medication Sig Dispense Refill   ACCU-CHEK SMARTVIEW test strip 1 each by Other route as needed for other (blood sugar test strips).   11   apixaban (ELIQUIS) 5 MG TABS tablet Take 1 tablet (5 mg total) by mouth 2 (two) times daily. 180 tablet 1   cetirizine (ZYRTEC) 10 MG tablet TAKE 1 TABLET BY MOUTH EVERY DAY 90 tablet 2   diphenhydrAMINE (BENADRYL) 25 MG tablet Take 25 mg by mouth every 6 (six) hours as needed.     Famotidine-Ca Carb-Mag Hydrox (PEPCID COMPLETE PO) Take by mouth.     linaclotide (LINZESS) 72 MCG capsule Take 72 mcg by mouth daily. As needed     metFORMIN (GLUCOPHAGE) 500 MG tablet Take 500 mg by mouth daily with breakfast. Taking BID     metoprolol succinate (TOPROL XL) 25 MG 24 hr tablet Take 1 tablet (25 mg total) by mouth in the morning and at bedtime. 180 tablet 3   mometasone (ELOCON) 0.1 % cream mometasone 0.1 % topical cream  APPLY TO AFFECTED AREA EVERY DAY AS NEEDED ITCHING     NEOMYCIN-POLYMYXIN-HYDROCORTISONE (CORTISPORIN) 1 % SOLN otic solution Place 2 drops into both ears as directed.  3   nitroGLYCERIN (NITROSTAT) 0.4 MG SL tablet Place 1 tablet (0.4 mg total) under the tongue every 5 (five) minutes as needed for chest pain. 25 tablet 3   ondansetron (ZOFRAN ODT) 4 MG disintegrating tablet Take 1 tablet (4 mg total) by mouth every 8 (eight) hours as needed for nausea or vomiting. 30 tablet 3   rosuvastatin (CRESTOR) 10 MG tablet TAKE 1 TABLET BY MOUTH EVERY DAY 90 tablet 3   sertraline (ZOLOFT) 50 MG tablet TAKE 1 TABLET BY MOUTH EVERY DAY 90 tablet 2   traMADol (ULTRAM) 50 MG tablet Take  0.5-1 tablets (25-50 mg total) by mouth every 12 (twelve) hours as needed. 30 tablet 0   No facility-administered medications prior to visit.    Allergies  Allergen Reactions   Iodinated Diagnostic Agents Anaphylaxis and Rash   Iohexol Other (See Comments)     Desc: HIVES- 13 HR PRE-MEDS ARE REQUIRED-ASM- 03/21/05,SULFA,ADHESIVE TAPE    Sulfonamide Derivatives Hives    REACTION: hives   Latex Other (See Comments)    Adhesive tape and EKG adhesive leads to rash   Tranilast Hives  Investigational medication   Metformin Hcl     Other reaction(s): diarrhea, anorexia (at higher doses)   Other     Other reaction(s): Unknown   Tape Other (See Comments)    Adhesive tap redness   Wound Dressing Adhesive     Other reaction(s): Unknown   Sulfa Antibiotics Rash    ROS Review of Systems  Constitutional:  Negative for activity change and appetite change.  HENT:  Negative for congestion.   Eyes:  Negative for visual disturbance.  Respiratory:  Negative for chest tightness and shortness of breath.   Cardiovascular:  Negative for chest pain, palpitations and leg swelling.  Gastrointestinal:  Negative for abdominal distention and abdominal pain.  Endocrine: Negative for polyuria.  Genitourinary:  Negative for difficulty urinating and dysuria.  Musculoskeletal:  Negative for arthralgias and back pain.     Objective:    Physical Exam Vitals reviewed.  Constitutional:      General: She is not in acute distress.    Appearance: Normal appearance.  HENT:     Head: Normocephalic.     Right Ear: Tympanic membrane, ear canal and external ear normal.     Left Ear: Tympanic membrane, ear canal and external ear normal.  Eyes:     Extraocular Movements: Extraocular movements intact.     Conjunctiva/sclera: Conjunctivae normal.     Pupils: Pupils are equal, round, and reactive to light.  Cardiovascular:     Rate and Rhythm: Normal rate and regular rhythm.     Pulses: Normal pulses.      Heart sounds: Normal heart sounds. No murmur heard.   No gallop.  Pulmonary:     Effort: Pulmonary effort is normal. No respiratory distress.     Breath sounds: Normal breath sounds. No wheezing.  Abdominal:     General: Abdomen is flat. Bowel sounds are normal. There is no distension.     Palpations: Abdomen is soft.     Tenderness: There is no abdominal tenderness.  Musculoskeletal:        General: Normal range of motion.     Cervical back: Normal range of motion.  Skin:    General: Skin is warm.     Capillary Refill: Capillary refill takes less than 2 seconds.  Neurological:     General: No focal deficit present.     Mental Status: She is alert and oriented to person, place, and time. Mental status is at baseline.  Psychiatric:        Mood and Affect: Mood normal.    BP 130/60   Pulse 71   Temp 97.7 F (36.5 C)   Resp 16   Ht $R'5\' 2"'fw$  (1.575 m)   Wt 133 lb (60.3 kg)   SpO2 96%   BMI 24.33 kg/m  Wt Readings from Last 3 Encounters:  10/27/20 133 lb (60.3 kg)  10/19/20 131 lb (59.4 kg)  09/28/20 130 lb 3.2 oz (59.1 kg)     Health Maintenance Due  Topic Date Due   TETANUS/TDAP  Never done   Zoster Vaccines- Shingrix (1 of 2) Never done   COVID-19 Vaccine (4 - Booster for Pfizer series) 03/09/2020   INFLUENZA VACCINE  09/11/2020    There are no preventive care reminders to display for this patient.  Lab Results  Component Value Date   TSH 4.210 10/27/2020   Lab Results  Component Value Date   WBC 10.3 10/27/2020   HGB 12.2 10/27/2020   HCT 39.5 10/27/2020   MCV 81  10/27/2020   PLT 321 10/27/2020   Lab Results  Component Value Date   NA 139 10/25/2020   K 4.8 10/25/2020   CO2 23 10/25/2020   GLUCOSE 142 (H) 10/25/2020   BUN 14 10/25/2020   CREATININE 1.02 (H) 10/25/2020   BILITOT 0.6 10/25/2020   ALKPHOS 269 (H) 10/25/2020   AST 259 (HH) 10/25/2020   ALT 123 (H) 10/25/2020   PROT 6.1 10/25/2020   ALBUMIN 3.3 (L) 10/25/2020   CALCIUM 9.5 10/25/2020    ANIONGAP 12 10/19/2020   EGFR 52 (L) 10/25/2020   GFR 56.18 (L) 01/16/2012   Lab Results  Component Value Date   CHOL 125 10/27/2020   Lab Results  Component Value Date   HDL 35 (L) 10/27/2020   Lab Results  Component Value Date   LDLCALC 66 10/27/2020   Lab Results  Component Value Date   TRIG 134 10/27/2020   Lab Results  Component Value Date   CHOLHDL 3.6 10/27/2020   Lab Results  Component Value Date   HGBA1C 7.0 (H) 10/27/2020      Assessment & Plan:   Problem List Items Addressed This Visit       Cardiovascular and Mediastinum   Essential hypertension An individual hypertension care plan was established and reinforced today.  The patient's status was assessed using clinical findings on exam and labs or diagnostic tests. The patient's success at meeting treatment goals on disease specific evidence-based guidelines and found to be well controlled. SELF MANAGEMENT: The patient and I together assessed ways to personally work towards obtaining the recommended goals. RECOMMENDATIONS: avoid decongestants found in common cold remedies, decrease consumption of alcohol, perform routine monitoring of BP with home BP cuff, exercise, reduction of dietary salt, take medicines as prescribed, try not to miss doses and quit smoking.  Regular exercise and maintaining a healthy weight is needed.  Stress reduction may help. A CLINICAL SUMMARY including written plan identify barriers to care unique to individual due to social or financial issues.  We attempt to mutually creat solutions for individual and family understanding.     Coronary atherosclerosis An individual plan was formulated based on patient history and exam, labs and evidence based data. Patient has not had recent angina or nitroglycerin use. continue present treatment.     Paroxysmal atrial fibrillation [facility]) Patient has a diagnosis of paroxysmal atrial fibrillation.   Patient is on eliquis and has controlled  ventricular response.  Patient is CV stable.     Obesity, diabetes, and hypertension syndrome ([place]) - Primary   Relevant Orders   Lipid panel (Completed)   CBC with Differential/Platelet (Completed) An individual care plan for diabetes was established and reinforced today.  The patient's status was assessed using clinical findings on exam, labs and diagnostic testing. Patient success at meeting goals based on disease specific evidence-based guidelines and found to be good controlled. Medications were assessed and patient's understanding of the medical issues , including barriers were assessed. Recommend adherence to a diabetic diet, a graduated exercise program, HgbA1c level is checked quarterly, and urine microalbumin performed yearly .  Annual mono-filament sensation testing performed. Lower blood pressure and control hyperlipidemia is important. Get annual eye exams and annual flu shots and smoking cessation discussed.  Self management goals were discussed.      Digestive   GASTROESOPHAGEAL REFLUX DISEASE Plan of care was formulated today.  She is doing well.  A plan of care was formulated using patient exam, tests and other sources to optimize  care using evidence based information.  Recommend no smoking, no eating after supper, avoid fatty foods, elevate Head of bed, avoid tight fitting clothing.  Continue on omeprazole.     Hepatocellular carcinoma [facility]) Patient just had her first infusion of immunotherapy and doing well     Endocrine   Diabetic glomerulopathy ([place])   Relevant Orders   Hemoglobin A1c (Completed) An individual care plan for diabetes was established and reinforced today.  The patient's status was assessed using clinical findings on exam, labs and diagnostic testing. Patient success at meeting goals based on disease specific evidence-based guidelines and found to be good controlled. Medications were assessed and patient's understanding of the medical issues , including  barriers were assessed. Recommend adherence to a diabetic diet, a graduated exercise program, HgbA1c level is checked quarterly, and urine microalbumin performed yearly .  Annual mono-filament sensation testing performed. Lower blood pressure and control hyperlipidemia is important. Get annual eye exams and annual flu shots and smoking cessation discussed.  Self management goals were discussed.      Nervous and Auditory   Mixed conductive and sensorineural hearing loss, bilateral Patient is wearing hearing aids    Hemiparesis affecting left side as late effect of cerebrovascular accident [facility]) She still is weak on left side and uses walker or rollator     Genitourinary   Chronic kidney disease, stage 3a ([place]) Kidney disease is stable   Other Visit Diagnoses     Long term (current) use of anticoagulants     Patient on eliquis for PAF Acquired thrombophilia For [place] of elequis for PAF    Depression, major, single episode, moderate (HCC)     Depression is stable , no changes in mediicnes    Fatigue, unspecified type       Relevant Orders   TSH (Completed) Patient has fatigue but has active hepatobiliary carcinoma      30 minute visit as well as discussion of chemotherapy    Follow-up: Return in about 4 months (around 02/26/2021) for fasting.    Reinaldo Meeker, MD

## 2020-10-28 LAB — CBC WITH DIFFERENTIAL/PLATELET
Basophils Absolute: 0.1 10*3/uL (ref 0.0–0.2)
Basos: 1 %
EOS (ABSOLUTE): 0.6 10*3/uL — ABNORMAL HIGH (ref 0.0–0.4)
Eos: 5 %
Hematocrit: 39.5 % (ref 34.0–46.6)
Hemoglobin: 12.2 g/dL (ref 11.1–15.9)
Immature Grans (Abs): 0 10*3/uL (ref 0.0–0.1)
Immature Granulocytes: 0 %
Lymphocytes Absolute: 3.7 10*3/uL — ABNORMAL HIGH (ref 0.7–3.1)
Lymphs: 36 %
MCH: 24.9 pg — ABNORMAL LOW (ref 26.6–33.0)
MCHC: 30.9 g/dL — ABNORMAL LOW (ref 31.5–35.7)
MCV: 81 fL (ref 79–97)
Monocytes Absolute: 1.1 10*3/uL — ABNORMAL HIGH (ref 0.1–0.9)
Monocytes: 11 %
Neutrophils Absolute: 4.9 10*3/uL (ref 1.4–7.0)
Neutrophils: 47 %
Platelets: 321 10*3/uL (ref 150–450)
RBC: 4.89 x10E6/uL (ref 3.77–5.28)
RDW: 16.5 % — ABNORMAL HIGH (ref 11.7–15.4)
WBC: 10.3 10*3/uL (ref 3.4–10.8)

## 2020-10-28 LAB — CARDIOVASCULAR RISK ASSESSMENT

## 2020-10-28 LAB — LIPID PANEL
Chol/HDL Ratio: 3.6 ratio (ref 0.0–4.4)
Cholesterol, Total: 125 mg/dL (ref 100–199)
HDL: 35 mg/dL — ABNORMAL LOW
LDL Chol Calc (NIH): 66 mg/dL (ref 0–99)
Triglycerides: 134 mg/dL (ref 0–149)
VLDL Cholesterol Cal: 24 mg/dL (ref 5–40)

## 2020-10-28 LAB — HEMOGLOBIN A1C
Est. average glucose Bld gHb Est-mCnc: 154 mg/dL
Hgb A1c MFr Bld: 7 % — ABNORMAL HIGH (ref 4.8–5.6)

## 2020-10-28 LAB — TSH: TSH: 4.21 u[IU]/mL (ref 0.450–4.500)

## 2020-10-29 NOTE — Progress Notes (Signed)
Cholesterol normal, lymphocytes high related to chemotherapy, A!C 7.0 good, TSH 4.2 normal lp

## 2020-10-31 ENCOUNTER — Other Ambulatory Visit: Payer: Self-pay | Admitting: Legal Medicine

## 2020-10-31 DIAGNOSIS — N3000 Acute cystitis without hematuria: Secondary | ICD-10-CM

## 2020-10-31 LAB — URINE CULTURE

## 2020-10-31 MED ORDER — NITROFURANTOIN MONOHYD MACRO 100 MG PO CAPS: 100.0000 mg | ORAL_CAPSULE | Freq: Two times a day (BID) | ORAL | 0 refills | Status: DC

## 2020-10-31 NOTE — Progress Notes (Signed)
Urine culture klebsiella pneumoniae sensitive to macrobid lp

## 2020-11-05 ENCOUNTER — Other Ambulatory Visit: Payer: Self-pay | Admitting: Oncology

## 2020-11-07 ENCOUNTER — Other Ambulatory Visit: Payer: Self-pay

## 2020-11-07 ENCOUNTER — Ambulatory Visit
Admission: RE | Admit: 2020-11-07 | Discharge: 2020-11-07 | Disposition: A | Discharge status: Home / Self Care | Payer: Medicare Other | Source: Ambulatory Visit | Attending: Oncology | Admitting: Oncology

## 2020-11-07 DIAGNOSIS — C220 Liver cell carcinoma: Secondary | ICD-10-CM

## 2020-11-07 DIAGNOSIS — K7689 Other specified diseases of liver: Secondary | ICD-10-CM | POA: Diagnosis not present

## 2020-11-07 DIAGNOSIS — R160 Hepatomegaly, not elsewhere classified: Secondary | ICD-10-CM | POA: Diagnosis not present

## 2020-11-07 DIAGNOSIS — I700 Atherosclerosis of aorta: Secondary | ICD-10-CM | POA: Diagnosis not present

## 2020-11-07 MED ORDER — GADOBENATE DIMEGLUMINE 529 MG/ML IV SOLN
12.0000 mL | Freq: Once | INTRAVENOUS | Status: AC | PRN
  Administered 2020-11-07: 12 mL via INTRAVENOUS

## 2020-11-08 ENCOUNTER — Other Ambulatory Visit: Payer: Self-pay

## 2020-11-08 DIAGNOSIS — C220 Liver cell carcinoma: Secondary | ICD-10-CM

## 2020-11-10 ENCOUNTER — Inpatient Hospital Stay (HOSPITAL_BASED_OUTPATIENT_CLINIC_OR_DEPARTMENT_OTHER): Payer: Medicare Other | Admitting: Oncology

## 2020-11-10 ENCOUNTER — Inpatient Hospital Stay: Payer: Medicare Other

## 2020-11-10 ENCOUNTER — Other Ambulatory Visit: Payer: Self-pay

## 2020-11-10 VITALS — BP 119/64 | HR 71 | Temp 98.7°F | Resp 18 | Ht 62.0 in | Wt 131.0 lb

## 2020-11-10 DIAGNOSIS — I2510 Atherosclerotic heart disease of native coronary artery without angina pectoris: Secondary | ICD-10-CM | POA: Diagnosis not present

## 2020-11-10 DIAGNOSIS — R300 Dysuria: Secondary | ICD-10-CM | POA: Diagnosis not present

## 2020-11-10 DIAGNOSIS — C220 Liver cell carcinoma: Secondary | ICD-10-CM

## 2020-11-10 DIAGNOSIS — E119 Type 2 diabetes mellitus without complications: Secondary | ICD-10-CM | POA: Diagnosis not present

## 2020-11-10 DIAGNOSIS — I4891 Unspecified atrial fibrillation: Secondary | ICD-10-CM | POA: Diagnosis not present

## 2020-11-10 DIAGNOSIS — Z5112 Encounter for antineoplastic immunotherapy: Secondary | ICD-10-CM | POA: Diagnosis not present

## 2020-11-10 LAB — CBC WITH DIFFERENTIAL (CANCER CENTER ONLY)
Abs Immature Granulocytes: 0.02 10*3/uL (ref 0.00–0.07)
Basophils Absolute: 0.1 10*3/uL (ref 0.0–0.1)
Basophils Relative: 1 %
Eosinophils Absolute: 0.6 10*3/uL — ABNORMAL HIGH (ref 0.0–0.5)
Eosinophils Relative: 7 %
HCT: 37.5 % (ref 36.0–46.0)
Hemoglobin: 11.4 g/dL — ABNORMAL LOW (ref 12.0–15.0)
Immature Granulocytes: 0 %
Lymphocytes Relative: 35 %
Lymphs Abs: 3.3 10*3/uL (ref 0.7–4.0)
MCH: 25 pg — ABNORMAL LOW (ref 26.0–34.0)
MCHC: 30.4 g/dL (ref 30.0–36.0)
MCV: 82.2 fL (ref 80.0–100.0)
Monocytes Absolute: 1 10*3/uL (ref 0.1–1.0)
Monocytes Relative: 10 %
Neutro Abs: 4.4 10*3/uL (ref 1.7–7.7)
Neutrophils Relative %: 47 %
Platelet Count: 257 10*3/uL (ref 150–400)
RBC: 4.56 MIL/uL (ref 3.87–5.11)
RDW: 17.3 % — ABNORMAL HIGH (ref 11.5–15.5)
WBC Count: 9.3 10*3/uL (ref 4.0–10.5)
nRBC: 0 % (ref 0.0–0.2)

## 2020-11-10 LAB — CMP (CANCER CENTER ONLY)
ALT: 97 U/L — ABNORMAL HIGH (ref 0–44)
AST: 163 U/L — ABNORMAL HIGH (ref 15–41)
Albumin: 3.3 g/dL — ABNORMAL LOW (ref 3.5–5.0)
Alkaline Phosphatase: 226 U/L — ABNORMAL HIGH (ref 38–126)
Anion gap: 9 (ref 5–15)
BUN: 15 mg/dL (ref 8–23)
CO2: 25 mmol/L (ref 22–32)
Calcium: 9.3 mg/dL (ref 8.9–10.3)
Chloride: 106 mmol/L (ref 98–111)
Creatinine: 0.92 mg/dL (ref 0.44–1.00)
GFR, Estimated: 58 mL/min — ABNORMAL LOW (ref >60)
Glucose, Bld: 135 mg/dL — ABNORMAL HIGH (ref 70–99)
Potassium: 4.4 mmol/L (ref 3.5–5.1)
Sodium: 140 mmol/L (ref 135–145)
Total Bilirubin: 0.7 mg/dL (ref 0.3–1.2)
Total Protein: 6.4 g/dL — ABNORMAL LOW (ref 6.5–8.1)

## 2020-11-10 LAB — TOTAL PROTEIN, URINE DIPSTICK: Protein, ur: 30 mg/dL — AB

## 2020-11-10 MED ORDER — SODIUM CHLORIDE 0.9 % IV SOLN: Freq: Once | INTRAVENOUS | Status: AC

## 2020-11-10 MED ORDER — SODIUM CHLORIDE 0.9 % IV SOLN
15.0000 mg/kg | Freq: Once | INTRAVENOUS | Status: AC
  Administered 2020-11-10: 900 mg via INTRAVENOUS
  Filled 2020-11-10: qty 32

## 2020-11-10 MED ORDER — SODIUM CHLORIDE 0.9 % IV SOLN
1200.0000 mg | Freq: Once | INTRAVENOUS | Status: AC
  Administered 2020-11-10: 1200 mg via INTRAVENOUS
  Filled 2020-11-10: qty 20

## 2020-11-10 NOTE — Progress Notes (Signed)
[place] OFFICE PROGRESS NOTE   Diagnosis: Hepatocellular carcinoma  INTERVAL HISTORY:   [name] returns as scheduled.  She is here with her daughter.  She completed another treatment with atezolizumab and bevacizumab on 10/19/2020.  No rash or diarrhea.  She feels well.  She is active getting out of home.  She reports feeling much better since starting treatment for the hepatocellular carcinoma.  She has intermittent nosebleeds relieved with pressure.  Objective:  Vital signs in last 24 hours:  Blood pressure 119/64, pulse 71, temperature 98.7 F (37.1 C), temperature source Oral, resp. rate 18, height 5\' 2"  (1.575 m), weight 131 lb (59.4 kg), SpO2 97 %.     Resp: Lungs clear bilaterally Cardio: Irregular GI: No splenomegaly.  Fullness in the right mid subcostal region without a discrete liver edge, nontender Vascular: No leg edema   Lab Results:  Lab Results  Component Value Date   WBC 9.3 11/10/2020   HGB 11.4 (L) 11/10/2020   HCT 37.5 11/10/2020   MCV 82.2 11/10/2020   PLT 257 11/10/2020   NEUTROABS 4.4 11/10/2020    CMP  Lab Results  Component Value Date   NA 139 10/25/2020   K 4.8 10/25/2020   CL 100 10/25/2020   CO2 23 10/25/2020   GLUCOSE 142 (H) 10/25/2020   BUN 14 10/25/2020   CREATININE 1.02 (H) 10/25/2020   CALCIUM 9.5 10/25/2020   PROT 6.1 10/25/2020   ALBUMIN 3.3 (L) 10/25/2020   AST 259 (HH) 10/25/2020   ALT 123 (H) 10/25/2020   ALKPHOS 269 (H) 10/25/2020   BILITOT 0.6 10/25/2020   GFRNONAA 50 (L) 10/19/2020   GFRAA 54 (L) 02/22/2020    Imaging:  MR LIVER W WO CONTRAST  Result Date: 11/08/2020 CLINICAL DATA:  Hepatocellular carcinoma restaging EXAM: MRI ABDOMEN WITHOUT AND WITH CONTRAST TECHNIQUE: Multiplanar multisequence MR imaging of the abdomen was performed both before and after the administration of intravenous contrast. CONTRAST:  51mL MULTIHANCE GADOBENATE DIMEGLUMINE 529 MG/ML IV SOLN COMPARISON:  07/29/2020  FINDINGS: Lower chest: No acute findings. Hepatobiliary: Innumerable bulky, confluent arterially hyperenhancing masses and lesions throughout the liver with washout and capsular enhancement, substantially increased in size and number compared to prior examination, largest index mass of the posterior right lobe of the liver, hepatic segment VII, measuring 5.0 x 4.7 cm, previously 3.9 x 3.8 cm (series 10, image 23), additional index mass of the tip of the left lobe of the liver, hepatic segment III measuring 3.6 x 2.5 cm, previously 2.4 x 2.0 cm (series 14, image 47). Intrinsically T1 hyperintense biopsy site of the subcapsular peripheral right lobe of the liver (series 9, image 39). No gallstones. No biliary ductal dilatation. Pancreas: No mass, inflammatory changes, or other parenchymal abnormality identified. Spleen:  Within normal limits in size and appearance. Adrenals/Urinary Tract: Unchanged indeterminate nodule of the left adrenal gland measuring 1.5 x 1.1 cm (series 3, image 38). No evidence of hydronephrosis. Stomach/Bowel: Visualized portions within the abdomen are unremarkable. Vascular/Lymphatic: No pathologically enlarged lymph nodes identified. No abdominal aortic aneurysm demonstrated. Aortic atherosclerosis. Other:  None. Musculoskeletal: No suspicious bone lesions identified. IMPRESSION: 1. Innumerable bulky, confluent arterially hyperenhancing masses and lesions with washout and capsular enhancement throughout the liver, substantially increased in size and number compared to prior examination. Findings are consistent with worsened biopsy-proven multifocal hepatocellular carcinoma. 2. Unchanged indeterminate left adrenal nodule measuring 1.5 x 1.1 cm, most likely an incidental benign adenoma. Attention on follow-up. Electronically Signed   By: Cristie Hem  Cherly Beach M.D.   On: 11/08/2020 10:59    Medications: I have reviewed the patient's current medications.   Assessment/Plan: Multiple liver masses CT  abdomen/pelvis 07/11/2018, low-attenuation liver lesions incompletely characterized without contrast Ultrasound abdomen 07/10/2020-innumerable incompletely assessed hypoechoic masses in the liver MRI abdomen 07/29/2020- innumerable hypervascular liver masses, nodular liver contour, 1.5 cm left adrenal mass Biopsy liver lesion 08/08/2020-hepatocellular carcinoma, moderately differentiated Cycle 1 atezolizumab/bevacizumab 08/18/2020 Cycle 2 atezolizumab/bevacizumab 09/07/2020 Cycle 3 atezolizumab/bevacizumab 09/28/2020 Cycle 4 atezolizumab/bevacizumab 10/19/2020 MRI liver 11/07/2020-numerous hyperenhancing liver masses-stable to mildly improved compared to the MRI 07/29/2020 Cycle 5 atezolizumab/bevacizumab 11/10/2020 Coronary artery disease Atrial fibrillation-maintained on anticoagulation History of basal cell and squamous cell skin cancers Diabetes Anorexia/weight loss Abdominal pain Urinary tract infection 08/30/2020   Disposition: [name] appears stable.  She has an improved performance status since beginning treatment for the hepatocellular carcinoma.  She has tolerated the treatment well.  I discussed the MRI findings and reviewed the images with [name] and her daughter.  The MRI was initially read as revealing disease progression.  I reviewed the images with a radiologist.  He indicates the findings are likely due to differences in contrast bolus timing.  The lesions appear stable or slightly improved upon further review.  She will complete another treatment with atezolizumab and bevacizumab today.  [name] will return for an office visit and treatment in 3 weeks.  We will plan for another MRI of the liver after 3-4 more cycles of systemic therapy.  [name], MD  11/10/2020  9:40 AM

## 2020-11-10 NOTE — Progress Notes (Signed)
Patient presents for treatment. RN assessment completed along with the following:  Labs/vitals reviewed - Yes, and MD aware of AST/ALT. Values decreased since last visit.   Weight within 10% of previous measurement - Yes Oncology Treatment Attestation completed for current therapy- Yes, on date 08/12/20 Informed consent completed and reflects current therapy/intent - Yes, on date 08/18/20             Provider progress note reviewed - Note not completed for today. Treatment/Antibody/Supportive plan reviewed - Yes, and there are no adjustments needed for today's treatment. S&H and other orders reviewed - Yes, and there are no additional orders identified. Previous treatment date reviewed - Yes, and the appropriate amount of time has elapsed between treatments. Clinic Hand Off Received from - Lenox Ponds LPN - via note.  Patient to proceed with treatment.

## 2020-11-10 NOTE — Patient Instructions (Addendum)
Stacy [name]  Discharge Instructions: Thank you for choosing [facility] to provide your oncology and hematology care.   If you have a lab appointment with the [facility], please go directly to the [facility] and check in at the registration area.   Wear comfortable clothing and clothing appropriate for easy access to any Portacath or PICC line.   We strive to give you quality time with your provider. You may need to reschedule your appointment if you arrive late (15 or more minutes).  Arriving late affects you and other patients whose appointments are after yours.  Also, if you miss three or more appointments without notifying the office, you may be dismissed from the clinic at the provider's discretion.      For prescription refill requests, have your pharmacy contact our office and allow 72 hours for refills to be completed.    Today you received the following chemotherapy and/or immunotherapy agents: Atezolizumab and Bevacizumab   Atezolizumab injection What is this medication?   ATEZOLIZUMAB (a te zoe LIZ ue mab) is a monoclonal antibody. It is used to treat bladder cancer (urothelial cancer), liver cancer, lung cancer, and melanoma. This medicine may be used for other purposes; ask your health care provider or pharmacist if you have questions. COMMON BRAND NAME(S): Tecentriq What should I tell my care team before I take this medication? They need to know if you have any of these conditions: autoimmune diseases like Crohn's disease, ulcerative colitis, or lupus have had or planning to have an allogeneic stem cell transplant (uses someone else's stem cells) history of organ transplant history of radiation to the chest nervous system problems like myasthenia gravis or Guillain-Barre syndrome an unusual or allergic reaction to atezolizumab, other medicines, foods, dyes, or preservatives pregnant or trying to get pregnant breast-feeding How  should I use this medication? This medicine is for infusion into a vein. It is given by a health care professional in a hospital or clinic setting. A special MedGuide will be given to you before each treatment. Be sure to read this information carefully each time. Talk to your pediatrician regarding the use of this medicine in children. Special care may be needed. Overdosage: If you think you have taken too much of this medicine contact a poison control center or emergency room at once. NOTE: This medicine is only for you. Do not share this medicine with others. What if I miss a dose? It is important not to miss your dose. Call your doctor or health care professional if you are unable to keep an appointment. What may interact with this medication? Interactions have not been studied. This list may not describe all possible interactions. Give your health care provider a list of all the medicines, herbs, non-prescription drugs, or dietary supplements you use. Also tell them if you smoke, drink alcohol, or use illegal drugs. Some items may interact with your medicine. What should I watch for while using this medication? Your condition will be monitored carefully while you are receiving this medicine. You may need blood work done while you are taking this medicine. Do not become pregnant while taking this medicine or for at least 5 months after stopping it. Women should inform their doctor if they wish to become pregnant or think they might be pregnant. There is a potential for serious side effects to an unborn child. Talk to your health care professional or pharmacist for more information. Do not breast-feed an infant while  taking this medicine or for at least 5 months after the last dose. What side effects may I notice from receiving this medication? Side effects that you should report to your doctor or health care professional as soon as possible: allergic reactions like skin rash, itching or hives,  swelling of the face, lips, or tongue black, tarry stools bloody or watery diarrhea breathing problems changes in vision chest pain or chest tightness chills facial flushing fever headache signs and symptoms of high blood sugar such as dizziness; dry mouth; dry skin; fruity breath; nausea; stomach pain; increased hunger or thirst; increased urination signs and symptoms of liver injury like dark yellow or brown urine; general ill feeling or flu-like symptoms; light-colored stools; loss of appetite; nausea; right upper belly pain; unusually weak or tired; yellowing of the eyes or skin stomach pain trouble passing urine or change in the amount of urine Side effects that usually do not require medical attention (report to your doctor or health care professional if they continue or are bothersome): bone pain cough diarrhea joint pain muscle pain muscle weakness swelling of arms or legs tiredness weight loss This list may not describe all possible side effects. Call your doctor for medical advice about side effects. You may report side effects to FDA at 1-800-FDA-1088. Where should I keep my medication? This drug is given in a hospital or clinic and will not be stored at home. NOTE: This sheet is a summary. It may not cover all possible information. If you have questions about this medicine, talk to your doctor, pharmacist, or health care provider.  2022 Elsevier/Gold Standard (2019-10-28 13:59:34)  Bevacizumab injection What is this medication? BEVACIZUMAB (be va SIZ yoo mab) is a monoclonal antibody. It is used to treat many types of cancer. This medicine may be used for other purposes; ask your health care provider or pharmacist if you have questions. COMMON BRAND NAME(S): Avastin, MVASI, Noah Charon What should I tell my care team before I take this medication? They need to know if you have any of these conditions: diabetes heart disease high blood pressure history of coughing up  blood prior anthracycline chemotherapy (e.g., doxorubicin, daunorubicin, epirubicin) recent or ongoing radiation therapy recent or planning to have surgery stroke an unusual or allergic reaction to bevacizumab, hamster proteins, mouse proteins, other medicines, foods, dyes, or preservatives pregnant or trying to get pregnant breast-feeding How should I use this medication? This medicine is for infusion into a vein. It is given by a health care professional in a hospital or clinic setting. Talk to your pediatrician regarding the use of this medicine in children. Special care may be needed. Overdosage: If you think you have taken too much of this medicine contact a poison control center or emergency room at once. NOTE: This medicine is only for you. Do not share this medicine with others. What if I miss a dose? It is important not to miss your dose. Call your doctor or health care professional if you are unable to keep an appointment. What may interact with this medication? Interactions are not expected. This list may not describe all possible interactions. Give your health care provider a list of all the medicines, herbs, non-prescription drugs, or dietary supplements you use. Also tell them if you smoke, drink alcohol, or use illegal drugs. Some items may interact with your medicine. What should I watch for while using this medication? Your condition will be monitored carefully while you are receiving this medicine. You will need important blood work  and urine testing done while you are taking this medicine. This medicine may increase your risk to bruise or bleed. Call your doctor or health care professional if you notice any unusual bleeding. Before having surgery, talk to your health care provider to make sure it is ok. This drug can increase the risk of poor healing of your surgical site or wound. You will need to stop this drug for 28 days before surgery. After surgery, wait at least 28 days  before restarting this drug. Make sure the surgical site or wound is healed enough before restarting this drug. Talk to your health care provider if questions. Do not become pregnant while taking this medicine or for 6 months after stopping it. Women should inform their doctor if they wish to become pregnant or think they might be pregnant. There is a potential for serious side effects to an unborn child. Talk to your health care professional or pharmacist for more information. Do not breast-feed an infant while taking this medicine and for 6 months after the last dose. This medicine has caused ovarian failure in some women. This medicine may interfere with the ability to have a child. You should talk to your doctor or health care professional if you are concerned about your fertility. What side effects may I notice from receiving this medication? Side effects that you should report to your doctor or health care professional as soon as possible: allergic reactions like skin rash, itching or hives, swelling of the face, lips, or tongue chest pain or chest tightness chills coughing up blood high fever seizures severe constipation signs and symptoms of bleeding such as bloody or black, tarry stools; red or dark-brown urine; spitting up blood or brown material that looks like coffee grounds; red spots on the skin; unusual bruising or bleeding from the eye, gums, or nose signs and symptoms of a blood clot such as breathing problems; chest pain; severe, sudden headache; pain, swelling, warmth in the leg signs and symptoms of a stroke like changes in vision; confusion; trouble speaking or understanding; severe headaches; sudden numbness or weakness of the face, arm or leg; trouble walking; dizziness; loss of balance or coordination stomach pain sweating swelling of legs or ankles vomiting weight gain Side effects that usually do not require medical attention (report to your doctor or health care  professional if they continue or are bothersome): back pain changes in taste decreased appetite dry skin nausea tiredness This list may not describe all possible side effects. Call your doctor for medical advice about side effects. You may report side effects to FDA at 1-800-FDA-1088. Where should I keep my medication? This drug is given in a hospital or clinic and will not be stored at home. NOTE: This sheet is a summary. It may not cover all possible information. If you have questions about this medicine, talk to your doctor, pharmacist, or health care provider.  2022 Elsevier/Gold Standard (2018-11-25 10:50:46)     To help prevent nausea and vomiting after your treatment, we encourage you to take your nausea medication as directed.  BELOW ARE SYMPTOMS THAT SHOULD BE REPORTED IMMEDIATELY: *FEVER GREATER THAN 100.4 F (38 C) OR HIGHER *CHILLS OR SWEATING *NAUSEA AND VOMITING THAT IS NOT CONTROLLED WITH YOUR NAUSEA MEDICATION *UNUSUAL SHORTNESS OF BREATH *UNUSUAL BRUISING OR BLEEDING *URINARY PROBLEMS (pain or burning when urinating, or frequent urination) *BOWEL PROBLEMS (unusual diarrhea, constipation, pain near the anus) TENDERNESS IN MOUTH AND THROAT WITH OR WITHOUT PRESENCE OF ULCERS (sore throat, sores in  mouth, or a toothache) UNUSUAL RASH, SWELLING OR PAIN  UNUSUAL VAGINAL DISCHARGE OR ITCHING   Items with * indicate a potential emergency and should be followed up as soon as possible or go to the Emergency Department if any problems should occur.  Please show the CHEMOTHERAPY ALERT CARD or IMMUNOTHERAPY ALERT CARD at check-in to the Emergency Department and triage nurse.  Should you have questions after your visit or need to cancel or reschedule your appointment, please contact [facility]  Dept: [phone]  and follow the prompts.  Office hours are 8:00 a.m. to 4:30 p.m. Monday - Friday. Please note that voicemails left after 4:00 p.m. may not  be returned until the following business day.  We are closed weekends and major holidays. You have access to a nurse at all times for urgent questions. Please call the main number to the clinic Dept: [phone] and follow the prompts.   For any non-urgent questions, you may also contact your provider using MyChart. We now offer e-Visits for anyone 58 and older to request care online for non-urgent symptoms. For details visit [email or web address].   Also download the MyChart app! Go to the app store, search "MyChart", open the app, select [facility], and log in with your MyChart username and password.  Due to Covid, a mask is required upon entering the hospital/clinic. If you do not have a mask, one will be given to you upon arrival. For doctor visits, patients may have 1 support person aged 89 or older with them. For treatment visits, patients cannot have anyone with them due to current Covid guidelines and our immunocompromised population.

## 2020-11-10 NOTE — Progress Notes (Signed)
Patient seen by Dr. Benay Spice today  Vitals are within treatment parameters.  Labs reviewed by Dr. Benay Spice and are not all within treatment parameters. Ok to treat with AST -163 ALT 97  Per physician team, patient is ready for treatment and there are NO modifications to the treatment plan.

## 2020-11-13 ENCOUNTER — Ambulatory Visit: Payer: Medicare Other | Admitting: Oncology

## 2020-11-13 ENCOUNTER — Ambulatory Visit: Payer: Medicare Other

## 2020-11-13 ENCOUNTER — Other Ambulatory Visit: Payer: Medicare Other

## 2020-11-17 ENCOUNTER — Ambulatory Visit (INDEPENDENT_AMBULATORY_CARE_PROVIDER_SITE_OTHER): Payer: Medicare Other | Admitting: Cardiovascular Disease

## 2020-11-17 ENCOUNTER — Other Ambulatory Visit: Payer: Self-pay

## 2020-11-17 ENCOUNTER — Encounter: Payer: Self-pay | Admitting: Cardiovascular Disease

## 2020-11-17 VITALS — BP 116/66 | HR 67 | Ht 62.0 in | Wt 128.6 lb

## 2020-11-17 DIAGNOSIS — I639 Cerebral infarction, unspecified: Secondary | ICD-10-CM

## 2020-11-17 DIAGNOSIS — I10 Essential (primary) hypertension: Secondary | ICD-10-CM | POA: Diagnosis not present

## 2020-11-17 DIAGNOSIS — I2510 Atherosclerotic heart disease of native coronary artery without angina pectoris: Secondary | ICD-10-CM | POA: Diagnosis not present

## 2020-11-17 DIAGNOSIS — I480 Paroxysmal atrial fibrillation: Secondary | ICD-10-CM

## 2020-11-17 MED ORDER — APIXABAN 2.5 MG PO TABS: 2.5000 mg | ORAL_TABLET | Freq: Two times a day (BID) | ORAL | 11 refills | Status: DC

## 2020-11-17 NOTE — Progress Notes (Signed)
Cardiology Office Note:    Date:  11/17/2020   ID:  Stacy [name], DOB Feb 28, 1926, MRN [id number]  PCP:  Lillard Anes, MD   [facility] Providers Cardiologist:  Sherren Mocha, MD Electrophysiologist:  Virl Axe, MD     Referring MD: Lillard Anes,*   Chief Complaint  Patient presents with   Coronary Artery Disease    History of Present Illness:    Stacy [name] is a 87 y.o. female with a hx of coronary artery disease and stroke, presenting for follow-up evaluation. The patient initially presented in 1997 with an anterior wall MI treated with balloon angioplasty of the LAD.  She later underwent stenting of the RCA in 2000.  Her most recent heart catheterization in 2008 demonstrated patency of her angioplasty and stent sites in the LAD and RCA, respectively.  The patient had a stroke in 2018.  A loop recorder demonstrated paroxysmal atrial fibrillation from which the patient has not had any associated symptoms.  When I saw the patient last in June 2022 she was in atrial fibrillation.  She was started on metoprolol succinate and went for follow-up in the atrial fibrillation clinic but was back in sinus rhythm at that time.  She has not had any cardiac-related symptoms since then.  The patient is here with her daughter today.  She has been diagnosed with hepatocellular carcinoma and is undergoing immunotherapy treatment under the care of Dr. Benay Spice.  Her daughter states that she is really been feeling better of late and has noticed some improvement since she has began immunotherapy.  The patient has had some decline in her memory.  She denies chest pain, chest pressure, or shortness of breath.  She has had no recent heart palpitations.  Past Medical History:  Diagnosis Date   Anxiety    Hyperlipidemia     Past Surgical History:  Procedure Laterality Date   3 epidural steroid injections     APPENDECTOMY     Basal Cell Cancer Removal     forehead   CATARACT  EXTRACTION     CATARACT EXTRACTION  12/29/2003   CORONARY ANGIOPLASTY  03-15-1995   CORONARY ANGIOPLASTY WITH STENT PLACEMENT  05-19-1998   CORONARY STENT PLACEMENT  2000   DILATION AND CURETTAGE OF UTERUS     ENDARTERECTOMY Right 04/13/2015   Procedure: ENDARTERECTOMY CAROTID;  Surgeon: Serafina Mitchell, MD;  Location: [place];  Service: Vascular;  Laterality: Right;   ENDOMETRIAL BIOPSY     EYE SURGERY     heart catherization     HEMORRHOID SURGERY     INCISIONAL HERNIA REPAIR     INGUINAL HERNIA REPAIR     LOOP RECORDER INSERTION N/A 06/24/2016   Procedure: Loop Recorder Insertion;  Surgeon: Deboraha Sprang, MD;  Location: [place] CV [facility];  Service: Cardiovascular;  Laterality: N/A;   PATCH ANGIOPLASTY Right 04/13/2015   Procedure: PATCH ANGIOPLASTY;  Surgeon: Serafina Mitchell, MD;  Location: [facility] OR;  Service: Vascular;  Laterality: Right;   PTCA     SQUAMOUS CELL CARCINOMA EXCISION     left lower leg   TEE WITHOUT CARDIOVERSION N/A 06/24/2016   Procedure: TRANSESOPHAGEAL ECHOCARDIOGRAM (TEE);  Surgeon: Dorothy Spark, MD;  Location: [facility] ENDOSCOPY;  Service: Cardiovascular;  Laterality: N/A;   TONSILLECTOMY      Current Medications: Current Meds  Medication Sig   ACCU-CHEK SMARTVIEW test strip 1 each by Other route as needed for other (blood sugar test strips).  apixaban (ELIQUIS) 2.5 MG TABS tablet Take 1 tablet (2.5 mg total) by mouth 2 (two) times daily.   cetirizine (ZYRTEC) 10 MG tablet TAKE 1 TABLET BY MOUTH EVERY DAY   diphenhydrAMINE (BENADRYL) 25 MG tablet Take 25 mg by mouth every 6 (six) hours as needed.   Famotidine-Ca Carb-Mag Hydrox (PEPCID COMPLETE PO) Take by mouth.   linaclotide (LINZESS) 72 MCG capsule Take 72 mcg by mouth daily. As needed   metFORMIN (GLUCOPHAGE) 500 MG tablet Take 500 mg by mouth daily with breakfast. Taking BID   metoprolol succinate (TOPROL XL) 25 MG 24 hr tablet Take 1 tablet (25 mg total) by mouth in the morning and at bedtime.    mometasone (ELOCON) 0.1 % cream mometasone 0.1 % topical cream  APPLY TO AFFECTED AREA EVERY DAY AS NEEDED ITCHING   NEOMYCIN-POLYMYXIN-HYDROCORTISONE (CORTISPORIN) 1 % SOLN otic solution Place 2 drops into both ears as directed.   nitroGLYCERIN (NITROSTAT) 0.4 MG SL tablet Place 1 tablet (0.4 mg total) under the tongue every 5 (five) minutes as needed for chest pain.   ondansetron (ZOFRAN ODT) 4 MG disintegrating tablet Take 1 tablet (4 mg total) by mouth every 8 (eight) hours as needed for nausea or vomiting.   rosuvastatin (CRESTOR) 10 MG tablet TAKE 1 TABLET BY MOUTH EVERY DAY   sertraline (ZOLOFT) 50 MG tablet TAKE 1 TABLET BY MOUTH EVERY DAY   traMADol (ULTRAM) 50 MG tablet Take 0.5-1 tablets (25-50 mg total) by mouth every 12 (twelve) hours as needed.   [DISCONTINUED] apixaban (ELIQUIS) 5 MG TABS tablet Take 1 tablet (5 mg total) by mouth 2 (two) times daily.     Allergies:   Iodinated diagnostic agents, Iohexol, Sulfonamide derivatives, Latex, Tranilast, Metformin hcl, Other, Tape, Wound dressing adhesive, and Sulfa antibiotics   Social History   Socioeconomic History   Marital status: Widowed    Spouse name: Not on file   Number of children: 1   Years of education: Not on file   Highest education level: Not on file  Occupational History   Occupation: Retired    Fish farm manager: RETIRED    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  Tobacco Use   Smoking status: Former    Packs/day: 1.00    Years: 54.00    Pack years: 54.00    Types: Cigarettes    Quit date: 10/05/1998    Years since quitting: 22.1   Smokeless tobacco: Never  Vaping Use   Vaping Use: Never used  Substance and Sexual Activity   Alcohol use: Not Currently    Alcohol/week: 0.0 standard drinks   Drug use: Never   Sexual activity: Not Currently  Other Topics Concern   Not on file  Social History Narrative   Not on file   Social Determinants of Health   Financial Resource Strain: Not on file  Food Insecurity: Not on file   Transportation Needs: Not on file  Physical Activity: Not on file  Stress: Not on file  Social Connections: Not on file     Family History: The patient's family history includes Cancer in her father; Diabetes in her father; Hypertension in her paternal uncle; Parkinsonism in her mother; Prostate cancer in her father; Stroke in her paternal uncle. There is no history of Heart attack.  ROS:   Please see the history of present illness.    Positive for memory loss, fatigue, epistaxis.  All other systems reviewed and are negative.  EKGs/Labs/Other Studies Reviewed:    The following studies were reviewed today:  2D echocardiogram 08/16/2020: 1. All apical segments and apex are akinetic consistent with prior LAD  infarction. The apex appears aneursymal. No definitive LV thrombus but  would consider limited contrast study if there is clinical concern for  this. EF is mildly reduced, 40-45%, and  WMA are unchanged from 2018 study. Left ventricular ejection fraction, by  estimation, is 40 to 45%. The left ventricle has mildly decreased  function. The left ventricle demonstrates regional wall motion  abnormalities (see scoring diagram/findings for  description). There is mild concentric left ventricular hypertrophy. Left  ventricular diastolic function could not be evaluated.   2. Right ventricular systolic function is normal. The right ventricular  size is normal. There is normal pulmonary artery systolic pressure.   3. The mitral valve is grossly normal. Mild mitral valve regurgitation.  No evidence of mitral stenosis.   4. The aortic valve is tricuspid. There is mild calcification of the  aortic valve. Aortic valve regurgitation is not visualized. Mild aortic  valve sclerosis is present, with no evidence of aortic valve stenosis.   5. The inferior vena cava is normal in size with greater than 50%  respiratory variability, suggesting right atrial pressure of 3 mmHg.   Comparison(s): No  significant change from prior study.   EKG:  EKG is not ordered today.    Recent Labs: 10/27/2020: TSH 4.210 11/10/2020: ALT 97; BUN 15; Creatinine 0.92; Hemoglobin 11.4; Platelet Count 257; Potassium 4.4; Sodium 140  Recent Lipid Panel    Component Value Date/Time   CHOL 125 10/27/2020 1130   TRIG 134 10/27/2020 1130   HDL 35 (L) 10/27/2020 1130   CHOLHDL 3.6 10/27/2020 1130   CHOLHDL 2.4 06/23/2016 0441   VLDL 23 06/23/2016 0441   LDLCALC 66 10/27/2020 1130     Risk Assessment/Calculations:    CHA2DS2-VASc Score = 7   This indicates a 11.2% annual risk of stroke. The patient's score is based upon: CHF History: 0 HTN History: 1 Diabetes History: 0 Stroke History: 2 Vascular Disease History: 1 Age Score: 2 Gender Score: 1          Physical Exam:    VS:  BP 116/66   Pulse 67   Ht 5\' 2"  (1.575 m)   Wt 128 lb 9.6 oz (58.3 kg)   SpO2 96%   BMI 23.52 kg/m     Wt Readings from Last 3 Encounters:  11/17/20 128 lb 9.6 oz (58.3 kg)  11/10/20 131 lb (59.4 kg)  10/27/20 133 lb (60.3 kg)     GEN:  Well nourished, well developed pleasant elderly woman in no acute distress HEENT: Normal NECK: No JVD; No carotid bruits LYMPHATICS: No lymphadenopathy CARDIAC: RRR, no murmurs, rubs, gallops RESPIRATORY:  Clear to auscultation without rales, wheezing or rhonchi  ABDOMEN: Soft, non-tender, non-distended MUSCULOSKELETAL:  No edema; No deformity  SKIN: Warm and dry NEUROLOGIC:  Alert and oriented x 3 PSYCHIATRIC:  Normal affect   ASSESSMENT:    1. Paroxysmal atrial fibrillation (HCC)   2. Coronary artery disease involving native coronary artery of native heart without angina pectoris   3. Essential hypertension    PLAN:    In order of problems listed above:  The patient appears to be asymptomatic at this point.  I think she is in sinus rhythm today.  She is tolerating her current therapies.  She has had some nosebleeds.  Since she has lost some weight.  She is now  less than 60 kg and she qualifies for reduced  dosing of apixaban at 2.5 mg twice daily.  This dosage changes made today.  Otherwise no changes in her medical program are made. Stable without symptoms of angina.  Continue current therapy with a beta-blocker.  No antiplatelet therapy because of the use of apixaban.  Continue low-dose rosuvastatin. Blood pressure is well controlled.        Medication Adjustments/Labs and Tests Ordered: Current medicines are reviewed at length with the patient today.  Concerns regarding medicines are outlined above.  No orders of the defined types were placed in this encounter.  Meds ordered this encounter  Medications   apixaban (ELIQUIS) 2.5 MG TABS tablet    Sig: Take 1 tablet (2.5 mg total) by mouth 2 (two) times daily.    Dispense:  60 tablet    Refill:  11    Dose change    Patient Instructions  Medication Instructions:  1) DECREASE Eliquis to 2.5mg  twice daily  *If you need a refill on your cardiac medications before your next appointment, please call your pharmacy*   Lab Work: None If you have labs (blood work) drawn today and your tests are completely normal, you will receive your results only by: [place] (if you have MyChart) OR A paper copy in the mail If you have any lab test that is abnormal or we need to change your treatment, we will call you to review the results.   Testing/Procedures: None   Follow-Up: At [facility], you and your health needs are our priority.  As part of our continuing mission to provide you with exceptional heart care, we have created designated Provider Care Teams.  These Care Teams include your primary Cardiologist (physician) and Advanced Practice Providers (APPs -  Physician Assistants and Nurse Practitioners) who all work together to provide you with the care you need, when you need it.  We recommend signing up for the patient portal called "MyChart".  Sign up information is provided on this  After Visit Summary.  MyChart is used to connect with patients for Virtual Visits (Telemedicine).  Patients are able to view lab/test results, encounter notes, upcoming appointments, etc.  Non-urgent messages can be sent to your provider as well.   To learn more about what you can do with MyChart, go to [email or web address].    Your next appointment:   6 month(s)  The format for your next appointment:   In Person  Provider:   You may see Sherren Mocha, MD or one of the following Advanced Practice Providers on your designated Care Team:   Richardson Dopp, PA-C Robbie Lis, [place]   Other Instructions     Signed, Sherren Mocha, MD  11/17/2020 2:58 PM    [place]

## 2020-11-17 NOTE — Patient Instructions (Signed)
Medication Instructions:  1) DECREASE Eliquis to 2.5mg  twice daily  *If you need a refill on your cardiac medications before your next appointment, please call your pharmacy*   Lab Work: None If you have labs (blood work) drawn today and your tests are completely normal, you will receive your results only by: [place] (if you have MyChart) OR A paper copy in the mail If you have any lab test that is abnormal or we need to change your treatment, we will call you to review the results.   Testing/Procedures: None   Follow-Up: At [facility], you and your health needs are our priority.  As part of our continuing mission to provide you with exceptional heart care, we have created designated Provider Care Teams.  These Care Teams include your primary Cardiologist (physician) and Advanced Practice Providers (APPs -  Physician Assistants and Nurse Practitioners) who all work together to provide you with the care you need, when you need it.  We recommend signing up for the patient portal called "MyChart".  Sign up information is provided on this After Visit Summary.  MyChart is used to connect with patients for Virtual Visits (Telemedicine).  Patients are able to view lab/test results, encounter notes, upcoming appointments, etc.  Non-urgent messages can be sent to your provider as well.   To learn more about what you can do with MyChart, go to [email or web address].    Your next appointment:   6 month(s)  The format for your next appointment:   In Person  Provider:   You may see Sherren Mocha, MD or one of the following Advanced Practice Providers on your designated Care Team:   Richardson Dopp, PA-C Robbie Lis, [place]   Other Instructions

## 2020-12-03 ENCOUNTER — Other Ambulatory Visit: Payer: Self-pay | Admitting: Oncology

## 2020-12-04 ENCOUNTER — Inpatient Hospital Stay: Payer: Medicare Other | Attending: Nurse Practitioner

## 2020-12-04 ENCOUNTER — Inpatient Hospital Stay: Payer: Medicare Other

## 2020-12-04 ENCOUNTER — Inpatient Hospital Stay (HOSPITAL_BASED_OUTPATIENT_CLINIC_OR_DEPARTMENT_OTHER): Payer: Medicare Other | Admitting: Oncology

## 2020-12-04 ENCOUNTER — Other Ambulatory Visit: Payer: Self-pay

## 2020-12-04 ENCOUNTER — Telehealth: Payer: Self-pay

## 2020-12-04 ENCOUNTER — Encounter: Payer: Self-pay | Admitting: *Deleted

## 2020-12-04 VITALS — BP 116/73 | HR 76 | Temp 98.7°F | Resp 18 | Ht 62.0 in | Wt 126.0 lb

## 2020-12-04 DIAGNOSIS — R109 Unspecified abdominal pain: Secondary | ICD-10-CM | POA: Insufficient documentation

## 2020-12-04 DIAGNOSIS — R634 Abnormal weight loss: Secondary | ICD-10-CM | POA: Diagnosis not present

## 2020-12-04 DIAGNOSIS — I4891 Unspecified atrial fibrillation: Secondary | ICD-10-CM | POA: Diagnosis not present

## 2020-12-04 DIAGNOSIS — E119 Type 2 diabetes mellitus without complications: Secondary | ICD-10-CM | POA: Insufficient documentation

## 2020-12-04 DIAGNOSIS — C221 Intrahepatic bile duct carcinoma: Secondary | ICD-10-CM | POA: Insufficient documentation

## 2020-12-04 DIAGNOSIS — C220 Liver cell carcinoma: Secondary | ICD-10-CM

## 2020-12-04 DIAGNOSIS — Z79899 Other long term (current) drug therapy: Secondary | ICD-10-CM | POA: Insufficient documentation

## 2020-12-04 DIAGNOSIS — Z23 Encounter for immunization: Secondary | ICD-10-CM | POA: Insufficient documentation

## 2020-12-04 DIAGNOSIS — Z87442 Personal history of urinary calculi: Secondary | ICD-10-CM | POA: Diagnosis not present

## 2020-12-04 DIAGNOSIS — Z85828 Personal history of other malignant neoplasm of skin: Secondary | ICD-10-CM | POA: Insufficient documentation

## 2020-12-04 DIAGNOSIS — I2510 Atherosclerotic heart disease of native coronary artery without angina pectoris: Secondary | ICD-10-CM | POA: Insufficient documentation

## 2020-12-04 DIAGNOSIS — Z5112 Encounter for antineoplastic immunotherapy: Secondary | ICD-10-CM | POA: Insufficient documentation

## 2020-12-04 LAB — CMP (CANCER CENTER ONLY)
ALT: 134 U/L — ABNORMAL HIGH (ref 0–44)
AST: 200 U/L (ref 15–41)
Albumin: 3.2 g/dL — ABNORMAL LOW (ref 3.5–5.0)
Alkaline Phosphatase: 181 U/L — ABNORMAL HIGH (ref 38–126)
Anion gap: 9 (ref 5–15)
BUN: 14 mg/dL (ref 8–23)
CO2: 25 mmol/L (ref 22–32)
Calcium: 9.1 mg/dL (ref 8.9–10.3)
Chloride: 100 mmol/L (ref 98–111)
Creatinine: 1.18 mg/dL — ABNORMAL HIGH (ref 0.44–1.00)
GFR, Estimated: 43 mL/min — ABNORMAL LOW (ref >60)
Glucose, Bld: 144 mg/dL — ABNORMAL HIGH (ref 70–99)
Potassium: 4.4 mmol/L (ref 3.5–5.1)
Sodium: 134 mmol/L — ABNORMAL LOW (ref 135–145)
Total Bilirubin: 0.7 mg/dL (ref 0.3–1.2)
Total Protein: 6.5 g/dL (ref 6.5–8.1)

## 2020-12-04 LAB — CBC WITH DIFFERENTIAL (CANCER CENTER ONLY)
Abs Immature Granulocytes: 0.02 10*3/uL (ref 0.00–0.07)
Basophils Absolute: 0.1 10*3/uL (ref 0.0–0.1)
Basophils Relative: 1 %
Eosinophils Absolute: 0.3 10*3/uL (ref 0.0–0.5)
Eosinophils Relative: 3 %
HCT: 37.3 % (ref 36.0–46.0)
Hemoglobin: 11.3 g/dL — ABNORMAL LOW (ref 12.0–15.0)
Immature Granulocytes: 0 %
Lymphocytes Relative: 27 %
Lymphs Abs: 2.8 10*3/uL (ref 0.7–4.0)
MCH: 24.9 pg — ABNORMAL LOW (ref 26.0–34.0)
MCHC: 30.3 g/dL (ref 30.0–36.0)
MCV: 82.2 fL (ref 80.0–100.0)
Monocytes Absolute: 1.1 10*3/uL — ABNORMAL HIGH (ref 0.1–1.0)
Monocytes Relative: 11 %
Neutro Abs: 6 10*3/uL (ref 1.7–7.7)
Neutrophils Relative %: 58 %
Platelet Count: 311 10*3/uL (ref 150–400)
RBC: 4.54 MIL/uL (ref 3.87–5.11)
RDW: 17.3 % — ABNORMAL HIGH (ref 11.5–15.5)
WBC Count: 10.4 10*3/uL (ref 4.0–10.5)
nRBC: 0 % (ref 0.0–0.2)

## 2020-12-04 LAB — TOTAL PROTEIN, URINE DIPSTICK: Protein, ur: 30 mg/dL — AB

## 2020-12-04 MED ORDER — INFLUENZA VAC A&B SA ADJ QUAD 0.5 ML IM PRSY
0.5000 mL | PREFILLED_SYRINGE | Freq: Once | INTRAMUSCULAR | Status: AC
  Administered 2020-12-04: 0.5 mL via INTRAMUSCULAR
  Filled 2020-12-04: qty 0.5

## 2020-12-04 MED ORDER — SODIUM CHLORIDE 0.9 % IV SOLN
15.0000 mg/kg | Freq: Once | INTRAVENOUS | Status: AC
  Administered 2020-12-04: 900 mg via INTRAVENOUS
  Filled 2020-12-04: qty 32

## 2020-12-04 MED ORDER — SODIUM CHLORIDE 0.9 % IV SOLN
1200.0000 mg | Freq: Once | INTRAVENOUS | Status: AC
  Administered 2020-12-04: 1200 mg via INTRAVENOUS
  Filled 2020-12-04: qty 20

## 2020-12-04 MED ORDER — SODIUM CHLORIDE 0.9 % IV SOLN: Freq: Once | INTRAVENOUS | Status: AC

## 2020-12-04 NOTE — Patient Instructions (Signed)
[facility]  Discharge Instructions: Thank you for choosing [facility] to provide your oncology and hematology care.   If you have a lab appointment with the [facility], please go directly to the [facility] and check in at the registration area.   Wear comfortable clothing and clothing appropriate for easy access to any Portacath or PICC line.   We strive to give you quality time with your provider. You may need to reschedule your appointment if you arrive late (15 or more minutes).  Arriving late affects you and other patients whose appointments are after yours.  Also, if you miss three or more appointments without notifying the office, you may be dismissed from the clinic at the provider's discretion.      For prescription refill requests, have your pharmacy contact our office and allow 72 hours for refills to be completed.    Today you received the following chemotherapy and/or immunotherapy agents TECENTRIQ and MVAI      To help prevent nausea and vomiting after your treatment, we encourage you to take your nausea medication as directed.  BELOW ARE SYMPTOMS THAT SHOULD BE REPORTED IMMEDIATELY: *FEVER GREATER THAN 100.4 F (38 C) OR HIGHER *CHILLS OR SWEATING *NAUSEA AND VOMITING THAT IS NOT CONTROLLED WITH YOUR NAUSEA MEDICATION *UNUSUAL SHORTNESS OF BREATH *UNUSUAL BRUISING OR BLEEDING *URINARY PROBLEMS (pain or burning when urinating, or frequent urination) *BOWEL PROBLEMS (unusual diarrhea, constipation, pain near the anus) TENDERNESS IN MOUTH AND THROAT WITH OR WITHOUT PRESENCE OF ULCERS (sore throat, sores in mouth, or a toothache) UNUSUAL RASH, SWELLING OR PAIN  UNUSUAL VAGINAL DISCHARGE OR ITCHING   Items with * indicate a potential emergency and should be followed up as soon as possible or go to the Emergency Department if any problems should occur.  Please show the CHEMOTHERAPY ALERT CARD or IMMUNOTHERAPY ALERT CARD at  check-in to the Emergency Department and triage nurse.  Should you have questions after your visit or need to cancel or reschedule your appointment, please contact [facility]  Dept: [phone]  and follow the prompts.  Office hours are 8:00 a.m. to 4:30 p.m. Monday - Friday. Please note that voicemails left after 4:00 p.m. may not be returned until the following business day.  We are closed weekends and major holidays. You have access to a nurse at all times for urgent questions. Please call the main number to the clinic Dept: [phone] and follow the prompts.   For any non-urgent questions, you may also contact your provider using MyChart. We now offer e-Visits for anyone 56 and older to request care online for non-urgent symptoms. For details visit [email or web address].   Also download the MyChart app! Go to the app store, search "MyChart", open the app, select [facility], and log in with your MyChart username and password.  Due to Covid, a mask is required upon entering the hospital/clinic. If you do not have a mask, one will be given to you upon arrival. For doctor visits, patients may have 1 support person aged 24 or older with them. For treatment visits, patients cannot have anyone with them due to current Covid guidelines and our immunocompromised population.    Atezolizumab injection What is this medication? ATEZOLIZUMAB (a te zoe LIZ ue mab) is a monoclonal antibody. It is used to treat bladder cancer (urothelial cancer), liver cancer, lung cancer, and melanoma. This medicine may be used for other purposes; ask your health care provider or  pharmacist if you have questions. COMMON BRAND NAME(S): Tecentriq What should I tell my care team before I take this medication? They need to know if you have any of these conditions: autoimmune diseases like Crohn's disease, ulcerative colitis, or lupus have had or planning to have an allogeneic stem cell  transplant (uses someone else's stem cells) history of organ transplant history of radiation to the chest nervous system problems like myasthenia gravis or Guillain-Barre syndrome an unusual or allergic reaction to atezolizumab, other medicines, foods, dyes, or preservatives pregnant or trying to get pregnant breast-feeding How should I use this medication? This medicine is for infusion into a vein. It is given by a health care professional in a hospital or clinic setting. A special MedGuide will be given to you before each treatment. Be sure to read this information carefully each time. Talk to your pediatrician regarding the use of this medicine in children. Special care may be needed. Overdosage: If you think you have taken too much of this medicine contact a poison control center or emergency room at once. NOTE: This medicine is only for you. Do not share this medicine with others. What if I miss a dose? It is important not to miss your dose. Call your doctor or health care professional if you are unable to keep an appointment. What may interact with this medication? Interactions have not been studied. This list may not describe all possible interactions. Give your health care provider a list of all the medicines, herbs, non-prescription drugs, or dietary supplements you use. Also tell them if you smoke, drink alcohol, or use illegal drugs. Some items may interact with your medicine. What should I watch for while using this medication? Your condition will be monitored carefully while you are receiving this medicine. You may need blood work done while you are taking this medicine. Do not become pregnant while taking this medicine or for at least 5 months after stopping it. Women should inform their doctor if they wish to become pregnant or think they might be pregnant. There is a potential for serious side effects to an unborn child. Talk to your health care professional or pharmacist for  more information. Do not breast-feed an infant while taking this medicine or for at least 5 months after the last dose. What side effects may I notice from receiving this medication? Side effects that you should report to your doctor or health care professional as soon as possible: allergic reactions like skin rash, itching or hives, swelling of the face, lips, or tongue black, tarry stools bloody or watery diarrhea breathing problems changes in vision chest pain or chest tightness chills facial flushing fever headache signs and symptoms of high blood sugar such as dizziness; dry mouth; dry skin; fruity breath; nausea; stomach pain; increased hunger or thirst; increased urination signs and symptoms of liver injury like dark yellow or brown urine; general ill feeling or flu-like symptoms; light-colored stools; loss of appetite; nausea; right upper belly pain; unusually weak or tired; yellowing of the eyes or skin stomach pain trouble passing urine or change in the amount of urine Side effects that usually do not require medical attention (report to your doctor or health care professional if they continue or are bothersome): bone pain cough diarrhea joint pain muscle pain muscle weakness swelling of arms or legs tiredness weight loss This list may not describe all possible side effects. Call your doctor for medical advice about side effects. You may report side effects to FDA at  1-800-FDA-1088. Where should I keep my medication? This drug is given in a hospital or clinic and will not be stored at home. NOTE: This sheet is a summary. It may not cover all possible information. If you have questions about this medicine, talk to your doctor, pharmacist, or health care provider.  2022 Elsevier/Gold Standard (2019-10-28 13:59:34)  Bevacizumab injection What is this medication? BEVACIZUMAB (be va SIZ yoo mab) is a monoclonal antibody. It is used to treat many types of cancer. This  medicine may be used for other purposes; ask your health care provider or pharmacist if you have questions. COMMON BRAND NAME(S): Avastin, MVASI, Noah Charon What should I tell my care team before I take this medication? They need to know if you have any of these conditions: diabetes heart disease high blood pressure history of coughing up blood prior anthracycline chemotherapy (e.g., doxorubicin, daunorubicin, epirubicin) recent or ongoing radiation therapy recent or planning to have surgery stroke an unusual or allergic reaction to bevacizumab, hamster proteins, mouse proteins, other medicines, foods, dyes, or preservatives pregnant or trying to get pregnant breast-feeding How should I use this medication? This medicine is for infusion into a vein. It is given by a health care professional in a hospital or clinic setting. Talk to your pediatrician regarding the use of this medicine in children. Special care may be needed. Overdosage: If you think you have taken too much of this medicine contact a poison control center or emergency room at once. NOTE: This medicine is only for you. Do not share this medicine with others. What if I miss a dose? It is important not to miss your dose. Call your doctor or health care professional if you are unable to keep an appointment. What may interact with this medication? Interactions are not expected. This list may not describe all possible interactions. Give your health care provider a list of all the medicines, herbs, non-prescription drugs, or dietary supplements you use. Also tell them if you smoke, drink alcohol, or use illegal drugs. Some items may interact with your medicine. What should I watch for while using this medication? Your condition will be monitored carefully while you are receiving this medicine. You will need important blood work and urine testing done while you are taking this medicine. This medicine may increase your risk to bruise or  bleed. Call your doctor or health care professional if you notice any unusual bleeding. Before having surgery, talk to your health care provider to make sure it is ok. This drug can increase the risk of poor healing of your surgical site or wound. You will need to stop this drug for 28 days before surgery. After surgery, wait at least 28 days before restarting this drug. Make sure the surgical site or wound is healed enough before restarting this drug. Talk to your health care provider if questions. Do not become pregnant while taking this medicine or for 6 months after stopping it. Women should inform their doctor if they wish to become pregnant or think they might be pregnant. There is a potential for serious side effects to an unborn child. Talk to your health care professional or pharmacist for more information. Do not breast-feed an infant while taking this medicine and for 6 months after the last dose. This medicine has caused ovarian failure in some women. This medicine may interfere with the ability to have a child. You should talk to your doctor or health care professional if you are concerned about your fertility. What side  effects may I notice from receiving this medication? Side effects that you should report to your doctor or health care professional as soon as possible: allergic reactions like skin rash, itching or hives, swelling of the face, lips, or tongue chest pain or chest tightness chills coughing up blood high fever seizures severe constipation signs and symptoms of bleeding such as bloody or black, tarry stools; red or dark-brown urine; spitting up blood or brown material that looks like coffee grounds; red spots on the skin; unusual bruising or bleeding from the eye, gums, or nose signs and symptoms of a blood clot such as breathing problems; chest pain; severe, sudden headache; pain, swelling, warmth in the leg signs and symptoms of a stroke like changes in vision; confusion;  trouble speaking or understanding; severe headaches; sudden numbness or weakness of the face, arm or leg; trouble walking; dizziness; loss of balance or coordination stomach pain sweating swelling of legs or ankles vomiting weight gain Side effects that usually do not require medical attention (report to your doctor or health care professional if they continue or are bothersome): back pain changes in taste decreased appetite dry skin nausea tiredness This list may not describe all possible side effects. Call your doctor for medical advice about side effects. You may report side effects to FDA at 1-800-FDA-1088. Where should I keep my medication? This drug is given in a hospital or clinic and will not be stored at home. NOTE: This sheet is a summary. It may not cover all possible information. If you have questions about this medicine, talk to your doctor, pharmacist, or health care provider.  2022 Elsevier/Gold Standard (2018-11-25 10:50:46)

## 2020-12-04 NOTE — Progress Notes (Signed)
CRITICAL VALUE STICKER  CRITICAL VALUE: AST 200   RECEIVER (on-site recipient of call): Syriah Delisi,RN   DATE & TIME NOTIFIED: 12/04/20 @ 1127  MESSENGER (representative from lab):Otila Kluver  MD NOTIFIED: Dr. Benay Spice  TIME OF NOTIFICATION: 1130  RESPONSE:  No new orders

## 2020-12-04 NOTE — Telephone Encounter (Signed)
Patient seen by Dr. Sherrill today  Vitals are within treatment parameters.  Labs reviewed by Dr. Sherrill and are not all within treatment parameters.   Ok to treat per Dr Sherrill   Per physician team, patient is ready for treatment and there are NO modifications to the treatment plan.  

## 2020-12-04 NOTE — Progress Notes (Signed)
  [facility] OFFICE PROGRESS NOTE   Diagnosis: Hepatocellular carcinoma  INTERVAL HISTORY:   Stacy [name] returns as scheduled.  She completed another treatment with atezolizumab and bevacizumab on 11/10/2020.  She feels well.  Nosebleeding has resolved since the apixaban dose was decreased.  She has recently returned from a trip to the beach.  No rash or diarrhea.  Objective:  Vital signs in last 24 hours:  Blood pressure 116/73, pulse 76, temperature 98.7 F (37.1 C), temperature source Oral, resp. rate 18, height 5\' 2"  (1.575 m), weight 126 lb (57.2 kg), SpO2 100 %.   Resp: End inspiratory rales at the left lower posterior chest, no respiratory distress Cardio: Regular rate and rhythm GI: No hepatosplenomegaly Vascular: No leg edema  Lab Results:  Lab Results  Component Value Date   WBC 10.4 12/04/2020   HGB 11.3 (L) 12/04/2020   HCT 37.3 12/04/2020   MCV 82.2 12/04/2020   PLT 311 12/04/2020   NEUTROABS 6.0 12/04/2020    CMP  Lab Results  Component Value Date   NA 140 11/10/2020   K 4.4 11/10/2020   CL 106 11/10/2020   CO2 25 11/10/2020   GLUCOSE 135 (H) 11/10/2020   BUN 15 11/10/2020   CREATININE 0.92 11/10/2020   CALCIUM 9.3 11/10/2020   PROT 6.4 (L) 11/10/2020   ALBUMIN 3.3 (L) 11/10/2020   AST 163 (H) 11/10/2020   ALT 97 (H) 11/10/2020   ALKPHOS 226 (H) 11/10/2020   BILITOT 0.7 11/10/2020   GFRNONAA 58 (L) 11/10/2020   GFRAA 54 (L) 02/22/2020     Medications: I have reviewed the patient's current medications.   Assessment/Plan: Multiple liver masses CT abdomen/pelvis 07/11/2018, low-attenuation liver lesions incompletely characterized without contrast Ultrasound abdomen 07/10/2020-innumerable incompletely assessed hypoechoic masses in the liver MRI abdomen 07/29/2020- innumerable hypervascular liver masses, nodular liver contour, 1.5 cm left adrenal mass Biopsy liver lesion 08/08/2020-hepatocellular carcinoma, moderately  differentiated Cycle 1 atezolizumab/bevacizumab 08/18/2020 Cycle 2 atezolizumab/bevacizumab 09/07/2020 Cycle 3 atezolizumab/bevacizumab 09/28/2020 Cycle 4 atezolizumab/bevacizumab 10/19/2020 MRI liver 11/07/2020-numerous hyperenhancing liver masses-stable to mildly improved compared to the MRI 07/29/2020 Cycle 5 atezolizumab/bevacizumab 11/10/2020 Cycle 6 atezolizumab/bevacizumab 12/04/2020 Coronary artery disease Atrial fibrillation-maintained on anticoagulation History of basal cell and squamous cell skin cancers Diabetes Anorexia/weight loss Abdominal pain Urinary tract infection 08/30/2020     Disposition: Stacy [name] appears well.  She continues to tolerate the atezolizumab/bevacizumab well.  She will complete another cycle today.  There is no clinical evidence for progression of the hepatocellular carcinoma.  She will return for an office visit and treatment in 3 weeks.  We will plan for a repeat liver MRI after cycle 8.  Stacy Coder, MD  12/04/2020  11:14 AM

## 2020-12-06 DIAGNOSIS — L821 Other seborrheic keratosis: Secondary | ICD-10-CM | POA: Diagnosis not present

## 2020-12-06 DIAGNOSIS — Z85828 Personal history of other malignant neoplasm of skin: Secondary | ICD-10-CM | POA: Diagnosis not present

## 2020-12-06 DIAGNOSIS — L820 Inflamed seborrheic keratosis: Secondary | ICD-10-CM | POA: Diagnosis not present

## 2020-12-06 DIAGNOSIS — L570 Actinic keratosis: Secondary | ICD-10-CM | POA: Diagnosis not present

## 2020-12-06 DIAGNOSIS — D224 Melanocytic nevi of scalp and neck: Secondary | ICD-10-CM | POA: Diagnosis not present

## 2020-12-18 ENCOUNTER — Telehealth: Payer: Self-pay

## 2020-12-18 NOTE — Chronic Care Management (AMB) (Signed)
Chronic Care Management Pharmacy Assistant   Name: Stacy [name]  MRN: [id number] DOB: 1926/06/09   Reason for Encounter: General Adherence Call     Recent office visits:  10/31/20 Orders Only. Stacy Meeker MD. Ordered Macrobid 100 mg two times daily.   10/27/20 Stacy Meeker MD. Seen for Diabetes and HTN syndrome. No med changes.   Recent consult visits:  12/04/20 Stacy Coder MD. Seen for Hepatocellular Carcinoma. No med changes.  11/17/20 (Cardiology) Stacy Mocha MD. Seen for CAD. Decreased Apixaban from 5 mg 2 times daily to 2.5 mg 2 times daily.   11/10/20 (Oncology) Stacy Coder MD. Seen for Hepatocellular Carcinoma. No med changes.   10/19/20 (Oncology) Stacy Coder MD. Seen for Hepatocellular Carcinoma. No med changes.   09/28/20 (Oncology) Stacy Coder MD. Seen for Hepatocellular Carcinoma. D/C the following meds: Vitamin D 1000 units, Vitamin B12 1000 mcg, Docusate Sodium 100 mg, Jardiance 10 mg, Premarin 1 g, Atrovent 0.06%, Melatonin 10 mg and Omeprazole Magnesium 20 mg.    Hospital visits:  None   [name]: Outpatient Encounter [name] as of 12/18/2020  Medication Sig   ACCU-CHEK SMARTVIEW test strip 1 each by Other route as needed for other (blood sugar test strips).    apixaban (ELIQUIS) 2.5 MG TABS tablet Take 1 tablet (2.5 mg total) by mouth 2 (two) times daily.   cetirizine (ZYRTEC) 10 MG tablet TAKE 1 TABLET BY MOUTH EVERY DAY   diphenhydrAMINE (BENADRYL) 25 MG tablet Take 25 mg by mouth every 6 (six) hours as needed.   Famotidine-Ca Carb-Mag Hydrox (PEPCID COMPLETE PO) Take by mouth.   linaclotide (LINZESS) 72 MCG capsule Take 72 mcg by mouth daily. As needed   metFORMIN (GLUCOPHAGE) 500 MG tablet Take 500 mg by mouth daily with breakfast. Taking BID   metoprolol succinate (TOPROL XL) 25 MG 24 hr tablet Take 1 tablet (25 mg total) by mouth in the morning and at bedtime.   mometasone (ELOCON) 0.1 % cream mometasone 0.1 % topical cream   APPLY TO AFFECTED AREA EVERY DAY AS NEEDED ITCHING   NEOMYCIN-POLYMYXIN-HYDROCORTISONE (CORTISPORIN) 1 % SOLN otic solution Place 2 drops into both ears as directed.   nitrofurantoin, macrocrystal-monohydrate, (MACROBID) 100 MG capsule Take 1 capsule (100 mg total) by mouth 2 (two) times daily. (Patient not taking: No sig reported)   nitroGLYCERIN (NITROSTAT) 0.4 MG SL tablet Place 1 tablet (0.4 mg total) under the tongue every 5 (five) minutes as needed for chest pain.   ondansetron (ZOFRAN ODT) 4 MG disintegrating tablet Take 1 tablet (4 mg total) by mouth every 8 (eight) hours as needed for nausea or vomiting.   rosuvastatin (CRESTOR) 10 MG tablet TAKE 1 TABLET BY MOUTH EVERY DAY   sertraline (ZOLOFT) 50 MG tablet TAKE 1 TABLET BY MOUTH EVERY DAY   traMADol (ULTRAM) 50 MG tablet Take 0.5-1 tablets (25-50 mg total) by mouth every 12 (twelve) hours as needed.   No facility-administered encounter [name] on file as of 12/18/2020.    Contacted Stacy [name] for general disease state and medication adherence call.   Patient is not > 5 days past due for refill on the following [name] per chart history:  Stacy [name]: Medication Name/mg Last Fill Days Supply Rosuvastatin 10 mg  08/25/20 90ds Pt stated she has gotten a recent refill on this  Metformin 500 mg   07/28/20 90ds Reported no taking. BS are normal and pt is not on currently    What concerns do you have about your  [name]? They stated no concerns   The patient denies side effects with her [name].   How often do you forget or accidentally miss a dose? Never  Do you use a pillbox? Yes  Are you having any problems getting your [name] from your pharmacy? No  Has the cost of your [name] been a concern? No  Since last visit with CPP, no interventions have been made:   The patient has not had an ED visit since last contact.   The patient reports problems with their health. Pt daughter stated pt has liver  cancer and is undergoing treatment that she is handling very well.     Care Gaps: Last annual wellness visit? None noted  If applicable: Last eye exam / retinopathy screening? 11/17/19 Diabetic foot exam? 06/22/20    Stacy [name], [facility] Pharmacist Assistant  [phone]

## 2020-12-20 ENCOUNTER — Other Ambulatory Visit: Payer: Self-pay | Admitting: Legal Medicine

## 2020-12-20 NOTE — Telephone Encounter (Signed)
Patient non-compliant on Metformin, stated not supposed to be on despite it being on medslist. Will alert PCP and defer to them

## 2020-12-24 ENCOUNTER — Other Ambulatory Visit: Payer: Self-pay | Admitting: Oncology

## 2020-12-26 ENCOUNTER — Ambulatory Visit: Payer: Medicare Other | Attending: Internal Medicine

## 2020-12-26 ENCOUNTER — Inpatient Hospital Stay (HOSPITAL_BASED_OUTPATIENT_CLINIC_OR_DEPARTMENT_OTHER): Payer: Medicare Other | Admitting: Oncology

## 2020-12-26 ENCOUNTER — Encounter: Payer: Self-pay | Admitting: Oncology

## 2020-12-26 ENCOUNTER — Inpatient Hospital Stay: Payer: Medicare Other | Attending: Nurse Practitioner

## 2020-12-26 ENCOUNTER — Other Ambulatory Visit (HOSPITAL_BASED_OUTPATIENT_CLINIC_OR_DEPARTMENT_OTHER): Payer: Self-pay

## 2020-12-26 ENCOUNTER — Inpatient Hospital Stay: Payer: Medicare Other

## 2020-12-26 ENCOUNTER — Other Ambulatory Visit: Payer: Self-pay

## 2020-12-26 VITALS — BP 124/59

## 2020-12-26 VITALS — BP 125/62 | HR 85 | Temp 97.8°F | Resp 18 | Ht 62.0 in | Wt 127.0 lb

## 2020-12-26 DIAGNOSIS — Z85828 Personal history of other malignant neoplasm of skin: Secondary | ICD-10-CM | POA: Diagnosis not present

## 2020-12-26 DIAGNOSIS — R634 Abnormal weight loss: Secondary | ICD-10-CM | POA: Insufficient documentation

## 2020-12-26 DIAGNOSIS — R109 Unspecified abdominal pain: Secondary | ICD-10-CM | POA: Diagnosis not present

## 2020-12-26 DIAGNOSIS — R5383 Other fatigue: Secondary | ICD-10-CM

## 2020-12-26 DIAGNOSIS — Z23 Encounter for immunization: Secondary | ICD-10-CM

## 2020-12-26 DIAGNOSIS — Z5111 Encounter for antineoplastic chemotherapy: Secondary | ICD-10-CM | POA: Diagnosis not present

## 2020-12-26 DIAGNOSIS — Z5112 Encounter for antineoplastic immunotherapy: Secondary | ICD-10-CM | POA: Diagnosis present

## 2020-12-26 DIAGNOSIS — R630 Anorexia: Secondary | ICD-10-CM | POA: Insufficient documentation

## 2020-12-26 DIAGNOSIS — C220 Liver cell carcinoma: Secondary | ICD-10-CM

## 2020-12-26 DIAGNOSIS — E119 Type 2 diabetes mellitus without complications: Secondary | ICD-10-CM | POA: Insufficient documentation

## 2020-12-26 DIAGNOSIS — I2510 Atherosclerotic heart disease of native coronary artery without angina pectoris: Secondary | ICD-10-CM | POA: Insufficient documentation

## 2020-12-26 LAB — CMP (CANCER CENTER ONLY)
ALT: 144 U/L — ABNORMAL HIGH (ref 0–44)
AST: 233 U/L (ref 15–41)
Albumin: 3.3 g/dL — ABNORMAL LOW (ref 3.5–5.0)
Alkaline Phosphatase: 171 U/L — ABNORMAL HIGH (ref 38–126)
Anion gap: 10 (ref 5–15)
BUN: 15 mg/dL (ref 8–23)
CO2: 25 mmol/L (ref 22–32)
Calcium: 9.3 mg/dL (ref 8.9–10.3)
Chloride: 105 mmol/L (ref 98–111)
Creatinine: 1.13 mg/dL — ABNORMAL HIGH (ref 0.44–1.00)
GFR, Estimated: 46 mL/min — ABNORMAL LOW (ref >60)
Glucose, Bld: 213 mg/dL — ABNORMAL HIGH (ref 70–99)
Potassium: 3.8 mmol/L (ref 3.5–5.1)
Sodium: 140 mmol/L (ref 135–145)
Total Bilirubin: 0.7 mg/dL (ref 0.3–1.2)
Total Protein: 6.6 g/dL (ref 6.5–8.1)

## 2020-12-26 MED ORDER — SODIUM CHLORIDE 0.9 % IV SOLN
1200.0000 mg | Freq: Once | INTRAVENOUS | Status: AC
  Administered 2020-12-26: 1200 mg via INTRAVENOUS
  Filled 2020-12-26: qty 20

## 2020-12-26 MED ORDER — SODIUM CHLORIDE 0.9 % IV SOLN: Freq: Once | INTRAVENOUS | Status: AC

## 2020-12-26 MED ORDER — SODIUM CHLORIDE 0.9 % IV SOLN
15.0000 mg/kg | Freq: Once | INTRAVENOUS | Status: AC
  Administered 2020-12-26: 900 mg via INTRAVENOUS
  Filled 2020-12-26: qty 32

## 2020-12-26 MED ORDER — PFIZER COVID-19 VAC BIVALENT 30 MCG/0.3ML IM SUSP
INTRAMUSCULAR | 0 refills | Status: DC
  Filled 2020-12-26: qty 0.3, 1d supply, fill #0

## 2020-12-26 NOTE — Progress Notes (Signed)
CRITICAL VALUE STICKER  CRITICAL VALUE: AST 233  RECEIVER (on-site recipient of call):Bethel Gaglio,RN  DATE & TIME NOTIFIED: 12/26/20 2 1116  MESSENGER (representative from lab) Otila Kluver  MD NOTIFIED: [place]: 1120  RESPONSE: No changes. Being seen by provider today.

## 2020-12-26 NOTE — Progress Notes (Signed)
Patient presents for treatment. RN assessment completed along with the following:  Labs/vitals reviewed - Yes, and Dr Benay Spice aware of elevated ast 233 alt 144    Weight within 10% of previous measurement - Yes Oncology Treatment Attestation completed for current therapy- Yes, on date 08/12/20 Informed consent completed and reflects current therapy/intent - Yes, on date 08/18/20             Provider progress note reviewed - Yes, today's provider note was reviewed. Treatment/Antibody/Supportive plan reviewed - Yes, and there are no adjustments needed for today's treatment. S&H and other orders reviewed - Yes, and there are no additional orders identified. Previous treatment date reviewed - Yes, and the appropriate amount of time has elapsed between treatments. Clinic Hand Off Received from - Lupita Raider, RN  Patient to proceed with treatment.

## 2020-12-26 NOTE — Progress Notes (Signed)
  [facility] OFFICE PROGRESS NOTE   Diagnosis: Hepatocellular carcinoma  INTERVAL HISTORY:   [name] completed another cycle of atezolizumab and bevacizumab on 12/04/2020.  No rash or diarrhea.  She feels well.  Good appetite.  No pain.  No recurrent dysuria.  She has mild "hemorrhoid "bleeding with bowel movements.  No other bleeding.  Objective:  Vital signs in last 24 hours:  Blood pressure 125/62, pulse 85, temperature 97.8 F (36.6 C), temperature source Oral, resp. rate 18, height 5\' 2"  (1.575 m), weight 127 lb (57.6 kg), SpO2 100 %.    HEENT: No thrush or ulcers Resp: Lungs clear bilaterally Cardio: Regular rate and rhythm GI: Nontender, no hepatomegaly Vascular: No leg edema  Skin: No rash  Portacath/PICC-without erythema  Lab Results:  Lab Results  Component Value Date   WBC 10.4 12/04/2020   HGB 11.3 (L) 12/04/2020   HCT 37.3 12/04/2020   MCV 82.2 12/04/2020   PLT 311 12/04/2020   NEUTROABS 6.0 12/04/2020    CMP  Lab Results  Component Value Date   NA 140 12/26/2020   K 3.8 12/26/2020   CL 105 12/26/2020   CO2 25 12/26/2020   GLUCOSE 213 (H) 12/26/2020   BUN 15 12/26/2020   CREATININE 1.13 (H) 12/26/2020   CALCIUM 9.3 12/26/2020   PROT 6.6 12/26/2020   ALBUMIN 3.3 (L) 12/26/2020   AST 233 (HH) 12/26/2020   ALT 144 (H) 12/26/2020   ALKPHOS 171 (H) 12/26/2020   BILITOT 0.7 12/26/2020   GFRNONAA 46 (L) 12/26/2020   GFRAA 54 (L) 02/22/2020    Medications: I have reviewed the patient's current medications.   Assessment/Plan: Multiple liver masses CT abdomen/pelvis 07/11/2018, low-attenuation liver lesions incompletely characterized without contrast Ultrasound abdomen 07/10/2020-innumerable incompletely assessed hypoechoic masses in the liver MRI abdomen 07/29/2020- innumerable hypervascular liver masses, nodular liver contour, 1.5 cm left adrenal mass Biopsy liver lesion 08/08/2020-hepatocellular carcinoma, moderately  differentiated Cycle 1 atezolizumab/bevacizumab 08/18/2020 Cycle 2 atezolizumab/bevacizumab 09/07/2020 Cycle 3 atezolizumab/bevacizumab 09/28/2020 Cycle 4 atezolizumab/bevacizumab 10/19/2020 MRI liver 11/07/2020-numerous hyperenhancing liver masses-stable to mildly improved compared to the MRI 07/29/2020 Cycle 5 atezolizumab/bevacizumab 11/10/2020 Cycle 6 atezolizumab/bevacizumab 12/04/2020 Cycle 7 atezolizumab/bevacizumab 12/26/2020 Coronary artery disease Atrial fibrillation-maintained on anticoagulation History of basal cell and squamous cell skin cancers Diabetes Anorexia/weight loss Abdominal pain Urinary tract infection 08/30/2020      Disposition: Stacy [name] appears stable.  She is tolerating the treatment well.  She will complete another cycle today.  [name] will return for an office visit and atezolizumab/bevacizumab in 3 weeks.  We will plan for a restaging liver MRI in late December or early January.   [name], MD  12/26/2020  11:26 AM

## 2020-12-26 NOTE — Progress Notes (Signed)
   Covid-19 Vaccination Clinic  Name:  KATALENA MALVEAUX    MRN: [id number] DOB: 11-12-1926  12/26/2020  Ms. Brunke was observed post Covid-19 immunization for 15 minutes without incident. She was provided with Vaccine Information Sheet and instruction to access the V-Safe system.   Ms. Menzel was instructed to call 911 with any severe reactions post vaccine: Difficulty breathing  Swelling of face and throat  A fast heartbeat  A bad rash all over body  Dizziness and weakness   Immunizations Administered     Name Date Dose VIS Date Route   Pfizer Covid-19 Vaccine Bivalent Booster 12/26/2020  2:50 PM 0.3 mL 10/11/2020 Intramuscular   Manufacturer: [place]   Lot: [id number]   [place]: [phone]

## 2020-12-26 NOTE — Progress Notes (Signed)
Patient seen by Dr. Benay Spice today  Vitals are within treatment parameters.  Labs reviewed by Dr. Benay Spice ; Critical value AST 233 reviewed by Dr. Benay Spice. This is expected with her diagnosis and treatment.  It is okay to proceed with chemo treatment. CBC/urine for protein from 10/24 being used for today's treatment - reviewed and okay to treat per Dr. Benay Spice.   Per physician team, patient is ready for treatment and there are NO modifications to the treatment plan.   CBC and urine for protein will be obtained prior to next infusion.

## 2020-12-26 NOTE — Patient Instructions (Signed)
[facility]  Discharge Instructions: Thank you for choosing [facility] to provide your oncology and hematology care.   If you have a lab appointment with the [facility], please go directly to the [facility] and check in at the registration area.   Wear comfortable clothing and clothing appropriate for easy access to any Portacath or PICC line.   We strive to give you quality time with your provider. You may need to reschedule your appointment if you arrive late (15 or more minutes).  Arriving late affects you and other patients whose appointments are after yours.  Also, if you miss three or more appointments without notifying the office, you may be dismissed from the clinic at the provider's discretion.      For prescription refill requests, have your pharmacy contact our office and allow 72 hours for refills to be completed.    Today you received the following chemotherapy and/or immunotherapy agents Bevacizumab-awwb, Tecentirq      To help prevent nausea and vomiting after your treatment, we encourage you to take your nausea medication as directed.  BELOW ARE SYMPTOMS THAT SHOULD BE REPORTED IMMEDIATELY: *FEVER GREATER THAN 100.4 F (38 C) OR HIGHER *CHILLS OR SWEATING *NAUSEA AND VOMITING THAT IS NOT CONTROLLED WITH YOUR NAUSEA MEDICATION *UNUSUAL SHORTNESS OF BREATH *UNUSUAL BRUISING OR BLEEDING *URINARY PROBLEMS (pain or burning when urinating, or frequent urination) *BOWEL PROBLEMS (unusual diarrhea, constipation, pain near the anus) TENDERNESS IN MOUTH AND THROAT WITH OR WITHOUT PRESENCE OF ULCERS (sore throat, sores in mouth, or a toothache) UNUSUAL RASH, SWELLING OR PAIN  UNUSUAL VAGINAL DISCHARGE OR ITCHING   Items with * indicate a potential emergency and should be followed up as soon as possible or go to the Emergency Department if any problems should occur.  Please show the CHEMOTHERAPY ALERT CARD or IMMUNOTHERAPY ALERT CARD  at check-in to the Emergency Department and triage nurse.  Should you have questions after your visit or need to cancel or reschedule your appointment, please contact [facility]  Dept: [phone]  and follow the prompts.  Office hours are 8:00 a.m. to 4:30 p.m. Monday - Friday. Please note that voicemails left after 4:00 p.m. may not be returned until the following business day.  We are closed weekends and major holidays. You have access to a nurse at all times for urgent questions. Please call the main number to the clinic Dept: [phone] and follow the prompts.   For any non-urgent questions, you may also contact your provider using MyChart. We now offer e-Visits for anyone 66 and older to request care online for non-urgent symptoms. For details visit [email or web address].   Also download the MyChart app! Go to the app store, search "MyChart", open the app, select [facility], and log in with your MyChart username and password.  Due to Covid, a mask is required upon entering the hospital/clinic. If you do not have a mask, one will be given to you upon arrival. For doctor visits, patients may have 1 support person aged 60 or older with them. For treatment visits, patients cannot have anyone with them due to current Covid guidelines and our immunocompromised population.   Bevacizumab injection What is this medication? BEVACIZUMAB (be va SIZ yoo mab) is a monoclonal antibody. It is used to treat many types of cancer. This medicine may be used for other purposes; ask your health care provider or pharmacist if you have questions. COMMON BRAND NAME(S): Alymsys,  Avastin, MVASI, Noah Charon What should I tell my care team before I take this medication? They need to know if you have any of these conditions: diabetes heart disease high blood pressure history of coughing up blood prior anthracycline chemotherapy (e.g., doxorubicin, daunorubicin, epirubicin) recent or  ongoing radiation therapy recent or planning to have surgery stroke an unusual or allergic reaction to bevacizumab, hamster proteins, mouse proteins, other medicines, foods, dyes, or preservatives pregnant or trying to get pregnant breast-feeding How should I use this medication? This medicine is for infusion into a vein. It is given by a health care professional in a hospital or clinic setting. Talk to your pediatrician regarding the use of this medicine in children. Special care may be needed. Overdosage: If you think you have taken too much of this medicine contact a poison control center or emergency room at once. NOTE: This medicine is only for you. Do not share this medicine with others. What if I miss a dose? It is important not to miss your dose. Call your doctor or health care professional if you are unable to keep an appointment. What may interact with this medication? Interactions are not expected. This list may not describe all possible interactions. Give your health care provider a list of all the medicines, herbs, non-prescription drugs, or dietary supplements you use. Also tell them if you smoke, drink alcohol, or use illegal drugs. Some items may interact with your medicine. What should I watch for while using this medication? Your condition will be monitored carefully while you are receiving this medicine. You will need important blood work and urine testing done while you are taking this medicine. This medicine may increase your risk to bruise or bleed. Call your doctor or health care professional if you notice any unusual bleeding. Before having surgery, talk to your health care provider to make sure it is ok. This drug can increase the risk of poor healing of your surgical site or wound. You will need to stop this drug for 28 days before surgery. After surgery, wait at least 28 days before restarting this drug. Make sure the surgical site or wound is healed enough before  restarting this drug. Talk to your health care provider if questions. Do not become pregnant while taking this medicine or for 6 months after stopping it. Women should inform their doctor if they wish to become pregnant or think they might be pregnant. There is a potential for serious side effects to an unborn child. Talk to your health care professional or pharmacist for more information. Do not breast-feed an infant while taking this medicine and for 6 months after the last dose. This medicine has caused ovarian failure in some women. This medicine may interfere with the ability to have a child. You should talk to your doctor or health care professional if you are concerned about your fertility. What side effects may I notice from receiving this medication? Side effects that you should report to your doctor or health care professional as soon as possible: allergic reactions like skin rash, itching or hives, swelling of the face, lips, or tongue chest pain or chest tightness chills coughing up blood high fever seizures severe constipation signs and symptoms of bleeding such as bloody or black, tarry stools; red or dark-brown urine; spitting up blood or brown material that looks like coffee grounds; red spots on the skin; unusual bruising or bleeding from the eye, gums, or nose signs and symptoms of a blood clot such as breathing  problems; chest pain; severe, sudden headache; pain, swelling, warmth in the leg signs and symptoms of a stroke like changes in vision; confusion; trouble speaking or understanding; severe headaches; sudden numbness or weakness of the face, arm or leg; trouble walking; dizziness; loss of balance or coordination stomach pain sweating swelling of legs or ankles vomiting weight gain Side effects that usually do not require medical attention (report to your doctor or health care professional if they continue or are bothersome): back pain changes in taste decreased  appetite dry skin nausea tiredness This list may not describe all possible side effects. Call your doctor for medical advice about side effects. You may report side effects to FDA at 1-800-FDA-1088. Where should I keep my medication? This drug is given in a hospital or clinic and will not be stored at home. NOTE: This sheet is a summary. It may not cover all possible information. If you have questions about this medicine, talk to your doctor, pharmacist, or health care provider.  2022 Elsevier/Gold Standard (2020-10-17 00:00:00)  Atezolizumab injection What is this medication? ATEZOLIZUMAB (a te zoe LIZ ue mab) is a monoclonal antibody. It is used to treat bladder cancer (urothelial cancer), liver cancer, lung cancer, and melanoma. This medicine may be used for other purposes; ask your health care provider or pharmacist if you have questions. COMMON BRAND NAME(S): Tecentriq What should I tell my care team before I take this medication? They need to know if you have any of these conditions: autoimmune diseases like Crohn's disease, ulcerative colitis, or lupus have had or planning to have an allogeneic stem cell transplant (uses someone else's stem cells) history of organ transplant history of radiation to the chest nervous system problems like myasthenia gravis or Guillain-Barre syndrome an unusual or allergic reaction to atezolizumab, other medicines, foods, dyes, or preservatives pregnant or trying to get pregnant breast-feeding How should I use this medication? This medicine is for infusion into a vein. It is given by a health care professional in a hospital or clinic setting. A special MedGuide will be given to you before each treatment. Be sure to read this information carefully each time. Talk to your pediatrician regarding the use of this medicine in children. Special care may be needed. Overdosage: If you think you have taken too much of this medicine contact a poison control  center or emergency room at once. NOTE: This medicine is only for you. Do not share this medicine with others. What if I miss a dose? It is important not to miss your dose. Call your doctor or health care professional if you are unable to keep an appointment. What may interact with this medication? Interactions have not been studied. This list may not describe all possible interactions. Give your health care provider a list of all the medicines, herbs, non-prescription drugs, or dietary supplements you use. Also tell them if you smoke, drink alcohol, or use illegal drugs. Some items may interact with your medicine. What should I watch for while using this medication? Your condition will be monitored carefully while you are receiving this medicine. You may need blood work done while you are taking this medicine. Do not become pregnant while taking this medicine or for at least 5 months after stopping it. Women should inform their doctor if they wish to become pregnant or think they might be pregnant. There is a potential for serious side effects to an unborn child. Talk to your health care professional or pharmacist for more information. Do not  breast-feed an infant while taking this medicine or for at least 5 months after the last dose. What side effects may I notice from receiving this medication? Side effects that you should report to your doctor or health care professional as soon as possible: allergic reactions like skin rash, itching or hives, swelling of the face, lips, or tongue black, tarry stools bloody or watery diarrhea breathing problems changes in vision chest pain or chest tightness chills facial flushing fever headache signs and symptoms of high blood sugar such as dizziness; dry mouth; dry skin; fruity breath; nausea; stomach pain; increased hunger or thirst; increased urination signs and symptoms of liver injury like dark yellow or brown urine; general ill feeling or flu-like  symptoms; light-colored stools; loss of appetite; nausea; right upper belly pain; unusually weak or tired; yellowing of the eyes or skin stomach pain trouble passing urine or change in the amount of urine Side effects that usually do not require medical attention (report to your doctor or health care professional if they continue or are bothersome): bone pain cough diarrhea joint pain muscle pain muscle weakness swelling of arms or legs tiredness weight loss This list may not describe all possible side effects. Call your doctor for medical advice about side effects. You may report side effects to FDA at 1-800-FDA-1088. Where should I keep my medication? This drug is given in a hospital or clinic and will not be stored at home. NOTE: This sheet is a summary. It may not cover all possible information. If you have questions about this medicine, talk to your doctor, pharmacist, or health care provider.  2022 Elsevier/Gold Standard (2020-10-17 00:00:00)

## 2020-12-27 ENCOUNTER — Other Ambulatory Visit (HOSPITAL_BASED_OUTPATIENT_CLINIC_OR_DEPARTMENT_OTHER): Payer: Self-pay

## 2020-12-27 ENCOUNTER — Encounter: Payer: Self-pay | Admitting: Oncology

## 2020-12-28 DIAGNOSIS — E042 Nontoxic multinodular goiter: Secondary | ICD-10-CM | POA: Diagnosis not present

## 2020-12-28 DIAGNOSIS — I2510 Atherosclerotic heart disease of native coronary artery without angina pectoris: Secondary | ICD-10-CM | POA: Diagnosis not present

## 2020-12-28 DIAGNOSIS — E538 Deficiency of other specified B group vitamins: Secondary | ICD-10-CM | POA: Diagnosis not present

## 2020-12-28 DIAGNOSIS — E1151 Type 2 diabetes mellitus with diabetic peripheral angiopathy without gangrene: Secondary | ICD-10-CM | POA: Diagnosis not present

## 2020-12-28 DIAGNOSIS — M8580 Other specified disorders of bone density and structure, unspecified site: Secondary | ICD-10-CM | POA: Diagnosis not present

## 2020-12-28 DIAGNOSIS — R197 Diarrhea, unspecified: Secondary | ICD-10-CM | POA: Diagnosis not present

## 2021-01-14 ENCOUNTER — Other Ambulatory Visit: Payer: Self-pay | Admitting: Oncology

## 2021-01-14 ENCOUNTER — Other Ambulatory Visit: Payer: Self-pay | Admitting: Legal Medicine

## 2021-01-14 DIAGNOSIS — F321 Major depressive disorder, single episode, moderate: Secondary | ICD-10-CM

## 2021-01-16 ENCOUNTER — Inpatient Hospital Stay: Payer: Medicare Other

## 2021-01-16 ENCOUNTER — Inpatient Hospital Stay: Payer: Medicare Other | Attending: Nurse Practitioner

## 2021-01-16 ENCOUNTER — Other Ambulatory Visit: Payer: Self-pay

## 2021-01-16 ENCOUNTER — Inpatient Hospital Stay (HOSPITAL_BASED_OUTPATIENT_CLINIC_OR_DEPARTMENT_OTHER): Payer: Medicare Other | Admitting: Oncology

## 2021-01-16 VITALS — BP 121/65 | HR 62

## 2021-01-16 VITALS — BP 123/63 | HR 63 | Temp 98.1°F | Resp 18 | Ht 62.0 in | Wt 126.0 lb

## 2021-01-16 DIAGNOSIS — Z87440 Personal history of urinary (tract) infections: Secondary | ICD-10-CM | POA: Diagnosis not present

## 2021-01-16 DIAGNOSIS — Z5112 Encounter for antineoplastic immunotherapy: Secondary | ICD-10-CM | POA: Insufficient documentation

## 2021-01-16 DIAGNOSIS — R197 Diarrhea, unspecified: Secondary | ICD-10-CM | POA: Diagnosis not present

## 2021-01-16 DIAGNOSIS — R630 Anorexia: Secondary | ICD-10-CM | POA: Diagnosis not present

## 2021-01-16 DIAGNOSIS — I2510 Atherosclerotic heart disease of native coronary artery without angina pectoris: Secondary | ICD-10-CM | POA: Insufficient documentation

## 2021-01-16 DIAGNOSIS — R634 Abnormal weight loss: Secondary | ICD-10-CM | POA: Insufficient documentation

## 2021-01-16 DIAGNOSIS — I4891 Unspecified atrial fibrillation: Secondary | ICD-10-CM | POA: Insufficient documentation

## 2021-01-16 DIAGNOSIS — Z85828 Personal history of other malignant neoplasm of skin: Secondary | ICD-10-CM | POA: Diagnosis not present

## 2021-01-16 DIAGNOSIS — E119 Type 2 diabetes mellitus without complications: Secondary | ICD-10-CM | POA: Diagnosis not present

## 2021-01-16 DIAGNOSIS — R109 Unspecified abdominal pain: Secondary | ICD-10-CM | POA: Diagnosis not present

## 2021-01-16 DIAGNOSIS — C220 Liver cell carcinoma: Secondary | ICD-10-CM

## 2021-01-16 DIAGNOSIS — R5383 Other fatigue: Secondary | ICD-10-CM

## 2021-01-16 LAB — CMP (CANCER CENTER ONLY)
ALT: 69 U/L — ABNORMAL HIGH (ref 0–44)
AST: 131 U/L — ABNORMAL HIGH (ref 15–41)
Albumin: 3.3 g/dL — ABNORMAL LOW (ref 3.5–5.0)
Alkaline Phosphatase: 155 U/L — ABNORMAL HIGH (ref 38–126)
Anion gap: 9 (ref 5–15)
BUN: 13 mg/dL (ref 8–23)
CO2: 26 mmol/L (ref 22–32)
Calcium: 9.5 mg/dL (ref 8.9–10.3)
Chloride: 106 mmol/L (ref 98–111)
Creatinine: 1.03 mg/dL — ABNORMAL HIGH (ref 0.44–1.00)
GFR, Estimated: 51 mL/min — ABNORMAL LOW (ref >60)
Glucose, Bld: 190 mg/dL — ABNORMAL HIGH (ref 70–99)
Potassium: 3.6 mmol/L (ref 3.5–5.1)
Sodium: 141 mmol/L (ref 135–145)
Total Bilirubin: 0.6 mg/dL (ref 0.3–1.2)
Total Protein: 6.1 g/dL — ABNORMAL LOW (ref 6.5–8.1)

## 2021-01-16 LAB — TSH: TSH: 4.31 u[IU]/mL (ref 0.350–4.500)

## 2021-01-16 LAB — TOTAL PROTEIN, URINE DIPSTICK

## 2021-01-16 MED ORDER — SODIUM CHLORIDE 0.9 % IV SOLN
1200.0000 mg | Freq: Once | INTRAVENOUS | Status: AC
  Administered 2021-01-16: 1200 mg via INTRAVENOUS
  Filled 2021-01-16: qty 20

## 2021-01-16 MED ORDER — SODIUM CHLORIDE 0.9 % IV SOLN
15.0000 mg/kg | Freq: Once | INTRAVENOUS | Status: AC
  Administered 2021-01-16: 900 mg via INTRAVENOUS
  Filled 2021-01-16: qty 32

## 2021-01-16 MED ORDER — SODIUM CHLORIDE 0.9 % IV SOLN: Freq: Once | INTRAVENOUS | Status: AC

## 2021-01-16 NOTE — Patient Instructions (Signed)
Stacy [name]   Discharge Instructions: Thank you for choosing [facility] to provide your oncology and hematology care.   If you have a lab appointment with the [facility], please go directly to the [facility] and check in at the registration area.   Wear comfortable clothing and clothing appropriate for easy access to any Portacath or PICC line.   We strive to give you quality time with your provider. You may need to reschedule your appointment if you arrive late (15 or more minutes).  Arriving late affects you and other patients whose appointments are after yours.  Also, if you miss three or more appointments without notifying the office, you may be dismissed from the clinic at the provider's discretion.      For prescription refill requests, have your pharmacy contact our office and allow 72 hours for refills to be completed.    Today you received the following chemotherapy and/or immunotherapy agents Bevacizumab-bvzr, Atezolizumab      To help prevent nausea and vomiting after your treatment, we encourage you to take your nausea medication as directed.  BELOW ARE SYMPTOMS THAT SHOULD BE REPORTED IMMEDIATELY: *FEVER GREATER THAN 100.4 F (38 C) OR HIGHER *CHILLS OR SWEATING *NAUSEA AND VOMITING THAT IS NOT CONTROLLED WITH YOUR NAUSEA MEDICATION *UNUSUAL SHORTNESS OF BREATH *UNUSUAL BRUISING OR BLEEDING *URINARY PROBLEMS (pain or burning when urinating, or frequent urination) *BOWEL PROBLEMS (unusual diarrhea, constipation, pain near the anus) TENDERNESS IN MOUTH AND THROAT WITH OR WITHOUT PRESENCE OF ULCERS (sore throat, sores in mouth, or a toothache) UNUSUAL RASH, SWELLING OR PAIN  UNUSUAL VAGINAL DISCHARGE OR ITCHING   Items with * indicate a potential emergency and should be followed up as soon as possible or go to the Emergency Department if any problems should occur.  Please show the CHEMOTHERAPY ALERT CARD or IMMUNOTHERAPY ALERT  CARD at check-in to the Emergency Department and triage nurse.  Should you have questions after your visit or need to cancel or reschedule your appointment, please contact [facility]  Dept: [phone]  and follow the prompts.  Office hours are 8:00 a.m. to 4:30 p.m. Monday - Friday. Please note that voicemails left after 4:00 p.m. may not be returned until the following business day.  We are closed weekends and major holidays. You have access to a nurse at all times for urgent questions. Please call the main number to the clinic Dept: [phone] and follow the prompts.   For any non-urgent questions, you may also contact your provider using MyChart. We now offer e-Visits for anyone 90 and older to request care online for non-urgent symptoms. For details visit [email or web address].   Also download the MyChart app! Go to the app store, search "MyChart", open the app, select [facility], and log in with your MyChart username and password.  Due to Covid, a mask is required upon entering the hospital/clinic. If you do not have a mask, one will be given to you upon arrival. For doctor visits, patients may have 1 support person aged 24 or older with them. For treatment visits, patients cannot have anyone with them due to current Covid guidelines and our immunocompromised population.   Bevacizumab injection What is this medication? BEVACIZUMAB (be va SIZ yoo mab) is a monoclonal antibody. It is used to treat many types of cancer. This medicine may be used for other purposes; ask your health care provider or pharmacist if you have questions. COMMON BRAND NAME(S):  Alymsys, Avastin, MVASI, Noah Charon What should I tell my care team before I take this medication? They need to know if you have any of these conditions: diabetes heart disease high blood pressure history of coughing up blood prior anthracycline chemotherapy (e.g., doxorubicin, daunorubicin, epirubicin) recent  or ongoing radiation therapy recent or planning to have surgery stroke an unusual or allergic reaction to bevacizumab, hamster proteins, mouse proteins, other medicines, foods, dyes, or preservatives pregnant or trying to get pregnant breast-feeding How should I use this medication? This medicine is for infusion into a vein. It is given by a health care professional in a hospital or clinic setting. Talk to your pediatrician regarding the use of this medicine in children. Special care may be needed. Overdosage: If you think you have taken too much of this medicine contact a poison control center or emergency room at once. NOTE: This medicine is only for you. Do not share this medicine with others. What if I miss a dose? It is important not to miss your dose. Call your doctor or health care professional if you are unable to keep an appointment. What may interact with this medication? Interactions are not expected. This list may not describe all possible interactions. Give your health care provider a list of all the medicines, herbs, non-prescription drugs, or dietary supplements you use. Also tell them if you smoke, drink alcohol, or use illegal drugs. Some items may interact with your medicine. What should I watch for while using this medication? Your condition will be monitored carefully while you are receiving this medicine. You will need important blood work and urine testing done while you are taking this medicine. This medicine may increase your risk to bruise or bleed. Call your doctor or health care professional if you notice any unusual bleeding. Before having surgery, talk to your health care provider to make sure it is ok. This drug can increase the risk of poor healing of your surgical site or wound. You will need to stop this drug for 28 days before surgery. After surgery, wait at least 28 days before restarting this drug. Make sure the surgical site or wound is healed enough before  restarting this drug. Talk to your health care provider if questions. Do not become pregnant while taking this medicine or for 6 months after stopping it. Women should inform their doctor if they wish to become pregnant or think they might be pregnant. There is a potential for serious side effects to an unborn child. Talk to your health care professional or pharmacist for more information. Do not breast-feed an infant while taking this medicine and for 6 months after the last dose. This medicine has caused ovarian failure in some women. This medicine may interfere with the ability to have a child. You should talk to your doctor or health care professional if you are concerned about your fertility. What side effects may I notice from receiving this medication? Side effects that you should report to your doctor or health care professional as soon as possible: allergic reactions like skin rash, itching or hives, swelling of the face, lips, or tongue chest pain or chest tightness chills coughing up blood high fever seizures severe constipation signs and symptoms of bleeding such as bloody or black, tarry stools; red or dark-brown urine; spitting up blood or brown material that looks like coffee grounds; red spots on the skin; unusual bruising or bleeding from the eye, gums, or nose signs and symptoms of a blood clot such as  breathing problems; chest pain; severe, sudden headache; pain, swelling, warmth in the leg signs and symptoms of a stroke like changes in vision; confusion; trouble speaking or understanding; severe headaches; sudden numbness or weakness of the face, arm or leg; trouble walking; dizziness; loss of balance or coordination stomach pain sweating swelling of legs or ankles vomiting weight gain Side effects that usually do not require medical attention (report to your doctor or health care professional if they continue or are bothersome): back pain changes in taste decreased  appetite dry skin nausea tiredness This list may not describe all possible side effects. Call your doctor for medical advice about side effects. You may report side effects to FDA at 1-800-FDA-1088. Where should I keep my medication? This drug is given in a hospital or clinic and will not be stored at home. NOTE: This sheet is a summary. It may not cover all possible information. If you have questions about this medicine, talk to your doctor, pharmacist, or health care provider.  2022 Elsevier/Gold Standard (2020-10-17 00:00:00)  Atezolizumab injection What is this medication? ATEZOLIZUMAB (a te zoe LIZ ue mab) is a monoclonal antibody. It is used to treat bladder cancer (urothelial cancer), liver cancer, lung cancer, and melanoma. This medicine may be used for other purposes; ask your health care provider or pharmacist if you have questions. COMMON BRAND NAME(S): Tecentriq What should I tell my care team before I take this medication? They need to know if you have any of these conditions: autoimmune diseases like Crohn's disease, ulcerative colitis, or lupus have had or planning to have an allogeneic stem cell transplant (uses someone else's stem cells) history of organ transplant history of radiation to the chest nervous system problems like myasthenia gravis or Guillain-Barre syndrome an unusual or allergic reaction to atezolizumab, other medicines, foods, dyes, or preservatives pregnant or trying to get pregnant breast-feeding How should I use this medication? This medicine is for infusion into a vein. It is given by a health care professional in a hospital or clinic setting. A special MedGuide will be given to you before each treatment. Be sure to read this information carefully each time. Talk to your pediatrician regarding the use of this medicine in children. Special care may be needed. Overdosage: If you think you have taken too much of this medicine contact a poison control  center or emergency room at once. NOTE: This medicine is only for you. Do not share this medicine with others. What if I miss a dose? It is important not to miss your dose. Call your doctor or health care professional if you are unable to keep an appointment. What may interact with this medication? Interactions have not been studied. This list may not describe all possible interactions. Give your health care provider a list of all the medicines, herbs, non-prescription drugs, or dietary supplements you use. Also tell them if you smoke, drink alcohol, or use illegal drugs. Some items may interact with your medicine. What should I watch for while using this medication? Your condition will be monitored carefully while you are receiving this medicine. You may need blood work done while you are taking this medicine. Do not become pregnant while taking this medicine or for at least 5 months after stopping it. Women should inform their doctor if they wish to become pregnant or think they might be pregnant. There is a potential for serious side effects to an unborn child. Talk to your health care professional or pharmacist for more information. Do  not breast-feed an infant while taking this medicine or for at least 5 months after the last dose. What side effects may I notice from receiving this medication? Side effects that you should report to your doctor or health care professional as soon as possible: allergic reactions like skin rash, itching or hives, swelling of the face, lips, or tongue black, tarry stools bloody or watery diarrhea breathing problems changes in vision chest pain or chest tightness chills facial flushing fever headache signs and symptoms of high blood sugar such as dizziness; dry mouth; dry skin; fruity breath; nausea; stomach pain; increased hunger or thirst; increased urination signs and symptoms of liver injury like dark yellow or brown urine; general ill feeling or flu-like  symptoms; light-colored stools; loss of appetite; nausea; right upper belly pain; unusually weak or tired; yellowing of the eyes or skin stomach pain trouble passing urine or change in the amount of urine Side effects that usually do not require medical attention (report to your doctor or health care professional if they continue or are bothersome): bone pain cough diarrhea joint pain muscle pain muscle weakness swelling of arms or legs tiredness weight loss This list may not describe all possible side effects. Call your doctor for medical advice about side effects. You may report side effects to FDA at 1-800-FDA-1088. Where should I keep my medication? This drug is given in a hospital or clinic and will not be stored at home. NOTE: This sheet is a summary. It may not cover all possible information. If you have questions about this medicine, talk to your doctor, pharmacist, or health care provider.  2022 Elsevier/Gold Standard (2020-10-17 00:00:00)

## 2021-01-16 NOTE — Progress Notes (Signed)
  [facility] OFFICE PROGRESS NOTE   Diagnosis: Hepatocellular carcinoma  INTERVAL HISTORY:   Stacy [name] completed another treatment with atezolizumab and bevacizumab on 12/26/2020.  No rash or diarrhea.  No bleeding.  No pain.  No dysuria.  Objective:  Vital signs in last 24 hours:  Blood pressure 123/63, pulse 63, temperature 98.1 F (36.7 C), temperature source Oral, resp. rate 18, height 5\' 2"  (1.575 m), weight 126 lb (57.2 kg), SpO2 100 %.    HEENT: No thrush or ulcers Resp: Lungs clear bilaterally Cardio: Irregular GI: No hepatosplenomegaly, nontender Vascular: No leg edema     Lab Results:  Lab Results  Component Value Date   WBC 10.4 12/04/2020   HGB 11.3 (L) 12/04/2020   HCT 37.3 12/04/2020   MCV 82.2 12/04/2020   PLT 311 12/04/2020   NEUTROABS 6.0 12/04/2020    CMP  Lab Results  Component Value Date   NA 141 01/16/2021   K 3.6 01/16/2021   CL 106 01/16/2021   CO2 26 01/16/2021   GLUCOSE 190 (H) 01/16/2021   BUN 13 01/16/2021   CREATININE 1.03 (H) 01/16/2021   CALCIUM 9.5 01/16/2021   PROT 6.1 (L) 01/16/2021   ALBUMIN 3.3 (L) 01/16/2021   AST 131 (H) 01/16/2021   ALT 69 (H) 01/16/2021   ALKPHOS 155 (H) 01/16/2021   BILITOT 0.6 01/16/2021   GFRNONAA 51 (L) 01/16/2021   GFRAA 54 (L) 02/22/2020     Medications: I have reviewed the patient's current medications.   Assessment/Plan: Multiple liver masses CT abdomen/pelvis 07/11/2018, low-attenuation liver lesions incompletely characterized without contrast Ultrasound abdomen 07/10/2020-innumerable incompletely assessed hypoechoic masses in the liver MRI abdomen 07/29/2020- innumerable hypervascular liver masses, nodular liver contour, 1.5 cm left adrenal mass Biopsy liver lesion 08/08/2020-hepatocellular carcinoma, moderately differentiated Cycle 1 atezolizumab/bevacizumab 08/18/2020 Cycle 2 atezolizumab/bevacizumab 09/07/2020 Cycle 3 atezolizumab/bevacizumab 09/28/2020 Cycle 4  atezolizumab/bevacizumab 10/19/2020 MRI liver 11/07/2020-numerous hyperenhancing liver masses-stable to mildly improved compared to the MRI 07/29/2020 Cycle 5 atezolizumab/bevacizumab 11/10/2020 Cycle 6 atezolizumab/bevacizumab 12/04/2020 Cycle 7 atezolizumab/bevacizumab 12/26/2020 Cycle 8 atezolizumab/bevacizumab 01/16/2021 Coronary artery disease Atrial fibrillation-maintained on anticoagulation History of basal cell and squamous cell skin cancers Diabetes Anorexia/weight loss Abdominal pain Urinary tract infection 08/30/2020       Disposition: Stacy [name] appears stable.  She is tolerating the atezolizumab/bevacizumab well.  She will complete another cycle today.  She will return for an office visit and treatment in 3 weeks.  She will be scheduled for a restaging MRI of the liver in January.  Stacy Coder, MD  01/16/2021  11:07 AM

## 2021-01-16 NOTE — Progress Notes (Signed)
Patient presents for treatment. RN assessment completed along with the following:  Labs/vitals reviewed - Yes, and within treatment parameters.  OK to treat without CBC today per note from Cristy Friedlander, RN Weight within 10% of previous measurement - Yes Oncology Treatment Attestation completed for current therapy- Yes, on date 08/12/20 Informed consent completed and reflects current therapy/intent - Yes, on date 12/04/20             Provider progress note reviewed - Yes, today's provider note was reviewed. Treatment/Antibody/Supportive plan reviewed - Yes, and there are no adjustments needed for today's treatment. S&H and other orders reviewed - Yes, and there are no additional orders identified. Previous treatment date reviewed - Yes, and the appropriate amount of time has elapsed between treatments. Clinic Hand Off Received from - Cristy Friedlander, RN  Patient to proceed with treatment.

## 2021-01-16 NOTE — Progress Notes (Signed)
Patient seen by Dr. Benay Spice today  Vitals are within treatment parameters.  Labs reviewed by Dr. Benay Spice and are within treatment parameters. No need for CBC  Per physician team, patient is ready for treatment and there are NO modifications to the treatment plan.

## 2021-02-05 ENCOUNTER — Other Ambulatory Visit: Payer: Self-pay | Admitting: Oncology

## 2021-02-06 ENCOUNTER — Other Ambulatory Visit: Payer: Self-pay

## 2021-02-06 ENCOUNTER — Telehealth: Payer: Self-pay

## 2021-02-06 ENCOUNTER — Inpatient Hospital Stay: Payer: Medicare Other

## 2021-02-06 ENCOUNTER — Inpatient Hospital Stay (HOSPITAL_BASED_OUTPATIENT_CLINIC_OR_DEPARTMENT_OTHER): Payer: Medicare Other | Admitting: Oncology

## 2021-02-06 VITALS — BP 114/66 | HR 78 | Temp 97.7°F | Resp 18 | Ht 62.0 in | Wt 122.0 lb

## 2021-02-06 VITALS — BP 128/60 | HR 60 | Temp 98.0°F | Resp 20

## 2021-02-06 DIAGNOSIS — C220 Liver cell carcinoma: Secondary | ICD-10-CM

## 2021-02-06 DIAGNOSIS — E119 Type 2 diabetes mellitus without complications: Secondary | ICD-10-CM | POA: Diagnosis not present

## 2021-02-06 DIAGNOSIS — I2510 Atherosclerotic heart disease of native coronary artery without angina pectoris: Secondary | ICD-10-CM | POA: Diagnosis not present

## 2021-02-06 DIAGNOSIS — Z5112 Encounter for antineoplastic immunotherapy: Secondary | ICD-10-CM | POA: Diagnosis not present

## 2021-02-06 DIAGNOSIS — I4891 Unspecified atrial fibrillation: Secondary | ICD-10-CM | POA: Diagnosis not present

## 2021-02-06 DIAGNOSIS — R197 Diarrhea, unspecified: Secondary | ICD-10-CM | POA: Diagnosis not present

## 2021-02-06 LAB — CBC WITH DIFFERENTIAL (CANCER CENTER ONLY)
Abs Immature Granulocytes: 0.01 10*3/uL (ref 0.00–0.07)
Basophils Absolute: 0.1 10*3/uL (ref 0.0–0.1)
Basophils Relative: 1 %
Eosinophils Absolute: 0.6 10*3/uL — ABNORMAL HIGH (ref 0.0–0.5)
Eosinophils Relative: 9 %
HCT: 40 % (ref 36.0–46.0)
Hemoglobin: 11.7 g/dL — ABNORMAL LOW (ref 12.0–15.0)
Immature Granulocytes: 0 %
Lymphocytes Relative: 40 %
Lymphs Abs: 2.9 10*3/uL (ref 0.7–4.0)
MCH: 23.1 pg — ABNORMAL LOW (ref 26.0–34.0)
MCHC: 29.3 g/dL — ABNORMAL LOW (ref 30.0–36.0)
MCV: 78.9 fL — ABNORMAL LOW (ref 80.0–100.0)
Monocytes Absolute: 0.7 10*3/uL (ref 0.1–1.0)
Monocytes Relative: 9 %
Neutro Abs: 3 10*3/uL (ref 1.7–7.7)
Neutrophils Relative %: 41 %
Platelet Count: 239 10*3/uL (ref 150–400)
RBC: 5.07 MIL/uL (ref 3.87–5.11)
RDW: 16.3 % — ABNORMAL HIGH (ref 11.5–15.5)
WBC Count: 7.2 10*3/uL (ref 4.0–10.5)
nRBC: 0 % (ref 0.0–0.2)

## 2021-02-06 LAB — CMP (CANCER CENTER ONLY)
ALT: 84 U/L — ABNORMAL HIGH (ref 0–44)
AST: 159 U/L — ABNORMAL HIGH (ref 15–41)
Albumin: 3.5 g/dL (ref 3.5–5.0)
Alkaline Phosphatase: 167 U/L — ABNORMAL HIGH (ref 38–126)
Anion gap: 9 (ref 5–15)
BUN: 16 mg/dL (ref 8–23)
CO2: 28 mmol/L (ref 22–32)
Calcium: 9.4 mg/dL (ref 8.9–10.3)
Chloride: 103 mmol/L (ref 98–111)
Creatinine: 1.22 mg/dL — ABNORMAL HIGH (ref 0.44–1.00)
GFR, Estimated: 42 mL/min — ABNORMAL LOW (ref >60)
Glucose, Bld: 213 mg/dL — ABNORMAL HIGH (ref 70–99)
Potassium: 4 mmol/L (ref 3.5–5.1)
Sodium: 140 mmol/L (ref 135–145)
Total Bilirubin: 0.7 mg/dL (ref 0.3–1.2)
Total Protein: 6.8 g/dL (ref 6.5–8.1)

## 2021-02-06 MED ORDER — SODIUM CHLORIDE 0.9 % IV SOLN: Freq: Once | INTRAVENOUS | Status: AC

## 2021-02-06 MED ORDER — SODIUM CHLORIDE 0.9 % IV SOLN
15.0000 mg/kg | Freq: Once | INTRAVENOUS | Status: AC
  Administered 2021-02-06: 14:00:00 900 mg via INTRAVENOUS
  Filled 2021-02-06: qty 32

## 2021-02-06 MED ORDER — SODIUM CHLORIDE 0.9 % IV SOLN
1200.0000 mg | Freq: Once | INTRAVENOUS | Status: AC
  Administered 2021-02-06: 13:00:00 1200 mg via INTRAVENOUS
  Filled 2021-02-06: qty 20

## 2021-02-06 NOTE — Progress Notes (Signed)
°  [facility] OFFICE PROGRESS NOTE   Diagnosis: Hepatocellular carcinoma  INTERVAL HISTORY:   Stacy [name] completed another treatment with atezolizumab and bevacizumab on 01/16/2021.  No rash or bleeding.  Occasional diarrhea, but this is not consistent.  No pain.  Good appetite.  She feels well.  No dysuria.  Objective:  Vital signs in last 24 hours:  Blood pressure 114/66, pulse 78, temperature 97.7 F (36.5 C), temperature source Oral, resp. rate 18, height 5\' 2"  (1.575 m), weight 122 lb (55.3 kg), SpO2 100 %.    HEENT: No thrush or ulcers Resp: Lungs clear bilaterally Cardio: Regular rhythm with premature beats GI: No hepatosplenomegaly, nontender Vascular: No leg edema  Skin: No rash  Portacath/PICC-without erythema  Lab Results:  Lab Results  Component Value Date   WBC 10.4 12/04/2020   HGB 11.3 (L) 12/04/2020   HCT 37.3 12/04/2020   MCV 82.2 12/04/2020   PLT 311 12/04/2020   NEUTROABS 6.0 12/04/2020    CMP  Lab Results  Component Value Date   NA 140 02/06/2021   K 4.0 02/06/2021   CL 103 02/06/2021   CO2 28 02/06/2021   GLUCOSE 213 (H) 02/06/2021   BUN 16 02/06/2021   CREATININE 1.22 (H) 02/06/2021   CALCIUM 9.4 02/06/2021   PROT 6.8 02/06/2021   ALBUMIN 3.5 02/06/2021   AST 159 (H) 02/06/2021   ALT 84 (H) 02/06/2021   ALKPHOS 167 (H) 02/06/2021   BILITOT 0.7 02/06/2021   GFRNONAA 42 (L) 02/06/2021   GFRAA 54 (L) 02/22/2020    Lab Results  Component Value Date   CEA 2.45 08/02/2020   [id number] 27 08/02/2020    Lab Results  Component Value Date   INR 1.0 08/08/2020   LABPROT 12.9 08/08/2020    Imaging:  No results found.  Medications: I have reviewed the patient's current medications.   Assessment/Plan: Multiple liver masses CT abdomen/pelvis 07/11/2018, low-attenuation liver lesions incompletely characterized without contrast Ultrasound abdomen 07/10/2020-innumerable incompletely assessed hypoechoic masses in the  liver MRI abdomen 07/29/2020- innumerable hypervascular liver masses, nodular liver contour, 1.5 cm left adrenal mass Biopsy liver lesion 08/08/2020-hepatocellular carcinoma, moderately differentiated Cycle 1 atezolizumab/bevacizumab 08/18/2020 Cycle 2 atezolizumab/bevacizumab 09/07/2020 Cycle 3 atezolizumab/bevacizumab 09/28/2020 Cycle 4 atezolizumab/bevacizumab 10/19/2020 MRI liver 11/07/2020-numerous hyperenhancing liver masses-stable to mildly improved compared to the MRI 07/29/2020 Cycle 5 atezolizumab/bevacizumab 11/10/2020 Cycle 6 atezolizumab/bevacizumab 12/04/2020 Cycle 7 atezolizumab/bevacizumab 12/26/2020 Cycle 8 atezolizumab/bevacizumab 01/16/2021 Cycle 9 atezolizumab/bevacizumab 02/06/2021 Coronary artery disease Atrial fibrillation-maintained on anticoagulation History of basal cell and squamous cell skin cancers Diabetes Anorexia/weight loss Abdominal pain Urinary tract infection 08/30/2020   Disposition: Stacy [name] appears stable.  She continues to tolerate the treatment well.  There is no clinical evidence for progression of the hepatocellular carcinoma.  She will complete another treatment today.  She will return for an office visit in the next cycle of therapy in 3 weeks.  She will undergo restaging liver MRI in early February.  Stacy Coder, MD  02/06/2021  11:48 AM

## 2021-02-06 NOTE — Progress Notes (Signed)
Patient presents for treatment. RN assessment completed along with the following:  Labs/vitals reviewed - Yes, and within treatment parameters.  Urine for protein to be performed with next treatment per [facility] LPN/Dr. Benay Spice secure chat. Weight within 10% of previous measurement - Yes Oncology Treatment Attestation completed for current therapy- Yes, on date 08-12-2020 Informed consent completed and reflects current therapy/intent - Yes, on date 08/18/2020             Provider progress note reviewed - Yes, today's provider note was reviewed. Treatment/Antibody/Supportive plan reviewed - Yes, and there are no adjustments needed for today's treatment. S&H and other orders reviewed - Yes, and there are no additional orders identified. Previous treatment date reviewed - Yes, and the appropriate amount of time has elapsed between treatments.  Patient to proceed with treatment.

## 2021-02-06 NOTE — Patient Instructions (Signed)
[facility]  Discharge Instructions: Thank you for choosing [facility] to provide your oncology and hematology care.   If you have a lab appointment with the [facility], please go directly to the [facility] and check in at the registration area.   Wear comfortable clothing and clothing appropriate for easy access to any Portacath or PICC line.   We strive to give you quality time with your provider. You may need to reschedule your appointment if you arrive late (15 or more minutes).  Arriving late affects you and other patients whose appointments are after yours.  Also, if you miss three or more appointments without notifying the office, you may be dismissed from the clinic at the providers discretion.      For prescription refill requests, have your pharmacy contact our office and allow 72 hours for refills to be completed.    Today you received the following chemotherapy and/or immunotherapy agents Tecentriq/Avastin  Atezolizumab injection What is this medication? ATEZOLIZUMAB (a te zoe LIZ ue mab) is a monoclonal antibody. It is used to treat bladder cancer (urothelial cancer), liver cancer, lung cancer, and melanoma. This medicine may be used for other purposes; ask your health care provider or pharmacist if you have questions. COMMON BRAND NAME(S): Tecentriq What should I tell my care team before I take this medication? They need to know if you have any of these conditions: autoimmune diseases like Crohn's disease, ulcerative colitis, or lupus have had or planning to have an allogeneic stem cell transplant (uses someone else's stem cells) history of organ transplant history of radiation to the chest nervous system problems like myasthenia gravis or Guillain-Barre syndrome an unusual or allergic reaction to atezolizumab, other medicines, foods, dyes, or preservatives pregnant or trying to get pregnant breast-feeding How should I use  this medication? This medicine is for infusion into a vein. It is given by a health care professional in a hospital or clinic setting. A special MedGuide will be given to you before each treatment. Be sure to read this information carefully each time. Talk to your pediatrician regarding the use of this medicine in children. Special care may be needed. Overdosage: If you think you have taken too much of this medicine contact a poison control center or emergency room at once. NOTE: This medicine is only for you. Do not share this medicine with others. What if I miss a dose? It is important not to miss your dose. Call your doctor or health care professional if you are unable to keep an appointment. What may interact with this medication? Interactions have not been studied. This list may not describe all possible interactions. Give your health care provider a list of all the medicines, herbs, non-prescription drugs, or dietary supplements you use. Also tell them if you smoke, drink alcohol, or use illegal drugs. Some items may interact with your medicine. What should I watch for while using this medication? Your condition will be monitored carefully while you are receiving this medicine. You may need blood work done while you are taking this medicine. Do not become pregnant while taking this medicine or for at least 5 months after stopping it. Women should inform their doctor if they wish to become pregnant or think they might be pregnant. There is a potential for serious side effects to an unborn child. Talk to your health care professional or pharmacist for more information. Do not breast-feed an infant while taking this medicine or for  at least 5 months after the last dose. What side effects may I notice from receiving this medication? Side effects that you should report to your doctor or health care professional as soon as possible: allergic reactions like skin rash, itching or hives, swelling of  the face, lips, or tongue black, tarry stools bloody or watery diarrhea breathing problems changes in vision chest pain or chest tightness chills facial flushing fever headache signs and symptoms of high blood sugar such as dizziness; dry mouth; dry skin; fruity breath; nausea; stomach pain; increased hunger or thirst; increased urination signs and symptoms of liver injury like dark yellow or brown urine; general ill feeling or flu-like symptoms; light-colored stools; loss of appetite; nausea; right upper belly pain; unusually weak or tired; yellowing of the eyes or skin stomach pain trouble passing urine or change in the amount of urine Side effects that usually do not require medical attention (report to your doctor or health care professional if they continue or are bothersome): bone pain cough diarrhea joint pain muscle pain muscle weakness swelling of arms or legs tiredness weight loss This list may not describe all possible side effects. Call your doctor for medical advice about side effects. You may report side effects to FDA at 1-800-FDA-1088. Where should I keep my medication? This drug is given in a hospital or clinic and will not be stored at home. NOTE: This sheet is a summary. It may not cover all possible information. If you have questions about this medicine, talk to your doctor, pharmacist, or health care provider.  2022 Elsevier/Gold Standard (2020-10-17 00:00:00)  Bevacizumab injection What is this medication? BEVACIZUMAB (be va SIZ yoo mab) is a monoclonal antibody. It is used to treat many types of cancer. This medicine may be used for other purposes; ask your health care provider or pharmacist if you have questions. COMMON BRAND NAME(S): Alymsys, Avastin, MVASI, Noah Charon What should I tell my care team before I take this medication? They need to know if you have any of these conditions: diabetes heart disease high blood pressure history of coughing up  blood prior anthracycline chemotherapy (e.g., doxorubicin, daunorubicin, epirubicin) recent or ongoing radiation therapy recent or planning to have surgery stroke an unusual or allergic reaction to bevacizumab, hamster proteins, mouse proteins, other medicines, foods, dyes, or preservatives pregnant or trying to get pregnant breast-feeding How should I use this medication? This medicine is for infusion into a vein. It is given by a health care professional in a hospital or clinic setting. Talk to your pediatrician regarding the use of this medicine in children. Special care may be needed. Overdosage: If you think you have taken too much of this medicine contact a poison control center or emergency room at once. NOTE: This medicine is only for you. Do not share this medicine with others. What if I miss a dose? It is important not to miss your dose. Call your doctor or health care professional if you are unable to keep an appointment. What may interact with this medication? Interactions are not expected. This list may not describe all possible interactions. Give your health care provider a list of all the medicines, herbs, non-prescription drugs, or dietary supplements you use. Also tell them if you smoke, drink alcohol, or use illegal drugs. Some items may interact with your medicine. What should I watch for while using this medication? Your condition will be monitored carefully while you are receiving this medicine. You will need important blood work and urine testing done  while you are taking this medicine. This medicine may increase your risk to bruise or bleed. Call your doctor or health care professional if you notice any unusual bleeding. Before having surgery, talk to your health care provider to make sure it is ok. This drug can increase the risk of poor healing of your surgical site or wound. You will need to stop this drug for 28 days before surgery. After surgery, wait at least 28 days  before restarting this drug. Make sure the surgical site or wound is healed enough before restarting this drug. Talk to your health care provider if questions. Do not become pregnant while taking this medicine or for 6 months after stopping it. Women should inform their doctor if they wish to become pregnant or think they might be pregnant. There is a potential for serious side effects to an unborn child. Talk to your health care professional or pharmacist for more information. Do not breast-feed an infant while taking this medicine and for 6 months after the last dose. This medicine has caused ovarian failure in some women. This medicine may interfere with the ability to have a child. You should talk to your doctor or health care professional if you are concerned about your fertility. What side effects may I notice from receiving this medication? Side effects that you should report to your doctor or health care professional as soon as possible: allergic reactions like skin rash, itching or hives, swelling of the face, lips, or tongue chest pain or chest tightness chills coughing up blood high fever seizures severe constipation signs and symptoms of bleeding such as bloody or black, tarry stools; red or dark-brown urine; spitting up blood or brown material that looks like coffee grounds; red spots on the skin; unusual bruising or bleeding from the eye, gums, or nose signs and symptoms of a blood clot such as breathing problems; chest pain; severe, sudden headache; pain, swelling, warmth in the leg signs and symptoms of a stroke like changes in vision; confusion; trouble speaking or understanding; severe headaches; sudden numbness or weakness of the face, arm or leg; trouble walking; dizziness; loss of balance or coordination stomach pain sweating swelling of legs or ankles vomiting weight gain Side effects that usually do not require medical attention (report to your doctor or health care  professional if they continue or are bothersome): back pain changes in taste decreased appetite dry skin nausea tiredness This list may not describe all possible side effects. Call your doctor for medical advice about side effects. You may report side effects to FDA at 1-800-FDA-1088. Where should I keep my medication? This drug is given in a hospital or clinic and will not be stored at home. NOTE: This sheet is a summary. It may not cover all possible information. If you have questions about this medicine, talk to your doctor, pharmacist, or health care provider.  2022 Elsevier/Gold Standard (2020-10-17 00:00:00)        To help prevent nausea and vomiting after your treatment, we encourage you to take your nausea medication as directed.  BELOW ARE SYMPTOMS THAT SHOULD BE REPORTED IMMEDIATELY: *FEVER GREATER THAN 100.4 F (38 C) OR HIGHER *CHILLS OR SWEATING *NAUSEA AND VOMITING THAT IS NOT CONTROLLED WITH YOUR NAUSEA MEDICATION *UNUSUAL SHORTNESS OF BREATH *UNUSUAL BRUISING OR BLEEDING *URINARY PROBLEMS (pain or burning when urinating, or frequent urination) *BOWEL PROBLEMS (unusual diarrhea, constipation, pain near the anus) TENDERNESS IN MOUTH AND THROAT WITH OR WITHOUT PRESENCE OF ULCERS (sore throat, sores in mouth,  or a toothache) UNUSUAL RASH, SWELLING OR PAIN  UNUSUAL VAGINAL DISCHARGE OR ITCHING   Items with * indicate a potential emergency and should be followed up as soon as possible or go to the Emergency Department if any problems should occur.  Please show the CHEMOTHERAPY ALERT CARD or IMMUNOTHERAPY ALERT CARD at check-in to the Emergency Department and triage nurse.  Should you have questions after your visit or need to cancel or reschedule your appointment, please contact [facility]  Dept: [phone]  and follow the prompts.  Office hours are 8:00 a.m. to 4:30 p.m. Monday - Friday. Please note that voicemails left after 4:00 p.m. may  not be returned until the following business day.  We are closed weekends and major holidays. You have access to a nurse at all times for urgent questions. Please call the main number to the clinic Dept: [phone] and follow the prompts.   For any non-urgent questions, you may also contact your provider using MyChart. We now offer e-Visits for anyone 69 and older to request care online for non-urgent symptoms. For details visit [email or web address].   Also download the MyChart app! Go to the app store, search "MyChart", open the app, select [facility], and log in with your MyChart username and password.  Due to Covid, a mask is required upon entering the hospital/clinic. If you do not have a mask, one will be given to you upon arrival. For doctor visits, patients may have 1 support person aged 36 or older with them. For treatment visits, patients cannot have anyone with them due to current Covid guidelines and our immunocompromised population.

## 2021-02-06 NOTE — Telephone Encounter (Signed)
-----   Message from Ladell Pier, MD sent at 02/06/2021  2:14 PM EST ----- Add ferritin to lab from 02/06/2021 or lab at next visit

## 2021-02-06 NOTE — Telephone Encounter (Signed)
Patient seen by Dr. Sherrill today ? ?Vitals are within treatment parameters. ? ?Labs reviewed by Dr. Sherrill and are within treatment parameters. ? ?Per physician team, patient is ready for treatment and there are NO modifications to the treatment plan.  ?

## 2021-02-06 NOTE — Telephone Encounter (Signed)
Orders placed for ferritin on 02/26/21

## 2021-02-25 ENCOUNTER — Other Ambulatory Visit: Payer: Self-pay | Admitting: Oncology

## 2021-02-26 ENCOUNTER — Inpatient Hospital Stay: Payer: Medicare Other

## 2021-02-26 ENCOUNTER — Inpatient Hospital Stay: Payer: Medicare Other | Attending: Nurse Practitioner

## 2021-02-26 ENCOUNTER — Inpatient Hospital Stay (HOSPITAL_BASED_OUTPATIENT_CLINIC_OR_DEPARTMENT_OTHER): Payer: Medicare Other | Admitting: Oncology

## 2021-02-26 ENCOUNTER — Other Ambulatory Visit: Payer: Self-pay

## 2021-02-26 ENCOUNTER — Encounter: Payer: Self-pay | Admitting: *Deleted

## 2021-02-26 VITALS — BP 113/64 | HR 60 | Temp 98.0°F | Resp 20

## 2021-02-26 VITALS — BP 111/67 | HR 69 | Temp 97.8°F | Resp 18 | Ht 62.0 in | Wt 123.8 lb

## 2021-02-26 DIAGNOSIS — Z5112 Encounter for antineoplastic immunotherapy: Secondary | ICD-10-CM | POA: Insufficient documentation

## 2021-02-26 DIAGNOSIS — Z85828 Personal history of other malignant neoplasm of skin: Secondary | ICD-10-CM | POA: Diagnosis not present

## 2021-02-26 DIAGNOSIS — E119 Type 2 diabetes mellitus without complications: Secondary | ICD-10-CM | POA: Insufficient documentation

## 2021-02-26 DIAGNOSIS — Z87440 Personal history of urinary (tract) infections: Secondary | ICD-10-CM | POA: Insufficient documentation

## 2021-02-26 DIAGNOSIS — C220 Liver cell carcinoma: Secondary | ICD-10-CM | POA: Insufficient documentation

## 2021-02-26 DIAGNOSIS — I4891 Unspecified atrial fibrillation: Secondary | ICD-10-CM | POA: Insufficient documentation

## 2021-02-26 DIAGNOSIS — Z5111 Encounter for antineoplastic chemotherapy: Secondary | ICD-10-CM | POA: Diagnosis not present

## 2021-02-26 DIAGNOSIS — I2510 Atherosclerotic heart disease of native coronary artery without angina pectoris: Secondary | ICD-10-CM | POA: Diagnosis not present

## 2021-02-26 DIAGNOSIS — R197 Diarrhea, unspecified: Secondary | ICD-10-CM | POA: Insufficient documentation

## 2021-02-26 LAB — CMP (CANCER CENTER ONLY)
ALT: 168 U/L — ABNORMAL HIGH (ref 0–44)
AST: 228 U/L (ref 15–41)
Albumin: 3.5 g/dL (ref 3.5–5.0)
Alkaline Phosphatase: 187 U/L — ABNORMAL HIGH (ref 38–126)
Anion gap: 12 (ref 5–15)
BUN: 17 mg/dL (ref 8–23)
CO2: 24 mmol/L (ref 22–32)
Calcium: 9.5 mg/dL (ref 8.9–10.3)
Chloride: 103 mmol/L (ref 98–111)
Creatinine: 1.13 mg/dL — ABNORMAL HIGH (ref 0.44–1.00)
GFR, Estimated: 46 mL/min — ABNORMAL LOW (ref >60)
Glucose, Bld: 225 mg/dL — ABNORMAL HIGH (ref 70–99)
Potassium: 3.9 mmol/L (ref 3.5–5.1)
Sodium: 139 mmol/L (ref 135–145)
Total Bilirubin: 0.7 mg/dL (ref 0.3–1.2)
Total Protein: 6.8 g/dL (ref 6.5–8.1)

## 2021-02-26 LAB — CBC WITH DIFFERENTIAL (CANCER CENTER ONLY)
Abs Immature Granulocytes: 0.01 10*3/uL (ref 0.00–0.07)
Basophils Absolute: 0.1 10*3/uL (ref 0.0–0.1)
Basophils Relative: 1 %
Eosinophils Absolute: 0.4 10*3/uL (ref 0.0–0.5)
Eosinophils Relative: 6 %
HCT: 38.8 % (ref 36.0–46.0)
Hemoglobin: 11.5 g/dL — ABNORMAL LOW (ref 12.0–15.0)
Immature Granulocytes: 0 %
Lymphocytes Relative: 39 %
Lymphs Abs: 2.8 10*3/uL (ref 0.7–4.0)
MCH: 23 pg — ABNORMAL LOW (ref 26.0–34.0)
MCHC: 29.6 g/dL — ABNORMAL LOW (ref 30.0–36.0)
MCV: 77.6 fL — ABNORMAL LOW (ref 80.0–100.0)
Monocytes Absolute: 0.6 10*3/uL (ref 0.1–1.0)
Monocytes Relative: 8 %
Neutro Abs: 3.3 10*3/uL (ref 1.7–7.7)
Neutrophils Relative %: 46 %
Platelet Count: 166 10*3/uL (ref 150–400)
RBC: 5 MIL/uL (ref 3.87–5.11)
RDW: 17.9 % — ABNORMAL HIGH (ref 11.5–15.5)
WBC Count: 7.1 10*3/uL (ref 4.0–10.5)
nRBC: 0 % (ref 0.0–0.2)

## 2021-02-26 LAB — FERRITIN: Ferritin: 23 ng/mL (ref 11–307)

## 2021-02-26 LAB — TOTAL PROTEIN, URINE DIPSTICK

## 2021-02-26 MED ORDER — SODIUM CHLORIDE 0.9 % IV SOLN
1200.0000 mg | Freq: Once | INTRAVENOUS | Status: AC
  Administered 2021-02-26: 1200 mg via INTRAVENOUS
  Filled 2021-02-26: qty 20

## 2021-02-26 MED ORDER — SODIUM CHLORIDE 0.9 % IV SOLN: Freq: Once | INTRAVENOUS | Status: AC

## 2021-02-26 MED ORDER — SODIUM CHLORIDE 0.9 % IV SOLN
15.0000 mg/kg | Freq: Once | INTRAVENOUS | Status: AC
  Administered 2021-02-26: 900 mg via INTRAVENOUS
  Filled 2021-02-26: qty 4

## 2021-02-26 NOTE — Progress Notes (Signed)
Patient seen by Dr. Benay Spice today  Vitals are within treatment parameters.  Labs reviewed by Dr. Benay Spice and are not all within treatment parameters. OK to treat w/trace urine protein and AST 228  Per physician team, patient is ready for treatment and there are NO modifications to the treatment plan.   CRITICAL VALUE STICKER  CRITICAL VALUE: AST 228  RECEIVER (on-site recipient of call):Olegario Emberson,RN  DATE & TIME NOTIFIED: 02/26/21 @ 1136  MESSENGER (representative from lab):Janee  MD NOTIFIED: Dr. Benay Spice  TIME OF NOTIFICATION: 1140  RESPONSE: NO change in orders.

## 2021-02-26 NOTE — Progress Notes (Signed)
°  [facility] OFFICE PROGRESS NOTE   Diagnosis: Hepatocellular carcinoma  INTERVAL HISTORY:   Stacy [name] returns as scheduled.  She completed another treatment with atezolizumab/bevacizumab on 02/06/2021.  No rash.  Occasional mild diarrhea relieved with Imodium.  Good appetite.  No pain.  Objective:  Vital signs in last 24 hours:  Blood pressure 111/67, pulse 69, temperature 97.8 F (36.6 C), temperature source Oral, resp. rate 18, height 5\' 2"  (1.575 m), weight 123 lb 12.8 oz (56.2 kg), SpO2 98 %.    Resp: Lungs clear bilaterally Cardio: Regular rate and rhythm GI: No hepatomegaly, nontender Vascular: No leg edema  Skin: No rash  Portacath/PICC-without erythema  Lab Results:  Lab Results  Component Value Date   WBC 7.2 02/06/2021   HGB 11.7 (L) 02/06/2021   HCT 40.0 02/06/2021   MCV 78.9 (L) 02/06/2021   PLT 239 02/06/2021   NEUTROABS 3.0 02/06/2021    CMP  Lab Results  Component Value Date   NA 140 02/06/2021   K 4.0 02/06/2021   CL 103 02/06/2021   CO2 28 02/06/2021   GLUCOSE 213 (H) 02/06/2021   BUN 16 02/06/2021   CREATININE 1.22 (H) 02/06/2021   CALCIUM 9.4 02/06/2021   PROT 6.8 02/06/2021   ALBUMIN 3.5 02/06/2021   AST 159 (H) 02/06/2021   ALT 84 (H) 02/06/2021   ALKPHOS 167 (H) 02/06/2021   BILITOT 0.7 02/06/2021   GFRNONAA 42 (L) 02/06/2021   GFRAA 54 (L) 02/22/2020    Lab Results  Component Value Date   CEA 2.45 08/02/2020   MGQ676 27 08/02/2020     Medications: I have reviewed the patient's current medications.   Assessment/Plan: Multiple liver masses CT abdomen/pelvis 07/11/2018, low-attenuation liver lesions incompletely characterized without contrast Ultrasound abdomen 07/10/2020-innumerable incompletely assessed hypoechoic masses in the liver MRI abdomen 07/29/2020- innumerable hypervascular liver masses, nodular liver contour, 1.5 cm left adrenal mass Biopsy liver lesion 08/08/2020-hepatocellular carcinoma, moderately  differentiated Cycle 1 atezolizumab/bevacizumab 08/18/2020 Cycle 2 atezolizumab/bevacizumab 09/07/2020 Cycle 3 atezolizumab/bevacizumab 09/28/2020 Cycle 4 atezolizumab/bevacizumab 10/19/2020 MRI liver 11/07/2020-numerous hyperenhancing liver masses-stable to mildly improved compared to the MRI 07/29/2020 Cycle 5 atezolizumab/bevacizumab 11/10/2020 Cycle 6 atezolizumab/bevacizumab 12/04/2020 Cycle 7 atezolizumab/bevacizumab 12/26/2020 Cycle 8 atezolizumab/bevacizumab 01/16/2021 Cycle 9 atezolizumab/bevacizumab 02/06/2021 Coronary artery disease Atrial fibrillation-maintained on anticoagulation History of basal cell and squamous cell skin cancers Diabetes Anorexia/weight loss Abdominal pain Urinary tract infection 08/30/2020     Disposition: Stacy [name] appears stable.  She will complete another cycle of atezolizumab/bevacizumab today.  She will return for an office visit in the next cycle of treatment on 03/22/2021.  She will undergo a restaging MRI prior to the next office visit.  [name], MD  02/26/2021  11:29 AM

## 2021-02-26 NOTE — Progress Notes (Signed)
Patient presents for treatment. RN assessment completed along with the following:  Labs/vitals reviewed - Yes, and Please see collab nurse Susan-C, RN's note    Weight within 10% of previous measurement - Yes Informed consent completed and reflects current therapy/intent - Yes, on date 08/18/2020             Provider progress note reviewed - Yes, today's provider note was reviewed. Treatment/Antibody/Supportive plan reviewed - Yes, and there are no adjustments needed for today's treatment. S&H and other orders reviewed - Yes, and there are no additional orders identified. Previous treatment date reviewed - Yes, and the appropriate amount of time has elapsed between treatments. Clinic Hand Off Received from - Yes from [facility], RN  Patient to proceed with treatment.

## 2021-02-26 NOTE — Patient Instructions (Signed)
[facility]   Discharge Instructions: Thank you for choosing [facility] to provide your oncology and hematology care.   If you have a lab appointment with the [facility], please go directly to the [facility] and check in at the registration area.   Wear comfortable clothing and clothing appropriate for easy access to any Portacath or PICC line.   We strive to give you quality time with your provider. You may need to reschedule your appointment if you arrive late (15 or more minutes).  Arriving late affects you and other patients whose appointments are after yours.  Also, if you miss three or more appointments without notifying the office, you may be dismissed from the clinic at the providers discretion.      For prescription refill requests, have your pharmacy contact our office and allow 72 hours for refills to be completed.    Today you received the following chemotherapy and/or immunotherapy agents Atezolizumab (TECENTRIQ) & Bevacizumab-awwb (MVASI).      To help prevent nausea and vomiting after your treatment, we encourage you to take your nausea medication as directed.  BELOW ARE SYMPTOMS THAT SHOULD BE REPORTED IMMEDIATELY: *FEVER GREATER THAN 100.4 F (38 C) OR HIGHER *CHILLS OR SWEATING *NAUSEA AND VOMITING THAT IS NOT CONTROLLED WITH YOUR NAUSEA MEDICATION *UNUSUAL SHORTNESS OF BREATH *UNUSUAL BRUISING OR BLEEDING *URINARY PROBLEMS (pain or burning when urinating, or frequent urination) *BOWEL PROBLEMS (unusual diarrhea, constipation, pain near the anus) TENDERNESS IN MOUTH AND THROAT WITH OR WITHOUT PRESENCE OF ULCERS (sore throat, sores in mouth, or a toothache) UNUSUAL RASH, SWELLING OR PAIN  UNUSUAL VAGINAL DISCHARGE OR ITCHING   Items with * indicate a potential emergency and should be followed up as soon as possible or go to the Emergency Department if any problems should occur.  Please show the CHEMOTHERAPY ALERT CARD or  IMMUNOTHERAPY ALERT CARD at check-in to the Emergency Department and triage nurse.  Should you have questions after your visit or need to cancel or reschedule your appointment, please contact [facility]  Dept: [phone]  and follow the prompts.  Office hours are 8:00 a.m. to 4:30 p.m. Monday - Friday. Please note that voicemails left after 4:00 p.m. may not be returned until the following business day.  We are closed weekends and major holidays. You have access to a nurse at all times for urgent questions. Please call the main number to the clinic Dept: [phone] and follow the prompts.   For any non-urgent questions, you may also contact your provider using MyChart. We now offer e-Visits for anyone 31 and older to request care online for non-urgent symptoms. For details visit [email or web address].   Also download the MyChart app! Go to the app store, search "MyChart", open the app, select [facility], and log in with your MyChart username and password.  Due to Covid, a mask is required upon entering the hospital/clinic. If you do not have a mask, one will be given to you upon arrival. For doctor visits, patients may have 1 support person aged 27 or older with them. For treatment visits, patients cannot have anyone with them due to current Covid guidelines and our immunocompromised population.   Atezolizumab injection What is this medication? ATEZOLIZUMAB (a te zoe LIZ ue mab) is a monoclonal antibody. It is used to treat bladder cancer (urothelial cancer), liver cancer, lung cancer, and melanoma. This medicine may be used for other purposes; ask your health care  provider or pharmacist if you have questions. COMMON BRAND NAME(S): Tecentriq What should I tell my care team before I take this medication? They need to know if you have any of these conditions: autoimmune diseases like Crohn's disease, ulcerative colitis, or lupus have had or planning to have an  allogeneic stem cell transplant (uses someone else's stem cells) history of organ transplant history of radiation to the chest nervous system problems like myasthenia gravis or Guillain-Barre syndrome an unusual or allergic reaction to atezolizumab, other medicines, foods, dyes, or preservatives pregnant or trying to get pregnant breast-feeding How should I use this medication? This medicine is for infusion into a vein. It is given by a health care professional in a hospital or clinic setting. A special MedGuide will be given to you before each treatment. Be sure to read this information carefully each time. Talk to your pediatrician regarding the use of this medicine in children. Special care may be needed. Overdosage: If you think you have taken too much of this medicine contact a poison control center or emergency room at once. NOTE: This medicine is only for you. Do not share this medicine with others. What if I miss a dose? It is important not to miss your dose. Call your doctor or health care professional if you are unable to keep an appointment. What may interact with this medication? Interactions have not been studied. This list may not describe all possible interactions. Give your health care provider a list of all the medicines, herbs, non-prescription drugs, or dietary supplements you use. Also tell them if you smoke, drink alcohol, or use illegal drugs. Some items may interact with your medicine. What should I watch for while using this medication? Your condition will be monitored carefully while you are receiving this medicine. You may need blood work done while you are taking this medicine. Do not become pregnant while taking this medicine or for at least 5 months after stopping it. Women should inform their doctor if they wish to become pregnant or think they might be pregnant. There is a potential for serious side effects to an unborn child. Talk to your health care professional  or pharmacist for more information. Do not breast-feed an infant while taking this medicine or for at least 5 months after the last dose. What side effects may I notice from receiving this medication? Side effects that you should report to your doctor or health care professional as soon as possible: allergic reactions like skin rash, itching or hives, swelling of the face, lips, or tongue black, tarry stools bloody or watery diarrhea breathing problems changes in vision chest pain or chest tightness chills facial flushing fever headache signs and symptoms of high blood sugar such as dizziness; dry mouth; dry skin; fruity breath; nausea; stomach pain; increased hunger or thirst; increased urination signs and symptoms of liver injury like dark yellow or brown urine; general ill feeling or flu-like symptoms; light-colored stools; loss of appetite; nausea; right upper belly pain; unusually weak or tired; yellowing of the eyes or skin stomach pain trouble passing urine or change in the amount of urine Side effects that usually do not require medical attention (report to your doctor or health care professional if they continue or are bothersome): bone pain cough diarrhea joint pain muscle pain muscle weakness swelling of arms or legs tiredness weight loss This list may not describe all possible side effects. Call your doctor for medical advice about side effects. You may report side effects to  FDA at 1-800-FDA-1088. Where should I keep my medication? This drug is given in a hospital or clinic and will not be stored at home. NOTE: This sheet is a summary. It may not cover all possible information. If you have questions about this medicine, talk to your doctor, pharmacist, or health care provider.  2022 Elsevier/Gold Standard (2020-10-17 00:00:00)  Bevacizumab injection What is this medication? BEVACIZUMAB (be va SIZ yoo mab) is a monoclonal antibody. It is used to treat many types of  cancer. This medicine may be used for other purposes; ask your health care provider or pharmacist if you have questions. COMMON BRAND NAME(S): Alymsys, Avastin, MVASI, Noah Charon What should I tell my care team before I take this medication? They need to know if you have any of these conditions: diabetes heart disease high blood pressure history of coughing up blood prior anthracycline chemotherapy (e.g., doxorubicin, daunorubicin, epirubicin) recent or ongoing radiation therapy recent or planning to have surgery stroke an unusual or allergic reaction to bevacizumab, hamster proteins, mouse proteins, other medicines, foods, dyes, or preservatives pregnant or trying to get pregnant breast-feeding How should I use this medication? This medicine is for infusion into a vein. It is given by a health care professional in a hospital or clinic setting. Talk to your pediatrician regarding the use of this medicine in children. Special care may be needed. Overdosage: If you think you have taken too much of this medicine contact a poison control center or emergency room at once. NOTE: This medicine is only for you. Do not share this medicine with others. What if I miss a dose? It is important not to miss your dose. Call your doctor or health care professional if you are unable to keep an appointment. What may interact with this medication? Interactions are not expected. This list may not describe all possible interactions. Give your health care provider a list of all the medicines, herbs, non-prescription drugs, or dietary supplements you use. Also tell them if you smoke, drink alcohol, or use illegal drugs. Some items may interact with your medicine. What should I watch for while using this medication? Your condition will be monitored carefully while you are receiving this medicine. You will need important blood work and urine testing done while you are taking this medicine. This medicine may increase  your risk to bruise or bleed. Call your doctor or health care professional if you notice any unusual bleeding. Before having surgery, talk to your health care provider to make sure it is ok. This drug can increase the risk of poor healing of your surgical site or wound. You will need to stop this drug for 28 days before surgery. After surgery, wait at least 28 days before restarting this drug. Make sure the surgical site or wound is healed enough before restarting this drug. Talk to your health care provider if questions. Do not become pregnant while taking this medicine or for 6 months after stopping it. Women should inform their doctor if they wish to become pregnant or think they might be pregnant. There is a potential for serious side effects to an unborn child. Talk to your health care professional or pharmacist for more information. Do not breast-feed an infant while taking this medicine and for 6 months after the last dose. This medicine has caused ovarian failure in some women. This medicine may interfere with the ability to have a child. You should talk to your doctor or health care professional if you are concerned about your  fertility. What side effects may I notice from receiving this medication? Side effects that you should report to your doctor or health care professional as soon as possible: allergic reactions like skin rash, itching or hives, swelling of the face, lips, or tongue chest pain or chest tightness chills coughing up blood high fever seizures severe constipation signs and symptoms of bleeding such as bloody or black, tarry stools; red or dark-brown urine; spitting up blood or brown material that looks like coffee grounds; red spots on the skin; unusual bruising or bleeding from the eye, gums, or nose signs and symptoms of a blood clot such as breathing problems; chest pain; severe, sudden headache; pain, swelling, warmth in the leg signs and symptoms of a stroke like changes  in vision; confusion; trouble speaking or understanding; severe headaches; sudden numbness or weakness of the face, arm or leg; trouble walking; dizziness; loss of balance or coordination stomach pain sweating swelling of legs or ankles vomiting weight gain Side effects that usually do not require medical attention (report to your doctor or health care professional if they continue or are bothersome): back pain changes in taste decreased appetite dry skin nausea tiredness This list may not describe all possible side effects. Call your doctor for medical advice about side effects. You may report side effects to FDA at 1-800-FDA-1088. Where should I keep my medication? This drug is given in a hospital or clinic and will not be stored at home. NOTE: This sheet is a summary. It may not cover all possible information. If you have questions about this medicine, talk to your doctor, pharmacist, or health care provider.  2022 Elsevier/Gold Standard (2020-10-17 00:00:00)

## 2021-02-27 ENCOUNTER — Other Ambulatory Visit: Payer: Medicare Other

## 2021-02-28 ENCOUNTER — Telehealth: Payer: Self-pay | Admitting: *Deleted

## 2021-02-28 NOTE — Telephone Encounter (Signed)
Left VM for daughter with lab at [facility] 2/7 at [place] and MRI at [address] at 11:20 am with arrival at 1050 am. NPO 4 hours prior. Requested return call or Mychart message to confirm.

## 2021-03-01 ENCOUNTER — Ambulatory Visit: Payer: Medicare Other | Admitting: Legal Medicine

## 2021-03-06 ENCOUNTER — Other Ambulatory Visit: Payer: Medicare Other

## 2021-03-06 ENCOUNTER — Other Ambulatory Visit: Payer: Self-pay

## 2021-03-06 DIAGNOSIS — E1169 Type 2 diabetes mellitus with other specified complication: Secondary | ICD-10-CM | POA: Diagnosis not present

## 2021-03-06 DIAGNOSIS — E1159 Type 2 diabetes mellitus with other circulatory complications: Secondary | ICD-10-CM | POA: Diagnosis not present

## 2021-03-06 DIAGNOSIS — I152 Hypertension secondary to endocrine disorders: Secondary | ICD-10-CM | POA: Diagnosis not present

## 2021-03-06 DIAGNOSIS — E1151 Type 2 diabetes mellitus with diabetic peripheral angiopathy without gangrene: Secondary | ICD-10-CM

## 2021-03-06 DIAGNOSIS — E782 Mixed hyperlipidemia: Secondary | ICD-10-CM | POA: Diagnosis not present

## 2021-03-06 DIAGNOSIS — E669 Obesity, unspecified: Secondary | ICD-10-CM | POA: Diagnosis not present

## 2021-03-07 ENCOUNTER — Encounter: Payer: Self-pay | Admitting: Oncology

## 2021-03-07 LAB — COMPREHENSIVE METABOLIC PANEL
ALT: 228 IU/L (ref 0–32)
AST: 286 IU/L (ref 0–40)
Albumin/Globulin Ratio: 1.3 (ref 1.2–2.2)
Albumin: 3.5 g/dL (ref 3.5–4.6)
Alkaline Phosphatase: 231 IU/L — ABNORMAL HIGH (ref 44–121)
BUN/Creatinine Ratio: 12 (ref 12–28)
BUN: 14 mg/dL (ref 10–36)
Bilirubin Total: 0.7 mg/dL (ref 0.0–1.2)
CO2: 26 mmol/L (ref 20–29)
Calcium: 9.2 mg/dL (ref 8.7–10.3)
Chloride: 100 mmol/L (ref 96–106)
Creatinine, Ser: 1.2 mg/dL — ABNORMAL HIGH (ref 0.57–1.00)
Globulin, Total: 2.6 g/dL (ref 1.5–4.5)
Glucose: 100 mg/dL — ABNORMAL HIGH (ref 70–99)
Potassium: 4.8 mmol/L (ref 3.5–5.2)
Sodium: 140 mmol/L (ref 134–144)
Total Protein: 6.1 g/dL (ref 6.0–8.5)
eGFR: 42 mL/min/{1.73_m2} — ABNORMAL LOW (ref >59)

## 2021-03-07 LAB — HEMOGLOBIN A1C
Est. average glucose Bld gHb Est-mCnc: 157 mg/dL
Hgb A1c MFr Bld: 7.1 % — ABNORMAL HIGH (ref 4.8–5.6)

## 2021-03-07 LAB — CBC WITH DIFFERENTIAL/PLATELET
Basophils Absolute: 0.1 10*3/uL (ref 0.0–0.2)
Basos: 1 %
EOS (ABSOLUTE): 0.5 10*3/uL — ABNORMAL HIGH (ref 0.0–0.4)
Eos: 5 %
Hematocrit: 37.8 % (ref 34.0–46.6)
Hemoglobin: 11.5 g/dL (ref 11.1–15.9)
Immature Grans (Abs): 0 10*3/uL (ref 0.0–0.1)
Immature Granulocytes: 0 %
Lymphocytes Absolute: 4.8 10*3/uL — ABNORMAL HIGH (ref 0.7–3.1)
Lymphs: 45 %
MCH: 23.5 pg — ABNORMAL LOW (ref 26.6–33.0)
MCHC: 30.4 g/dL — ABNORMAL LOW (ref 31.5–35.7)
MCV: 77 fL — ABNORMAL LOW (ref 79–97)
Monocytes Absolute: 1 10*3/uL — ABNORMAL HIGH (ref 0.1–0.9)
Monocytes: 9 %
Neutrophils Absolute: 4.3 10*3/uL (ref 1.4–7.0)
Neutrophils: 40 %
Platelets: 233 10*3/uL (ref 150–450)
RBC: 4.9 x10E6/uL (ref 3.77–5.28)
RDW: 16.9 % — ABNORMAL HIGH (ref 11.7–15.4)
WBC: 10.7 10*3/uL (ref 3.4–10.8)

## 2021-03-07 LAB — LIPID PANEL
Chol/HDL Ratio: 3.6 ratio (ref 0.0–4.4)
Cholesterol, Total: 123 mg/dL (ref 100–199)
HDL: 34 mg/dL — ABNORMAL LOW (ref >39)
LDL Chol Calc (NIH): 65 mg/dL (ref 0–99)
Triglycerides: 132 mg/dL (ref 0–149)
VLDL Cholesterol Cal: 24 mg/dL (ref 5–40)

## 2021-03-07 LAB — CARDIOVASCULAR RISK ASSESSMENT

## 2021-03-07 NOTE — Progress Notes (Signed)
Glucose 100, kidney stage 3b, liver tests high ( has liver cancer), cbc ok, cholesterol OK, A1c 7.1 ok lp

## 2021-03-08 ENCOUNTER — Ambulatory Visit (INDEPENDENT_AMBULATORY_CARE_PROVIDER_SITE_OTHER): Payer: Medicare Other | Admitting: Legal Medicine

## 2021-03-08 ENCOUNTER — Other Ambulatory Visit: Payer: Self-pay

## 2021-03-08 ENCOUNTER — Encounter: Payer: Self-pay | Admitting: Legal Medicine

## 2021-03-08 VITALS — BP 120/70 | HR 77 | Temp 97.7°F | Resp 115 | Ht 62.0 in | Wt 126.0 lb

## 2021-03-08 DIAGNOSIS — E1159 Type 2 diabetes mellitus with other circulatory complications: Secondary | ICD-10-CM

## 2021-03-08 DIAGNOSIS — C220 Liver cell carcinoma: Secondary | ICD-10-CM

## 2021-03-08 DIAGNOSIS — I2510 Atherosclerotic heart disease of native coronary artery without angina pectoris: Secondary | ICD-10-CM | POA: Diagnosis not present

## 2021-03-08 DIAGNOSIS — H906 Mixed conductive and sensorineural hearing loss, bilateral: Secondary | ICD-10-CM | POA: Diagnosis not present

## 2021-03-08 DIAGNOSIS — E1169 Type 2 diabetes mellitus with other specified complication: Secondary | ICD-10-CM

## 2021-03-08 DIAGNOSIS — I69354 Hemiplegia and hemiparesis following cerebral infarction affecting left non-dominant side: Secondary | ICD-10-CM

## 2021-03-08 DIAGNOSIS — E1151 Type 2 diabetes mellitus with diabetic peripheral angiopathy without gangrene: Secondary | ICD-10-CM | POA: Diagnosis not present

## 2021-03-08 DIAGNOSIS — I10 Essential (primary) hypertension: Secondary | ICD-10-CM

## 2021-03-08 DIAGNOSIS — F321 Major depressive disorder, single episode, moderate: Secondary | ICD-10-CM | POA: Diagnosis not present

## 2021-03-08 DIAGNOSIS — D6869 Other thrombophilia: Secondary | ICD-10-CM | POA: Diagnosis not present

## 2021-03-08 DIAGNOSIS — E669 Obesity, unspecified: Secondary | ICD-10-CM | POA: Diagnosis not present

## 2021-03-08 DIAGNOSIS — I480 Paroxysmal atrial fibrillation: Secondary | ICD-10-CM

## 2021-03-08 DIAGNOSIS — E1121 Type 2 diabetes mellitus with diabetic nephropathy: Secondary | ICD-10-CM

## 2021-03-08 DIAGNOSIS — I152 Hypertension secondary to endocrine disorders: Secondary | ICD-10-CM

## 2021-03-08 NOTE — Progress Notes (Signed)
Subjective:  Patient ID: Stacy [name], female    DOB: [birth year]  Age: 88 y.o. MRN: [id number]  Chief Complaint  Patient presents with   Diabetes   Hyperlipidemia   Atrial Fibrillation    HPI Diabetes: Patient is not taking any medication for diabetes. She is eating healthy. She checks blood sugar 2 times per week and it has been normal. Last A1C 7.1 %.  Hypertension:  She takes metoprolol 25 mg daily. Patient presents for follow up of hypertension.  Patient tolerating metoprolol well with side effects.  Patient was diagnosed with hypertension 2010 so has been treated for hypertension for 12 years.Patient is working on maintaining diet and exercise regimen and follows up as directed. Complication include none.   Hyperlipidemia: She is taking rosuvastatin 10 mg daily  Patient presents with hyperlipidemia.  Compliance with treatment has been good; patient takes medicines as directed, maintains low cholesterol diet, follows up as directed, and maintains exercise regimen.  Patient is using crestor without problems.    Depression: Sertraline 50 mg daily. She is stable and doing well  Afib: Eliquis 2.5 mg daily. Atria fibrillation is stable Current Outpatient Medications on File Prior to Visit  Medication Sig Dispense Refill   ACCU-CHEK SMARTVIEW test strip 1 each by Other route as needed for other (blood sugar test strips).   11   apixaban (ELIQUIS) 2.5 MG TABS tablet Take 1 tablet (2.5 mg total) by mouth 2 (two) times daily. 60 tablet 11   cetirizine (ZYRTEC) 10 MG tablet TAKE 1 TABLET BY MOUTH EVERY DAY 90 tablet 2   diphenhydrAMINE (BENADRYL) 25 MG tablet Take 25 mg by mouth every 6 (six) hours as needed.     Famotidine-Ca Carb-Mag Hydrox (PEPCID COMPLETE PO) Take by mouth.     linaclotide (LINZESS) 72 MCG capsule Take 72 mcg by mouth daily. As needed     metoprolol succinate (TOPROL XL) 25 MG 24 hr tablet Take 1 tablet (25 mg total) by mouth in the morning and at bedtime. 180 tablet 3    mometasone (ELOCON) 0.1 % cream mometasone 0.1 % topical cream  APPLY TO AFFECTED AREA EVERY DAY AS NEEDED ITCHING     NEOMYCIN-POLYMYXIN-HYDROCORTISONE (CORTISPORIN) 1 % SOLN otic solution Place 2 drops into both ears as directed.  3   nitroGLYCERIN (NITROSTAT) 0.4 MG SL tablet Place 1 tablet (0.4 mg total) under the tongue every 5 (five) minutes as needed for chest pain. 25 tablet 3   ondansetron (ZOFRAN ODT) 4 MG disintegrating tablet Take 1 tablet (4 mg total) by mouth every 8 (eight) hours as needed for nausea or vomiting. 30 tablet 3   rosuvastatin (CRESTOR) 10 MG tablet TAKE 1 TABLET BY MOUTH EVERY DAY 90 tablet 3   sertraline (ZOLOFT) 50 MG tablet TAKE 1 TABLET BY MOUTH EVERY DAY 90 tablet 2   traMADol (ULTRAM) 50 MG tablet Take 0.5-1 tablets (25-50 mg total) by mouth every 12 (twelve) hours as needed. 30 tablet 0   No current facility-administered medications on file prior to visit.   Past Medical History:  Diagnosis Date   Anxiety    Hyperlipidemia    Past Surgical History:  Procedure Laterality Date   3 epidural steroid injections     APPENDECTOMY     Basal Cell Cancer Removal     forehead   CATARACT EXTRACTION     CATARACT EXTRACTION  12/29/2003   CORONARY ANGIOPLASTY  03-15-1995   CORONARY ANGIOPLASTY WITH STENT PLACEMENT  05-19-1998  CORONARY STENT PLACEMENT  2000   DILATION AND CURETTAGE OF UTERUS     ENDARTERECTOMY Right 04/13/2015   Procedure: ENDARTERECTOMY CAROTID;  Surgeon: Serafina Mitchell, MD;  Location: [place];  Service: Vascular;  Laterality: Right;   ENDOMETRIAL BIOPSY     EYE SURGERY     heart catherization     HEMORRHOID SURGERY     INCISIONAL HERNIA REPAIR     INGUINAL HERNIA REPAIR     LOOP RECORDER INSERTION N/A 06/24/2016   Procedure: Loop Recorder Insertion;  Surgeon: Deboraha Sprang, MD;  Location: [place] [facility];  Service: Cardiovascular;  Laterality: N/A;   PATCH ANGIOPLASTY Right 04/13/2015   Procedure: PATCH ANGIOPLASTY;  Surgeon: Serafina Mitchell, MD;  Location: [facility] OR;  Service: Vascular;  Laterality: Right;   PTCA     SQUAMOUS CELL CARCINOMA EXCISION     left lower leg   TEE WITHOUT CARDIOVERSION N/A 06/24/2016   Procedure: TRANSESOPHAGEAL ECHOCARDIOGRAM (TEE);  Surgeon: Dorothy Spark, MD;  Location: [facility] ENDOSCOPY;  Service: Cardiovascular;  Laterality: N/A;   TONSILLECTOMY      Family History  Problem Relation Age of Onset   Diabetes Father    Prostate cancer Father        meta to colon   Cancer Father        Prostate   Parkinsonism Mother    Stroke Paternal Uncle    Hypertension Paternal Uncle    Heart attack Neg Hx    Social History   Socioeconomic History   Marital status: Widowed    Spouse name: Not on file   Number of children: 1   Years of education: Not on file   Highest education level: Not on file  Occupational History   Occupation: Retired    Fish farm manager: RETIRED    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  Tobacco Use   Smoking status: Former    Packs/day: 1.00    Years: 54.00    Pack years: 54.00    Types: Cigarettes    Quit date: 10/05/1998    Years since quitting: 22.4   Smokeless tobacco: Never  Vaping Use   Vaping Use: Never used  Substance and Sexual Activity   Alcohol use: Not Currently    Alcohol/week: 0.0 standard drinks   Drug use: Never   Sexual activity: Not Currently  Other Topics Concern   Not on file  Social History Narrative   Not on file   Social Determinants of Health   Financial Resource Strain: Not on file  Food Insecurity: Not on file  Transportation Needs: Not on file  Physical Activity: Not on file  Stress: Not on file  Social Connections: Not on file    Review of Systems  Constitutional:  Positive for fatigue. Negative for chills and fever.  HENT:  Negative for congestion, ear pain and sore throat.   Respiratory:  Negative for cough and shortness of breath.   Cardiovascular:  Negative for chest pain and palpitations.  Gastrointestinal:  Negative for abdominal pain,  constipation, diarrhea, nausea and vomiting.  Endocrine: Negative for polydipsia, polyphagia and polyuria.  Genitourinary:  Negative for difficulty urinating and dysuria.  Musculoskeletal:  Negative for arthralgias, back pain and myalgias.  Skin:  Negative for rash.  Neurological:  Negative for headaches.  Psychiatric/Behavioral:  Negative for dysphoric mood. The patient is not nervous/anxious.     Objective:  BP 120/70    Pulse 77    Temp 97.7 F (36.5 C)  Resp (!) 115    Ht 5\' 2"  (1.575 m)    Wt 126 lb (57.2 kg)    SpO2 97%    BMI 23.05 kg/m   BP/Weight 03/08/2021 02/26/2021 8/58/8502  Systolic BP 774 128 786  Diastolic BP 70 64 67  Wt. (Lbs) 126 - 123.8  BMI 23.05 - 22.64    Physical Exam Vitals reviewed.  Constitutional:      Appearance: Normal appearance. She is normal weight.  HENT:     Head: Normocephalic.     Right Ear: Tympanic membrane, ear canal and external ear normal.     Left Ear: Tympanic membrane, ear canal and external ear normal.     Nose: Nose normal.     Mouth/Throat:     Mouth: Mucous membranes are moist.     Pharynx: Oropharynx is clear. No posterior oropharyngeal erythema.  Eyes:     Extraocular Movements: Extraocular movements intact.     Conjunctiva/sclera: Conjunctivae normal.     Pupils: Pupils are equal, round, and reactive to light.  Neck:     Vascular: No carotid bruit.  Cardiovascular:     Rate and Rhythm: Normal rate and regular rhythm.     Pulses: Normal pulses.     Heart sounds: Normal heart sounds. No murmur heard. Pulmonary:     Effort: Pulmonary effort is normal.     Breath sounds: Normal breath sounds.  Abdominal:     General: Bowel sounds are normal.     Palpations: Abdomen is soft. There is no mass.     Tenderness: There is no abdominal tenderness.  Musculoskeletal:        General: Normal range of motion.     Cervical back: Normal range of motion. No tenderness.     Right lower leg: No edema.     Left lower leg: No edema.   Skin:    General: Skin is warm.     Capillary Refill: Capillary refill takes less than 2 seconds.  Neurological:     General: No focal deficit present.     Mental Status: She is alert and oriented to person, place, and time.     Gait: Gait normal.     Deep Tendon Reflexes: Reflexes normal.  Psychiatric:        Mood and Affect: Mood normal.        Behavior: Behavior normal.        Thought Content: Thought content normal.    Diabetic Foot Exam - Simple   Simple Foot Form Diabetic Foot exam was performed with the following findings: Yes 03/08/2021  3:30 PM  Visual Inspection See comments: Yes Sensation Testing Intact to touch and monofilament testing bilaterally: Yes Pulse Check See comments: Yes Comments Capillary filling is slow. Bunions on both feet.      Lab Results  Component Value Date   WBC 10.7 03/06/2021   HGB 11.5 03/06/2021   HCT 37.8 03/06/2021   PLT 233 03/06/2021   GLUCOSE 100 (H) 03/06/2021   CHOL 123 03/06/2021   TRIG 132 03/06/2021   HDL 34 (L) 03/06/2021   LDLCALC 65 03/06/2021   ALT 228 (HH) 03/06/2021   AST 286 (HH) 03/06/2021   NA 140 03/06/2021   K 4.8 03/06/2021   CL 100 03/06/2021   CREATININE 1.20 (H) 03/06/2021   BUN 14 03/06/2021   CO2 26 03/06/2021   TSH 4.310 01/16/2021   INR 1.0 08/08/2020   HGBA1C 7.1 (H) 03/06/2021   MICROALBUR 10 06/22/2020  Assessment & Plan:   Problem List Items Addressed This Visit       Cardiovascular and Mediastinum   Essential hypertension An individual hypertension care plan was established and reinforced today.  The patient's status was assessed using clinical findings on exam and labs or diagnostic tests. The patient's success at meeting treatment goals on disease specific evidence-based guidelines and found to be well controlled. SELF MANAGEMENT: The patient and I together assessed ways to personally work towards obtaining the recommended goals. RECOMMENDATIONS: avoid decongestants found in  common cold remedies, decrease consumption of alcohol, perform routine monitoring of BP with home BP cuff, exercise, reduction of dietary salt, take medicines as prescribed, try not to miss doses and quit smoking.  Regular exercise and maintaining a healthy weight is needed.  Stress reduction may help. A CLINICAL SUMMARY including written plan identify barriers to care unique to individual due to social or financial issues.  We attempt to mutually creat solutions for individual and family understanding.     Coronary atherosclerosis An individual plan was formulated based on patient history and exam, labs and evidence based data. Patient has not had recent angina or nitroglycerin use. continue present treatment.     Paroxysmal atrial fibrillation [facility]) Patient has a diagnosis of permanent atrial fibrillation.   Patient is on eliquis and has controlled ventricular response.  Patient is CV stable .    Obesity, diabetes, and hypertension syndrome ([place]) - Primary An individual care plan for diabetes was established and reinforced today.  The patient's status was assessed using clinical findings on exam, labs and diagnostic testing. Patient success at meeting goals based on disease specific evidence-based guidelines and found to be good controlled. Medications were assessed and patient's understanding of the medical issues , including barriers were assessed. Recommend adherence to a diabetic diet, a graduated exercise program, HgbA1c level is checked quarterly, and urine microalbumin performed yearly .  Annual mono-filament sensation testing performed. Lower blood pressure and control hyperlipidemia is important. Get annual eye exams and annual flu shots and smoking cessation discussed.  Self management goals were discussed.     Type 2 diabetes mellitus with peripheral angiopathy (HCC) An individual care plan for diabetes was established and reinforced today.  The patient's status was assessed using  clinical findings on exam, labs and diagnostic testing. Patient success at meeting goals based on disease specific evidence-based guidelines and found to be good controlled. Medications were assessed and patient's understanding of the medical issues , including barriers were assessed. Recommend adherence to a diabetic diet, a graduated exercise program, HgbA1c level is checked quarterly, and urine microalbumin performed yearly .  Annual mono-filament sensation testing performed. Lower blood pressure and control hyperlipidemia is important. Get annual eye exams and annual flu shots and smoking cessation discussed.  Self management goals were discussed.      Digestive   Hepatocellular carcinoma [facility]) She is doing well on immunotherapy     Endocrine   Diabetic glomerulopathy ([place])   Relevant Orders   Microalbumin / creatinine urine ratio An individual care plan for diabetes was established and reinforced today.  The patient's status was assessed using clinical findings on exam, labs and diagnostic testing. Patient success at meeting goals based on disease specific evidence-based guidelines and found to be good controlled. Medications were assessed and patient's understanding of the medical issues , including barriers were assessed. Recommend adherence to a diabetic diet, a graduated exercise program, HgbA1c level is checked quarterly, and urine microalbumin performed yearly .  Annual mono-filament sensation testing performed. Lower blood pressure and control hyperlipidemia is important. Get annual eye exams and annual flu shots and smoking cessation discussed.  Self management goals were discussed.      Nervous and Auditory   Mixed conductive and sensorineural hearing loss, Patient has hearing aids    Hemiparesis affecting left side as late effect of cerebrovascular accident [facility]) Patient has some left sided weakness after stroke     Other   Secondary hypercoagulable state [facility]) Patient is on  eliquis for atrial fibrillation    Depression, major, single episode, moderate ([place]) Depression well controlled  .  30 plus minute visit with review of oncology records  Orders Placed This Encounter  Procedures   Microalbumin / creatinine urine ratio    I,Everson Mott Riner,acting as a scribe for Reinaldo Meeker, MD.,have documented all relevant documentation on the behalf of Reinaldo Meeker, MD,as directed by  Reinaldo Meeker, MD while in the presence of Reinaldo Meeker, MD.   Follow-up: Return in about 3 months (around 06/06/2021).  An After Visit Summary was printed and given to the patient.  Reinaldo Meeker, MD [facility] [phone]

## 2021-03-09 LAB — MICROALBUMIN / CREATININE URINE RATIO
Creatinine, Urine: 300.2 mg/dL
Microalb/Creat Ratio: 11 mg/g creat (ref 0–29)
Microalbumin, Urine: 32.3 ug/mL

## 2021-03-11 NOTE — Progress Notes (Signed)
Microalbuminuria normal lp

## 2021-03-14 ENCOUNTER — Other Ambulatory Visit: Payer: Self-pay

## 2021-03-17 ENCOUNTER — Other Ambulatory Visit: Payer: Self-pay | Admitting: Oncology

## 2021-03-20 ENCOUNTER — Encounter: Payer: Self-pay | Admitting: *Deleted

## 2021-03-20 ENCOUNTER — Inpatient Hospital Stay: Payer: Medicare Other | Attending: Nurse Practitioner

## 2021-03-20 ENCOUNTER — Ambulatory Visit
Admission: RE | Admit: 2021-03-20 | Discharge: 2021-03-20 | Disposition: A | Discharge status: Home / Self Care | Payer: Medicare Other | Source: Ambulatory Visit | Attending: Oncology | Admitting: Oncology

## 2021-03-20 ENCOUNTER — Other Ambulatory Visit: Payer: Self-pay

## 2021-03-20 DIAGNOSIS — Z85828 Personal history of other malignant neoplasm of skin: Secondary | ICD-10-CM | POA: Insufficient documentation

## 2021-03-20 DIAGNOSIS — K7689 Other specified diseases of liver: Secondary | ICD-10-CM | POA: Diagnosis not present

## 2021-03-20 DIAGNOSIS — I517 Cardiomegaly: Secondary | ICD-10-CM | POA: Diagnosis not present

## 2021-03-20 DIAGNOSIS — C220 Liver cell carcinoma: Secondary | ICD-10-CM

## 2021-03-20 DIAGNOSIS — E119 Type 2 diabetes mellitus without complications: Secondary | ICD-10-CM | POA: Diagnosis not present

## 2021-03-20 DIAGNOSIS — K8020 Calculus of gallbladder without cholecystitis without obstruction: Secondary | ICD-10-CM | POA: Diagnosis not present

## 2021-03-20 DIAGNOSIS — R634 Abnormal weight loss: Secondary | ICD-10-CM | POA: Insufficient documentation

## 2021-03-20 DIAGNOSIS — Z87442 Personal history of urinary calculi: Secondary | ICD-10-CM | POA: Insufficient documentation

## 2021-03-20 DIAGNOSIS — I2510 Atherosclerotic heart disease of native coronary artery without angina pectoris: Secondary | ICD-10-CM | POA: Diagnosis not present

## 2021-03-20 DIAGNOSIS — J90 Pleural effusion, not elsewhere classified: Secondary | ICD-10-CM | POA: Diagnosis not present

## 2021-03-20 DIAGNOSIS — Z5112 Encounter for antineoplastic immunotherapy: Secondary | ICD-10-CM | POA: Diagnosis not present

## 2021-03-20 DIAGNOSIS — M109 Gout, unspecified: Secondary | ICD-10-CM | POA: Diagnosis not present

## 2021-03-20 DIAGNOSIS — I4891 Unspecified atrial fibrillation: Secondary | ICD-10-CM | POA: Insufficient documentation

## 2021-03-20 LAB — CMP (CANCER CENTER ONLY)
ALT: 209 U/L — ABNORMAL HIGH (ref 0–44)
AST: 245 U/L (ref 15–41)
Albumin: 3.5 g/dL (ref 3.5–5.0)
Alkaline Phosphatase: 177 U/L — ABNORMAL HIGH (ref 38–126)
Anion gap: 10 (ref 5–15)
BUN: 16 mg/dL (ref 8–23)
CO2: 25 mmol/L (ref 22–32)
Calcium: 9.1 mg/dL (ref 8.9–10.3)
Chloride: 102 mmol/L (ref 98–111)
Creatinine: 1.16 mg/dL — ABNORMAL HIGH (ref 0.44–1.00)
GFR, Estimated: 44 mL/min — ABNORMAL LOW (ref >60)
Glucose, Bld: 244 mg/dL — ABNORMAL HIGH (ref 70–99)
Potassium: 4.1 mmol/L (ref 3.5–5.1)
Sodium: 137 mmol/L (ref 135–145)
Total Bilirubin: 0.6 mg/dL (ref 0.3–1.2)
Total Protein: 6.7 g/dL (ref 6.5–8.1)

## 2021-03-20 MED ORDER — GADOBENATE DIMEGLUMINE 529 MG/ML IV SOLN
11.0000 mL | Freq: Once | INTRAVENOUS | Status: AC | PRN
  Administered 2021-03-20: 11 mL via INTRAVENOUS

## 2021-03-20 NOTE — Progress Notes (Signed)
CRITICAL VALUE STICKER  CRITICAL VALUE: AST [id number]  RECEIVER (on-site recipient of call):Avalynn Bowe,RN  DATE & TIME NOTIFIED: 03/20/21 @ 1043  MESSENGER (representative from lab):Phyllis  MD NOTIFIED: Dr. Benay Spice  TIME OF NOTIFICATION:1045  RESPONSE:  No changes. Having MRI liver today

## 2021-03-21 ENCOUNTER — Other Ambulatory Visit: Payer: Self-pay

## 2021-03-21 DIAGNOSIS — C220 Liver cell carcinoma: Secondary | ICD-10-CM

## 2021-03-22 ENCOUNTER — Inpatient Hospital Stay: Payer: Medicare Other

## 2021-03-22 ENCOUNTER — Inpatient Hospital Stay (HOSPITAL_BASED_OUTPATIENT_CLINIC_OR_DEPARTMENT_OTHER): Payer: Medicare Other | Admitting: Nurse Practitioner

## 2021-03-22 ENCOUNTER — Telehealth: Payer: Self-pay

## 2021-03-22 ENCOUNTER — Encounter: Payer: Self-pay | Admitting: *Deleted

## 2021-03-22 ENCOUNTER — Other Ambulatory Visit: Payer: Self-pay

## 2021-03-22 ENCOUNTER — Encounter: Payer: Self-pay | Admitting: Nurse Practitioner

## 2021-03-22 VITALS — BP 129/63 | HR 69 | Resp 18

## 2021-03-22 VITALS — BP 127/62 | HR 62 | Temp 98.1°F | Resp 20 | Ht 62.0 in | Wt 124.0 lb

## 2021-03-22 DIAGNOSIS — C220 Liver cell carcinoma: Secondary | ICD-10-CM

## 2021-03-22 DIAGNOSIS — I2510 Atherosclerotic heart disease of native coronary artery without angina pectoris: Secondary | ICD-10-CM | POA: Diagnosis not present

## 2021-03-22 DIAGNOSIS — I517 Cardiomegaly: Secondary | ICD-10-CM | POA: Diagnosis not present

## 2021-03-22 DIAGNOSIS — J90 Pleural effusion, not elsewhere classified: Secondary | ICD-10-CM | POA: Diagnosis not present

## 2021-03-22 DIAGNOSIS — K8020 Calculus of gallbladder without cholecystitis without obstruction: Secondary | ICD-10-CM | POA: Diagnosis not present

## 2021-03-22 DIAGNOSIS — Z5112 Encounter for antineoplastic immunotherapy: Secondary | ICD-10-CM | POA: Diagnosis not present

## 2021-03-22 LAB — CBC WITH DIFFERENTIAL (CANCER CENTER ONLY)
Abs Immature Granulocytes: 0.01 10*3/uL (ref 0.00–0.07)
Basophils Absolute: 0 10*3/uL (ref 0.0–0.1)
Basophils Relative: 1 %
Eosinophils Absolute: 0.5 10*3/uL (ref 0.0–0.5)
Eosinophils Relative: 7 %
HCT: 39.2 % (ref 36.0–46.0)
Hemoglobin: 11.4 g/dL — ABNORMAL LOW (ref 12.0–15.0)
Immature Granulocytes: 0 %
Lymphocytes Relative: 35 %
Lymphs Abs: 2.2 10*3/uL (ref 0.7–4.0)
MCH: 23.2 pg — ABNORMAL LOW (ref 26.0–34.0)
MCHC: 29.1 g/dL — ABNORMAL LOW (ref 30.0–36.0)
MCV: 79.7 fL — ABNORMAL LOW (ref 80.0–100.0)
Monocytes Absolute: 0.5 10*3/uL (ref 0.1–1.0)
Monocytes Relative: 8 %
Neutro Abs: 3.2 10*3/uL (ref 1.7–7.7)
Neutrophils Relative %: 49 %
Platelet Count: 186 10*3/uL (ref 150–400)
RBC: 4.92 MIL/uL (ref 3.87–5.11)
RDW: 19.9 % — ABNORMAL HIGH (ref 11.5–15.5)
WBC Count: 6.4 10*3/uL (ref 4.0–10.5)
nRBC: 0 % (ref 0.0–0.2)

## 2021-03-22 MED ORDER — SODIUM CHLORIDE 0.9 % IV SOLN
15.0000 mg/kg | Freq: Once | INTRAVENOUS | Status: AC
  Administered 2021-03-22: 900 mg via INTRAVENOUS
  Filled 2021-03-22: qty 32

## 2021-03-22 MED ORDER — SODIUM CHLORIDE 0.9 % IV SOLN
1200.0000 mg | Freq: Once | INTRAVENOUS | Status: AC
  Administered 2021-03-22: 1200 mg via INTRAVENOUS
  Filled 2021-03-22: qty 20

## 2021-03-22 MED ORDER — SODIUM CHLORIDE 0.9 % IV SOLN: Freq: Once | INTRAVENOUS | Status: AC

## 2021-03-22 NOTE — Progress Notes (Signed)
Stacy [name] OFFICE PROGRESS NOTE   Diagnosis:  Hepatocellular carcinoma  INTERVAL HISTORY:   Stacy [name] returns as scheduled.  She completed another cycle of atezolizumab/bevacizumab 02/26/2021.  She feels well.  No complaints today.  She denies pain.  She has a good appetite.  No nausea or vomiting.  No mouth sores.  No diarrhea.  No skin rash.  She has occasional hemorrhoidal bleeding.  No other bleeding.  She denies fever, cough, shortness of breath.  Objective:  Vital signs in last 24 hours:  Blood pressure 127/62, pulse 62, temperature 98.1 F (36.7 C), temperature source Oral, resp. rate 20, height 5\' 2"  (1.575 m), weight 124 lb (56.2 kg), SpO2 100 %.    HEENT: No thrush or ulcers. Resp: Lungs clear bilaterally. Cardio: Regular rate and rhythm. GI: Abdomen soft and nontender.  No hepatomegaly. Vascular: No leg edema. Skin: No rash.   Lab Results:  Lab Results  Component Value Date   WBC 6.4 03/22/2021   HGB 11.4 (L) 03/22/2021   HCT 39.2 03/22/2021   MCV 79.7 (L) 03/22/2021   PLT 186 03/22/2021   NEUTROABS 3.2 03/22/2021    Imaging:  MR LIVER W WO CONTRAST  Result Date: 03/21/2021 CLINICAL DATA:  Restaging of hepatocellular carcinoma. Status post immunotherapy. EXAM: MRI ABDOMEN WITHOUT AND WITH CONTRAST TECHNIQUE: Multiplanar multisequence MR imaging of the abdomen was performed both before and after the administration of intravenous contrast. CONTRAST:  24mL MULTIHANCE GADOBENATE DIMEGLUMINE 529 MG/ML IV SOLN COMPARISON:  Clinic note of 02/26/2021.  Prior MRI of 11/07/2020 FINDINGS: Mild motion degradation, especially involving the pre and postcontrast dynamic images. Lower chest: A small right pleural effusion is new. Mild cardiomegaly. Hepatobiliary: Scattered hepatic cysts. Again identified are multiple hypervascular lesions throughout both lobes of the liver. Especially given the motion on the dynamic postcontrast images, these are most directly  compared on diffusion-weighted imaging.: A dominant conglomerate of masses involving the posterior right hepatic lobe measures 10.7 x 7.2 cm on 73/8. Similar to 10.6 x 7.7 cm on 93/6 of the prior exam (when remeasured). An index segment 3 mass measures 3.7 cm on 53/8 versus 3.9 cm on the prior exam (when remeasured). An index segment 5 lesion measures 2.4 cm on 55/8 and is similar to 2.3 cm on the prior exam (when remeasured). 8 mm gallstone without specific evidence of acute cholecystitis. No biliary duct dilatation. Pancreas:  Normal, without mass or ductal dilatation. Spleen:  Normal in size, without focal abnormality. Adrenals/Urinary Tract: Similar size of a left adrenal nodule including at 1.4 cm on 14/4. Likely an adenoma, but indeterminate based on in/out of phase imaging. Normal right adrenal gland. Tiny bilateral renal lesions which are likely cysts. No hydronephrosis. Stomach/Bowel: Normal stomach and small bowel. Large colonic stool burden. Vascular/Lymphatic: Advanced aortic and branch vessel atherosclerosis. Portal and splenic veins are patent. No abdominal adenopathy. Other:  No ascites. Musculoskeletal: Superior endplate L1 compression deformity is again identified. IMPRESSION: 1. Motion degradation, especially involving the pre and postcontrast dynamic images. 2. Diffuse, right greater than left multifocal hepatocellular carcinoma, felt to be similar to the 11/07/2020 exam. 3. New small right pleural effusion. 4. Cholelithiasis. 5. Similar left adrenal nodule, favoring an adenoma. Electronically Signed   By: Abigail Miyamoto M.D.   On: 03/21/2021 10:14    Medications: I have reviewed the patient's current medications.  Assessment/Plan: Multiple liver masses CT abdomen/pelvis 07/11/2018, low-attenuation liver lesions incompletely characterized without contrast Ultrasound abdomen 07/10/2020-innumerable incompletely assessed hypoechoic masses in the  liver MRI abdomen 07/29/2020- innumerable  hypervascular liver masses, nodular liver contour, 1.5 cm left adrenal mass Biopsy liver lesion 08/08/2020-hepatocellular carcinoma, moderately differentiated Cycle 1 atezolizumab/bevacizumab 08/18/2020 Cycle 2 atezolizumab/bevacizumab 09/07/2020 Cycle 3 atezolizumab/bevacizumab 09/28/2020 Cycle 4 atezolizumab/bevacizumab 10/19/2020 MRI liver 11/07/2020-numerous hyperenhancing liver masses-stable to mildly improved compared to the MRI 07/29/2020 Cycle 5 atezolizumab/bevacizumab 11/10/2020 Cycle 6 atezolizumab/bevacizumab 12/04/2020 Cycle 7 atezolizumab/bevacizumab 12/26/2020 Cycle 8 atezolizumab/bevacizumab 01/16/2021 Cycle 9 atezolizumab/bevacizumab 02/06/2021 Cycle 10 atezolizumab/bevacizumab 02/26/2021 03/20/2021 MRI abdomen-right greater than left multifocal hepatocellular carcinoma, similar to exam 11/07/2020; new small right pleural effusion. Coronary artery disease Atrial fibrillation-maintained on anticoagulation History of basal cell and squamous cell skin cancers Diabetes Anorexia/weight loss Abdominal pain Urinary tract infection 08/30/2020    Disposition: Stacy [name] appears stable.  She has completed 10 cycles of atezolizumab/bevacizumab.  She is tolerating treatment well.  She has a good performance status.  Recent restaging MRI shows stable disease.  Plan to proceed with cycle 11 atezolizumab/bevacizumab today as scheduled.  CBC from today and chemistry panel from 03/20/2021 reviewed.  Labs adequate to proceed with treatment.  Transaminases remain elevated.  We will continue to monitor.  She will return for lab, follow-up, treatment in 3 weeks.  We are available to see her sooner if needed.  Patient seen with Dr. Benay Spice.  MRI images reviewed on the computer with Stacy [name] and her daughter.   Ned Card ANP/GNP-BC   03/22/2021  10:58 AM This was a shared visit with Ned Card.  We reviewed the MRI findings and images with Stacy [name] and her daughter.  The restaging MRI is consistent  with stable disease.  Her clinical status is stable.  She continues to tolerate the atezolizumab/bevacizumab well.  The plan is to continue the current treatment on a 3-week schedule.  I was present for greater than 50% of today's visit.  I informed medical stage making.  Julieanne Manson, MD

## 2021-03-22 NOTE — Progress Notes (Signed)
Patient presents for treatment. RN assessment completed along with the following:  Labs/vitals reviewed - Yes, and Per Ned Card, NP, ok to treat with CMP from 03/20/21.    Weight within 10% of previous measurement - Yes Informed consent completed and reflects current therapy/intent - Yes, on date 08/18/20             Provider progress note reviewed - Yes, today's provider note was reviewed. Treatment/Antibody/Supportive plan reviewed - Yes, and there are no adjustments needed for today's treatment. S&H and other orders reviewed - Yes, and there are no additional orders identified. Previous treatment date reviewed - Yes, and the appropriate amount of time has elapsed between treatments. Clinic Hand Off Received from - Merceda Elks, RN  Patient to proceed with treatment.

## 2021-03-22 NOTE — Patient Instructions (Signed)
[facility]   Discharge Instructions: Thank you for choosing [facility] to provide your oncology and hematology care.   If you have a lab appointment with the [facility], please go directly to the [facility] and check in at the registration area.   Wear comfortable clothing and clothing appropriate for easy access to any Portacath or PICC line.   We strive to give you quality time with your provider. You may need to reschedule your appointment if you arrive late (15 or more minutes).  Arriving late affects you and other patients whose appointments are after yours.  Also, if you miss three or more appointments without notifying the office, you may be dismissed from the clinic at the providers discretion.      For prescription refill requests, have your pharmacy contact our office and allow 72 hours for refills to be completed.    Today you received the following chemotherapy and/or immunotherapy agents Atezolizumab (TECENTRIQ) & Bevacizumab-awwb (MVASI).      To help prevent nausea and vomiting after your treatment, we encourage you to take your nausea medication as directed.  BELOW ARE SYMPTOMS THAT SHOULD BE REPORTED IMMEDIATELY: *FEVER GREATER THAN 100.4 F (38 C) OR HIGHER *CHILLS OR SWEATING *NAUSEA AND VOMITING THAT IS NOT CONTROLLED WITH YOUR NAUSEA MEDICATION *UNUSUAL SHORTNESS OF BREATH *UNUSUAL BRUISING OR BLEEDING *URINARY PROBLEMS (pain or burning when urinating, or frequent urination) *BOWEL PROBLEMS (unusual diarrhea, constipation, pain near the anus) TENDERNESS IN MOUTH AND THROAT WITH OR WITHOUT PRESENCE OF ULCERS (sore throat, sores in mouth, or a toothache) UNUSUAL RASH, SWELLING OR PAIN  UNUSUAL VAGINAL DISCHARGE OR ITCHING   Items with * indicate a potential emergency and should be followed up as soon as possible or go to the Emergency Department if any problems should occur.  Please show the CHEMOTHERAPY ALERT CARD or  IMMUNOTHERAPY ALERT CARD at check-in to the Emergency Department and triage nurse.  Should you have questions after your visit or need to cancel or reschedule your appointment, please contact [facility]  Dept: [phone]  and follow the prompts.  Office hours are 8:00 a.m. to 4:30 p.m. Monday - Friday. Please note that voicemails left after 4:00 p.m. may not be returned until the following business day.  We are closed weekends and major holidays. You have access to a nurse at all times for urgent questions. Please call the main number to the clinic Dept: [phone] and follow the prompts.   For any non-urgent questions, you may also contact your provider using MyChart. We now offer e-Visits for anyone 5 and older to request care online for non-urgent symptoms. For details visit [email or web address].   Also download the MyChart app! Go to the app store, search "MyChart", open the app, select [facility], and log in with your MyChart username and password.  Due to Covid, a mask is required upon entering the hospital/clinic. If you do not have a mask, one will be given to you upon arrival. For doctor visits, patients may have 1 support person aged 42 or older with them. For treatment visits, patients cannot have anyone with them due to current Covid guidelines and our immunocompromised population.   Atezolizumab injection What is this medication? ATEZOLIZUMAB (a te zoe LIZ ue mab) is a monoclonal antibody. It is used to treat bladder cancer (urothelial cancer), liver cancer, lung cancer, and melanoma. This medicine may be used for other purposes; ask your health care  provider or pharmacist if you have questions. COMMON BRAND NAME(S): Tecentriq What should I tell my care team before I take this medication? They need to know if you have any of these conditions: autoimmune diseases like Crohn's disease, ulcerative colitis, or lupus have had or planning to have an  allogeneic stem cell transplant (uses someone else's stem cells) history of organ transplant history of radiation to the chest nervous system problems like myasthenia gravis or Guillain-Barre syndrome an unusual or allergic reaction to atezolizumab, other medicines, foods, dyes, or preservatives pregnant or trying to get pregnant breast-feeding How should I use this medication? This medicine is for infusion into a vein. It is given by a health care professional in a hospital or clinic setting. A special MedGuide will be given to you before each treatment. Be sure to read this information carefully each time. Talk to your pediatrician regarding the use of this medicine in children. Special care may be needed. Overdosage: If you think you have taken too much of this medicine contact a poison control center or emergency room at once. NOTE: This medicine is only for you. Do not share this medicine with others. What if I miss a dose? It is important not to miss your dose. Call your doctor or health care professional if you are unable to keep an appointment. What may interact with this medication? Interactions have not been studied. This list may not describe all possible interactions. Give your health care provider a list of all the medicines, herbs, non-prescription drugs, or dietary supplements you use. Also tell them if you smoke, drink alcohol, or use illegal drugs. Some items may interact with your medicine. What should I watch for while using this medication? Your condition will be monitored carefully while you are receiving this medicine. You may need blood work done while you are taking this medicine. Do not become pregnant while taking this medicine or for at least 5 months after stopping it. Women should inform their doctor if they wish to become pregnant or think they might be pregnant. There is a potential for serious side effects to an unborn child. Talk to your health care professional  or pharmacist for more information. Do not breast-feed an infant while taking this medicine or for at least 5 months after the last dose. What side effects may I notice from receiving this medication? Side effects that you should report to your doctor or health care professional as soon as possible: allergic reactions like skin rash, itching or hives, swelling of the face, lips, or tongue black, tarry stools bloody or watery diarrhea breathing problems changes in vision chest pain or chest tightness chills facial flushing fever headache signs and symptoms of high blood sugar such as dizziness; dry mouth; dry skin; fruity breath; nausea; stomach pain; increased hunger or thirst; increased urination signs and symptoms of liver injury like dark yellow or brown urine; general ill feeling or flu-like symptoms; light-colored stools; loss of appetite; nausea; right upper belly pain; unusually weak or tired; yellowing of the eyes or skin stomach pain trouble passing urine or change in the amount of urine Side effects that usually do not require medical attention (report to your doctor or health care professional if they continue or are bothersome): bone pain cough diarrhea joint pain muscle pain muscle weakness swelling of arms or legs tiredness weight loss This list may not describe all possible side effects. Call your doctor for medical advice about side effects. You may report side effects to  FDA at 1-800-FDA-1088. Where should I keep my medication? This drug is given in a hospital or clinic and will not be stored at home. NOTE: This sheet is a summary. It may not cover all possible information. If you have questions about this medicine, talk to your doctor, pharmacist, or health care provider.  2022 Elsevier/Gold Standard (2020-10-17 00:00:00)  Bevacizumab injection What is this medication? BEVACIZUMAB (be va SIZ yoo mab) is a monoclonal antibody. It is used to treat many types of  cancer. This medicine may be used for other purposes; ask your health care provider or pharmacist if you have questions. COMMON BRAND NAME(S): Alymsys, Avastin, MVASI, Noah Charon What should I tell my care team before I take this medication? They need to know if you have any of these conditions: diabetes heart disease high blood pressure history of coughing up blood prior anthracycline chemotherapy (e.g., doxorubicin, daunorubicin, epirubicin) recent or ongoing radiation therapy recent or planning to have surgery stroke an unusual or allergic reaction to bevacizumab, hamster proteins, mouse proteins, other medicines, foods, dyes, or preservatives pregnant or trying to get pregnant breast-feeding How should I use this medication? This medicine is for infusion into a vein. It is given by a health care professional in a hospital or clinic setting. Talk to your pediatrician regarding the use of this medicine in children. Special care may be needed. Overdosage: If you think you have taken too much of this medicine contact a poison control center or emergency room at once. NOTE: This medicine is only for you. Do not share this medicine with others. What if I miss a dose? It is important not to miss your dose. Call your doctor or health care professional if you are unable to keep an appointment. What may interact with this medication? Interactions are not expected. This list may not describe all possible interactions. Give your health care provider a list of all the medicines, herbs, non-prescription drugs, or dietary supplements you use. Also tell them if you smoke, drink alcohol, or use illegal drugs. Some items may interact with your medicine. What should I watch for while using this medication? Your condition will be monitored carefully while you are receiving this medicine. You will need important blood work and urine testing done while you are taking this medicine. This medicine may increase  your risk to bruise or bleed. Call your doctor or health care professional if you notice any unusual bleeding. Before having surgery, talk to your health care provider to make sure it is ok. This drug can increase the risk of poor healing of your surgical site or wound. You will need to stop this drug for 28 days before surgery. After surgery, wait at least 28 days before restarting this drug. Make sure the surgical site or wound is healed enough before restarting this drug. Talk to your health care provider if questions. Do not become pregnant while taking this medicine or for 6 months after stopping it. Women should inform their doctor if they wish to become pregnant or think they might be pregnant. There is a potential for serious side effects to an unborn child. Talk to your health care professional or pharmacist for more information. Do not breast-feed an infant while taking this medicine and for 6 months after the last dose. This medicine has caused ovarian failure in some women. This medicine may interfere with the ability to have a child. You should talk to your doctor or health care professional if you are concerned about your  fertility. What side effects may I notice from receiving this medication? Side effects that you should report to your doctor or health care professional as soon as possible: allergic reactions like skin rash, itching or hives, swelling of the face, lips, or tongue chest pain or chest tightness chills coughing up blood high fever seizures severe constipation signs and symptoms of bleeding such as bloody or black, tarry stools; red or dark-brown urine; spitting up blood or brown material that looks like coffee grounds; red spots on the skin; unusual bruising or bleeding from the eye, gums, or nose signs and symptoms of a blood clot such as breathing problems; chest pain; severe, sudden headache; pain, swelling, warmth in the leg signs and symptoms of a stroke like changes  in vision; confusion; trouble speaking or understanding; severe headaches; sudden numbness or weakness of the face, arm or leg; trouble walking; dizziness; loss of balance or coordination stomach pain sweating swelling of legs or ankles vomiting weight gain Side effects that usually do not require medical attention (report to your doctor or health care professional if they continue or are bothersome): back pain changes in taste decreased appetite dry skin nausea tiredness This list may not describe all possible side effects. Call your doctor for medical advice about side effects. You may report side effects to FDA at 1-800-FDA-1088. Where should I keep my medication? This drug is given in a hospital or clinic and will not be stored at home. NOTE: This sheet is a summary. It may not cover all possible information. If you have questions about this medicine, talk to your doctor, pharmacist, or health care provider.  2022 Elsevier/Gold Standard (2020-10-17 00:00:00)

## 2021-03-22 NOTE — Chronic Care Management (AMB) (Signed)
° ° °  Chronic Care Management Pharmacy Assistant   Name: Stacy [name]  MRN: [id number] DOB: 01/04/1927   Reason for Encounter: Called to reschedule initial visit with [facility].    03/22/21- Called pt to see if she wanted to reschedule her canceled  initial visit with [facility]. I left vm to return my call   Medications: Outpatient Encounter Medications as of 03/22/2021  Medication Sig   ACCU-CHEK SMARTVIEW test strip 1 each by Other route as needed for other (blood sugar test strips).    apixaban (ELIQUIS) 2.5 MG TABS tablet Take 1 tablet (2.5 mg total) by mouth 2 (two) times daily.   cetirizine (ZYRTEC) 10 MG tablet TAKE 1 TABLET BY MOUTH EVERY DAY   diphenhydrAMINE (BENADRYL) 25 MG tablet Take 25 mg by mouth every 6 (six) hours as needed.   Famotidine-Ca Carb-Mag Hydrox (PEPCID COMPLETE PO) Take by mouth.   linaclotide (LINZESS) 72 MCG capsule Take 72 mcg by mouth daily. As needed   metoprolol succinate (TOPROL XL) 25 MG 24 hr tablet Take 1 tablet (25 mg total) by mouth in the morning and at bedtime.   mometasone (ELOCON) 0.1 % cream mometasone 0.1 % topical cream  APPLY TO AFFECTED AREA EVERY DAY AS NEEDED ITCHING   NEOMYCIN-POLYMYXIN-HYDROCORTISONE (CORTISPORIN) 1 % SOLN otic solution Place 2 drops into both ears as directed.   nitroGLYCERIN (NITROSTAT) 0.4 MG SL tablet Place 1 tablet (0.4 mg total) under the tongue every 5 (five) minutes as needed for chest pain.   ondansetron (ZOFRAN ODT) 4 MG disintegrating tablet Take 1 tablet (4 mg total) by mouth every 8 (eight) hours as needed for nausea or vomiting.   rosuvastatin (CRESTOR) 10 MG tablet TAKE 1 TABLET BY MOUTH EVERY DAY   sertraline (ZOLOFT) 50 MG tablet TAKE 1 TABLET BY MOUTH EVERY DAY   traMADol (ULTRAM) 50 MG tablet Take 0.5-1 tablets (25-50 mg total) by mouth every 12 (twelve) hours as needed.   No facility-administered encounter medications on file as of 03/22/2021.   Lipid Panel    Component Value Date/Time   CHOL 123 03/06/2021 1102    TRIG 132 03/06/2021 1102   HDL 34 (L) 03/06/2021 1102   LDLCALC 65 03/06/2021 [place], [facility] Pharmacist Assistant  [phone]

## 2021-03-22 NOTE — Progress Notes (Signed)
Patient seen by Ned Card NP today  Vitals are within treatment parameters.  Labs reviewed by Ned Card NP  O OK to treat w/CMP of 2/723 (liver functions always elevated)  Per physician team, patient is ready for treatment and there are NO modifications to the treatment plan.

## 2021-04-03 DIAGNOSIS — Z85828 Personal history of other malignant neoplasm of skin: Secondary | ICD-10-CM | POA: Diagnosis not present

## 2021-04-03 DIAGNOSIS — H61001 Unspecified perichondritis of right external ear: Secondary | ICD-10-CM | POA: Diagnosis not present

## 2021-04-08 ENCOUNTER — Other Ambulatory Visit: Payer: Self-pay | Admitting: Oncology

## 2021-04-12 ENCOUNTER — Other Ambulatory Visit: Payer: Self-pay | Admitting: *Deleted

## 2021-04-12 ENCOUNTER — Encounter: Payer: Self-pay | Admitting: *Deleted

## 2021-04-12 ENCOUNTER — Inpatient Hospital Stay: Payer: Medicare Other | Attending: Nurse Practitioner | Admitting: Oncology

## 2021-04-12 ENCOUNTER — Inpatient Hospital Stay: Payer: Medicare Other

## 2021-04-12 ENCOUNTER — Other Ambulatory Visit: Payer: Self-pay

## 2021-04-12 VITALS — BP 116/58 | HR 66 | Temp 97.8°F | Resp 18 | Ht 62.0 in | Wt 123.4 lb

## 2021-04-12 DIAGNOSIS — E119 Type 2 diabetes mellitus without complications: Secondary | ICD-10-CM | POA: Diagnosis not present

## 2021-04-12 DIAGNOSIS — I2510 Atherosclerotic heart disease of native coronary artery without angina pectoris: Secondary | ICD-10-CM | POA: Diagnosis not present

## 2021-04-12 DIAGNOSIS — R5383 Other fatigue: Secondary | ICD-10-CM | POA: Diagnosis not present

## 2021-04-12 DIAGNOSIS — R634 Abnormal weight loss: Secondary | ICD-10-CM | POA: Insufficient documentation

## 2021-04-12 DIAGNOSIS — C220 Liver cell carcinoma: Secondary | ICD-10-CM

## 2021-04-12 DIAGNOSIS — Z85828 Personal history of other malignant neoplasm of skin: Secondary | ICD-10-CM | POA: Diagnosis not present

## 2021-04-12 DIAGNOSIS — I4891 Unspecified atrial fibrillation: Secondary | ICD-10-CM | POA: Diagnosis not present

## 2021-04-12 DIAGNOSIS — Z5111 Encounter for antineoplastic chemotherapy: Secondary | ICD-10-CM | POA: Insufficient documentation

## 2021-04-12 DIAGNOSIS — Z87442 Personal history of urinary calculi: Secondary | ICD-10-CM | POA: Diagnosis not present

## 2021-04-12 DIAGNOSIS — R748 Abnormal levels of other serum enzymes: Secondary | ICD-10-CM | POA: Diagnosis not present

## 2021-04-12 DIAGNOSIS — R109 Unspecified abdominal pain: Secondary | ICD-10-CM | POA: Insufficient documentation

## 2021-04-12 DIAGNOSIS — R630 Anorexia: Secondary | ICD-10-CM | POA: Diagnosis not present

## 2021-04-12 DIAGNOSIS — C221 Intrahepatic bile duct carcinoma: Secondary | ICD-10-CM | POA: Diagnosis not present

## 2021-04-12 LAB — CMP (CANCER CENTER ONLY)
ALT: 393 U/L (ref 0–44)
AST: 360 U/L (ref 15–41)
Albumin: 3.5 g/dL (ref 3.5–5.0)
Alkaline Phosphatase: 236 U/L — ABNORMAL HIGH (ref 38–126)
Anion gap: 10 (ref 5–15)
BUN: 20 mg/dL (ref 8–23)
CO2: 24 mmol/L (ref 22–32)
Calcium: 9.4 mg/dL (ref 8.9–10.3)
Chloride: 105 mmol/L (ref 98–111)
Creatinine: 1.17 mg/dL — ABNORMAL HIGH (ref 0.44–1.00)
GFR, Estimated: 44 mL/min — ABNORMAL LOW (ref >60)
Glucose, Bld: 203 mg/dL — ABNORMAL HIGH (ref 70–99)
Potassium: 4.2 mmol/L (ref 3.5–5.1)
Sodium: 139 mmol/L (ref 135–145)
Total Bilirubin: 0.6 mg/dL (ref 0.3–1.2)
Total Protein: 7 g/dL (ref 6.5–8.1)

## 2021-04-12 LAB — TOTAL PROTEIN, URINE DIPSTICK

## 2021-04-12 LAB — CBC WITH DIFFERENTIAL (CANCER CENTER ONLY)
Abs Immature Granulocytes: 0.01 10*3/uL (ref 0.00–0.07)
Basophils Absolute: 0.1 10*3/uL (ref 0.0–0.1)
Basophils Relative: 1 %
Eosinophils Absolute: 0.4 10*3/uL (ref 0.0–0.5)
Eosinophils Relative: 5 %
HCT: 39.3 % (ref 36.0–46.0)
Hemoglobin: 11.5 g/dL — ABNORMAL LOW (ref 12.0–15.0)
Immature Granulocytes: 0 %
Lymphocytes Relative: 30 %
Lymphs Abs: 2.3 10*3/uL (ref 0.7–4.0)
MCH: 23.3 pg — ABNORMAL LOW (ref 26.0–34.0)
MCHC: 29.3 g/dL — ABNORMAL LOW (ref 30.0–36.0)
MCV: 79.6 fL — ABNORMAL LOW (ref 80.0–100.0)
Monocytes Absolute: 0.6 10*3/uL (ref 0.1–1.0)
Monocytes Relative: 8 %
Neutro Abs: 4.2 10*3/uL (ref 1.7–7.7)
Neutrophils Relative %: 56 %
Platelet Count: 191 10*3/uL (ref 150–400)
RBC: 4.94 MIL/uL (ref 3.87–5.11)
RDW: 20.5 % — ABNORMAL HIGH (ref 11.5–15.5)
WBC Count: 7.6 10*3/uL (ref 4.0–10.5)
nRBC: 0 % (ref 0.0–0.2)

## 2021-04-12 MED ORDER — PREDNISONE 10 MG PO TABS: 20.0000 mg | ORAL_TABLET | Freq: Every day | ORAL | 0 refills | Status: DC

## 2021-04-12 NOTE — Progress Notes (Signed)
Patient seen by Dr. Benay Spice today ? ?Vitals are within treatment parameters. ? ?Labs reviewed by Dr. Benay Spice and are not all within treatment parameters.   ?AST 360 and ALT 393-- ? ?Per physician team,  She will not receive treatment today.   ?

## 2021-04-12 NOTE — Progress Notes (Signed)
Prednisone sent to pharmacy per MD orders. ?

## 2021-04-12 NOTE — Progress Notes (Signed)
?  [facility] ?OFFICE PROGRESS NOTE ? ? ?Diagnosis: Hepatocellular carcinoma ? ?INTERVAL HISTORY:  ? ?Stacy [name] returns as scheduled.  Repeat another treatment with atezolizumab/bevacizumab on 03/22/2021.  No rash, diarrhea, or pain.  No new complaint. ? ?Objective: ? ?Vital signs in last 24 hours: ? ?Blood pressure (!) 116/58, pulse 66, temperature 97.8 ?F (36.6 ?C), temperature source Oral, resp. rate 18, height 5\' 2"  (1.575 m), weight 123 lb 6.4 oz (56 kg), SpO2 99 %. ?  ? ?Resp: Lungs clear bilaterally ?Cardio: Regular rate and rhythm ?GI: No hepatosplenomegaly, nontender ?Vascular: No leg edema  ?Skin: No rash ? ? ?Lab Results: ? ?Lab Results  ?Component Value Date  ? WBC 7.6 04/12/2021  ? HGB 11.5 (L) 04/12/2021  ? HCT 39.3 04/12/2021  ? MCV 79.6 (L) 04/12/2021  ? PLT 191 04/12/2021  ? NEUTROABS 4.2 04/12/2021  ? ? ?CMP  ?Lab Results  ?Component Value Date  ? NA 137 03/20/2021  ? K 4.1 03/20/2021  ? CL 102 03/20/2021  ? CO2 25 03/20/2021  ? GLUCOSE 244 (H) 03/20/2021  ? BUN 16 03/20/2021  ? CREATININE 1.16 (H) 03/20/2021  ? CALCIUM 9.1 03/20/2021  ? PROT 6.7 03/20/2021  ? ALBUMIN 3.5 03/20/2021  ? AST 245 (HH) 03/20/2021  ? ALT 209 (H) 03/20/2021  ? ALKPHOS 177 (H) 03/20/2021  ? BILITOT 0.6 03/20/2021  ? GFRNONAA 44 (L) 03/20/2021  ? GFRAA 54 (L) 02/22/2020  ? ? ?Lab Results  ?Component Value Date  ? CEA 2.45 08/02/2020  ? [id number] 27 08/02/2020  ? ? ?Medications: I have reviewed the patient's current medications. ? ? ?Assessment/Plan: ? ?Multiple liver masses ?CT abdomen/pelvis 07/11/2018, low-attenuation liver lesions incompletely characterized without contrast ?Ultrasound abdomen 07/10/2020-innumerable incompletely assessed hypoechoic masses in the liver ?MRI abdomen 07/29/2020- innumerable hypervascular liver masses, nodular liver contour, 1.5 cm left adrenal mass ?Biopsy liver lesion 08/08/2020-hepatocellular carcinoma, moderately differentiated ?Cycle 1 atezolizumab/bevacizumab 08/18/2020 ?Cycle 2  atezolizumab/bevacizumab 09/07/2020 ?Cycle 3 atezolizumab/bevacizumab 09/28/2020 ?Cycle 4 atezolizumab/bevacizumab 10/19/2020 ?MRI liver 11/07/2020-numerous hyperenhancing liver masses-stable to mildly improved compared to the MRI 07/29/2020 ?Cycle 5 atezolizumab/bevacizumab 11/10/2020 ?Cycle 6 atezolizumab/bevacizumab 12/04/2020 ?Cycle 7 atezolizumab/bevacizumab 12/26/2020 ?Cycle 8 atezolizumab/bevacizumab 01/16/2021 ?Cycle 9 atezolizumab/bevacizumab 02/06/2021 ?Cycle 10 atezolizumab/bevacizumab 02/26/2021 ?03/20/2021 MRI abdomen-right greater than left multifocal hepatocellular carcinoma, similar to exam 11/07/2020; new small right pleural effusion. ?Cycle 11 atezolizumab/bevacizumab 03/22/2021 ?Cycle 12 but he is less [name]/bevacizumab 04/12/2021 ?Coronary artery disease ?Atrial fibrillation-maintained on anticoagulation ?History of basal cell and squamous cell skin cancers ?Diabetes ?Anorexia/weight loss ?Abdominal pain ?Urinary tract infection 08/30/2020 ?  ? ? ?Disposition: ?Stacy [name] appears stable.  She continues to tolerate the treatment well.  There is no clinical evidence for progression of the hepatocellular carcinoma.  She will complete another treatment with atezolizumab/bevacizumab today.  She will return for an office visit and the next cycle of systemic therapy in 3 weeks. ? ?Stacy Coder, MD ? ?04/12/2021  ?11:59 AM ? ? ? ?

## 2021-04-24 ENCOUNTER — Other Ambulatory Visit: Payer: Self-pay

## 2021-04-24 ENCOUNTER — Encounter: Payer: Self-pay | Admitting: Nurse Practitioner

## 2021-04-24 ENCOUNTER — Inpatient Hospital Stay (HOSPITAL_BASED_OUTPATIENT_CLINIC_OR_DEPARTMENT_OTHER): Payer: Medicare Other | Admitting: Nurse Practitioner

## 2021-04-24 ENCOUNTER — Inpatient Hospital Stay: Payer: Medicare Other

## 2021-04-24 VITALS — BP 137/74 | HR 67 | Temp 98.1°F | Resp 18 | Ht 62.0 in | Wt 125.0 lb

## 2021-04-24 DIAGNOSIS — R748 Abnormal levels of other serum enzymes: Secondary | ICD-10-CM | POA: Diagnosis not present

## 2021-04-24 DIAGNOSIS — I4891 Unspecified atrial fibrillation: Secondary | ICD-10-CM | POA: Diagnosis not present

## 2021-04-24 DIAGNOSIS — E119 Type 2 diabetes mellitus without complications: Secondary | ICD-10-CM | POA: Diagnosis not present

## 2021-04-24 DIAGNOSIS — Z5111 Encounter for antineoplastic chemotherapy: Secondary | ICD-10-CM | POA: Diagnosis not present

## 2021-04-24 DIAGNOSIS — I2510 Atherosclerotic heart disease of native coronary artery without angina pectoris: Secondary | ICD-10-CM | POA: Diagnosis not present

## 2021-04-24 DIAGNOSIS — C220 Liver cell carcinoma: Secondary | ICD-10-CM | POA: Diagnosis not present

## 2021-04-24 DIAGNOSIS — C221 Intrahepatic bile duct carcinoma: Secondary | ICD-10-CM | POA: Diagnosis not present

## 2021-04-24 LAB — CMP (CANCER CENTER ONLY)
ALT: 914 U/L (ref 0–44)
AST: 351 U/L (ref 15–41)
Albumin: 3.6 g/dL (ref 3.5–5.0)
Alkaline Phosphatase: 212 U/L — ABNORMAL HIGH (ref 38–126)
Anion gap: 8 (ref 5–15)
BUN: 20 mg/dL (ref 8–23)
CO2: 29 mmol/L (ref 22–32)
Calcium: 9.3 mg/dL (ref 8.9–10.3)
Chloride: 101 mmol/L (ref 98–111)
Creatinine: 1.19 mg/dL — ABNORMAL HIGH (ref 0.44–1.00)
GFR, Estimated: 43 mL/min — ABNORMAL LOW (ref >60)
Glucose, Bld: 234 mg/dL — ABNORMAL HIGH (ref 70–99)
Potassium: 4.7 mmol/L (ref 3.5–5.1)
Sodium: 138 mmol/L (ref 135–145)
Total Bilirubin: 0.6 mg/dL (ref 0.3–1.2)
Total Protein: 6.4 g/dL — ABNORMAL LOW (ref 6.5–8.1)

## 2021-04-24 MED ORDER — PREDNISONE 10 MG PO TABS: ORAL_TABLET | ORAL | 0 refills | Status: DC

## 2021-04-24 NOTE — Progress Notes (Signed)
?  Stacy [name] ?OFFICE PROGRESS NOTE ? ? ?Diagnosis: Hepatocellular carcinoma ? ?INTERVAL HISTORY:  ? ?Stacy [name] returns as scheduled.  Treatment with atezolizumab/bevacizumab was held 04/12/2021 due to increased LFTs.  She began a course of prednisone 04/13/2021.  She feels well.  She has a good appetite and good energy level.  No pain. ? ?Objective: ? ?Vital signs in last 24 hours: ? ?Blood pressure 137/74, pulse 67, temperature 98.1 ?F (36.7 ?C), temperature source Oral, resp. rate 18, height '5\' 2"'$  (1.575 m), weight 125 lb (56.7 kg), SpO2 100 %. ?  ? ?HEENT: No thrush. ?Resp: Lungs clear bilaterally. ?Cardio: Regular rate and rhythm. ?GI: Abdomen soft and nontender.  No hepatosplenomegaly. ?Vascular: No leg edema. ? ? ?Lab Results: ? ?Lab Results  ?Component Value Date  ? WBC 7.6 04/12/2021  ? HGB 11.5 (L) 04/12/2021  ? HCT 39.3 04/12/2021  ? MCV 79.6 (L) 04/12/2021  ? PLT 191 04/12/2021  ? NEUTROABS 4.2 04/12/2021  ? ? ?Imaging: ? ?No results found. ? ?Medications: I have reviewed the patient's current medications. ? ?Assessment/Plan: ?Multiple liver masses ?CT abdomen/pelvis 07/11/2018, low-attenuation liver lesions incompletely characterized without contrast ?Ultrasound abdomen 07/10/2020-innumerable incompletely assessed hypoechoic masses in the liver ?MRI abdomen 07/29/2020- innumerable hypervascular liver masses, nodular liver contour, 1.5 cm left adrenal mass ?Biopsy liver lesion 08/08/2020-hepatocellular carcinoma, moderately differentiated ?Cycle 1 atezolizumab/bevacizumab 08/18/2020 ?Cycle 2 atezolizumab/bevacizumab 09/07/2020 ?Cycle 3 atezolizumab/bevacizumab 09/28/2020 ?Cycle 4 atezolizumab/bevacizumab 10/19/2020 ?MRI liver 11/07/2020-numerous hyperenhancing liver masses-stable to mildly improved compared to the MRI 07/29/2020 ?Cycle 5 atezolizumab/bevacizumab 11/10/2020 ?Cycle 6 atezolizumab/bevacizumab 12/04/2020 ?Cycle 7 atezolizumab/bevacizumab 12/26/2020 ?Cycle 8 atezolizumab/bevacizumab  01/16/2021 ?Cycle 9 atezolizumab/bevacizumab 02/06/2021 ?Cycle 10 atezolizumab/bevacizumab 02/26/2021 ?03/20/2021 MRI abdomen-right greater than left multifocal hepatocellular carcinoma, similar to exam 11/07/2020; new small right pleural effusion. ?Cycle 11 atezolizumab/bevacizumab 03/22/2021 ?Cycle 12 atezolizumab/bevacizumab 04/12/2021 HELD due to increased LFTs ?Coronary artery disease ?Atrial fibrillation-maintained on anticoagulation ?History of basal cell and squamous cell skin cancers ?Diabetes ?Anorexia/weight loss ?Abdominal pain ?Urinary tract infection 08/30/2020 ?  ? ?Disposition: Stacy [name] appears unchanged.  Cycle 12 atezolizumab/bevacizumab was held on 04/12/2021 due to increased LFTs.  She was started on a course of prednisone.  Review of the labs from today show AST is stable, ALT further elevated.  Plan to increase prednisone to 40 mg daily.  She will follow-up as scheduled 05/02/2021. ? ?Plan reviewed with Dr. Benay Spice. ? ? ? ?Ned Card ANP/GNP-BC  ? ?04/24/2021  ?1:15 PM ? ? ? ? ? ? ? ?

## 2021-04-26 ENCOUNTER — Telehealth: Payer: Self-pay

## 2021-04-26 NOTE — Telephone Encounter (Signed)
Called and left a message to the patient's daughter to let her know one of the liver function tests is stable, the other is higher. Recommend increasing prednisone to 40 mg daily.  A new prescription to her pharmacy. ?

## 2021-04-26 NOTE — Telephone Encounter (Signed)
-----   Message from Owens Shark, NP sent at 04/24/2021  2:47 PM EDT ----- ?Please let her daughter know one of the liver function tests is stable, the other is higher.  Recommend increasing prednisone to 40 mg daily.  I will send a new prescription to her pharmacy. ? ?

## 2021-05-01 ENCOUNTER — Other Ambulatory Visit: Payer: Self-pay | Admitting: *Deleted

## 2021-05-01 DIAGNOSIS — C220 Liver cell carcinoma: Secondary | ICD-10-CM

## 2021-05-01 NOTE — Progress Notes (Signed)
Lab orders

## 2021-05-02 ENCOUNTER — Inpatient Hospital Stay: Payer: Medicare Other

## 2021-05-02 ENCOUNTER — Inpatient Hospital Stay (HOSPITAL_BASED_OUTPATIENT_CLINIC_OR_DEPARTMENT_OTHER): Payer: Medicare Other | Admitting: Oncology

## 2021-05-02 ENCOUNTER — Encounter: Payer: Self-pay | Admitting: *Deleted

## 2021-05-02 ENCOUNTER — Other Ambulatory Visit: Payer: Self-pay | Admitting: Oncology

## 2021-05-02 ENCOUNTER — Other Ambulatory Visit: Payer: Self-pay

## 2021-05-02 ENCOUNTER — Encounter: Payer: Self-pay | Admitting: Oncology

## 2021-05-02 VITALS — BP 126/65 | HR 65 | Temp 97.8°F | Resp 18 | Ht 62.0 in | Wt 124.0 lb

## 2021-05-02 VITALS — BP 146/69 | HR 61

## 2021-05-02 DIAGNOSIS — R5383 Other fatigue: Secondary | ICD-10-CM

## 2021-05-02 DIAGNOSIS — C220 Liver cell carcinoma: Secondary | ICD-10-CM | POA: Diagnosis not present

## 2021-05-02 DIAGNOSIS — R748 Abnormal levels of other serum enzymes: Secondary | ICD-10-CM | POA: Diagnosis not present

## 2021-05-02 DIAGNOSIS — I4891 Unspecified atrial fibrillation: Secondary | ICD-10-CM | POA: Diagnosis not present

## 2021-05-02 DIAGNOSIS — C221 Intrahepatic bile duct carcinoma: Secondary | ICD-10-CM | POA: Diagnosis not present

## 2021-05-02 DIAGNOSIS — Z5111 Encounter for antineoplastic chemotherapy: Secondary | ICD-10-CM | POA: Diagnosis not present

## 2021-05-02 DIAGNOSIS — E119 Type 2 diabetes mellitus without complications: Secondary | ICD-10-CM | POA: Diagnosis not present

## 2021-05-02 DIAGNOSIS — I2510 Atherosclerotic heart disease of native coronary artery without angina pectoris: Secondary | ICD-10-CM | POA: Diagnosis not present

## 2021-05-02 LAB — CMP (CANCER CENTER ONLY)
ALT: 1001 U/L (ref 0–44)
AST: 309 U/L (ref 15–41)
Albumin: 3.8 g/dL (ref 3.5–5.0)
Alkaline Phosphatase: 186 U/L — ABNORMAL HIGH (ref 38–126)
Anion gap: 9 (ref 5–15)
BUN: 30 mg/dL — ABNORMAL HIGH (ref 8–23)
CO2: 29 mmol/L (ref 22–32)
Calcium: 9.3 mg/dL (ref 8.9–10.3)
Chloride: 101 mmol/L (ref 98–111)
Creatinine: 1.41 mg/dL — ABNORMAL HIGH (ref 0.44–1.00)
GFR, Estimated: 35 mL/min — ABNORMAL LOW (ref >60)
Glucose, Bld: 220 mg/dL — ABNORMAL HIGH (ref 70–99)
Potassium: 4.3 mmol/L (ref 3.5–5.1)
Sodium: 139 mmol/L (ref 135–145)
Total Bilirubin: 0.8 mg/dL (ref 0.3–1.2)
Total Protein: 6.8 g/dL (ref 6.5–8.1)

## 2021-05-02 LAB — CBC WITH DIFFERENTIAL (CANCER CENTER ONLY)
Abs Immature Granulocytes: 0.03 10*3/uL (ref 0.00–0.07)
Basophils Absolute: 0 10*3/uL (ref 0.0–0.1)
Basophils Relative: 0 %
Eosinophils Absolute: 0.2 10*3/uL (ref 0.0–0.5)
Eosinophils Relative: 1 %
HCT: 41.4 % (ref 36.0–46.0)
Hemoglobin: 12.5 g/dL (ref 12.0–15.0)
Immature Granulocytes: 0 %
Lymphocytes Relative: 33 %
Lymphs Abs: 3.8 10*3/uL (ref 0.7–4.0)
MCH: 24 pg — ABNORMAL LOW (ref 26.0–34.0)
MCHC: 30.2 g/dL (ref 30.0–36.0)
MCV: 79.6 fL — ABNORMAL LOW (ref 80.0–100.0)
Monocytes Absolute: 0.7 10*3/uL (ref 0.1–1.0)
Monocytes Relative: 6 %
Neutro Abs: 6.9 10*3/uL (ref 1.7–7.7)
Neutrophils Relative %: 60 %
Platelet Count: 202 10*3/uL (ref 150–400)
RBC: 5.2 MIL/uL — ABNORMAL HIGH (ref 3.87–5.11)
RDW: 20.8 % — ABNORMAL HIGH (ref 11.5–15.5)
WBC Count: 11.5 10*3/uL — ABNORMAL HIGH (ref 4.0–10.5)
nRBC: 0 % (ref 0.0–0.2)

## 2021-05-02 LAB — TSH: TSH: 2.723 u[IU]/mL (ref 0.350–4.500)

## 2021-05-02 MED ORDER — SODIUM CHLORIDE 0.9 % IV SOLN: Freq: Once | INTRAVENOUS | Status: AC

## 2021-05-02 MED ORDER — SODIUM CHLORIDE 0.9 % IV SOLN
15.0000 mg/kg | Freq: Once | INTRAVENOUS | Status: AC
  Administered 2021-05-02: 900 mg via INTRAVENOUS
  Filled 2021-05-02: qty 32

## 2021-05-02 NOTE — Progress Notes (Signed)
?  [facility] ?OFFICE PROGRESS NOTE ? ? ?Diagnosis: Hepatocellular carcinoma ? ?INTERVAL HISTORY:  ? ?Stacy [name] returns as scheduled.  She continues prednisone at a dose of 40 mg daily.  She has insomnia, relieved with Benadryl.  No other complaint.  No abdominal pain or nausea.  Good appetite.  Get energy level.  She has an occasional episode of diarrhea. ? ?Objective: ? ?Vital signs in last 24 hours: ? ?Blood pressure 126/65, pulse 65, temperature 97.8 ?F (36.6 ?C), temperature source Oral, resp. rate 18, height '5\' 2"'$  (1.575 m), weight 124 lb (56.2 kg), SpO2 99 %. ?  ? ?HEENT: No thrush ?Resp: Lungs clear bilaterally ?Cardio: Irregular ?GI: Nontender, no hepatosplenomegaly ?Vascular: No leg edema ?  ? ? ?Lab Results: ? ?Lab Results  ?Component Value Date  ? WBC 11.5 (H) 05/02/2021  ? HGB 12.5 05/02/2021  ? HCT 41.4 05/02/2021  ? MCV 79.6 (L) 05/02/2021  ? PLT 202 05/02/2021  ? NEUTROABS 6.9 05/02/2021  ? ? ?CMP  ?Lab Results  ?Component Value Date  ? NA 138 04/24/2021  ? K 4.7 04/24/2021  ? CL 101 04/24/2021  ? CO2 29 04/24/2021  ? GLUCOSE 234 (H) 04/24/2021  ? BUN 20 04/24/2021  ? CREATININE 1.19 (H) 04/24/2021  ? CALCIUM 9.3 04/24/2021  ? PROT 6.4 (L) 04/24/2021  ? ALBUMIN 3.6 04/24/2021  ? AST 351 (HH) 04/24/2021  ? ALT 914 (HH) 04/24/2021  ? ALKPHOS 212 (H) 04/24/2021  ? BILITOT 0.6 04/24/2021  ? GFRNONAA 43 (L) 04/24/2021  ? GFRAA 54 (L) 02/22/2020  ? ? ?Lab Results  ?Component Value Date  ? CEA 2.45 08/02/2020  ? [id number] 27 08/02/2020  ? ? ?No results found. ? ?Medications: I have reviewed the patient's current medications. ? ? ?Assessment/Plan: ?Multiple liver masses ?CT abdomen/pelvis 07/11/2018, low-attenuation liver lesions incompletely characterized without contrast ?Ultrasound abdomen 07/10/2020-innumerable incompletely assessed hypoechoic masses in the liver ?MRI abdomen 07/29/2020- innumerable hypervascular liver masses, nodular liver contour, 1.5 cm left adrenal mass ?Biopsy liver lesion  08/08/2020-hepatocellular carcinoma, moderately differentiated ?Cycle 1 atezolizumab/bevacizumab 08/18/2020 ?Cycle 2 atezolizumab/bevacizumab 09/07/2020 ?Cycle 3 atezolizumab/bevacizumab 09/28/2020 ?Cycle 4 atezolizumab/bevacizumab 10/19/2020 ?MRI liver 11/07/2020-numerous hyperenhancing liver masses-stable to mildly improved compared to the MRI 07/29/2020 ?Cycle 5 atezolizumab/bevacizumab 11/10/2020 ?Cycle 6 atezolizumab/bevacizumab 12/04/2020 ?Cycle 7 atezolizumab/bevacizumab 12/26/2020 ?Cycle 8 atezolizumab/bevacizumab 01/16/2021 ?Cycle 9 atezolizumab/bevacizumab 02/06/2021 ?Cycle 10 atezolizumab/bevacizumab 02/26/2021 ?03/20/2021 MRI abdomen-right greater than left multifocal hepatocellular carcinoma, similar to exam 11/07/2020; new small right pleural effusion. ?Cycle 11 atezolizumab/bevacizumab 03/22/2021 ?Cycle 12 atezolizumab/bevacizumab 04/12/2021 HELD due to increased LFTs ?Cycle 13 bevacizumab 05/02/2021, atezolizumab held secondary to elevated liver enzymes ?Coronary artery disease ?Atrial fibrillation-maintained on anticoagulation ?History of basal cell and squamous cell skin cancers ?Diabetes ?Anorexia/weight loss ?Abdominal pain ?Urinary tract infection 08/30/2020 ?  ? ? ? ?Disposition: ?Stacy [name] appears unchanged.  The liver enzymes remain elevated.  Atezolizumab will remain on hold.  She will continue prednisone at a dose of 40 mg daily.  She will return for a liver panel in 10 days. ?I suspect the elevated liver enzymes are related to a combination of hepatocellular carcinoma involving the liver and possibly toxicity from atezolizumab. ? ?She will complete a treatment with bevacizumab today. ? ?Stacy [name] will return for an office visit as scheduled on 05/23/2021.  We will taper the prednisone if the liver enzymes improve ? ?[name], MD ? ?05/02/2021  ?10:23 AM ? ? ?

## 2021-05-02 NOTE — Patient Instructions (Signed)
[facility]  Discharge Instructions: ?Thank you for choosing [facility] to provide your oncology and hematology care.  ? ?If you have a lab appointment with the [facility], please go directly to the [facility] and check in at the registration area. ?  ?Wear comfortable clothing and clothing appropriate for easy access to any Portacath or PICC line.  ? ?We strive to give you quality time with your provider. You may need to reschedule your appointment if you arrive late (15 or more minutes).  Arriving late affects you and other patients whose appointments are after yours.  Also, if you miss three or more appointments without notifying the office, you may be dismissed from the clinic at the provider?s discretion.    ?  ?For prescription refill requests, have your pharmacy contact our office and allow 72 hours for refills to be completed.   ? ?Today you received the following chemotherapy and/or immunotherapy agents: bevacizumab  ? ?Bevacizumab injection ?What is this medication? ?BEVACIZUMAB (be va SIZ yoo mab) is a monoclonal antibody. It is used to treat many types of cancer. ?This medicine may be used for other purposes; ask your health care provider or pharmacist if you have questions. ?COMMON BRAND NAME(S): Alymsys, Avastin, MVASI, Zirabev ?What should I tell my care team before I take this medication? ?They need to know if you have any of these conditions: ?diabetes ?heart disease ?high blood pressure ?history of coughing up blood ?prior anthracycline chemotherapy (e.g., doxorubicin, daunorubicin, epirubicin) ?recent or ongoing radiation therapy ?recent or planning to have surgery ?stroke ?an unusual or allergic reaction to bevacizumab, hamster proteins, mouse proteins, other medicines, foods, dyes, or preservatives ?pregnant or trying to get pregnant ?breast-feeding ?How should I use this medication? ?This medicine is for infusion into a vein. It is given by a  health care professional in a hospital or clinic setting. ?Talk to your pediatrician regarding the use of this medicine in children. Special care may be needed. ?Overdosage: If you think you have taken too much of this medicine contact a poison control center or emergency room at once. ?NOTE: This medicine is only for you. Do not share this medicine with others. ?What if I miss a dose? ?It is important not to miss your dose. Call your doctor or health care professional if you are unable to keep an appointment. ?What may interact with this medication? ?Interactions are not expected. ?This list may not describe all possible interactions. Give your health care provider a list of all the medicines, herbs, non-prescription drugs, or dietary supplements you use. Also tell them if you smoke, drink alcohol, or use illegal drugs. Some items may interact with your medicine. ?What should I watch for while using this medication? ?Your condition will be monitored carefully while you are receiving this medicine. You will need important blood work and urine testing done while you are taking this medicine. ?This medicine may increase your risk to bruise or bleed. Call your doctor or health care professional if you notice any unusual bleeding. ?Before having surgery, talk to your health care provider to make sure it is ok. This drug can increase the risk of poor healing of your surgical site or wound. You will need to stop this drug for 28 days before surgery. After surgery, wait at least 28 days before restarting this drug. Make sure the surgical site or wound is healed enough before restarting this drug. Talk to your health care provider if questions. ?  Do not become pregnant while taking this medicine or for 6 months after stopping it. Women should inform their doctor if they wish to become pregnant or think they might be pregnant. There is a potential for serious side effects to an unborn child. Talk to your health care  professional or pharmacist for more information. Do not breast-feed an infant while taking this medicine and for 6 months after the last dose. ?This medicine has caused ovarian failure in some women. This medicine may interfere with the ability to have a child. You should talk to your doctor or health care professional if you are concerned about your fertility. ?What side effects may I notice from receiving this medication? ?Side effects that you should report to your doctor or health care professional as soon as possible: ?allergic reactions like skin rash, itching or hives, swelling of the face, lips, or tongue ?chest pain or chest tightness ?chills ?coughing up blood ?high fever ?seizures ?severe constipation ?signs and symptoms of bleeding such as bloody or black, tarry stools; red or dark-brown urine; spitting up blood or brown material that looks like coffee grounds; red spots on the skin; unusual bruising or bleeding from the eye, gums, or nose ?signs and symptoms of a blood clot such as breathing problems; chest pain; severe, sudden headache; pain, swelling, warmth in the leg ?signs and symptoms of a stroke like changes in vision; confusion; trouble speaking or understanding; severe headaches; sudden numbness or weakness of the face, arm or leg; trouble walking; dizziness; loss of balance or coordination ?stomach pain ?sweating ?swelling of legs or ankles ?vomiting ?weight gain ?Side effects that usually do not require medical attention (report to your doctor or health care professional if they continue or are bothersome): ?back pain ?changes in taste ?decreased appetite ?dry skin ?nausea ?tiredness ?This list may not describe all possible side effects. Call your doctor for medical advice about side effects. You may report side effects to FDA at 1-800-FDA-1088. ?Where should I keep my medication? ?This drug is given in a hospital or clinic and will not be stored at home. ?NOTE: This sheet is a summary. It  may not cover all possible information. If you have questions about this medicine, talk to your doctor, pharmacist, or health care provider. ?? 2022 Elsevier/Gold Standard (2020-10-17 00:00:00) ?    ?  ?To help prevent nausea and vomiting after your treatment, we encourage you to take your nausea medication as directed. ? ?BELOW ARE SYMPTOMS THAT SHOULD BE REPORTED IMMEDIATELY: ?*FEVER GREATER THAN 100.4 F (38 ?C) OR HIGHER ?*CHILLS OR SWEATING ?*NAUSEA AND VOMITING THAT IS NOT CONTROLLED WITH YOUR NAUSEA MEDICATION ?*UNUSUAL SHORTNESS OF BREATH ?*UNUSUAL BRUISING OR BLEEDING ?*URINARY PROBLEMS (pain or burning when urinating, or frequent urination) ?*BOWEL PROBLEMS (unusual diarrhea, constipation, pain near the anus) ?TENDERNESS IN MOUTH AND THROAT WITH OR WITHOUT PRESENCE OF ULCERS (sore throat, sores in mouth, or a toothache) ?UNUSUAL RASH, SWELLING OR PAIN  ?UNUSUAL VAGINAL DISCHARGE OR ITCHING  ? ?Items with * indicate a potential emergency and should be followed up as soon as possible or go to the Emergency Department if any problems should occur. ? ?Please show the CHEMOTHERAPY ALERT CARD or IMMUNOTHERAPY ALERT CARD at check-in to the Emergency Department and triage nurse. ? ?Should you have questions after your visit or need to cancel or reschedule your appointment, please contact [facility]  Dept: [phone]  and follow the prompts.  Office hours are 8:00 a.m. to 4:30 p.m. Monday -  Friday. Please note that voicemails left after 4:00 p.m. may not be returned until the following business day.  We are closed weekends and major holidays. You have access to a nurse at all times for urgent questions. Please call the main number to the clinic Dept: [phone] and follow the prompts. ? ? ?For any non-urgent questions, you may also contact your provider using MyChart. We now offer e-Visits for anyone 75 and older to request care online for non-urgent symptoms. For details visit  [email or web address]. ?  ?Also download the MyChart app! Go to the app store, search "MyChart", open the app, select [facility], and log in with your MyChart username and password. ? ?Due to Covid, a mask is required

## 2021-05-02 NOTE — Progress Notes (Signed)
Patient presents for treatment. RN assessment completed along with the following: ? ?Labs/vitals reviewed - Yes, and Per Dr. Benay Spice, ok to proceed with elevated liver enzymes.    ?Weight within 10% of previous measurement - Yes ?Informed consent completed and reflects current therapy/intent - Yes, on date 08/18/20             ?Provider progress note reviewed - Yes, today's provider note was reviewed. ?Treatment/Antibody/Supportive plan reviewed - Yes, and Per Dr. Benay Spice, pt will receive bevacizumab only today.  ?S&H and other orders reviewed - Yes, and there are no additional orders identified. ?Previous treatment date reviewed - Yes, and the appropriate amount of time has elapsed between treatments. ?Clinic Hand Off Received from - Merceda Elks, RN ? ?Patient to proceed with treatment.   ?

## 2021-05-02 NOTE — Progress Notes (Signed)
CRITICAL VALUE STICKER ? ?CRITICAL VALUE: ALT-1001 and AST 309 ? ?RECEIVER (on-site recipient of call):Guenther Dunshee,RN ? ?DATE & TIME NOTIFIED: 05/02/21 @ 1044 ? ?MESSENGER (representative from lab):Simona Huh ? ?MD NOTIFIED: Dr. Benay Spice ? ?TIME OF NOTIFICATION: 1051 ? ?RESPONSE: Stable--hold atezolizumab today ? ? ?Patient seen by Dr. Benay Spice today ? ?Vitals are within treatment parameters. ? ?Labs reviewed by Dr. Benay Spice and are not all within treatment parameters. Elevated liver functions-- OK to treat ? ?Per physician team, patient is ready for treatment. Please note that modifications are being made to the treatment plan including Will only receive Avastin today.   ?

## 2021-05-03 ENCOUNTER — Inpatient Hospital Stay: Payer: Medicare Other

## 2021-05-03 ENCOUNTER — Inpatient Hospital Stay: Payer: Medicare Other | Admitting: Oncology

## 2021-05-11 ENCOUNTER — Inpatient Hospital Stay: Payer: Medicare Other

## 2021-05-11 ENCOUNTER — Telehealth: Payer: Self-pay | Admitting: *Deleted

## 2021-05-11 DIAGNOSIS — R748 Abnormal levels of other serum enzymes: Secondary | ICD-10-CM | POA: Diagnosis not present

## 2021-05-11 DIAGNOSIS — C220 Liver cell carcinoma: Secondary | ICD-10-CM

## 2021-05-11 DIAGNOSIS — Z5111 Encounter for antineoplastic chemotherapy: Secondary | ICD-10-CM | POA: Diagnosis not present

## 2021-05-11 DIAGNOSIS — C221 Intrahepatic bile duct carcinoma: Secondary | ICD-10-CM | POA: Diagnosis not present

## 2021-05-11 DIAGNOSIS — I2510 Atherosclerotic heart disease of native coronary artery without angina pectoris: Secondary | ICD-10-CM | POA: Diagnosis not present

## 2021-05-11 DIAGNOSIS — I4891 Unspecified atrial fibrillation: Secondary | ICD-10-CM | POA: Diagnosis not present

## 2021-05-11 DIAGNOSIS — E119 Type 2 diabetes mellitus without complications: Secondary | ICD-10-CM | POA: Diagnosis not present

## 2021-05-11 LAB — CMP (CANCER CENTER ONLY)
ALT: 781 U/L (ref 0–44)
AST: 195 U/L (ref 15–41)
Albumin: 3.5 g/dL (ref 3.5–5.0)
Alkaline Phosphatase: 177 U/L — ABNORMAL HIGH (ref 38–126)
Anion gap: 9 (ref 5–15)
BUN: 25 mg/dL — ABNORMAL HIGH (ref 8–23)
CO2: 28 mmol/L (ref 22–32)
Calcium: 8.8 mg/dL — ABNORMAL LOW (ref 8.9–10.3)
Chloride: 101 mmol/L (ref 98–111)
Creatinine: 1.32 mg/dL — ABNORMAL HIGH (ref 0.44–1.00)
GFR, Estimated: 38 mL/min — ABNORMAL LOW (ref >60)
Glucose, Bld: 196 mg/dL — ABNORMAL HIGH (ref 70–99)
Potassium: 4 mmol/L (ref 3.5–5.1)
Sodium: 138 mmol/L (ref 135–145)
Total Bilirubin: 0.7 mg/dL (ref 0.3–1.2)
Total Protein: 6.2 g/dL — ABNORMAL LOW (ref 6.5–8.1)

## 2021-05-11 NOTE — Telephone Encounter (Signed)
CRITICAL VALUE STICKER ? ?CRITICAL VALUE:AST-195 and ALT-781 ? ?RECEIVER (on-site recipient of call):Starlina Lapre,RN ? ?DATE & TIME NOTIFIED: 05/11/21  ? ?MESSENGER (representative from lab):Erin ? ?MD NOTIFIED: Dr. Benay Spice ? ?TIME OF NOTIFICATION:1040 ? ?RESPONSE: Improved. Will decrease Prednisone to 30 mg/day ? ?

## 2021-05-11 NOTE — Telephone Encounter (Signed)
Notified daughter, Rodena Piety of improvement in AST/ALT and MD wants her to decrease Prednisone to 30 mg daily and follow up as scheduled. She understands and agrees. ?

## 2021-05-20 ENCOUNTER — Other Ambulatory Visit: Payer: Self-pay | Admitting: Oncology

## 2021-05-23 ENCOUNTER — Telehealth: Payer: Self-pay | Admitting: *Deleted

## 2021-05-23 ENCOUNTER — Encounter: Payer: Self-pay | Admitting: *Deleted

## 2021-05-23 ENCOUNTER — Inpatient Hospital Stay (HOSPITAL_BASED_OUTPATIENT_CLINIC_OR_DEPARTMENT_OTHER): Payer: Medicare Other | Admitting: Oncology

## 2021-05-23 ENCOUNTER — Inpatient Hospital Stay: Payer: Medicare Other

## 2021-05-23 ENCOUNTER — Inpatient Hospital Stay: Payer: Medicare Other | Attending: Nurse Practitioner

## 2021-05-23 VITALS — BP 129/72 | HR 114 | Temp 98.1°F | Resp 18 | Ht 62.0 in | Wt 120.8 lb

## 2021-05-23 VITALS — BP 120/98 | HR 95

## 2021-05-23 DIAGNOSIS — Z87440 Personal history of urinary (tract) infections: Secondary | ICD-10-CM | POA: Insufficient documentation

## 2021-05-23 DIAGNOSIS — Z5112 Encounter for antineoplastic immunotherapy: Secondary | ICD-10-CM | POA: Diagnosis not present

## 2021-05-23 DIAGNOSIS — R630 Anorexia: Secondary | ICD-10-CM | POA: Insufficient documentation

## 2021-05-23 DIAGNOSIS — E119 Type 2 diabetes mellitus without complications: Secondary | ICD-10-CM | POA: Insufficient documentation

## 2021-05-23 DIAGNOSIS — R634 Abnormal weight loss: Secondary | ICD-10-CM | POA: Diagnosis not present

## 2021-05-23 DIAGNOSIS — C220 Liver cell carcinoma: Secondary | ICD-10-CM | POA: Diagnosis not present

## 2021-05-23 DIAGNOSIS — Z85828 Personal history of other malignant neoplasm of skin: Secondary | ICD-10-CM | POA: Insufficient documentation

## 2021-05-23 DIAGNOSIS — R809 Proteinuria, unspecified: Secondary | ICD-10-CM | POA: Insufficient documentation

## 2021-05-23 DIAGNOSIS — N390 Urinary tract infection, site not specified: Secondary | ICD-10-CM

## 2021-05-23 DIAGNOSIS — I4891 Unspecified atrial fibrillation: Secondary | ICD-10-CM | POA: Insufficient documentation

## 2021-05-23 DIAGNOSIS — I2510 Atherosclerotic heart disease of native coronary artery without angina pectoris: Secondary | ICD-10-CM | POA: Diagnosis not present

## 2021-05-23 DIAGNOSIS — R300 Dysuria: Secondary | ICD-10-CM | POA: Insufficient documentation

## 2021-05-23 DIAGNOSIS — B370 Candidal stomatitis: Secondary | ICD-10-CM | POA: Insufficient documentation

## 2021-05-23 LAB — URINALYSIS, COMPLETE (UACMP) WITH MICROSCOPIC
Glucose, UA: NEGATIVE mg/dL
Ketones, ur: NEGATIVE mg/dL
Nitrite: NEGATIVE
Protein, ur: 100 mg/dL — AB
Specific Gravity, Urine: 1.02 (ref 1.005–1.030)
WBC, UA: >50 WBC/hpf (ref 0–5)
pH: 5.5 (ref 5.0–8.0)

## 2021-05-23 LAB — CMP (CANCER CENTER ONLY)
ALT: 551 U/L (ref 0–44)
AST: 149 U/L — ABNORMAL HIGH (ref 15–41)
Albumin: 3.7 g/dL (ref 3.5–5.0)
Alkaline Phosphatase: 168 U/L — ABNORMAL HIGH (ref 38–126)
Anion gap: 15 (ref 5–15)
BUN: 22 mg/dL (ref 8–23)
CO2: 21 mmol/L — ABNORMAL LOW (ref 22–32)
Calcium: 9 mg/dL (ref 8.9–10.3)
Chloride: 104 mmol/L (ref 98–111)
Creatinine: 1.58 mg/dL — ABNORMAL HIGH (ref 0.44–1.00)
GFR, Estimated: 30 mL/min — ABNORMAL LOW (ref >60)
Glucose, Bld: 214 mg/dL — ABNORMAL HIGH (ref 70–99)
Potassium: 3.5 mmol/L (ref 3.5–5.1)
Sodium: 140 mmol/L (ref 135–145)
Total Bilirubin: 0.9 mg/dL (ref 0.3–1.2)
Total Protein: 6.6 g/dL (ref 6.5–8.1)

## 2021-05-23 LAB — TOTAL PROTEIN, URINE DIPSTICK: Protein, ur: 100 mg/dL — AB

## 2021-05-23 MED ORDER — SODIUM CHLORIDE 0.9 % IV SOLN: Freq: Once | INTRAVENOUS | Status: AC

## 2021-05-23 MED ORDER — CIPROFLOXACIN HCL 250 MG PO TABS: 250.0000 mg | ORAL_TABLET | Freq: Two times a day (BID) | ORAL | 0 refills | Status: DC

## 2021-05-23 MED ORDER — SODIUM CHLORIDE 0.9 % IV SOLN
15.0000 mg/kg | Freq: Once | INTRAVENOUS | Status: AC
  Administered 2021-05-23: 900 mg via INTRAVENOUS
  Filled 2021-05-23: qty 32

## 2021-05-23 MED ORDER — PREDNISONE 10 MG PO TABS: 20.0000 mg | ORAL_TABLET | Freq: Every day | ORAL | 0 refills | Status: DC

## 2021-05-23 MED ORDER — MAGIC MOUTHWASH
5.0000 mL | Freq: Four times a day (QID) | ORAL | 1 refills | Status: DC | PRN
Start: 2021-05-23 — End: 2021-06-12

## 2021-05-23 NOTE — Telephone Encounter (Signed)
Urinalysis highly suggestive of UTI. Added order for culture and lab notified. Per Dr. Benay Spice: Cipro 250 mg bid x 5 days. Script sent and daughter notified via VM and Mychart. ?

## 2021-05-23 NOTE — Progress Notes (Signed)
Patient presents for treatment. RN assessment completed along with the following: ? ?Labs/vitals reviewed - Vitals are not all within treatment parameters. Pulse 110-114 (irregular) ?  ?Labs reviewed by Dr. Benay Spice and are not all within treatment parameters. Creatinine 1.5, urine protein=100 ; AST 149; ALT 551 ?*OK to treat w/Avastin today. Will collect full U/A today to check for UTI*   ?Weight within 10% of previous measurement - Yes ?Informed consent completed and reflects current therapy/intent - Yes, on date 08/18/20             ?Provider progress note reviewed - Today's provider note is not yet available. I reviewed the most recent oncology provider progress note in chart dated 05/02/21. ?Treatment/Antibody/Supportive plan reviewed -  Will receive Avastin only today. ?S&H and other orders reviewed - Yes, and there are no additional orders identified. ?Previous treatment date reviewed - Yes, and the appropriate amount of time has elapsed between treatments. ?Clinic Hand Off Received from - Cristy Friedlander, RN ? ?Patient to proceed with treatment.  ? ?

## 2021-05-23 NOTE — Progress Notes (Signed)
?[facility] ?OFFICE PROGRESS NOTE ? ? ?Diagnosis: Hepatocellular carcinoma ? ?INTERVAL HISTORY:  ? ?Stacy [name] returns as scheduled.  She is here with her daughter.  She had mild dysuria this week.  She completed a cycle of bevacizumab on 05/02/2021.  Mild mouth soreness.  No bleeding.  Good appetite.  She continues prednisone at a dose of 40 mg daily. ? ?Objective: ? ?Vital signs in last 24 hours: ? ?Blood pressure 129/72, pulse (!) 114, temperature 98.1 ?F (36.7 ?C), temperature source Oral, resp. rate 18, height '5\' 2"'$  (1.575 m), weight 120 lb 12.8 oz (54.8 kg), SpO2 98 %. ?  ? ?HEENT: Mild thrush at the buccal mucosa, gum loss at 1 central lower tooth ?Resp: Lungs clear bilaterally ?Cardio: Irregular ?GI: Nontender, no hepatosplenomegaly ?Vascular: No leg edema  ?Skin: Rash ? ?Lab Results: ? ?Lab Results  ?Component Value Date  ? WBC 11.5 (H) 05/02/2021  ? HGB 12.5 05/02/2021  ? HCT 41.4 05/02/2021  ? MCV 79.6 (L) 05/02/2021  ? PLT 202 05/02/2021  ? NEUTROABS 6.9 05/02/2021  ? ? ?CMP  ?Lab Results  ?Component Value Date  ? NA 140 05/23/2021  ? K 3.5 05/23/2021  ? CL 104 05/23/2021  ? CO2 21 (L) 05/23/2021  ? GLUCOSE 214 (H) 05/23/2021  ? BUN 22 05/23/2021  ? CREATININE 1.58 (H) 05/23/2021  ? CALCIUM 9.0 05/23/2021  ? PROT 6.6 05/23/2021  ? ALBUMIN 3.7 05/23/2021  ? AST 149 (H) 05/23/2021  ? ALT 551 (HH) 05/23/2021  ? ALKPHOS 168 (H) 05/23/2021  ? BILITOT 0.9 05/23/2021  ? GFRNONAA 30 (L) 05/23/2021  ? GFRAA 54 (L) 02/22/2020  ? ? ?Lab Results  ?Component Value Date  ? CEA 2.45 08/02/2020  ? [id number] 27 08/02/2020  ? ? ?Lab Results  ?Component Value Date  ? INR 1.0 08/08/2020  ? LABPROT 12.9 08/08/2020  ? ? ?Imaging: ? ?No results found. ? ?Medications: I have reviewed the patient's current medications. ? ? ?Assessment/Plan: ?Multiple liver masses ?CT abdomen/pelvis 07/11/2018, low-attenuation liver lesions incompletely characterized without contrast ?Ultrasound abdomen 07/10/2020-innumerable  incompletely assessed hypoechoic masses in the liver ?MRI abdomen 07/29/2020- innumerable hypervascular liver masses, nodular liver contour, 1.5 cm left adrenal mass ?Biopsy liver lesion 08/08/2020-hepatocellular carcinoma, moderately differentiated ?Cycle 1 atezolizumab/bevacizumab 08/18/2020 ?Cycle 2 atezolizumab/bevacizumab 09/07/2020 ?Cycle 3 atezolizumab/bevacizumab 09/28/2020 ?Cycle 4 atezolizumab/bevacizumab 10/19/2020 ?MRI liver 11/07/2020-numerous hyperenhancing liver masses-stable to mildly improved compared to the MRI 07/29/2020 ?Cycle 5 atezolizumab/bevacizumab 11/10/2020 ?Cycle 6 atezolizumab/bevacizumab 12/04/2020 ?Cycle 7 atezolizumab/bevacizumab 12/26/2020 ?Cycle 8 atezolizumab/bevacizumab 01/16/2021 ?Cycle 9 atezolizumab/bevacizumab 02/06/2021 ?Cycle 10 atezolizumab/bevacizumab 02/26/2021 ?03/20/2021 MRI abdomen-right greater than left multifocal hepatocellular carcinoma, similar to exam 11/07/2020; new small right pleural effusion. ?Cycle 11 atezolizumab/bevacizumab 03/22/2021 ?Cycle 12 atezolizumab/bevacizumab 04/12/2021 HELD due to increased LFTs ?Cycle 13 bevacizumab 05/02/2021, atezolizumab held secondary to elevated liver enzymes ?Cycle 14 bevacizumab 05/23/2021, atezolizumab held secondary to elevated liver enzymes ?Coronary artery disease ?Atrial fibrillation-maintained on anticoagulation ?History of basal cell and squamous cell skin cancers ?Diabetes ?Anorexia/weight loss ?Abdominal pain ?Urinary tract infection 08/30/2020 ?  ? ? ? ? ?Disposition: ?[name] appears well.  The liver enzymes have improved.  The prednisone will be tapered to 20 mg daily.  The plan is to resume atezolizumab if there is further improvement in 3 weeks.  She will begin Magic mouthwash with a mild oral candidiasis.  We will check a urinalysis today.  She has mild proteinuria on the urine dipstick today. ? ?She will return for an office visit and treatment in  3 weeks.  We will plan for a restaging MRI in June. ? ?Stacy Coder,  MD ? ?05/23/2021  ?1:03 PM ? ? ?

## 2021-05-23 NOTE — Progress Notes (Signed)
Patient seen by Dr. Benay Spice today ? ?Vitals are not all within treatment parameters. Pulse 110-114 (irregular) ? ?Labs reviewed by Dr. Benay Spice and are not all within treatment parameters. Creatinine 1.5, urine protein=100 ; AST 149; ALT 551 ?*OK to treat w/Avastin today. Will collect full U/A today to check for UTI* ? ?Per physician team, patient is ready for treatment. Please note that modifications are being made to the treatment plan including Will receive Avastin only today. ?  ?

## 2021-05-23 NOTE — Patient Instructions (Signed)
[facility]  ? Discharge Instructions: ?Thank you for choosing [facility] to provide your oncology and hematology care.  ? ?If you have a lab appointment with the [facility], please go directly to the [facility] and check in at the registration area. ?  ?Wear comfortable clothing and clothing appropriate for easy access to any Portacath or PICC line.  ? ?We strive to give you quality time with your provider. You may need to reschedule your appointment if you arrive late (15 or more minutes).  Arriving late affects you and other patients whose appointments are after yours.  Also, if you miss three or more appointments without notifying the office, you may be dismissed from the clinic at the provider?s discretion.    ?  ?For prescription refill requests, have your pharmacy contact our office and allow 72 hours for refills to be completed.   ? ?Today you received the following chemotherapy and/or immunotherapy agents Bevacizumab-awwb    ?  ?To help prevent nausea and vomiting after your treatment, we encourage you to take your nausea medication as directed. ? ?BELOW ARE SYMPTOMS THAT SHOULD BE REPORTED IMMEDIATELY: ?*FEVER GREATER THAN 100.4 F (38 ?C) OR HIGHER ?*CHILLS OR SWEATING ?*NAUSEA AND VOMITING THAT IS NOT CONTROLLED WITH YOUR NAUSEA MEDICATION ?*UNUSUAL SHORTNESS OF BREATH ?*UNUSUAL BRUISING OR BLEEDING ?*URINARY PROBLEMS (pain or burning when urinating, or frequent urination) ?*BOWEL PROBLEMS (unusual diarrhea, constipation, pain near the anus) ?TENDERNESS IN MOUTH AND THROAT WITH OR WITHOUT PRESENCE OF ULCERS (sore throat, sores in mouth, or a toothache) ?UNUSUAL RASH, SWELLING OR PAIN  ?UNUSUAL VAGINAL DISCHARGE OR ITCHING  ? ?Items with * indicate a potential emergency and should be followed up as soon as possible or go to the Emergency Department if any problems should occur. ? ?Please show the CHEMOTHERAPY ALERT CARD or IMMUNOTHERAPY ALERT CARD at  check-in to the Emergency Department and triage nurse. ? ?Should you have questions after your visit or need to cancel or reschedule your appointment, please contact [facility]  Dept: [phone]  and follow the prompts.  Office hours are 8:00 a.m. to 4:30 p.m. Monday - Friday. Please note that voicemails left after 4:00 p.m. may not be returned until the following business day.  We are closed weekends and major holidays. You have access to a nurse at all times for urgent questions. Please call the main number to the clinic Dept: [phone] and follow the prompts. ? ? ?For any non-urgent questions, you may also contact your provider using MyChart. We now offer e-Visits for anyone 33 and older to request care online for non-urgent symptoms. For details visit [email or web address]. ?  ?Also download the MyChart app! Go to the app store, search "MyChart", open the app, select [facility], and log in with your MyChart username and password. ? ?Due to Covid, a mask is required upon entering the hospital/clinic. If you do not have a mask, one will be given to you upon arrival. For doctor visits, patients may have 1 support person aged 50 or older with them. For treatment visits, patients cannot have anyone with them due to current Covid guidelines and our immunocompromised population.  ? ?Bevacizumab injection ?What is this medication? ?BEVACIZUMAB (be va SIZ yoo mab) is a monoclonal antibody. It is used to treat many types of cancer. ?This medicine may be used for other purposes; ask your health care provider or pharmacist if you have questions. ?COMMON BRAND NAME(S): Alymsys,  Avastin, MVASI, Zirabev ?What should I tell my care team before I take this medication? ?They need to know if you have any of these conditions: ?diabetes ?heart disease ?high blood pressure ?history of coughing up blood ?prior anthracycline chemotherapy (e.g., doxorubicin, daunorubicin, epirubicin) ?recent or  ongoing radiation therapy ?recent or planning to have surgery ?stroke ?an unusual or allergic reaction to bevacizumab, hamster proteins, mouse proteins, other medicines, foods, dyes, or preservatives ?pregnant or trying to get pregnant ?breast-feeding ?How should I use this medication? ?This medicine is for infusion into a vein. It is given by a health care professional in a hospital or clinic setting. ?Talk to your pediatrician regarding the use of this medicine in children. Special care may be needed. ?Overdosage: If you think you have taken too much of this medicine contact a poison control center or emergency room at once. ?NOTE: This medicine is only for you. Do not share this medicine with others. ?What if I miss a dose? ?It is important not to miss your dose. Call your doctor or health care professional if you are unable to keep an appointment. ?What may interact with this medication? ?Interactions are not expected. ?This list may not describe all possible interactions. Give your health care provider a list of all the medicines, herbs, non-prescription drugs, or dietary supplements you use. Also tell them if you smoke, drink alcohol, or use illegal drugs. Some items may interact with your medicine. ?What should I watch for while using this medication? ?Your condition will be monitored carefully while you are receiving this medicine. You will need important blood work and urine testing done while you are taking this medicine. ?This medicine may increase your risk to bruise or bleed. Call your doctor or health care professional if you notice any unusual bleeding. ?Before having surgery, talk to your health care provider to make sure it is ok. This drug can increase the risk of poor healing of your surgical site or wound. You will need to stop this drug for 28 days before surgery. After surgery, wait at least 28 days before restarting this drug. Make sure the surgical site or wound is healed enough before  restarting this drug. Talk to your health care provider if questions. ?Do not become pregnant while taking this medicine or for 6 months after stopping it. Women should inform their doctor if they wish to become pregnant or think they might be pregnant. There is a potential for serious side effects to an unborn child. Talk to your health care professional or pharmacist for more information. Do not breast-feed an infant while taking this medicine and for 6 months after the last dose. ?This medicine has caused ovarian failure in some women. This medicine may interfere with the ability to have a child. You should talk to your doctor or health care professional if you are concerned about your fertility. ?What side effects may I notice from receiving this medication? ?Side effects that you should report to your doctor or health care professional as soon as possible: ?allergic reactions like skin rash, itching or hives, swelling of the face, lips, or tongue ?chest pain or chest tightness ?chills ?coughing up blood ?high fever ?seizures ?severe constipation ?signs and symptoms of bleeding such as bloody or black, tarry stools; red or dark-brown urine; spitting up blood or brown material that looks like coffee grounds; red spots on the skin; unusual bruising or bleeding from the eye, gums, or nose ?signs and symptoms of a blood clot such as breathing  problems; chest pain; severe, sudden headache; pain, swelling, warmth in the leg ?signs and symptoms of a stroke like changes in vision; confusion; trouble speaking or understanding; severe headaches; sudden numbness or weakness of the face, arm or leg; trouble walking; dizziness; loss of balance or coordination ?stomach pain ?sweating ?swelling of legs or ankles ?vomiting ?weight gain ?Side effects that usually do not require medical attention (report to your doctor or health care professional if they continue or are bothersome): ?back pain ?changes in taste ?decreased  appetite ?dry skin ?nausea ?tiredness ?This list may not describe all possible side effects. Call your doctor for medical advice about side effects. You may report side effects to FDA at 1-800-FDA-1088. ?Where should I keep my

## 2021-05-24 LAB — URINE CULTURE

## 2021-06-06 DIAGNOSIS — D225 Melanocytic nevi of trunk: Secondary | ICD-10-CM | POA: Diagnosis not present

## 2021-06-06 DIAGNOSIS — D2239 Melanocytic nevi of other parts of face: Secondary | ICD-10-CM | POA: Diagnosis not present

## 2021-06-06 DIAGNOSIS — L821 Other seborrheic keratosis: Secondary | ICD-10-CM | POA: Diagnosis not present

## 2021-06-06 DIAGNOSIS — Z85828 Personal history of other malignant neoplasm of skin: Secondary | ICD-10-CM | POA: Diagnosis not present

## 2021-06-06 DIAGNOSIS — L570 Actinic keratosis: Secondary | ICD-10-CM | POA: Diagnosis not present

## 2021-06-06 DIAGNOSIS — H61001 Unspecified perichondritis of right external ear: Secondary | ICD-10-CM | POA: Diagnosis not present

## 2021-06-10 ENCOUNTER — Other Ambulatory Visit: Payer: Self-pay | Admitting: Oncology

## 2021-06-12 ENCOUNTER — Encounter: Payer: Self-pay | Admitting: *Deleted

## 2021-06-12 ENCOUNTER — Inpatient Hospital Stay: Payer: Medicare Other | Attending: Nurse Practitioner

## 2021-06-12 ENCOUNTER — Other Ambulatory Visit: Payer: Self-pay | Admitting: Nurse Practitioner

## 2021-06-12 ENCOUNTER — Inpatient Hospital Stay (HOSPITAL_BASED_OUTPATIENT_CLINIC_OR_DEPARTMENT_OTHER): Payer: Medicare Other | Admitting: Oncology

## 2021-06-12 ENCOUNTER — Inpatient Hospital Stay: Payer: Medicare Other

## 2021-06-12 ENCOUNTER — Other Ambulatory Visit: Payer: Self-pay

## 2021-06-12 VITALS — BP 121/90 | HR 60 | Temp 98.1°F | Resp 18 | Ht 62.0 in | Wt 120.1 lb

## 2021-06-12 VITALS — BP 116/85 | HR 102 | Resp 18

## 2021-06-12 DIAGNOSIS — Z87440 Personal history of urinary (tract) infections: Secondary | ICD-10-CM | POA: Insufficient documentation

## 2021-06-12 DIAGNOSIS — N390 Urinary tract infection, site not specified: Secondary | ICD-10-CM | POA: Diagnosis not present

## 2021-06-12 DIAGNOSIS — C220 Liver cell carcinoma: Secondary | ICD-10-CM | POA: Diagnosis not present

## 2021-06-12 DIAGNOSIS — E119 Type 2 diabetes mellitus without complications: Secondary | ICD-10-CM | POA: Diagnosis not present

## 2021-06-12 DIAGNOSIS — I2510 Atherosclerotic heart disease of native coronary artery without angina pectoris: Secondary | ICD-10-CM | POA: Diagnosis not present

## 2021-06-12 DIAGNOSIS — Z85828 Personal history of other malignant neoplasm of skin: Secondary | ICD-10-CM | POA: Diagnosis not present

## 2021-06-12 DIAGNOSIS — Z5112 Encounter for antineoplastic immunotherapy: Secondary | ICD-10-CM | POA: Insufficient documentation

## 2021-06-12 DIAGNOSIS — R634 Abnormal weight loss: Secondary | ICD-10-CM | POA: Insufficient documentation

## 2021-06-12 DIAGNOSIS — R630 Anorexia: Secondary | ICD-10-CM | POA: Diagnosis not present

## 2021-06-12 DIAGNOSIS — I4891 Unspecified atrial fibrillation: Secondary | ICD-10-CM | POA: Diagnosis not present

## 2021-06-12 LAB — CMP (CANCER CENTER ONLY)
ALT: 290 U/L (ref 0–44)
AST: 97 U/L — ABNORMAL HIGH (ref 15–41)
Albumin: 3.3 g/dL — ABNORMAL LOW (ref 3.5–5.0)
Alkaline Phosphatase: 139 U/L — ABNORMAL HIGH (ref 38–126)
Anion gap: 14 (ref 5–15)
BUN: 25 mg/dL — ABNORMAL HIGH (ref 8–23)
CO2: 20 mmol/L — ABNORMAL LOW (ref 22–32)
Calcium: 8.7 mg/dL — ABNORMAL LOW (ref 8.9–10.3)
Chloride: 102 mmol/L (ref 98–111)
Creatinine: 1.75 mg/dL — ABNORMAL HIGH (ref 0.44–1.00)
GFR, Estimated: 27 mL/min — ABNORMAL LOW (ref >60)
Glucose, Bld: 259 mg/dL — ABNORMAL HIGH (ref 70–99)
Potassium: 4 mmol/L (ref 3.5–5.1)
Sodium: 136 mmol/L (ref 135–145)
Total Bilirubin: 1.2 mg/dL (ref 0.3–1.2)
Total Protein: 6.2 g/dL — ABNORMAL LOW (ref 6.5–8.1)

## 2021-06-12 LAB — CBC WITH DIFFERENTIAL (CANCER CENTER ONLY)
Abs Immature Granulocytes: 0.07 10*3/uL (ref 0.00–0.07)
Basophils Absolute: 0 10*3/uL (ref 0.0–0.1)
Basophils Relative: 0 %
Eosinophils Absolute: 0.1 10*3/uL (ref 0.0–0.5)
Eosinophils Relative: 1 %
HCT: 45.4 % (ref 36.0–46.0)
Hemoglobin: 13.6 g/dL (ref 12.0–15.0)
Immature Granulocytes: 1 %
Lymphocytes Relative: 19 %
Lymphs Abs: 2.6 10*3/uL (ref 0.7–4.0)
MCH: 24.6 pg — ABNORMAL LOW (ref 26.0–34.0)
MCHC: 30 g/dL (ref 30.0–36.0)
MCV: 82.1 fL (ref 80.0–100.0)
Monocytes Absolute: 0.6 10*3/uL (ref 0.1–1.0)
Monocytes Relative: 4 %
Neutro Abs: 10.6 10*3/uL — ABNORMAL HIGH (ref 1.7–7.7)
Neutrophils Relative %: 75 %
Platelet Count: 342 10*3/uL (ref 150–400)
RBC: 5.53 MIL/uL — ABNORMAL HIGH (ref 3.87–5.11)
RDW: 21.2 % — ABNORMAL HIGH (ref 11.5–15.5)
WBC Count: 14 10*3/uL — ABNORMAL HIGH (ref 4.0–10.5)
nRBC: 0 % (ref 0.0–0.2)

## 2021-06-12 LAB — URINALYSIS, COMPLETE (UACMP) WITH MICROSCOPIC
Bilirubin Urine: NEGATIVE
Glucose, UA: NEGATIVE mg/dL
Ketones, ur: NEGATIVE mg/dL
Nitrite: NEGATIVE
Protein, ur: 100 mg/dL — AB
Specific Gravity, Urine: 1.022 (ref 1.005–1.030)
WBC, UA: >50 WBC/hpf — ABNORMAL HIGH (ref 0–5)
pH: 6.5 (ref 5.0–8.0)

## 2021-06-12 LAB — TOTAL PROTEIN, URINE DIPSTICK: Protein, ur: 100 mg/dL — AB

## 2021-06-12 MED ORDER — AMOXICILLIN 250 MG PO CAPS: 250.0000 mg | ORAL_CAPSULE | Freq: Two times a day (BID) | ORAL | 0 refills | Status: AC

## 2021-06-12 MED ORDER — FLUCONAZOLE 100 MG PO TABS: 100.0000 mg | ORAL_TABLET | Freq: Every day | ORAL | 0 refills | Status: AC

## 2021-06-12 MED ORDER — SODIUM CHLORIDE 0.9 % IV SOLN: Freq: Once | INTRAVENOUS | Status: AC

## 2021-06-12 MED ORDER — MAGIC MOUTHWASH: 5.0000 mL | Freq: Four times a day (QID) | ORAL | 1 refills | Status: DC | PRN

## 2021-06-12 MED ORDER — SODIUM CHLORIDE 0.9 % IV SOLN
800.0000 mg | Freq: Once | INTRAVENOUS | Status: AC
  Administered 2021-06-12: 800 mg via INTRAVENOUS
  Filled 2021-06-12: qty 32

## 2021-06-12 NOTE — Progress Notes (Signed)
?[facility] ?OFFICE PROGRESS NOTE ? ? ?Diagnosis: Hepatocellular carcinoma ? ?INTERVAL HISTORY:  ? ?Stacy [name] returns as scheduled.  She continues prednisone at a dose of 20 mg daily.  She continues to have mouth discomfort.  Magic mouthwash helps.  The urine is foul-smelling. ? ?Objective: ? ?Vital signs in last 24 hours: ? ?Blood pressure 121/90, pulse 60, temperature 98.1 ?F (36.7 ?C), temperature source Oral, resp. rate 18, height '5\' 2"'$  (1.575 m), weight 120 lb 1.6 oz (54.5 kg), SpO2 99 %. ?  ? ?HEENT: Mild thrush at the bilateral buccal mucosa ?Resp: Decreased breath sounds with end inspiratory rhonchi at the right lower posterior chest, no respiratory distress ?Cardio: Regular rate and rhythm ?GI: No hepatosplenomegaly ?Vascular: No leg edema  ?Skin: No rash ? ? ?Lab Results: ? ?Lab Results  ?Component Value Date  ? WBC 14.0 (H) 06/12/2021  ? HGB 13.6 06/12/2021  ? HCT 45.4 06/12/2021  ? MCV 82.1 06/12/2021  ? PLT 342 06/12/2021  ? NEUTROABS 10.6 (H) 06/12/2021  ? ? ?CMP  ?Lab Results  ?Component Value Date  ? NA 136 06/12/2021  ? K 4.0 06/12/2021  ? CL 102 06/12/2021  ? CO2 20 (L) 06/12/2021  ? GLUCOSE 259 (H) 06/12/2021  ? BUN 25 (H) 06/12/2021  ? CREATININE 1.75 (H) 06/12/2021  ? CALCIUM 8.7 (L) 06/12/2021  ? PROT 6.2 (L) 06/12/2021  ? ALBUMIN 3.3 (L) 06/12/2021  ? AST 97 (H) 06/12/2021  ? ALT 290 (HH) 06/12/2021  ? ALKPHOS 139 (H) 06/12/2021  ? BILITOT 1.2 06/12/2021  ? GFRNONAA 27 (L) 06/12/2021  ? GFRAA 54 (L) 02/22/2020  ? ? ?Lab Results  ?Component Value Date  ? CEA 2.45 08/02/2020  ? [id number] 27 08/02/2020  ? ? ? ?Medications: I have reviewed the patient's current medications. ? ? ?Assessment/Plan: ?Multiple liver masses ?CT abdomen/pelvis 07/11/2018, low-attenuation liver lesions incompletely characterized without contrast ?Ultrasound abdomen 07/10/2020-innumerable incompletely assessed hypoechoic masses in the liver ?MRI abdomen 07/29/2020- innumerable hypervascular liver masses, nodular  liver contour, 1.5 cm left adrenal mass ?Biopsy liver lesion 08/08/2020-hepatocellular carcinoma, moderately differentiated ?Cycle 1 atezolizumab/bevacizumab 08/18/2020 ?Cycle 2 atezolizumab/bevacizumab 09/07/2020 ?Cycle 3 atezolizumab/bevacizumab 09/28/2020 ?Cycle 4 atezolizumab/bevacizumab 10/19/2020 ?MRI liver 11/07/2020-numerous hyperenhancing liver masses-stable to mildly improved compared to the MRI 07/29/2020 ?Cycle 5 atezolizumab/bevacizumab 11/10/2020 ?Cycle 6 atezolizumab/bevacizumab 12/04/2020 ?Cycle 7 atezolizumab/bevacizumab 12/26/2020 ?Cycle 8 atezolizumab/bevacizumab 01/16/2021 ?Cycle 9 atezolizumab/bevacizumab 02/06/2021 ?Cycle 10 atezolizumab/bevacizumab 02/26/2021 ?03/20/2021 MRI abdomen-right greater than left multifocal hepatocellular carcinoma, similar to exam 11/07/2020; new small right pleural effusion. ?Cycle 11 atezolizumab/bevacizumab 03/22/2021 ?Cycle 12 atezolizumab/bevacizumab 04/12/2021 HELD due to increased LFTs ?Cycle 13 bevacizumab 05/02/2021, atezolizumab held secondary to elevated liver enzymes ?Cycle 14 bevacizumab 05/23/2021, atezolizumab held secondary to elevated liver enzymes ?Cycle 15 bevacizumab 06/12/2021, atezolizumab held secondary to elevated liver enzymes ?Coronary artery disease ?Atrial fibrillation-maintained on anticoagulation ?History of basal cell and squamous cell skin cancers ?Diabetes ?Anorexia/weight loss ?Abdominal pain ?Urinary tract infection 08/30/2020 ?  ? ? ?Disposition: ?Stacy [name] appears stable.  The liver enzymes have improved, but have not returned to baseline.  The prednisone will be tapered to 10 mg daily.  She will complete a cycle of bevacizumab today.  She will return for an office visit and treatment in 3 weeks.  The plan is to resume atezolizumab if the liver enzymes have improved when she is here in 3 weeks. ? ?She will complete a course of Diflucan for oral candidiasis.  We will check a urinalysis today. ? ?[name], MD ? ?06/12/2021  ?  10:42 AM ? ? ?

## 2021-06-12 NOTE — Progress Notes (Deleted)
?  [facility] ?OFFICE PROGRESS NOTE ? ? ?Diagnosis:  ? ?INTERVAL HISTORY:  ? ?*** ? ?Objective: ? ?Vital signs in last 24 hours: ? ?Blood pressure 121/90, pulse 60, temperature 98.1 ?F (36.7 ?C), temperature source Oral, resp. rate 18, height '5\' 2"'$  (1.575 m), weight 120 lb 1.6 oz (54.5 kg), SpO2 99 %. ?  ? ?HEENT: *** ?Lymphatics: *** ?Resp: *** ?Cardio: *** ?GI: *** ?Vascular: *** ?Neuro:***  ?Skin:***  ? ?Portacath/PICC-without erythema ? ?Lab Results: ? ?Lab Results  ?Component Value Date  ? WBC 14.0 (H) 06/12/2021  ? HGB 13.6 06/12/2021  ? HCT 45.4 06/12/2021  ? MCV 82.1 06/12/2021  ? PLT 342 06/12/2021  ? NEUTROABS 10.6 (H) 06/12/2021  ? ? ?CMP  ?Lab Results  ?Component Value Date  ? NA 140 05/23/2021  ? K 3.5 05/23/2021  ? CL 104 05/23/2021  ? CO2 21 (L) 05/23/2021  ? GLUCOSE 214 (H) 05/23/2021  ? BUN 22 05/23/2021  ? CREATININE 1.58 (H) 05/23/2021  ? CALCIUM 9.0 05/23/2021  ? PROT 6.6 05/23/2021  ? ALBUMIN 3.7 05/23/2021  ? AST 149 (H) 05/23/2021  ? ALT 551 (HH) 05/23/2021  ? ALKPHOS 168 (H) 05/23/2021  ? BILITOT 0.9 05/23/2021  ? GFRNONAA 30 (L) 05/23/2021  ? GFRAA 54 (L) 02/22/2020  ? ? ?Lab Results  ?Component Value Date  ? CEA 2.45 08/02/2020  ? [id number] 27 08/02/2020  ? ? ?Lab Results  ?Component Value Date  ? INR 1.0 08/08/2020  ? LABPROT 12.9 08/08/2020  ? ? ?Imaging: ? ?No results found. ? ?Medications: I have reviewed the patient's current medications. ? ? ?Assessment/Plan: ?Multiple liver masses ?CT abdomen/pelvis 07/11/2018, low-attenuation liver lesions incompletely characterized without contrast ?Ultrasound abdomen 07/10/2020-innumerable incompletely assessed hypoechoic masses in the liver ?MRI abdomen 07/29/2020- innumerable hypervascular liver masses, nodular liver contour, 1.5 cm left adrenal mass ?Biopsy liver lesion 08/08/2020-hepatocellular carcinoma, moderately differentiated ?Cycle 1 atezolizumab/bevacizumab 08/18/2020 ?Cycle 2 atezolizumab/bevacizumab 09/07/2020 ?Cycle 3  atezolizumab/bevacizumab 09/28/2020 ?Cycle 4 atezolizumab/bevacizumab 10/19/2020 ?MRI liver 11/07/2020-numerous hyperenhancing liver masses-stable to mildly improved compared to the MRI 07/29/2020 ?Cycle 5 atezolizumab/bevacizumab 11/10/2020 ?Cycle 6 atezolizumab/bevacizumab 12/04/2020 ?Cycle 7 atezolizumab/bevacizumab 12/26/2020 ?Cycle 8 atezolizumab/bevacizumab 01/16/2021 ?Cycle 9 atezolizumab/bevacizumab 02/06/2021 ?Cycle 10 atezolizumab/bevacizumab 02/26/2021 ?03/20/2021 MRI abdomen-right greater than left multifocal hepatocellular carcinoma, similar to exam 11/07/2020; new small right pleural effusion. ?Cycle 11 atezolizumab/bevacizumab 03/22/2021 ?Cycle 12 atezolizumab/bevacizumab 04/12/2021 HELD due to increased LFTs ?Cycle 13 bevacizumab 05/02/2021, atezolizumab held secondary to elevated liver enzymes ?Cycle 14 bevacizumab 05/23/2021, atezolizumab held secondary to elevated liver enzymes ?Coronary artery disease ?Atrial fibrillation-maintained on anticoagulation ?History of basal cell and squamous cell skin cancers ?Diabetes ?Anorexia/weight loss ?Abdominal pain ?Urinary tract infection 08/30/2020 ?  ? ? ? ? ? ? ?Disposition: ?*** ? ?[name], MD ? ?06/12/2021  ?10:04 AM ? ? ?

## 2021-06-12 NOTE — Progress Notes (Signed)
CRITICAL VALUE STICKER ? ?CRITICAL VALUE: ALT 290 ? ?RECEIVER (on-site recipient of call):Miraya Cudney,RN ? ?DATE & TIME NOTIFIED: 06/12/21 @ 1024 ? ?MESSENGER (representative from lab):Diane ? ?MD NOTIFIED: Dr. Benay Spice ? ?TIME OF NOTIFICATION:1030 ? ?RESPONSE: Hold atezolizumab today ? ?

## 2021-06-12 NOTE — Progress Notes (Signed)
Patient presents for treatment. RN assessment completed along with the following: ? ?Labs/vitals reviewed - Yes, and Please see collab nurse note.    ?Weight within 10% of previous measurement - Yes ?Informed consent completed and reflects current therapy/intent - Yes, on date 08/18/2020             ?Provider progress note reviewed - Yes, today's provider note was reviewed. ?Treatment/Antibody/Supportive plan reviewed - Yes, and there are no adjustments needed for today's treatment. ?S&H and other orders reviewed - Yes, and there are no additional orders identified. ?Previous treatment date reviewed - Yes, and the appropriate amount of time has elapsed between treatments. ?Clinic Hand Off Received from - Yes from [place], RN ? ?Patient to proceed with treatment.  ? ?

## 2021-06-12 NOTE — Patient Instructions (Signed)
[facility]   ?Discharge Instructions: ?Thank you for choosing [facility] to provide your oncology and hematology care.  ? ?If you have a lab appointment with the [facility], please go directly to the [facility] and check in at the registration area. ?  ?Wear comfortable clothing and clothing appropriate for easy access to any Portacath or PICC line.  ? ?We strive to give you quality time with your provider. You may need to reschedule your appointment if you arrive late (15 or more minutes).  Arriving late affects you and other patients whose appointments are after yours.  Also, if you miss three or more appointments without notifying the office, you may be dismissed from the clinic at the provider?s discretion.    ?  ?For prescription refill requests, have your pharmacy contact our office and allow 72 hours for refills to be completed.   ? ?Today you received the following chemotherapy and/or immunotherapy agents Bevacizumab-awwb (MVASI).    ?  ?To help prevent nausea and vomiting after your treatment, we encourage you to take your nausea medication as directed. ? ?BELOW ARE SYMPTOMS THAT SHOULD BE REPORTED IMMEDIATELY: ?*FEVER GREATER THAN 100.4 F (38 ?C) OR HIGHER ?*CHILLS OR SWEATING ?*NAUSEA AND VOMITING THAT IS NOT CONTROLLED WITH YOUR NAUSEA MEDICATION ?*UNUSUAL SHORTNESS OF BREATH ?*UNUSUAL BRUISING OR BLEEDING ?*URINARY PROBLEMS (pain or burning when urinating, or frequent urination) ?*BOWEL PROBLEMS (unusual diarrhea, constipation, pain near the anus) ?TENDERNESS IN MOUTH AND THROAT WITH OR WITHOUT PRESENCE OF ULCERS (sore throat, sores in mouth, or a toothache) ?UNUSUAL RASH, SWELLING OR PAIN  ?UNUSUAL VAGINAL DISCHARGE OR ITCHING  ? ?Items with * indicate a potential emergency and should be followed up as soon as possible or go to the Emergency Department if any problems should occur. ? ?Please show the CHEMOTHERAPY ALERT CARD or IMMUNOTHERAPY ALERT CARD  at check-in to the Emergency Department and triage nurse. ? ?Should you have questions after your visit or need to cancel or reschedule your appointment, please contact [facility]  Dept: [phone]  and follow the prompts.  Office hours are 8:00 a.m. to 4:30 p.m. Monday - Friday. Please note that voicemails left after 4:00 p.m. may not be returned until the following business day.  We are closed weekends and major holidays. You have access to a nurse at all times for urgent questions. Please call the main number to the clinic Dept: [phone] and follow the prompts. ? ? ?For any non-urgent questions, you may also contact your provider using MyChart. We now offer e-Visits for anyone 84 and older to request care online for non-urgent symptoms. For details visit [email or web address]. ?  ?Also download the MyChart app! Go to the app store, search "MyChart", open the app, select [facility], and log in with your MyChart username and password. ? ?Due to Covid, a mask is required upon entering the hospital/clinic. If you do not have a mask, one will be given to you upon arrival. For doctor visits, patients may have 1 support person aged 10 or older with them. For treatment visits, patients cannot have anyone with them due to current Covid guidelines and our immunocompromised population.  ? ?Bevacizumab injection ?What is this medication? ?BEVACIZUMAB (be va SIZ yoo mab) is a monoclonal antibody. It is used to treat many types of cancer. ?This medicine may be used for other purposes; ask your health care provider or pharmacist if you have questions. ?COMMON BRAND NAME(S):  Alymsys, Avastin, MVASI, Zirabev ?What should I tell my care team before I take this medication? ?They need to know if you have any of these conditions: ?diabetes ?heart disease ?high blood pressure ?history of coughing up blood ?prior anthracycline chemotherapy (e.g., doxorubicin, daunorubicin, epirubicin) ?recent or  ongoing radiation therapy ?recent or planning to have surgery ?stroke ?an unusual or allergic reaction to bevacizumab, hamster proteins, mouse proteins, other medicines, foods, dyes, or preservatives ?pregnant or trying to get pregnant ?breast-feeding ?How should I use this medication? ?This medicine is for infusion into a vein. It is given by a health care professional in a hospital or clinic setting. ?Talk to your pediatrician regarding the use of this medicine in children. Special care may be needed. ?Overdosage: If you think you have taken too much of this medicine contact a poison control center or emergency room at once. ?NOTE: This medicine is only for you. Do not share this medicine with others. ?What if I miss a dose? ?It is important not to miss your dose. Call your doctor or health care professional if you are unable to keep an appointment. ?What may interact with this medication? ?Interactions are not expected. ?This list may not describe all possible interactions. Give your health care provider a list of all the medicines, herbs, non-prescription drugs, or dietary supplements you use. Also tell them if you smoke, drink alcohol, or use illegal drugs. Some items may interact with your medicine. ?What should I watch for while using this medication? ?Your condition will be monitored carefully while you are receiving this medicine. You will need important blood work and urine testing done while you are taking this medicine. ?This medicine may increase your risk to bruise or bleed. Call your doctor or health care professional if you notice any unusual bleeding. ?Before having surgery, talk to your health care provider to make sure it is ok. This drug can increase the risk of poor healing of your surgical site or wound. You will need to stop this drug for 28 days before surgery. After surgery, wait at least 28 days before restarting this drug. Make sure the surgical site or wound is healed enough before  restarting this drug. Talk to your health care provider if questions. ?Do not become pregnant while taking this medicine or for 6 months after stopping it. Women should inform their doctor if they wish to become pregnant or think they might be pregnant. There is a potential for serious side effects to an unborn child. Talk to your health care professional or pharmacist for more information. Do not breast-feed an infant while taking this medicine and for 6 months after the last dose. ?This medicine has caused ovarian failure in some women. This medicine may interfere with the ability to have a child. You should talk to your doctor or health care professional if you are concerned about your fertility. ?What side effects may I notice from receiving this medication? ?Side effects that you should report to your doctor or health care professional as soon as possible: ?allergic reactions like skin rash, itching or hives, swelling of the face, lips, or tongue ?chest pain or chest tightness ?chills ?coughing up blood ?high fever ?seizures ?severe constipation ?signs and symptoms of bleeding such as bloody or black, tarry stools; red or dark-brown urine; spitting up blood or brown material that looks like coffee grounds; red spots on the skin; unusual bruising or bleeding from the eye, gums, or nose ?signs and symptoms of a blood clot such as  breathing problems; chest pain; severe, sudden headache; pain, swelling, warmth in the leg ?signs and symptoms of a stroke like changes in vision; confusion; trouble speaking or understanding; severe headaches; sudden numbness or weakness of the face, arm or leg; trouble walking; dizziness; loss of balance or coordination ?stomach pain ?sweating ?swelling of legs or ankles ?vomiting ?weight gain ?Side effects that usually do not require medical attention (report to your doctor or health care professional if they continue or are bothersome): ?back pain ?changes in taste ?decreased  appetite ?dry skin ?nausea ?tiredness ?This list may not describe all possible side effects. Call your doctor for medical advice about side effects. You may report side effects to FDA at 1-800-FDA-1088. ?Where should

## 2021-06-12 NOTE — Progress Notes (Signed)
Patient seen by Dr. Benay Spice today ? ?Vitals are within treatment parameters. ? ?Labs reviewed by Dr. Benay Spice and are not all within treatment parameters. UA protein = 100 (OK to proceed) ?Creatinine 1.75 and ALT 290 with AST 97--OK to proceed ?Per physician team, proceed with Avastin and hold Atezolizumab. ?

## 2021-06-13 ENCOUNTER — Telehealth: Payer: Self-pay

## 2021-06-13 NOTE — Telephone Encounter (Signed)
-----   Message from Owens Shark, NP sent at 06/12/2021  5:01 PM EDT ----- ?Please let her daughter know the urinalysis is consistent with an infection.  I sent a prescription to her pharmacy for amoxicillin.  I attempted to call her daughter at the numbers listed in Ms. Rincon's chart without success.  I did leave a message.  Please confirm they received this.  Thanks ? ?

## 2021-06-13 NOTE — Telephone Encounter (Signed)
Called the patient's daughter to let her know her mother urinalysis is consistent with an infection and prescription was sent to her pharmacy. Mrs. Stacy [name] stated she had the new prescription and her mother had already started taking the medication. Patient's daughter gave verbal understanding and had no further questions or concerns. ?

## 2021-06-13 NOTE — Telephone Encounter (Signed)
Review the chart ?

## 2021-06-14 ENCOUNTER — Encounter: Payer: Self-pay | Admitting: Oncology

## 2021-06-14 ENCOUNTER — Ambulatory Visit (INDEPENDENT_AMBULATORY_CARE_PROVIDER_SITE_OTHER): Payer: Medicare Other | Admitting: Cardiovascular Disease

## 2021-06-14 ENCOUNTER — Encounter: Payer: Self-pay | Admitting: Cardiovascular Disease

## 2021-06-14 VITALS — BP 106/68 | HR 72 | Ht 62.0 in | Wt 119.8 lb

## 2021-06-14 DIAGNOSIS — I10 Essential (primary) hypertension: Secondary | ICD-10-CM

## 2021-06-14 DIAGNOSIS — I2510 Atherosclerotic heart disease of native coronary artery without angina pectoris: Secondary | ICD-10-CM | POA: Diagnosis not present

## 2021-06-14 DIAGNOSIS — I480 Paroxysmal atrial fibrillation: Secondary | ICD-10-CM | POA: Diagnosis not present

## 2021-06-14 DIAGNOSIS — E782 Mixed hyperlipidemia: Secondary | ICD-10-CM | POA: Diagnosis not present

## 2021-06-14 LAB — URINE CULTURE: Culture: >=100000 — AB

## 2021-06-14 MED ORDER — APIXABAN 2.5 MG PO TABS: 2.5000 mg | ORAL_TABLET | Freq: Two times a day (BID) | ORAL | 3 refills | Status: AC

## 2021-06-14 MED ORDER — METOPROLOL SUCCINATE ER 25 MG PO TB24: ORAL_TABLET | ORAL | 3 refills | Status: DC

## 2021-06-14 NOTE — Patient Instructions (Signed)
Medication Instructions:  ?INCREASE Metoprolol Succinate (Toprol XL) to 37.'5mg'$  twice daily ?*If you need a refill on your cardiac medications before your next appointment, please call your pharmacy* ? ? ?Lab Work: ?NONE ?If you have labs (blood work) drawn today and your tests are completely normal, you will receive your results only by: ?MyChart Message (if you have MyChart) OR ?A paper copy in the mail ?If you have any lab test that is abnormal or we need to change your treatment, we will call you to review the results. ? ? ?Testing/Procedures: ?NONE ? ? ?Follow-Up: ?At [facility], you and your health needs are our priority.  As part of our continuing mission to provide you with exceptional heart care, we have created designated Provider Care Teams.  These Care Teams include your primary Cardiologist (physician) and Advanced Practice Providers (APPs -  Physician Assistants and Nurse Practitioners) who all work together to provide you with the care you need, when you need it. ? ?Your next appointment:   ?3 month(s) ? ?The format for your next appointment:   ?In Person ? ?Provider:   ?Sherren Mocha, MD   ? ?  ? ?Important Information About Sugar ? ? ? ? ?  ?

## 2021-06-14 NOTE — Progress Notes (Signed)
?Cardiology Office Note:   ? ?Date:  06/15/2021  ? ?ID:  Stacy [name], DOB 1926/04/24, MRN [id number] ? ?PCP:  Lillard Anes, MD ?  ?[facility] Providers ?Cardiologist:  Sherren Mocha, MD ?Electrophysiologist:  Virl Axe, MD    ? ?Referring MD: Lillard Anes,*  ? ?Chief Complaint  ?Patient presents with  ? Shortness of Breath  ? ? ?History of Present Illness:   ? ?Stacy [name] is a 88 y.o. female with a hx of coronary artery disease and stroke, presenting for follow-up evaluation. The patient initially presented in 1997 with an anterior wall MI treated with balloon angioplasty of the LAD.  She later underwent stenting of the RCA in 2000.  Her most recent heart catheterization in 2008 demonstrated patency of her angioplasty and stent sites in the LAD and RCA, respectively.  The patient had a stroke in 2018.  A loop recorder demonstrated paroxysmal atrial fibrillation from which the patient has not had any associated symptoms.  She has tolerated oral anticoagulation without significant bleeding problems.  Recurrent atrial fibrillation has been demonstrated at follow-up.  She has been diagnosed with hepatocellular carcinoma and continues treatment under the care of Dr. Benay Spice. ? ?The patient is here with her daughter today.  She has been weaker of late and has had worsening of her exercise tolerance.  She feels fatigued and short of breath with low-level activity.  She has not had any chest pain or pressure.  Denies lightheadedness, syncope, or leg swelling.  No heart palpitations, but she has noticed that her heart rate is frequently elevated on her smart watch.  She continues on chronic anticoagulation without bleeding problems.  Appetite is improving. ? ?Past Medical History:  ?Diagnosis Date  ? Anxiety   ? Hyperlipidemia   ? ? ?Past Surgical History:  ?Procedure Laterality Date  ? 3 epidural steroid injections    ? APPENDECTOMY    ? Basal Cell Cancer Removal    ? forehead  ? CATARACT  EXTRACTION    ? CATARACT EXTRACTION  12/29/2003  ? CORONARY ANGIOPLASTY  03-15-1995  ? CORONARY ANGIOPLASTY WITH STENT PLACEMENT  05-19-1998  ? CORONARY STENT PLACEMENT  2000  ? DILATION AND CURETTAGE OF UTERUS    ? ENDARTERECTOMY Right 04/13/2015  ? Procedure: ENDARTERECTOMY CAROTID;  Surgeon: Serafina Mitchell, MD;  Location: [facility];  Service: Vascular;  Laterality: Right;  ? ENDOMETRIAL BIOPSY    ? EYE SURGERY    ? heart catherization    ? HEMORRHOID SURGERY    ? INCISIONAL HERNIA REPAIR    ? INGUINAL HERNIA REPAIR    ? LOOP RECORDER INSERTION N/A 06/24/2016  ? Procedure: Loop Recorder Insertion;  Surgeon: Deboraha Sprang, MD;  Location: [facility];  Service: Cardiovascular;  Laterality: N/A;  ? PATCH ANGIOPLASTY Right 04/13/2015  ? Procedure: PATCH ANGIOPLASTY;  Surgeon: Serafina Mitchell, MD;  Location: [facility];  Service: Vascular;  Laterality: Right;  ? PTCA    ? SQUAMOUS CELL CARCINOMA EXCISION    ? left lower leg  ? TEE WITHOUT CARDIOVERSION N/A 06/24/2016  ? Procedure: TRANSESOPHAGEAL ECHOCARDIOGRAM (TEE);  Surgeon: Dorothy Spark, MD;  Location: [facility] END[facility];  Service: Cardiovascular;  Laterality: N/A;  ? TONSILLECTOMY    ? ? ?Current Medications: ?Current Meds  ?Medication Sig  ? ACCU-CHEK SMARTVIEW test strip 1 each by Other route as needed for other (blood sugar test strips).   ? amoxicillin (AMOXIL) 250 MG capsule Take 1 capsule (250 mg  total) by mouth every 12 (twelve) hours for 7 days.  ? apixaban (ELIQUIS) 2.5 MG TABS tablet Take 1 tablet (2.5 mg total) by mouth 2 (two) times daily.  ? cetirizine (ZYRTEC) 10 MG tablet TAKE 1 TABLET BY MOUTH EVERY DAY  ? diphenhydrAMINE (BENADRYL) 25 MG tablet Take 25 mg by mouth every 6 (six) hours as needed.  ? Famotidine-Ca Carb-Mag Hydrox (PEPCID COMPLETE PO) Take by mouth.  ? fluconazole (DIFLUCAN) 100 MG tablet Take 1 tablet (100 mg total) by mouth daily for 5 days.  ? linaclotide (LINZESS) 72 MCG capsule Take 72 mcg by mouth daily. As needed  ? magic mouthwash  SOLN Take 5-10 mLs by mouth 4 (four) times daily as needed for mouth pain. Swish and spit  ? metoprolol succinate (TOPROL XL) 25 MG 24 hr tablet Take 1 and 1/2 tablets (37.'5mg'$ ) by mouth twice daily  ? mometasone (ELOCON) 0.1 % cream mometasone 0.1 % topical cream ? APPLY TO AFFECTED AREA EVERY DAY AS NEEDED ITCHING  ? NEOMYCIN-POLYMYXIN-HYDROCORTISONE (CORTISPORIN) 1 % SOLN otic solution Place 2 drops into both ears as directed.  ? nitroGLYCERIN (NITROSTAT) 0.4 MG SL tablet Place 1 tablet (0.4 mg total) under the tongue every 5 (five) minutes as needed for chest pain.  ? ondansetron (ZOFRAN ODT) 4 MG disintegrating tablet Take 1 tablet (4 mg total) by mouth every 8 (eight) hours as needed for nausea or vomiting.  ? predniSONE (DELTASONE) 10 MG tablet Take 2 tablets (20 mg total) by mouth daily with breakfast. (Patient taking differently: Take 10 mg by mouth daily with breakfast.)  ? rosuvastatin (CRESTOR) 10 MG tablet TAKE 1 TABLET BY MOUTH EVERY DAY  ? sertraline (ZOLOFT) 50 MG tablet TAKE 1 TABLET BY MOUTH EVERY DAY  ? traMADol (ULTRAM) 50 MG tablet Take 0.5-1 tablets (25-50 mg total) by mouth every 12 (twelve) hours as needed.  ? [DISCONTINUED] apixaban (ELIQUIS) 2.5 MG TABS tablet Take 1 tablet (2.5 mg total) by mouth 2 (two) times daily.  ? [DISCONTINUED] metoprolol succinate (TOPROL XL) 25 MG 24 hr tablet Take 1 tablet (25 mg total) by mouth in the morning and at bedtime.  ?  ? ?Allergies:   Iodinated contrast media, Iohexol, Sulfonamide derivatives, Latex, Tranilast, Metformin hcl, Other, Tape, Wound dressing adhesive, and Sulfa antibiotics  ? ?Social History  ? ?Socioeconomic History  ? Marital status: Widowed  ?  Spouse name: Not on file  ? Number of children: 1  ? Years of education: Not on file  ? Highest education level: Not on file  ?Occupational History  ? Occupation: Retired  ?  Employer: RETIRED  ?  Comment: [employer]  ?Tobacco Use  ? Smoking status: Former  ?  Packs/day: 1.00  ?  Years: 54.00  ?   Pack years: 54.00  ?  Types: Cigarettes  ?  Quit date: 10/05/1998  ?  Years since quitting: 22.7  ? Smokeless tobacco: Never  ?Vaping Use  ? Vaping Use: Never used  ?Substance and Sexual Activity  ? Alcohol use: Not Currently  ?  Alcohol/week: 0.0 standard drinks  ? Drug use: Never  ? Sexual activity: Not Currently  ?Other Topics Concern  ? Not on file  ?Social History Narrative  ? Not on file  ? ?Social Determinants of Health  ? ?Financial Resource Strain: Not on file  ?Food Insecurity: Not on file  ?Transportation Needs: Not on file  ?Physical Activity: Not on file  ?Stress: Not on file  ?Social Connections: Not on  file  ?  ? ?Family History: ?The patient's family history includes Cancer in her father; Diabetes in her father; Hypertension in her paternal uncle; Parkinsonism in her mother; Prostate cancer in her father; Stroke in her paternal uncle. There is no history of Heart attack. ? ?ROS:   ?Please see the history of present illness.    ?All other systems reviewed and are negative. ? ?EKGs/Labs/Other Studies Reviewed:   ? ?The following studies were reviewed today: ?Echo 08/16/2020: ?1. All apical segments and apex are akinetic consistent with prior LAD  ?infarction. The apex appears aneursymal. No definitive LV thrombus but  ?would consider limited contrast study if there is clinical concern for  ?this. EF is mildly reduced, 40-45%, and  ?WMA are unchanged from 2018 study. Left ventricular ejection fraction, by  ?estimation, is 40 to 45%. The left ventricle has mildly decreased  ?function. The left ventricle demonstrates regional wall motion  ?abnormalities (see scoring diagram/findings for  ?description). There is mild concentric left ventricular hypertrophy. Left  ?ventricular diastolic function could not be evaluated.  ? 2. Right ventricular systolic function is normal. The right ventricular  ?size is normal. There is normal pulmonary artery systolic pressure.  ? 3. The mitral valve is grossly normal. Mild  mitral valve regurgitation.  ?No evidence of mitral stenosis.  ? 4. The aortic valve is tricuspid. There is mild calcification of the  ?aortic valve. Aortic valve regurgitation is not visualized. Mild aort

## 2021-06-15 ENCOUNTER — Encounter: Payer: Self-pay | Admitting: Cardiovascular Disease

## 2021-06-18 ENCOUNTER — Other Ambulatory Visit: Payer: Medicare Other

## 2021-06-18 NOTE — Addendum Note (Signed)
Addended by: Gaetano Net on: 06/18/2021 09:16 AM ? ? Modules accepted: Orders ? ?

## 2021-06-19 ENCOUNTER — Other Ambulatory Visit: Payer: Self-pay | Admitting: Oncology

## 2021-06-19 ENCOUNTER — Other Ambulatory Visit: Payer: Medicare Other

## 2021-06-19 DIAGNOSIS — I10 Essential (primary) hypertension: Secondary | ICD-10-CM

## 2021-06-19 DIAGNOSIS — E1151 Type 2 diabetes mellitus with diabetic peripheral angiopathy without gangrene: Secondary | ICD-10-CM

## 2021-06-19 DIAGNOSIS — E782 Mixed hyperlipidemia: Secondary | ICD-10-CM | POA: Diagnosis not present

## 2021-06-19 DIAGNOSIS — C220 Liver cell carcinoma: Secondary | ICD-10-CM

## 2021-06-20 ENCOUNTER — Ambulatory Visit (INDEPENDENT_AMBULATORY_CARE_PROVIDER_SITE_OTHER): Payer: Medicare Other | Admitting: Legal Medicine

## 2021-06-20 ENCOUNTER — Encounter: Payer: Self-pay | Admitting: Legal Medicine

## 2021-06-20 ENCOUNTER — Encounter: Payer: Self-pay | Admitting: Oncology

## 2021-06-20 VITALS — BP 100/70 | HR 68 | Temp 97.1°F | Resp 16 | Ht 62.0 in | Wt 116.0 lb

## 2021-06-20 DIAGNOSIS — E1151 Type 2 diabetes mellitus with diabetic peripheral angiopathy without gangrene: Secondary | ICD-10-CM | POA: Diagnosis not present

## 2021-06-20 DIAGNOSIS — E1169 Type 2 diabetes mellitus with other specified complication: Secondary | ICD-10-CM | POA: Diagnosis not present

## 2021-06-20 DIAGNOSIS — C220 Liver cell carcinoma: Secondary | ICD-10-CM | POA: Diagnosis not present

## 2021-06-20 DIAGNOSIS — E1159 Type 2 diabetes mellitus with other circulatory complications: Secondary | ICD-10-CM | POA: Diagnosis not present

## 2021-06-20 DIAGNOSIS — E782 Mixed hyperlipidemia: Secondary | ICD-10-CM

## 2021-06-20 DIAGNOSIS — I10 Essential (primary) hypertension: Secondary | ICD-10-CM | POA: Diagnosis not present

## 2021-06-20 DIAGNOSIS — H906 Mixed conductive and sensorineural hearing loss, bilateral: Secondary | ICD-10-CM | POA: Diagnosis not present

## 2021-06-20 DIAGNOSIS — N184 Chronic kidney disease, stage 4 (severe): Secondary | ICD-10-CM

## 2021-06-20 DIAGNOSIS — E669 Obesity, unspecified: Secondary | ICD-10-CM

## 2021-06-20 DIAGNOSIS — I480 Paroxysmal atrial fibrillation: Secondary | ICD-10-CM

## 2021-06-20 DIAGNOSIS — N3000 Acute cystitis without hematuria: Secondary | ICD-10-CM | POA: Diagnosis not present

## 2021-06-20 DIAGNOSIS — I152 Hypertension secondary to endocrine disorders: Secondary | ICD-10-CM

## 2021-06-20 LAB — COMPREHENSIVE METABOLIC PANEL
ALT: 410 IU/L (ref 0–32)
AST: 161 IU/L — ABNORMAL HIGH (ref 0–40)
Albumin/Globulin Ratio: 1.3 (ref 1.2–2.2)
Albumin: 3.6 g/dL (ref 3.5–4.6)
Alkaline Phosphatase: 180 IU/L — ABNORMAL HIGH (ref 44–121)
BUN/Creatinine Ratio: 8 — ABNORMAL LOW (ref 12–28)
BUN: 16 mg/dL (ref 10–36)
Bilirubin Total: 1 mg/dL (ref 0.0–1.2)
CO2: 18 mmol/L — ABNORMAL LOW (ref 20–29)
Calcium: 9 mg/dL (ref 8.7–10.3)
Chloride: 100 mmol/L (ref 96–106)
Creatinine, Ser: 2.11 mg/dL — ABNORMAL HIGH (ref 0.57–1.00)
Globulin, Total: 2.8 g/dL (ref 1.5–4.5)
Glucose: 225 mg/dL — ABNORMAL HIGH (ref 70–99)
Potassium: 4.9 mmol/L (ref 3.5–5.2)
Sodium: 139 mmol/L (ref 134–144)
Total Protein: 6.4 g/dL (ref 6.0–8.5)
eGFR: 21 mL/min/{1.73_m2} — ABNORMAL LOW (ref >59)

## 2021-06-20 LAB — LIPID PANEL
Chol/HDL Ratio: 3.1 ratio (ref 0.0–4.4)
Cholesterol, Total: 133 mg/dL (ref 100–199)
HDL: 43 mg/dL (ref >39)
LDL Chol Calc (NIH): 54 mg/dL (ref 0–99)
Triglycerides: 226 mg/dL — ABNORMAL HIGH (ref 0–149)
VLDL Cholesterol Cal: 36 mg/dL (ref 5–40)

## 2021-06-20 LAB — CARDIOVASCULAR RISK ASSESSMENT

## 2021-06-20 LAB — CBC WITH DIFF/PLATELET
Basophils Absolute: 0.1 10*3/uL (ref 0.0–0.2)
Basos: 0 %
EOS (ABSOLUTE): 0.1 10*3/uL (ref 0.0–0.4)
Eos: 1 %
Hematocrit: 47.2 % — ABNORMAL HIGH (ref 34.0–46.6)
Hemoglobin: 14.3 g/dL (ref 11.1–15.9)
Immature Grans (Abs): 0.1 10*3/uL (ref 0.0–0.1)
Immature Granulocytes: 1 %
Lymphocytes Absolute: 8.8 10*3/uL — ABNORMAL HIGH (ref 0.7–3.1)
Lymphs: 53 %
MCH: 24.7 pg — ABNORMAL LOW (ref 26.6–33.0)
MCHC: 30.3 g/dL — ABNORMAL LOW (ref 31.5–35.7)
MCV: 82 fL (ref 79–97)
Monocytes Absolute: 0.9 10*3/uL (ref 0.1–0.9)
Monocytes: 5 %
Neutrophils Absolute: 6.7 10*3/uL (ref 1.4–7.0)
Neutrophils: 40 %
Platelets: 386 10*3/uL (ref 150–450)
RBC: 5.79 x10E6/uL — ABNORMAL HIGH (ref 3.77–5.28)
RDW: 19.3 % — ABNORMAL HIGH (ref 11.7–15.4)
WBC: 16.6 10*3/uL — ABNORMAL HIGH (ref 3.4–10.8)

## 2021-06-20 LAB — HEMOGLOBIN A1C
Est. average glucose Bld gHb Est-mCnc: 280 mg/dL
Hgb A1c MFr Bld: 11.4 % — ABNORMAL HIGH (ref 4.8–5.6)

## 2021-06-20 MED ORDER — LEVEMIR FLEXPEN 100 UNIT/ML ~~LOC~~ SOPN: 20.0000 [IU] | PEN_INJECTOR | Freq: Every day | SUBCUTANEOUS | 11 refills | Status: AC

## 2021-06-20 NOTE — Progress Notes (Signed)
? ?Subjective:  ?Patient ID: Stacy [name], female    DOB: [birth year]  Age: 88 y.o. MRN: [id number] ? ?Chief Complaint  ?Patient presents with  ? Hyperlipidemia  ? Atrial Fibrillation  ? Diabetes  ? ? ?HPI: Chronic visit ?  ?Diabetes: Patient is taking Jardiance 10 mg daily. Last A1c 11.4%. eGFR 21 with stage 4 renal failure.  Stop jardiance.  Start levemir 20 units a day and check sugar ? ?Liver Carcinoma: Prednisone 10 mg daily. She is showing symptoms and lost 4 more lbs.  She is in wheel chair now with increased weakness. ? ?Hyperlipidemia: She takes rosuvastatin 10 mg daily. ?Patient presents with hyperlipidemia.  Compliance with treatment has been good; patient takes medicines as directed, maintains low cholesterol diet, follows up as directed, and maintains exercise regimen.  Patient is using crestor 65m without problems.  ? ?GERD: Taking Omeprazole 20 mg daily. ?Current Outpatient Medications on File Prior to Visit  ?Medication Sig Dispense Refill  ? ACCU-CHEK SMARTVIEW test strip 1 each by Other route as needed for other (blood sugar test strips).   11  ? amoxicillin (AMOXIL) 250 MG capsule Take 250 mg by mouth 2 (two) times daily.    ? apixaban (ELIQUIS) 2.5 MG TABS tablet Take 1 tablet (2.5 mg total) by mouth 2 (two) times daily. 180 tablet 3  ? cetirizine (ZYRTEC) 10 MG tablet TAKE 1 TABLET BY MOUTH EVERY DAY 90 tablet 2  ? diphenhydrAMINE (BENADRYL) 25 MG tablet Take 25 mg by mouth every 6 (six) hours as needed.    ? Famotidine-Ca Carb-Mag Hydrox (PEPCID COMPLETE PO) Take by mouth.    ? fluconazole (DIFLUCAN) 100 MG tablet Take 100 mg by mouth daily.    ? linaclotide (LINZESS) 72 MCG capsule Take 72 mcg by mouth daily. As needed    ? loperamide (IMODIUM A-D) 2 MG tablet Take 2 mg by mouth 4 (four) times daily as needed for diarrhea or loose stools.    ? magic mouthwash SOLN Take 5-10 mLs by mouth 4 (four) times daily as needed for mouth pain. Swish and spit 240 mL 1  ? metoprolol succinate (TOPROL XL) 25  MG 24 hr tablet Take 1 and 1/2 tablets (37.562m by mouth twice daily 270 tablet 3  ? mometasone (ELOCON) 0.1 % cream mometasone 0.1 % topical cream ? APPLY TO AFFECTED AREA EVERY DAY AS NEEDED ITCHING    ? NEOMYCIN-POLYMYXIN-HYDROCORTISONE (CORTISPORIN) 1 % SOLN otic solution Place 2 drops into both ears as directed.  3  ? nitroGLYCERIN (NITROSTAT) 0.4 MG SL tablet Place 1 tablet (0.4 mg total) under the tongue every 5 (five) minutes as needed for chest pain. 25 tablet 3  ? omeprazole (PRILOSEC) 20 MG capsule Take 20 mg by mouth daily.    ? ondansetron (ZOFRAN ODT) 4 MG disintegrating tablet Take 1 tablet (4 mg total) by mouth every 8 (eight) hours as needed for nausea or vomiting. 30 tablet 3  ? predniSONE (DELTASONE) 10 MG tablet Take 1 tablet (10 mg total) by mouth daily with breakfast. 60 tablet 1  ? rosuvastatin (CRESTOR) 10 MG tablet TAKE 1 TABLET BY MOUTH EVERY DAY 90 tablet 3  ? sertraline (ZOLOFT) 50 MG tablet TAKE 1 TABLET BY MOUTH EVERY DAY 90 tablet 2  ? traMADol (ULTRAM) 50 MG tablet Take 0.5-1 tablets (25-50 mg total) by mouth every 12 (twelve) hours as needed. 30 tablet 0  ? ?No current facility-administered medications on file prior to visit.  ? ?Past Medical History:  ?  Diagnosis Date  ? Anxiety   ? Hyperlipidemia   ? ?Past Surgical History:  ?Procedure Laterality Date  ? 3 epidural steroid injections    ? APPENDECTOMY    ? Basal Cell Cancer Removal    ? forehead  ? CATARACT EXTRACTION    ? CATARACT EXTRACTION  12/29/2003  ? CORONARY ANGIOPLASTY  03-15-1995  ? CORONARY ANGIOPLASTY WITH STENT PLACEMENT  05-19-1998  ? CORONARY STENT PLACEMENT  2000  ? DILATION AND CURETTAGE OF UTERUS    ? ENDARTERECTOMY Right 04/13/2015  ? Procedure: ENDARTERECTOMY CAROTID;  Surgeon: Serafina Mitchell, MD;  Location: [facility];  Service: Vascular;  Laterality: Right;  ? ENDOMETRIAL BIOPSY    ? EYE SURGERY    ? heart catherization    ? HEMORRHOID SURGERY    ? INCISIONAL HERNIA REPAIR    ? INGUINAL HERNIA REPAIR    ? LOOP  RECORDER INSERTION N/A 06/24/2016  ? Procedure: Loop Recorder Insertion;  Surgeon: Deboraha Sprang, MD;  Location: [facility];  Service: Cardiovascular;  Laterality: N/A;  ? PATCH ANGIOPLASTY Right 04/13/2015  ? Procedure: PATCH ANGIOPLASTY;  Surgeon: Serafina Mitchell, MD;  Location: [facility];  Service: Vascular;  Laterality: Right;  ? PTCA    ? SQUAMOUS CELL CARCINOMA EXCISION    ? left lower leg  ? TEE WITHOUT CARDIOVERSION N/A 06/24/2016  ? Procedure: TRANSESOPHAGEAL ECHOCARDIOGRAM (TEE);  Surgeon: Dorothy Spark, MD;  Location: [facility];  Service: Cardiovascular;  Laterality: N/A;  ? TONSILLECTOMY    ?  ?Family History  ?Problem Relation Age of Onset  ? Diabetes Father   ? Prostate cancer Father   ?     meta to colon  ? Cancer Father   ?     Prostate  ? Parkinsonism Mother   ? Stroke Paternal Uncle   ? Hypertension Paternal Uncle   ? Heart attack Neg Hx   ? ?Social History  ? ?Socioeconomic History  ? Marital status: Widowed  ?  Spouse name: Not on file  ? Number of children: 1  ? Years of education: Not on file  ? Highest education level: Not on file  ?Occupational History  ? Occupation: Retired  ?  Employer: RETIRED  ?  Comment: [employer]  ?Tobacco Use  ? Smoking status: Former  ?  Packs/day: 1.00  ?  Years: 54.00  ?  Pack years: 54.00  ?  Types: Cigarettes  ?  Quit date: 10/05/1998  ?  Years since quitting: 22.7  ? Smokeless tobacco: Never  ?Vaping Use  ? Vaping Use: Never used  ?Substance and Sexual Activity  ? Alcohol use: Not Currently  ?  Alcohol/week: 0.0 standard drinks  ? Drug use: Never  ? Sexual activity: Not Currently  ?Other Topics Concern  ? Not on file  ?Social History Narrative  ? Not on file  ? ?Social Determinants of Health  ? ?Financial Resource Strain: Not on file  ?Food Insecurity: Not on file  ?Transportation Needs: Not on file  ?Physical Activity: Not on file  ?Stress: Not on file  ?Social Connections: Not on file  ? ? ?Review of Systems  ?Constitutional:  Negative for chills,  fatigue and fever.  ?HENT:  Negative for congestion, ear pain and sore throat.   ?Eyes:  Negative for visual disturbance.  ?Respiratory:  Negative for cough and shortness of breath.   ?Cardiovascular:  Negative for chest pain and palpitations.  ?Gastrointestinal:  Negative for abdominal pain, constipation, diarrhea, nausea  and vomiting.  ?Endocrine: Negative for polydipsia, polyphagia and polyuria.  ?Genitourinary:  Negative for difficulty urinating and dysuria.  ?Musculoskeletal:  Negative for arthralgias, back pain and myalgias.  ?Skin:  Negative for rash.  ?Neurological:  Negative for headaches.  ?Psychiatric/Behavioral:  Negative for dysphoric mood. The patient is not nervous/anxious.   ? ? ?Objective:  ?BP 100/70   Pulse 68   Temp (!) 97.1 ?F (36.2 ?C)   Resp 16   Ht 5' 2"  (1.575 m)   Wt 116 lb (52.6 kg)   SpO2 93%   BMI 21.22 kg/m?  ? ? ?  06/20/2021  ?  3:32 PM 06/14/2021  ?  2:55 PM 06/12/2021  ? 11:51 AM  ?BP/Weight  ?Systolic BP 488 891 694  ?Diastolic BP 70 68 85  ?Wt. (Lbs) 116 119.8   ?BMI 21.22 kg/m2 21.91 kg/m2   ? ? ?Physical Exam ?Vitals reviewed.  ?Constitutional:   ?   General: She is not in acute distress. ?   Appearance: Normal appearance.  ?HENT:  ?   Head: Normocephalic.  ?   Right Ear: Tympanic membrane normal.  ?   Left Ear: Tympanic membrane normal.  ?   Nose: Nose normal.  ?   Mouth/Throat:  ?   Mouth: Mucous membranes are moist.  ?   Pharynx: Oropharynx is clear.  ?Eyes:  ?   Extraocular Movements: Extraocular movements intact.  ?   Conjunctiva/sclera: Conjunctivae normal.  ?   Pupils: Pupils are equal, round, and reactive to light.  ?Cardiovascular:  ?   Rate and Rhythm: Normal rate and regular rhythm.  ?   Pulses: Normal pulses.  ?   Heart sounds: Normal heart sounds. No murmur heard. ?Pulmonary:  ?   Effort: Pulmonary effort is normal. No respiratory distress.  ?   Breath sounds: Normal breath sounds. No wheezing.  ?Abdominal:  ?   General: Abdomen is flat. Bowel sounds are normal.  There is no distension.  ?   Tenderness: There is no abdominal tenderness.  ?Musculoskeletal:     ?   General: Normal range of motion.  ?   Cervical back: Normal range of motion and neck supple.  ?Skin: ?   General

## 2021-06-20 NOTE — Progress Notes (Signed)
Glucose 225, creatinine elevated 2.11, eGFR stage 4.high  needs nephrology consult acid phos, and liver tests due to liver cancer, A1c 11.4, triglycerides high 226, wbc 16.6 patient on prednisone and chemotherapy ?lp

## 2021-06-21 ENCOUNTER — Telehealth: Payer: Self-pay

## 2021-06-21 NOTE — Progress Notes (Signed)
I have tried to reach pt several times to follow up to see if they still want to be enrolled in CCM services. Pt never has been established. Unable to reach them. ? ?Stacy [name], CMA ?Clinical Pharmacist Assistant  ?[phone]  ?

## 2021-06-22 ENCOUNTER — Other Ambulatory Visit: Payer: Self-pay

## 2021-06-22 DIAGNOSIS — E1169 Type 2 diabetes mellitus with other specified complication: Secondary | ICD-10-CM

## 2021-06-22 MED ORDER — PEN NEEDLES 31G X 5 MM MISC: 1.0000 | Freq: Every day | 1 refills | Status: AC

## 2021-06-30 ENCOUNTER — Other Ambulatory Visit: Payer: Self-pay | Admitting: Oncology

## 2021-07-03 ENCOUNTER — Encounter: Payer: Self-pay | Admitting: *Deleted

## 2021-07-03 ENCOUNTER — Inpatient Hospital Stay: Payer: Medicare Other

## 2021-07-03 ENCOUNTER — Encounter: Payer: Self-pay | Admitting: Nurse Practitioner

## 2021-07-03 ENCOUNTER — Telehealth: Payer: Self-pay | Admitting: *Deleted

## 2021-07-03 ENCOUNTER — Inpatient Hospital Stay (HOSPITAL_BASED_OUTPATIENT_CLINIC_OR_DEPARTMENT_OTHER): Payer: Medicare Other | Admitting: Nurse Practitioner

## 2021-07-03 ENCOUNTER — Telehealth: Payer: Self-pay

## 2021-07-03 VITALS — BP 116/72 | HR 69 | Temp 98.1°F | Resp 18 | Wt 112.8 lb

## 2021-07-03 DIAGNOSIS — I4891 Unspecified atrial fibrillation: Secondary | ICD-10-CM | POA: Diagnosis not present

## 2021-07-03 DIAGNOSIS — Z5112 Encounter for antineoplastic immunotherapy: Secondary | ICD-10-CM | POA: Diagnosis not present

## 2021-07-03 DIAGNOSIS — I2510 Atherosclerotic heart disease of native coronary artery without angina pectoris: Secondary | ICD-10-CM | POA: Diagnosis not present

## 2021-07-03 DIAGNOSIS — E119 Type 2 diabetes mellitus without complications: Secondary | ICD-10-CM | POA: Diagnosis not present

## 2021-07-03 DIAGNOSIS — R634 Abnormal weight loss: Secondary | ICD-10-CM | POA: Diagnosis not present

## 2021-07-03 DIAGNOSIS — C220 Liver cell carcinoma: Secondary | ICD-10-CM | POA: Diagnosis not present

## 2021-07-03 LAB — CMP (CANCER CENTER ONLY)
ALT: 544 U/L (ref 0–44)
AST: 215 U/L (ref 15–41)
Albumin: 3.2 g/dL — ABNORMAL LOW (ref 3.5–5.0)
Alkaline Phosphatase: 188 U/L — ABNORMAL HIGH (ref 38–126)
Anion gap: 13 (ref 5–15)
BUN: 27 mg/dL — ABNORMAL HIGH (ref 8–23)
CO2: 21 mmol/L — ABNORMAL LOW (ref 22–32)
Calcium: 8.7 mg/dL — ABNORMAL LOW (ref 8.9–10.3)
Chloride: 105 mmol/L (ref 98–111)
Creatinine: 2.14 mg/dL — ABNORMAL HIGH (ref 0.44–1.00)
GFR, Estimated: 21 mL/min — ABNORMAL LOW (ref >60)
Glucose, Bld: 141 mg/dL — ABNORMAL HIGH (ref 70–99)
Potassium: 4.3 mmol/L (ref 3.5–5.1)
Sodium: 139 mmol/L (ref 135–145)
Total Bilirubin: 1.2 mg/dL (ref 0.3–1.2)
Total Protein: 6.3 g/dL — ABNORMAL LOW (ref 6.5–8.1)

## 2021-07-03 NOTE — Progress Notes (Signed)
Patient seen by Ned Card NP today  Vitals are within treatment parameters.  Labs reviewed by Ned Card NP and are not all within treatment parameters. AST 215, ALT 544, creatinine 2.14  Per physician team, patient will not be receiving treatment today.   CRITICAL VALUE STICKER  CRITICAL VALUE: AST 215 and ALT 544  RECEIVER (on-site recipient of call):Toretto Tingler,RN  DATE & TIME NOTIFIED: 07/03/21 @ 1117  MESSENGER (representative from lab):Tarry Kos  MD NOTIFIED: Ned Card, NP  TIME OF NOTIFICATION:1119  RESPONSE: Seen in office. HOld tx

## 2021-07-03 NOTE — Telephone Encounter (Signed)
Called over to [facility] imaging to schedule the patient a CT scan. Lee in scheduler stated she will call the patient to get her schedule for the Ct scan.

## 2021-07-03 NOTE — Progress Notes (Signed)
  [facility] OFFICE PROGRESS NOTE   Diagnosis: Hepatocellular carcinoma  INTERVAL HISTORY:   Stacy [name] returns as scheduled.  She continues prednisone 10 mg daily.  No leg swelling.  She has lost some weight.  Objective:  Vital signs in last 24 hours:  Blood pressure 116/72, pulse 69, temperature 98.1 F (36.7 C), temperature source Tympanic, resp. rate 18, weight 112 lb 12.8 oz (51.2 kg), SpO2 99 %.    HEENT: No thrush. Resp: Diminished breath sounds right lower lung field.  No respiratory distress. Cardio: Regular rate and rhythm. GI: No hepatosplenomegaly. Vascular: No leg edema. Skin: No rash.   Lab Results:  Lab Results  Component Value Date   WBC 16.6 (H) 06/19/2021   HGB 14.3 06/19/2021   HCT 47.2 (H) 06/19/2021   MCV 82 06/19/2021   PLT 386 06/19/2021   NEUTROABS 6.7 06/19/2021    Imaging:  No results found.  Medications: I have reviewed the patient's current medications.  Assessment/Plan: Multiple liver masses CT abdomen/pelvis 07/11/2018, low-attenuation liver lesions incompletely characterized without contrast Ultrasound abdomen 07/10/2020-innumerable incompletely assessed hypoechoic masses in the liver MRI abdomen 07/29/2020- innumerable hypervascular liver masses, nodular liver contour, 1.5 cm left adrenal mass Biopsy liver lesion 08/08/2020-hepatocellular carcinoma, moderately differentiated Cycle 1 atezolizumab/bevacizumab 08/18/2020 Cycle 2 atezolizumab/bevacizumab 09/07/2020 Cycle 3 atezolizumab/bevacizumab 09/28/2020 Cycle 4 atezolizumab/bevacizumab 10/19/2020 MRI liver 11/07/2020-numerous hyperenhancing liver masses-stable to mildly improved compared to the MRI 07/29/2020 Cycle 5 atezolizumab/bevacizumab 11/10/2020 Cycle 6 atezolizumab/bevacizumab 12/04/2020 Cycle 7 atezolizumab/bevacizumab 12/26/2020 Cycle 8 atezolizumab/bevacizumab 01/16/2021 Cycle 9 atezolizumab/bevacizumab 02/06/2021 Cycle 10 atezolizumab/bevacizumab  02/26/2021 03/20/2021 MRI abdomen-right greater than left multifocal hepatocellular carcinoma, similar to exam 11/07/2020; new small right pleural effusion. Cycle 11 atezolizumab/bevacizumab 03/22/2021 Cycle 12 atezolizumab/bevacizumab 04/12/2021 HELD due to increased LFTs Cycle 13 bevacizumab 05/02/2021, atezolizumab held secondary to elevated liver enzymes Cycle 14 bevacizumab 05/23/2021, atezolizumab held secondary to elevated liver enzymes Cycle 15 bevacizumab 06/12/2021, atezolizumab held secondary to elevated liver enzymes Treatment held 07/03/2021 due to progressive rise in liver enzymes, referred for restaging MRI Coronary artery disease Atrial fibrillation-maintained on anticoagulation History of basal cell and squamous cell skin cancers Diabetes Anorexia/weight loss Abdominal pain Urinary tract infection 08/30/2020    Disposition: Stacy [name] appears unchanged.  She has most recently been on active treatment with bevacizumab.  Atezolizumab has been on hold secondary to elevated liver enzymes.  The liver enzymes returned higher today.  Treatment is being held and we are referring her for a restaging MRI.  She will return for follow-up in about 10 days to review results.  We are available to see her sooner if needed.    Ned Card ANP/GNP-BC   07/03/2021  11:42 AM

## 2021-07-03 NOTE — Telephone Encounter (Signed)
[facility] was unwilling to schedule MRI with RN since patient was not present. Called daughter and left VM to call [facility] to schedule scan. Managed care notified to start PA process.

## 2021-07-05 ENCOUNTER — Encounter: Payer: Self-pay | Admitting: Nurse Practitioner

## 2021-07-05 ENCOUNTER — Other Ambulatory Visit: Payer: Self-pay | Admitting: *Deleted

## 2021-07-05 DIAGNOSIS — C220 Liver cell carcinoma: Secondary | ICD-10-CM

## 2021-07-05 NOTE — Progress Notes (Signed)
Daughter reports [facility] unable to schedule MRI by date MD requested. Wishes to change location. Orders placed for [facility]. Scheduled for 07/08/21 @ 0830/0900. NPO 4 hours prior. Daughter notified and OK with this date/time.

## 2021-07-08 ENCOUNTER — Ambulatory Visit (HOSPITAL_COMMUNITY)
Admission: RE | Admit: 2021-07-08 | Discharge: 2021-07-08 | Disposition: A | Discharge status: Home / Self Care | Payer: Medicare Other | Source: Ambulatory Visit | Attending: Oncology | Admitting: Oncology

## 2021-07-08 DIAGNOSIS — K8020 Calculus of gallbladder without cholecystitis without obstruction: Secondary | ICD-10-CM | POA: Diagnosis not present

## 2021-07-08 DIAGNOSIS — K7689 Other specified diseases of liver: Secondary | ICD-10-CM | POA: Diagnosis not present

## 2021-07-08 DIAGNOSIS — C220 Liver cell carcinoma: Secondary | ICD-10-CM | POA: Diagnosis not present

## 2021-07-08 DIAGNOSIS — K828 Other specified diseases of gallbladder: Secondary | ICD-10-CM | POA: Diagnosis not present

## 2021-07-08 MED ORDER — GADOBUTROL 1 MMOL/ML IV SOLN
6.0000 mL | Freq: Once | INTRAVENOUS | Status: AC | PRN
  Administered 2021-07-08: 6 mL via INTRAVENOUS

## 2021-07-12 ENCOUNTER — Inpatient Hospital Stay: Payer: Medicare Other | Attending: Nurse Practitioner | Admitting: Nurse Practitioner

## 2021-07-12 ENCOUNTER — Encounter: Payer: Self-pay | Admitting: Nurse Practitioner

## 2021-07-12 VITALS — BP 109/73 | HR 95 | Temp 97.9°F | Resp 18 | Ht 62.0 in | Wt 118.0 lb

## 2021-07-12 DIAGNOSIS — E119 Type 2 diabetes mellitus without complications: Secondary | ICD-10-CM | POA: Diagnosis not present

## 2021-07-12 DIAGNOSIS — Z87440 Personal history of urinary (tract) infections: Secondary | ICD-10-CM | POA: Diagnosis not present

## 2021-07-12 DIAGNOSIS — C220 Liver cell carcinoma: Secondary | ICD-10-CM | POA: Diagnosis not present

## 2021-07-12 DIAGNOSIS — Z5112 Encounter for antineoplastic immunotherapy: Secondary | ICD-10-CM | POA: Insufficient documentation

## 2021-07-12 DIAGNOSIS — I4891 Unspecified atrial fibrillation: Secondary | ICD-10-CM | POA: Insufficient documentation

## 2021-07-12 DIAGNOSIS — R634 Abnormal weight loss: Secondary | ICD-10-CM | POA: Insufficient documentation

## 2021-07-12 DIAGNOSIS — I2510 Atherosclerotic heart disease of native coronary artery without angina pectoris: Secondary | ICD-10-CM | POA: Diagnosis not present

## 2021-07-12 DIAGNOSIS — R630 Anorexia: Secondary | ICD-10-CM | POA: Insufficient documentation

## 2021-07-12 DIAGNOSIS — Z85828 Personal history of other malignant neoplasm of skin: Secondary | ICD-10-CM | POA: Insufficient documentation

## 2021-07-12 NOTE — Progress Notes (Addendum)
Stacy [name] OFFICE PROGRESS NOTE   Diagnosis: Hepatocellular carcinoma  INTERVAL HISTORY:   Stacy [name] returns as scheduled.  Treatment was held last office visit due to further elevation in liver enzymes and she was referred for a restaging MRI.  She overall is feeling well.  She has a good appetite.  She is gaining weight.  No abdominal pain.  No nausea or vomiting.  No diarrhea.  Objective:  Vital signs in last 24 hours:  Blood pressure 109/73, pulse 95, temperature 97.9 F (36.6 C), temperature source Oral, resp. rate 18, height '5\' 2"'$  (1.575 m), weight 118 lb (53.5 kg), SpO2 98 %.    HEENT: No thrush or ulcers. Resp: Lungs clear bilaterally. Cardio: Regular rate and rhythm. GI: Abdomen soft and nontender.  No hepatomegaly.  No apparent ascites. Vascular: No leg edema.   Lab Results:  Lab Results  Component Value Date   WBC 16.6 (H) 06/19/2021   HGB 14.3 06/19/2021   HCT 47.2 (H) 06/19/2021   MCV 82 06/19/2021   PLT 386 06/19/2021   NEUTROABS 6.7 06/19/2021    Imaging:  No results found.  Medications: I have reviewed the patient's current medications.  Assessment/Plan: Multiple liver masses CT abdomen/pelvis 07/11/2018, low-attenuation liver lesions incompletely characterized without contrast Ultrasound abdomen 07/10/2020-innumerable incompletely assessed hypoechoic masses in the liver MRI abdomen 07/29/2020- innumerable hypervascular liver masses, nodular liver contour, 1.5 cm left adrenal mass Biopsy liver lesion 08/08/2020-hepatocellular carcinoma, moderately differentiated Cycle 1 atezolizumab/bevacizumab 08/18/2020 Cycle 2 atezolizumab/bevacizumab 09/07/2020 Cycle 3 atezolizumab/bevacizumab 09/28/2020 Cycle 4 atezolizumab/bevacizumab 10/19/2020 MRI liver 11/07/2020-numerous hyperenhancing liver masses-stable to mildly improved compared to the MRI 07/29/2020 Cycle 5 atezolizumab/bevacizumab 11/10/2020 Cycle 6 atezolizumab/bevacizumab 12/04/2020 Cycle  7 atezolizumab/bevacizumab 12/26/2020 Cycle 8 atezolizumab/bevacizumab 01/16/2021 Cycle 9 atezolizumab/bevacizumab 02/06/2021 Cycle 10 atezolizumab/bevacizumab 02/26/2021 03/20/2021 MRI abdomen-right greater than left multifocal hepatocellular carcinoma, similar to exam 11/07/2020; new small right pleural effusion. Cycle 11 atezolizumab/bevacizumab 03/22/2021 Cycle 12 atezolizumab/bevacizumab 04/12/2021 HELD due to increased LFTs Cycle 13 bevacizumab 05/02/2021, atezolizumab held secondary to elevated liver enzymes Cycle 14 bevacizumab 05/23/2021, atezolizumab held secondary to elevated liver enzymes Cycle 15 bevacizumab 06/12/2021, atezolizumab held secondary to elevated liver enzymes Treatment held 07/03/2021 due to progressive rise in liver enzymes, referred for restaging MRI 07/08/2021 MRI abdomen-progression of widespread multifocal intrahepatic hepatocellular carcinoma, developing hepatic vein and IVC invasion and/or tumor thrombus. Coronary artery disease Atrial fibrillation-maintained on anticoagulation History of basal cell and squamous cell skin cancers Diabetes Anorexia/weight loss Abdominal pain Urinary tract infection 08/30/2020  Disposition: Stacy [name] appears stable.  We discussed the recent MRI which shows evidence of disease progression.  Dr. Benay [name] discussed options to include supportive care versus a trial of lenvatinib.  She would like to continue treatment.  We reviewed potential toxicities associated with lenvatinib including diarrhea, rash, hypertension.  She was provided with printed information as well.  The [facility] pharmacist will contact her and her daughter for additional discussion.  Anticipate she will begin lenvatinib next week.  We will see her in follow-up the week of 08/28/2021.  We are available to see her sooner if needed.  Patient seen with Dr. Benay [name].    Stacy [name] ANP/GNP-BC   07/12/2021  1:37 PM This was a shared visit with Stacy [name].  We discussed the  restaging MRI findings with Stacy [name] and her daughter.  We discussed treatment options.  There is evidence of disease progression following treatment with atezolizumab/bevacizumab.  Atezolizumab has been on hold for the past several months secondary to  elevation of the liver enzymes.  The liver enzymes remain elevated.  I do not recommend repeat treatment with atezolizumab.  We discussed comfort/supportive care versus a trial of salvage systemic therapy.  We discussed tyrosine kinase inhibitor therapy.  I recommend a trial of lenvatinib.  We reviewed potential toxicities associated with lenvatinib.  She agrees to proceed.  The plan is to begin treatment next week.  I was present for greater than 50% of today's visit.  I performed medical decision making.  Stacy [name]

## 2021-07-13 ENCOUNTER — Encounter: Payer: Self-pay | Admitting: Oncology

## 2021-07-13 MED ORDER — LENVIMA (4 MG DAILY DOSE) 4 MG PO CPPK
4.0000 mg | ORAL_CAPSULE | Freq: Every day | ORAL | 0 refills | Status: DC
Start: 2021-07-19 — End: 2021-08-08
  Filled 2021-07-13 – 2021-07-18 (×2): qty 30, 30d supply, fill #0

## 2021-07-14 ENCOUNTER — Other Ambulatory Visit (HOSPITAL_COMMUNITY): Payer: Self-pay

## 2021-07-16 ENCOUNTER — Telehealth: Payer: Self-pay | Admitting: Pharmacy Technician

## 2021-07-16 ENCOUNTER — Telehealth: Payer: Self-pay | Admitting: *Deleted

## 2021-07-16 ENCOUNTER — Other Ambulatory Visit (HOSPITAL_COMMUNITY): Payer: Self-pay

## 2021-07-16 ENCOUNTER — Telehealth: Payer: Self-pay | Admitting: Pharmacist

## 2021-07-16 NOTE — Telephone Encounter (Signed)
Oral Oncology Pharmacy Student Encounter  Received new prescription for Lenvima (lenvatinib) for the treatment of [place], planned duration until disease progression or unacceptable drug toxicity.  CMP and CBC w/diff from 07/03/21 assessed, no relevant lab abnormalities. Prescription dose and frequency assessed.   Current medication list in Epic reviewed, 1 DDI with lenvatinib identified: - Additive QT interval prolongation may occur during coadministration of loperamide and Lenvatinib, monitor QTc interval and for ventricular arrhythmias.  Evaluated chart and no patient barriers to medication adherence identified.   Prescription has been e-scribed to the [facility] for benefits analysis and approval.  Oral Oncology Clinic will continue to follow for insurance authorization, copayment issues, initial counseling and start date.  Patient agreed to treatment on 07/12/21 per MD documentation.  Garrel Ridgel, PharmD Candidate [place]  [place]/DB/AP Oral [facility] [phone]  07/16/2021 12:21 PM

## 2021-07-16 NOTE — Telephone Encounter (Signed)
Informed daughter that the incorrect drug sheet was given to her. She needed the lenvatinib information. Emailed the correct information to her and made her aware that it is in the PA process now.

## 2021-07-16 NOTE — Telephone Encounter (Signed)
Oral Oncology Patient Advocate Encounter  Prior Authorization for Stacy [name] has been approved.    PA# [id number] Effective dates: 02/11/21 through 02/10/22  Patients co-pay is $10.35  Oral Oncology Clinic will continue to follow.   Stacy [name] Phone [phone] Fax [phone] 07/16/2021 4:18 PM

## 2021-07-16 NOTE — Telephone Encounter (Signed)
Oral Oncology Patient Advocate Encounter   Received notification from [facility] that prior authorization for Stacy [name] is required.   PA submitted on CoverMyMeds Key [id number] Status is pending   Oral Oncology Clinic will continue to follow.  [place] Patient [place] Phone [phone] Fax [phone] 07/16/2021 4:17 PM

## 2021-07-18 ENCOUNTER — Other Ambulatory Visit (HOSPITAL_COMMUNITY): Payer: Self-pay

## 2021-07-18 ENCOUNTER — Telehealth: Payer: Self-pay | Admitting: Pharmacy Technician

## 2021-07-18 ENCOUNTER — Encounter: Payer: Self-pay | Admitting: Oncology

## 2021-07-18 NOTE — Telephone Encounter (Signed)
Oral Oncology Patient Advocate Encounter   Was successful in securing patient a $10,000 grant from [facility] [place] (PAF) to provide copayment coverage for Lenvima.  This will keep the out of pocket expense at $0.     I have spoken with the patients daughter, Stacy [name].    The billing information is as follows and has been shared with [place].   RxBin: [id number] PCN:  [id number] Member ID: [id number] Group ID: [id number] Dates of Eligibility: 01/19/21 through 07/18/22  Fund: [place] Patient [place] Phone [phone] Fax [phone] 07/18/2021 10:43 AM

## 2021-07-18 NOTE — Telephone Encounter (Signed)
Oral Oncology Patient Advocate Encounter  I spoke with patients daughter, Rodena Piety, this morning to schedule pick up of Lenvima.  Michel Santee will be filled through [facility] and Rodena Piety will pick up on 07/19/21.    [facility] will call 7-10 days before next refill is due to complete adherence call and set up delivery of medication.     [place] Patient [place] Phone [phone] Fax [phone] 07/18/2021 3:51 PM

## 2021-07-18 NOTE — Telephone Encounter (Signed)
Oral Chemotherapy Pharmacist Encounter  Patient's daughter will pick up Lenvima from the pharmacy tomorrow 07/19/21.  Patient Education I spoke with patient's daughter Stacy [name] for overview of new oral chemotherapy medication: Lenvima (lenvatinib) for the treatment of progressive [place], planned duration until disease progression or unacceptable drug toxicity.   Counseled [name] on administration, dosing, side effects, monitoring, drug-food interactions, safe handling, storage, and disposal. Patient will take 1 capsule (4 mg total) by mouth daily.  Side effects include but not limited to: diarrhea, nausea, fatigue, hand-foot syndrome, mouth sores, hypertension. Hypertension: Stacy [name] plans on picking up a blood pressure cuff so they can monitor blood pressure at home. Reviewed s/sx of hypertension HFS: recommended the use of [place] Smooth Extra Care 20, Stacy [name] will pick this up from the pharmacy Diarrhea: they already have loperamide at home to use as needed. Stacy [name] knows to call if her mom is having 4 or more loose stools per day Mouth sores: Stacy [name] reports having magic mouth was at home already. I asked her to make sure it was still in date  Reviewed with Stacy [name] importance of keeping a medication schedule and plan for any missed doses.  After discussion with Stacy [name] no patient barriers to medication adherence identified.   Stacy [name] voiced understanding and appreciation. All questions answered. Medication handout provided.  Provided Stacy [name] with Oral Chemotherapy Navigation Clinic phone number. Stacy [name] knows to call the office with questions or concerns. Oral Chemotherapy Navigation Clinic will continue to follow.  [name], PharmD, BCPS, BCOP, CPP Hematology/Oncology Clinical Pharmacist Practitioner [place]/DB/AP Oral [facility] [phone]  07/18/2021 9:57 AM

## 2021-07-19 ENCOUNTER — Other Ambulatory Visit (HOSPITAL_COMMUNITY): Payer: Self-pay

## 2021-07-22 ENCOUNTER — Other Ambulatory Visit: Payer: Self-pay | Admitting: Oncology

## 2021-07-25 ENCOUNTER — Ambulatory Visit (INDEPENDENT_AMBULATORY_CARE_PROVIDER_SITE_OTHER): Payer: Medicare Other | Admitting: Legal Medicine

## 2021-07-25 ENCOUNTER — Encounter: Payer: Self-pay | Admitting: Legal Medicine

## 2021-07-25 ENCOUNTER — Ambulatory Visit: Payer: Medicare Other | Admitting: Legal Medicine

## 2021-07-25 VITALS — BP 90/60 | HR 105 | Temp 97.5°F | Resp 15 | Ht 62.0 in | Wt 118.0 lb

## 2021-07-25 DIAGNOSIS — E1121 Type 2 diabetes mellitus with diabetic nephropathy: Secondary | ICD-10-CM

## 2021-07-25 DIAGNOSIS — B370 Candidal stomatitis: Secondary | ICD-10-CM | POA: Diagnosis not present

## 2021-07-25 DIAGNOSIS — N184 Chronic kidney disease, stage 4 (severe): Secondary | ICD-10-CM

## 2021-07-25 DIAGNOSIS — N178 Other acute kidney failure: Secondary | ICD-10-CM | POA: Diagnosis not present

## 2021-07-25 MED ORDER — FLUCONAZOLE 100 MG PO TABS: 100.0000 mg | ORAL_TABLET | Freq: Every day | ORAL | 0 refills | Status: AC

## 2021-07-25 NOTE — Progress Notes (Signed)
Subjective:  Patient ID: Stacy [name], female    DOB: Jul 01, 1926  Age: 88 y.o. MRN: [id number]  Chief Complaint  Patient presents with   Diabetes    HPI: follow up  Patient is here for follow up for diabetes. She started Insulin levemir flexpen 4 units at bedtime.  Her daughter's mentioned that her blood sugar has been 90's mg/dl. A1c was high and patient started on levemir one injection per day.  They had to cut to 4 units to avoid hypoglycemia.  She also has sores on her mouth. Current Outpatient Medications on File Prior to Visit  Medication Sig Dispense Refill   ACCU-CHEK SMARTVIEW test strip 1 each by Other route daily.  11   apixaban (ELIQUIS) 2.5 MG TABS tablet Take 1 tablet (2.5 mg total) by mouth 2 (two) times daily. 180 tablet 3   cetirizine (ZYRTEC) 10 MG tablet TAKE 1 TABLET BY MOUTH EVERY DAY 90 tablet 2   diphenhydrAMINE (BENADRYL) 25 MG tablet Take 25 mg by mouth every 6 (six) hours as needed.     Famotidine-Ca Carb-Mag Hydrox (PEPCID COMPLETE PO) Take by mouth.     insulin detemir (LEVEMIR FLEXPEN) 100 UNIT/ML FlexPen Inject 20 Units into the skin daily. (Patient taking differently: Inject 4 Units into the skin at bedtime.) 15 mL 11   Insulin Pen Needle (PEN NEEDLES) 31G X 5 MM MISC 1 each by Does not apply route daily in the afternoon. 100 each 1   lenvatinib 4 mg daily dose (LENVIMA, 4 MG DAILY DOSE,) capsule Take 1 capsule (4 mg total) by mouth daily. 30 capsule 0   linaclotide (LINZESS) 72 MCG capsule Take 72 mcg by mouth daily. As needed     loperamide (IMODIUM A-D) 2 MG tablet Take 2 mg by mouth 4 (four) times daily as needed for diarrhea or loose stools.     metoprolol succinate (TOPROL XL) 25 MG 24 hr tablet Take 1 and 1/2 tablets (37.'5mg'$ ) by mouth twice daily 270 tablet 3   mometasone (ELOCON) 0.1 % cream mometasone 0.1 % topical cream  APPLY TO AFFECTED AREA EVERY DAY AS NEEDED ITCHING     NEOMYCIN-POLYMYXIN-HYDROCORTISONE (CORTISPORIN) 1 % SOLN otic solution  Place 2 drops into both ears as directed.  3   nitroGLYCERIN (NITROSTAT) 0.4 MG SL tablet Place 1 tablet (0.4 mg total) under the tongue every 5 (five) minutes as needed for chest pain. 25 tablet 3   omeprazole (PRILOSEC) 20 MG capsule Take 20 mg by mouth daily.     ondansetron (ZOFRAN ODT) 4 MG disintegrating tablet Take 1 tablet (4 mg total) by mouth every 8 (eight) hours as needed for nausea or vomiting. 30 tablet 3   predniSONE (DELTASONE) 10 MG tablet Take 1 tablet (10 mg total) by mouth daily with breakfast. 60 tablet 1   rosuvastatin (CRESTOR) 10 MG tablet TAKE 1 TABLET BY MOUTH EVERY DAY 90 tablet 3   sertraline (ZOLOFT) 50 MG tablet TAKE 1 TABLET BY MOUTH EVERY DAY 90 tablet 2   traMADol (ULTRAM) 50 MG tablet Take 0.5-1 tablets (25-50 mg total) by mouth every 12 (twelve) hours as needed. 30 tablet 0   No current facility-administered medications on file prior to visit.   Past Medical History:  Diagnosis Date   Anxiety    Hyperlipidemia    Past Surgical History:  Procedure Laterality Date   3 epidural steroid injections     APPENDECTOMY     Basal Cell Cancer Removal  forehead   CATARACT EXTRACTION     CATARACT EXTRACTION  12/29/2003   CORONARY ANGIOPLASTY  03-15-1995   CORONARY ANGIOPLASTY WITH STENT PLACEMENT  05-19-1998   CORONARY STENT PLACEMENT  2000   DILATION AND CURETTAGE OF UTERUS     ENDARTERECTOMY Right 04/13/2015   Procedure: ENDARTERECTOMY CAROTID;  Surgeon: Serafina Mitchell, MD;  Location: [place];  Service: Vascular;  Laterality: Right;   ENDOMETRIAL BIOPSY     EYE SURGERY     heart catherization     HEMORRHOID SURGERY     INCISIONAL HERNIA REPAIR     INGUINAL HERNIA REPAIR     LOOP RECORDER INSERTION N/A 06/24/2016   Procedure: Loop Recorder Insertion;  Surgeon: Deboraha Sprang, MD;  Location: [facility];  Service: Cardiovascular;  Laterality: N/A;   PATCH ANGIOPLASTY Right 04/13/2015   Procedure: PATCH ANGIOPLASTY;  Surgeon: Serafina Mitchell, MD;   Location: [facility] OR;  Service: Vascular;  Laterality: Right;   PTCA     SQUAMOUS CELL CARCINOMA EXCISION     left lower leg   TEE WITHOUT CARDIOVERSION N/A 06/24/2016   Procedure: TRANSESOPHAGEAL ECHOCARDIOGRAM (TEE);  Surgeon: Dorothy Spark, MD;  Location: [facility];  Service: Cardiovascular;  Laterality: N/A;   TONSILLECTOMY      Family History  Problem Relation Age of Onset   Diabetes Father    Prostate cancer Father        meta to colon   Cancer Father        Prostate   Parkinsonism Mother    Stroke Paternal Uncle    Hypertension Paternal Uncle    Heart attack Neg Hx    Social History   Socioeconomic History   Marital status: Widowed    Spouse name: Not on file   Number of children: 1   Years of education: Not on file   Highest education level: Not on file  Occupational History   Occupation: Retired    Fish farm manager: RETIRED    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  Tobacco Use   Smoking status: Former    Packs/day: 1.00    Years: 54.00    Total pack years: 54.00    Types: Cigarettes    Quit date: 10/05/1998    Years since quitting: 22.8   Smokeless tobacco: Never  Vaping Use   Vaping Use: Never used  Substance and Sexual Activity   Alcohol use: Not Currently    Alcohol/week: 0.0 standard drinks of alcohol   Drug use: Never   Sexual activity: Not Currently  Other Topics Concern   Not on file  Social History Narrative   Not on file   Social Determinants of Health   Financial Resource Strain: Not on file  Food Insecurity: Not on file  Transportation Needs: Not on file  Physical Activity: Not on file  Stress: Not on file  Social Connections: Not on file    Review of Systems  Constitutional:  Negative for activity change and appetite change.  HENT:  Positive for sore throat. Negative for congestion.   Eyes:  Negative for visual disturbance.  Respiratory:  Negative for chest tightness and shortness of breath.   Cardiovascular:  Negative for chest pain, palpitations  and leg swelling.  Gastrointestinal:  Negative for abdominal distention and abdominal pain.  Genitourinary:  Negative for difficulty urinating, dyspareunia and dysuria.  Neurological: Negative.  Negative for headaches.  Psychiatric/Behavioral:  Positive for behavioral problems. Negative for agitation.      Objective:  BP 90/60  Pulse (!) 105   Temp (!) 97.5 F (36.4 C)   Resp 15   Ht '5\' 2"'$  (1.575 m)   Wt 118 lb (53.5 kg)   SpO2 99%   BMI 21.58 kg/m      07/25/2021    3:08 PM 07/12/2021    1:33 PM 07/03/2021   10:00 AM  BP/Weight  Systolic BP 90 017 510  Diastolic BP 60 73 72  Wt. (Lbs) 118 118 112.8  BMI 21.58 kg/m2 21.58 kg/m2 20.63 kg/m2    Physical Exam Vitals reviewed.  Constitutional:      General: She is not in acute distress.    Appearance: Normal appearance.  HENT:     Right Ear: Tympanic membrane normal.     Left Ear: Tympanic membrane normal.     Nose: Nose normal.     Mouth/Throat:     Mouth: Mucous membranes are moist.     Pharynx: Oropharynx is clear.  Eyes:     Extraocular Movements: Extraocular movements intact.     Conjunctiva/sclera: Conjunctivae normal.     Pupils: Pupils are equal, round, and reactive to light.  Cardiovascular:     Rate and Rhythm: Normal rate and regular rhythm.     Pulses: Normal pulses.     Heart sounds: Normal heart sounds. No murmur heard.    No gallop.  Pulmonary:     Effort: Pulmonary effort is normal. No respiratory distress.     Breath sounds: Normal breath sounds. No wheezing.  Abdominal:     General: Abdomen is flat. Bowel sounds are normal. There is no distension.     Tenderness: There is no abdominal tenderness.  Musculoskeletal:     Cervical back: Normal range of motion.     Right lower leg: No edema.     Left lower leg: No edema.  Skin:    General: Skin is warm.     Capillary Refill: Capillary refill takes less than 2 seconds.  Neurological:     General: No focal deficit present.     Mental Status: She  is alert and oriented to person, place, and time. Mental status is at baseline.     Gait: Gait normal.     Comments: Wheel chair  Psychiatric:        Mood and Affect: Mood normal.         Lab Results  Component Value Date   WBC 16.6 (H) 06/19/2021   HGB 14.3 06/19/2021   HCT 47.2 (H) 06/19/2021   PLT 386 06/19/2021   GLUCOSE 141 (H) 07/03/2021   CHOL 133 06/19/2021   TRIG 226 (H) 06/19/2021   HDL 43 06/19/2021   LDLCALC 54 06/19/2021   ALT 544 (HH) 07/03/2021   AST 215 (HH) 07/03/2021   NA 139 07/03/2021   K 4.3 07/03/2021   CL 105 07/03/2021   CREATININE 2.14 (H) 07/03/2021   BUN 27 (H) 07/03/2021   CO2 21 (L) 07/03/2021   TSH 2.723 05/02/2021   INR 1.0 08/08/2020   HGBA1C 11.4 (H) 06/19/2021   MICROALBUR 10 06/22/2020      Assessment & Plan:   Problem List Items Addressed This Visit       Endocrine   Diabetic glomerulopathy ([place]) - Primary An individual care plan for diabetes was established and reinforced today.  The patient's status was assessed using clinical findings on exam, labs and diagnostic testing. Patient success at meeting goals based on disease specific evidence-based guidelines and found to be good  controlled. Medications were assessed and patient's understanding of the medical issues , including barriers were assessed. Recommend adherence to a diabetic diet, a graduated exercise program, HgbA1c level is checked quarterly, and urine microalbumin performed yearly .  Annual mono-filament sensation testing performed. Lower blood pressure and control hyperlipidemia is important. Get annual eye exams and annual flu shots and smoking cessation discussed.  Self management goals were discussed.    Other Visit Diagnoses     Stomatitis monilial       Relevant Medications   fluconazole (DIFLUCAN) 100 MG tablet Treat monilia with diflucan    Acute renal failure with other specified pathological kidney lesion superimposed on stage 4 chronic kidney disease  ([place])       Relevant Orders   Ambulatory referral to Nephrology Stage 4 renal disease, refer to nehrology     .    Orders Placed This Encounter  Procedures   Ambulatory referral to Nephrology     Follow-up: Return in about 3 months (around 10/25/2021).  An After Visit Summary was printed and given to the patient.  Reinaldo Meeker, MD [facility] [phone]

## 2021-07-31 ENCOUNTER — Encounter: Payer: Self-pay | Admitting: Nurse Practitioner

## 2021-07-31 ENCOUNTER — Inpatient Hospital Stay (HOSPITAL_BASED_OUTPATIENT_CLINIC_OR_DEPARTMENT_OTHER): Payer: Medicare Other | Admitting: Nurse Practitioner

## 2021-07-31 ENCOUNTER — Other Ambulatory Visit: Payer: Self-pay

## 2021-07-31 ENCOUNTER — Telehealth: Payer: Self-pay | Admitting: *Deleted

## 2021-07-31 ENCOUNTER — Telehealth: Payer: Self-pay

## 2021-07-31 ENCOUNTER — Encounter: Payer: Self-pay | Admitting: *Deleted

## 2021-07-31 ENCOUNTER — Inpatient Hospital Stay: Payer: Medicare Other

## 2021-07-31 ENCOUNTER — Other Ambulatory Visit (HOSPITAL_COMMUNITY): Payer: Self-pay

## 2021-07-31 ENCOUNTER — Other Ambulatory Visit: Payer: Self-pay | Admitting: Nurse Practitioner

## 2021-07-31 VITALS — BP 105/90 | HR 99 | Temp 98.1°F | Resp 18 | Ht 62.0 in | Wt 117.8 lb

## 2021-07-31 DIAGNOSIS — I2510 Atherosclerotic heart disease of native coronary artery without angina pectoris: Secondary | ICD-10-CM | POA: Diagnosis not present

## 2021-07-31 DIAGNOSIS — C220 Liver cell carcinoma: Secondary | ICD-10-CM

## 2021-07-31 DIAGNOSIS — R5383 Other fatigue: Secondary | ICD-10-CM | POA: Diagnosis not present

## 2021-07-31 DIAGNOSIS — E119 Type 2 diabetes mellitus without complications: Secondary | ICD-10-CM | POA: Diagnosis not present

## 2021-07-31 DIAGNOSIS — R110 Nausea: Secondary | ICD-10-CM

## 2021-07-31 DIAGNOSIS — R634 Abnormal weight loss: Secondary | ICD-10-CM | POA: Diagnosis not present

## 2021-07-31 DIAGNOSIS — I4891 Unspecified atrial fibrillation: Secondary | ICD-10-CM | POA: Diagnosis not present

## 2021-07-31 DIAGNOSIS — Z5112 Encounter for antineoplastic immunotherapy: Secondary | ICD-10-CM | POA: Diagnosis not present

## 2021-07-31 LAB — CBC WITH DIFFERENTIAL (CANCER CENTER ONLY)
Abs Immature Granulocytes: 0.08 10*3/uL — ABNORMAL HIGH (ref 0.00–0.07)
Basophils Absolute: 0.1 10*3/uL (ref 0.0–0.1)
Basophils Relative: 0 %
Eosinophils Absolute: 0.1 10*3/uL (ref 0.0–0.5)
Eosinophils Relative: 1 %
HCT: 50.6 % — ABNORMAL HIGH (ref 36.0–46.0)
Hemoglobin: 14.6 g/dL (ref 12.0–15.0)
Immature Granulocytes: 1 %
Lymphocytes Relative: 45 %
Lymphs Abs: 6.7 10*3/uL — ABNORMAL HIGH (ref 0.7–4.0)
MCH: 24.9 pg — ABNORMAL LOW (ref 26.0–34.0)
MCHC: 28.9 g/dL — ABNORMAL LOW (ref 30.0–36.0)
MCV: 86.2 fL (ref 80.0–100.0)
Monocytes Absolute: 1.1 10*3/uL — ABNORMAL HIGH (ref 0.1–1.0)
Monocytes Relative: 7 %
Neutro Abs: 7.1 10*3/uL (ref 1.7–7.7)
Neutrophils Relative %: 46 %
Platelet Count: 346 10*3/uL (ref 150–400)
RBC: 5.87 MIL/uL — ABNORMAL HIGH (ref 3.87–5.11)
RDW: 21.5 % — ABNORMAL HIGH (ref 11.5–15.5)
WBC Count: 15 10*3/uL — ABNORMAL HIGH (ref 4.0–10.5)
nRBC: 0.7 % — ABNORMAL HIGH (ref 0.0–0.2)

## 2021-07-31 LAB — CMP (CANCER CENTER ONLY)
ALT: 838 U/L (ref 0–44)
AST: 212 U/L (ref 15–41)
Albumin: 3.3 g/dL — ABNORMAL LOW (ref 3.5–5.0)
Alkaline Phosphatase: 177 U/L — ABNORMAL HIGH (ref 38–126)
Anion gap: 14 (ref 5–15)
BUN: 30 mg/dL — ABNORMAL HIGH (ref 8–23)
CO2: 24 mmol/L (ref 22–32)
Calcium: 9.7 mg/dL (ref 8.9–10.3)
Chloride: 101 mmol/L (ref 98–111)
Creatinine: 2.4 mg/dL — ABNORMAL HIGH (ref 0.44–1.00)
GFR, Estimated: 18 mL/min — ABNORMAL LOW (ref >60)
Glucose, Bld: 179 mg/dL — ABNORMAL HIGH (ref 70–99)
Potassium: 5.7 mmol/L — ABNORMAL HIGH (ref 3.5–5.1)
Sodium: 139 mmol/L (ref 135–145)
Total Bilirubin: 1.9 mg/dL — ABNORMAL HIGH (ref 0.3–1.2)
Total Protein: 6.4 g/dL — ABNORMAL LOW (ref 6.5–8.1)

## 2021-07-31 MED ORDER — SODIUM POLYSTYRENE SULFONATE 15 GM/60ML PO SUSP
30.0000 g | Freq: Once | ORAL | 0 refills | Status: AC
  Filled 2021-07-31: qty 120, 1d supply, fill #0

## 2021-07-31 MED ORDER — ONDANSETRON 4 MG PO TBDP: 4.0000 mg | ORAL_TABLET | Freq: Three times a day (TID) | ORAL | 3 refills | Status: AC | PRN

## 2021-07-31 MED ORDER — NYSTATIN 100000 UNIT/ML MT SUSP
OROMUCOSAL | 1 refills | Status: AC
  Filled 2021-07-31: qty 240, 6d supply, fill #0

## 2021-07-31 MED ORDER — MAGIC MOUTHWASH: 5.0000 mL | Freq: Four times a day (QID) | ORAL | 1 refills | Status: DC | PRN

## 2021-07-31 NOTE — Telephone Encounter (Signed)
Called in MMW at [place]

## 2021-07-31 NOTE — Progress Notes (Signed)
  [place] OFFICE PROGRESS NOTE   Diagnosis: Hepatocellular carcinoma  INTERVAL HISTORY:   Stacy [name] returns as scheduled.  She began lenvatinib 4 mg daily on 08/08/2021.  She is having some nausea.  No mouth sores though she does note discomfort in her mouth.  She is currently completing a course of Diflucan as prescribed per PCP.  No rash.  No hand or foot pain or redness.  Frequent formed stools.  No watery bowel movements.  Objective:  Vital signs in last 24 hours:  Blood pressure 105/90, pulse 99, temperature 98.1 F (36.7 C), temperature source Oral, resp. rate 18, height '5\' 2"'$  (1.575 m), weight 117 lb 12.8 oz (53.4 kg), SpO2 100 %.    HEENT: No thrush or ulcers. Resp: Lungs clear bilaterally. Cardio: Irregular. GI: Question palpable liver edge, associated tenderness. Vascular: No leg edema. Skin: Palms without erythema.   Lab Results:  Lab Results  Component Value Date   WBC 15.0 (H) 07/31/2021   HGB 14.6 07/31/2021   HCT 50.6 (H) 07/31/2021   MCV 86.2 07/31/2021   PLT 346 07/31/2021   NEUTROABS PENDING 07/31/2021    Imaging:  No results found.  Medications: I have reviewed the patient's current medications.  Assessment/Plan: Multiple liver masses CT abdomen/pelvis 07/11/2018, low-attenuation liver lesions incompletely characterized without contrast Ultrasound abdomen 07/10/2020-innumerable incompletely assessed hypoechoic masses in the liver MRI abdomen 07/29/2020- innumerable hypervascular liver masses, nodular liver contour, 1.5 cm left adrenal mass Biopsy liver lesion 08/08/2020-hepatocellular carcinoma, moderately differentiated Cycle 1 atezolizumab/bevacizumab 08/18/2020 Cycle 2 atezolizumab/bevacizumab 09/07/2020 Cycle 3 atezolizumab/bevacizumab 09/28/2020 Cycle 4 atezolizumab/bevacizumab 10/19/2020 MRI liver 11/07/2020-numerous hyperenhancing liver masses-stable to mildly improved compared to the MRI 07/29/2020 Cycle 5 atezolizumab/bevacizumab  11/10/2020 Cycle 6 atezolizumab/bevacizumab 12/04/2020 Cycle 7 atezolizumab/bevacizumab 12/26/2020 Cycle 8 atezolizumab/bevacizumab 01/16/2021 Cycle 9 atezolizumab/bevacizumab 02/06/2021 Cycle 10 atezolizumab/bevacizumab 02/26/2021 03/20/2021 MRI abdomen-right greater than left multifocal hepatocellular carcinoma, similar to exam 11/07/2020; new small right pleural effusion. Cycle 11 atezolizumab/bevacizumab 03/22/2021 Cycle 12 atezolizumab/bevacizumab 04/12/2021 HELD due to increased LFTs Cycle 13 bevacizumab 05/02/2021, atezolizumab held secondary to elevated liver enzymes Cycle 14 bevacizumab 05/23/2021, atezolizumab held secondary to elevated liver enzymes Cycle 15 bevacizumab 06/12/2021, atezolizumab held secondary to elevated liver enzymes Treatment held 07/03/2021 due to progressive rise in liver enzymes, referred for restaging MRI 07/08/2021 MRI abdomen-progression of widespread multifocal intrahepatic hepatocellular carcinoma, developing hepatic vein and IVC invasion and/or tumor thrombus. Lenvatinib 4 mg daily 07/22/2021 Coronary artery disease Atrial fibrillation-maintained on anticoagulation History of basal cell and squamous cell skin cancers Diabetes Anorexia/weight loss Abdominal pain Urinary tract infection 08/30/2020  Disposition: Stacy [name] appears unchanged.  She began lenvatinib 07/22/2021.  She will begin Zofran 4 mg prior to each dose.  She will contact the office if this does not help with nausea.  CBC and chemistry panel pending.  We will follow-up on the results.  We discussed a taper schedule for prednisone (5 mg daily for 7 days, 2.5 mg daily for 7 days, 2.5 mg every other day for 7 days and then stop).  She will return for lab and follow-up in approximately 2 weeks.  We are available to see her sooner if needed.    Ned Card ANP/GNP-BC   07/31/2021  1:21 PM

## 2021-07-31 NOTE — Telephone Encounter (Signed)
Notified daughter of high K+ and renal functions along with liver functions. She will go to [facility] now to pick up the Kayexalate and asking for MMW to be sent there as well. Will return tomorrow at 11am for repeat CMP. Script for MMW called to [facility] and confirmed they have the Kayexalate ready for pick up.

## 2021-07-31 NOTE — Progress Notes (Signed)
CRITICAL VALUE STICKER  CRITICAL VALUE: AST 212 and ALT 838  RECEIVER (on-site recipient of call): Annisa Mazzarella,RN  DATE & TIME NOTIFIED: 07/31/21 @ 1400  MESSENGER (representative from lab):Simona Huh  MD NOTIFIED: Ned Card, NP  TIME OF NOTIFICATION:1405  RESPONSE: currently seeing patient in office.

## 2021-08-01 ENCOUNTER — Other Ambulatory Visit: Payer: Self-pay

## 2021-08-01 ENCOUNTER — Inpatient Hospital Stay: Payer: Medicare Other

## 2021-08-01 ENCOUNTER — Other Ambulatory Visit: Payer: Self-pay | Admitting: Nurse Practitioner

## 2021-08-01 ENCOUNTER — Other Ambulatory Visit (HOSPITAL_COMMUNITY): Payer: Self-pay

## 2021-08-01 ENCOUNTER — Telehealth: Payer: Self-pay

## 2021-08-01 DIAGNOSIS — I4891 Unspecified atrial fibrillation: Secondary | ICD-10-CM | POA: Diagnosis not present

## 2021-08-01 DIAGNOSIS — E119 Type 2 diabetes mellitus without complications: Secondary | ICD-10-CM | POA: Diagnosis not present

## 2021-08-01 DIAGNOSIS — R748 Abnormal levels of other serum enzymes: Secondary | ICD-10-CM

## 2021-08-01 DIAGNOSIS — C220 Liver cell carcinoma: Secondary | ICD-10-CM

## 2021-08-01 DIAGNOSIS — I2510 Atherosclerotic heart disease of native coronary artery without angina pectoris: Secondary | ICD-10-CM | POA: Diagnosis not present

## 2021-08-01 DIAGNOSIS — Z5112 Encounter for antineoplastic immunotherapy: Secondary | ICD-10-CM | POA: Diagnosis not present

## 2021-08-01 DIAGNOSIS — R634 Abnormal weight loss: Secondary | ICD-10-CM | POA: Diagnosis not present

## 2021-08-01 LAB — CMP (CANCER CENTER ONLY)
ALT: 808 U/L (ref 0–44)
AST: 194 U/L (ref 15–41)
Albumin: 3.1 g/dL — ABNORMAL LOW (ref 3.5–5.0)
Alkaline Phosphatase: 159 U/L — ABNORMAL HIGH (ref 38–126)
Anion gap: 13 (ref 5–15)
BUN: 26 mg/dL — ABNORMAL HIGH (ref 8–23)
CO2: 26 mmol/L (ref 22–32)
Calcium: 8.8 mg/dL — ABNORMAL LOW (ref 8.9–10.3)
Chloride: 101 mmol/L (ref 98–111)
Creatinine: 2.26 mg/dL — ABNORMAL HIGH (ref 0.44–1.00)
GFR, Estimated: 20 mL/min — ABNORMAL LOW (ref >60)
Glucose, Bld: 162 mg/dL — ABNORMAL HIGH (ref 70–99)
Potassium: 4.2 mmol/L (ref 3.5–5.1)
Sodium: 140 mmol/L (ref 135–145)
Total Bilirubin: 1.6 mg/dL — ABNORMAL HIGH (ref 0.3–1.2)
Total Protein: 5.8 g/dL — ABNORMAL LOW (ref 6.5–8.1)

## 2021-08-01 NOTE — Telephone Encounter (Signed)
-----   Message from Owens Shark, NP sent at 08/01/2021  1:51 PM EDT ----- Please let her daughter know the potassium level is better, kidney function mildly improved but still abnormal.  Dr. Benay Spice recommends a renal ultrasound.  I am placing the order now.

## 2021-08-01 NOTE — Telephone Encounter (Signed)
Called and left a message on my chart to the patient.

## 2021-08-01 NOTE — Telephone Encounter (Signed)
Called daughter and made her aware to K+ and creatinine results--better. MD wants to have her do a renal US to r/u hydronephrosis and she agrees to this plan. Scheduled at [facility] tomorrow at 1245/1300. Need to arrive w/full bladder.

## 2021-08-02 ENCOUNTER — Ambulatory Visit (HOSPITAL_BASED_OUTPATIENT_CLINIC_OR_DEPARTMENT_OTHER)
Admission: RE | Admit: 2021-08-02 | Discharge: 2021-08-02 | Disposition: A | Discharge status: Home / Self Care | Payer: Medicare Other | Source: Ambulatory Visit | Attending: Nurse Practitioner | Admitting: Nurse Practitioner

## 2021-08-02 ENCOUNTER — Telehealth: Payer: Self-pay

## 2021-08-02 DIAGNOSIS — R748 Abnormal levels of other serum enzymes: Secondary | ICD-10-CM | POA: Insufficient documentation

## 2021-08-02 DIAGNOSIS — C220 Liver cell carcinoma: Secondary | ICD-10-CM | POA: Diagnosis not present

## 2021-08-02 DIAGNOSIS — N281 Cyst of kidney, acquired: Secondary | ICD-10-CM | POA: Diagnosis not present

## 2021-08-02 NOTE — Progress Notes (Signed)
Reconnected with daughter in regards to if they are still interested in CCM services and daughter stated her mom is being treated for liver cancer and she declined our services but appreciated my call.   Elray Mcgregor, [place] Pharmacist Assistant  [phone]

## 2021-08-03 ENCOUNTER — Telehealth: Payer: Self-pay | Admitting: *Deleted

## 2021-08-03 NOTE — Telephone Encounter (Signed)
Left VM for daughter, [name] that renal US was negative for hydronephrosis. F/U as scheduled on 08/16/21.

## 2021-08-07 ENCOUNTER — Other Ambulatory Visit (HOSPITAL_COMMUNITY): Payer: Self-pay

## 2021-08-08 ENCOUNTER — Other Ambulatory Visit: Payer: Self-pay | Admitting: Oncology

## 2021-08-08 ENCOUNTER — Other Ambulatory Visit (HOSPITAL_COMMUNITY): Payer: Self-pay

## 2021-08-09 ENCOUNTER — Other Ambulatory Visit (HOSPITAL_COMMUNITY): Payer: Self-pay

## 2021-08-09 MED ORDER — LENVIMA (4 MG DAILY DOSE) 4 MG PO CPPK
4.0000 mg | ORAL_CAPSULE | Freq: Every day | ORAL | 0 refills | Status: AC
Start: 2021-08-09 — End: ?
  Filled 2021-08-09: qty 30, 30d supply, fill #0

## 2021-08-13 ENCOUNTER — Other Ambulatory Visit (HOSPITAL_COMMUNITY): Payer: Self-pay

## 2021-08-15 ENCOUNTER — Other Ambulatory Visit: Payer: Self-pay | Admitting: Cardiovascular Disease

## 2021-08-15 ENCOUNTER — Other Ambulatory Visit (HOSPITAL_COMMUNITY): Payer: Self-pay

## 2021-08-15 DIAGNOSIS — E785 Hyperlipidemia, unspecified: Secondary | ICD-10-CM

## 2021-08-15 DIAGNOSIS — I2510 Atherosclerotic heart disease of native coronary artery without angina pectoris: Secondary | ICD-10-CM

## 2021-08-16 ENCOUNTER — Telehealth: Payer: Self-pay

## 2021-08-16 ENCOUNTER — Inpatient Hospital Stay: Payer: Medicare Other | Attending: Nurse Practitioner

## 2021-08-16 ENCOUNTER — Other Ambulatory Visit: Payer: Self-pay | Admitting: Cardiovascular Disease

## 2021-08-16 ENCOUNTER — Inpatient Hospital Stay: Payer: Medicare Other

## 2021-08-16 ENCOUNTER — Inpatient Hospital Stay (HOSPITAL_BASED_OUTPATIENT_CLINIC_OR_DEPARTMENT_OTHER): Payer: Medicare Other | Admitting: Nurse Practitioner

## 2021-08-16 ENCOUNTER — Encounter: Payer: Self-pay | Admitting: Nurse Practitioner

## 2021-08-16 VITALS — BP 110/60 | HR 90 | Temp 98.2°F | Resp 18 | Ht 62.0 in | Wt 103.0 lb

## 2021-08-16 DIAGNOSIS — R197 Diarrhea, unspecified: Secondary | ICD-10-CM | POA: Diagnosis not present

## 2021-08-16 DIAGNOSIS — Z85828 Personal history of other malignant neoplasm of skin: Secondary | ICD-10-CM | POA: Insufficient documentation

## 2021-08-16 DIAGNOSIS — C220 Liver cell carcinoma: Secondary | ICD-10-CM

## 2021-08-16 DIAGNOSIS — E860 Dehydration: Secondary | ICD-10-CM | POA: Insufficient documentation

## 2021-08-16 DIAGNOSIS — E119 Type 2 diabetes mellitus without complications: Secondary | ICD-10-CM | POA: Diagnosis not present

## 2021-08-16 DIAGNOSIS — R634 Abnormal weight loss: Secondary | ICD-10-CM | POA: Diagnosis not present

## 2021-08-16 DIAGNOSIS — I2510 Atherosclerotic heart disease of native coronary artery without angina pectoris: Secondary | ICD-10-CM | POA: Insufficient documentation

## 2021-08-16 DIAGNOSIS — R5383 Other fatigue: Secondary | ICD-10-CM

## 2021-08-16 DIAGNOSIS — R630 Anorexia: Secondary | ICD-10-CM | POA: Insufficient documentation

## 2021-08-16 DIAGNOSIS — Z7901 Long term (current) use of anticoagulants: Secondary | ICD-10-CM | POA: Diagnosis not present

## 2021-08-16 DIAGNOSIS — K1379 Other lesions of oral mucosa: Secondary | ICD-10-CM | POA: Diagnosis not present

## 2021-08-16 DIAGNOSIS — I4891 Unspecified atrial fibrillation: Secondary | ICD-10-CM | POA: Diagnosis not present

## 2021-08-16 DIAGNOSIS — R531 Weakness: Secondary | ICD-10-CM | POA: Insufficient documentation

## 2021-08-16 LAB — CBC WITH DIFFERENTIAL (CANCER CENTER ONLY)
Abs Immature Granulocytes: 0.05 10*3/uL (ref 0.00–0.07)
Basophils Absolute: 0 10*3/uL (ref 0.0–0.1)
Basophils Relative: 0 %
Eosinophils Absolute: 0.3 10*3/uL (ref 0.0–0.5)
Eosinophils Relative: 2 %
HCT: 50.9 % — ABNORMAL HIGH (ref 36.0–46.0)
Hemoglobin: 15 g/dL (ref 12.0–15.0)
Immature Granulocytes: 0 %
Lymphocytes Relative: 31 %
Lymphs Abs: 3.7 10*3/uL (ref 0.7–4.0)
MCH: 24 pg — ABNORMAL LOW (ref 26.0–34.0)
MCHC: 29.5 g/dL — ABNORMAL LOW (ref 30.0–36.0)
MCV: 81.4 fL (ref 80.0–100.0)
Monocytes Absolute: 0.7 10*3/uL (ref 0.1–1.0)
Monocytes Relative: 6 %
Neutro Abs: 7.1 10*3/uL (ref 1.7–7.7)
Neutrophils Relative %: 61 %
Platelet Count: 257 10*3/uL (ref 150–400)
RBC: 6.25 MIL/uL — ABNORMAL HIGH (ref 3.87–5.11)
RDW: 21.8 % — ABNORMAL HIGH (ref 11.5–15.5)
WBC Count: 11.9 10*3/uL — ABNORMAL HIGH (ref 4.0–10.5)
nRBC: 0.2 % (ref 0.0–0.2)

## 2021-08-16 LAB — CMP (CANCER CENTER ONLY)
ALT: 684 U/L (ref 0–44)
AST: 252 U/L (ref 15–41)
Albumin: 2.8 g/dL — ABNORMAL LOW (ref 3.5–5.0)
Alkaline Phosphatase: 199 U/L — ABNORMAL HIGH (ref 38–126)
Anion gap: 14 (ref 5–15)
BUN: 34 mg/dL — ABNORMAL HIGH (ref 8–23)
CO2: 18 mmol/L — ABNORMAL LOW (ref 22–32)
Calcium: 8.6 mg/dL — ABNORMAL LOW (ref 8.9–10.3)
Chloride: 103 mmol/L (ref 98–111)
Creatinine: 3.07 mg/dL (ref 0.44–1.00)
GFR, Estimated: 14 mL/min — ABNORMAL LOW (ref >60)
Glucose, Bld: 175 mg/dL — ABNORMAL HIGH (ref 70–99)
Potassium: 4.7 mmol/L (ref 3.5–5.1)
Sodium: 135 mmol/L (ref 135–145)
Total Bilirubin: 2.5 mg/dL — ABNORMAL HIGH (ref 0.3–1.2)
Total Protein: 5.7 g/dL — ABNORMAL LOW (ref 6.5–8.1)

## 2021-08-16 LAB — TSH: TSH: 10.697 u[IU]/mL — ABNORMAL HIGH (ref 0.350–4.500)

## 2021-08-16 MED ORDER — SODIUM CHLORIDE 0.9 % IV SOLN: Freq: Once | INTRAVENOUS | Status: AC

## 2021-08-16 MED ORDER — ONDANSETRON HCL 4 MG/2ML IJ SOLN
4.0000 mg | Freq: Once | INTRAMUSCULAR | Status: AC
  Administered 2021-08-16: 4 mg via INTRAVENOUS

## 2021-08-16 NOTE — Progress Notes (Signed)
[place] OFFICE PROGRESS NOTE   Diagnosis: Hepatocellular carcinoma  INTERVAL HISTORY:   [name] returns as scheduled.  She continues lenvatinib 4 mg daily.  She has had a decline in appetite.  She began having diarrhea about a week ago.  Mouth and throat are sore.  No rash.  She had a fall earlier in the week, having pain at the very low back.  No nausea.  Objective:  Vital signs in last 24 hours:  Blood pressure 110/60, pulse 90, temperature 98.2 F (36.8 C), temperature source Oral, resp. rate 18, height '5\' 2"'$  (1.575 m), weight 103 lb (46.7 kg), SpO2 98 %.    HEENT: Area of ulceration right buccal mucosa. Resp: Lungs clear bilaterally. Cardio: Irregular. GI: Abdomen soft and nontender.  No hepatomegaly. Vascular: No leg edema. Skin: No rash. Musculoskeletal: Tender over the coccyx region.  Lab Results:  Lab Results  Component Value Date   WBC 15.0 (H) 07/31/2021   HGB 14.6 07/31/2021   HCT 50.6 (H) 07/31/2021   MCV 86.2 07/31/2021   PLT 346 07/31/2021   NEUTROABS 7.1 07/31/2021    Imaging:  No results found.  Medications: I have reviewed the patient's current medications.  Assessment/Plan: Multiple liver masses CT abdomen/pelvis 07/11/2018, low-attenuation liver lesions incompletely characterized without contrast Ultrasound abdomen 07/10/2020-innumerable incompletely assessed hypoechoic masses in the liver MRI abdomen 07/29/2020- innumerable hypervascular liver masses, nodular liver contour, 1.5 cm left adrenal mass Biopsy liver lesion 08/08/2020-hepatocellular carcinoma, moderately differentiated Cycle 1 atezolizumab/bevacizumab 08/18/2020 Cycle 2 atezolizumab/bevacizumab 09/07/2020 Cycle 3 atezolizumab/bevacizumab 09/28/2020 Cycle 4 atezolizumab/bevacizumab 10/19/2020 MRI liver 11/07/2020-numerous hyperenhancing liver masses-stable to mildly improved compared to the MRI 07/29/2020 Cycle 5 atezolizumab/bevacizumab 11/10/2020 Cycle 6  atezolizumab/bevacizumab 12/04/2020 Cycle 7 atezolizumab/bevacizumab 12/26/2020 Cycle 8 atezolizumab/bevacizumab 01/16/2021 Cycle 9 atezolizumab/bevacizumab 02/06/2021 Cycle 10 atezolizumab/bevacizumab 02/26/2021 03/20/2021 MRI abdomen-right greater than left multifocal hepatocellular carcinoma, similar to exam 11/07/2020; new small right pleural effusion. Cycle 11 atezolizumab/bevacizumab 03/22/2021 Cycle 12 atezolizumab/bevacizumab 04/12/2021 HELD due to increased LFTs Cycle 13 bevacizumab 05/02/2021, atezolizumab held secondary to elevated liver enzymes Cycle 14 bevacizumab 05/23/2021, atezolizumab held secondary to elevated liver enzymes Cycle 15 bevacizumab 06/12/2021, atezolizumab held secondary to elevated liver enzymes Treatment held 07/03/2021 due to progressive rise in liver enzymes, referred for restaging MRI 07/08/2021 MRI abdomen-progression of widespread multifocal intrahepatic hepatocellular carcinoma, developing hepatic vein and IVC invasion and/or tumor thrombus. Lenvatinib 4 mg daily 07/22/2021 08/16/2021 lenvatinib placed on hold Coronary artery disease Atrial fibrillation-maintained on anticoagulation History of basal cell and squamous cell skin cancers Diabetes Anorexia/weight loss Abdominal pain Urinary tract infection 08/30/2020  Disposition: [name] has multifocal hepatocellular carcinoma.  She began a trial of lenvatinib 3 to 4 weeks ago.  Her performance status has declined since the last office visit.  She is experiencing anorexia/weight loss, diarrhea, mucositis.  The diarrhea and mucositis are likely related to lenvatinib.  She appears dehydrated.  We are placing lenvatinib on hold.  She will receive IV fluids today and tomorrow.  We will see her in follow-up next week to reevaluate.  Patient seen with Dr. Benay Spice.  Ned Card ANP/GNP-BC   08/16/2021  2:07 PM  This was a shared visit with Ned Card.  Ms. Hibbitts was interviewed and examined.  Her performance status has  declined since beginning lenvatinib.  She appears dehydrated and has mucositis.  I suspect her clinical presentation is related to toxicity from the lenvatinib +/- disease progression.  She will receive intravenous fluids today and again tomorrow.  Lenvatinib will  be placed on hold.  I was present for greater than 50% today's visit.  I performed medical decision making.  Julieanne Manson, MD

## 2021-08-16 NOTE — Telephone Encounter (Signed)
Called Mrs. Stacy [name] and left a message to made sure her mother drink plenty of fluids.

## 2021-08-17 ENCOUNTER — Encounter: Payer: Self-pay | Admitting: Oncology

## 2021-08-17 ENCOUNTER — Other Ambulatory Visit: Payer: Self-pay | Admitting: Nurse Practitioner

## 2021-08-17 ENCOUNTER — Inpatient Hospital Stay: Payer: Medicare Other

## 2021-08-17 ENCOUNTER — Other Ambulatory Visit: Payer: Self-pay

## 2021-08-17 ENCOUNTER — Telehealth: Payer: Self-pay

## 2021-08-17 DIAGNOSIS — Z8673 Personal history of transient ischemic attack (TIA), and cerebral infarction without residual deficits: Secondary | ICD-10-CM | POA: Diagnosis not present

## 2021-08-17 DIAGNOSIS — Z515 Encounter for palliative care: Secondary | ICD-10-CM | POA: Diagnosis not present

## 2021-08-17 DIAGNOSIS — C220 Liver cell carcinoma: Secondary | ICD-10-CM

## 2021-08-17 DIAGNOSIS — I130 Hypertensive heart and chronic kidney disease with heart failure and stage 1 through stage 4 chronic kidney disease, or unspecified chronic kidney disease: Secondary | ICD-10-CM | POA: Diagnosis not present

## 2021-08-17 DIAGNOSIS — R531 Weakness: Secondary | ICD-10-CM | POA: Diagnosis not present

## 2021-08-17 DIAGNOSIS — I10 Essential (primary) hypertension: Secondary | ICD-10-CM | POA: Diagnosis not present

## 2021-08-17 DIAGNOSIS — N179 Acute kidney failure, unspecified: Secondary | ICD-10-CM | POA: Diagnosis not present

## 2021-08-17 DIAGNOSIS — E43 Unspecified severe protein-calorie malnutrition: Secondary | ICD-10-CM | POA: Diagnosis not present

## 2021-08-17 DIAGNOSIS — R634 Abnormal weight loss: Secondary | ICD-10-CM | POA: Diagnosis not present

## 2021-08-17 DIAGNOSIS — I4891 Unspecified atrial fibrillation: Secondary | ICD-10-CM | POA: Diagnosis not present

## 2021-08-17 DIAGNOSIS — I2510 Atherosclerotic heart disease of native coronary artery without angina pectoris: Secondary | ICD-10-CM | POA: Diagnosis not present

## 2021-08-17 DIAGNOSIS — Z85828 Personal history of other malignant neoplasm of skin: Secondary | ICD-10-CM | POA: Diagnosis not present

## 2021-08-17 DIAGNOSIS — E1122 Type 2 diabetes mellitus with diabetic chronic kidney disease: Secondary | ICD-10-CM | POA: Diagnosis present

## 2021-08-17 DIAGNOSIS — Z7901 Long term (current) use of anticoagulants: Secondary | ICD-10-CM | POA: Diagnosis not present

## 2021-08-17 DIAGNOSIS — Y92009 Unspecified place in unspecified non-institutional (private) residence as the place of occurrence of the external cause: Secondary | ICD-10-CM | POA: Diagnosis not present

## 2021-08-17 DIAGNOSIS — E860 Dehydration: Secondary | ICD-10-CM | POA: Diagnosis not present

## 2021-08-17 DIAGNOSIS — N184 Chronic kidney disease, stage 4 (severe): Secondary | ICD-10-CM | POA: Diagnosis not present

## 2021-08-17 DIAGNOSIS — R197 Diarrhea, unspecified: Secondary | ICD-10-CM | POA: Diagnosis not present

## 2021-08-17 DIAGNOSIS — K1230 Oral mucositis (ulcerative), unspecified: Secondary | ICD-10-CM | POA: Diagnosis not present

## 2021-08-17 DIAGNOSIS — E872 Acidosis, unspecified: Secondary | ICD-10-CM | POA: Diagnosis not present

## 2021-08-17 DIAGNOSIS — I480 Paroxysmal atrial fibrillation: Secondary | ICD-10-CM | POA: Diagnosis not present

## 2021-08-17 DIAGNOSIS — E875 Hyperkalemia: Secondary | ICD-10-CM | POA: Diagnosis present

## 2021-08-17 DIAGNOSIS — I959 Hypotension, unspecified: Secondary | ICD-10-CM | POA: Diagnosis not present

## 2021-08-17 DIAGNOSIS — K1379 Other lesions of oral mucosa: Secondary | ICD-10-CM | POA: Diagnosis not present

## 2021-08-17 DIAGNOSIS — Z955 Presence of coronary angioplasty implant and graft: Secondary | ICD-10-CM | POA: Diagnosis not present

## 2021-08-17 DIAGNOSIS — I5022 Chronic systolic (congestive) heart failure: Secondary | ICD-10-CM | POA: Diagnosis not present

## 2021-08-17 DIAGNOSIS — C801 Malignant (primary) neoplasm, unspecified: Secondary | ICD-10-CM

## 2021-08-17 DIAGNOSIS — M545 Low back pain, unspecified: Secondary | ICD-10-CM | POA: Diagnosis not present

## 2021-08-17 DIAGNOSIS — R630 Anorexia: Secondary | ICD-10-CM | POA: Diagnosis not present

## 2021-08-17 DIAGNOSIS — E785 Hyperlipidemia, unspecified: Secondary | ICD-10-CM | POA: Diagnosis present

## 2021-08-17 DIAGNOSIS — E119 Type 2 diabetes mellitus without complications: Secondary | ICD-10-CM | POA: Diagnosis not present

## 2021-08-17 DIAGNOSIS — K7682 Hepatic encephalopathy: Secondary | ICD-10-CM | POA: Diagnosis not present

## 2021-08-17 DIAGNOSIS — F419 Anxiety disorder, unspecified: Secondary | ICD-10-CM | POA: Diagnosis present

## 2021-08-17 DIAGNOSIS — Z66 Do not resuscitate: Secondary | ICD-10-CM | POA: Diagnosis not present

## 2021-08-17 DIAGNOSIS — Z6826 Body mass index (BMI) 26.0-26.9, adult: Secondary | ICD-10-CM | POA: Diagnosis not present

## 2021-08-17 DIAGNOSIS — Z794 Long term (current) use of insulin: Secondary | ICD-10-CM | POA: Diagnosis not present

## 2021-08-17 DIAGNOSIS — I4819 Other persistent atrial fibrillation: Secondary | ICD-10-CM | POA: Diagnosis present

## 2021-08-17 LAB — BASIC METABOLIC PANEL - CANCER CENTER ONLY
Anion gap: 16 — ABNORMAL HIGH (ref 5–15)
BUN: 38 mg/dL — ABNORMAL HIGH (ref 8–23)
CO2: 17 mmol/L — ABNORMAL LOW (ref 22–32)
Calcium: 8.5 mg/dL — ABNORMAL LOW (ref 8.9–10.3)
Chloride: 108 mmol/L (ref 98–111)
Creatinine: 2.97 mg/dL — ABNORMAL HIGH (ref 0.44–1.00)
GFR, Estimated: 14 mL/min — ABNORMAL LOW (ref >60)
Glucose, Bld: 152 mg/dL — ABNORMAL HIGH (ref 70–99)
Potassium: 5.2 mmol/L — ABNORMAL HIGH (ref 3.5–5.1)
Sodium: 141 mmol/L (ref 135–145)

## 2021-08-17 MED ORDER — SODIUM CHLORIDE 0.9 % IV SOLN: INTRAVENOUS | Status: DC

## 2021-08-17 NOTE — Patient Instructions (Signed)
Rehydration, Adult Rehydration is the replacement of body fluids, salts, and minerals (electrolytes) that are lost during dehydration. Dehydration is when there is not enough water or other fluids in the body. This happens when you lose more fluids than you take in. Common causes of dehydration include: Not drinking enough fluids. This can occur when you are ill or doing activities that require a lot of energy, especially in hot weather. Conditions that cause loss of water or other fluids, such as diarrhea, vomiting, sweating, or urinating a lot. Other illnesses, such as fever or infection. Certain medicines, such as those that remove excess fluid from the body (diuretics). Symptoms of mild or moderate dehydration may include thirst, dry lips and mouth, and dizziness. Symptoms of severe dehydration may include increased heart rate, confusion, fainting, and not urinating. For severe dehydration, you may need to get fluids through an IV at the hospital. For mild or moderate dehydration, you can usually rehydrate at home by drinking certain fluids as told by your health care provider. What are the risks? Generally, rehydration is safe. However, taking in too much fluid (overhydration) can be a problem. This is rare. Overhydration can cause an electrolyte imbalance, kidney failure, or a decrease in salt (sodium) levels in the body. Supplies needed You will need an oral rehydration solution (ORS) if your health care provider tells you to use one. This is a drink to treat dehydration. It can be found in pharmacies and retail stores. How to rehydrate Fluids Follow instructions from your health care provider for rehydration. The kind of fluid and the amount you should drink depend on your condition. In general, you should choose drinks that you prefer. If told by your health care provider, drink an ORS. Make an ORS by following instructions on the package. Start by drinking small amounts, about  cup (120  mL) every 5-10 minutes. Slowly increase how much you drink until you have taken the amount recommended by your health care provider. Drink enough clear fluids to keep your urine pale yellow. If you were told to drink an ORS, finish it first, then start slowly drinking other clear fluids. Drink fluids such as: Water. This includes sparkling water and flavored water. Drinking only water can lead to having too little sodium in your body (hyponatremia). Follow the advice of your health care provider. Water from ice chips you suck on. Fruit juice with water you add to it (diluted). Sports drinks. Hot or cold herbal teas. Broth-based soups. Milk or milk products. Food Follow instructions from your health care provider about what to eat while you rehydrate. Your health care provider may recommend that you slowly begin eating regular foods in small amounts. Eat foods that contain a healthy balance of electrolytes, such as bananas, oranges, potatoes, tomatoes, and spinach. Avoid foods that are greasy or contain a lot of sugar. In some cases, you may get nutrition through a feeding tube that is passed through your nose and into your stomach (nasogastric tube, or NG tube). This may be done if you have uncontrolled vomiting or diarrhea. Beverages to avoid  Certain beverages may make dehydration worse. While you rehydrate, avoid drinking alcohol. How to tell if you are recovering from dehydration You may be recovering from dehydration if: You are urinating more often than before you started rehydrating. Your urine is pale yellow. Your energy level improves. You vomit less frequently. You have diarrhea less frequently. Your appetite improves or returns to normal. You feel less dizzy or less light-headed.   Your skin tone and color start to look more normal. Follow these instructions at home: Take over-the-counter and prescription medicines only as told by your health care provider. Do not take sodium  tablets. Doing this can lead to having too much sodium in your body (hypernatremia). Contact a health care provider if: You continue to have symptoms of mild or moderate dehydration, such as: Thirst. Dry lips. Slightly dry mouth. Dizziness. Dark urine or less urine than normal. Muscle cramps. You continue to vomit or have diarrhea. Get help right away if you: Have symptoms of dehydration that get worse. Have a fever. Have a severe headache. Have been vomiting and the following happens: Your vomiting gets worse or does not go away. Your vomit includes blood or green matter (bile). You cannot eat or drink without vomiting. Have problems with urination or bowel movements, such as: Diarrhea that gets worse or does not go away. Blood in your stool (feces). This may cause stool to look black and tarry. Not urinating, or urinating only a small amount of very dark urine, within 6-8 hours. Have trouble breathing. Have symptoms that get worse with treatment. These symptoms may represent a serious problem that is an emergency. Do not wait to see if the symptoms will go away. Get medical help right away. Call your local emergency services (911 in the [place] Do not drive yourself to the hospital. Summary Rehydration is the replacement of body fluids and minerals (electrolytes) that are lost during dehydration. Follow instructions from your health care provider for rehydration. The kind of fluid and amount you should drink depend on your condition. Slowly increase how much you drink until you have taken the amount recommended by your health care provider. Contact your health care provider if you continue to show signs of mild or moderate dehydration. This information is not intended to replace advice given to you by your health care provider. Make sure you discuss any questions you have with your health care provider. Document Revised: 03/31/2019 Document Reviewed: 02/08/2019 Elsevier Patient  Education  2023 [employer] Inc[employer]  

## 2021-08-17 NOTE — Telephone Encounter (Signed)
Called and left a message stated Dr. Benay Spice would like for Mr. Showman to come in early for IVF.

## 2021-08-18 ENCOUNTER — Other Ambulatory Visit: Payer: Self-pay | Admitting: *Deleted

## 2021-08-18 ENCOUNTER — Inpatient Hospital Stay: Payer: Medicare Other

## 2021-08-18 DIAGNOSIS — C801 Malignant (primary) neoplasm, unspecified: Secondary | ICD-10-CM

## 2021-08-18 DIAGNOSIS — C220 Liver cell carcinoma: Secondary | ICD-10-CM | POA: Diagnosis not present

## 2021-08-18 MED ORDER — SODIUM CHLORIDE 0.9 % IV SOLN: INTRAVENOUS | Status: DC

## 2021-08-20 ENCOUNTER — Other Ambulatory Visit: Payer: Self-pay

## 2021-08-20 ENCOUNTER — Encounter: Payer: Self-pay | Admitting: Nurse Practitioner

## 2021-08-20 ENCOUNTER — Inpatient Hospital Stay: Payer: Medicare Other

## 2021-08-20 ENCOUNTER — Inpatient Hospital Stay (HOSPITAL_BASED_OUTPATIENT_CLINIC_OR_DEPARTMENT_OTHER): Payer: Medicare Other | Admitting: Nurse Practitioner

## 2021-08-20 ENCOUNTER — Telehealth: Payer: Self-pay

## 2021-08-20 ENCOUNTER — Encounter (HOSPITAL_COMMUNITY): Payer: Self-pay | Admitting: Family Medicine

## 2021-08-20 ENCOUNTER — Inpatient Hospital Stay (HOSPITAL_COMMUNITY)
Admission: AD | Admit: 2021-08-20 | Discharge: 2021-09-11 | DRG: 640 | Disposition: E | Payer: Medicare Other | Source: Ambulatory Visit | Attending: Internal Medicine | Admitting: Internal Medicine

## 2021-08-20 VITALS — BP 109/90 | HR 73 | Temp 98.1°F | Resp 18

## 2021-08-20 DIAGNOSIS — Z7901 Long term (current) use of anticoagulants: Secondary | ICD-10-CM

## 2021-08-20 DIAGNOSIS — E119 Type 2 diabetes mellitus without complications: Secondary | ICD-10-CM

## 2021-08-20 DIAGNOSIS — E872 Acidosis, unspecified: Secondary | ICD-10-CM | POA: Diagnosis present

## 2021-08-20 DIAGNOSIS — K7682 Hepatic encephalopathy: Secondary | ICD-10-CM | POA: Diagnosis present

## 2021-08-20 DIAGNOSIS — I5022 Chronic systolic (congestive) heart failure: Secondary | ICD-10-CM | POA: Diagnosis present

## 2021-08-20 DIAGNOSIS — C801 Malignant (primary) neoplasm, unspecified: Secondary | ICD-10-CM

## 2021-08-20 DIAGNOSIS — I130 Hypertensive heart and chronic kidney disease with heart failure and stage 1 through stage 4 chronic kidney disease, or unspecified chronic kidney disease: Secondary | ICD-10-CM | POA: Diagnosis present

## 2021-08-20 DIAGNOSIS — I10 Essential (primary) hypertension: Secondary | ICD-10-CM | POA: Diagnosis not present

## 2021-08-20 DIAGNOSIS — Z888 Allergy status to other drugs, medicaments and biological substances status: Secondary | ICD-10-CM

## 2021-08-20 DIAGNOSIS — Z820 Family history of epilepsy and other diseases of the nervous system: Secondary | ICD-10-CM

## 2021-08-20 DIAGNOSIS — Z9221 Personal history of antineoplastic chemotherapy: Secondary | ICD-10-CM

## 2021-08-20 DIAGNOSIS — Z955 Presence of coronary angioplasty implant and graft: Secondary | ICD-10-CM | POA: Diagnosis not present

## 2021-08-20 DIAGNOSIS — R197 Diarrhea, unspecified: Secondary | ICD-10-CM

## 2021-08-20 DIAGNOSIS — K1230 Oral mucositis (ulcerative), unspecified: Secondary | ICD-10-CM

## 2021-08-20 DIAGNOSIS — F419 Anxiety disorder, unspecified: Secondary | ICD-10-CM | POA: Diagnosis present

## 2021-08-20 DIAGNOSIS — E1122 Type 2 diabetes mellitus with diabetic chronic kidney disease: Secondary | ICD-10-CM | POA: Diagnosis present

## 2021-08-20 DIAGNOSIS — Z6826 Body mass index (BMI) 26.0-26.9, adult: Secondary | ICD-10-CM

## 2021-08-20 DIAGNOSIS — Z85828 Personal history of other malignant neoplasm of skin: Secondary | ICD-10-CM

## 2021-08-20 DIAGNOSIS — E875 Hyperkalemia: Secondary | ICD-10-CM | POA: Diagnosis present

## 2021-08-20 DIAGNOSIS — Z8673 Personal history of transient ischemic attack (TIA), and cerebral infarction without residual deficits: Secondary | ICD-10-CM | POA: Diagnosis not present

## 2021-08-20 DIAGNOSIS — Z79899 Other long term (current) drug therapy: Secondary | ICD-10-CM

## 2021-08-20 DIAGNOSIS — Z66 Do not resuscitate: Secondary | ICD-10-CM | POA: Diagnosis not present

## 2021-08-20 DIAGNOSIS — Z794 Long term (current) use of insulin: Secondary | ICD-10-CM

## 2021-08-20 DIAGNOSIS — Z91040 Latex allergy status: Secondary | ICD-10-CM

## 2021-08-20 DIAGNOSIS — E785 Hyperlipidemia, unspecified: Secondary | ICD-10-CM | POA: Diagnosis present

## 2021-08-20 DIAGNOSIS — I959 Hypotension, unspecified: Secondary | ICD-10-CM | POA: Diagnosis present

## 2021-08-20 DIAGNOSIS — N179 Acute kidney failure, unspecified: Secondary | ICD-10-CM | POA: Diagnosis present

## 2021-08-20 DIAGNOSIS — I4819 Other persistent atrial fibrillation: Secondary | ICD-10-CM | POA: Diagnosis present

## 2021-08-20 DIAGNOSIS — Z515 Encounter for palliative care: Secondary | ICD-10-CM

## 2021-08-20 DIAGNOSIS — C220 Liver cell carcinoma: Secondary | ICD-10-CM

## 2021-08-20 DIAGNOSIS — Z823 Family history of stroke: Secondary | ICD-10-CM

## 2021-08-20 DIAGNOSIS — Z881 Allergy status to other antibiotic agents status: Secondary | ICD-10-CM

## 2021-08-20 DIAGNOSIS — R7989 Other specified abnormal findings of blood chemistry: Secondary | ICD-10-CM | POA: Diagnosis present

## 2021-08-20 DIAGNOSIS — N184 Chronic kidney disease, stage 4 (severe): Secondary | ICD-10-CM

## 2021-08-20 DIAGNOSIS — I2510 Atherosclerotic heart disease of native coronary artery without angina pectoris: Secondary | ICD-10-CM

## 2021-08-20 DIAGNOSIS — E43 Unspecified severe protein-calorie malnutrition: Secondary | ICD-10-CM | POA: Diagnosis present

## 2021-08-20 DIAGNOSIS — Z8249 Family history of ischemic heart disease and other diseases of the circulatory system: Secondary | ICD-10-CM

## 2021-08-20 DIAGNOSIS — Z833 Family history of diabetes mellitus: Secondary | ICD-10-CM

## 2021-08-20 DIAGNOSIS — Z87891 Personal history of nicotine dependence: Secondary | ICD-10-CM

## 2021-08-20 DIAGNOSIS — Z8042 Family history of malignant neoplasm of prostate: Secondary | ICD-10-CM

## 2021-08-20 DIAGNOSIS — I480 Paroxysmal atrial fibrillation: Secondary | ICD-10-CM | POA: Diagnosis not present

## 2021-08-20 DIAGNOSIS — R54 Age-related physical debility: Secondary | ICD-10-CM | POA: Diagnosis present

## 2021-08-20 DIAGNOSIS — R000 Tachycardia, unspecified: Secondary | ICD-10-CM | POA: Diagnosis not present

## 2021-08-20 DIAGNOSIS — Y92009 Unspecified place in unspecified non-institutional (private) residence as the place of occurrence of the external cause: Secondary | ICD-10-CM | POA: Diagnosis not present

## 2021-08-20 DIAGNOSIS — M545 Low back pain, unspecified: Secondary | ICD-10-CM | POA: Diagnosis not present

## 2021-08-20 DIAGNOSIS — E860 Dehydration: Secondary | ICD-10-CM | POA: Diagnosis present

## 2021-08-20 DIAGNOSIS — Z91041 Radiographic dye allergy status: Secondary | ICD-10-CM

## 2021-08-20 DIAGNOSIS — T50995A Adverse effect of other drugs, medicaments and biological substances, initial encounter: Secondary | ICD-10-CM | POA: Diagnosis present

## 2021-08-20 LAB — COMPREHENSIVE METABOLIC PANEL
ALT: 844 U/L — ABNORMAL HIGH (ref 0–44)
AST: 357 U/L — ABNORMAL HIGH (ref 15–41)
Albumin: 1.7 g/dL — ABNORMAL LOW (ref 3.5–5.0)
Alkaline Phosphatase: 197 U/L — ABNORMAL HIGH (ref 38–126)
Anion gap: 12 (ref 5–15)
BUN: 48 mg/dL — ABNORMAL HIGH (ref 8–23)
CO2: 11 mmol/L — ABNORMAL LOW (ref 22–32)
Calcium: 7.7 mg/dL — ABNORMAL LOW (ref 8.9–10.3)
Chloride: 117 mmol/L — ABNORMAL HIGH (ref 98–111)
Creatinine, Ser: 3.4 mg/dL — ABNORMAL HIGH (ref 0.44–1.00)
GFR, Estimated: 12 mL/min — ABNORMAL LOW (ref >60)
Glucose, Bld: 105 mg/dL — ABNORMAL HIGH (ref 70–99)
Potassium: 6.2 mmol/L — ABNORMAL HIGH (ref 3.5–5.1)
Sodium: 140 mmol/L (ref 135–145)
Total Bilirubin: 2.1 mg/dL — ABNORMAL HIGH (ref 0.3–1.2)
Total Protein: 4.5 g/dL — ABNORMAL LOW (ref 6.5–8.1)

## 2021-08-20 LAB — CBC
HCT: 44.6 % (ref 36.0–46.0)
Hemoglobin: 13.5 g/dL (ref 12.0–15.0)
MCH: 24.6 pg — ABNORMAL LOW (ref 26.0–34.0)
MCHC: 30.3 g/dL (ref 30.0–36.0)
MCV: 81.4 fL (ref 80.0–100.0)
Platelets: 237 10*3/uL (ref 150–400)
RBC: 5.48 MIL/uL — ABNORMAL HIGH (ref 3.87–5.11)
RDW: 21.7 % — ABNORMAL HIGH (ref 11.5–15.5)
WBC: 12.8 10*3/uL — ABNORMAL HIGH (ref 4.0–10.5)
nRBC: 0.2 % (ref 0.0–0.2)

## 2021-08-20 LAB — GLUCOSE, CAPILLARY: Glucose-Capillary: 118 mg/dL — ABNORMAL HIGH (ref 70–99)

## 2021-08-20 LAB — MAGNESIUM: Magnesium: 2.1 mg/dL (ref 1.7–2.4)

## 2021-08-20 MED ORDER — ACETAMINOPHEN 325 MG PO TABS: 650.0000 mg | ORAL_TABLET | Freq: Four times a day (QID) | ORAL | Status: DC | PRN

## 2021-08-20 MED ORDER — MAGIC MOUTHWASH
5.0000 mL | Freq: Four times a day (QID) | ORAL | Status: DC | PRN
  Administered 2021-08-20 – 2021-08-22 (×4): 5 mL via ORAL
  Filled 2021-08-20 (×5): qty 5

## 2021-08-20 MED ORDER — SODIUM CHLORIDE 0.9 % IV SOLN: INTRAVENOUS | Status: DC

## 2021-08-20 MED ORDER — INSULIN ASPART 100 UNIT/ML IJ SOLN
0.0000 [IU] | Freq: Three times a day (TID) | INTRAMUSCULAR | Status: DC
  Administered 2021-08-21 – 2021-08-23 (×3): 1 [IU] via SUBCUTANEOUS

## 2021-08-20 MED ORDER — OMEPRAZOLE MAGNESIUM 20 MG PO TBEC: 20.0000 mg | DELAYED_RELEASE_TABLET | Freq: Every day | ORAL | Status: DC | PRN

## 2021-08-20 MED ORDER — TRAMADOL HCL 50 MG PO TABS: 25.0000 mg | ORAL_TABLET | Freq: Two times a day (BID) | ORAL | Status: DC | PRN

## 2021-08-20 MED ORDER — APIXABAN 2.5 MG PO TABS
2.5000 mg | ORAL_TABLET | Freq: Two times a day (BID) | ORAL | Status: DC
  Administered 2021-08-20 – 2021-08-23 (×6): 2.5 mg via ORAL
  Filled 2021-08-20 (×6): qty 1

## 2021-08-20 MED ORDER — SODIUM CHLORIDE 0.9 % IV BOLUS
500.0000 mL | Freq: Once | INTRAVENOUS | Status: AC
Start: 2021-08-20 — End: 2021-08-20
  Administered 2021-08-20: 500 mL via INTRAVENOUS

## 2021-08-20 MED ORDER — PHENOL 1.4 % MT LIQD
1.0000 | OROMUCOSAL | Status: DC | PRN
  Filled 2021-08-20 (×2): qty 177

## 2021-08-20 MED ORDER — ACETAMINOPHEN 650 MG RE SUPP: 650.0000 mg | Freq: Four times a day (QID) | RECTAL | Status: DC | PRN

## 2021-08-20 MED ORDER — DIPHENHYDRAMINE HCL 25 MG PO CAPS
25.0000 mg | ORAL_CAPSULE | Freq: Four times a day (QID) | ORAL | Status: DC | PRN
  Filled 2021-08-20: qty 1

## 2021-08-20 MED ORDER — PANTOPRAZOLE SODIUM 40 MG PO TBEC
40.0000 mg | DELAYED_RELEASE_TABLET | Freq: Every day | ORAL | Status: DC | PRN
  Administered 2021-08-22 – 2021-08-23 (×2): 40 mg via ORAL
  Filled 2021-08-20 (×2): qty 1

## 2021-08-20 MED ORDER — SERTRALINE HCL 50 MG PO TABS
50.0000 mg | ORAL_TABLET | Freq: Every evening | ORAL | Status: DC
  Administered 2021-08-20 – 2021-08-21 (×2): 50 mg via ORAL
  Filled 2021-08-20 (×2): qty 1

## 2021-08-20 MED ORDER — MORPHINE SULFATE (PF) 2 MG/ML IV SOLN
1.0000 mg | Freq: Once | INTRAVENOUS | Status: AC
  Administered 2021-08-20: 1 mg via INTRAVENOUS
  Filled 2021-08-20: qty 1

## 2021-08-20 MED ORDER — SODIUM CHLORIDE 0.9 % IV SOLN: INTRAVENOUS | Status: AC

## 2021-08-20 NOTE — Telephone Encounter (Signed)
Called Stacy [name]'s daughter for an updated. Patient had fluid on Saturday. Stacy [name] stated Everything went great with the infusion on Saturday. However, her mother mouth and throat got worst. Its hard for her to get anything down. Stacy [name] thinks she need to be admit. I discuss these issues with Dr. Benay Spice. And he advice the patient to come in or go to the ED. Stacy [name] wasn't sure to come in or go to the ED, she stated she will call us back to let us know what she goes  to do.

## 2021-08-20 NOTE — Progress Notes (Signed)
[place] OFFICE PROGRESS NOTE   Diagnosis: Hepatocellular carcinoma  INTERVAL HISTORY:   Ms. Osias returns prior to scheduled follow-up.  Lenvatinib was placed on hold last week due to mucositis and diarrhea.  She received IV fluids 08/16/2021, 08/17/2021 and 08/18/2021.  She feels better after receiving the IV fluids.  Over the past day she has been unable to eat/drink due to mouth and throat pain.  She feels weak.  No fever.  No nausea or vomiting.  Continued diarrhea.  Lungs clear bilaterally.  Objective:  Vital signs in last 24 hours:  Blood pressure 109/90, pulse 73, temperature 98.1 F (36.7 C), temperature source Tympanic, resp. rate 18, SpO2 97 %.    HEENT: Mouth is dry appearing.  Ulceration at the right buccal mucosa and posterior palate.  Lesions scattered along the lower lip. Resp: Lungs clear bilaterally. Cardio: Irregular. GI: Abdomen soft and nontender.  No hepatomegaly. Vascular: No leg edema. Skin: Large ecchymosis right lower arm.   Lab Results:  Lab Results  Component Value Date   WBC 11.9 (H) 08/16/2021   HGB 15.0 08/16/2021   HCT 50.9 (H) 08/16/2021   MCV 81.4 08/16/2021   PLT 257 08/16/2021   NEUTROABS 7.1 08/16/2021    Imaging:  No results found.  Medications: I have reviewed the patient's current medications.  Assessment/Plan: Multiple liver masses CT abdomen/pelvis 07/11/2018, low-attenuation liver lesions incompletely characterized without contrast Ultrasound abdomen 07/10/2020-innumerable incompletely assessed hypoechoic masses in the liver MRI abdomen 07/29/2020- innumerable hypervascular liver masses, nodular liver contour, 1.5 cm left adrenal mass Biopsy liver lesion 08/08/2020-hepatocellular carcinoma, moderately differentiated Cycle 1 atezolizumab/bevacizumab 08/18/2020 Cycle 2 atezolizumab/bevacizumab 09/07/2020 Cycle 3 atezolizumab/bevacizumab 09/28/2020 Cycle 4 atezolizumab/bevacizumab 10/19/2020 MRI liver  11/07/2020-numerous hyperenhancing liver masses-stable to mildly improved compared to the MRI 07/29/2020 Cycle 5 atezolizumab/bevacizumab 11/10/2020 Cycle 6 atezolizumab/bevacizumab 12/04/2020 Cycle 7 atezolizumab/bevacizumab 12/26/2020 Cycle 8 atezolizumab/bevacizumab 01/16/2021 Cycle 9 atezolizumab/bevacizumab 02/06/2021 Cycle 10 atezolizumab/bevacizumab 02/26/2021 03/20/2021 MRI abdomen-right greater than left multifocal hepatocellular carcinoma, similar to exam 11/07/2020; new small right pleural effusion. Cycle 11 atezolizumab/bevacizumab 03/22/2021 Cycle 12 atezolizumab/bevacizumab 04/12/2021 HELD due to increased LFTs Cycle 13 bevacizumab 05/02/2021, atezolizumab held secondary to elevated liver enzymes Cycle 14 bevacizumab 05/23/2021, atezolizumab held secondary to elevated liver enzymes Cycle 15 bevacizumab 06/12/2021, atezolizumab held secondary to elevated liver enzymes Treatment held 07/03/2021 due to progressive rise in liver enzymes, referred for restaging MRI 07/08/2021 MRI abdomen-progression of widespread multifocal intrahepatic hepatocellular carcinoma, developing hepatic vein and IVC invasion and/or tumor thrombus. Lenvatinib 4 mg daily 07/22/2021 08/16/2021 lenvatinib placed on hold Coronary artery disease Atrial fibrillation-maintained on anticoagulation History of basal cell and squamous cell skin cancers Diabetes Anorexia/weight loss Abdominal pain Urinary tract infection 08/30/2020  Disposition: Ms. Kist has multifocal hepatocellular carcinoma.  She began lenvatinib 07/22/2021.  Treatment placed on hold 08/16/2021 due to mucositis and diarrhea.  She has required frequent IV fluids as an outpatient.  She presents today with inability to maintain adequate hydration mainly due to mucositis.  She appears dehydrated.  She and her daughter agree to hospitalization for IV fluids and pain control.  We discussed the overall picture in terms of the [facility] and initiated discussion regarding a home hospice  referral when she is discharged.  We also discussed CODE STATUS.  Ms. Muraoka is undecided regarding CODE STATUS and her daughter will talk with her about this further.  She will be followed by oncology during the hospitalization.  Patient seen with Dr. Benay Spice.   Ned Card ANP/GNP-BC   08/31/2021  1:27 PM Ms. Kelliher was interviewed and examined.  She is here today with her daughter.  We saw her last week when she presented with failure to thrive, mucositis, and dehydration.  The mucositis and mouth/throat pain have persisted.  She appears dehydrated again today.  Her symptoms appear to be related to toxicity from lenvatinib.  Lenvatinib will remain on hold.  She will be admitted for intravenous hydration and symptom management.  Her acute symptoms appear to be related to toxicity from lenvatinib.  However she has underlying advanced stage hepatocellular carcinoma.  I recommend home hospice care when she is discharged in the hospital.  We discussed CODE STATUS with Ms. Dutkiewicz and her daughter.  She will continue this discussion over the next few days.  I recommend admission to the hospitalist service for intravenous hydration and supportive care as she recovers from the lenvatinib toxicity.  I will follow her while in the hospital.  I was present for greater than 50% of today's visit.  I performed medical decision making.

## 2021-08-20 NOTE — H&P (Signed)
History and Physical    Stacy [name] XKG:[id number] DOB: 06/16/1926 DOA: 08/16/2021  PCP: Lillard Anes, MD   Patient coming from: home   Chief Complaint: Dehydration, mouth sores, loose stools   HPI: Stacy [name] is a pleasant 88 y.o. female with medical history significant for CAD, hypertension, history of CVA, paroxysmal atrial fibrillation on Eliquis, CKD stage IV, and hepatocellular carcinoma, now presenting from the cancer center for management of dehydration.  Patient has been experiencing mucositis which has been causing significant pain and keeping her from eating or drinking much.  She has also had some loose stools and has been severely dehydrated.  She received IV fluids at the cancer center on 08/16/2021, 08/17/2021, and 08/18/2021.  Mucositis was attributed to lenvatinib which has been on hold, but despite holding the medication and receiving IV fluids at the cancer center, the patient remained severely dehydrated when she was seen today, and she was directed to the hospital for admission.  She denies abdominal pain, nausea, vomiting, fevers, or chills.  She has been able to take her medications, but has severe pain when trying to eat or drink despite using Magic mouthwash.   Review of Systems:  All other systems reviewed and apart from HPI, are negative.  Past Medical History:  Diagnosis Date   Anxiety    Hyperlipidemia     Past Surgical History:  Procedure Laterality Date   3 epidural steroid injections     APPENDECTOMY     Basal Cell Cancer Removal     forehead   CATARACT EXTRACTION     CATARACT EXTRACTION  12/29/2003   CORONARY ANGIOPLASTY  03-15-1995   CORONARY ANGIOPLASTY WITH STENT PLACEMENT  05-19-1998   CORONARY STENT PLACEMENT  2000   DILATION AND CURETTAGE OF UTERUS     ENDARTERECTOMY Right 04/13/2015   Procedure: ENDARTERECTOMY CAROTID;  Surgeon: Serafina Mitchell, MD;  Location: [facility];  Service: Vascular;  Laterality: Right;   ENDOMETRIAL BIOPSY     EYE  SURGERY     heart catherization     HEMORRHOID SURGERY     INCISIONAL HERNIA REPAIR     INGUINAL HERNIA REPAIR     LOOP RECORDER INSERTION N/A 06/24/2016   Procedure: Loop Recorder Insertion;  Surgeon: Deboraha Sprang, MD;  Location: [facility];  Service: Cardiovascular;  Laterality: N/A;   PATCH ANGIOPLASTY Right 04/13/2015   Procedure: PATCH ANGIOPLASTY;  Surgeon: Serafina Mitchell, MD;  Location: [facility] OR;  Service: Vascular;  Laterality: Right;   PTCA     SQUAMOUS CELL CARCINOMA EXCISION     left lower leg   TEE WITHOUT CARDIOVERSION N/A 06/24/2016   Procedure: TRANSESOPHAGEAL ECHOCARDIOGRAM (TEE);  Surgeon: Dorothy Spark, MD;  Location: [facility] ENDOSCOPY;  Service: Cardiovascular;  Laterality: N/A;   TONSILLECTOMY      Social History:   reports that she quit smoking about 22 years ago. Her smoking use included cigarettes. She has a 54.00 pack-year smoking history. She has never used smokeless tobacco. She reports that she does not currently use alcohol. She reports that she does not use drugs.  Allergies  Allergen Reactions   Iodinated Contrast Media Anaphylaxis, Rash and Other (See Comments)    Edema, also   Iohexol Other (See Comments)     Desc: HIVES- 13 HR PRE-MEDS ARE REQUIRED-ASM- 03/21/05,SULFA,ADHESIVE TAPE    Sulfa Antibiotics Hives and Rash   Sulfonamide Derivatives Hives   Latex Other (See Comments)    Adhesive tape and  EKG adhesive leads to rash   Tape Rash and Other (See Comments)    Adhesive tape = redness   Tranilast Hives    Investigational medication   Lenvima [Lenvatinib] Other (See Comments)    Mouth and throat sores   Metformin Hcl Diarrhea and Other (See Comments)    Diarrhea, anorexia (at higher doses)   Wound Dressing Adhesive Hives    Family History  Problem Relation Age of Onset   Diabetes Father    Prostate cancer Father        meta to colon   Cancer Father        Prostate   Parkinsonism Mother    Stroke Paternal Uncle    Hypertension  Paternal Uncle    Heart attack Neg Hx      Prior to Admission medications   Medication Sig Start Date End Date Taking? Authorizing Provider  apixaban (ELIQUIS) 2.5 MG TABS tablet Take 1 tablet (2.5 mg total) by mouth 2 (two) times daily. 06/14/21  Yes Sherren Mocha, MD  calcium carbonate (TUMS E-X 750) 750 MG chewable tablet Chew 1-2 tablets by mouth daily as needed for heartburn.   Yes [provider]  diphenhydrAMINE (BENADRYL) 25 MG tablet Take 25 mg by mouth every 6 (six) hours as needed for itching or allergies.   Yes [provider]  insulin detemir (LEVEMIR FLEXPEN) 100 UNIT/ML FlexPen Inject 20 Units into the skin daily. Patient taking differently: Inject 4 Units into the skin See admin instructions. Inject 4 units into the skin at bedtime if morning BGL reading was elevated 06/20/21  Yes Lillard Anes, MD  loperamide (IMODIUM A-D) 2 MG tablet Take 2 mg by mouth 4 (four) times daily as needed for diarrhea or loose stools.   Yes [provider]  magic mouthwash (nystatin, diphenhydrAMINE, alum & mag hydroxide) suspension mixture Swish and spit 5-10 mls by mouth 4 times a day 07/31/21  Yes Ladell Pier, MD  metoprolol succinate (TOPROL-XL) 25 MG 24 hr tablet TAKE 1 TABLET (25 MG TOTAL) BY MOUTH IN THE MORNING AND AT BEDTIME. Patient taking differently: Take 37.5 mg by mouth 2 (two) times daily. 08/16/21  Yes Sherren Mocha, MD  nitroGLYCERIN (NITROSTAT) 0.4 MG SL tablet Place 1 tablet (0.4 mg total) under the tongue every 5 (five) minutes as needed for chest pain. 08/19/18  Yes Sherren Mocha, MD  ondansetron (ZOFRAN ODT) 4 MG disintegrating tablet Take 1 tablet (4 mg total) by mouth every 8 (eight) hours as needed for nausea or vomiting. 07/31/21  Yes Owens Shark, NP  PRILOSEC OTC 20 MG tablet Take 20 mg by mouth daily as needed (for reflux).   Yes [provider]  rosuvastatin (CRESTOR) 10 MG tablet TAKE 1 TABLET BY MOUTH EVERY DAY Patient  taking differently: Take 10 mg by mouth daily. 08/15/21  Yes Sherren Mocha, MD  sertraline (ZOLOFT) 50 MG tablet TAKE 1 TABLET BY MOUTH EVERY DAY Patient taking differently: Take 50 mg by mouth every evening. 01/14/21  Yes Lillard Anes, MD  traMADol (ULTRAM) 50 MG tablet Take 0.5-1 tablets (25-50 mg total) by mouth every 12 (twelve) hours as needed. Patient taking differently: Take 25-50 mg by mouth every 12 (twelve) hours as needed (for pain). 08/11/20  Yes Owens Shark, NP  ACCU-CHEK SMARTVIEW test strip 1 each by Other route daily. 03/29/14   [provider]  cetirizine (ZYRTEC) 10 MG tablet TAKE 1 TABLET BY MOUTH EVERY DAY Patient not taking: Reported  on 08/12/2021 01/19/20   Lillard Anes, MD  fluconazole (DIFLUCAN) 100 MG tablet Take 1 tablet (100 mg total) by mouth daily. Patient not taking: Reported on 08/27/2021 07/25/21   Lillard Anes, MD  Insulin Pen Needle (PEN NEEDLES) 31G X 5 MM MISC 1 each by Does not apply route daily in the afternoon. 06/22/21   Lillard Anes, MD  lenvatinib 4 mg daily dose (LENVIMA, 4 MG DAILY DOSE,) capsule Take 1 capsule (4 mg total) by mouth daily. Patient not taking: Reported on 08/13/2021 08/09/21   Ladell Pier, MD  predniSONE (DELTASONE) 10 MG tablet Take 1 tablet (10 mg total) by mouth daily with breakfast. Patient not taking: Reported on 08/19/2021 06/19/21   Ladell Pier, MD    Physical Exam: Vitals:   08/25/2021 1833 08/28/2021 2049  BP: (!) 90/58 (!) 83/66  Pulse: (!) 50 92  Resp: 15 17  Temp: (!) 97.5 F (36.4 C) (!) 97.5 F (36.4 C)  TempSrc: Oral Axillary  SpO2: 94% 96%    Constitutional: NAD, calm, appears frail   Eyes: PERTLA, lids and conjunctivae normal ENMT: Mucous membranes are dry. Mucosal ulcerations.   Neck: supple, no masses  Respiratory: no wheezing, no crackles. No accessory muscle use.  Cardiovascular: S1 & S2 heard, regular rate and rhythm. No extremity edema.   Abdomen: No  distension, no tenderness, soft. Bowel sounds active.  Musculoskeletal: no clubbing / cyanosis. No joint deformity upper and lower extremities.   Skin: no significant rashes, lesions, ulcers. Warm, dry, well-perfused. Neurologic: CN 2-12 grossly intact. Moving all extremities. Alert and oriented.  Psychiatric: Pleasant. Cooperative.    Labs and Imaging on Admission: I have personally reviewed following labs and imaging studies  CBC: Recent Labs  Lab 08/16/21 1346  WBC 11.9*  NEUTROABS 7.1  HGB 15.0  HCT 50.9*  MCV 81.4  PLT 224   Basic Metabolic Panel: Recent Labs  Lab 08/16/21 1346 08/17/21 1130  NA 135 141  K 4.7 5.2*  CL 103 108  CO2 18* 17*  GLUCOSE 175* 152*  BUN 34* 38*  CREATININE 3.07* 2.97*  CALCIUM 8.6* 8.5*   GFR: Estimated Creatinine Clearance: 9.3 mL/min (A) (by C-G formula based on SCr of 2.97 mg/dL (H)). Liver Function Tests: Recent Labs  Lab 08/16/21 1346  AST 252*  ALT 684*  ALKPHOS 199*  BILITOT 2.5*  PROT 5.7*  ALBUMIN 2.8*   No results for input(s): "LIPASE", "AMYLASE" in the last 168 hours. No results for input(s): "AMMONIA" in the last 168 hours. Coagulation Profile: No results for input(s): "INR", "PROTIME" in the last 168 hours. Cardiac Enzymes: No results for input(s): "CKTOTAL", "CKMB", "CKMBINDEX", "TROPONINI" in the last 168 hours. BNP (last 3 results) No results for input(s): "PROBNP" in the last 8760 hours. HbA1C: No results for input(s): "HGBA1C" in the last 72 hours. CBG: No results for input(s): "GLUCAP" in the last 168 hours. Lipid Profile: No results for input(s): "CHOL", "HDL", "LDLCALC", "TRIG", "CHOLHDL", "LDLDIRECT" in the last 72 hours. Thyroid Function Tests: No results for input(s): "TSH", "T4TOTAL", "FREET4", "T3FREE", "THYROIDAB" in the last 72 hours. Anemia Panel: No results for input(s): "VITAMINB12", "FOLATE", "FERRITIN", "TIBC", "IRON", "RETICCTPCT" in the last 72 hours. Urine analysis:    Component  Value Date/Time   COLORURINE YELLOW 06/12/2021 0926   APPEARANCEUR HAZY (A) 06/12/2021 0926   LABSPEC 1.022 06/12/2021 0926   PHURINE 6.5 06/12/2021 0926   GLUCOSEU NEGATIVE 06/12/2021 0926   HGBUR SMALL (A) 06/12/2021 4975  BILIRUBINUR NEGATIVE 06/12/2021 0926   BILIRUBINUR negative 10/25/2020 1118   KETONESUR NEGATIVE 06/12/2021 0926   PROTEINUR 100 (A) 06/12/2021 0926   PROTEINUR 100 (A) 06/12/2021 0926   UROBILINOGEN 0.2 10/25/2020 1118   UROBILINOGEN 0.2 12/28/2011 1557   NITRITE NEGATIVE 06/12/2021 0926   LEUKOCYTESUR LARGE (A) 06/12/2021 0926   Sepsis Labs: @LABRCNTIP (procalcitonin:4,lacticidven:4) )No results found for this or any previous visit (from the past 240 hour(s)).   Radiological Exams on Admission: No results found.   Assessment/Plan   1. AKI superimposed on CKD 4  - SCr has been increasing in setting of diarrhea and mucositis with inability to tolerate adequate oral intake  - Repeat chem panel now, continue IVF hydration, advance diet as tolerated, renally-dose medications    ADDENDUM: Serum bicarbonate is 11 and potassium 6.2. Plan to change IVF to isotonic bicarb, check EKG, treat with insulin/dextrose and Lokelma, and monitor serial potassium levels.    2. Mucositis  - Attributed to lenvatinib which has been on hold  - Continue symptom management with magic mouthwash and chloraseptic spray, advance diet as tolerated    3. Hepatocellular carcinoma  - Treatment with lenvatinib currently on hold d/t mucositis with dehydration   4. PAF  - Continue Eliquis, hold metoprolol until BP improves    5. CAD  - No anginal complaints  - Hold beta-blocker until BP improves    6. Hypertension  - BP low on arrival and antihypertensives held    7. Hx of CVA  - Continue Eliquis for stroke prevention    8. Type II DM  - A1c was 11.4% in May 2023  - Check CBGs and use low-intensity SSI    DVT prophylaxis: Eliquis  Code Status: Full  Level of Care: Level  of care: Med-Surg Family Communication: Daughter at bedside  Disposition Plan:  Patient is from: home  Anticipated d/c is to: Home  Anticipated d/c date is: 08/23/21  Patient currently: Pending tolerance of adequate oral intake  Consults called: none  Admission status: Inpatient     Vianne Bulls, MD [facility]  08/17/2021, 9:40 PM

## 2021-08-20 NOTE — Telephone Encounter (Signed)
Dawn from inpatient take the report. Mrs. Stacy [name] was instructed to go to the ED to be direct admitting room [address] [facility]

## 2021-08-20 NOTE — Patient Instructions (Signed)
Morphine Injection What is this medication? MORPHINE (MOR feen) treats severe pain. It is prescribed when other pain medications have not worked or cannot be tolerated. It works by blocking pain signals in the brain. It belongs to a group of medications called opioids. This medicine may be used for other purposes; ask your health care provider or pharmacist if you have questions. COMMON BRAND NAME(S): Astramorph PF, Duramorph, Duramorph PF, Infumorph, MITIGO What should I tell my care team before I take this medication? They need to know if you have any of these conditions: Bleeding disorder Brain tumor Drug abuse or addiction Head injury Heart disease If you often drink alcohol Low adrenal gland function Lung disease, asthma, or breathing problem Seizures Stomach or intestine problems Take medications that treat or prevent blood clots Taken an MAOI such as Marplan, Nardil, or Parnate in the last 14 days Trouble passing urine An unusual or allergic reaction to morphine, other medications, foods, dyes, or preservatives Pregnant or trying to get pregnant Breast-feeding How should I use this medication? This medication is for injection into a muscle, vein, or under the skin. It is usually given in a hospital or clinic setting. If you get this medication at home, you will be taught how to prepare and give this medication. Use exactly as directed. Take your medication at regular intervals. Do not take your medication more often than directed. Always look at your medication before using it. Do not use the injection if its color is darker than pale yellow or if it is discolored in any other way. Do not use this medication if it is cloudy, thickened, colored, or has solid particles in it. It is important that you put your used needles and syringes in a special sharps container. Do not put them in a trash can. If you do not have a sharps container, call your pharmacist or care team to get  one. Talk to your care team regarding the use of this medication in children. Special care may be needed. Overdosage: If you think you have taken too much of this medicine contact a poison control center or emergency room at once. NOTE: This medicine is only for you. Do not share this medicine with others. What if I miss a dose? If you miss a dose, take it as soon as you can. If it is almost time for your next dose, take only that dose. Do not take double or extra doses. What may interact with this medication? Do not take this medication with any of the following: Linezolid MAOIs like Marplan, Nardil, and Parnate Methylene blue Samidorphan This medication may interact with the following: Alcohol Antihistamines for allergy, cough, and cold Atropine Certain medications for anxiety or sleep Certain medications for bladder problems like oxybutynin, tolterodine Certain medications for depression like amitriptyline, fluoxetine, sertraline, mirtazapine, trazodone Certain medications for migraine headache like almotriptan, eletriptan, frovatriptan, naratriptan, rizatriptan, sumatriptan, zolmitriptan Certain medications for nausea or vomiting like dolasetron, granisetron, ondansetron, palonosetron Certain medications for Parkinson's disease like benztropine, trihexyphenidyl Certain medications for seizures like phenobarbital, primidone Certain medications for stomach problems like dicyclomine, hyoscyamine Certain medications for travel sickness like scopolamine Clopidogrel Diuretics General anesthetics like halothane, isoflurane, methoxyflurane, propofol Ipratropium Medications that relax muscles Other narcotic medications for pain or cough Phenothiazines like chlorpromazine, mesoridazine, prochlorperazine, thioridazine Prasugrel Ticagrelor This list may not describe all possible interactions. Give your health care provider a list of all the medicines, herbs, non-prescription drugs, or  dietary supplements you use. Also tell them if   you smoke, drink alcohol, or use illegal drugs. Some items may interact with your medicine. What should I watch for while using this medication? Tell your care team if your pain does not go away, if it gets worse, or if you have new or a different type of pain. You may develop tolerance to this medication. Tolerance means that you will need a higher dose of the medication for pain relief. Tolerance is normal and is expected if you take this medication for a long time. There are different types of narcotic medications (opioids) for pain. If you take more than one type at the same time, you may have more side effects. Give your care team a list of all medications you use. He or she will tell you how much medication to take. Do not take more medication than directed. Get emergency help right away if you have problems breathing. Do not suddenly stop taking your medication because you may develop a severe reaction. Your body becomes used to the medication. This does NOT mean you are addicted. Addiction is a behavior related to getting and using a medication for a nonmedical reason. If you have pain, you have a medical reason to take pain medication. Your care team will tell you how much medication to take. If your care team wants you to stop the medication, the dose will be slowly lowered over time to avoid any side effects. Talk to your care team about naloxone and how to get it. Naloxone is an emergency medication used for an opioid overdose. An overdose can happen if you take too much opioid. It can also happen if an opioid is taken with some other medications or substances, like alcohol. Know the symptoms of an overdose, like trouble breathing, unusually tired or sleepy, or not being able to respond or wake up. Make sure to tell caregivers and close contacts where it is stored. Make sure they know how to use it. After naloxone is given, you must get emergency help  right away. Naloxone is a temporary treatment. Repeat doses may be needed. You may get drowsy or dizzy. Do not drive, use machinery, or do anything that needs mental alertness until you know how this medication affects you. Do not stand up or sit up quickly, especially if you are an older patient. This reduces the risk of dizzy or fainting spells. Alcohol may interfere with the effect of this medication. Avoid alcoholic drinks. This medication will cause constipation. If you do not have a bowel movement for 3 days, call your care team. Your mouth may get dry. Chewing sugarless gum or sucking hard candy and drinking plenty of water may help. Contact your care team if the problem does not go away or is severe. What side effects may I notice from receiving this medication? Side effects that you should report to your care team as soon as possible: Allergic reactions--skin rash, itching, hives, swelling of the face, lips, tongue, or throat CNS depression--slow or shallow breathing, shortness of breath, feeling faint, dizziness, confusion, difficulty staying awake Low adrenal gland function--nausea, vomiting, loss of appetite, unusual weakness or fatigue, dizziness Low blood pressure--dizziness, feeling faint or lightheaded, blurry vision Side effects that usually do not require medical attention (report to your care team if they continue or are bothersome): Constipation Dizziness Drowsiness Dry mouth Headache Nausea Vomiting This list may not describe all possible side effects. Call your doctor for medical advice about side effects. You may report side effects to FDA at 1-800-FDA-1088.   Where should I keep my medication? Keep out of the reach of children and pets. This medication can be abused. Keep it in a safe place to protect it from theft. Do not share this medication with anyone. Selling or giving away this medication is dangerous and is against the law. If you are using this medication at home,  you will be instructed on how to store this medication. Throw away any unused medication after the expiration date on the label. Discard unused medication and used packaging carefully. Children and pets can be harmed if they find used or lost packages. NOTE: This sheet is a summary. It may not cover all possible information. If you have questions about this medicine, talk to your doctor, pharmacist, or health care provider.  2023 Elsevier/Gold Standard (2020-03-10 00:00:00)  

## 2021-08-21 ENCOUNTER — Inpatient Hospital Stay (HOSPITAL_COMMUNITY): Payer: Medicare Other

## 2021-08-21 ENCOUNTER — Other Ambulatory Visit: Payer: Self-pay

## 2021-08-21 DIAGNOSIS — E872 Acidosis, unspecified: Secondary | ICD-10-CM

## 2021-08-21 DIAGNOSIS — R197 Diarrhea, unspecified: Secondary | ICD-10-CM | POA: Diagnosis not present

## 2021-08-21 DIAGNOSIS — E875 Hyperkalemia: Secondary | ICD-10-CM

## 2021-08-21 DIAGNOSIS — E43 Unspecified severe protein-calorie malnutrition: Secondary | ICD-10-CM | POA: Insufficient documentation

## 2021-08-21 LAB — CBC
HCT: 43.5 % (ref 36.0–46.0)
Hemoglobin: 13.1 g/dL (ref 12.0–15.0)
MCH: 24.7 pg — ABNORMAL LOW (ref 26.0–34.0)
MCHC: 30.1 g/dL (ref 30.0–36.0)
MCV: 81.9 fL (ref 80.0–100.0)
Platelets: 263 10*3/uL (ref 150–400)
RBC: 5.31 MIL/uL — ABNORMAL HIGH (ref 3.87–5.11)
RDW: 21.8 % — ABNORMAL HIGH (ref 11.5–15.5)
WBC: 11.5 10*3/uL — ABNORMAL HIGH (ref 4.0–10.5)
nRBC: 0 % (ref 0.0–0.2)

## 2021-08-21 LAB — GLUCOSE, CAPILLARY
Glucose-Capillary: 247 mg/dL — ABNORMAL HIGH (ref 70–99)
Glucose-Capillary: 149 mg/dL — ABNORMAL HIGH (ref 70–99)
Glucose-Capillary: 140 mg/dL — ABNORMAL HIGH (ref 70–99)
Glucose-Capillary: 155 mg/dL — ABNORMAL HIGH (ref 70–99)
Glucose-Capillary: 118 mg/dL — ABNORMAL HIGH (ref 70–99)

## 2021-08-21 LAB — POTASSIUM: Potassium: 4.9 mmol/L (ref 3.5–5.1)

## 2021-08-21 LAB — COMPREHENSIVE METABOLIC PANEL
ALT: 847 U/L — ABNORMAL HIGH (ref 0–44)
AST: 406 U/L — ABNORMAL HIGH (ref 15–41)
Albumin: 1.6 g/dL — ABNORMAL LOW (ref 3.5–5.0)
Alkaline Phosphatase: 194 U/L — ABNORMAL HIGH (ref 38–126)
Anion gap: 8 (ref 5–15)
BUN: 48 mg/dL — ABNORMAL HIGH (ref 8–23)
CO2: 14 mmol/L — ABNORMAL LOW (ref 22–32)
Calcium: 7.6 mg/dL — ABNORMAL LOW (ref 8.9–10.3)
Chloride: 117 mmol/L — ABNORMAL HIGH (ref 98–111)
Creatinine, Ser: 3.04 mg/dL — ABNORMAL HIGH (ref 0.44–1.00)
GFR, Estimated: 14 mL/min — ABNORMAL LOW (ref >60)
Glucose, Bld: 113 mg/dL — ABNORMAL HIGH (ref 70–99)
Potassium: 4.8 mmol/L (ref 3.5–5.1)
Sodium: 139 mmol/L (ref 135–145)
Total Bilirubin: 1.9 mg/dL — ABNORMAL HIGH (ref 0.3–1.2)
Total Protein: 4.5 g/dL — ABNORMAL LOW (ref 6.5–8.1)

## 2021-08-21 MED ORDER — STERILE WATER FOR INJECTION IV SOLN
INTRAVENOUS | Status: AC
  Filled 2021-08-21 (×3): qty 150
  Filled 2021-08-21: qty 1000
  Filled 2021-08-21: qty 150
  Filled 2021-08-21: qty 1000

## 2021-08-21 MED ORDER — METOPROLOL TARTRATE 5 MG/5ML IV SOLN
5.0000 mg | INTRAVENOUS | Status: AC
Start: 2021-08-21 — End: 2021-08-21
  Administered 2021-08-21: 5 mg via INTRAVENOUS
  Filled 2021-08-21: qty 5

## 2021-08-21 MED ORDER — SODIUM ZIRCONIUM CYCLOSILICATE 10 G PO PACK
10.0000 g | PACK | Freq: Once | ORAL | Status: AC
  Administered 2021-08-21: 10 g via ORAL
  Filled 2021-08-21: qty 1

## 2021-08-21 MED ORDER — MORPHINE SULFATE (CONCENTRATE) 10 MG/0.5ML PO SOLN
5.0000 mg | ORAL | Status: DC | PRN
  Administered 2021-08-22: 5 mg via ORAL
  Filled 2021-08-21: qty 0.5

## 2021-08-21 MED ORDER — DEXTROSE 50 % IV SOLN
1.0000 | Freq: Once | INTRAVENOUS | Status: AC
  Administered 2021-08-21: 50 mL via INTRAVENOUS
  Filled 2021-08-21: qty 50

## 2021-08-21 MED ORDER — SODIUM CHLORIDE 0.9 % IV BOLUS
500.0000 mL | Freq: Once | INTRAVENOUS | Status: AC
  Administered 2021-08-21: 500 mL via INTRAVENOUS

## 2021-08-21 MED ORDER — METOPROLOL TARTRATE 25 MG PO TABS
12.5000 mg | ORAL_TABLET | Freq: Two times a day (BID) | ORAL | Status: DC
  Administered 2021-08-21 – 2021-08-22 (×3): 12.5 mg via ORAL
  Filled 2021-08-21 (×6): qty 1

## 2021-08-21 MED ORDER — ENSURE ENLIVE PO LIQD
237.0000 mL | Freq: Three times a day (TID) | ORAL | Status: DC
  Administered 2021-08-21 – 2021-08-23 (×7): 237 mL via ORAL

## 2021-08-21 MED ORDER — STERILE WATER FOR INJECTION IV SOLN
INTRAVENOUS | Status: DC
  Filled 2021-08-21: qty 150

## 2021-08-21 MED ORDER — METOPROLOL TARTRATE 5 MG/5ML IV SOLN
2.5000 mg | Freq: Once | INTRAVENOUS | Status: AC | PRN
Start: 2021-08-21 — End: 2021-08-21
  Administered 2021-08-21: 2.5 mg via INTRAVENOUS
  Filled 2021-08-21: qty 5

## 2021-08-21 MED ORDER — INSULIN ASPART 100 UNIT/ML IV SOLN
5.0000 [IU] | Freq: Once | INTRAVENOUS | Status: AC
  Administered 2021-08-21: 5 [IU] via INTRAVENOUS

## 2021-08-21 NOTE — Progress Notes (Addendum)
HEMATOLOGY-ONCOLOGY PROGRESS NOTE  ASSESSMENT AND PLAN: Multiple liver masses CT abdomen/pelvis 07/11/2018, low-attenuation liver lesions incompletely characterized without contrast Ultrasound abdomen 07/10/2020-innumerable incompletely assessed hypoechoic masses in the liver MRI abdomen 07/29/2020- innumerable hypervascular liver masses, nodular liver contour, 1.5 cm left adrenal mass Biopsy liver lesion 08/08/2020-hepatocellular carcinoma, moderately differentiated Cycle 1 atezolizumab/bevacizumab 08/18/2020 Cycle 2 atezolizumab/bevacizumab 09/07/2020 Cycle 3 atezolizumab/bevacizumab 09/28/2020 Cycle 4 atezolizumab/bevacizumab 10/19/2020 MRI liver 11/07/2020-numerous hyperenhancing liver masses-stable to mildly improved compared to the MRI 07/29/2020 Cycle 5 atezolizumab/bevacizumab 11/10/2020 Cycle 6 atezolizumab/bevacizumab 12/04/2020 Cycle 7 atezolizumab/bevacizumab 12/26/2020 Cycle 8 atezolizumab/bevacizumab 01/16/2021 Cycle 9 atezolizumab/bevacizumab 02/06/2021 Cycle 10 atezolizumab/bevacizumab 02/26/2021 03/20/2021 MRI abdomen-right greater than left multifocal hepatocellular carcinoma, similar to exam 11/07/2020; new small right pleural effusion. Cycle 11 atezolizumab/bevacizumab 03/22/2021 Cycle 12 atezolizumab/bevacizumab 04/12/2021 HELD due to increased LFTs Cycle 13 bevacizumab 05/02/2021, atezolizumab held secondary to elevated liver enzymes Cycle 14 bevacizumab 05/23/2021, atezolizumab held secondary to elevated liver enzymes Cycle 15 bevacizumab 06/12/2021, atezolizumab held secondary to elevated liver enzymes Treatment held 07/03/2021 due to progressive rise in liver enzymes, referred for restaging MRI 07/08/2021 MRI abdomen-progression of widespread multifocal intrahepatic hepatocellular carcinoma, developing hepatic vein and IVC invasion and/or tumor thrombus. 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 has been admitted to the hospital due to AKI, dehydration, mucositis.  She is on IV fluids and pain medication.  Renal function somewhat improved today.  Continue IV fluids.  Pain not adequately controlled.  Has an order for as needed Chloraseptic and I encouraged nursing to bring it to the bedside.  We will try her on Roxanol 5 mg every 4 hours as needed for pain.  Her acute symptoms appear to be related to toxicity from lenvatinib.  She has advanced stage hepatocellular carcinoma and we discussed a referral to hospice care upon hospital discharge.  She agrees.  TOC referral has been placed to arrange for home hospice through [facility].   Discussed CODE STATUS and ACLS measures with the patient and her daughter.  She agrees to DNR.  There is no need for palliative care consult as goals of care are clear.  Recommendations: 1.  Continue supportive care with IV hydration, pain medication, Chloraseptic/Magic mouthwash 2.  Add Roxanol 5 mg every 4 hours as needed for pain. 3.  Referral to [facility] hospice. 4.  DNR/DNI.  Mikey Bussing  [name] was interviewed and examined.  Her daughter was at the bedside when I saw her earlier this morning.  She continues to have pain related to mucositis.  This has partially improved.  She has renal failure and persistent elevation of the liver enzymes.  The renal failure may be related to hepatorenal syndrome in the setting of advanced hepatocellular carcinoma.  Her daughter agrees with not investigating the renal failure further.  The goal is to provide comfort caret until the mucositis resolves.  She will then go home with hospice care.  We discussed CPR and ACLS.  She agrees to no CODE BLUE status.  I was present for greater than 50% of today's  visit.  I performed medical decision making.  SUBJECTIVE: [name] was seen at the cancer center yesterday and was noted to be dehydrated.  She was admitted to the hospital for AKI, dehydration, mucositis.  This morning, she remains somewhat sleepy but overall  is noted to be doing better.  Continues to have mucositis but has been able to take in some liquids.  The liquids do burn her mouth.  She would like to try some full liquids today.  Oncology History  Hepatocellular carcinoma ([place])  08/12/2020 Initial Diagnosis   Hepatocellular carcinoma ([place])   08/12/2020 Cancer Staging   Staging form: Liver, AJCC 8th Edition - Pathologic: Stage II (pT2, pN0, cM0) - Signed by Ladell Pier, MD on 08/12/2020   08/18/2020 - 06/12/2021 Chemotherapy   Patient is on Treatment Plan : HEPATOCELLULAR Atezolizumab + Bevacizumab q21d      PHYSICAL EXAMINATION:  Vitals:   09/01/2021 2234 08/21/21 0533  BP: (!) 88/68 (!) 93/53  Pulse: 100 (!) 59  Resp: 17 17  Temp: (!) 97.3 F (36.3 C) 98 F (36.7 C)  SpO2: 94% 97%   There were no vitals filed for this visit.  Intake/Output from previous day: 07/10 0701 - 07/11 0700 In: 495.9 [I.V.:495.9] Out: -   Physical Exam Vitals reviewed.  Constitutional:      Appearance: She is ill-appearing.  HENT:     Mouth/Throat:     Comments: Oral mucosa is dry. Ulceration at the right buccal mucosa and posterior palate.  Lesions scattered along the lower lip. Cardiovascular:     Rate and Rhythm: Tachycardia present. Rhythm irregular.  Pulmonary:     Effort: Pulmonary effort is normal. No respiratory distress.  Abdominal:     Palpations: Abdomen is soft.     Tenderness: There is no abdominal tenderness.  Neurological:     Mental Status: She is alert.    LABORATORY DATA:  I have reviewed the data as listed    Latest Ref Rng & Units 08/13/2021    9:47 PM 08/17/2021   11:30 AM 08/16/2021    1:46 PM  CMP  Glucose 70 - 99 mg/dL 105  152  175   BUN 8 - 23 mg/dL 48   38  34   Creatinine 0.44 - 1.00 mg/dL 3.40  2.97  3.07   Sodium 135 - 145 mmol/L 140  141  135   Potassium 3.5 - 5.1 mmol/L 6.2  5.2  4.7   Chloride 98 - 111 mmol/L 117  108  103   CO2 22 - 32 mmol/L '11  17  18   '$ Calcium 8.9 - 10.3 mg/dL 7.7  8.5  8.6   Total Protein 6.5 - 8.1 g/dL 4.5   5.7   Total Bilirubin 0.3 - 1.2 mg/dL 2.1   2.5   Alkaline Phos 38 - 126 U/L 197   199   AST 15 - 41 U/L 357   252   ALT 0 - 44 U/L 844   684     Lab Results  Component Value Date   WBC 11.5 (H) 08/21/2021   HGB 13.1 08/21/2021   HCT 43.5 08/21/2021   MCV 81.9 08/21/2021   PLT 263 08/21/2021   NEUTROABS 7.1 08/16/2021    Lab Results  Component Value Date   CEA 2.45 08/02/2020   RAQ762 27 08/02/2020    US RENAL  Result Date: 08/02/2021 CLINICAL DATA:  HCC, elevated creatinine EXAM: RENAL / URINARY TRACT ULTRASOUND COMPLETE COMPARISON:  MRI 07/08/2021. FINDINGS: Right Kidney: Renal measurements: 9.6 x 3.6 x 4.4 cm = volume: Knee 0.7 mL. There are two simple renal cysts, largest measuring 1.0 cm. Increased renal cortical echogenicity. Left Kidney: Renal measurements: 8.4 x 4.5 x 4.2 cm = volume: 81.7  mL. There is a simple small subcentimeter renal cyst. Increased renal cortical echogenicity. Bladder: Decompressed. Other: Bilateral pleural effusions, right greater than left. Multiple liver lesions as seen on recent MRI. IMPRESSION: No hydronephrosis. Increased renal cortical echogenicity bilaterally, as can be seen in medical renal disease. Bilateral pleural effusions, right greater than left. Multiple liver lesions noted, as seen on prior MRI. Electronically Signed   By: Maurine Simmering M.D.   On: 08/02/2021 13:45     Future Appointments  Date Time Provider [place]  08/22/2021  8:15 AM [facility] PHLEBOTOMIST [facility] None  08/22/2021  8:50 AM Ladell Pier, MD [facility] None  10/09/2021  2:40 PM Sherren Mocha, MD [id number]  10/29/2021 10:00 AM [id number] None   10/31/2021  3:00 PM Lillard Anes, MD [facility] None      LOS: 1 day

## 2021-08-21 NOTE — Progress Notes (Signed)
Initial Nutrition Assessment  DOCUMENTATION CODES:   Severe malnutrition in context of chronic illness  INTERVENTION:   -Ensure Plus High Protein po TID, each supplement provides 350 kcal and 20 grams of protein.   -Recommend using Magic Mouthwash prior to PO intakes  NUTRITION DIAGNOSIS:   Severe Malnutrition related to chronic illness, cancer and cancer related treatments as evidenced by percent weight loss, energy intake < or equal to 75% for > or equal to 1 month, moderate fat depletion, moderate muscle depletion.  GOAL:   Patient will meet greater than or equal to 90% of their needs  MONITOR:   PO intake, Supplement acceptance, Labs, Weight trends, I & O's  REASON FOR ASSESSMENT:   Malnutrition Screening Tool    ASSESSMENT:   88 y.o. female with medical history significant for CAD, hypertension, history of CVA, paroxysmal atrial fibrillation on Eliquis, CKD stage IV, and hepatocellular carcinoma, now presenting from the cancer center for management of dehydration.  Patient has been experiencing mucositis which has been causing significant pain and keeping her from eating or drinking much.  She has also had some loose stools and has been severely dehydrated.  She received IV fluids at the cancer center on 08/16/2021, 08/17/2021, and 08/18/2021.  Mucositis was attributed to lenvatinib which has been on hold, but despite holding the medication and receiving IV fluids at the cancer center, the patient remained severely dehydrated when she was seen today, and she was directed to the hospital for admission.  Patient in room, daughter at bedside. Pt states her pain is improving today. Feels fearful of drinking liquids d/t the pain. SLP has been consulted. Pt has a sore at the back of her tongue which is hard to bypass when drinking. Daughter brought a medication spoon from home which she is using to feed Ensure to patient. Will order Ensure TID as this will provide the most kcals and protein  while on full liquids. Pt has had difficulty with eating since starting Lenvatinib. She developed mouth sores and diarrhea for the past week or so. Daughter states Magic mouthwash was helpful, recommended providing prior to taking in fluids.   Per weight records, pt has lost 8 lbs since 6/20 (6% wt loss x 3.5 weeks, significant for time frame).  Medications reviewed.  Labs reviewed: CBGs: 118-247   NUTRITION - FOCUSED PHYSICAL EXAM:  Flowsheet Row Most Recent Value  Orbital Region Moderate depletion  Upper Arm Region Moderate depletion  Thoracic and Lumbar Region Unable to assess  Buccal Region Moderate depletion  Temple Region Moderate depletion  Clavicle Bone Region Moderate depletion  Clavicle and Acromion Bone Region Moderate depletion  Scapular Bone Region Moderate depletion  Dorsal Hand Moderate depletion  Patellar Region Unable to assess  Anterior Thigh Region Unable to assess  Posterior Calf Region Unable to assess  Edema (RD Assessment) None  Hair Reviewed  Eyes Reviewed  Mouth Unable to assess  [pain, mucositis]  Skin Reviewed       Diet Order:   Diet Order             Diet full liquid Room service appropriate? Yes; Fluid consistency: Thin  Diet effective now                   EDUCATION NEEDS:   No education needs have been identified at this time  Skin:  Skin Assessment: Reviewed RN Assessment  Last BM:  PTA  Height:   Ht Readings from Last 1 Encounters:  08/16/21  $'5\' 2"'J$  (1.575 m)    Weight:   Wt Readings from Last 1 Encounters:  08/17/21 49.9 kg    BMI:  There is no height or weight on file to calculate BMI.  Estimated Nutritional Needs:   Kcal:  1500-1700  Protein:  70-85g  Fluid:  1.7L/day   Clayton Bibles, MS, RD, LDN Inpatient Clinical Dietitian Contact information available via Amion

## 2021-08-21 NOTE — Progress Notes (Signed)
PROGRESS NOTE    Stacy [name]  VZD:[id number] DOB: February 16, 1926 DOA: 09/10/2021 PCP: Lillard Anes, MD    Brief Narrative:   Stacy [name] is a 88 y.o. female with past medical history significant for hepatocellular carcinoma on chemotherapy initially diagnosed 07/2020, CAD, HTN, history of CVA, persistent atrial fibrillation on Eliquis, CKD stage IV, chronic systolic congestive heart failure who presented as a direct admission from the cancer center for concerns of dehydration in the setting of mucositis, diarrhea which has been attributed to her hepatocellular carcinoma treatment which has now been on hold.  Patient reports that mucositis has been causing her significant pain and keeping her from eating or drinking much.  She has been having loose stools and has been receiving IV fluid at the cancer center on 7/6, 7/7 and 7/8.  Mucositis was attributed to Lenvatinib which has been on hold; but that despite IV fluid hydration and treatment at the cancer center she was notably severely dehydrated and sent to the hospital for admission.  Patient denies abdominal pain, no nausea/vomiting, no fever/chills.  She reports has been able to take her medications but has been having severe pain when trying to eat/drink despite using Magic mouthwash.  Assessment & Plan:   Severe dehydration Mucositis Patient presenting from the cancer center as a direct admission with severe dehydration despite 3 days of IV fluid resuscitation at the cancer center.  Patient is suffering from mucositis likely related to recently started lenvatinib for treatment of her underlying hepatocellular carcinoma. --Sodium bicarbonate drip, 125 mL/h --Magic mouthwash --Supportive care  Atrial fibrillation with RVR Earlier this morning, patient was noted to be in A-fib with RVR.  Patient is asymptomatic.  Patient's home metoprolol XL was held on admission due to borderline hypotension. --Start metoprolol tartrate 12.5 mg p.o.  twice daily, with holding parameters --Unfortunately not much else to offer given her advanced age, unable to utilize digoxin given her significant renal dysfunction, and unable to utilize amiodarone due to her hepatocellular carcinoma with transaminitis.  Would not tolerate other medications such as Cardizem given her low blood pressures. --Continue Eliquis --Continue supportive care, IV fluid hydration as above, monitor on telemetry  Hepatocellular carcinoma Transaminitis Initially diagnosed in June 2022, follow-up with medical oncology, Dr. Benay Spice outpatient.  Has completed 15 cycles of chemotherapy, and most recently was started on Levatinib which now seem to have caused her mucositis as above.  Despite this aggressive treatment, repeat imaging notes progression of her cancer.  Discussed with patient and daughter at bedside given her worsening prognostic indicators, they have decided to change CODE STATUS to DNR.  Seen by medical oncology, this morning, Dr. Benay Spice and patient plans to return home under hospice care following hospitalization. --AST 357>406 --ALT 844>847 --AP 197>194 --Tbili 2.1>1.9 --Avoid hepatotoxins, holding home statin --CMP daily  Acute on chronic kidney disease stage IV Metabolic acidosis Patient presenting with a creatinine of 3.40 with a serum bicarbonate 14, baseline 2.4-3.0. --Cr 3.40>3.04 --Continue sodium bicarb infusion at 125 mL/h --Avoid nephrotoxins, renally dose all medications  --Repeat CMP in a.m.  Hyperkalemia Potassium noted to be 6.0 this morning, s/p Lokelma. --K 6.2>4.8 --Continue to monitor on telemetry --Repeat electrolytes in a.m.  Dysphagia --SLP evaluation  Chronic systolic congestive heart failure, compensated Hx essential hypertension Follows with cardiology outpatient.  TTE 08/2020 with LVEF 40-45%, notable LV regional wall motion normalities, IVC normal size.  On metoprolol XL 37.5 mg p.o. twice daily.  CAD --Holding home  statin due to transaminitis  Type 2 diabetes mellitus On Levemir 4 units qHS -- Hold home long-acting insulin given poor oral intake --SSI for coverage --CBGs qAC/HS  Hyperlipidemia: On Crestor 10 mg p.o. daily at home. --Hold statin given elevated LFTs   DVT prophylaxis: apixaban (ELIQUIS) tablet 2.5 mg Start: 08/16/2021 2200 apixaban (ELIQUIS) tablet 2.5 mg    Code Status: DNR Family Communication: Updated daughter present at bedside this morning  Disposition Plan:  Level of care: Med-Surg Status is: Inpatient Remains inpatient appropriate because: IV fluid hydration, pending SLP evaluation, overall very poor prognosis, now DNR with plans to transition to home hospice on discharge.    Consultants:  Medical oncology  Procedures:  None  Antimicrobials:  None   Subjective: Patient seen examined bedside, resting comfortably.  No specific complaints this morning.  Daughter present at bedside.  Continues with sore throat/difficulty swallowing.  Earlier this morning, patient went A-fib with RVR and was noted to have hyperkalemia.  Patient received Lokelma with improvement of potassium.  Daughter requesting speech therapy evaluation due to her dysphagia.  Patient's CODE STATUS was addressed and changed to DNR given her severe comorbidities, progressive cancer and overall poor prognosis.  Seen by medical oncology, Dr. Benay Spice with plan to transition to home hospice on discharge.  No other specific complaints or concerns at this time.  Denies headache, no chest pain, no shortness of breath, no abdominal pain.  No acute events overnight per nursing staff other than notable A-fib with RVR and hyperkalemia on labs this morning.  Objective: Vitals:   08/21/21 0956 08/21/21 1205 08/21/21 1359 08/21/21 1405  BP: (!) 88/60 (!) 87/64 95/69 (!) 88/64  Pulse: (!) 123 (!) 123 (!) 133 (!) 123  Resp: '18 18 16 16  '$ Temp: 98.7 F (37.1 C) (!) 97.5 F (36.4 C) 97.6 F (36.4 C) (!) 97.5 F (36.4  C)  TempSrc:  Axillary Axillary Axillary  SpO2: 97% 93% 95% 96%    Intake/Output Summary (Last 24 hours) at 08/21/2021 1440 Last data filed at 08/21/2021 0600 Gross per 24 hour  Intake 495.9 ml  Output --  Net 495.9 ml   There were no vitals filed for this visit.  Examination:  Physical Exam: GEN: NAD, alert and oriented x 3, wd/wn HEENT: NCAT, PERRL, EOMI, sclera clear, MMM PULM: CTAB w/o wheezes/crackles, normal respiratory effort CV: RRR w/o M/G/R GI: abd soft, NTND, NABS, no R/G/M MSK: no peripheral edema, muscle strength globally intact 5/5 bilateral upper/lower extremities NEURO: CN II-XII intact, no focal deficits, sensation to light touch intact PSYCH: normal mood/affect Integumentary: dry/intact, no rashes or wounds    Data Reviewed: I have personally reviewed following labs and imaging studies  CBC: Recent Labs  Lab 08/16/21 1346 09/06/2021 2147 08/21/21 0643  WBC 11.9* 12.8* 11.5*  NEUTROABS 7.1  --   --   HGB 15.0 13.5 13.1  HCT 50.9* 44.6 43.5  MCV 81.4 81.4 81.9  PLT 257 237 622   Basic Metabolic Panel: Recent Labs  Lab 08/16/21 1346 08/17/21 1130 08/13/2021 2147 08/21/21 0643  NA 135 141 140 139  K 4.7 5.2* 6.2* 4.8  CL 103 108 117* 117*  CO2 18* 17* 11* 14*  GLUCOSE 175* 152* 105* 113*  BUN 34* 38* 48* 48*  CREATININE 3.07* 2.97* 3.40* 3.04*  CALCIUM 8.6* 8.5* 7.7* 7.6*  MG  --   --  2.1  --    GFR: Estimated Creatinine Clearance: 9.1 mL/min (A) (by C-G formula based on SCr of 3.04 mg/dL (H)). Liver  Function Tests: Recent Labs  Lab 08/16/21 1346 08/29/2021 2147 08/21/21 0643  AST 252* 357* 406*  ALT 684* 844* 847*  ALKPHOS 199* 197* 194*  BILITOT 2.5* 2.1* 1.9*  PROT 5.7* 4.5* 4.5*  ALBUMIN 2.8* 1.7* 1.6*   No results for input(s): "LIPASE", "AMYLASE" in the last 168 hours. No results for input(s): "AMMONIA" in the last 168 hours. Coagulation Profile: No results for input(s): "INR", "PROTIME" in the last 168 hours. Cardiac  Enzymes: No results for input(s): "CKTOTAL", "CKMB", "CKMBINDEX", "TROPONINI" in the last 168 hours. BNP (last 3 results) No results for input(s): "PROBNP" in the last 8760 hours. HbA1C: No results for input(s): "HGBA1C" in the last 72 hours. CBG: Recent Labs  Lab 08/15/2021 2228 08/21/21 0725 08/21/21 0844 08/21/21 1138  GLUCAP 118* 247* 149* 140*   Lipid Profile: No results for input(s): "CHOL", "HDL", "LDLCALC", "TRIG", "CHOLHDL", "LDLDIRECT" in the last 72 hours. Thyroid Function Tests: No results for input(s): "TSH", "T4TOTAL", "FREET4", "T3FREE", "THYROIDAB" in the last 72 hours. Anemia Panel: No results for input(s): "VITAMINB12", "FOLATE", "FERRITIN", "TIBC", "IRON", "RETICCTPCT" in the last 72 hours. Sepsis Labs: No results for input(s): "PROCALCITON", "LATICACIDVEN" in the last 168 hours.  No results found for this or any previous visit (from the past 240 hour(s)).       Radiology Studies: No results found.      Scheduled Meds:  apixaban  2.5 mg Oral BID   feeding supplement  237 mL Oral TID BM   insulin aspart  0-6 Units Subcutaneous TID WC   metoprolol tartrate  12.5 mg Oral BID   sertraline  50 mg Oral QPM   Continuous Infusions:  sodium bicarbonate 150 mEq in sterile water 1,150 mL infusion 100 mL/hr at 08/21/21 0758     LOS: 1 day    Time spent: 54 minutes spent on chart review, discussion with nursing staff, consultants, updating family and interview/physical exam; more than 50% of that time was spent in counseling and/or coordination of care.    Pearson Picou J [place] DO [facility] Available via Epic secure chat 7am-7pm After these hours, please refer to coverage provider listed on [email or web address] 08/21/2021, 2:40 PM

## 2021-08-21 NOTE — Progress Notes (Signed)
   08/21/21 0956  Assess: MEWS Score  Temp 98.7 F (37.1 C)  BP (!) 88/60  MAP (mmHg) 71  Pulse Rate (!) 123  Resp 18  SpO2 97 %  Assess: MEWS Score  MEWS Temp 0  MEWS Systolic 1  MEWS Pulse 2  MEWS RR 0  MEWS LOC 0  MEWS Score 3  MEWS Score Color Yellow  Assess: if the MEWS score is Yellow or Red  Were vital signs taken at a resting state? Yes  Focused Assessment No change from prior assessment  Does the patient meet 2 or more of the SIRS criteria? No  MEWS guidelines implemented *See Row Information* Yes  Treat  MEWS Interventions Administered scheduled meds/treatments  Pain Scale 0-10  Pain Score 0  Notify: Charge Nurse/RN  Name of Charge Nurse/RN Notified Monica RN  Date Charge Nurse/RN Notified 08/21/21  Time Charge Nurse/RN Notified 1000  Notify: Provider  Provider Name/Title Eric [place] DO  Date Provider Notified 08/21/21  Time Provider Notified 1000  Method of Notification Page  Notification Reason Change in status  Provider response Other (Comment) (administer scheduled metoprolol per [facility])  Date of Provider Response 08/21/21  Time of Provider Response 1002  Assess: SIRS CRITERIA  SIRS Temperature  0  SIRS Pulse 1  SIRS Respirations  0  SIRS WBC 0  SIRS Score Sum  1    No change from previous assessment.  Pt remains tachy and BP soft.  MD and charge RN notified.

## 2021-08-22 ENCOUNTER — Ambulatory Visit: Payer: Medicare Other | Admitting: Oncology

## 2021-08-22 ENCOUNTER — Encounter: Payer: Self-pay | Admitting: General Practice

## 2021-08-22 ENCOUNTER — Other Ambulatory Visit: Payer: Medicare Other

## 2021-08-22 DIAGNOSIS — R197 Diarrhea, unspecified: Secondary | ICD-10-CM | POA: Diagnosis not present

## 2021-08-22 LAB — COMPREHENSIVE METABOLIC PANEL
ALT: 935 U/L — ABNORMAL HIGH (ref 0–44)
AST: 423 U/L — ABNORMAL HIGH (ref 15–41)
Albumin: 1.5 g/dL — ABNORMAL LOW (ref 3.5–5.0)
Alkaline Phosphatase: 213 U/L — ABNORMAL HIGH (ref 38–126)
Anion gap: 11 (ref 5–15)
BUN: 46 mg/dL — ABNORMAL HIGH (ref 8–23)
CO2: 20 mmol/L — ABNORMAL LOW (ref 22–32)
Calcium: 7.2 mg/dL — ABNORMAL LOW (ref 8.9–10.3)
Chloride: 109 mmol/L (ref 98–111)
Creatinine, Ser: 2.86 mg/dL — ABNORMAL HIGH (ref 0.44–1.00)
GFR, Estimated: 15 mL/min — ABNORMAL LOW (ref >60)
Glucose, Bld: 109 mg/dL — ABNORMAL HIGH (ref 70–99)
Potassium: 4.2 mmol/L (ref 3.5–5.1)
Sodium: 140 mmol/L (ref 135–145)
Total Bilirubin: 2.1 mg/dL — ABNORMAL HIGH (ref 0.3–1.2)
Total Protein: 4.3 g/dL — ABNORMAL LOW (ref 6.5–8.1)

## 2021-08-22 LAB — GLUCOSE, CAPILLARY
Glucose-Capillary: 93 mg/dL (ref 70–99)
Glucose-Capillary: 100 mg/dL — ABNORMAL HIGH (ref 70–99)
Glucose-Capillary: 82 mg/dL (ref 70–99)
Glucose-Capillary: 172 mg/dL — ABNORMAL HIGH (ref 70–99)

## 2021-08-22 LAB — CBC
HCT: 44.9 % (ref 36.0–46.0)
Hemoglobin: 14 g/dL (ref 12.0–15.0)
MCH: 25.3 pg — ABNORMAL LOW (ref 26.0–34.0)
MCHC: 31.2 g/dL (ref 30.0–36.0)
MCV: 81.2 fL (ref 80.0–100.0)
Platelets: 254 10*3/uL (ref 150–400)
RBC: 5.53 MIL/uL — ABNORMAL HIGH (ref 3.87–5.11)
RDW: 22 % — ABNORMAL HIGH (ref 11.5–15.5)
WBC: 10.8 10*3/uL — ABNORMAL HIGH (ref 4.0–10.5)
nRBC: 0.4 % — ABNORMAL HIGH (ref 0.0–0.2)

## 2021-08-22 LAB — T4, FREE: Free T4: 0.62 ng/dL (ref 0.61–1.12)

## 2021-08-22 LAB — TSH: TSH: 6.315 u[IU]/mL — ABNORMAL HIGH (ref 0.350–4.500)

## 2021-08-22 MED ORDER — SODIUM CHLORIDE 0.9 % IV BOLUS
500.0000 mL | Freq: Once | INTRAVENOUS | Status: AC
Start: 2021-08-22 — End: 2021-08-22
  Administered 2021-08-22: 500 mL via INTRAVENOUS

## 2021-08-22 MED ORDER — SODIUM CHLORIDE 0.9 % IV BOLUS
500.0000 mL | Freq: Once | INTRAVENOUS | Status: AC
  Administered 2021-08-22: 500 mL via INTRAVENOUS

## 2021-08-22 MED ORDER — OXYCODONE HCL 5 MG/5ML PO SOLN: 2.5000 mg | ORAL | Status: DC | PRN

## 2021-08-22 MED ORDER — METOPROLOL TARTRATE 5 MG/5ML IV SOLN
2.5000 mg | Freq: Four times a day (QID) | INTRAVENOUS | Status: AC | PRN
  Administered 2021-08-23: 2.5 mg via INTRAVENOUS
  Filled 2021-08-22: qty 5

## 2021-08-22 MED ORDER — SODIUM CHLORIDE 0.9 % IV BOLUS: 500.0000 mL | Freq: Once | INTRAVENOUS | Status: DC

## 2021-08-22 NOTE — TOC Initial Note (Signed)
Transition of Care [facility]) - Initial/Assessment Note    Patient Details  Name: Stacy [name] MRN: [id number] Date of Birth: Nov 10, 1926  Transition of Care [facility]) CM/SW Contact:    Lynnell Catalan, RN Phone Number: 08/22/2021, 10:32 AM  Clinical Narrative:                 Had received TOC consult for home with hospice. Now after reading Oncology note from today it appears pt may have a hospital death. We will hold off on hospice referral for now. TOC will continue to follow.              Activities of Daily Living Home Assistive Devices/Equipment: Gilford Rile (specify type) ADL Screening (condition at time of admission) Patient's cognitive ability adequate to safely complete daily activities?: No Is the patient deaf or have difficulty hearing?: Yes Does the patient have difficulty seeing, even when wearing glasses/contacts?: No Does the patient have difficulty concentrating, remembering, or making decisions?: Yes Patient able to express need for assistance with ADLs?: Yes Does the patient have difficulty dressing or bathing?: Yes Independently performs ADLs?: No Communication: Independent Dressing (OT): Needs assistance Is this a change from baseline?: Pre-admission baseline Grooming: Needs assistance Is this a change from baseline?: Pre-admission baseline Feeding: Independent Bathing: Needs assistance Is this a change from baseline?: Pre-admission baseline Toileting: Needs assistance Is this a change from baseline?: Change from baseline, expected to last >3days In/Out Bed: Needs assistance Is this a change from baseline?: Change from baseline, expected to last >3 days Walks in Home: Needs assistance Is this a change from baseline?: Change from baseline, expected to last >3 days Does the patient have difficulty walking or climbing stairs?: Yes Weakness of Legs: Both Weakness of Arms/Hands: None  Permission Sought/Granted                  Emotional Assessment               Admission diagnosis:  Diarrhea with dehydration [R19.7] Patient Active Problem List   Diagnosis Date Noted   Protein-calorie malnutrition, severe 08/21/2021   Hyperkalemia    Metabolic acidosis    History of CVA (cerebrovascular accident) 09/08/2021   Mucositis 09/01/2021   Diarrhea with dehydration 08/21/2021   CRF (chronic renal failure), stage 4 (severe) ([place]) 06/20/2021   Depression, major, single episode, moderate ([place]) 03/08/2021   Hepatocellular carcinoma ([place]) 08/12/2020   Goals of care, counseling/discussion 08/12/2020   Atypical atrial flutter ([place]) 08/03/2020   Multinodular goiter 02/24/2020   Renal artery stenosis ([place]) 02/24/2020   Insulin dependent type 2 diabetes mellitus ([place]) 02/24/2020   Closed fracture of base of first metacarpal bone of left hand 07/01/2019   Fracture of phalanx of left thumb 06/17/2019   Open wound of right lower leg 06/17/2019   Obesity, diabetes, and hypertension syndrome ([place]) 04/20/2019   Mixed conductive and sensorineural hearing loss, bilateral 04/20/2019   Hemiparesis affecting left side as late effect of cerebrovascular accident ([place]) 04/20/2019   Bilateral nonexudative age-related macular degeneration 04/20/2019   Diabetic glomerulopathy ([place]) 04/20/2019   Secondary hypercoagulable state ([place]) 01/01/2019   Paroxysmal atrial fibrillation ([place]) 08/05/2016   Acute ischemic stroke ([place]) 06/23/2016   Carotid artery stenosis 04/13/2015   Insomnia 04/25/2012   Pain in limb 12/09/2011   Hyperlipidemia 06/18/2008   RENAL ARTERY STENOSIS 06/18/2008   GASTROESOPHAGEAL REFLUX DISEASE 06/18/2008   PEPTIC ULCER DISEASE, HX OF 06/18/2008   CATARACT EXTRACTIONS, BILATERAL, HX OF 06/18/2008   Other acquired  absence of organ 06/18/2008   PERCUTANEOUS TRANSLUMINAL CORONARY ANGIOPLASTY, HX OF 06/18/2008   Other postprocedural status(V45.89) 06/18/2008   Essential hypertension 05/16/2007   Coronary atherosclerosis 05/16/2007   ARTHRITIS  05/16/2007   OSTEOPENIA 05/16/2007   HEMORRHOIDS 11/18/2006   PCP:  Lillard Anes, MD Pharmacy:   [facility] [address] [place] [address] Phone: [phone] Fax: [phone]  [address] [place] [address] Phone: [phone] Fax: [phone]     Social Determinants of Health (SDOH) Interventions    Readmission Risk Interventions     No data to display

## 2021-08-22 NOTE — Progress Notes (Signed)
   08/22/21 2212  Assess: MEWS Score  Temp 97.6 F (36.4 C)  BP (!) 72/55  MAP (mmHg) (!) 61  Pulse Rate (!) 59  Resp 16  SpO2 94 %  O2 Device Room Air  Assess: MEWS Score  MEWS Temp 0  MEWS Systolic 2  MEWS Pulse 0  MEWS RR 0  MEWS LOC 0  MEWS Score 2  MEWS Score Color Yellow  Assess: if the MEWS score is Yellow or Red  Were vital signs taken at a resting state? Yes  Focused Assessment No change from prior assessment  Does the patient meet 2 or more of the SIRS criteria? No  MEWS guidelines implemented *See Row Information* Yes  Treat  Pain Scale 0-10  Pain Score 0  Take Vital Signs  Increase Vital Sign Frequency  Yellow: Q 2hr X 2 then Q 4hr X 2, if remains yellow, continue Q 4hrs  Escalate  MEWS: Escalate Yellow: discuss with charge nurse/RN and consider discussing with provider and RRT  Notify: Charge Nurse/RN  Name of Charge Nurse/RN Notified Librarian, academic, Therapist, sports.  Date Charge Nurse/RN Notified 08/22/21  Time Charge Nurse/RN Notified 2311  Assess: SIRS CRITERIA  SIRS Temperature  0  SIRS Pulse 0  SIRS Respirations  0  SIRS WBC 0  SIRS Score Sum  0

## 2021-08-22 NOTE — Progress Notes (Signed)
[place] Spiritual Care Note  Received referral from [facility] staff to support our own Luberta Mutter, director of radiation oncology, who is Ms Batalla's daughter. Met with Rodena Piety and close family friends Vaughan Basta and Pamala Hurry at Ms Boedecker's bedside.  Provided pastoral presence, empathic listening, emotional support, and prayer at beside. Ms Pellegrini was rousable and appreciative of visit. Group reminisced together as chaplain served as a witness to their love and the sacredness of their stories.  Ensured that Rodena Piety knows how to reach me directly whenever needed and will follow her for ongoing support.   [place], [place], [facility] Pager [phone] Voicemail [phone]

## 2021-08-22 NOTE — Progress Notes (Signed)
PROGRESS NOTE  Stacy [name]  DOB: Feb 28, 1926  PCP: Lillard Anes, MD PZW:[id number]  DOA: 08/28/2021  LOS: 2 days  Hospital Day: 3  Brief narrative: Stacy [name] is a 88 y.o. female with PMH significant for hepatocellular carcinoma on chemotherapy initially diagnosed 07/2020, CAD, HTN, history of CVA, persistent atrial fibrillation on Eliquis, CKD stage IV, chronic systolic congestive heart failure. Patient was receiving immunotherapy with lenvatinib for hepatocarcinoma under the care of oncology Dr. Benay Spice. Recently, she started having mucositis, significant oral pain, inability to eat or drink.  Also had diarrhea and dehydration. She was seen at cancer center and received IV fluid on 7/6, 7/7 and 7/8. On 7/10, patient presented to cancer center for progressively worsening symptoms and hence she was directly admitted inpatient to hospitalist service. Patient's symptoms mildly improved but she also started having multiorgan failure with renal failure, liver enzyme elevation.  Based on her poor performance status and poor chance of recovery, decision was made by family to make her comfort care.  Subjective: Patient was seen and examined this morning.  Pleasant elderly thin female.  Lying on bed.  Not in any distress.  Daughter at bedside. Chart reviewed Tachycardic to 140s this morning  Assessment and plan: Severe dehydration Mucositis -Continue supportive care with IV hydration, pain meds, Chloraseptic/Magic mouthwash, low-dose of narcotic.    Atrial fibrillation with RVR -Heart rate in 140s this morning.  -Currently on metoprolol 12.5 mg twice daily but unable to increase the dose because of coexisting hypotension.  Heart rate improved down to 70s this afternoon.   Hepatocellular carcinoma Initially diagnosed in June 2022, follow-up with medical oncology, Dr. Benay Spice outpatient.  Has completed 15 cycles of chemotherapy, and most recently was started on Levatinib which now  seem to have caused her mucositis as above.   -Patient has poor prognosis.  Recommend comfort care.  Patient's daughter wants to wait to see any improvement for another day before making a decision of comfort care.  Transaminitis -Liver enzymes worsening.  Continue to monitor -Statin on hold. Recent Labs  Lab 08/16/21 1346 09/07/2021 2147 08/21/21 0643 08/22/21 0741  AST 252* 357* 406* 423*  ALT 684* 844* 847* 935*  ALKPHOS 199* 197* 194* 213*  BILITOT 2.5* 2.1* 1.9* 2.1*  PROT 5.7* 4.5* 4.5* 4.3*  ALBUMIN 2.8* 1.7* 1.6* 1.5*    Acute on chronic kidney disease stage IV Metabolic acidosis -Patient presented with a creatinine of 3.40 with a serum bicarbonate 14, baseline 2.4-3.0. -Creatinine gradually improving with IV fluid.   Recent Labs    06/12/21 0926 06/19/21 1056 07/03/21 1016 07/31/21 1300 08/01/21 1118 08/16/21 1346 08/17/21 1130 08/12/2021 2147 08/21/21 0643 08/22/21 0741  BUN 25* 16 27* 30* 26* 34* 38* 48* 48* 46*  CREATININE 1.75* 2.11* 2.14* 2.40* 2.26* 3.07* 2.97* 3.40* 3.04* 2.86*  CO2 20* 18* 21* 24 26 18* 17* 11* 14* 20*    Dysphagia -SLP evaluation   Chronic systolic congestive heart failure, compensated Hx essential hypertension Follows with cardiology outpatient.  TTE 08/2020 with LVEF 40-45%, notable LV regional wall motion normalities, IVC normal size.  On metoprolol XL 37.5 mg p.o. twice daily.   CAD HLD -Crestor on hold due to transaminitis   Type 2 diabetes mellitus -PT on Levemir 4 units qHS -Hold home long-acting insulin given poor oral intake -SSI for coverage -CBGs qAC/HS  Goals of care   Code Status: DNR    Mobility: Limited mobility  Skin assessment:     Nutritional status:  Body mass index is 24.59 kg/m.  Nutrition Problem: Severe Malnutrition Etiology: chronic illness, cancer and cancer related treatments Signs/Symptoms: percent weight loss, energy intake < or equal to 75% for > or equal to 1 month, moderate fat  depletion, moderate muscle depletion     Diet:  Diet Order             Diet full liquid Room service appropriate? Yes; Fluid consistency: Thin  Diet effective now                   DVT prophylaxis:  apixaban (ELIQUIS) tablet 2.5 mg Start: 08/13/2021 2200 apixaban (ELIQUIS) tablet 2.5 mg   Antimicrobials: None Fluid: Sodium bicarb at 125 mill per hour Consultants: Oncology Family Communication: Daughter at bedside  Status is: Inpatient  Continue in-hospital care because: Multiorgan failure.  Poor prognosis Level of care: Med-Surg   Dispo: The patient is from: Home              Anticipated d/c is to: Poor prognosis.  May be headed towards comfort care.  Daughter to make a decision in 1 to 2 days              Patient currently is not medically stable to d/c.   Difficult to place patient No     Infusions:   sodium bicarbonate 150 mEq in sterile water 1,150 mL infusion 125 mL/hr at 08/21/21 1837    Scheduled Meds:  apixaban  2.5 mg Oral BID   feeding supplement  237 mL Oral TID BM   insulin aspart  0-6 Units Subcutaneous TID WC   metoprolol tartrate  12.5 mg Oral BID    PRN meds: acetaminophen **OR** acetaminophen, diphenhydrAMINE, magic mouthwash, metoprolol tartrate, oxyCODONE, pantoprazole, phenol, traMADol   Antimicrobials: Anti-infectives (From admission, onward)    None       Objective: Vitals:   08/22/21 1235 08/22/21 1300  BP: 100/72 100/60  Pulse:  79  Resp:  18  Temp:  (!) 97.4 F (36.3 C)  SpO2:  93%    Intake/Output Summary (Last 24 hours) at 08/22/2021 1650 Last data filed at 08/22/2021 0434 Gross per 24 hour  Intake 1645.83 ml  Output 48 ml  Net 1597.83 ml   Filed Weights   08/21/21 1405 08/22/21 0350  Weight: 49.9 kg 61 kg   Weight change:  Body mass index is 24.59 kg/m.   Physical Exam: General exam: Elderly Caucasian female.  Not in physical distress Skin: No rashes, lesions or ulcers. HEENT: Atraumatic, normocephalic,  no obvious bleeding Lungs: Diminished air entry both bases CVS: Tachycardic, irregular rhythm, no murmur GI/Abd soft, nontender, nondistended, bowel sound present CNS: Tries to open eyes on verbal command, Psychiatry: Sad affect Extremities: No pedal edema, no calf tenderness  Data Review: I have personally reviewed the laboratory data and studies available.  F/u labs ordered Unresulted Labs (From admission, onward)     Start     Ordered   08/23/21 0500  CBC with Differential/Platelet  Tomorrow morning,   R       Question:  Specimen collection method  Answer:  Lab=Lab collect   08/22/21 1650            Signed, Terrilee Croak, MD [facility] 08/22/2021

## 2021-08-22 NOTE — Progress Notes (Signed)
   08/21/21 2034  Vitals  Temp (!) 97.5 F (36.4 C)  Temp Source Axillary  BP (!) 87/66  MAP (mmHg) 74  BP Location Right Arm  BP Method Automatic  Patient Position (if appropriate) Lying  Pulse Rate (!) 136  Pulse Rate Source Monitor  Resp 19  MEWS COLOR  MEWS Score Color Red  Oxygen Therapy  SpO2 94 %  O2 Device Room Air  Pain Assessment  Pain Scale 0-10  Pain Score 0  MEWS Score  MEWS Temp 0  MEWS Systolic 1  MEWS Pulse 3  MEWS RR 0  MEWS LOC 0  MEWS Score 4  Provider Notification  Provider Name/Title Olena Heckle NP  Date Provider Notified 08/21/21  Time Provider Notified 2039  Notification Reason Change in status  Provider response See new orders  Date of Provider Response 08/21/21  Time of Provider Response 2048   Patient with Red Mews due to elevated HR and low BP. Provider ordered 500cc bolus of Nacl. BP improved to systolic in the 09'W but HR remained elevated. 2.5 mg IV Metoprolol administered per order. Currently on second bolus of 500cc Nacl due to low BP. Patient denies any pain. Daughter is at the bedside.

## 2021-08-22 NOTE — Evaluation (Signed)
SLP Cancellation Note  Patient Details Name: SALENA ORTLIEB MRN: [id number] DOB: 1927/01/28   Cancelled treatment:       Reason Eval/Treat Not Completed: Other (comment) (Await clarification of order, spoke to RN Apolonio Schneiders) and noted pt now comfort)   Macario Golds 08/22/2021, 10:28 AM  Kathleen Lime, MS [place] Office [phone] Pager [phone]

## 2021-08-22 NOTE — Progress Notes (Signed)
Per dr dahal may dc telemetry and not follow mews protocol for patient

## 2021-08-22 NOTE — Progress Notes (Signed)
HEMATOLOGY-ONCOLOGY PROGRESS NOTE  ASSESSMENT AND PLAN: Multiple liver masses CT abdomen/pelvis 07/11/2018, low-attenuation liver lesions incompletely characterized without contrast Ultrasound abdomen 07/10/2020-innumerable incompletely assessed hypoechoic masses in the liver MRI abdomen 07/29/2020- innumerable hypervascular liver masses, nodular liver contour, 1.5 cm left adrenal mass Biopsy liver lesion 08/08/2020-hepatocellular carcinoma, moderately differentiated Cycle 1 atezolizumab/bevacizumab 08/18/2020 Cycle 2 atezolizumab/bevacizumab 09/07/2020 Cycle 3 atezolizumab/bevacizumab 09/28/2020 Cycle 4 atezolizumab/bevacizumab 10/19/2020 MRI liver 11/07/2020-numerous hyperenhancing liver masses-stable to mildly improved compared to the MRI 07/29/2020 Cycle 5 atezolizumab/bevacizumab 11/10/2020 Cycle 6 atezolizumab/bevacizumab 12/04/2020 Cycle 7 atezolizumab/bevacizumab 12/26/2020 Cycle 8 atezolizumab/bevacizumab 01/16/2021 Cycle 9 atezolizumab/bevacizumab 02/06/2021 Cycle 10 atezolizumab/bevacizumab 02/26/2021 03/20/2021 MRI abdomen-right greater than left multifocal hepatocellular carcinoma, similar to exam 11/07/2020; new small right pleural effusion. Cycle 11 atezolizumab/bevacizumab 03/22/2021 Cycle 12 atezolizumab/bevacizumab 04/12/2021 HELD due to increased LFTs Cycle 13 bevacizumab 05/02/2021, atezolizumab held secondary to elevated liver enzymes Cycle 14 bevacizumab 05/23/2021, atezolizumab held secondary to elevated liver enzymes Cycle 15 bevacizumab 06/12/2021, atezolizumab held secondary to elevated liver enzymes Treatment held 07/03/2021 due to progressive rise in liver enzymes, referred for restaging MRI 07/08/2021 MRI abdomen-progression of widespread multifocal intrahepatic hepatocellular carcinoma, developing hepatic vein and IVC invasion and/or tumor thrombus. Lenvatinib 4 mg daily 07/22/2021 08/16/2021 lenvatinib placed on hold Coronary artery disease Atrial fibrillation-maintained on  anticoagulation History of basal cell and squamous cell skin cancers Diabetes Anorexia/weight loss Abdominal pain Urinary tract infection 08/30/2020 Hospital admission 09/07/2021 - AKI, mucositis  Stacy [name] is a history of multifocal hepatocellular carcinoma.  She was admitted earlier this week with mucositis and dehydration.  She has persistent oral mucositis, but this has improved.  However her performance status has not improved.  She has multiorgan failure with renal failure and liver enzyme abnormalities.  I suspect the lethargy and alteration of her mental status are related to renal failure, hepatic encephalopathy, and polypharmacy.  I discussed the prognosis with her daughter.  She understands Stacy [name] may pass away in the hospital, potentially in the next 1-2 days.  The plan is to discharge her to home with hospice care if her condition stabilizes.  There is no plan for aggressive intervention such as further evaluation of the renal failure.  Recommendations: 1.  Continue supportive care with IV hydration, pain medication, Chloraseptic/Magic mouthwash 2.  Try lower dose of narcotic as needed for pain. 3.  Referral to [facility], daughter would like to meet with the hospice team today 4.  DNR/DNI. 5.  Consider trial of a beta-blocker for rate control  [name]  SUBJECTIVE: Stacy [name] is lethargic this morning.  Her daughter is at the bedside.  She received a dose of morphine at approximately 1:30 AM.  She denies pain at present.    Oncology History  Hepatocellular carcinoma ([place])  08/12/2020 Initial Diagnosis   Hepatocellular carcinoma ([place])   08/12/2020 Cancer Staging   Staging form: Liver, AJCC 8th Edition - Pathologic: Stage II (pT2, pN0, cM0) - Signed by Ladell Pier, MD on 08/12/2020   08/18/2020 - 06/12/2021 Chemotherapy   Patient is on Treatment Plan : HEPATOCELLULAR Atezolizumab + Bevacizumab q21d      PHYSICAL EXAMINATION: HEENT: The mouth is dry,  ulcer at the right buccal mucosa and right pharynx, no thrush Lungs: Distant breath sounds, no respiratory distress Cardiac: Tachycardia, irregular Abdomen: No hepatosplenomegaly, nontender Vascular: No leg edema Neurologic: Lethargic, arousable, follows commands, moves all extremities to command   Vitals:   08/22/21 0350 08/22/21 0715  BP: 93/64   Pulse: 63 (!) 145  Resp:  19 18  Temp: 97.6 F (36.4 C) 97.8 F (36.6 C)  SpO2: 95% 96%   Filed Weights   08/21/21 1405 08/22/21 0350  Weight: 110 lb 0.2 oz (49.9 kg) 134 lb 7.7 oz (61 kg)    Intake/Output from previous day: 07/11 0701 - 07/12 0700 In: 1645.8 [I.V.:1645.8] Out: 48 [Urine:48]   LABORATORY DATA:  I have reviewed the data as listed    Latest Ref Rng & Units 08/21/2021    2:25 PM 08/21/2021    6:43 AM 08/21/2021    9:47 PM  CMP  Glucose 70 - 99 mg/dL  113  105   BUN 8 - 23 mg/dL  48  48   Creatinine 0.44 - 1.00 mg/dL  3.04  3.40   Sodium 135 - 145 mmol/L  139  140   Potassium 3.5 - 5.1 mmol/L 4.9  4.8  6.2   Chloride 98 - 111 mmol/L  117  117   CO2 22 - 32 mmol/L  14  11   Calcium 8.9 - 10.3 mg/dL  7.6  7.7   Total Protein 6.5 - 8.1 g/dL  4.5  4.5   Total Bilirubin 0.3 - 1.2 mg/dL  1.9  2.1   Alkaline Phos 38 - 126 U/L  194  197   AST 15 - 41 U/L  406  357   ALT 0 - 44 U/L  847  844     Lab Results  Component Value Date   WBC 11.5 (H) 08/21/2021   HGB 13.1 08/21/2021   HCT 43.5 08/21/2021   MCV 81.9 08/21/2021   PLT 263 08/21/2021   NEUTROABS 7.1 08/16/2021    Lab Results  Component Value Date   CEA 2.45 08/02/2020   [id number] 27 08/02/2020    DG Lumbar Spine 2-3 Views  Result Date: 08/21/2021 CLINICAL DATA:  Low back pain. EXAM: LUMBAR SPINE - 2-3 VIEW COMPARISON:  Reformats from abdominopelvic CT 07/10/2020 FINDINGS: Bones diffusely under mineralized. There are 5 non-rib-bearing lumbar vertebra with diminutive ribs at T12. Chronic L1 compression fracture, however slight progressive loss of  height from prior CT. Undulation of T11 superior endplate may represent a Schmorl's node or mild compression fracture, not definitively seen on prior. Degenerative grade 1 anterolisthesis of L4 on L5. Diffuse degenerative disc disease. Mild multilevel facet hypertrophy. Aortic atherosclerosis. IMPRESSION: 1. Chronic L1 compression fracture with slight progressive loss of height from prior CT. 2. Undulation of T11 superior endplate may represent a Schmorl's node or mild compression fracture, not definitively seen on prior. Electronically Signed   By: Keith Rake M.D.   On: 08/21/2021 17:02   US RENAL  Result Date: 08/02/2021 CLINICAL DATA:  HCC, elevated creatinine EXAM: RENAL / URINARY TRACT ULTRASOUND COMPLETE COMPARISON:  MRI 07/08/2021. FINDINGS: Right Kidney: Renal measurements: 9.6 x 3.6 x 4.4 cm = volume: Knee 0.7 mL. There are two simple renal cysts, largest measuring 1.0 cm. Increased renal cortical echogenicity. Left Kidney: Renal measurements: 8.4 x 4.5 x 4.2 cm = volume: 81.7 mL. There is a simple small subcentimeter renal cyst. Increased renal cortical echogenicity. Bladder: Decompressed. Other: Bilateral pleural effusions, right greater than left. Multiple liver lesions as seen on recent MRI. IMPRESSION: No hydronephrosis. Increased renal cortical echogenicity bilaterally, as can be seen in medical renal disease. Bilateral pleural effusions, right greater than left. Multiple liver lesions noted, as seen on prior MRI. Electronically Signed   By: Maurine Simmering M.D.   On: 08/02/2021 13:45  Future Appointments  Date Time Provider [place]  10/09/2021  2:40 PM Sherren Mocha, MD [facility]  10/29/2021 10:00 AM [id number] None  10/31/2021  3:00 PM Lillard Anes, MD [facility] None      LOS: 2 days

## 2021-08-23 DIAGNOSIS — R197 Diarrhea, unspecified: Secondary | ICD-10-CM | POA: Diagnosis not present

## 2021-08-23 LAB — CBC WITH DIFFERENTIAL/PLATELET
Abs Immature Granulocytes: 0.04 10*3/uL (ref 0.00–0.07)
Basophils Absolute: 0 10*3/uL (ref 0.0–0.1)
Basophils Relative: 0 %
Eosinophils Absolute: 0 10*3/uL (ref 0.0–0.5)
Eosinophils Relative: 0 %
HCT: 46.6 % — ABNORMAL HIGH (ref 36.0–46.0)
Hemoglobin: 14.3 g/dL (ref 12.0–15.0)
Immature Granulocytes: 0 %
Lymphocytes Relative: 20 %
Lymphs Abs: 2.5 10*3/uL (ref 0.7–4.0)
MCH: 24.7 pg — ABNORMAL LOW (ref 26.0–34.0)
MCHC: 30.7 g/dL (ref 30.0–36.0)
MCV: 80.3 fL (ref 80.0–100.0)
Monocytes Absolute: 0.2 10*3/uL (ref 0.1–1.0)
Monocytes Relative: 2 %
Neutro Abs: 9.7 10*3/uL — ABNORMAL HIGH (ref 1.7–7.7)
Neutrophils Relative %: 78 %
Platelets: 245 10*3/uL (ref 150–400)
RBC: 5.8 MIL/uL — ABNORMAL HIGH (ref 3.87–5.11)
RDW: 22.4 % — ABNORMAL HIGH (ref 11.5–15.5)
WBC: 12.6 10*3/uL — ABNORMAL HIGH (ref 4.0–10.5)
nRBC: 0.4 % — ABNORMAL HIGH (ref 0.0–0.2)

## 2021-08-23 LAB — GLUCOSE, CAPILLARY
Glucose-Capillary: 156 mg/dL — ABNORMAL HIGH (ref 70–99)
Glucose-Capillary: 155 mg/dL — ABNORMAL HIGH (ref 70–99)
Glucose-Capillary: 106 mg/dL — ABNORMAL HIGH (ref 70–99)
Glucose-Capillary: 92 mg/dL (ref 70–99)

## 2021-08-23 LAB — COMPREHENSIVE METABOLIC PANEL
ALT: 909 U/L — ABNORMAL HIGH (ref 0–44)
AST: 471 U/L — ABNORMAL HIGH (ref 15–41)
Albumin: <1.5 g/dL — ABNORMAL LOW (ref 3.5–5.0)
Alkaline Phosphatase: 207 U/L — ABNORMAL HIGH (ref 38–126)
Anion gap: 12 (ref 5–15)
BUN: 48 mg/dL — ABNORMAL HIGH (ref 8–23)
CO2: 21 mmol/L — ABNORMAL LOW (ref 22–32)
Calcium: 7.1 mg/dL — ABNORMAL LOW (ref 8.9–10.3)
Chloride: 105 mmol/L (ref 98–111)
Creatinine, Ser: 3.32 mg/dL — ABNORMAL HIGH (ref 0.44–1.00)
GFR, Estimated: 12 mL/min — ABNORMAL LOW (ref >60)
Glucose, Bld: 152 mg/dL — ABNORMAL HIGH (ref 70–99)
Potassium: 5 mmol/L (ref 3.5–5.1)
Sodium: 138 mmol/L (ref 135–145)
Total Bilirubin: 2.1 mg/dL — ABNORMAL HIGH (ref 0.3–1.2)
Total Protein: 4.1 g/dL — ABNORMAL LOW (ref 6.5–8.1)

## 2021-08-23 NOTE — Plan of Care (Signed)
Pt reports pain 0/10.  No interventions needed at this time.   Problem: Activity: Goal: Risk for activity intolerance will decrease Outcome: Not Progressing   Problem: Nutrition: Goal: Adequate nutrition will be maintained Outcome: Not Progressing

## 2021-08-23 NOTE — Progress Notes (Addendum)
HEMATOLOGY-ONCOLOGY PROGRESS NOTE  ASSESSMENT AND PLAN: Multiple liver masses CT abdomen/pelvis 07/11/2018, low-attenuation liver lesions incompletely characterized without contrast Ultrasound abdomen 07/10/2020-innumerable incompletely assessed hypoechoic masses in the liver MRI abdomen 07/29/2020- innumerable hypervascular liver masses, nodular liver contour, 1.5 cm left adrenal mass Biopsy liver lesion 08/08/2020-hepatocellular carcinoma, moderately differentiated Cycle 1 atezolizumab/bevacizumab 08/18/2020 Cycle 2 atezolizumab/bevacizumab 09/07/2020 Cycle 3 atezolizumab/bevacizumab 09/28/2020 Cycle 4 atezolizumab/bevacizumab 10/19/2020 MRI liver 11/07/2020-numerous hyperenhancing liver masses-stable to mildly improved compared to the MRI 07/29/2020 Cycle 5 atezolizumab/bevacizumab 11/10/2020 Cycle 6 atezolizumab/bevacizumab 12/04/2020 Cycle 7 atezolizumab/bevacizumab 12/26/2020 Cycle 8 atezolizumab/bevacizumab 01/16/2021 Cycle 9 atezolizumab/bevacizumab 02/06/2021 Cycle 10 atezolizumab/bevacizumab 02/26/2021 03/20/2021 MRI abdomen-right greater than left multifocal hepatocellular carcinoma, similar to exam 11/07/2020; new small right pleural effusion. Cycle 11 atezolizumab/bevacizumab 03/22/2021 Cycle 12 atezolizumab/bevacizumab 04/12/2021 HELD due to increased LFTs Cycle 13 bevacizumab 05/02/2021, atezolizumab held secondary to elevated liver enzymes Cycle 14 bevacizumab 05/23/2021, atezolizumab held secondary to elevated liver enzymes Cycle 15 bevacizumab 06/12/2021, atezolizumab held secondary to elevated liver enzymes Treatment held 07/03/2021 due to progressive rise in liver enzymes, referred for restaging MRI 07/08/2021 MRI abdomen-progression of widespread multifocal intrahepatic hepatocellular carcinoma, developing hepatic vein and IVC invasion and/or tumor thrombus. Lenvatinib 4 mg daily 07/22/2021 08/16/2021 lenvatinib placed on hold Coronary artery disease Atrial fibrillation-maintained on  anticoagulation History of basal cell and squamous cell skin cancers Diabetes Anorexia/weight loss Abdominal pain Urinary tract infection 08/30/2020 Hospital admission 08/15/2021 - AKI, mucositis  Ms. Erpelding appears unchanged.  She has persistent oral mucositis which has improved.  However, the performance status is not improved.  She has multiorgan failure with renal failure and liver enzyme abnormalities.  Poor prognosis has been discussed with the patient and her daughter.  The patient's daughter would like to talk with hospice to determine if she would like to bring her home with hospice versus placement at [facility].  Although, it is possible she may have a hospital death.  TOC referral has been placed to arrange for hospice liaison to talk with the patient and her family regarding their options.  There is no plan for aggressive intervention such as further evaluation of the renal failure.  Foley catheter order placed for comfort.  Recommend stopping all nonessential medications.  The patient's daughter would like her to continue on Eliquis due to risk of stroke.  Recommendations: 1.  Continue supportive care with IV hydration, pain medication, Chloraseptic/Magic mouthwash 2.  As needed narcotic available for pain. 3.  Referral to [facility], daughter would like to meet with the hospice team today.  4.  DNR/DNI. 5.  Order placed for Foley catheter for comfort. 6.  Discontinue all nonessential medications.  Mikey Bussing  Ms. Kuehnel was interviewed and examined.  The mucositis appears improved, but she continues to have a poor performance status.  She does not have significant pain.  I discussed the prognosis and disposition options with Ms. Hubka and her daughter.  We discussed remaining in the hospital for terminal care, home hospice, and residential hospice.  They plan to meet with the hospice liaison today and make a decision over the next 24 hours.  I discussed the  case with Dr. Pietro Cassis.  I was present for greater than 50% of today's visit.  I performed medical decision making.  SUBJECTIVE: Ms. Nienhuis is alert this morning.  She reports some discomfort over her bladder.  Feels like she needs to void.  Bladder scan performed and no significant urine noted.  Daughter is at the bedside.  Inquiring about Foley  catheter.  Would like to talk to hospice liaison today.  Oncology History  Hepatocellular carcinoma ([place])  08/12/2020 Initial Diagnosis   Hepatocellular carcinoma ([place])   08/12/2020 Cancer Staging   Staging form: Liver, AJCC 8th Edition - Pathologic: Stage II (pT2, pN0, cM0) - Signed by Ladell Pier, MD on 08/12/2020   08/18/2020 - 06/12/2021 Chemotherapy   Patient is on Treatment Plan : HEPATOCELLULAR Atezolizumab + Bevacizumab q21d      PHYSICAL EXAMINATION: HEENT: The mouth is dry, ulcer at the right buccal mucosa and right pharynx, no thrush Lungs: Distant breath sounds, no respiratory distress Cardiac: Tachycardia, irregular Abdomen: No hepatosplenomegaly, nontender Vascular: No leg edema Neurologic: More alert this morning, follows commands, moves all extremities to command   Vitals:   08/23/21 0323 08/23/21 0616  BP:  (!) 83/56  Pulse: (!) 122 79  Resp:  16  Temp:  97.9 F (36.6 C)  SpO2:  (!) 89%   Filed Weights   08/21/21 1405 08/22/21 0350 08/23/21 0616  Weight: 49.9 kg 61 kg 65.7 kg    Intake/Output from previous day: 07/12 0701 - 07/13 0700 In: 360 [P.O.:360] Out: -    LABORATORY DATA:  I have reviewed the data as listed    Latest Ref Rng & Units 08/23/2021    6:15 AM 08/22/2021    7:41 AM 08/21/2021    2:25 PM  CMP  Glucose 70 - 99 mg/dL 152  109    BUN 8 - 23 mg/dL 48  46    Creatinine 0.44 - 1.00 mg/dL 3.32  2.86    Sodium 135 - 145 mmol/L 138  140    Potassium 3.5 - 5.1 mmol/L 5.0  4.2  4.9   Chloride 98 - 111 mmol/L 105  109    CO2 22 - 32 mmol/L 21  20    Calcium 8.9 - 10.3 mg/dL 7.1  7.2    Total Protein  6.5 - 8.1 g/dL 4.1  4.3    Total Bilirubin 0.3 - 1.2 mg/dL 2.1  2.1    Alkaline Phos 38 - 126 U/L 207  213    AST 15 - 41 U/L 471  423    ALT 0 - 44 U/L 909  935      Lab Results  Component Value Date   WBC 12.6 (H) 08/23/2021   HGB 14.3 08/23/2021   HCT 46.6 (H) 08/23/2021   MCV 80.3 08/23/2021   PLT 245 08/23/2021   NEUTROABS 9.7 (H) 08/23/2021    Lab Results  Component Value Date   CEA 2.45 08/02/2020   [id number] 27 08/02/2020    DG Lumbar Spine 2-3 Views  Result Date: 08/21/2021 CLINICAL DATA:  Low back pain. EXAM: LUMBAR SPINE - 2-3 VIEW COMPARISON:  Reformats from abdominopelvic CT 07/10/2020 FINDINGS: Bones diffusely under mineralized. There are 5 non-rib-bearing lumbar vertebra with diminutive ribs at T12. Chronic L1 compression fracture, however slight progressive loss of height from prior CT. Undulation of T11 superior endplate may represent a Schmorl's node or mild compression fracture, not definitively seen on prior. Degenerative grade 1 anterolisthesis of L4 on L5. Diffuse degenerative disc disease. Mild multilevel facet hypertrophy. Aortic atherosclerosis. IMPRESSION: 1. Chronic L1 compression fracture with slight progressive loss of height from prior CT. 2. Undulation of T11 superior endplate may represent a Schmorl's node or mild compression fracture, not definitively seen on prior. Electronically Signed   By: Keith Rake M.D.   On: 08/21/2021 17:02   US RENAL  Result Date: 08/02/2021 CLINICAL DATA:  HCC, elevated creatinine EXAM: RENAL / URINARY TRACT ULTRASOUND COMPLETE COMPARISON:  MRI 07/08/2021. FINDINGS: Right Kidney: Renal measurements: 9.6 x 3.6 x 4.4 cm = volume: Knee 0.7 mL. There are two simple renal cysts, largest measuring 1.0 cm. Increased renal cortical echogenicity. Left Kidney: Renal measurements: 8.4 x 4.5 x 4.2 cm = volume: 81.7 mL. There is a simple small subcentimeter renal cyst. Increased renal cortical echogenicity. Bladder: Decompressed. Other:  Bilateral pleural effusions, right greater than left. Multiple liver lesions as seen on recent MRI. IMPRESSION: No hydronephrosis. Increased renal cortical echogenicity bilaterally, as can be seen in medical renal disease. Bilateral pleural effusions, right greater than left. Multiple liver lesions noted, as seen on prior MRI. Electronically Signed   By: Maurine Simmering M.D.   On: 08/02/2021 13:45     Future Appointments  Date Time Provider [place]  10/09/2021  2:40 PM Sherren Mocha, MD [facility]  10/29/2021 10:00 AM [facility] None  10/31/2021  3:00 PM Lillard Anes, MD [facility] None      LOS: 3 days

## 2021-08-23 NOTE — Progress Notes (Signed)
PROGRESS NOTE  Stacy [name]  DOB: 01-Jan-1927  PCP: Lillard Anes, MD IDP:[id number]  DOA: 09/03/2021  LOS: 3 days  Hospital Day: 4  Brief narrative: Stacy [name] is a 88 y.o. female with PMH significant for hepatocellular carcinoma on chemotherapy initially diagnosed 07/2020, CAD, HTN, history of CVA, persistent atrial fibrillation on Eliquis, CKD stage IV, chronic systolic congestive heart failure. Patient was receiving immunotherapy with lenvatinib for hepatocarcinoma under the care of oncology Dr. Benay Spice. Recently, she started having mucositis, significant oral pain, inability to eat or drink.  Also had diarrhea and dehydration. She was seen at cancer center and received IV fluid on 7/6, 7/7 and 7/8. On 7/10, patient presented to cancer center for progressively worsening symptoms and hence she was directly admitted inpatient to hospitalist service. Patient's symptoms mildly improved but she also started having multiorgan failure with renal failure, liver enzyme elevation.  Based on her poor performance status and poor chance of recovery, decision was made by family to make her comfort care.  Subjective: Patient was seen and examined this morning.  Patient is alert, awake, repeat her name but unable to answer any other questions.  Not in pain not in distress.  Daughter Rodena Piety and other family members at bedside . Discussed with Dr. Benay Spice this morning. Family made a decision to transition her to comfort care today.  Assessment and plan: Comfort care status -Initially admitted for dehydration due to mucositis, poor oral intake, diarrhea.  Symptoms improved but she started having multiorgan failure.  Totally anuric now.  Family made an appropriate decision to switch her to comfort care status. -Per requested by her daughter, we will continue other regular medicines as allowed by hospice care.  Severe dehydration Mucositis -Continue supportive care with IV hydration, pain meds,  Chloraseptic/Magic mouthwash, low-dose of narcotic.    Atrial fibrillation with RVR -On metoprolol   Hepatocellular carcinoma  Transaminitis -Liver enzymes worsening.  Continue to monitor  Acute on chronic kidney disease stage IV Metabolic acidosis -Patient presented with a creatinine of 3.40 with a serum bicarbonate 14, baseline 2.4-3.0. -Creatinine is no longer improving .Marland Kitchen Patient is anuric.  No need to insert a Foley catheter  Recent Labs    06/19/21 1056 07/03/21 1016 07/31/21 1300 08/01/21 1118 08/16/21 1346 08/17/21 1130 08/12/2021 2147 08/21/21 0643 08/22/21 0741 08/23/21 0615  BUN 16 27* 30* 26* 34* 38* 48* 48* 46* 48*  CREATININE 2.11* 2.14* 2.40* 2.26* 3.07* 2.97* 3.40* 3.04* 2.86* 3.32*  CO2 18* 21* 24 26 18* 17* 11* 14* 20* 21*     Dysphagia -SLP evaluation   Chronic systolic congestive heart failure, compensated Hx essential hypertension   CAD HLD   Type 2 diabetes mellitus  Goals of care   Code Status: DNR    Mobility: Limited mobility  Skin assessment:     Nutritional status:  Body mass index is 26.49 kg/m.  Nutrition Problem: Severe Malnutrition Etiology: chronic illness, cancer and cancer related treatments Signs/Symptoms: percent weight loss, energy intake < or equal to 75% for > or equal to 1 month, moderate fat depletion, moderate muscle depletion     Diet:  Diet Order             Diet full liquid Room service appropriate? Yes; Fluid consistency: Thin  Diet effective now                   DVT prophylaxis:     Antimicrobials: None Fluid: Sodium bicarb at 125 mill per  hour Consultants: Oncology Family Communication: Daughter at bedside  Status is: Inpatient  Continue in-hospital care because: Multiorgan failure.  Poor prognosis.  Pending hospice facility versus home hospice Level of care: Med-Surg   Dispo: The patient is from: Home              Anticipated d/c is to: Poor prognosis.  Comfort care.  Hospice  facility versus home hospice              patient currently is not medically stable to d/c.   Difficult to place patient No     Infusions:   sodium bicarbonate 150 mEq in sterile water 1,150 mL infusion Stopped (08/23/21 1556)    Scheduled Meds:  feeding supplement  237 mL Oral TID BM   insulin aspart  0-6 Units Subcutaneous TID WC   metoprolol tartrate  12.5 mg Oral BID    PRN meds: acetaminophen **OR** acetaminophen, diphenhydrAMINE, magic mouthwash, oxyCODONE, pantoprazole, phenol, traMADol   Antimicrobials: Anti-infectives (From admission, onward)    None       Objective: Vitals:   08/23/21 1037 08/23/21 1417  BP: (!) 87/52 (!) 90/57  Pulse: 72 90  Resp:  16  Temp:  (!) 97.5 F (36.4 C)  SpO2:      Intake/Output Summary (Last 24 hours) at 08/23/2021 1557 Last data filed at 08/22/2021 1700 Gross per 24 hour  Intake 240 ml  Output --  Net 240 ml    Filed Weights   08/21/21 1405 08/22/21 0350 08/23/21 0616  Weight: 49.9 kg 61 kg 65.7 kg   Weight change: 15.8 kg Body mass index is 26.49 kg/m.   Physical Exam: General exam: Elderly Caucasian female.  Not in physical distress Skin: No rashes, lesions or ulcers. HEENT: Atraumatic, normocephalic, no obvious bleeding Lungs: Diminished air entry both bases CVS: Tachycardic, irregular rhythm, no murmur GI/Abd soft, nontender, nondistended, bowel sound present CNS: Alert, awake, unable to follow commands.  Not restless or agitated. Psychiatry: Sad affect Extremities: No pedal edema, no calf tenderness  Data Review: I have personally reviewed the laboratory data and studies available.  F/u labs ordered Unresulted Labs (From admission, onward)    None       Signed, Terrilee Croak, MD [facility] 08/23/2021

## 2021-08-23 NOTE — Progress Notes (Addendum)
Currently noted   OVERNIGHT PROGRESS REPORT  Notified by RN for discovery of IV fluid running on patient.  This patient was reported to have had D5/NS w 24mq Potassium infusing at a rate of 125 mL/Hr since 1815 hrs on 08/22/2021 (scanned time) until approximately 0203 hrs on 08/23/2021. (Approximate time of discovery and reported)  Patient's previous potassium level was 4.2 mmol/L on 08/22/2021 at 0741 hrs. CMP has been ordered for this morning. (08/23/2021)  At this time correct fluid of sodium bicarbonate 150 mEq in sterile water at 125 mL/hr is running and verified by assigned RN. This is as ordered by attending physician earlier.  No ectopy other than previously noted is seen.  Patient is in no obvious or stated distress at this time. Correct reporting is completed.  Will follow-up based on laboratory value returns from ordered labs Telemetry monitoring is ongoing as previously ordered.  JGershon CullMSNA MSN ACNPC-AG Acute Care Nurse Practitioner [facility]

## 2021-08-23 NOTE — Progress Notes (Addendum)
Manufacturing engineer [facility]) Hospital Liaison Note  Referral received for patient/family interest in hospice services. Met with daughter Rodena Piety and friend Kenney Houseman to discuss home with hospice versus [employer]. Plan is for anita to make decision later today and we will follow up tomorrow.   Please call with any questions or concerns. Thank you  Roselee Nova, [place] Liaison  [phone]

## 2021-08-23 NOTE — Progress Notes (Signed)
   08/23/21 0323  Assess: MEWS Score  Pulse Rate (!) 122  Assess: MEWS Score  MEWS Temp 0  MEWS Systolic 1  MEWS Pulse 2  MEWS RR 0  MEWS LOC 0  MEWS Score 3  MEWS Score Color Yellow  Assess: if the MEWS score is Yellow or Red  Were vital signs taken at a resting state? Yes  Focused Assessment No change from prior assessment  Does the patient meet 2 or more of the SIRS criteria? No  MEWS guidelines implemented *See Row Information* Yes (NP on unit and made aware.)  Treat  MEWS Interventions Administered prn meds/treatments  Pain Scale 0-10  Take Vital Signs  Increase Vital Sign Frequency  Yellow: Q 2hr X 2 then Q 4hr X 2, if remains yellow, continue Q 4hrs  Escalate  MEWS: Escalate Yellow: discuss with charge nurse/RN and consider discussing with provider and RRT  Notify: Provider  Provider Name/Title J. Olena Heckle, NP.  Date Provider Notified 08/23/21  Time Provider Notified [phone]  Method of Notification Face-to-face  Notification Reason Other (Comment) (Pt on unit and addressing another concern re patient. Updated about elevated abnormal vitals)  Provider response In department;See new orders  Date of Provider Response 08/23/21  Time of Provider Response 0324  Assess: SIRS CRITERIA  SIRS Temperature  0  SIRS Pulse 1  SIRS Respirations  0  SIRS WBC 0  SIRS Score Sum  1

## 2021-08-23 NOTE — Progress Notes (Signed)
Foley insertion attempted by 2 different RNs.  Unable to complete insertion as cath met resistance during insertion.  Pt perineum is edematous and bladder scan was unable to detect bladder due to ascites.  MD to be notified of inability to insert foley cath at this time.

## 2021-08-23 NOTE — TOC Progression Note (Signed)
Transition of Care [facility]) - Progression Note    Patient Details  Name: Stacy [name] MRN: [id number] Date of Birth: [birth year]  [facility]) CM/SW Contact  Jodette Wik, Marjie Skiff, RN Phone Number: 08/23/2021, 11:26 AM  Clinical Narrative:    Referral made to [facility] to speak with daughter about home with hospice vs residential per provider request. Hospital death is also possible.

## 2021-08-27 ENCOUNTER — Encounter: Payer: Self-pay | Admitting: Family Medicine

## 2021-08-27 ENCOUNTER — Encounter: Payer: Self-pay | Admitting: Legal Medicine

## 2021-09-06 ENCOUNTER — Other Ambulatory Visit (HOSPITAL_COMMUNITY): Payer: Self-pay

## 2021-09-11 NOTE — Death Summary Note (Signed)
DEATH SUMMARY   Patient Details  Name: Stacy [name] MRN: [id number] DOB: Apr 17, 1926 WUJ:[id number], Zeb Comfort, MD Admission/Discharge Information   Admit Date:  September 15, 2021  Date of Death: Date of Death: 09-19-21  Time of Death: Time of Death: 0710  Length of Stay: 4   Principle Cause of death: Multiorgan failure due to cancer  Hospital Diagnoses: Principal Problem:   Diarrhea with dehydration Active Problems:   Essential hypertension   Coronary atherosclerosis   Paroxysmal atrial fibrillation (HCC)   Insulin dependent type 2 diabetes mellitus (HCC)   Hepatocellular carcinoma (HCC)   CRF (chronic renal failure), stage 4 (severe) (HCC)   History of CVA (cerebrovascular accident)   Mucositis   Hyperkalemia   Metabolic acidosis   Protein-calorie malnutrition, severe   Hospital Course: Stacy [name] was a 88 y.o. female with PMH significant for hepatocellular carcinoma on chemotherapy initially diagnosed 07/2020, CAD, HTN, history of CVA, persistent atrial fibrillation on Eliquis, CKD stage IV, chronic systolic congestive heart failure. Patient was receiving immunotherapy with lenvatinib for hepatocarcinoma under the care of oncology Dr. Benay Spice. Recently, she started having mucositis, significant oral pain, inability to eat or drink.  Also had diarrhea and dehydration. She was seen at cancer center and received IV fluid on 7/6, 7/7 and 7/8. On 2022/09/16, patient presented to cancer center for progressively worsening symptoms and hence she was directly admitted inpatient to hospitalist service. Patient's symptoms mildly improved but she also started having multiorgan failure with renal failure, liver enzyme elevation.  Based on her poor performance status and poor chance of recovery, decision was made by family to make her comfort care. Patient passed away on Sep 20, 2022 at 7:10 a.m.      The results of significant diagnostics from this hospitalization (including imaging, microbiology,  ancillary and laboratory) are listed below for reference.   Significant Diagnostic Studies: DG Lumbar Spine 2-3 Views  Result Date: 08/21/2021 CLINICAL DATA:  Low back pain. EXAM: LUMBAR SPINE - 2-3 VIEW COMPARISON:  Reformats from abdominopelvic CT 07/10/2020 FINDINGS: Bones diffusely under mineralized. There are 5 non-rib-bearing lumbar vertebra with diminutive ribs at T12. Chronic L1 compression fracture, however slight progressive loss of height from prior CT. Undulation of T11 superior endplate may represent a Schmorl's node or mild compression fracture, not definitively seen on prior. Degenerative grade 1 anterolisthesis of L4 on L5. Diffuse degenerative disc disease. Mild multilevel facet hypertrophy. Aortic atherosclerosis. IMPRESSION: 1. Chronic L1 compression fracture with slight progressive loss of height from prior CT. 2. Undulation of T11 superior endplate may represent a Schmorl's node or mild compression fracture, not definitively seen on prior. Electronically Signed   By: Keith Rake M.D.   On: 08/21/2021 17:02   US RENAL  Result Date: 08/02/2021 CLINICAL DATA:  HCC, elevated creatinine EXAM: RENAL / URINARY TRACT ULTRASOUND COMPLETE COMPARISON:  MRI 07/08/2021. FINDINGS: Right Kidney: Renal measurements: 9.6 x 3.6 x 4.4 cm = volume: Knee 0.7 mL. There are two simple renal cysts, largest measuring 1.0 cm. Increased renal cortical echogenicity. Left Kidney: Renal measurements: 8.4 x 4.5 x 4.2 cm = volume: 81.7 mL. There is a simple small subcentimeter renal cyst. Increased renal cortical echogenicity. Bladder: Decompressed. Other: Bilateral pleural effusions, right greater than left. Multiple liver lesions as seen on recent MRI. IMPRESSION: No hydronephrosis. Increased renal cortical echogenicity bilaterally, as can be seen in medical renal disease. Bilateral pleural effusions, right greater than left. Multiple liver lesions noted, as seen on prior MRI. Electronically Signed   By: Edison Nasuti  Chancy Milroy M.D.   On: 08/02/2021 13:45    Microbiology: No results found for this or any previous visit (from the past 240 hour(s)).  Time spent: 25 minutes  Signed: Terrilee Croak, MD 09-09-2021

## 2021-09-11 NOTE — Progress Notes (Signed)
Patient passed this morning at 0710 AM with Dr. Benay Spice and previous nurse  Raeanne Gathers RN at bedside. Dr. Benay Spice pronounced time of death. Daughter Luberta Mutter at bedside during passing. Supportive presence provided to patient and family.  Daughter stated [facility] in [place] is their choice. Attending physician Dr. Pietro Cassis notified of passing.  [facility] contacted, patient not a donor candidate.  Daughter at bedside is waiting for additional family members to come to bedside prior to having patient transported to [place].

## 2021-09-11 NOTE — Progress Notes (Signed)
Patient family finished visiting at bedside.  Post mortem care completed.  Patient's body transported to [facility] by [employer].

## 2021-09-11 DEATH — deceased

## 2021-09-12 ENCOUNTER — Ambulatory Visit: Payer: Medicare Other | Admitting: Legal Medicine

## 2021-10-01 ENCOUNTER — Ambulatory Visit: Payer: Medicare Other | Admitting: Cardiovascular Disease

## 2021-10-09 ENCOUNTER — Ambulatory Visit: Payer: Medicare Other | Admitting: Cardiovascular Disease

## 2021-10-11 ENCOUNTER — Other Ambulatory Visit (HOSPITAL_COMMUNITY): Payer: Self-pay

## 2021-10-29 ENCOUNTER — Other Ambulatory Visit: Payer: Medicare Other

## 2021-10-31 ENCOUNTER — Ambulatory Visit: Payer: Medicare Other | Admitting: Legal Medicine

## 2022-07-05 IMAGING — MR MR ABDOMEN WO/W CM
12 of 17 series · 26 of 48 positions shown · IV contrast (11ml Multihance)
Comparison: Clinic note of 02/26/2021.  Prior MRI of 11/07/2020

CLINICAL DATA: Restaging of hepatocellular carcinoma. Status post
immunotherapy.

EXAM:
MRI ABDOMEN WITHOUT AND WITH CONTRAST
TECHNIQUE: Multiplanar multisequence MR imaging of the abdomen was performed
both before and after the administration of intravenous contrast.
CONTRAST:  11mL MULTIHANCE GADOBENATE DIMEGLUMINE 529 MG/ML IV SOLN

[Series 3: cor haste · coronal · 5.0mm · 0.68mm/px · 1 of 32 slices shown]
[im 1/32]
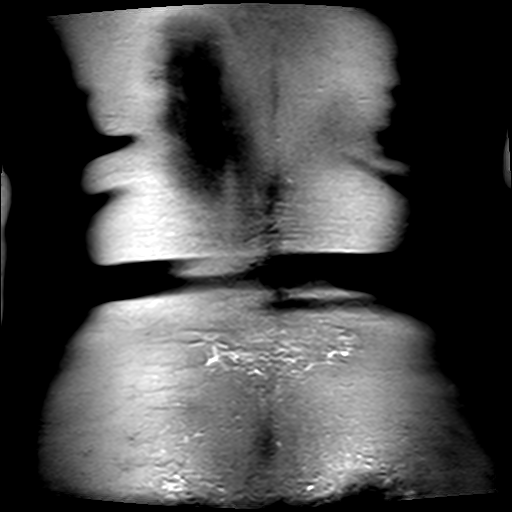

[Series 4: axial haste · axial · 6.0mm · 0.68mm/px · 1 of 33 slices shown]
[im 1/33]
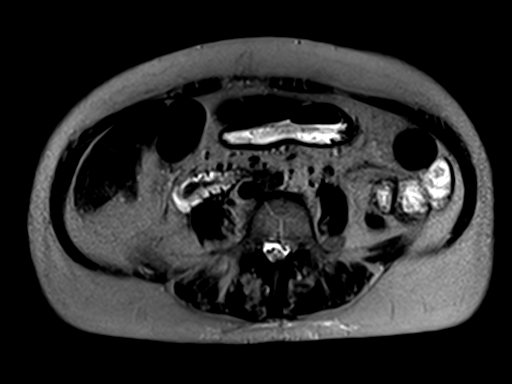

[Series 5: T1 · axial · 6.0mm · 0.68mm/px · z∈[-85,+140]mm · 2 of 70 slices shown]
[im 1/70]
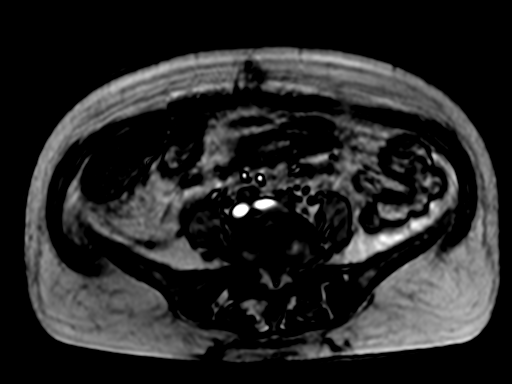
[im 70/70]
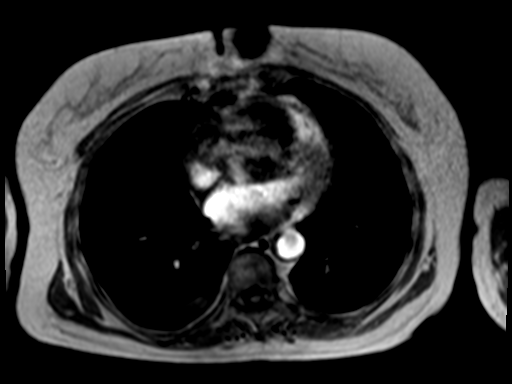

[Series 6: bSSFP · axial · 4.0mm · 0.68mm/px · 1 of 61 slices shown]
[im 1/61]
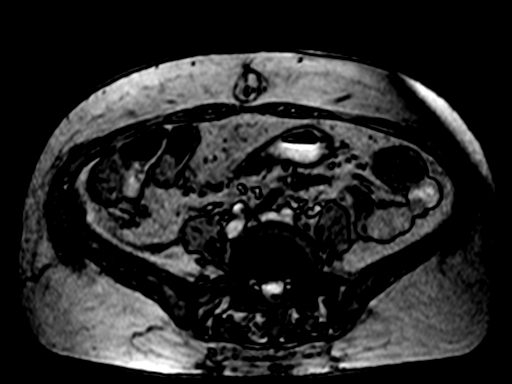

[Series 7: T2 fat-sat · axial · 6.0mm · 1.09mm/px · 1 of 32 slices shown]
[im 1/32]
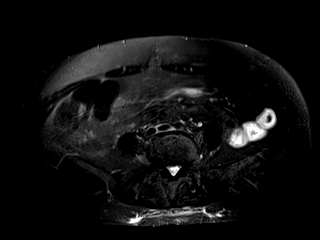

[Series 8: [id number]_trig · axial · 6.0mm · 1.82mm/px · z∈[-84,+139]mm · 3 of 95 slices shown]
[im 1/95]
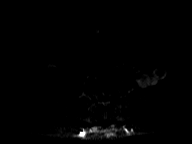
[im 48/95]
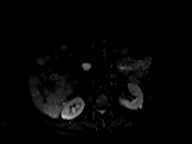
[im 95/95]
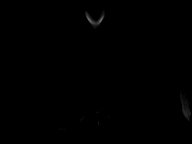

[Series 9: [id number]_trig_adc · axial · 6.0mm · 1.82mm/px · 1 of 32 slices shown]
[im 1/32]
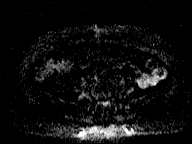

[Series 10: T1 dynamic · axial · non-contrast · 2.5mm · 0.74mm/px · z∈[-90,+147]mm · 3 of 96 slices shown]
[im 1/96]
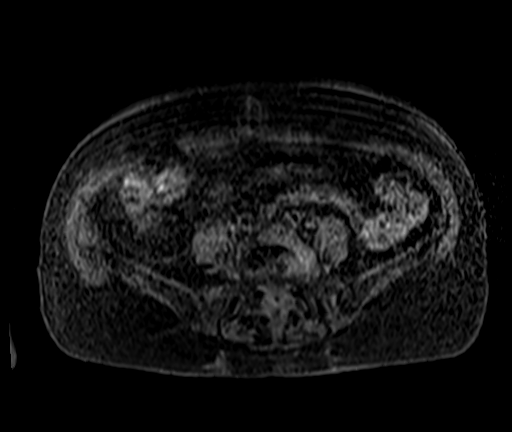
[im 48/96]
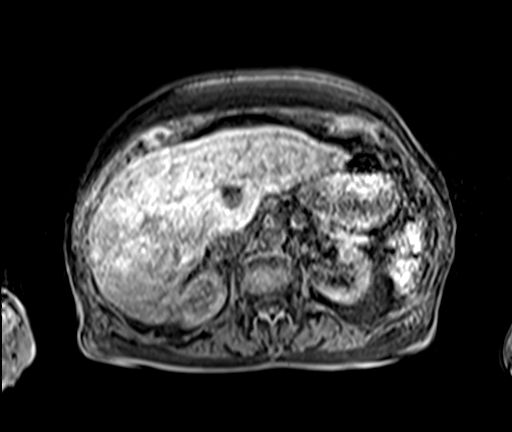
[im 96/96]
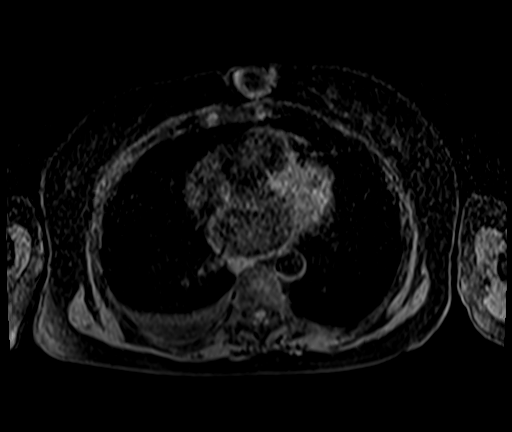

[Series 11: T1 dynamic post-contrast · axial · 2.5mm · 0.74mm/px · z∈[-90,+147]mm · 4 of 96 slices shown (1 of 4)]
[im 1/96]
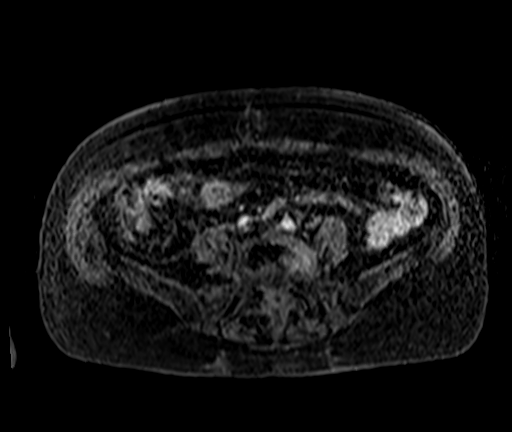
[im 32/96]
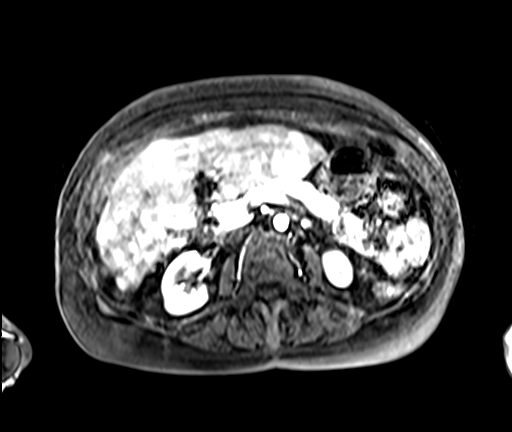
[im 64/96]
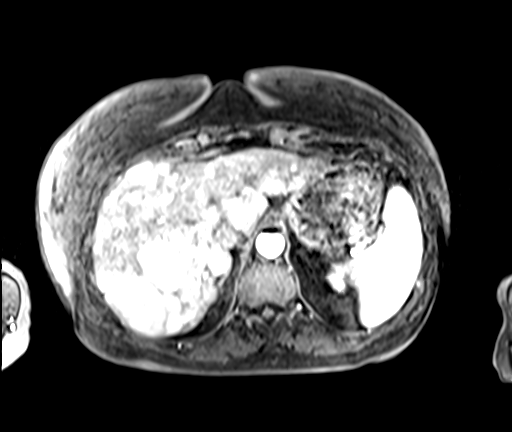
[im 96/96]
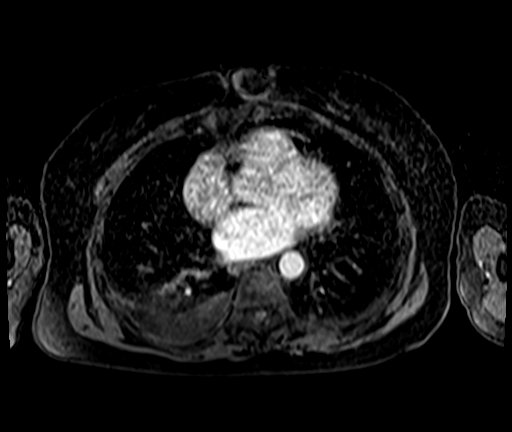

[Series 12: T1 dynamic post-contrast · axial · 2.5mm · 0.74mm/px · z∈[-90,+147]mm · 4 of 96 slices shown (2 of 4)]
[im 1/96]
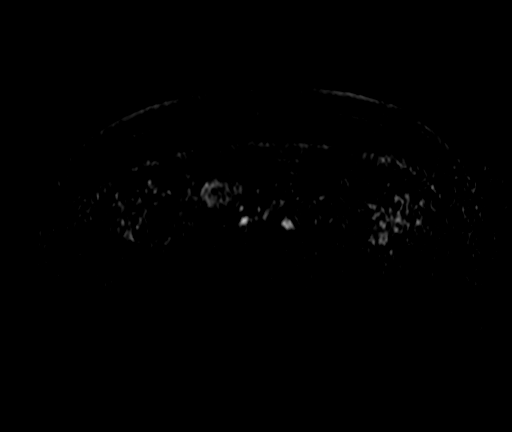
[im 32/96]
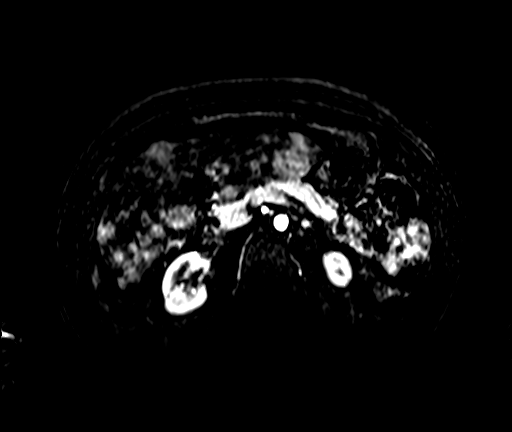
[im 64/96]
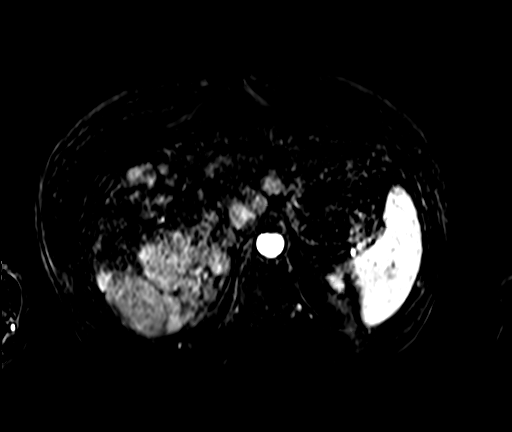
[im 96/96]
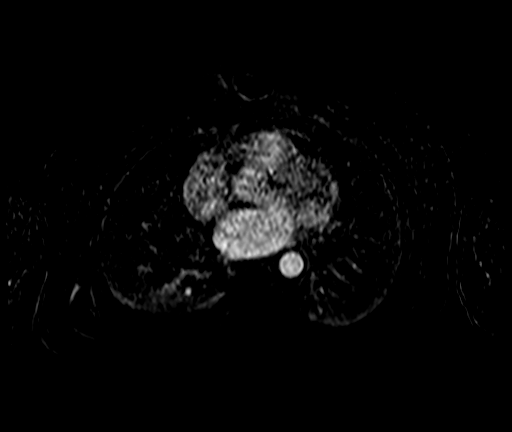

[Series 13: T1 dynamic post-contrast · axial · 2.5mm · 0.74mm/px · z∈[-90,+147]mm · 4 of 96 slices shown (3 of 4)]
[im 1/96]
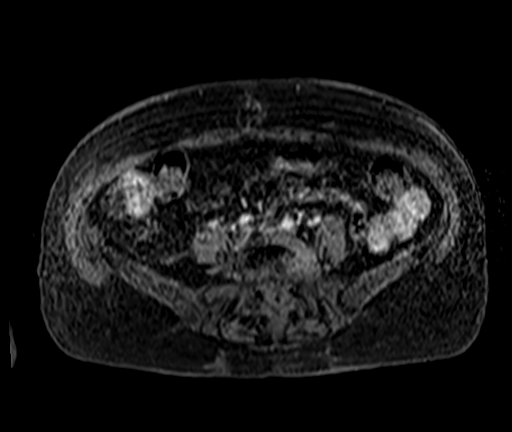
[im 32/96]
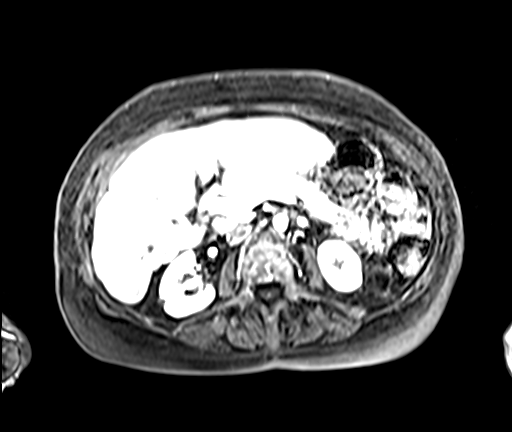
[im 64/96]
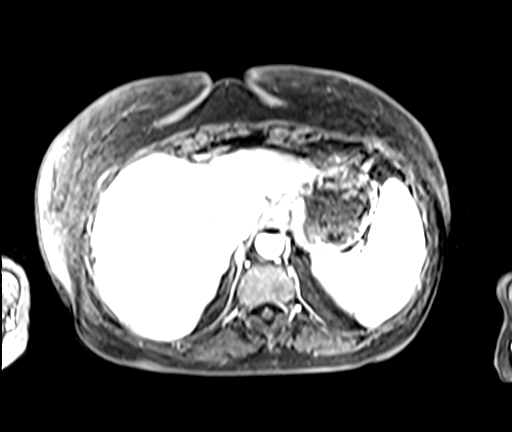
[im 96/96]
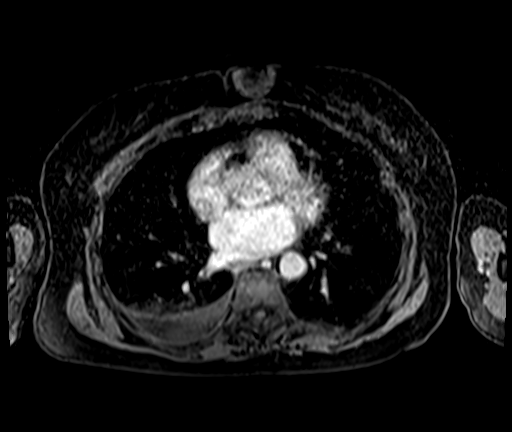

[Series 14: T1 dynamic post-contrast · axial · 2.5mm · 0.74mm/px · 1 of 96 slices shown (4 of 4)]
[im 1/96]
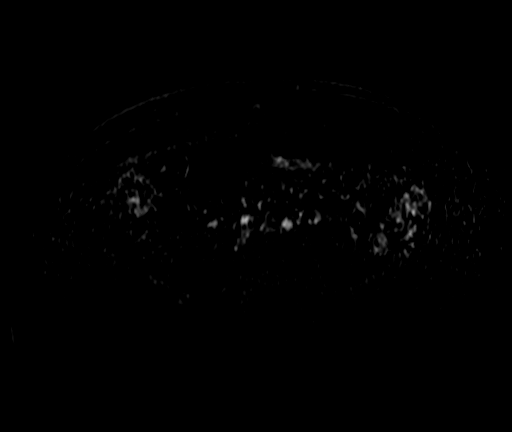

[26 of 48 positions shown; findings below may reference images not displayed]

FINDINGS: Mild motion degradation, especially involving the pre and
postcontrast dynamic images.

Lower chest: A small right pleural effusion is new. Mild
cardiomegaly.

Hepatobiliary: Scattered hepatic cysts.

Again identified are multiple hypervascular lesions throughout both
lobes of the liver. Especially given the motion on the dynamic
postcontrast images, these are most directly compared on
diffusion-weighted imaging.:

A dominant conglomerate of masses involving the posterior right
hepatic lobe measures 10.7 x 7.2 cm on 73/8. Similar to 10.6 x
cm on 93/6 of the prior exam (when remeasured).

An index segment 3 mass measures 3.7 cm on 53/8 versus 3.9 cm on the
prior exam (when remeasured).

An index segment 5 lesion measures 2.4 cm on 55/8 and is similar to
2.3 cm on the prior exam (when remeasured).

8 mm gallstone without specific evidence of acute cholecystitis. No
biliary duct dilatation.

Pancreas:  Normal, without mass or ductal dilatation.

Spleen:  Normal in size, without focal abnormality.

Adrenals/Urinary Tract: Similar size of a left adrenal nodule
including at 1.4 cm on [DATE]. Likely an adenoma, but indeterminate
based on in/out of phase imaging.

Normal right adrenal gland. Tiny bilateral renal lesions which are
likely cysts. No hydronephrosis.

Stomach/Bowel: Normal stomach and small bowel. Large colonic stool
burden.

Vascular/Lymphatic: Advanced aortic and branch vessel
atherosclerosis. Portal and splenic veins are patent. No abdominal
adenopathy.

Other:  No ascites.

Musculoskeletal: Superior endplate L1 compression deformity is again
identified.
IMPRESSION: 1. Motion degradation, especially involving the pre and postcontrast
dynamic images.
2. Diffuse, right greater than left multifocal hepatocellular
carcinoma, felt to be similar to the 11/07/2020 exam.
3. New small right pleural effusion.
4. Cholelithiasis.
5. Similar left adrenal nodule, favoring an adenoma.
